# Patient Record
Sex: Female | Born: 1935
Health system: Southern US, Community
[De-identification: ages and names within clinical notes are randomized; demographics above are authoritative.]

## PROBLEM LIST (undated history)

## (undated) DIAGNOSIS — F0390 Unspecified dementia without behavioral disturbance: Secondary | ICD-10-CM

## (undated) DIAGNOSIS — I10 Essential (primary) hypertension: Secondary | ICD-10-CM

## (undated) DIAGNOSIS — F329 Major depressive disorder, single episode, unspecified: Secondary | ICD-10-CM

## (undated) DIAGNOSIS — H409 Unspecified glaucoma: Secondary | ICD-10-CM

## (undated) DIAGNOSIS — E785 Hyperlipidemia, unspecified: Secondary | ICD-10-CM

## (undated) DIAGNOSIS — F32A Depression, unspecified: Secondary | ICD-10-CM

## (undated) DIAGNOSIS — M810 Age-related osteoporosis without current pathological fracture: Secondary | ICD-10-CM

## (undated) HISTORY — PX: ABDOMINAL HYSTERECTOMY: SHX81

## (undated) HISTORY — PX: ANKLE ARTHROSCOPY: SUR85

## (undated) HISTORY — PX: EYE SURGERY: SHX253

## (undated) HISTORY — PX: CHOLECYSTECTOMY: SHX55

## (undated) HISTORY — PX: RECTOCELE REPAIR: SHX761

---

## 2000-06-02 ENCOUNTER — Other Ambulatory Visit: Admission: RE | Admit: 2000-06-02 | Discharge: 2000-06-02 | Payer: Self-pay | Admitting: Endocrinology

## 2001-07-06 ENCOUNTER — Other Ambulatory Visit: Admission: RE | Admit: 2001-07-06 | Discharge: 2001-07-06 | Payer: Self-pay | Admitting: Endocrinology

## 2008-08-04 ENCOUNTER — Encounter: Admission: RE | Admit: 2008-08-04 | Discharge: 2008-08-04 | Payer: Self-pay | Admitting: Endocrinology

## 2008-10-20 ENCOUNTER — Ambulatory Visit (HOSPITAL_COMMUNITY): Admission: RE | Admit: 2008-10-20 | Discharge: 2008-10-21 | Payer: Self-pay | Admitting: Surgery

## 2008-10-20 ENCOUNTER — Encounter (INDEPENDENT_AMBULATORY_CARE_PROVIDER_SITE_OTHER): Payer: Self-pay | Admitting: Surgery

## 2010-01-24 ENCOUNTER — Ambulatory Visit (HOSPITAL_COMMUNITY): Admission: RE | Admit: 2010-01-24 | Discharge: 2010-01-24 | Payer: Self-pay | Admitting: Orthopaedic Surgery

## 2010-04-27 ENCOUNTER — Emergency Department (HOSPITAL_COMMUNITY): Admission: EM | Admit: 2010-04-27 | Discharge: 2010-04-27 | Payer: Self-pay | Admitting: Emergency Medicine

## 2011-03-25 NOTE — Op Note (Signed)
Kendra Smith, Kendra Smith            ACCOUNT NO.:  [id number]   MEDICAL RECORD NO.:  [id number]          PATIENT TYPE:  AMB   LOCATION:  DAY                          FACILITY:  [facility]   PHYSICIAN:  Velora Heckler, MD      DATE OF BIRTH:  1926-07-17   DATE OF PROCEDURE:  10/20/2008  DATE OF DISCHARGE:                               OPERATIVE REPORT   PREOPERATIVE DIAGNOSIS:  Symptomatic cholelithiasis.   POSTOPERATIVE DIAGNOSES:  Symptomatic cholelithiasis.   PROCEDURE:  Laparoscopic cholecystectomy with intraoperative  cholangiography.   SURGEON:  Velora Heckler, MD, FACS   ASSISTANT:  Harriette Bouillon, MD, FACS   ANESTHESIA:  General per Dr. Sherrian Divers.   ESTIMATED BLOOD LOSS:  Minimal.   PREPARATION:  Betadine.   COMPLICATIONS:  None.   INDICATIONS:  The patient is a 77 year old white female from [place],  [place].  She presents with a 82-month history of intermittent  abdominal pain.  This has been associated with nausea and vomiting.  The  patient underwent evaluation by Dr. Adela Lank.  This included an  abdominal ultrasound on August 04, 2008 at [facility],  documenting multiple gallstones.  The patient now presents for  cholecystectomy.   BODY OF REPORT:  Procedure was done in OR #6 at the [facility].  The patient is brought to the operating room,  placed in supine position on the operating room table.  Following  administration of general anesthesia the patient is positioned and then  prepped and draped in usual strict aseptic fashion.  After ascertaining  that an adequate level of anesthesia been achieved, an infraumbilical  incision is made in the midline with a #15 blade.  Dissection is carried  down to the fascia.  Fascia is incised in the midline and the peritoneal  cavity is entered cautiously.  A 0 Vicryl pursestring suture is placed  in the fascia.  A Hasson cannula is introduced and secured with the  pursestring  suture.  The abdomen is insufflated with carbon dioxide.  Laparoscope is introduced and the abdomen explored.  Operative ports are  placed along the right costal margin in the midline, midclavicular line,  and anterior axillary line.  Fundus of the gallbladder is grasped and  retracted cephalad.  Omental adhesions and duodenal adhesions to the  undersurface of the gallbladder are taken down gently and the  electrocautery used for hemostasis.  The entire undersurface of the  gallbladder is cleared and exposed widely.  Peritoneum is incised at the  neck of the gallbladder.  Cystic duct is dissected out.  A clip is  placed at the neck of the gallbladder.  The cystic duct is incised.  A  Cook cholangiography catheter is introduced through a stab wound in the  right upper quadrant.  It is inserted into the cystic duct and secured  with a Ligaclip.  Using C-arm fluoroscopy, real-time cholangiography is  performed.  There is rapid filling of a normal-caliber common bile duct.  There is free flow distally into the duodenum.  There is reflux of  contrast into the right and  left hepatic ductal systems.  Clip is  withdrawn and Cook catheter is removed from the peritoneal cavity.   Cystic duct is doubly clipped and divided.  Branches of the cystic  artery are dissected out, doubly clipped and divided.  Gallbladder is  then excised from the gallbladder bed using the hook electrocautery for  hemostasis.  Gallbladder is completely excised and extracted through the  umbilical port without difficulty.  It contains multiple gallstones.  Gallbladder is submitted to pathology for review.   0-vicryl pursestring suture is tied securely at the umbilicus.  Right  upper quadrant is copiously irrigated with warm saline which is  evacuated.  Good hemostasis is noted.  Pneumoperitoneum is released and  ports are removed under direct vision.  Good hemostasis is noted at all  port sites.  Port sites are anesthetized  with local anesthetic.  Skin  wounds are closed with interrupted 4-0 Vicryl subcuticular sutures.  Wounds are washed and dried and Benzoin and Steri-Strips are applied.  Sterile dressings are applied.  The patient is awakened from anesthesia  and brought to the recovery room in stable condition.  The patient  tolerated the procedure well.      Velora Heckler, MD  Electronically Signed     TMG/MEDQ  D:  10/20/2008  T:  10/20/2008  Job:  [id number]   cc:   Brooke Bonito, M.D.  Fax: [phone]

## 2011-06-20 ENCOUNTER — Emergency Department (HOSPITAL_COMMUNITY): Payer: Medicare Other

## 2011-06-20 ENCOUNTER — Inpatient Hospital Stay (HOSPITAL_COMMUNITY)
Admission: EM | Admit: 2011-06-20 | Discharge: 2011-06-25 | DRG: 312 | Disposition: A | Discharge status: Skilled Nursing Facility | Payer: Medicare Other | Attending: Internal Medicine | Admitting: Internal Medicine

## 2011-06-20 DIAGNOSIS — R197 Diarrhea, unspecified: Secondary | ICD-10-CM | POA: Diagnosis present

## 2011-06-20 DIAGNOSIS — Z23 Encounter for immunization: Secondary | ICD-10-CM

## 2011-06-20 DIAGNOSIS — S82409A Unspecified fracture of shaft of unspecified fibula, initial encounter for closed fracture: Secondary | ICD-10-CM

## 2011-06-20 DIAGNOSIS — Z79899 Other long term (current) drug therapy: Secondary | ICD-10-CM

## 2011-06-20 DIAGNOSIS — M1990 Unspecified osteoarthritis, unspecified site: Secondary | ICD-10-CM | POA: Diagnosis present

## 2011-06-20 DIAGNOSIS — M6282 Rhabdomyolysis: Secondary | ICD-10-CM | POA: Diagnosis present

## 2011-06-20 DIAGNOSIS — W19XXXA Unspecified fall, initial encounter: Secondary | ICD-10-CM | POA: Diagnosis present

## 2011-06-20 DIAGNOSIS — M810 Age-related osteoporosis without current pathological fracture: Secondary | ICD-10-CM | POA: Diagnosis present

## 2011-06-20 DIAGNOSIS — S8253XA Displaced fracture of medial malleolus of unspecified tibia, initial encounter for closed fracture: Secondary | ICD-10-CM | POA: Diagnosis present

## 2011-06-20 DIAGNOSIS — T07XXXA Unspecified multiple injuries, initial encounter: Secondary | ICD-10-CM

## 2011-06-20 DIAGNOSIS — E860 Dehydration: Secondary | ICD-10-CM | POA: Diagnosis present

## 2011-06-20 DIAGNOSIS — N318 Other neuromuscular dysfunction of bladder: Secondary | ICD-10-CM | POA: Diagnosis present

## 2011-06-20 DIAGNOSIS — Z7982 Long term (current) use of aspirin: Secondary | ICD-10-CM

## 2011-06-20 DIAGNOSIS — E785 Hyperlipidemia, unspecified: Secondary | ICD-10-CM | POA: Diagnosis present

## 2011-06-20 DIAGNOSIS — S82839A Other fracture of upper and lower end of unspecified fibula, initial encounter for closed fracture: Secondary | ICD-10-CM | POA: Diagnosis present

## 2011-06-20 DIAGNOSIS — H409 Unspecified glaucoma: Secondary | ICD-10-CM | POA: Diagnosis present

## 2011-06-20 DIAGNOSIS — I4891 Unspecified atrial fibrillation: Secondary | ICD-10-CM | POA: Diagnosis present

## 2011-06-20 DIAGNOSIS — F341 Dysthymic disorder: Secondary | ICD-10-CM | POA: Diagnosis present

## 2011-06-20 DIAGNOSIS — F39 Unspecified mood [affective] disorder: Secondary | ICD-10-CM | POA: Diagnosis present

## 2011-06-20 DIAGNOSIS — H269 Unspecified cataract: Secondary | ICD-10-CM | POA: Diagnosis present

## 2011-06-20 DIAGNOSIS — R55 Syncope and collapse: Principal | ICD-10-CM | POA: Diagnosis present

## 2011-06-20 DIAGNOSIS — I10 Essential (primary) hypertension: Secondary | ICD-10-CM | POA: Diagnosis present

## 2011-06-20 DIAGNOSIS — R7989 Other specified abnormal findings of blood chemistry: Secondary | ICD-10-CM

## 2011-06-20 DIAGNOSIS — K5900 Constipation, unspecified: Secondary | ICD-10-CM | POA: Diagnosis not present

## 2011-06-20 DIAGNOSIS — F0390 Unspecified dementia without behavioral disturbance: Secondary | ICD-10-CM | POA: Diagnosis present

## 2011-06-20 LAB — URINALYSIS, ROUTINE W REFLEX MICROSCOPIC
Glucose, UA: NEGATIVE mg/dL
Ketones, ur: 40 mg/dL — AB
Leukocytes, UA: NEGATIVE
Nitrite: NEGATIVE
Protein, ur: 30 mg/dL — AB
Specific Gravity, Urine: 1.021 (ref 1.005–1.030)
Urobilinogen, UA: 0.2 mg/dL (ref 0.0–1.0)
pH: 5 (ref 5.0–8.0)

## 2011-06-20 LAB — PROTIME-INR
INR: 1.18 (ref 0.00–1.49)
Prothrombin Time: 15.2 seconds (ref 11.6–15.2)

## 2011-06-20 LAB — DIFFERENTIAL
Basophils Absolute: 0 10*3/uL (ref 0.0–0.1)
Basophils Relative: 0 % (ref 0–1)
Eosinophils Absolute: 0 10*3/uL (ref 0.0–0.7)
Eosinophils Relative: 0 % (ref 0–5)
Lymphocytes Relative: 7 % — ABNORMAL LOW (ref 12–46)
Lymphs Abs: 1.1 10*3/uL (ref 0.7–4.0)
Monocytes Absolute: 1 10*3/uL (ref 0.1–1.0)
Monocytes Relative: 7 % (ref 3–12)
Neutro Abs: 12.4 10*3/uL — ABNORMAL HIGH (ref 1.7–7.7)
Neutrophils Relative %: 86 % — ABNORMAL HIGH (ref 43–77)

## 2011-06-20 LAB — POCT I-STAT, CHEM 8
BUN: 24 mg/dL — ABNORMAL HIGH (ref 6–23)
Calcium, Ion: 1.22 mmol/L (ref 1.12–1.32)
Chloride: 102 mEq/L (ref 96–112)
Creatinine, Ser: 1 mg/dL (ref 0.50–1.10)
Glucose, Bld: 122 mg/dL — ABNORMAL HIGH (ref 70–99)
HCT: 40 % (ref 36.0–46.0)
Hemoglobin: 13.6 g/dL (ref 12.0–15.0)
Potassium: 4.1 mEq/L (ref 3.5–5.1)
Sodium: 137 mEq/L (ref 135–145)
TCO2: 26 mmol/L (ref 0–100)

## 2011-06-20 LAB — CBC
HCT: 37.8 % (ref 36.0–46.0)
Hemoglobin: 13.4 g/dL (ref 12.0–15.0)
MCH: 31.8 pg (ref 26.0–34.0)
MCHC: 35.4 g/dL (ref 30.0–36.0)
MCV: 89.6 fL (ref 78.0–100.0)
Platelets: 211 10*3/uL (ref 150–400)
RBC: 4.22 MIL/uL (ref 3.87–5.11)
RDW: 12.8 % (ref 11.5–15.5)
WBC: 14.5 10*3/uL — ABNORMAL HIGH (ref 4.0–10.5)

## 2011-06-20 LAB — TROPONIN I: Troponin I: <0.3 ng/mL (ref <0.30)

## 2011-06-20 LAB — URINE MICROSCOPIC-ADD ON

## 2011-06-20 LAB — POCT I-STAT TROPONIN I: Troponin i, poc: 0.11 ng/mL (ref 0.00–0.08)

## 2011-06-20 LAB — CK TOTAL AND CKMB (NOT AT ARMC)
CK, MB: 13.9 ng/mL (ref 0.3–4.0)
Relative Index: 0.7 (ref 0.0–2.5)
Total CK: 1856 U/L — ABNORMAL HIGH (ref 7–177)

## 2011-06-20 LAB — GLUCOSE, CAPILLARY: Glucose-Capillary: 118 mg/dL — ABNORMAL HIGH (ref 70–99)

## 2011-06-20 MED ORDER — IOHEXOL 300 MG/ML  SOLN
100.0000 mL | Freq: Once | INTRAMUSCULAR | Status: AC | PRN
  Administered 2011-06-20: 80 mL via INTRAVENOUS

## 2011-06-21 LAB — COMPREHENSIVE METABOLIC PANEL
ALT: 21 U/L (ref 0–35)
AST: 36 U/L (ref 0–37)
Albumin: 2.6 g/dL — ABNORMAL LOW (ref 3.5–5.2)
Alkaline Phosphatase: 49 U/L (ref 39–117)
BUN: 24 mg/dL — ABNORMAL HIGH (ref 6–23)
CO2: 25 mEq/L (ref 19–32)
Calcium: 8.5 mg/dL (ref 8.4–10.5)
Chloride: 106 mEq/L (ref 96–112)
Creatinine, Ser: 1.06 mg/dL (ref 0.50–1.10)
GFR calc Af Amer: >60 mL/min (ref >60)
GFR calc non Af Amer: 50 mL/min — ABNORMAL LOW (ref >60)
Glucose, Bld: 105 mg/dL — ABNORMAL HIGH (ref 70–99)
Potassium: 3.7 mEq/L (ref 3.5–5.1)
Sodium: 141 mEq/L (ref 135–145)
Total Bilirubin: 0.5 mg/dL (ref 0.3–1.2)
Total Protein: 5.8 g/dL — ABNORMAL LOW (ref 6.0–8.3)

## 2011-06-21 LAB — CBC
HCT: 35.7 % — ABNORMAL LOW (ref 36.0–46.0)
Hemoglobin: 12.1 g/dL (ref 12.0–15.0)
MCH: 30.6 pg (ref 26.0–34.0)
MCHC: 33.9 g/dL (ref 30.0–36.0)
MCV: 90.4 fL (ref 78.0–100.0)
Platelets: 188 10*3/uL (ref 150–400)
RBC: 3.95 MIL/uL (ref 3.87–5.11)
RDW: 13.1 % (ref 11.5–15.5)
WBC: 10.3 10*3/uL (ref 4.0–10.5)

## 2011-06-21 LAB — CK: Total CK: 593 U/L — ABNORMAL HIGH (ref 7–177)

## 2011-06-22 DIAGNOSIS — I059 Rheumatic mitral valve disease, unspecified: Secondary | ICD-10-CM

## 2011-06-22 DIAGNOSIS — R55 Syncope and collapse: Secondary | ICD-10-CM

## 2011-06-22 LAB — URINE MICROSCOPIC-ADD ON

## 2011-06-22 LAB — URINE CULTURE
Colony Count: >=100000
Culture  Setup Time: 201208101459

## 2011-06-22 LAB — URINALYSIS, ROUTINE W REFLEX MICROSCOPIC
Bilirubin Urine: NEGATIVE
Glucose, UA: NEGATIVE mg/dL
Ketones, ur: NEGATIVE mg/dL
Nitrite: POSITIVE — AB
Protein, ur: NEGATIVE mg/dL
Specific Gravity, Urine: 1.015 (ref 1.005–1.030)
Urobilinogen, UA: 1 mg/dL (ref 0.0–1.0)
pH: 5.5 (ref 5.0–8.0)

## 2011-06-22 LAB — TSH: TSH: 2.481 u[IU]/mL (ref 0.350–4.500)

## 2011-06-22 LAB — HEPARIN LEVEL (UNFRACTIONATED): Heparin Unfractionated: 0.3 [IU]/mL (ref 0.30–0.70)

## 2011-06-23 LAB — CBC
HCT: 30.7 % — ABNORMAL LOW (ref 36.0–46.0)
Hemoglobin: 10.4 g/dL — ABNORMAL LOW (ref 12.0–15.0)
MCH: 31.4 pg (ref 26.0–34.0)
MCHC: 33.9 g/dL (ref 30.0–36.0)
MCV: 92.7 fL (ref 78.0–100.0)
Platelets: 182 10*3/uL (ref 150–400)
RBC: 3.31 MIL/uL — ABNORMAL LOW (ref 3.87–5.11)
RDW: 13.4 % (ref 11.5–15.5)
WBC: 7.2 10*3/uL (ref 4.0–10.5)

## 2011-06-23 LAB — BASIC METABOLIC PANEL
BUN: 15 mg/dL (ref 6–23)
CO2: 29 mEq/L (ref 19–32)
Calcium: 8.9 mg/dL (ref 8.4–10.5)
Chloride: 105 mEq/L (ref 96–112)
Creatinine, Ser: 0.68 mg/dL (ref 0.50–1.10)
GFR calc Af Amer: >60 mL/min (ref >60)
GFR calc non Af Amer: >60 mL/min (ref >60)
Glucose, Bld: 107 mg/dL — ABNORMAL HIGH (ref 70–99)
Potassium: 4.1 mEq/L (ref 3.5–5.1)
Sodium: 139 mEq/L (ref 135–145)

## 2011-06-23 LAB — PROTIME-INR
INR: 1.18 (ref 0.00–1.49)
Prothrombin Time: 15.2 seconds (ref 11.6–15.2)

## 2011-06-23 LAB — HEPARIN LEVEL (UNFRACTIONATED)
Heparin Unfractionated: 0.26 IU/mL — ABNORMAL LOW (ref 0.30–0.70)
Heparin Unfractionated: 0.4 IU/mL (ref 0.30–0.70)

## 2011-06-24 LAB — CBC
HCT: 33.9 % — ABNORMAL LOW (ref 36.0–46.0)
Hemoglobin: 11.3 g/dL — ABNORMAL LOW (ref 12.0–15.0)
MCH: 30.5 pg (ref 26.0–34.0)
MCHC: 33.3 g/dL (ref 30.0–36.0)
MCV: 91.6 fL (ref 78.0–100.0)
Platelets: 207 10*3/uL (ref 150–400)
RBC: 3.7 MIL/uL — ABNORMAL LOW (ref 3.87–5.11)
RDW: 13.1 % (ref 11.5–15.5)
WBC: 7.4 10*3/uL (ref 4.0–10.5)

## 2011-06-24 LAB — HEPARIN LEVEL (UNFRACTIONATED)
Heparin Unfractionated: 0.14 IU/mL — ABNORMAL LOW (ref 0.30–0.70)
Heparin Unfractionated: 0.18 IU/mL — ABNORMAL LOW (ref 0.30–0.70)

## 2011-06-24 LAB — BASIC METABOLIC PANEL
BUN: 13 mg/dL (ref 6–23)
CO2: 31 mEq/L (ref 19–32)
Calcium: 9 mg/dL (ref 8.4–10.5)
Chloride: 103 mEq/L (ref 96–112)
Creatinine, Ser: 0.64 mg/dL (ref 0.50–1.10)
GFR calc Af Amer: >60 mL/min (ref >60)
GFR calc non Af Amer: >60 mL/min (ref >60)
Glucose, Bld: 121 mg/dL — ABNORMAL HIGH (ref 70–99)
Potassium: 3.4 mEq/L — ABNORMAL LOW (ref 3.5–5.1)
Sodium: 138 mEq/L (ref 135–145)

## 2011-06-24 LAB — PROTIME-INR
INR: 1.53 — ABNORMAL HIGH (ref 0.00–1.49)
Prothrombin Time: 18.7 seconds — ABNORMAL HIGH (ref 11.6–15.2)

## 2011-06-24 NOTE — Consult Note (Signed)
NAMEHELANA, Smith NO.:  [id number]  MEDICAL RECORD NO.:  [id number]  LOCATION:  3736                         FACILITY:  [facility]  PHYSICIAN:  Kendra Fells. Excell Seltzer, MD  DATE OF BIRTH:  [birth year]  DATE OF CONSULTATION:  06/20/2011 DATE OF DISCHARGE:                                CONSULTATION   PRIMARY CARE PHYSICIAN:  Kendra Earthly, MD  PRIMARY CARDIOLOGIST:  Kendra Hacking, MD  CHIEF COMPLAINT:  Syncope and elevated cardiac enzymes.  HISTORY OF PRESENT ILLNESS:  Kendra Smith is an 77 year old female with a history of a normal stress test several years ago by Dr. Donnie Smith.  She has not seen him in 2 or 3 years.  She came to the emergency room after being found by her family.  She had fallen and apparently had a syncopal episode.  She also had some elevated cardiac enzymes, so Cardiology was asked to evaluate her.  Kendra Smith describes dizziness and a fall last p.m.  She admits to a syncopal episode.  She has no history of syncope previously.  Her story is variable and it is known that she was found today in the kitchen by her family and had likely been there several hours.  It is not clear if she fell last night or this morning and it is not clear if she fell upstairs, downstairs, or down the stairs.  She has left lower extremity injuries and other musculoskeletal complaints without fracture.  She is currently not dizzy and is not having chest pain or palpitations.  She does not remember having chest pain or palpitations.  She has a long history of an irregular heart rate and her initial EKG was read as atrial fibrillation, but it is believed to be sinus rhythm with artifact.  Currently, she is resting comfortably, but has a great deal of soreness when she tries to move.  PAST MEDICAL HISTORY: 1. Hypertension. 2. Hyperlipidemia. 3. History of depression. 4. History of a stress test by Dr. Donnie Smith, several years ago that     daughter says was fine. 5.  History of memory problems.  SURGICAL HISTORY:  Cholecystectomy.  Knee surgery, hysterectomy, and cataract surgery.  ALLERGIES:  She is allergic or intolerant to CIPRO, EFFEXOR, SULFA, and GANTRISIN.  CURRENT MEDICATIONS: 1. Benicar. 2. Crestor. 3. Timoptic. 4. Cymbalta. 5. Trazodone. 6. Aspirin.  SOCIAL HISTORY:  She lives alone in a split-level home with family nearby who checks on her frequently.  She is a housewife.  She has no history of alcohol, tobacco, or drug abuse.  FAMILY HISTORY:  Her father died at 15 of cancer and her mother died in her 66s and neither of her parents nor siblings have known coronary artery disease.  REVIEW OF SYSTEMS:  The patient denies nausea and vomiting, but states she has had diarrhea for about 3 days.  She denies fevers or chills. She has arthralgias, joint pain, and some swelling in her left lower extremity since her injury.  It is not known if that leg was swelling before her injury.  She has problems with her memory.  She denies shortness of breath, chest pain, or palpitations.  Full 14-point review of systems is  otherwise negative except as stated in the HPI.  PHYSICAL EXAMINATION:  VITAL SIGNS:  Temperature is 98.4, blood pressure 128/101, heart rate 104, respiratory rate 18, O2 saturation 96% on room air.  GENERAL:  She is a slender, elderly white female in no acute distress. HEENT:  Normal with the exception of poor dentition. NECK:  There is no lymphadenopathy, thyromegaly, bruit, or JVD noted. CV:  Her heart is regular in rate and rhythm with an S1, S2 and no significant murmur, rub, or gallop is noted.  Distal pulses are intact in all four extremities. LUNGS:  Essentially clear to auscultation bilaterally. SKIN:  No rashes or lesions are noted. ABDOMEN:  Soft and nontender with active bowel sounds. EXTREMITIES:  There is no cyanosis or clubbing noted and she has mild left lower extremity edema. MUSCULOSKELETAL:  There is no  joint deformity or effusions noted, but she has tenderness to her left lower extremity. NEUROLOGIC:  She is alert and oriented with cranial nerves II-XII grossly intact.  Chest x-ray shows mild cardiomegaly without CHF or pneumonia.  Other x-rays show osteopenia, but no acute fracture except for the ankle x-ray which showed acute fracture with the medial malleolus with minimal displacement and soft tissue swelling as well as the knee x-ray which showed a nondisplaced proximal left fibular fracture.  CT of the head and C-spine as well as CT of the abdomen and pelvis also did not show an acute fracture.  There was a nonspecific fluid anterior to the sacrum and it was recommended that she get a followup sacral MRI with and without Gadolinium to assess.  CT angiogram of the chest showed no PE and peripherally calcified 1-cm left apical pulmonary nodule is probably scar tissue, but 3 months followup CT recommended.  EKG, sinus rhythm rate 105 with no acute ischemic changes.  LABORATORY VALUES:  Hemoglobin 13.4, hematocrit 37.8, WBC is 14.5, platelets 211.  Sodium 137, potassium 4.1, chloride 102, BUN 24, creatinine 1, glucose 122, CK-MB 1856/13.9 with an index of 0.7, and troponin on point-of-care initially 0.11, and less than 0.3 on repeat.  IMPRESSION:  Kendra Smith was seen by Dr. Excell Smith for Dr. Donnie Smith, the patient examined and the data reviewed.  She is an elderly female with syncope versus fall as we cannot be sure what happened.  She has also had pain control medications.  She was found in the floor by her daughter this a.m., but was awake and alert.  We were asked to see her because of increased cardiac markers and tachycardia in association with syncope.  She denies chest pain or dyspnea and has had no palpitations. Her exam is unremarkable.  Her x-rays show a left lower extremity fracture and her EKG shows sinus tachycardia.  We do not appreciate atrial dysrhythmia, but cannot  exclude atrial tach.  Her enzymes are consistent with rhabdomyolysis and not typical of cardiac injuries.  We recommend checking a 2-D echocardiogram and using IV fluids for hydration.  She needs clinical followup, but no other cardiac evaluation or treatment is indicated at this time.     Theodore Demark, PA-C   ______________________________ Kendra Fells. Excell Seltzer, MD    RB/MEDQ  D:  06/20/2011  T:  06/21/2011  Job:  [id number]  Electronically Signed by Theodore Demark PA-C on 06/23/2011 11:15:38 AM Electronically Signed by Tonny Bollman MD on 06/24/2011 05:28:36 AM

## 2011-06-24 NOTE — Consult Note (Signed)
NAMEMarland Smith  CLOTEE, SCHLICKER NO.:  [id number]  MEDICAL RECORD NO.:  [id number]  LOCATION:  3736                         FACILITY:  [facility]  PHYSICIAN:  Almond Lint, MD       DATE OF BIRTH:  Mar 17, 1926  DATE OF CONSULTATION:  06/20/2011 DATE OF DISCHARGE:                                CONSULTATION   CHIEF COMPLAINT:  Consult for C-spine pain.  HISTORY OF PRESENT ILLNESS:  Ms. Kendra Smith is an 77 year old female with recent episodes of diarrhea and syncope who passed out again today and fell on the stairs.  She was slightly confused and was in her usual state in the last couple days per her daughter.  The daughter could not reach the patient by phone and found the patient on the floor and full of diarrhea.  PAST MEDICAL HISTORY:  Significant for: 1. Hypertension. 2. Atrial fibrillation. 3. Depression.  PAST SURGICAL HISTORY: 1. Cholecystectomy. 2. Hysterectomy. 3. Cataract surgery. 4. Knee surgery.  SOCIAL HISTORY:  She does not use substances.  She lives alone in four- level home and does not work.  ALLERGIES: 1. CIPRO. 2. EFFEXOR. 3. SULFA.  MEDICATIONS:  Benicar, Crestor, Timoptic, Cymbalta, trazodone, and aspirin.  PRIMARY MD:  Larina Earthly, MD  REVIEW OF SYSTEMS:  Normal other than the dizziness, syncope, and presyncope.  PHYSICAL EXAMINATION:  VITAL SIGNS:  Temperature is normal, pulse 107, respiratory rate 15, blood pressure 141/65, sats 99% on room air. SKIN:  She has some faint mottling in the bilateral lower extremity. HEENT:  Head:  Posteriorly, there is a small scalp hematoma, otherwise her head is atraumatic.  Eyes; pupils are equal, round, and reactive to light.  Extraocular movements are intact.  Ears:  Both tympanic membranes are intact without evidence of hemotympanum.  Face is stable. NECK:  C-collar is removed.  The C-spine is nontender and she has reasonable range of motion for her age without pain. LUNGS:  Clear to auscultation  bilaterally. HEART:  Irregularly irregular.  Heart rate mildly elevated. ABDOMEN:  Soft, nontender, and nondistended.  Pelvis is stable. MUSCULOSKELETAL:  Tender left knee and left ankle. BACK:  Nontender. NEUROLOGIC:  She does not have any gross motor or sensory deficits, but does seem slightly disoriented with regard to several sequence.  LABORATORY STUDIES:  Sodium 137, potassium 4.1, chloride 102, CO2 22, BUN 24, creatinine 1, glucose 122.  White count 14.5, hemoglobin 13.4, hematocrit 37.8, and platelet count 211,000.  UA shows some ketones and protein as well small amount of urobilinogen.  PT and INR are 15.2 and 0.18.  EKG shows AFib.  Chest x-ray shows cardiomegaly.  Pelvis is negative for trauma.  She has left proximal fibular fracture and the left ankle fracture.  CT of the head is negative.  C-spine is negative.  Chest shows apical pleural- based nodule and then follow up in 3 months.  Abdomen; bilateral renal cysts extending from the cecum without evidence of fracture.  C-spine was cleared.  IMPRESSION:  This is an 77 year old female with status post fall with syncope, diarrhea, fibular fracture, and ankle fracture.  Her TLS and cervical spines are cleared as in the collar is removed by myself.  She  is going to be admitted to Dr. Felipa Eth for workup of her diarrhea and syncopal episodes.  Ortho is consulted for her left lower extremity injuries and Service will follow up with her on a p.r.n. basis.     Almond Lint, MD     FB/MEDQ  D:  06/20/2011  T:  06/21/2011  Job:  [id number]  Electronically Signed by Almond Lint MD on 06/24/2011 12:33:04 PM

## 2011-06-25 LAB — BASIC METABOLIC PANEL
BUN: 11 mg/dL (ref 6–23)
CO2: 27 mEq/L (ref 19–32)
Calcium: 9.1 mg/dL (ref 8.4–10.5)
Chloride: 104 mEq/L (ref 96–112)
Creatinine, Ser: 0.6 mg/dL (ref 0.50–1.10)
GFR calc Af Amer: >60 mL/min (ref >60)
GFR calc non Af Amer: >60 mL/min (ref >60)
Glucose, Bld: 107 mg/dL — ABNORMAL HIGH (ref 70–99)
Potassium: 3.7 mEq/L (ref 3.5–5.1)
Sodium: 138 mEq/L (ref 135–145)

## 2011-06-25 LAB — PROTIME-INR
INR: 2.71 — ABNORMAL HIGH (ref 0.00–1.49)
Prothrombin Time: 29.2 seconds — ABNORMAL HIGH (ref 11.6–15.2)

## 2011-06-25 LAB — CBC
HCT: 32.4 % — ABNORMAL LOW (ref 36.0–46.0)
Hemoglobin: 11 g/dL — ABNORMAL LOW (ref 12.0–15.0)
MCH: 31 pg (ref 26.0–34.0)
MCHC: 34 g/dL (ref 30.0–36.0)
MCV: 91.3 fL (ref 78.0–100.0)
Platelets: 203 10*3/uL (ref 150–400)
RBC: 3.55 MIL/uL — ABNORMAL LOW (ref 3.87–5.11)
RDW: 13.3 % (ref 11.5–15.5)
WBC: 10.5 10*3/uL (ref 4.0–10.5)

## 2011-06-25 LAB — HEPARIN LEVEL (UNFRACTIONATED)
Heparin Unfractionated: 0.43 IU/mL (ref 0.30–0.70)
Heparin Unfractionated: 0.4 IU/mL (ref 0.30–0.70)

## 2011-06-27 NOTE — H&P (Signed)
[name], OFFER NO.:  [id number]  MEDICAL RECORD NO.:  [id number]  LOCATION:  3736                         FACILITY:  [facility]  PHYSICIAN:  Geoffry Paradise, M.D.  DATE OF BIRTH:  [birth year]  DATE OF ADMISSION:  06/20/2011 DATE OF DISCHARGE:                             HISTORY & PHYSICAL   CHIEF COMPLAINT:  Found down.  HISTORY OF PRESENT ILLNESS:  Kendra Smith with a history that includes essential hypertension, hyperlipidemia, osteoporosis, chronic depression and memory loss presenting at this time via EMS after having been found down for at least half a day.  Her only prior cardiac history includes a stress test by Dr. Donnie Aho 2-3 years prior.  She has had no prior stroke or CNS history.  She has a very little recollection of the events.  The last recollection was being in bed, but evidently when family was unable to reach her by phone, they entered her house and found her on the floor of the kitchen, unable to get up.  She was awake and alert, but again unable to get up with no recollection of the hours that preceded this.  She does have several musculoskeletal aches and pains, most notably left lower extremity pain. She denies any seizure activity, incontinence, or tongue trauma.  She has had no chest pain or shortness of breath.  She has had no difficulty speaking, mentating, or moving her extremities beyond the limitations that the pain of the left lower extremity impacts.  In the emergency room, she was noted have an irregular heart rate, felt to be possibly atrial fibrillation, but Cardiology believes this is more sinus tach with atrial arrhythmias.  She has been seen by Trauma Service and Cardiology at the time of my assessment, but is to be admitted by [place] for further management.  PAST MEDICAL HISTORY:  Allergies/intolerances include: 1. SULFA. 2. CIPRO. 3. EFFEXOR. 4. GANTRISIN.  Medications include: 1. Benicar 40  mg daily. 2. Crestor 20 mg daily. 3. Calcium 600 b.i.d. 4. Glucosamine/chondroitin every day. 5. Aspirin 81 mg daily. 6. Diazepam 5 daily. 7. Timoptic 0.5% 1 drop left eye a.m. 8. Lumigan 0.03% 1 drop each eye at bedtime. 9. Trazodone 50 mg 2 nightly. 10.Vitamin D 50,000 units once weekly. 11.Cymbalta 60 mg daily.  Medical illnesses include: 1. Glaucoma. 2. Cataracts. 3. Osteoarthritis. 4. Hyperlipidemia. 5. Mood disorder. 6. Hypertension. 7. Memory loss.  Surgical illnesses include colonoscopy in 2008, nuclear stress test by Dr. Donnie Aho in 2008, cholecystectomy in 2010, cataract bilaterally in 2010, arthroscopy by Dr. Montez Morita, remote ankle and arm fractures, hysterectomy and rectocele in 1980.  FAMILY HISTORY:  Father is deceased with colon cancer.  Mother is deceased at 85 years of age.  Coronary artery disease.  Hypertension.  SOCIAL HISTORY:  The patient is retired, does not smoke or drink.  She is fairly sedentary, accompanied by her family.  PHYSICAL EXAMINATION:  VITAL SIGNS:  Temperature 98.4, blood pressure was 128/78, pulse is 116 and regular, respiratory rate is 18, and O2 sat 92 on room air. GENERAL:  The patient is awake, alert, conversant, and able to answer questions appropriately.  No memory of the events. HEENT:  Good  facial symmetry.  No trauma.  Pupils are round and reactive.  Oropharynx is benign. NECK:  No JVD or bruits. LUNGS:  Clear. CARDIOVASCULAR:  Regular rate and rhythm, tachycardic.  No obvious murmur. ABDOMEN:  Soft and nontender.  No hepatosplenomegaly. BACK:  Kyphotic.  No focal tenderness. EXTREMITIES:  Left lower extremity is immobilized in a splint.  Right lower extremity, bilateral upper extremities range of motion normal at all joints. SKIN:  Few ecchymoses. NEUROLOGICAL:  Higher cortical functioning is intact.  She is nonlateralizing.  Gait not assessed.  ASSESSMENT: 1. Syncope, clearly back to baseline mental status, events  totally     unclear.  Considerations include vascular event doubtful,     arrhythmia, vasovagal/volume, doubt seizure. 2. Left lower extremity malleolar and fibular fracture.  Ortho     pending. 3. Probable rhabdomyolysis, mildly elevated CKs, to be hydrated. 4. Essential hypertension. 5. Hyperlipidemia. 6. Anxiety/depression/dementia, all unchanged.  PLAN:  The patient is to be admitted for monitoring.  CNS workup. Hydration.  Ortho to review regarding the leg further pending her course.          ______________________________ Geoffry Paradise, M.D.     RA/MEDQ  D:  06/21/2011  T:  06/21/2011  Job:  [id number]  Electronically Signed by Geoffry Paradise M.D. on 06/27/2011 10:50:54 AM

## 2011-07-03 NOTE — Discharge Summary (Signed)
[name], Smith NO.:  [id number]  MEDICAL RECORD NO.:  [id number]  LOCATION:  3736                         FACILITY:  [facility]  PHYSICIAN:  Larina Earthly, M.D.        DATE OF BIRTH:  02/12/1926  DATE OF ADMISSION:  06/20/2011 DATE OF DISCHARGE:  06/25/2011                        DISCHARGE SUMMARY - REFERRING   DISCHARGE DIAGNOSES: 1. Syncope, presumably secondary to orthostasis and preceding     gastrointestinal illness with dehydration, workup unremarkable. 2. Paroxysmal atrial flutter/atrial fibrillation, now on     anticoagulation with Coumadin therapeutic with heparin bridge. 3. Left lower extremity malleolar and fibular fractures and fiberglass     cast __________. 4. Probable rhabdomyolysis given prolonged physician on floor resolved     with IV fluids. 5. Hypertension. 6. Hyperlipidemia. 7. Anxiety depression with mild dementia. 8. Overactive bladder. 9. Constipation.  SECONDARY DIAGNOSES: 1. Glaucoma. 2. Cataracts. 3. Osteoarthritis.  PAST SURGICAL HISTORY:  Includes a colonoscopy in 2008, nuclear stress test by Dr. Donnie Aho in 2008, cholecystectomy in 2010, cataract surgery bilaterally in 2010, arthroscopy by Dr. Montez Morita, and remote ankle and arm fractures, history of hysterectomy and rectocele in 1980.  DISCHARGE MEDICATIONS:  Please note that Benicar has been held.  Medications to be continued: 1. Crestor 20 mg daily. 2. Aspirin 81 mg daily. 3. Cymbalta 60 mg daily. 4. Digoxin 0.125 mg daily. 5. Timoptic eyedrops 0.5% daily to bilateral eyes. 6. Lumigan 0.03% ophthalmologic solution 1 drop to each eye at     bedtime. 7. Calcium with vitamin 3 daily. 8. Coumadin to be dosed by Coumadin protocol, but currently 4 mg with     rising PT/INR suggest 2 mg and recheck PT/INR on August 17. 9. MiraLax 17 g p.o. daily 10.Tylenol 650 mg q.6  h. p.r.n. 11.Ambien 5 mg p.o. nightly p.r.n. 12.Trazodone 50 mg two p.o. nightly.  INSTRUCTIONS TO  SKILLED NURSING FACILITY:  The patient will need physical and occupational therapy given physical deconditioning and left lower extremity fracture.  The patient is nonweightbearing as tolerated for 6 weeks from the day of injury with transfers to the wheelchair and foot elevated when possible.  The patient will need to return to office as with Dr. Ophelia Charter in 3-4 weeks.  Other issues are Coumadin per pharmacy protocol, possibly treatment for overactive bladder which is a significant concern to the patient and family especially to prior to transfer to home for additional outpatient rehab and questionable increase in Cymbalta for anxiety as needed.  DISCHARGE VITAL SIGNS:  Temperature 98 degrees Fahrenheit, pulse 91, respirations 18, blood pressure 163/83, oxygen saturation 95% on room air.  The patient eating 50 - 85% of her meal.  Discharge white blood cell count 10.5, hemoglobin 11.0, hematocrit 32.4%, platelet count 203.  PT/INR 2.71 increasing from 1.53 after receiving 4 mg of Coumadin.  Sodium 138, potassium 3.7, chloride 104, serum CO2 27, glucose 107, BUN 11, GFR greater than 60, calcium 9.1.  Other pertinent labs at admission hemoglobin 13.4 with a white blood cell count of 14.5.  The patient did have some transient hypokalemia, treated adequately. Admission BUN 24, creatinine 1.06, AST 36, ALT 21, alkaline phosphatase 49, total bilirubin 0.5, albumin 2.6.  Admission CK 1856 dropping to 593 with IV fluids and troponin I less than 0.3.  TSH 2.481.  Please note that echocardiogram obtained revealed an EF of 55 - 60%, systolic function was normal.  No regional wall motion abnormalities.  Question grade 1 diastolic dysfunction, mild mitral regurgitation.  Carotid ultrasound revealed minimal plaque, no ICA stenosis, vertebral artery flow was antegrade.  RADIOLOGY STUDIES:  CT of the abdomen and pelvis on the day of admission revealed nonspecific fluid anterior to the sacrum mostly  posttraumatic, no displaced sacral fracture, history of cholecystectomy and hysterectomy, bilateral renal cyst, moderate hiatal hernia.  CT angiogram of the chest was negative for a pulmonary embolism, calcified 1 cm left apical pulmonary nodule, probably representing scar, possible need of a 34-month follow up chest CT.  CT of the C-spine reveals no acute injury to the cervical spine.  CT of the head reveals no acute injury to the cervical spine.  Negative head CT.  L-spine osteopenia without definite acute findings from L3-L5, cannot exclude vertebral body height loss at L1-L2, left ankle reveals acute fracture of the medial malleolus with minimal displacement and soft tissue swelling. Left knee nondisplaced proximal left fibular fracture.  Pelvis: Osteopenia, no acute displaced fractures and chest x-ray reveals mild cardiomegaly without CHF or pneumonia.  HISTORY OF PRESENT ILLNESS:  Please see the history of physical dictated by Dr. Geoffry Paradise my partner for details, however, this is an 77- year-old Caucasian female with the above-mentioned medical problems who was found down for least half a day with unclear history.  However, now in retrospect, the patient states that she was feeling weak and lightheaded with preceding GI illness consisting of nausea, vomiting and diarrhea for multiple days and felt herself following and then does not remember anything afterwards, but did wake up at some point later and dragged herself given her left lower extremity difficulties down half a flight of stairs to the kitchen where she was found lying the next morning without having called anybody by her daughter.  She was in no apparent distress.  No difficulty speaking, mentating, removing extremities beyond the limitations of her left lower extremity.  In the emergency room, she was thought to have an irregular heart rate possibly atrial fibrillation and multiple consults were obtained from  Cardiology, Trauma Service and Orthopedics given her above-mentioned a elements.  HOSPITAL COURSE:  The patient did undergo further workup for her possible syncope.  This was presumed secondary to orthostasis and dehydration secondary to preceding GI illness which is resolved with IV fluids and conservative measures.  Carotid endarterectomy was unremarkable.  Echocardiogram was unremarkable and initial troponin I was unremarkable.  Cardiology did consult and followed throughout the hospitalization.  1. The patient was thought to have some paroxysmal atrial flutter,     atrial fibrillation and was initiated on heparin as well as     Coumadin per Cardiology with the need for follow up on an     outpatient basis.  She remained hemodynamically stable.  ARB was     discontinued given some hypotension initially. 2. Left lower extremity fractures as mentioned above, the patient was     casted in a fiberglass cast with the recommendations as mentioned     above by Dr. Ophelia Charter.  Pain control adequate 3. Hypertension slightly increased, may need a calcium channel blocker     and beta-blocker on outpatient basis if blood pressure continues to     rise or reinitiation of ARB. 4.  The patient was found to have some significant constipation,     started on MiraLax with good results. 5. Overactive bladder, chronic issue and we did discuss the possible     initiation of VESIcare or other suitable medication once     transferred to skilled nursing facility for the same. 6. Anxiety and depression, fairly well compensated, however, possibly     increased with current stressors.  Trazodone to be reinitiated and     as well as her current dose of Cymbalta 60 mg may need to consider     increasing Cymbalta dose.  Disposition will be to skilled nursing     facility for further rehabilitation efforts possibly prior to     transfer to [place] looked to be with her son for additional     endeavors to get her  living independently yet again.     Larina Earthly, M.D.     RA/MEDQ  D:  06/25/2011  T:  06/25/2011  Job:  [id number]  cc:   Georga Hacking, M.D. Veverly Fells. Ophelia Charter, M.D.  Electronically Signed by Larina Earthly M.D. on 07/03/2011 07:58:28 AM

## 2011-08-15 LAB — COMPREHENSIVE METABOLIC PANEL
ALT: 15 U/L (ref 0–35)
AST: 20 U/L (ref 0–37)
Albumin: 3.5 g/dL (ref 3.5–5.2)
Alkaline Phosphatase: 60 U/L (ref 39–117)
BUN: 18 mg/dL (ref 6–23)
CO2: 31 mEq/L (ref 19–32)
Calcium: 9.7 mg/dL (ref 8.4–10.5)
Chloride: 106 mEq/L (ref 96–112)
Creatinine, Ser: 1.09 mg/dL (ref 0.4–1.2)
GFR calc Af Amer: 58 mL/min — ABNORMAL LOW (ref >60)
GFR calc non Af Amer: 48 mL/min — ABNORMAL LOW (ref >60)
Glucose, Bld: 119 mg/dL — ABNORMAL HIGH (ref 70–99)
Potassium: 4.6 mEq/L (ref 3.5–5.1)
Sodium: 142 mEq/L (ref 135–145)
Total Bilirubin: 0.7 mg/dL (ref 0.3–1.2)
Total Protein: 7.2 g/dL (ref 6.0–8.3)

## 2011-08-15 LAB — URINALYSIS, ROUTINE W REFLEX MICROSCOPIC
Glucose, UA: NEGATIVE mg/dL
Hgb urine dipstick: NEGATIVE
Ketones, ur: NEGATIVE mg/dL
Nitrite: NEGATIVE
Protein, ur: NEGATIVE mg/dL
Specific Gravity, Urine: 1.022 (ref 1.005–1.030)
Urobilinogen, UA: 0.2 mg/dL (ref 0.0–1.0)
pH: 5.5 (ref 5.0–8.0)

## 2011-08-15 LAB — URINE MICROSCOPIC-ADD ON

## 2011-08-15 LAB — PROTIME-INR
INR: 1.1 (ref 0.00–1.49)
Prothrombin Time: 14 seconds (ref 11.6–15.2)

## 2011-08-15 LAB — DIFFERENTIAL
Basophils Absolute: 0 10*3/uL (ref 0.0–0.1)
Basophils Relative: 0 % (ref 0–1)
Eosinophils Absolute: 0.2 10*3/uL (ref 0.0–0.7)
Eosinophils Relative: 2 % (ref 0–5)
Lymphocytes Relative: 21 % (ref 12–46)
Lymphs Abs: 1.8 10*3/uL (ref 0.7–4.0)
Monocytes Absolute: 0.5 10*3/uL (ref 0.1–1.0)
Monocytes Relative: 6 % (ref 3–12)
Neutro Abs: 6.1 10*3/uL (ref 1.7–7.7)
Neutrophils Relative %: 71 % (ref 43–77)

## 2011-08-15 LAB — CBC
HCT: 38.3 % (ref 36.0–46.0)
Hemoglobin: 12.7 g/dL (ref 12.0–15.0)
MCHC: 33 g/dL (ref 30.0–36.0)
MCV: 91.3 fL (ref 78.0–100.0)
Platelets: 338 10*3/uL (ref 150–400)
RBC: 4.2 MIL/uL (ref 3.87–5.11)
RDW: 14.4 % (ref 11.5–15.5)
WBC: 8.7 10*3/uL (ref 4.0–10.5)

## 2011-11-21 DIAGNOSIS — I4891 Unspecified atrial fibrillation: Secondary | ICD-10-CM | POA: Diagnosis not present

## 2011-11-28 DIAGNOSIS — I4891 Unspecified atrial fibrillation: Secondary | ICD-10-CM | POA: Diagnosis not present

## 2011-12-30 DIAGNOSIS — Z7901 Long term (current) use of anticoagulants: Secondary | ICD-10-CM | POA: Diagnosis not present

## 2011-12-30 DIAGNOSIS — I4891 Unspecified atrial fibrillation: Secondary | ICD-10-CM | POA: Diagnosis not present

## 2011-12-30 DIAGNOSIS — E785 Hyperlipidemia, unspecified: Secondary | ICD-10-CM | POA: Diagnosis not present

## 2011-12-30 DIAGNOSIS — I10 Essential (primary) hypertension: Secondary | ICD-10-CM | POA: Diagnosis not present

## 2011-12-30 DIAGNOSIS — E559 Vitamin D deficiency, unspecified: Secondary | ICD-10-CM | POA: Diagnosis not present

## 2012-01-13 DIAGNOSIS — Z7901 Long term (current) use of anticoagulants: Secondary | ICD-10-CM | POA: Diagnosis not present

## 2012-01-13 DIAGNOSIS — I4891 Unspecified atrial fibrillation: Secondary | ICD-10-CM | POA: Diagnosis not present

## 2012-02-16 DIAGNOSIS — Z7901 Long term (current) use of anticoagulants: Secondary | ICD-10-CM | POA: Diagnosis not present

## 2012-02-16 DIAGNOSIS — I4891 Unspecified atrial fibrillation: Secondary | ICD-10-CM | POA: Diagnosis not present

## 2012-03-15 DIAGNOSIS — Z961 Presence of intraocular lens: Secondary | ICD-10-CM | POA: Diagnosis not present

## 2012-03-15 DIAGNOSIS — H4011X0 Primary open-angle glaucoma, stage unspecified: Secondary | ICD-10-CM | POA: Diagnosis not present

## 2012-03-15 DIAGNOSIS — H409 Unspecified glaucoma: Secondary | ICD-10-CM | POA: Diagnosis not present

## 2012-03-17 DIAGNOSIS — I4891 Unspecified atrial fibrillation: Secondary | ICD-10-CM | POA: Diagnosis not present

## 2012-03-17 DIAGNOSIS — Z7901 Long term (current) use of anticoagulants: Secondary | ICD-10-CM | POA: Diagnosis not present

## 2012-04-14 DIAGNOSIS — I4891 Unspecified atrial fibrillation: Secondary | ICD-10-CM | POA: Diagnosis not present

## 2012-04-14 DIAGNOSIS — Z7901 Long term (current) use of anticoagulants: Secondary | ICD-10-CM | POA: Diagnosis not present

## 2012-04-15 DIAGNOSIS — E785 Hyperlipidemia, unspecified: Secondary | ICD-10-CM | POA: Diagnosis not present

## 2012-04-15 DIAGNOSIS — F341 Dysthymic disorder: Secondary | ICD-10-CM | POA: Diagnosis not present

## 2012-04-15 DIAGNOSIS — I10 Essential (primary) hypertension: Secondary | ICD-10-CM | POA: Diagnosis not present

## 2012-04-15 DIAGNOSIS — F068 Other specified mental disorders due to known physiological condition: Secondary | ICD-10-CM | POA: Diagnosis not present

## 2012-04-26 DIAGNOSIS — Z7901 Long term (current) use of anticoagulants: Secondary | ICD-10-CM | POA: Diagnosis not present

## 2012-04-26 DIAGNOSIS — I4891 Unspecified atrial fibrillation: Secondary | ICD-10-CM | POA: Diagnosis not present

## 2012-05-06 DIAGNOSIS — Z7901 Long term (current) use of anticoagulants: Secondary | ICD-10-CM | POA: Diagnosis not present

## 2012-05-06 DIAGNOSIS — I4891 Unspecified atrial fibrillation: Secondary | ICD-10-CM | POA: Diagnosis not present

## 2012-05-28 DIAGNOSIS — Z7901 Long term (current) use of anticoagulants: Secondary | ICD-10-CM | POA: Diagnosis not present

## 2012-05-28 DIAGNOSIS — I4891 Unspecified atrial fibrillation: Secondary | ICD-10-CM | POA: Diagnosis not present

## 2012-06-28 DIAGNOSIS — Z7901 Long term (current) use of anticoagulants: Secondary | ICD-10-CM | POA: Diagnosis not present

## 2012-06-28 DIAGNOSIS — I4891 Unspecified atrial fibrillation: Secondary | ICD-10-CM | POA: Diagnosis not present

## 2012-07-29 DIAGNOSIS — I4891 Unspecified atrial fibrillation: Secondary | ICD-10-CM | POA: Diagnosis not present

## 2012-07-29 DIAGNOSIS — Z7901 Long term (current) use of anticoagulants: Secondary | ICD-10-CM | POA: Diagnosis not present

## 2012-08-31 DIAGNOSIS — I4891 Unspecified atrial fibrillation: Secondary | ICD-10-CM | POA: Diagnosis not present

## 2012-08-31 DIAGNOSIS — Z7901 Long term (current) use of anticoagulants: Secondary | ICD-10-CM | POA: Diagnosis not present

## 2012-09-13 DIAGNOSIS — H52209 Unspecified astigmatism, unspecified eye: Secondary | ICD-10-CM | POA: Diagnosis not present

## 2012-09-13 DIAGNOSIS — Z961 Presence of intraocular lens: Secondary | ICD-10-CM | POA: Diagnosis not present

## 2012-09-13 DIAGNOSIS — H4011X0 Primary open-angle glaucoma, stage unspecified: Secondary | ICD-10-CM | POA: Diagnosis not present

## 2012-09-13 DIAGNOSIS — H409 Unspecified glaucoma: Secondary | ICD-10-CM | POA: Diagnosis not present

## 2012-09-21 DIAGNOSIS — Z79899 Other long term (current) drug therapy: Secondary | ICD-10-CM | POA: Diagnosis not present

## 2012-09-21 DIAGNOSIS — I4891 Unspecified atrial fibrillation: Secondary | ICD-10-CM | POA: Diagnosis not present

## 2012-09-21 DIAGNOSIS — Z7901 Long term (current) use of anticoagulants: Secondary | ICD-10-CM | POA: Diagnosis not present

## 2012-10-12 DIAGNOSIS — R82998 Other abnormal findings in urine: Secondary | ICD-10-CM | POA: Diagnosis not present

## 2012-10-12 DIAGNOSIS — I4891 Unspecified atrial fibrillation: Secondary | ICD-10-CM | POA: Diagnosis not present

## 2012-10-12 DIAGNOSIS — I10 Essential (primary) hypertension: Secondary | ICD-10-CM | POA: Diagnosis not present

## 2012-10-12 DIAGNOSIS — E785 Hyperlipidemia, unspecified: Secondary | ICD-10-CM | POA: Diagnosis not present

## 2012-10-12 DIAGNOSIS — Z7901 Long term (current) use of anticoagulants: Secondary | ICD-10-CM | POA: Diagnosis not present

## 2012-10-12 DIAGNOSIS — E559 Vitamin D deficiency, unspecified: Secondary | ICD-10-CM | POA: Diagnosis not present

## 2012-10-12 DIAGNOSIS — R42 Dizziness and giddiness: Secondary | ICD-10-CM | POA: Diagnosis not present

## 2012-11-19 DIAGNOSIS — Z7901 Long term (current) use of anticoagulants: Secondary | ICD-10-CM | POA: Diagnosis not present

## 2012-11-19 DIAGNOSIS — I4891 Unspecified atrial fibrillation: Secondary | ICD-10-CM | POA: Diagnosis not present

## 2012-12-06 DIAGNOSIS — Z7901 Long term (current) use of anticoagulants: Secondary | ICD-10-CM | POA: Diagnosis not present

## 2012-12-06 DIAGNOSIS — I4891 Unspecified atrial fibrillation: Secondary | ICD-10-CM | POA: Diagnosis not present

## 2013-01-05 DIAGNOSIS — Z7901 Long term (current) use of anticoagulants: Secondary | ICD-10-CM | POA: Diagnosis not present

## 2013-01-05 DIAGNOSIS — I4891 Unspecified atrial fibrillation: Secondary | ICD-10-CM | POA: Diagnosis not present

## 2013-02-03 DIAGNOSIS — Z7901 Long term (current) use of anticoagulants: Secondary | ICD-10-CM | POA: Diagnosis not present

## 2013-02-03 DIAGNOSIS — I4891 Unspecified atrial fibrillation: Secondary | ICD-10-CM | POA: Diagnosis not present

## 2013-02-10 DIAGNOSIS — F068 Other specified mental disorders due to known physiological condition: Secondary | ICD-10-CM | POA: Diagnosis not present

## 2013-02-10 DIAGNOSIS — Z1331 Encounter for screening for depression: Secondary | ICD-10-CM | POA: Diagnosis not present

## 2013-02-10 DIAGNOSIS — E559 Vitamin D deficiency, unspecified: Secondary | ICD-10-CM | POA: Diagnosis not present

## 2013-02-10 DIAGNOSIS — R269 Unspecified abnormalities of gait and mobility: Secondary | ICD-10-CM | POA: Diagnosis not present

## 2013-02-10 DIAGNOSIS — N318 Other neuromuscular dysfunction of bladder: Secondary | ICD-10-CM | POA: Diagnosis not present

## 2013-02-10 DIAGNOSIS — I4891 Unspecified atrial fibrillation: Secondary | ICD-10-CM | POA: Diagnosis not present

## 2013-02-10 DIAGNOSIS — Z7901 Long term (current) use of anticoagulants: Secondary | ICD-10-CM | POA: Diagnosis not present

## 2013-03-09 DIAGNOSIS — Z7901 Long term (current) use of anticoagulants: Secondary | ICD-10-CM | POA: Diagnosis not present

## 2013-03-09 DIAGNOSIS — I4891 Unspecified atrial fibrillation: Secondary | ICD-10-CM | POA: Diagnosis not present

## 2013-03-23 ENCOUNTER — Other Ambulatory Visit: Payer: Self-pay | Admitting: Ophthalmology

## 2013-03-23 DIAGNOSIS — Z961 Presence of intraocular lens: Secondary | ICD-10-CM | POA: Diagnosis not present

## 2013-03-23 DIAGNOSIS — H4011X0 Primary open-angle glaucoma, stage unspecified: Secondary | ICD-10-CM | POA: Diagnosis not present

## 2013-03-23 DIAGNOSIS — H409 Unspecified glaucoma: Secondary | ICD-10-CM | POA: Diagnosis not present

## 2013-03-23 DIAGNOSIS — H5340 Unspecified visual field defects: Secondary | ICD-10-CM

## 2013-03-29 ENCOUNTER — Ambulatory Visit
Admission: RE | Admit: 2013-03-29 | Discharge: 2013-03-29 | Disposition: A | Discharge status: Home / Self Care | Payer: Medicare Other | Source: Ambulatory Visit | Attending: Ophthalmology | Admitting: Ophthalmology

## 2013-03-29 DIAGNOSIS — H5340 Unspecified visual field defects: Secondary | ICD-10-CM | POA: Diagnosis not present

## 2013-03-29 MED ORDER — GADOBENATE DIMEGLUMINE 529 MG/ML IV SOLN
11.0000 mL | Freq: Once | INTRAVENOUS | Status: AC | PRN
  Administered 2013-03-29: 11 mL via INTRAVENOUS

## 2013-04-13 DIAGNOSIS — I4891 Unspecified atrial fibrillation: Secondary | ICD-10-CM | POA: Diagnosis not present

## 2013-04-13 DIAGNOSIS — Z7901 Long term (current) use of anticoagulants: Secondary | ICD-10-CM | POA: Diagnosis not present

## 2013-05-05 DIAGNOSIS — Z7901 Long term (current) use of anticoagulants: Secondary | ICD-10-CM | POA: Diagnosis not present

## 2013-05-05 DIAGNOSIS — I4891 Unspecified atrial fibrillation: Secondary | ICD-10-CM | POA: Diagnosis not present

## 2013-06-07 DIAGNOSIS — I4891 Unspecified atrial fibrillation: Secondary | ICD-10-CM | POA: Diagnosis not present

## 2013-06-07 DIAGNOSIS — Z7901 Long term (current) use of anticoagulants: Secondary | ICD-10-CM | POA: Diagnosis not present

## 2013-07-07 DIAGNOSIS — N318 Other neuromuscular dysfunction of bladder: Secondary | ICD-10-CM | POA: Diagnosis not present

## 2013-07-07 DIAGNOSIS — Z7901 Long term (current) use of anticoagulants: Secondary | ICD-10-CM | POA: Diagnosis not present

## 2013-07-07 DIAGNOSIS — I4891 Unspecified atrial fibrillation: Secondary | ICD-10-CM | POA: Diagnosis not present

## 2013-07-26 DIAGNOSIS — Z7901 Long term (current) use of anticoagulants: Secondary | ICD-10-CM | POA: Diagnosis not present

## 2013-07-26 DIAGNOSIS — I4891 Unspecified atrial fibrillation: Secondary | ICD-10-CM | POA: Diagnosis not present

## 2013-07-27 DIAGNOSIS — H4011X0 Primary open-angle glaucoma, stage unspecified: Secondary | ICD-10-CM | POA: Diagnosis not present

## 2013-07-27 DIAGNOSIS — H409 Unspecified glaucoma: Secondary | ICD-10-CM | POA: Diagnosis not present

## 2013-07-27 DIAGNOSIS — H01009 Unspecified blepharitis unspecified eye, unspecified eyelid: Secondary | ICD-10-CM | POA: Diagnosis not present

## 2013-08-03 DIAGNOSIS — R269 Unspecified abnormalities of gait and mobility: Secondary | ICD-10-CM | POA: Diagnosis not present

## 2013-08-03 DIAGNOSIS — R634 Abnormal weight loss: Secondary | ICD-10-CM | POA: Diagnosis not present

## 2013-08-03 DIAGNOSIS — D649 Anemia, unspecified: Secondary | ICD-10-CM | POA: Diagnosis not present

## 2013-08-03 DIAGNOSIS — M810 Age-related osteoporosis without current pathological fracture: Secondary | ICD-10-CM | POA: Diagnosis not present

## 2013-08-03 DIAGNOSIS — E785 Hyperlipidemia, unspecified: Secondary | ICD-10-CM | POA: Diagnosis not present

## 2013-08-03 DIAGNOSIS — I10 Essential (primary) hypertension: Secondary | ICD-10-CM | POA: Diagnosis not present

## 2013-08-10 DIAGNOSIS — I10 Essential (primary) hypertension: Secondary | ICD-10-CM | POA: Diagnosis not present

## 2013-08-10 DIAGNOSIS — D649 Anemia, unspecified: Secondary | ICD-10-CM | POA: Diagnosis not present

## 2013-08-10 DIAGNOSIS — R040 Epistaxis: Secondary | ICD-10-CM | POA: Diagnosis not present

## 2013-08-10 DIAGNOSIS — M810 Age-related osteoporosis without current pathological fracture: Secondary | ICD-10-CM | POA: Diagnosis not present

## 2013-08-10 DIAGNOSIS — N318 Other neuromuscular dysfunction of bladder: Secondary | ICD-10-CM | POA: Diagnosis not present

## 2013-08-10 DIAGNOSIS — I4891 Unspecified atrial fibrillation: Secondary | ICD-10-CM | POA: Diagnosis not present

## 2013-08-10 DIAGNOSIS — Z23 Encounter for immunization: Secondary | ICD-10-CM | POA: Diagnosis not present

## 2013-08-10 DIAGNOSIS — I6350 Cerebral infarction due to unspecified occlusion or stenosis of unspecified cerebral artery: Secondary | ICD-10-CM | POA: Diagnosis not present

## 2013-08-10 DIAGNOSIS — R7301 Impaired fasting glucose: Secondary | ICD-10-CM | POA: Diagnosis not present

## 2013-08-10 DIAGNOSIS — Z7901 Long term (current) use of anticoagulants: Secondary | ICD-10-CM | POA: Diagnosis not present

## 2013-08-25 DIAGNOSIS — I4891 Unspecified atrial fibrillation: Secondary | ICD-10-CM | POA: Diagnosis not present

## 2013-08-25 DIAGNOSIS — I6350 Cerebral infarction due to unspecified occlusion or stenosis of unspecified cerebral artery: Secondary | ICD-10-CM | POA: Diagnosis not present

## 2013-08-25 DIAGNOSIS — Z7901 Long term (current) use of anticoagulants: Secondary | ICD-10-CM | POA: Diagnosis not present

## 2013-09-13 DIAGNOSIS — Z7901 Long term (current) use of anticoagulants: Secondary | ICD-10-CM | POA: Diagnosis not present

## 2013-09-13 DIAGNOSIS — I4891 Unspecified atrial fibrillation: Secondary | ICD-10-CM | POA: Diagnosis not present

## 2013-10-19 DIAGNOSIS — Z7901 Long term (current) use of anticoagulants: Secondary | ICD-10-CM | POA: Diagnosis not present

## 2013-10-19 DIAGNOSIS — I4891 Unspecified atrial fibrillation: Secondary | ICD-10-CM | POA: Diagnosis not present

## 2013-10-19 DIAGNOSIS — I6350 Cerebral infarction due to unspecified occlusion or stenosis of unspecified cerebral artery: Secondary | ICD-10-CM | POA: Diagnosis not present

## 2013-11-09 DIAGNOSIS — Z7901 Long term (current) use of anticoagulants: Secondary | ICD-10-CM | POA: Diagnosis not present

## 2013-11-09 DIAGNOSIS — I4891 Unspecified atrial fibrillation: Secondary | ICD-10-CM | POA: Diagnosis not present

## 2013-11-09 DIAGNOSIS — I6350 Cerebral infarction due to unspecified occlusion or stenosis of unspecified cerebral artery: Secondary | ICD-10-CM | POA: Diagnosis not present

## 2013-11-30 DIAGNOSIS — I4891 Unspecified atrial fibrillation: Secondary | ICD-10-CM | POA: Diagnosis not present

## 2013-11-30 DIAGNOSIS — I6350 Cerebral infarction due to unspecified occlusion or stenosis of unspecified cerebral artery: Secondary | ICD-10-CM | POA: Diagnosis not present

## 2013-11-30 DIAGNOSIS — Z7901 Long term (current) use of anticoagulants: Secondary | ICD-10-CM | POA: Diagnosis not present

## 2013-12-16 DIAGNOSIS — I10 Essential (primary) hypertension: Secondary | ICD-10-CM | POA: Diagnosis not present

## 2013-12-16 DIAGNOSIS — R7301 Impaired fasting glucose: Secondary | ICD-10-CM | POA: Diagnosis not present

## 2013-12-16 DIAGNOSIS — I4891 Unspecified atrial fibrillation: Secondary | ICD-10-CM | POA: Diagnosis not present

## 2013-12-16 DIAGNOSIS — F068 Other specified mental disorders due to known physiological condition: Secondary | ICD-10-CM | POA: Diagnosis not present

## 2013-12-16 DIAGNOSIS — Z6825 Body mass index (BMI) 25.0-25.9, adult: Secondary | ICD-10-CM | POA: Diagnosis not present

## 2013-12-16 DIAGNOSIS — Z7901 Long term (current) use of anticoagulants: Secondary | ICD-10-CM | POA: Diagnosis not present

## 2014-01-11 DIAGNOSIS — Z7901 Long term (current) use of anticoagulants: Secondary | ICD-10-CM | POA: Diagnosis not present

## 2014-01-11 DIAGNOSIS — I4891 Unspecified atrial fibrillation: Secondary | ICD-10-CM | POA: Diagnosis not present

## 2014-02-08 DIAGNOSIS — Z7901 Long term (current) use of anticoagulants: Secondary | ICD-10-CM | POA: Diagnosis not present

## 2014-02-08 DIAGNOSIS — I4891 Unspecified atrial fibrillation: Secondary | ICD-10-CM | POA: Diagnosis not present

## 2014-02-08 DIAGNOSIS — I6350 Cerebral infarction due to unspecified occlusion or stenosis of unspecified cerebral artery: Secondary | ICD-10-CM | POA: Diagnosis not present

## 2014-03-01 DIAGNOSIS — H52209 Unspecified astigmatism, unspecified eye: Secondary | ICD-10-CM | POA: Diagnosis not present

## 2014-03-01 DIAGNOSIS — Z961 Presence of intraocular lens: Secondary | ICD-10-CM | POA: Diagnosis not present

## 2014-03-01 DIAGNOSIS — H409 Unspecified glaucoma: Secondary | ICD-10-CM | POA: Diagnosis not present

## 2014-03-01 DIAGNOSIS — H4011X0 Primary open-angle glaucoma, stage unspecified: Secondary | ICD-10-CM | POA: Diagnosis not present

## 2014-03-09 DIAGNOSIS — Z7901 Long term (current) use of anticoagulants: Secondary | ICD-10-CM | POA: Diagnosis not present

## 2014-03-09 DIAGNOSIS — I6350 Cerebral infarction due to unspecified occlusion or stenosis of unspecified cerebral artery: Secondary | ICD-10-CM | POA: Diagnosis not present

## 2014-03-09 DIAGNOSIS — I4891 Unspecified atrial fibrillation: Secondary | ICD-10-CM | POA: Diagnosis not present

## 2014-04-13 DIAGNOSIS — I6350 Cerebral infarction due to unspecified occlusion or stenosis of unspecified cerebral artery: Secondary | ICD-10-CM | POA: Diagnosis not present

## 2014-04-13 DIAGNOSIS — I4891 Unspecified atrial fibrillation: Secondary | ICD-10-CM | POA: Diagnosis not present

## 2014-04-13 DIAGNOSIS — Z7901 Long term (current) use of anticoagulants: Secondary | ICD-10-CM | POA: Diagnosis not present

## 2014-05-25 DIAGNOSIS — Z7901 Long term (current) use of anticoagulants: Secondary | ICD-10-CM | POA: Diagnosis not present

## 2014-05-25 DIAGNOSIS — I4891 Unspecified atrial fibrillation: Secondary | ICD-10-CM | POA: Diagnosis not present

## 2014-05-25 DIAGNOSIS — I6350 Cerebral infarction due to unspecified occlusion or stenosis of unspecified cerebral artery: Secondary | ICD-10-CM | POA: Diagnosis not present

## 2014-06-15 DIAGNOSIS — M810 Age-related osteoporosis without current pathological fracture: Secondary | ICD-10-CM | POA: Diagnosis not present

## 2014-06-15 DIAGNOSIS — R82998 Other abnormal findings in urine: Secondary | ICD-10-CM | POA: Diagnosis not present

## 2014-06-15 DIAGNOSIS — E785 Hyperlipidemia, unspecified: Secondary | ICD-10-CM | POA: Diagnosis not present

## 2014-06-15 DIAGNOSIS — I6350 Cerebral infarction due to unspecified occlusion or stenosis of unspecified cerebral artery: Secondary | ICD-10-CM | POA: Diagnosis not present

## 2014-06-15 DIAGNOSIS — I10 Essential (primary) hypertension: Secondary | ICD-10-CM | POA: Diagnosis not present

## 2014-06-15 DIAGNOSIS — R269 Unspecified abnormalities of gait and mobility: Secondary | ICD-10-CM | POA: Diagnosis not present

## 2014-06-15 DIAGNOSIS — I4891 Unspecified atrial fibrillation: Secondary | ICD-10-CM | POA: Diagnosis not present

## 2014-06-15 DIAGNOSIS — Z7901 Long term (current) use of anticoagulants: Secondary | ICD-10-CM | POA: Diagnosis not present

## 2014-06-15 DIAGNOSIS — R7301 Impaired fasting glucose: Secondary | ICD-10-CM | POA: Diagnosis not present

## 2014-06-15 DIAGNOSIS — E559 Vitamin D deficiency, unspecified: Secondary | ICD-10-CM | POA: Diagnosis not present

## 2014-06-15 DIAGNOSIS — R809 Proteinuria, unspecified: Secondary | ICD-10-CM | POA: Diagnosis not present

## 2014-06-28 ENCOUNTER — Emergency Department (HOSPITAL_COMMUNITY)
Admission: EM | Admit: 2014-06-28 | Discharge: 2014-06-29 | Disposition: A | Discharge status: Home / Self Care | Payer: Medicare Other | Attending: Emergency Medicine | Admitting: Emergency Medicine

## 2014-06-28 ENCOUNTER — Encounter (HOSPITAL_COMMUNITY): Payer: Self-pay | Admitting: Emergency Medicine

## 2014-06-28 DIAGNOSIS — Z8739 Personal history of other diseases of the musculoskeletal system and connective tissue: Secondary | ICD-10-CM | POA: Diagnosis not present

## 2014-06-28 DIAGNOSIS — H409 Unspecified glaucoma: Secondary | ICD-10-CM | POA: Diagnosis not present

## 2014-06-28 DIAGNOSIS — R0789 Other chest pain: Secondary | ICD-10-CM | POA: Diagnosis not present

## 2014-06-28 DIAGNOSIS — Z8639 Personal history of other endocrine, nutritional and metabolic disease: Secondary | ICD-10-CM | POA: Insufficient documentation

## 2014-06-28 DIAGNOSIS — Z7901 Long term (current) use of anticoagulants: Secondary | ICD-10-CM | POA: Insufficient documentation

## 2014-06-28 DIAGNOSIS — J189 Pneumonia, unspecified organism: Secondary | ICD-10-CM | POA: Diagnosis not present

## 2014-06-28 DIAGNOSIS — R059 Cough, unspecified: Secondary | ICD-10-CM | POA: Diagnosis not present

## 2014-06-28 DIAGNOSIS — R0602 Shortness of breath: Secondary | ICD-10-CM | POA: Diagnosis not present

## 2014-06-28 DIAGNOSIS — Z79899 Other long term (current) drug therapy: Secondary | ICD-10-CM | POA: Diagnosis not present

## 2014-06-28 DIAGNOSIS — F3289 Other specified depressive episodes: Secondary | ICD-10-CM | POA: Insufficient documentation

## 2014-06-28 DIAGNOSIS — R61 Generalized hyperhidrosis: Secondary | ICD-10-CM | POA: Insufficient documentation

## 2014-06-28 DIAGNOSIS — I10 Essential (primary) hypertension: Secondary | ICD-10-CM | POA: Diagnosis not present

## 2014-06-28 DIAGNOSIS — R079 Chest pain, unspecified: Secondary | ICD-10-CM | POA: Diagnosis not present

## 2014-06-28 DIAGNOSIS — F329 Major depressive disorder, single episode, unspecified: Secondary | ICD-10-CM | POA: Diagnosis not present

## 2014-06-28 DIAGNOSIS — Z792 Long term (current) use of antibiotics: Secondary | ICD-10-CM | POA: Insufficient documentation

## 2014-06-28 DIAGNOSIS — Z862 Personal history of diseases of the blood and blood-forming organs and certain disorders involving the immune mechanism: Secondary | ICD-10-CM | POA: Insufficient documentation

## 2014-06-28 DIAGNOSIS — J449 Chronic obstructive pulmonary disease, unspecified: Secondary | ICD-10-CM | POA: Diagnosis not present

## 2014-06-28 DIAGNOSIS — J159 Unspecified bacterial pneumonia: Secondary | ICD-10-CM | POA: Diagnosis not present

## 2014-06-28 DIAGNOSIS — R05 Cough: Secondary | ICD-10-CM | POA: Diagnosis not present

## 2014-06-28 HISTORY — DX: Depression, unspecified: F32.A

## 2014-06-28 HISTORY — DX: Hyperlipidemia, unspecified: E78.5

## 2014-06-28 HISTORY — DX: Age-related osteoporosis without current pathological fracture: M81.0

## 2014-06-28 HISTORY — DX: Major depressive disorder, single episode, unspecified: F32.9

## 2014-06-28 HISTORY — DX: Unspecified glaucoma: H40.9

## 2014-06-28 HISTORY — DX: Essential (primary) hypertension: I10

## 2014-06-28 LAB — BASIC METABOLIC PANEL
Anion gap: 13 (ref 5–15)
BUN: 16 mg/dL (ref 6–23)
CO2: 26 mEq/L (ref 19–32)
Calcium: 8.9 mg/dL (ref 8.4–10.5)
Chloride: 98 mEq/L (ref 96–112)
Creatinine, Ser: 0.99 mg/dL (ref 0.50–1.10)
GFR calc Af Amer: 59 mL/min — ABNORMAL LOW (ref >90)
GFR calc non Af Amer: 51 mL/min — ABNORMAL LOW (ref >90)
Glucose, Bld: 150 mg/dL — ABNORMAL HIGH (ref 70–99)
Potassium: 4.3 mEq/L (ref 3.7–5.3)
Sodium: 137 mEq/L (ref 137–147)

## 2014-06-28 LAB — CBC
HCT: 35 % — ABNORMAL LOW (ref 36.0–46.0)
Hemoglobin: 11.4 g/dL — ABNORMAL LOW (ref 12.0–15.0)
MCH: 29.5 pg (ref 26.0–34.0)
MCHC: 32.6 g/dL (ref 30.0–36.0)
MCV: 90.7 fL (ref 78.0–100.0)
Platelets: 305 10*3/uL (ref 150–400)
RBC: 3.86 MIL/uL — ABNORMAL LOW (ref 3.87–5.11)
RDW: 15 % (ref 11.5–15.5)
WBC: 8 10*3/uL (ref 4.0–10.5)

## 2014-06-28 LAB — I-STAT TROPONIN, ED: Troponin i, poc: 0 ng/mL (ref 0.00–0.08)

## 2014-06-28 LAB — PRO B NATRIURETIC PEPTIDE: Pro B Natriuretic peptide (BNP): 111.4 pg/mL (ref 0–450)

## 2014-06-28 LAB — PROTIME-INR
INR: 1.86 — ABNORMAL HIGH (ref 0.00–1.49)
Prothrombin Time: 21.4 seconds — ABNORMAL HIGH (ref 11.6–15.2)

## 2014-06-28 MED ORDER — ASPIRIN 81 MG PO CHEW
324.0000 mg | CHEWABLE_TABLET | Freq: Once | ORAL | Status: AC
  Administered 2014-06-29: 324 mg via ORAL
  Filled 2014-06-28: qty 4

## 2014-06-28 NOTE — ED Notes (Addendum)
Pt reports having chest pressure for a few hours and has not lightened up; pt c/o sob; pt states she is on coumadin

## 2014-06-29 ENCOUNTER — Emergency Department (HOSPITAL_COMMUNITY): Payer: Medicare Other

## 2014-06-29 DIAGNOSIS — R0789 Other chest pain: Secondary | ICD-10-CM | POA: Diagnosis not present

## 2014-06-29 DIAGNOSIS — J449 Chronic obstructive pulmonary disease, unspecified: Secondary | ICD-10-CM | POA: Diagnosis not present

## 2014-06-29 DIAGNOSIS — R079 Chest pain, unspecified: Secondary | ICD-10-CM | POA: Diagnosis not present

## 2014-06-29 DIAGNOSIS — R0602 Shortness of breath: Secondary | ICD-10-CM | POA: Diagnosis not present

## 2014-06-29 LAB — I-STAT TROPONIN, ED: Troponin i, poc: 0 ng/mL (ref 0.00–0.08)

## 2014-06-29 MED ORDER — LEVOFLOXACIN IN D5W 500 MG/100ML IV SOLN
500.0000 mg | Freq: Once | INTRAVENOUS | Status: AC
  Administered 2014-06-29: 500 mg via INTRAVENOUS
  Filled 2014-06-29: qty 100

## 2014-06-29 MED ORDER — LEVOFLOXACIN 500 MG PO TABS
500.0000 mg | ORAL_TABLET | Freq: Every day | ORAL | Status: DC
Start: 2014-06-29 — End: 2019-08-23

## 2014-06-29 NOTE — ED Notes (Signed)
Pt ambulated to  the bathroom with cane

## 2014-06-29 NOTE — Discharge Instructions (Signed)
Chest Pain (Nonspecific) °It is often hard to give a specific diagnosis for the cause of chest pain. There is always a chance that your pain could be related to something serious, such as a heart attack or a blood clot in the lungs. You need to follow up with your health care provider for further evaluation. °CAUSES  °· Heartburn. °· Pneumonia or bronchitis. °· Anxiety or stress. °· Inflammation around your heart (pericarditis) or lung (pleuritis or pleurisy). °· A blood clot in the lung. °· A collapsed lung (pneumothorax). It can develop suddenly on its own (spontaneous pneumothorax) or from trauma to the chest. °· Shingles infection (herpes zoster virus). °The chest wall is composed of bones, muscles, and cartilage. Any of these can be the source of the pain. °· The bones can be bruised by injury. °· The muscles or cartilage can be strained by coughing or overwork. °· The cartilage can be affected by inflammation and become sore (costochondritis). °DIAGNOSIS  °Lab tests or other studies may be needed to find the cause of your pain. Your health care provider may have you take a test called an ambulatory electrocardiogram (ECG). An ECG records your heartbeat patterns over a 24-hour period. You may also have other tests, such as: °· Transthoracic echocardiogram (TTE). During echocardiography, sound waves are used to evaluate how blood flows through your heart. °· Transesophageal echocardiogram (TEE). °· Cardiac monitoring. This allows your health care provider to monitor your heart rate and rhythm in real time. °· Holter monitor. This is a portable device that records your heartbeat and can help diagnose heart arrhythmias. It allows your health care provider to track your heart activity for several days, if needed. °· Stress tests by exercise or by giving medicine that makes the heart beat faster. °TREATMENT  °· Treatment depends on what may be causing your chest pain. Treatment may include: °· Acid blockers for  heartburn. °· Anti-inflammatory medicine. °· Pain medicine for inflammatory conditions. °· Antibiotics if an infection is present. °· You may be advised to change lifestyle habits. This includes stopping smoking and avoiding alcohol, caffeine, and chocolate. °· You may be advised to keep your head raised (elevated) when sleeping. This reduces the chance of acid going backward from your stomach into your esophagus. °Most of the time, nonspecific chest pain will improve within 2-3 days with rest and mild pain medicine.  °HOME CARE INSTRUCTIONS  °· If antibiotics were prescribed, take them as directed. Finish them even if you start to feel better. °· For the next few days, avoid physical activities that bring on chest pain. Continue physical activities as directed. °· Do not use any tobacco products, including cigarettes, chewing tobacco, or electronic cigarettes. °· Avoid drinking alcohol. °· Only take medicine as directed by your health care provider. °· Follow your health care provider's suggestions for further testing if your chest pain does not go away. °· Keep any follow-up appointments you made. If you do not go to an appointment, you could develop lasting (chronic) problems with pain. If there is any problem keeping an appointment, call to reschedule. °SEEK MEDICAL CARE IF:  °· Your chest pain does not go away, even after treatment. °· You have a rash with blisters on your chest. °· You have a fever. °SEEK IMMEDIATE MEDICAL CARE IF:  °· You have increased chest pain or pain that spreads to your arm, neck, jaw, back, or abdomen. °· You have shortness of breath. °· You have an increasing cough, or you cough   up blood. °· You have severe back or abdominal pain. °· You feel nauseous or vomit. °· You have severe weakness. °· You faint. °· You have chills. °This is an emergency. Do not wait to see if the pain will go away. Get medical help at once. Call your local emergency services (911 in [place] Do not drive  yourself to the hospital. °MAKE SURE YOU:  °· Understand these instructions. °· Will watch your condition. °· Will get help right away if you are not doing well or get worse. °Document Released: 08/06/2005 Document Revised: 11/01/2013 Document Reviewed: 06/01/2008 °ExitCare® Patient Information ©2015 [employer]. This information is not intended to replace advice given to you by your health care provider. Make sure you discuss any questions you have with your health care provider. °Pneumonia °Pneumonia is an infection of the lungs.  °CAUSES °Pneumonia may be caused by bacteria or a virus. Usually, these infections are caused by breathing infectious particles into the lungs (respiratory tract). °SIGNS AND SYMPTOMS  °· Cough. °· Fever. °· Chest pain. °· Increased rate of breathing. °· Wheezing. °· Mucus production. °DIAGNOSIS  °If you have the common symptoms of pneumonia, your health care provider will typically confirm the diagnosis with a chest X-ray. The X-ray will show an abnormality in the lung (pulmonary infiltrate) if you have pneumonia. Other tests of your blood, urine, or sputum may be done to find the specific cause of your pneumonia. Your health care provider may also do tests (blood gases or pulse oximetry) to see how well your lungs are working. °TREATMENT  °Some forms of pneumonia may be spread to other people when you cough or sneeze. You may be asked to wear a mask before and during your exam. Pneumonia that is caused by bacteria is treated with antibiotic medicine. Pneumonia that is caused by the influenza virus may be treated with an antiviral medicine. Most other viral infections must run their course. These infections will not respond to antibiotics.  °HOME CARE INSTRUCTIONS  °· Cough suppressants may be used if you are losing too much rest. However, coughing protects you by clearing your lungs. You should avoid using cough suppressants if you can. °· Your health care provider may have  prescribed medicine if he or she thinks your pneumonia is caused by bacteria or influenza. Finish your medicine even if you start to feel better. °· Your health care provider may also prescribe an expectorant. This loosens the mucus to be coughed up. °· Take medicines only as directed by your health care provider. °· Do not smoke. Smoking is a common cause of bronchitis and can contribute to pneumonia. If you are a smoker and continue to smoke, your cough may last several weeks after your pneumonia has cleared. °· A cold steam vaporizer or humidifier in your room or home may help loosen mucus. °· Coughing is often worse at night. Sleeping in a semi-upright position in a recliner or using a couple pillows under your head will help with this. °· Get rest as you feel it is needed. Your body will usually let you know when you need to rest. °PREVENTION °A pneumococcal shot (vaccine) is available to prevent a common bacterial cause of pneumonia. This is usually suggested for: °· People over 65 years old. °· Patients on chemotherapy. °· People with chronic lung problems, such as bronchitis or emphysema. °· People with immune system problems. °If you are over 65 or have a high risk condition, you may receive the pneumococcal vaccine   if you have not received it before. In some countries, a routine influenza vaccine is also recommended. This vaccine can help prevent some cases of pneumonia. You may be offered the influenza vaccine as part of your care. °If you smoke, it is time to quit. You may receive instructions on how to stop smoking. Your health care provider can provide medicines and counseling to help you quit. °SEEK MEDICAL CARE IF: °You have a fever. °SEEK IMMEDIATE MEDICAL CARE IF:  °· Your illness becomes worse. This is especially true if you are elderly or weakened from any other disease. °· You cannot control your cough with suppressants and are losing sleep. °· You begin coughing up blood. °· You develop pain  which is getting worse or is uncontrolled with medicines. °· Any of the symptoms which initially brought you in for treatment are getting worse rather than better. °· You develop shortness of breath or chest pain. °MAKE SURE YOU:  °· Understand these instructions. °· Will watch your condition. °· Will get help right away if you are not doing well or get worse. °Document Released: 10/27/2005 Document Revised: 03/13/2014 Document Reviewed: 01/16/2011 °ExitCare® Patient Information ©2015 [employer]. This information is not intended to replace advice given to you by your health care provider. Make sure you discuss any questions you have with your health care provider. ° °

## 2014-06-29 NOTE — ED Notes (Signed)
Daughter here to get pt and take her back home daughter understands all d/c instructions and rx

## 2014-06-29 NOTE — ED Provider Notes (Signed)
CSN: [id number]     Arrival date & time 06/28/14  2006 History   First MD Initiated Contact with Patient 06/28/14 2319     Chief Complaint  Patient presents with  . Chest Pain     (Consider location/radiation/quality/duration/timing/severity/associated sxs/prior Treatment) HPI Patient is a 79 year old female with a history of hypertension, hyperlipidemia, atrial fibrillation who presents with gradual onset central chest pressure starting this afternoon around 3 PM-4 PM associated with mild shortness of breath and cough. No nausea or vomiting. Patient does admit to diaphoresis. She's had no increased lower extremity swelling of late. She denies fever or chills. Is followed by Dr. Donnie Aho. Patient states her chest pain is now resolved completely.  Past Medical History  Diagnosis Date  . Hypertension   . Hyperlipidemia   . Glaucoma   . Depression   . Osteoporosis    Past Surgical History  Procedure Laterality Date  . Cholecystectomy    . Abdominal hysterectomy    . Eye surgery    . Ankle arthroscopy    . Rectocele repair     History reviewed. No pertinent family history. History  Substance Use Topics  . Smoking status: Never Smoker   . Smokeless tobacco: Not on file  . Alcohol Use: No   OB History   Grav Para Term Preterm Abortions TAB SAB Ect Mult Living                 Review of Systems  Constitutional: Positive for diaphoresis. Negative for fever and chills.  Respiratory: Positive for cough and shortness of breath. Negative for wheezing.   Cardiovascular: Positive for chest pain. Negative for palpitations and leg swelling.  Gastrointestinal: Negative for nausea, vomiting, abdominal pain and diarrhea.  Musculoskeletal: Negative for back pain, myalgias, neck pain and neck stiffness.  Skin: Negative for rash and wound.  Neurological: Negative for dizziness, weakness, light-headedness, numbness and headaches.  All other systems reviewed and are negative.     Allergies   Sulfa antibiotics  Home Medications   Prior to Admission medications   Medication Sig Start Date End Date Taking? Authorizing Provider  digoxin (LANOXIN) 0.125 MG tablet Take 0.125 mg by mouth daily.   Yes Historical Provider, MD  DULoxetine (CYMBALTA) 60 MG capsule Take 60 mg by mouth daily. 05/22/14  Yes Historical Provider, MD  nitrofurantoin, macrocrystal-monohydrate, (MACROBID) 100 MG capsule Take 100 mg by mouth 2 (two) times daily. 06/23/14  Yes Historical Provider, MD  traZODone (DESYREL) 100 MG tablet Take 100 mg by mouth daily. 05/09/14  Yes Historical Provider, MD  warfarin (COUMADIN) 2 MG tablet Take 2 mg by mouth daily. 04/13/14  Yes Historical Provider, MD  LUMIGAN 0.01 % SOLN Place 1 drop into both eyes daily. 06/12/14   Historical Provider, MD  timolol (TIMOPTIC) 0.5 % ophthalmic solution Place 1 drop into both eyes daily. 06/15/14   Historical Provider, MD   BP 135/51  Pulse 86  Temp(Src) 98.6 F (37 C) (Oral)  Resp 23  Ht 5\' 2"  (1.575 m)  Wt 130 lb (58.968 kg)  BMI 23.77 kg/m2  SpO2 98% Physical Exam  Nursing note and vitals reviewed. Constitutional: She is oriented to person, place, and time. She appears well-developed and well-nourished. No distress.  HENT:  Head: Normocephalic and atraumatic.  Mouth/Throat: Oropharynx is clear and moist.  Eyes: EOM are normal. Pupils are equal, round, and reactive to light.  Neck: Normal range of motion. Neck supple.  Cardiovascular: Normal rate and regular rhythm.   Pulmonary/Chest:  Effort normal and breath sounds normal. No respiratory distress. She has no wheezes. She has no rales. She exhibits no tenderness.  Abdominal: Soft. Bowel sounds are normal. She exhibits no distension and no mass. There is no tenderness. There is no rebound and no guarding.  Musculoskeletal: Normal range of motion. She exhibits no edema and no tenderness.  No calf swelling or tenderness.  Neurological: She is alert and oriented to person, place, and  time.  Moves all extremities without deficit. Sensation is grossly intact.  Skin: Skin is warm and dry. No rash noted. No erythema.  Psychiatric: She has a normal mood and affect. Her behavior is normal.    ED Course  Procedures (including critical care time) Labs Review Labs Reviewed  CBC - Abnormal; Notable for the following:    RBC 3.86 (*)    Hemoglobin 11.4 (*)    HCT 35.0 (*)    All other components within normal limits  BASIC METABOLIC PANEL - Abnormal; Notable for the following:    Glucose, Bld 150 (*)    GFR calc non Af Amer 51 (*)    GFR calc Af Amer 59 (*)    All other components within normal limits  PROTIME-INR - Abnormal; Notable for the following:    Prothrombin Time 21.4 (*)    INR 1.86 (*)    All other components within normal limits  PRO B NATRIURETIC PEPTIDE  I-STAT TROPOININ, ED  I-STAT TROPOININ, ED    Imaging Review Dg Chest 2 View  06/29/2014   CLINICAL DATA:  Chest pain, shortness of breath.  EXAM: CHEST  2 VIEW  COMPARISON:  CT of the chest June 20, 2011  FINDINGS: The cardiac silhouette appears mildly enlarged. Mildly calcified aortic knob. Mild chronic interstitial changes, increased lung volumes, patchy airspace opacity left lung base. No pleural effusions. No pneumothorax.  Patient is osteopenic.  Soft tissue planes are nonsuspicious.  IMPRESSION: Mild cardiomegaly. COPD with left lung base airspace opacity concerning for atelectasis or pneumonia. Recommend followup chest radiograph after treatment to verify improvement.   Electronically Signed   By: Awilda Metroourtnay  Bloomer   On: 06/29/2014 00:43     EKG Interpretation None      MDM   Final diagnoses:  None    Repeat troponin in the emergency department was normal. She remained chest pain-free with normal vital signs. Infiltrate concerning for pneumonia. Was treated with Levaquin. Discussed with Dr. Tresa EndoKelly of cardiology. He does not believe further in the workup is needed for her chest pain. He  thinks the patient can be safely be discharged home to followup with cardiology with standard return precautions.  Patient is in no respiratory distress. Her vital signs remained stable. Patient will need close followup by her primary physician. She's been given extensive return precautions as was understanding.    Loren Raceravid Lira Stephen, MD 06/29/14 [phone]

## 2014-06-29 NOTE — ED Notes (Signed)
Pt has Depends from home that she is wearing and does not want to be in the hospital briefs.  Family wants nursing staff made aware.  Pt is sleeping with her cane in the bed so she does not loose it.  Pt is alert and oriented.

## 2014-06-29 NOTE — ED Notes (Signed)
Phoned daughter regarding pt care and discharge.  Per daughter, will take 45 minutes for her to come and get her mother.  Charge made aware.

## 2014-07-04 DIAGNOSIS — F068 Other specified mental disorders due to known physiological condition: Secondary | ICD-10-CM | POA: Diagnosis not present

## 2014-07-04 DIAGNOSIS — Z7901 Long term (current) use of anticoagulants: Secondary | ICD-10-CM | POA: Diagnosis not present

## 2014-07-04 DIAGNOSIS — Z6827 Body mass index (BMI) 27.0-27.9, adult: Secondary | ICD-10-CM | POA: Diagnosis not present

## 2014-07-04 DIAGNOSIS — I4891 Unspecified atrial fibrillation: Secondary | ICD-10-CM | POA: Diagnosis not present

## 2014-07-04 DIAGNOSIS — J189 Pneumonia, unspecified organism: Secondary | ICD-10-CM | POA: Diagnosis not present

## 2014-07-04 DIAGNOSIS — I10 Essential (primary) hypertension: Secondary | ICD-10-CM | POA: Diagnosis not present

## 2014-07-04 DIAGNOSIS — T887XXA Unspecified adverse effect of drug or medicament, initial encounter: Secondary | ICD-10-CM | POA: Diagnosis not present

## 2014-08-01 DIAGNOSIS — Z7901 Long term (current) use of anticoagulants: Secondary | ICD-10-CM | POA: Diagnosis not present

## 2014-08-01 DIAGNOSIS — I4891 Unspecified atrial fibrillation: Secondary | ICD-10-CM | POA: Diagnosis not present

## 2014-08-01 DIAGNOSIS — J189 Pneumonia, unspecified organism: Secondary | ICD-10-CM | POA: Diagnosis not present

## 2014-09-05 DIAGNOSIS — H4011X2 Primary open-angle glaucoma, moderate stage: Secondary | ICD-10-CM | POA: Diagnosis not present

## 2014-09-05 DIAGNOSIS — Z961 Presence of intraocular lens: Secondary | ICD-10-CM | POA: Diagnosis not present

## 2014-09-06 DIAGNOSIS — Z23 Encounter for immunization: Secondary | ICD-10-CM | POA: Diagnosis not present

## 2014-09-06 DIAGNOSIS — I480 Paroxysmal atrial fibrillation: Secondary | ICD-10-CM | POA: Diagnosis not present

## 2014-09-06 DIAGNOSIS — I639 Cerebral infarction, unspecified: Secondary | ICD-10-CM | POA: Diagnosis not present

## 2014-09-06 DIAGNOSIS — Z7901 Long term (current) use of anticoagulants: Secondary | ICD-10-CM | POA: Diagnosis not present

## 2014-09-22 DIAGNOSIS — Z6828 Body mass index (BMI) 28.0-28.9, adult: Secondary | ICD-10-CM | POA: Diagnosis not present

## 2014-09-22 DIAGNOSIS — I480 Paroxysmal atrial fibrillation: Secondary | ICD-10-CM | POA: Diagnosis not present

## 2014-09-22 DIAGNOSIS — R600 Localized edema: Secondary | ICD-10-CM | POA: Diagnosis not present

## 2014-09-22 DIAGNOSIS — R0600 Dyspnea, unspecified: Secondary | ICD-10-CM | POA: Diagnosis not present

## 2014-10-12 DIAGNOSIS — I480 Paroxysmal atrial fibrillation: Secondary | ICD-10-CM | POA: Diagnosis not present

## 2014-10-12 DIAGNOSIS — Z7901 Long term (current) use of anticoagulants: Secondary | ICD-10-CM | POA: Diagnosis not present

## 2014-10-12 DIAGNOSIS — I639 Cerebral infarction, unspecified: Secondary | ICD-10-CM | POA: Diagnosis not present

## 2014-11-02 DIAGNOSIS — R8299 Other abnormal findings in urine: Secondary | ICD-10-CM | POA: Diagnosis not present

## 2014-11-02 DIAGNOSIS — Z7901 Long term (current) use of anticoagulants: Secondary | ICD-10-CM | POA: Diagnosis not present

## 2014-11-02 DIAGNOSIS — F0390 Unspecified dementia without behavioral disturbance: Secondary | ICD-10-CM | POA: Diagnosis not present

## 2014-11-02 DIAGNOSIS — N3090 Cystitis, unspecified without hematuria: Secondary | ICD-10-CM | POA: Diagnosis not present

## 2014-11-02 DIAGNOSIS — R627 Adult failure to thrive: Secondary | ICD-10-CM | POA: Diagnosis not present

## 2014-11-02 DIAGNOSIS — R32 Unspecified urinary incontinence: Secondary | ICD-10-CM | POA: Diagnosis not present

## 2014-11-02 DIAGNOSIS — F418 Other specified anxiety disorders: Secondary | ICD-10-CM | POA: Diagnosis not present

## 2014-11-02 DIAGNOSIS — R410 Disorientation, unspecified: Secondary | ICD-10-CM | POA: Diagnosis not present

## 2014-11-02 DIAGNOSIS — I480 Paroxysmal atrial fibrillation: Secondary | ICD-10-CM | POA: Diagnosis not present

## 2014-11-09 DIAGNOSIS — Z6830 Body mass index (BMI) 30.0-30.9, adult: Secondary | ICD-10-CM | POA: Diagnosis not present

## 2014-11-09 DIAGNOSIS — R600 Localized edema: Secondary | ICD-10-CM | POA: Diagnosis not present

## 2014-11-24 DIAGNOSIS — R32 Unspecified urinary incontinence: Secondary | ICD-10-CM | POA: Diagnosis not present

## 2014-11-24 DIAGNOSIS — Z7901 Long term (current) use of anticoagulants: Secondary | ICD-10-CM | POA: Diagnosis not present

## 2014-11-24 DIAGNOSIS — R0600 Dyspnea, unspecified: Secondary | ICD-10-CM | POA: Diagnosis not present

## 2014-11-24 DIAGNOSIS — R600 Localized edema: Secondary | ICD-10-CM | POA: Diagnosis not present

## 2014-11-24 DIAGNOSIS — Z6828 Body mass index (BMI) 28.0-28.9, adult: Secondary | ICD-10-CM | POA: Diagnosis not present

## 2014-11-24 DIAGNOSIS — I480 Paroxysmal atrial fibrillation: Secondary | ICD-10-CM | POA: Diagnosis not present

## 2014-12-27 DIAGNOSIS — I480 Paroxysmal atrial fibrillation: Secondary | ICD-10-CM | POA: Diagnosis not present

## 2014-12-27 DIAGNOSIS — Z7901 Long term (current) use of anticoagulants: Secondary | ICD-10-CM | POA: Diagnosis not present

## 2015-01-19 DIAGNOSIS — Z7901 Long term (current) use of anticoagulants: Secondary | ICD-10-CM | POA: Diagnosis not present

## 2015-01-19 DIAGNOSIS — I480 Paroxysmal atrial fibrillation: Secondary | ICD-10-CM | POA: Diagnosis not present

## 2015-02-06 DIAGNOSIS — Z7901 Long term (current) use of anticoagulants: Secondary | ICD-10-CM | POA: Diagnosis not present

## 2015-02-06 DIAGNOSIS — I480 Paroxysmal atrial fibrillation: Secondary | ICD-10-CM | POA: Diagnosis not present

## 2015-02-27 DIAGNOSIS — H524 Presbyopia: Secondary | ICD-10-CM | POA: Diagnosis not present

## 2015-02-27 DIAGNOSIS — H4011X2 Primary open-angle glaucoma, moderate stage: Secondary | ICD-10-CM | POA: Diagnosis not present

## 2015-02-27 DIAGNOSIS — Z961 Presence of intraocular lens: Secondary | ICD-10-CM | POA: Diagnosis not present

## 2015-03-08 DIAGNOSIS — Z7901 Long term (current) use of anticoagulants: Secondary | ICD-10-CM | POA: Diagnosis not present

## 2015-03-08 DIAGNOSIS — I480 Paroxysmal atrial fibrillation: Secondary | ICD-10-CM | POA: Diagnosis not present

## 2015-04-05 DIAGNOSIS — Z7901 Long term (current) use of anticoagulants: Secondary | ICD-10-CM | POA: Diagnosis not present

## 2015-04-05 DIAGNOSIS — I480 Paroxysmal atrial fibrillation: Secondary | ICD-10-CM | POA: Diagnosis not present

## 2015-05-15 DIAGNOSIS — Z7901 Long term (current) use of anticoagulants: Secondary | ICD-10-CM | POA: Diagnosis not present

## 2015-05-15 DIAGNOSIS — I480 Paroxysmal atrial fibrillation: Secondary | ICD-10-CM | POA: Diagnosis not present

## 2015-06-26 DIAGNOSIS — Z7901 Long term (current) use of anticoagulants: Secondary | ICD-10-CM | POA: Diagnosis not present

## 2015-06-26 DIAGNOSIS — I480 Paroxysmal atrial fibrillation: Secondary | ICD-10-CM | POA: Diagnosis not present

## 2015-07-26 DIAGNOSIS — E785 Hyperlipidemia, unspecified: Secondary | ICD-10-CM | POA: Diagnosis not present

## 2015-07-26 DIAGNOSIS — M810 Age-related osteoporosis without current pathological fracture: Secondary | ICD-10-CM | POA: Diagnosis not present

## 2015-07-26 DIAGNOSIS — I10 Essential (primary) hypertension: Secondary | ICD-10-CM | POA: Diagnosis not present

## 2015-07-26 DIAGNOSIS — I639 Cerebral infarction, unspecified: Secondary | ICD-10-CM | POA: Diagnosis not present

## 2015-07-26 DIAGNOSIS — D649 Anemia, unspecified: Secondary | ICD-10-CM | POA: Diagnosis not present

## 2015-07-26 DIAGNOSIS — Z1389 Encounter for screening for other disorder: Secondary | ICD-10-CM | POA: Diagnosis not present

## 2015-07-26 DIAGNOSIS — M1990 Unspecified osteoarthritis, unspecified site: Secondary | ICD-10-CM | POA: Diagnosis not present

## 2015-07-26 DIAGNOSIS — Z7901 Long term (current) use of anticoagulants: Secondary | ICD-10-CM | POA: Diagnosis not present

## 2015-07-26 DIAGNOSIS — Z6827 Body mass index (BMI) 27.0-27.9, adult: Secondary | ICD-10-CM | POA: Diagnosis not present

## 2015-07-26 DIAGNOSIS — I480 Paroxysmal atrial fibrillation: Secondary | ICD-10-CM | POA: Diagnosis not present

## 2015-07-26 DIAGNOSIS — R627 Adult failure to thrive: Secondary | ICD-10-CM | POA: Diagnosis not present

## 2015-07-26 DIAGNOSIS — F0390 Unspecified dementia without behavioral disturbance: Secondary | ICD-10-CM | POA: Diagnosis not present

## 2015-07-26 DIAGNOSIS — R7301 Impaired fasting glucose: Secondary | ICD-10-CM | POA: Diagnosis not present

## 2015-08-10 DIAGNOSIS — I480 Paroxysmal atrial fibrillation: Secondary | ICD-10-CM | POA: Diagnosis not present

## 2015-08-10 DIAGNOSIS — Z7901 Long term (current) use of anticoagulants: Secondary | ICD-10-CM | POA: Diagnosis not present

## 2015-08-27 DIAGNOSIS — M545 Low back pain: Secondary | ICD-10-CM | POA: Diagnosis not present

## 2015-08-27 DIAGNOSIS — Z7901 Long term (current) use of anticoagulants: Secondary | ICD-10-CM | POA: Diagnosis not present

## 2015-08-27 DIAGNOSIS — I480 Paroxysmal atrial fibrillation: Secondary | ICD-10-CM | POA: Diagnosis not present

## 2015-09-24 DIAGNOSIS — I480 Paroxysmal atrial fibrillation: Secondary | ICD-10-CM | POA: Diagnosis not present

## 2015-09-24 DIAGNOSIS — Z7901 Long term (current) use of anticoagulants: Secondary | ICD-10-CM | POA: Diagnosis not present

## 2015-10-12 DIAGNOSIS — H401132 Primary open-angle glaucoma, bilateral, moderate stage: Secondary | ICD-10-CM | POA: Diagnosis not present

## 2015-10-22 DIAGNOSIS — I480 Paroxysmal atrial fibrillation: Secondary | ICD-10-CM | POA: Diagnosis not present

## 2015-10-22 DIAGNOSIS — Z7901 Long term (current) use of anticoagulants: Secondary | ICD-10-CM | POA: Diagnosis not present

## 2015-11-29 DIAGNOSIS — Z7901 Long term (current) use of anticoagulants: Secondary | ICD-10-CM | POA: Diagnosis not present

## 2015-11-29 DIAGNOSIS — I480 Paroxysmal atrial fibrillation: Secondary | ICD-10-CM | POA: Diagnosis not present

## 2015-12-07 DIAGNOSIS — I10 Essential (primary) hypertension: Secondary | ICD-10-CM | POA: Diagnosis not present

## 2015-12-07 DIAGNOSIS — D72829 Elevated white blood cell count, unspecified: Secondary | ICD-10-CM | POA: Diagnosis not present

## 2015-12-07 DIAGNOSIS — F0390 Unspecified dementia without behavioral disturbance: Secondary | ICD-10-CM | POA: Diagnosis not present

## 2015-12-07 DIAGNOSIS — F418 Other specified anxiety disorders: Secondary | ICD-10-CM | POA: Diagnosis not present

## 2015-12-07 DIAGNOSIS — Z6829 Body mass index (BMI) 29.0-29.9, adult: Secondary | ICD-10-CM | POA: Diagnosis not present

## 2015-12-07 DIAGNOSIS — I480 Paroxysmal atrial fibrillation: Secondary | ICD-10-CM | POA: Diagnosis not present

## 2015-12-07 DIAGNOSIS — Z7901 Long term (current) use of anticoagulants: Secondary | ICD-10-CM | POA: Diagnosis not present

## 2015-12-07 DIAGNOSIS — R2689 Other abnormalities of gait and mobility: Secondary | ICD-10-CM | POA: Diagnosis not present

## 2015-12-07 DIAGNOSIS — R7301 Impaired fasting glucose: Secondary | ICD-10-CM | POA: Diagnosis not present

## 2016-01-03 DIAGNOSIS — Z7901 Long term (current) use of anticoagulants: Secondary | ICD-10-CM | POA: Diagnosis not present

## 2016-01-03 DIAGNOSIS — N3090 Cystitis, unspecified without hematuria: Secondary | ICD-10-CM | POA: Diagnosis not present

## 2016-01-03 DIAGNOSIS — I480 Paroxysmal atrial fibrillation: Secondary | ICD-10-CM | POA: Diagnosis not present

## 2016-02-05 DIAGNOSIS — I480 Paroxysmal atrial fibrillation: Secondary | ICD-10-CM | POA: Diagnosis not present

## 2016-02-05 DIAGNOSIS — I639 Cerebral infarction, unspecified: Secondary | ICD-10-CM | POA: Diagnosis not present

## 2016-02-05 DIAGNOSIS — Z7901 Long term (current) use of anticoagulants: Secondary | ICD-10-CM | POA: Diagnosis not present

## 2016-03-06 DIAGNOSIS — I480 Paroxysmal atrial fibrillation: Secondary | ICD-10-CM | POA: Diagnosis not present

## 2016-03-06 DIAGNOSIS — Z7901 Long term (current) use of anticoagulants: Secondary | ICD-10-CM | POA: Diagnosis not present

## 2016-04-01 DIAGNOSIS — Z6830 Body mass index (BMI) 30.0-30.9, adult: Secondary | ICD-10-CM | POA: Diagnosis not present

## 2016-04-01 DIAGNOSIS — R2689 Other abnormalities of gait and mobility: Secondary | ICD-10-CM | POA: Diagnosis not present

## 2016-04-01 DIAGNOSIS — Z7901 Long term (current) use of anticoagulants: Secondary | ICD-10-CM | POA: Diagnosis not present

## 2016-04-01 DIAGNOSIS — F418 Other specified anxiety disorders: Secondary | ICD-10-CM | POA: Diagnosis not present

## 2016-04-01 DIAGNOSIS — F0390 Unspecified dementia without behavioral disturbance: Secondary | ICD-10-CM | POA: Diagnosis not present

## 2016-04-01 DIAGNOSIS — R7301 Impaired fasting glucose: Secondary | ICD-10-CM | POA: Diagnosis not present

## 2016-04-01 DIAGNOSIS — I10 Essential (primary) hypertension: Secondary | ICD-10-CM | POA: Diagnosis not present

## 2016-04-01 DIAGNOSIS — R0600 Dyspnea, unspecified: Secondary | ICD-10-CM | POA: Diagnosis not present

## 2016-04-01 DIAGNOSIS — I480 Paroxysmal atrial fibrillation: Secondary | ICD-10-CM | POA: Diagnosis not present

## 2016-04-01 DIAGNOSIS — M810 Age-related osteoporosis without current pathological fracture: Secondary | ICD-10-CM | POA: Diagnosis not present

## 2016-05-01 DIAGNOSIS — I639 Cerebral infarction, unspecified: Secondary | ICD-10-CM | POA: Diagnosis not present

## 2016-05-01 DIAGNOSIS — Z7901 Long term (current) use of anticoagulants: Secondary | ICD-10-CM | POA: Diagnosis not present

## 2016-05-01 DIAGNOSIS — I480 Paroxysmal atrial fibrillation: Secondary | ICD-10-CM | POA: Diagnosis not present

## 2016-06-04 DIAGNOSIS — Z7901 Long term (current) use of anticoagulants: Secondary | ICD-10-CM | POA: Diagnosis not present

## 2016-06-04 DIAGNOSIS — I480 Paroxysmal atrial fibrillation: Secondary | ICD-10-CM | POA: Diagnosis not present

## 2016-07-08 DIAGNOSIS — I480 Paroxysmal atrial fibrillation: Secondary | ICD-10-CM | POA: Diagnosis not present

## 2016-07-08 DIAGNOSIS — Z7901 Long term (current) use of anticoagulants: Secondary | ICD-10-CM | POA: Diagnosis not present

## 2016-07-08 DIAGNOSIS — I639 Cerebral infarction, unspecified: Secondary | ICD-10-CM | POA: Diagnosis not present

## 2016-08-01 DIAGNOSIS — H401113 Primary open-angle glaucoma, right eye, severe stage: Secondary | ICD-10-CM | POA: Diagnosis not present

## 2016-08-01 DIAGNOSIS — H5213 Myopia, bilateral: Secondary | ICD-10-CM | POA: Diagnosis not present

## 2016-08-01 DIAGNOSIS — H401123 Primary open-angle glaucoma, left eye, severe stage: Secondary | ICD-10-CM | POA: Diagnosis not present

## 2016-08-07 DIAGNOSIS — Z7901 Long term (current) use of anticoagulants: Secondary | ICD-10-CM | POA: Diagnosis not present

## 2016-08-07 DIAGNOSIS — Z23 Encounter for immunization: Secondary | ICD-10-CM | POA: Diagnosis not present

## 2016-08-07 DIAGNOSIS — I480 Paroxysmal atrial fibrillation: Secondary | ICD-10-CM | POA: Diagnosis not present

## 2016-08-26 DIAGNOSIS — E559 Vitamin D deficiency, unspecified: Secondary | ICD-10-CM | POA: Diagnosis not present

## 2016-08-26 DIAGNOSIS — F418 Other specified anxiety disorders: Secondary | ICD-10-CM | POA: Diagnosis not present

## 2016-08-26 DIAGNOSIS — M810 Age-related osteoporosis without current pathological fracture: Secondary | ICD-10-CM | POA: Diagnosis not present

## 2016-08-26 DIAGNOSIS — I10 Essential (primary) hypertension: Secondary | ICD-10-CM | POA: Diagnosis not present

## 2016-08-26 DIAGNOSIS — Z1389 Encounter for screening for other disorder: Secondary | ICD-10-CM | POA: Diagnosis not present

## 2016-08-26 DIAGNOSIS — Z6828 Body mass index (BMI) 28.0-28.9, adult: Secondary | ICD-10-CM | POA: Diagnosis not present

## 2016-08-26 DIAGNOSIS — D649 Anemia, unspecified: Secondary | ICD-10-CM | POA: Diagnosis not present

## 2016-08-26 DIAGNOSIS — I480 Paroxysmal atrial fibrillation: Secondary | ICD-10-CM | POA: Diagnosis not present

## 2016-08-26 DIAGNOSIS — R7301 Impaired fasting glucose: Secondary | ICD-10-CM | POA: Diagnosis not present

## 2016-08-26 DIAGNOSIS — F0390 Unspecified dementia without behavioral disturbance: Secondary | ICD-10-CM | POA: Diagnosis not present

## 2016-08-26 DIAGNOSIS — Z7901 Long term (current) use of anticoagulants: Secondary | ICD-10-CM | POA: Diagnosis not present

## 2016-08-26 DIAGNOSIS — R2689 Other abnormalities of gait and mobility: Secondary | ICD-10-CM | POA: Diagnosis not present

## 2016-09-25 DIAGNOSIS — Z7901 Long term (current) use of anticoagulants: Secondary | ICD-10-CM | POA: Diagnosis not present

## 2016-09-25 DIAGNOSIS — I480 Paroxysmal atrial fibrillation: Secondary | ICD-10-CM | POA: Diagnosis not present

## 2016-11-19 DIAGNOSIS — Z7901 Long term (current) use of anticoagulants: Secondary | ICD-10-CM | POA: Diagnosis not present

## 2016-11-19 DIAGNOSIS — I480 Paroxysmal atrial fibrillation: Secondary | ICD-10-CM | POA: Diagnosis not present

## 2016-12-25 DIAGNOSIS — Z6827 Body mass index (BMI) 27.0-27.9, adult: Secondary | ICD-10-CM | POA: Diagnosis not present

## 2016-12-25 DIAGNOSIS — Z1389 Encounter for screening for other disorder: Secondary | ICD-10-CM | POA: Diagnosis not present

## 2016-12-25 DIAGNOSIS — F0390 Unspecified dementia without behavioral disturbance: Secondary | ICD-10-CM | POA: Diagnosis not present

## 2016-12-25 DIAGNOSIS — I10 Essential (primary) hypertension: Secondary | ICD-10-CM | POA: Diagnosis not present

## 2016-12-25 DIAGNOSIS — R7301 Impaired fasting glucose: Secondary | ICD-10-CM | POA: Diagnosis not present

## 2016-12-25 DIAGNOSIS — R2689 Other abnormalities of gait and mobility: Secondary | ICD-10-CM | POA: Diagnosis not present

## 2016-12-25 DIAGNOSIS — I480 Paroxysmal atrial fibrillation: Secondary | ICD-10-CM | POA: Diagnosis not present

## 2016-12-25 DIAGNOSIS — Z7901 Long term (current) use of anticoagulants: Secondary | ICD-10-CM | POA: Diagnosis not present

## 2017-01-27 DIAGNOSIS — Z7901 Long term (current) use of anticoagulants: Secondary | ICD-10-CM | POA: Diagnosis not present

## 2017-01-27 DIAGNOSIS — I480 Paroxysmal atrial fibrillation: Secondary | ICD-10-CM | POA: Diagnosis not present

## 2017-02-04 DIAGNOSIS — H401133 Primary open-angle glaucoma, bilateral, severe stage: Secondary | ICD-10-CM | POA: Diagnosis not present

## 2017-03-04 DIAGNOSIS — I480 Paroxysmal atrial fibrillation: Secondary | ICD-10-CM | POA: Diagnosis not present

## 2017-03-04 DIAGNOSIS — Z7901 Long term (current) use of anticoagulants: Secondary | ICD-10-CM | POA: Diagnosis not present

## 2017-04-02 DIAGNOSIS — I480 Paroxysmal atrial fibrillation: Secondary | ICD-10-CM | POA: Diagnosis not present

## 2017-04-02 DIAGNOSIS — Z7901 Long term (current) use of anticoagulants: Secondary | ICD-10-CM | POA: Diagnosis not present

## 2017-04-23 DIAGNOSIS — I480 Paroxysmal atrial fibrillation: Secondary | ICD-10-CM | POA: Diagnosis not present

## 2017-04-23 DIAGNOSIS — Z7901 Long term (current) use of anticoagulants: Secondary | ICD-10-CM | POA: Diagnosis not present

## 2017-05-27 DIAGNOSIS — I480 Paroxysmal atrial fibrillation: Secondary | ICD-10-CM | POA: Diagnosis not present

## 2017-05-27 DIAGNOSIS — R2689 Other abnormalities of gait and mobility: Secondary | ICD-10-CM | POA: Diagnosis not present

## 2017-05-27 DIAGNOSIS — F0390 Unspecified dementia without behavioral disturbance: Secondary | ICD-10-CM | POA: Diagnosis not present

## 2017-05-27 DIAGNOSIS — R7301 Impaired fasting glucose: Secondary | ICD-10-CM | POA: Diagnosis not present

## 2017-05-27 DIAGNOSIS — R51 Headache: Secondary | ICD-10-CM | POA: Diagnosis not present

## 2017-05-27 DIAGNOSIS — Z6828 Body mass index (BMI) 28.0-28.9, adult: Secondary | ICD-10-CM | POA: Diagnosis not present

## 2017-05-27 DIAGNOSIS — I10 Essential (primary) hypertension: Secondary | ICD-10-CM | POA: Diagnosis not present

## 2017-05-27 DIAGNOSIS — Z7901 Long term (current) use of anticoagulants: Secondary | ICD-10-CM | POA: Diagnosis not present

## 2017-05-27 DIAGNOSIS — F418 Other specified anxiety disorders: Secondary | ICD-10-CM | POA: Diagnosis not present

## 2017-06-10 DIAGNOSIS — R2689 Other abnormalities of gait and mobility: Secondary | ICD-10-CM | POA: Diagnosis not present

## 2017-06-10 DIAGNOSIS — R51 Headache: Secondary | ICD-10-CM | POA: Diagnosis not present

## 2017-06-10 DIAGNOSIS — I480 Paroxysmal atrial fibrillation: Secondary | ICD-10-CM | POA: Diagnosis not present

## 2017-06-10 DIAGNOSIS — Z7901 Long term (current) use of anticoagulants: Secondary | ICD-10-CM | POA: Diagnosis not present

## 2017-07-09 DIAGNOSIS — I480 Paroxysmal atrial fibrillation: Secondary | ICD-10-CM | POA: Diagnosis not present

## 2017-07-09 DIAGNOSIS — Z7901 Long term (current) use of anticoagulants: Secondary | ICD-10-CM | POA: Diagnosis not present

## 2017-08-06 DIAGNOSIS — I480 Paroxysmal atrial fibrillation: Secondary | ICD-10-CM | POA: Diagnosis not present

## 2017-08-06 DIAGNOSIS — Z7901 Long term (current) use of anticoagulants: Secondary | ICD-10-CM | POA: Diagnosis not present

## 2017-08-10 DIAGNOSIS — Z23 Encounter for immunization: Secondary | ICD-10-CM | POA: Diagnosis not present

## 2017-08-10 DIAGNOSIS — H401123 Primary open-angle glaucoma, left eye, severe stage: Secondary | ICD-10-CM | POA: Diagnosis not present

## 2017-08-10 DIAGNOSIS — H401112 Primary open-angle glaucoma, right eye, moderate stage: Secondary | ICD-10-CM | POA: Diagnosis not present

## 2017-09-22 DIAGNOSIS — E559 Vitamin D deficiency, unspecified: Secondary | ICD-10-CM | POA: Diagnosis not present

## 2017-09-22 DIAGNOSIS — F418 Other specified anxiety disorders: Secondary | ICD-10-CM | POA: Diagnosis not present

## 2017-09-22 DIAGNOSIS — R2689 Other abnormalities of gait and mobility: Secondary | ICD-10-CM | POA: Diagnosis not present

## 2017-09-22 DIAGNOSIS — I10 Essential (primary) hypertension: Secondary | ICD-10-CM | POA: Diagnosis not present

## 2017-09-22 DIAGNOSIS — F0390 Unspecified dementia without behavioral disturbance: Secondary | ICD-10-CM | POA: Diagnosis not present

## 2017-09-22 DIAGNOSIS — I480 Paroxysmal atrial fibrillation: Secondary | ICD-10-CM | POA: Diagnosis not present

## 2017-09-22 DIAGNOSIS — Z7901 Long term (current) use of anticoagulants: Secondary | ICD-10-CM | POA: Diagnosis not present

## 2017-09-22 DIAGNOSIS — R21 Rash and other nonspecific skin eruption: Secondary | ICD-10-CM | POA: Diagnosis not present

## 2017-09-22 DIAGNOSIS — Z6829 Body mass index (BMI) 29.0-29.9, adult: Secondary | ICD-10-CM | POA: Diagnosis not present

## 2017-09-22 DIAGNOSIS — R7301 Impaired fasting glucose: Secondary | ICD-10-CM | POA: Diagnosis not present

## 2017-09-24 DIAGNOSIS — D649 Anemia, unspecified: Secondary | ICD-10-CM | POA: Diagnosis not present

## 2017-10-28 DIAGNOSIS — I480 Paroxysmal atrial fibrillation: Secondary | ICD-10-CM | POA: Diagnosis not present

## 2017-10-28 DIAGNOSIS — Z7901 Long term (current) use of anticoagulants: Secondary | ICD-10-CM | POA: Diagnosis not present

## 2017-10-28 DIAGNOSIS — D6489 Other specified anemias: Secondary | ICD-10-CM | POA: Diagnosis not present

## 2017-11-24 DIAGNOSIS — I480 Paroxysmal atrial fibrillation: Secondary | ICD-10-CM | POA: Diagnosis not present

## 2017-11-24 DIAGNOSIS — Z7901 Long term (current) use of anticoagulants: Secondary | ICD-10-CM | POA: Diagnosis not present

## 2017-12-24 DIAGNOSIS — Z7901 Long term (current) use of anticoagulants: Secondary | ICD-10-CM | POA: Diagnosis not present

## 2017-12-24 DIAGNOSIS — I480 Paroxysmal atrial fibrillation: Secondary | ICD-10-CM | POA: Diagnosis not present

## 2018-01-21 DIAGNOSIS — Z7901 Long term (current) use of anticoagulants: Secondary | ICD-10-CM | POA: Diagnosis not present

## 2018-02-24 DIAGNOSIS — Z7901 Long term (current) use of anticoagulants: Secondary | ICD-10-CM | POA: Diagnosis not present

## 2018-02-24 DIAGNOSIS — R0989 Other specified symptoms and signs involving the circulatory and respiratory systems: Secondary | ICD-10-CM | POA: Diagnosis not present

## 2018-02-24 DIAGNOSIS — I480 Paroxysmal atrial fibrillation: Secondary | ICD-10-CM | POA: Diagnosis not present

## 2018-02-24 DIAGNOSIS — F0390 Unspecified dementia without behavioral disturbance: Secondary | ICD-10-CM | POA: Diagnosis not present

## 2018-02-24 DIAGNOSIS — F418 Other specified anxiety disorders: Secondary | ICD-10-CM | POA: Diagnosis not present

## 2018-02-24 DIAGNOSIS — I10 Essential (primary) hypertension: Secondary | ICD-10-CM | POA: Diagnosis not present

## 2018-02-24 DIAGNOSIS — R7301 Impaired fasting glucose: Secondary | ICD-10-CM | POA: Diagnosis not present

## 2018-02-24 DIAGNOSIS — Z6829 Body mass index (BMI) 29.0-29.9, adult: Secondary | ICD-10-CM | POA: Diagnosis not present

## 2018-02-24 DIAGNOSIS — Z1389 Encounter for screening for other disorder: Secondary | ICD-10-CM | POA: Diagnosis not present

## 2018-03-25 DIAGNOSIS — I480 Paroxysmal atrial fibrillation: Secondary | ICD-10-CM | POA: Diagnosis not present

## 2018-03-25 DIAGNOSIS — Z7901 Long term (current) use of anticoagulants: Secondary | ICD-10-CM | POA: Diagnosis not present

## 2018-03-31 DIAGNOSIS — H401112 Primary open-angle glaucoma, right eye, moderate stage: Secondary | ICD-10-CM | POA: Diagnosis not present

## 2018-03-31 DIAGNOSIS — H401123 Primary open-angle glaucoma, left eye, severe stage: Secondary | ICD-10-CM | POA: Diagnosis not present

## 2018-04-27 DIAGNOSIS — Z7901 Long term (current) use of anticoagulants: Secondary | ICD-10-CM | POA: Diagnosis not present

## 2018-04-27 DIAGNOSIS — Z6829 Body mass index (BMI) 29.0-29.9, adult: Secondary | ICD-10-CM | POA: Diagnosis not present

## 2018-04-27 DIAGNOSIS — I480 Paroxysmal atrial fibrillation: Secondary | ICD-10-CM | POA: Diagnosis not present

## 2018-06-08 DIAGNOSIS — Z7901 Long term (current) use of anticoagulants: Secondary | ICD-10-CM | POA: Diagnosis not present

## 2018-06-08 DIAGNOSIS — I480 Paroxysmal atrial fibrillation: Secondary | ICD-10-CM | POA: Diagnosis not present

## 2018-07-07 DIAGNOSIS — I480 Paroxysmal atrial fibrillation: Secondary | ICD-10-CM | POA: Diagnosis not present

## 2018-07-07 DIAGNOSIS — Z7901 Long term (current) use of anticoagulants: Secondary | ICD-10-CM | POA: Diagnosis not present

## 2018-08-31 DIAGNOSIS — Z1389 Encounter for screening for other disorder: Secondary | ICD-10-CM | POA: Diagnosis not present

## 2018-08-31 DIAGNOSIS — I480 Paroxysmal atrial fibrillation: Secondary | ICD-10-CM | POA: Diagnosis not present

## 2018-08-31 DIAGNOSIS — M810 Age-related osteoporosis without current pathological fracture: Secondary | ICD-10-CM | POA: Diagnosis not present

## 2018-08-31 DIAGNOSIS — R2689 Other abnormalities of gait and mobility: Secondary | ICD-10-CM | POA: Diagnosis not present

## 2018-08-31 DIAGNOSIS — F418 Other specified anxiety disorders: Secondary | ICD-10-CM | POA: Diagnosis not present

## 2018-08-31 DIAGNOSIS — F0390 Unspecified dementia without behavioral disturbance: Secondary | ICD-10-CM | POA: Diagnosis not present

## 2018-08-31 DIAGNOSIS — Z23 Encounter for immunization: Secondary | ICD-10-CM | POA: Diagnosis not present

## 2018-08-31 DIAGNOSIS — L509 Urticaria, unspecified: Secondary | ICD-10-CM | POA: Diagnosis not present

## 2018-08-31 DIAGNOSIS — Z7901 Long term (current) use of anticoagulants: Secondary | ICD-10-CM | POA: Diagnosis not present

## 2018-08-31 DIAGNOSIS — R7301 Impaired fasting glucose: Secondary | ICD-10-CM | POA: Diagnosis not present

## 2018-08-31 DIAGNOSIS — I10 Essential (primary) hypertension: Secondary | ICD-10-CM | POA: Diagnosis not present

## 2018-08-31 DIAGNOSIS — Z6830 Body mass index (BMI) 30.0-30.9, adult: Secondary | ICD-10-CM | POA: Diagnosis not present

## 2018-08-31 DIAGNOSIS — R0901 Asphyxia: Secondary | ICD-10-CM | POA: Diagnosis not present

## 2018-10-05 DIAGNOSIS — H401133 Primary open-angle glaucoma, bilateral, severe stage: Secondary | ICD-10-CM | POA: Diagnosis not present

## 2018-10-05 DIAGNOSIS — Z961 Presence of intraocular lens: Secondary | ICD-10-CM | POA: Diagnosis not present

## 2018-10-06 DIAGNOSIS — Z7901 Long term (current) use of anticoagulants: Secondary | ICD-10-CM | POA: Diagnosis not present

## 2018-10-06 DIAGNOSIS — I480 Paroxysmal atrial fibrillation: Secondary | ICD-10-CM | POA: Diagnosis not present

## 2018-11-17 DIAGNOSIS — Z7901 Long term (current) use of anticoagulants: Secondary | ICD-10-CM | POA: Diagnosis not present

## 2018-11-17 DIAGNOSIS — F0390 Unspecified dementia without behavioral disturbance: Secondary | ICD-10-CM | POA: Diagnosis not present

## 2018-11-17 DIAGNOSIS — I480 Paroxysmal atrial fibrillation: Secondary | ICD-10-CM | POA: Diagnosis not present

## 2018-11-24 DIAGNOSIS — M810 Age-related osteoporosis without current pathological fracture: Secondary | ICD-10-CM | POA: Diagnosis not present

## 2018-11-24 DIAGNOSIS — Z6830 Body mass index (BMI) 30.0-30.9, adult: Secondary | ICD-10-CM | POA: Diagnosis not present

## 2018-11-24 DIAGNOSIS — M25532 Pain in left wrist: Secondary | ICD-10-CM | POA: Diagnosis not present

## 2018-11-24 DIAGNOSIS — I10 Essential (primary) hypertension: Secondary | ICD-10-CM | POA: Diagnosis not present

## 2018-11-24 DIAGNOSIS — I480 Paroxysmal atrial fibrillation: Secondary | ICD-10-CM | POA: Diagnosis not present

## 2018-11-30 ENCOUNTER — Ambulatory Visit (INDEPENDENT_AMBULATORY_CARE_PROVIDER_SITE_OTHER): Payer: Self-pay | Admitting: Orthopaedic Surgery

## 2018-12-29 DIAGNOSIS — I480 Paroxysmal atrial fibrillation: Secondary | ICD-10-CM | POA: Diagnosis not present

## 2018-12-29 DIAGNOSIS — Z7901 Long term (current) use of anticoagulants: Secondary | ICD-10-CM | POA: Diagnosis not present

## 2019-01-12 ENCOUNTER — Emergency Department (HOSPITAL_COMMUNITY): Payer: Medicare Other

## 2019-01-12 ENCOUNTER — Other Ambulatory Visit: Payer: Self-pay

## 2019-01-12 ENCOUNTER — Observation Stay (HOSPITAL_COMMUNITY): Payer: Medicare Other

## 2019-01-12 ENCOUNTER — Inpatient Hospital Stay (HOSPITAL_COMMUNITY)
Admission: EM | Admit: 2019-01-12 | Discharge: 2019-01-16 | DRG: 206 | Disposition: A | Discharge status: Skilled Nursing Facility | Payer: Medicare Other | Attending: Internal Medicine | Admitting: Internal Medicine

## 2019-01-12 ENCOUNTER — Encounter (HOSPITAL_COMMUNITY): Payer: Self-pay

## 2019-01-12 DIAGNOSIS — M549 Dorsalgia, unspecified: Secondary | ICD-10-CM

## 2019-01-12 DIAGNOSIS — R000 Tachycardia, unspecified: Secondary | ICD-10-CM | POA: Diagnosis not present

## 2019-01-12 DIAGNOSIS — W1830XA Fall on same level, unspecified, initial encounter: Secondary | ICD-10-CM | POA: Diagnosis present

## 2019-01-12 DIAGNOSIS — S2231XA Fracture of one rib, right side, initial encounter for closed fracture: Secondary | ICD-10-CM | POA: Diagnosis not present

## 2019-01-12 DIAGNOSIS — S32019A Unspecified fracture of first lumbar vertebra, initial encounter for closed fracture: Secondary | ICD-10-CM | POA: Diagnosis not present

## 2019-01-12 DIAGNOSIS — S32009A Unspecified fracture of unspecified lumbar vertebra, initial encounter for closed fracture: Secondary | ICD-10-CM | POA: Diagnosis not present

## 2019-01-12 DIAGNOSIS — Z9049 Acquired absence of other specified parts of digestive tract: Secondary | ICD-10-CM

## 2019-01-12 DIAGNOSIS — Z90710 Acquired absence of both cervix and uterus: Secondary | ICD-10-CM

## 2019-01-12 DIAGNOSIS — S32018A Other fracture of first lumbar vertebra, initial encounter for closed fracture: Secondary | ICD-10-CM | POA: Diagnosis not present

## 2019-01-12 DIAGNOSIS — I480 Paroxysmal atrial fibrillation: Secondary | ICD-10-CM | POA: Diagnosis present

## 2019-01-12 DIAGNOSIS — F329 Major depressive disorder, single episode, unspecified: Secondary | ICD-10-CM | POA: Diagnosis present

## 2019-01-12 DIAGNOSIS — S79921A Unspecified injury of right thigh, initial encounter: Secondary | ICD-10-CM | POA: Diagnosis not present

## 2019-01-12 DIAGNOSIS — Z79891 Long term (current) use of opiate analgesic: Secondary | ICD-10-CM

## 2019-01-12 DIAGNOSIS — S3992XA Unspecified injury of lower back, initial encounter: Secondary | ICD-10-CM | POA: Diagnosis not present

## 2019-01-12 DIAGNOSIS — N179 Acute kidney failure, unspecified: Secondary | ICD-10-CM | POA: Diagnosis not present

## 2019-01-12 DIAGNOSIS — S79911A Unspecified injury of right hip, initial encounter: Secondary | ICD-10-CM | POA: Diagnosis not present

## 2019-01-12 DIAGNOSIS — M810 Age-related osteoporosis without current pathological fracture: Secondary | ICD-10-CM | POA: Diagnosis present

## 2019-01-12 DIAGNOSIS — S3991XA Unspecified injury of abdomen, initial encounter: Secondary | ICD-10-CM | POA: Diagnosis not present

## 2019-01-12 DIAGNOSIS — R0781 Pleurodynia: Secondary | ICD-10-CM | POA: Diagnosis not present

## 2019-01-12 DIAGNOSIS — R109 Unspecified abdominal pain: Secondary | ICD-10-CM | POA: Diagnosis not present

## 2019-01-12 DIAGNOSIS — D72829 Elevated white blood cell count, unspecified: Secondary | ICD-10-CM | POA: Diagnosis present

## 2019-01-12 DIAGNOSIS — E785 Hyperlipidemia, unspecified: Secondary | ICD-10-CM | POA: Diagnosis present

## 2019-01-12 DIAGNOSIS — Y92009 Unspecified place in unspecified non-institutional (private) residence as the place of occurrence of the external cause: Secondary | ICD-10-CM

## 2019-01-12 DIAGNOSIS — F0390 Unspecified dementia without behavioral disturbance: Secondary | ICD-10-CM | POA: Diagnosis not present

## 2019-01-12 DIAGNOSIS — H409 Unspecified glaucoma: Secondary | ICD-10-CM | POA: Diagnosis present

## 2019-01-12 DIAGNOSIS — Z66 Do not resuscitate: Secondary | ICD-10-CM | POA: Diagnosis present

## 2019-01-12 DIAGNOSIS — S2239XA Fracture of one rib, unspecified side, initial encounter for closed fracture: Secondary | ICD-10-CM | POA: Diagnosis present

## 2019-01-12 DIAGNOSIS — S32029A Unspecified fracture of second lumbar vertebra, initial encounter for closed fracture: Secondary | ICD-10-CM | POA: Diagnosis not present

## 2019-01-12 DIAGNOSIS — Z79899 Other long term (current) drug therapy: Secondary | ICD-10-CM

## 2019-01-12 DIAGNOSIS — S199XXA Unspecified injury of neck, initial encounter: Secondary | ICD-10-CM | POA: Diagnosis not present

## 2019-01-12 DIAGNOSIS — I10 Essential (primary) hypertension: Secondary | ICD-10-CM | POA: Diagnosis not present

## 2019-01-12 DIAGNOSIS — Z882 Allergy status to sulfonamides status: Secondary | ICD-10-CM

## 2019-01-12 DIAGNOSIS — M546 Pain in thoracic spine: Secondary | ICD-10-CM | POA: Diagnosis not present

## 2019-01-12 DIAGNOSIS — R102 Pelvic and perineal pain: Secondary | ICD-10-CM | POA: Diagnosis not present

## 2019-01-12 DIAGNOSIS — F32A Depression, unspecified: Secondary | ICD-10-CM | POA: Diagnosis present

## 2019-01-12 DIAGNOSIS — W19XXXA Unspecified fall, initial encounter: Secondary | ICD-10-CM | POA: Diagnosis not present

## 2019-01-12 DIAGNOSIS — M25551 Pain in right hip: Secondary | ICD-10-CM | POA: Diagnosis not present

## 2019-01-12 DIAGNOSIS — Z7901 Long term (current) use of anticoagulants: Secondary | ICD-10-CM

## 2019-01-12 DIAGNOSIS — E860 Dehydration: Secondary | ICD-10-CM | POA: Diagnosis present

## 2019-01-12 DIAGNOSIS — I959 Hypotension, unspecified: Secondary | ICD-10-CM | POA: Diagnosis not present

## 2019-01-12 DIAGNOSIS — S32028A Other fracture of second lumbar vertebra, initial encounter for closed fracture: Secondary | ICD-10-CM | POA: Diagnosis not present

## 2019-01-12 DIAGNOSIS — S299XXA Unspecified injury of thorax, initial encounter: Secondary | ICD-10-CM | POA: Diagnosis not present

## 2019-01-12 DIAGNOSIS — R55 Syncope and collapse: Secondary | ICD-10-CM | POA: Diagnosis not present

## 2019-01-12 DIAGNOSIS — M5489 Other dorsalgia: Secondary | ICD-10-CM | POA: Diagnosis not present

## 2019-01-12 DIAGNOSIS — R52 Pain, unspecified: Secondary | ICD-10-CM | POA: Diagnosis not present

## 2019-01-12 LAB — CBC WITH DIFFERENTIAL/PLATELET
Abs Immature Granulocytes: 0.17 10*3/uL — ABNORMAL HIGH (ref 0.00–0.07)
Basophils Absolute: 0 10*3/uL (ref 0.0–0.1)
Basophils Relative: 0 %
Eosinophils Absolute: 0 10*3/uL (ref 0.0–0.5)
Eosinophils Relative: 0 %
HCT: 38.4 % (ref 36.0–46.0)
Hemoglobin: 11.7 g/dL — ABNORMAL LOW (ref 12.0–15.0)
Immature Granulocytes: 1 %
Lymphocytes Relative: 3 %
Lymphs Abs: 0.6 10*3/uL — ABNORMAL LOW (ref 0.7–4.0)
MCH: 29.8 pg (ref 26.0–34.0)
MCHC: 30.5 g/dL (ref 30.0–36.0)
MCV: 97.7 fL (ref 80.0–100.0)
Monocytes Absolute: 0.3 10*3/uL (ref 0.1–1.0)
Monocytes Relative: 2 %
Neutro Abs: 18.2 10*3/uL — ABNORMAL HIGH (ref 1.7–7.7)
Neutrophils Relative %: 94 %
Platelets: 257 10*3/uL (ref 150–400)
RBC: 3.93 MIL/uL (ref 3.87–5.11)
RDW: 14.1 % (ref 11.5–15.5)
WBC: 19.3 10*3/uL — ABNORMAL HIGH (ref 4.0–10.5)
nRBC: 0 % (ref 0.0–0.2)

## 2019-01-12 LAB — PROTIME-INR
INR: 1.9 — ABNORMAL HIGH (ref 0.8–1.2)
Prothrombin Time: 21.2 seconds — ABNORMAL HIGH (ref 11.4–15.2)

## 2019-01-12 LAB — BASIC METABOLIC PANEL
Anion gap: 10 (ref 5–15)
BUN: 29 mg/dL — ABNORMAL HIGH (ref 8–23)
CO2: 25 mmol/L (ref 22–32)
Calcium: 8.9 mg/dL (ref 8.9–10.3)
Chloride: 104 mmol/L (ref 98–111)
Creatinine, Ser: 1.5 mg/dL — ABNORMAL HIGH (ref 0.44–1.00)
GFR calc Af Amer: 35 mL/min — ABNORMAL LOW (ref >60)
GFR calc non Af Amer: 30 mL/min — ABNORMAL LOW (ref >60)
Glucose, Bld: 158 mg/dL — ABNORMAL HIGH (ref 70–99)
Potassium: 3.9 mmol/L (ref 3.5–5.1)
Sodium: 139 mmol/L (ref 135–145)

## 2019-01-12 LAB — CK: Total CK: 37 U/L — ABNORMAL LOW (ref 38–234)

## 2019-01-12 LAB — DIGOXIN LEVEL: Digoxin Level: 0.4 ng/mL — ABNORMAL LOW (ref 0.8–2.0)

## 2019-01-12 MED ORDER — DIGOXIN 125 MCG PO TABS
0.1250 mg | ORAL_TABLET | ORAL | Status: DC
  Administered 2019-01-14: 0.125 mg via ORAL
  Filled 2019-01-12: qty 1

## 2019-01-12 MED ORDER — CALCIUM CARBONATE 1250 (500 CA) MG PO TABS
1250.0000 mg | ORAL_TABLET | Freq: Every day | ORAL | Status: DC
  Administered 2019-01-13 – 2019-01-16 (×4): 1250 mg via ORAL
  Filled 2019-01-12 (×4): qty 1

## 2019-01-12 MED ORDER — HYDRALAZINE HCL 20 MG/ML IJ SOLN: 5.0000 mg | INTRAMUSCULAR | Status: DC | PRN

## 2019-01-12 MED ORDER — DONEPEZIL HCL 5 MG PO TABS
5.0000 mg | ORAL_TABLET | Freq: Every day | ORAL | Status: DC
  Administered 2019-01-12 – 2019-01-15 (×4): 5 mg via ORAL
  Filled 2019-01-12 (×4): qty 1

## 2019-01-12 MED ORDER — FENTANYL CITRATE (PF) 100 MCG/2ML IJ SOLN
100.0000 ug | Freq: Once | INTRAMUSCULAR | Status: AC
  Administered 2019-01-12: 100 ug via INTRAVENOUS
  Filled 2019-01-12: qty 2

## 2019-01-12 MED ORDER — VITAMIN D 25 MCG (1000 UNIT) PO TABS
1000.0000 [IU] | ORAL_TABLET | Freq: Every day | ORAL | Status: DC
  Administered 2019-01-13 – 2019-01-16 (×4): 1000 [IU] via ORAL
  Filled 2019-01-12 (×4): qty 1

## 2019-01-12 MED ORDER — DULOXETINE HCL 30 MG PO CPEP
60.0000 mg | ORAL_CAPSULE | Freq: Every day | ORAL | Status: DC
  Administered 2019-01-12 – 2019-01-15 (×4): 60 mg via ORAL
  Filled 2019-01-12 (×5): qty 2

## 2019-01-12 MED ORDER — METHOCARBAMOL 500 MG PO TABS
500.0000 mg | ORAL_TABLET | Freq: Three times a day (TID) | ORAL | Status: DC | PRN
  Administered 2019-01-12 – 2019-01-16 (×7): 500 mg via ORAL
  Filled 2019-01-12 (×7): qty 1

## 2019-01-12 MED ORDER — ONDANSETRON HCL 4 MG PO TABS: 4.0000 mg | ORAL_TABLET | Freq: Four times a day (QID) | ORAL | Status: DC | PRN

## 2019-01-12 MED ORDER — TIMOLOL MALEATE 0.5 % OP SOLN
1.0000 [drp] | Freq: Every day | OPHTHALMIC | Status: DC
  Administered 2019-01-13 – 2019-01-16 (×4): 1 [drp] via OPHTHALMIC
  Filled 2019-01-12: qty 5

## 2019-01-12 MED ORDER — SENNOSIDES-DOCUSATE SODIUM 8.6-50 MG PO TABS: 1.0000 | ORAL_TABLET | Freq: Every evening | ORAL | Status: DC | PRN

## 2019-01-12 MED ORDER — ONDANSETRON HCL 4 MG/2ML IJ SOLN: 4.0000 mg | Freq: Four times a day (QID) | INTRAMUSCULAR | Status: DC | PRN

## 2019-01-12 MED ORDER — LATANOPROST 0.005 % OP SOLN
1.0000 [drp] | Freq: Every day | OPHTHALMIC | Status: DC
  Administered 2019-01-12 – 2019-01-15 (×4): 1 [drp] via OPHTHALMIC
  Filled 2019-01-12: qty 2.5

## 2019-01-12 MED ORDER — ACETAMINOPHEN 325 MG PO TABS
650.0000 mg | ORAL_TABLET | Freq: Four times a day (QID) | ORAL | Status: DC | PRN
  Administered 2019-01-14: 650 mg via ORAL
  Filled 2019-01-12: qty 2

## 2019-01-12 MED ORDER — OXYCODONE-ACETAMINOPHEN 5-325 MG PO TABS
1.0000 | ORAL_TABLET | ORAL | Status: DC | PRN
  Administered 2019-01-12 – 2019-01-16 (×7): 1 via ORAL
  Filled 2019-01-12 (×8): qty 1

## 2019-01-12 MED ORDER — TRAZODONE HCL 50 MG PO TABS
50.0000 mg | ORAL_TABLET | Freq: Every day | ORAL | Status: DC
  Administered 2019-01-12 – 2019-01-15 (×4): 50 mg via ORAL
  Filled 2019-01-12 (×4): qty 1

## 2019-01-12 MED ORDER — ENSURE ENLIVE PO LIQD
237.0000 mL | Freq: Two times a day (BID) | ORAL | Status: DC
  Administered 2019-01-13 – 2019-01-14 (×3): 237 mL via ORAL

## 2019-01-12 MED ORDER — ONDANSETRON HCL 4 MG/2ML IJ SOLN
4.0000 mg | Freq: Once | INTRAMUSCULAR | Status: AC
  Administered 2019-01-12: 4 mg via INTRAVENOUS

## 2019-01-12 MED ORDER — ACETAMINOPHEN 650 MG RE SUPP: 650.0000 mg | Freq: Four times a day (QID) | RECTAL | Status: DC | PRN

## 2019-01-12 NOTE — ED Triage Notes (Signed)
Pt arrives via EMS from home. Pt fell backward while getting in the car. Pt arrives in C-collar. Pt has baseline dementia. No LOC. Pt is on Warfarin. Per family pt is at baseline

## 2019-01-12 NOTE — ED Notes (Signed)
Pt was able to ambulate with assistance. Pt was unsteady, and needed guidance but able to bear weight.

## 2019-01-12 NOTE — ED Provider Notes (Signed)
[facility] Provider Note   CSN: [id number] Arrival date & time: 01/12/19  1457    History   Chief Complaint Chief Complaint  Patient presents with  . Fall    HPI Kendra Smith is a 85 y.o. female past 1 history of depression, glaucoma, hyperlipidemia, hypertension brought in by EMS for fall.  Per EMS, patient was being assisted to a vehicle with her family members.  She was had been complaining of some right back pain and was having difficulty getting in the car secondary to pain.  They went to go take her back into the house when she slowly fell to the ground.  They state that she did not hit her head or have any LOC.  She is on warfarin.  Patient has been complaining of right lower back pain since then.  She not receive any medication prior to ED arrival.  ED LEVEL 5 CAVEAT DUE TO DEMENTIA     The history is provided by the patient.    Past Medical History:  Diagnosis Date  . Depression   . Glaucoma   . Hyperlipidemia   . Hypertension   . Osteoporosis     Patient Active Problem List   Diagnosis Date Noted  . Rib fracture-12 01/12/2019  . Fracture of transverse process of lumbar vertebra L1-L2 01/12/2019  . Dementia (HCC) 01/12/2019  . Fall 01/12/2019  . Leukocytosis 01/12/2019  . Depression   . Hypertension     Past Surgical History:  Procedure Laterality Date  . ABDOMINAL HYSTERECTOMY    . ANKLE ARTHROSCOPY    . CHOLECYSTECTOMY    . EYE SURGERY    . RECTOCELE REPAIR       OB History   No obstetric history on file.      Home Medications    Prior to Admission medications   Medication Sig Start Date End Date Taking? Authorizing Provider  CALCIUM PO Take 1 tablet by mouth daily.   Yes [provider]  digoxin (LANOXIN) 0.125 MG tablet Take 0.125 mg by mouth See admin instructions. Mon, Wed, Fri only   Yes [provider]  donepezil (ARICEPT) 5 MG tablet Take 5 mg by mouth at bedtime. 10/15/18  Yes  [provider]  DULoxetine (CYMBALTA) 60 MG capsule Take 60 mg by mouth daily. 05/22/14  Yes [provider]  furosemide (LASIX) 40 MG tablet Take 20 mg by mouth See admin instructions. Tues, Thurs and Sat only 11/06/18  Yes [provider]  ibuprofen (ADVIL,MOTRIN) 200 MG tablet Take 400 mg by mouth daily as needed for moderate pain.   Yes [provider]  LUMIGAN 0.01 % SOLN Place 1 drop into both eyes daily. 06/12/14  Yes [provider]  timolol (TIMOPTIC) 0.5 % ophthalmic solution Place 1 drop into both eyes daily. 06/15/14  Yes [provider]  traZODone (DESYREL) 50 MG tablet Take 50 mg by mouth at bedtime. 10/15/18  Yes [provider]  VITAMIN D PO Take 1 tablet by mouth daily.   Yes [provider]  warfarin (COUMADIN) 2 MG tablet Take 2-3 mg by mouth See admin instructions. 2 mg on Tues, Thurs, Sat 3 mg on Mon, Wed, Fri and Sun 04/13/14  Yes [provider]  levofloxacin (LEVAQUIN) 500 MG tablet Take 1 tablet (500 mg total) by mouth daily. Patient not taking: Reported on 01/12/2019 06/29/14   Loren Racer, MD    Family History History reviewed. No pertinent family history.  Social History Social History   Tobacco Use  . Smoking status: Never Smoker  Substance Use Topics  . Alcohol use: No  . Drug use: No     Allergies   Sulfa antibiotics   Review of Systems Review of Systems  Unable to perform ROS: Dementia     Physical Exam Updated Vital Signs BP (!) 143/100   Pulse 98   Temp 98.1 F (36.7 C) (Oral)   Resp 18   Ht 5' (1.524 m) Comment: as per pt.  Wt 70.7 kg   SpO2 99%   BMI 30.43 kg/m   Physical Exam Vitals signs and nursing note reviewed.  Constitutional:      Appearance: Normal appearance. She is well-developed.  HENT:     Head: Normocephalic and atraumatic.     Comments: No tenderness to palpation of skull. No deformities or crepitus noted. No open wounds, abrasions or  lacerations.  Eyes:     General: Lids are normal.     Conjunctiva/sclera: Conjunctivae normal.     Pupils: Pupils are equal, round, and reactive to light.  Neck:     Musculoskeletal: Full passive range of motion without pain.  Cardiovascular:     Rate and Rhythm: Regular rhythm. Tachycardia present.     Pulses: Normal pulses.          Radial pulses are 2+ on the right side and 2+ on the left side.       Dorsalis pedis pulses are 2+ on the right side and 2+ on the left side.     Heart sounds: Normal heart sounds. No murmur. No friction rub. No gallop.   Pulmonary:     Effort: Pulmonary effort is normal.     Breath sounds: Normal breath sounds.     Comments: Lungs clear to auscultation bilaterally.  Symmetric chest rise.  No wheezing, rales, rhonchi. Abdominal:     Palpations: Abdomen is soft. Abdomen is not rigid.     Tenderness: There is no abdominal tenderness. There is no guarding.  Musculoskeletal: Normal range of motion.     Thoracic back: She exhibits tenderness.     Lumbar back: She exhibits tenderness.       Back:     Comments: Midline tenderness noted to thoracic and lumbar spine.  No deformity or crepitus noted.  Muscular tenderness noted to the right paraspinal muscles of the lower lumbar region.  Tenderness extends over the right hip.  Range of motion of right lower extremity intact.  She does have tenderness palpation in the right hip, right femur.  No tenderness palpation noted to right knee, right ankle.  No deformity or crepitus noted.  Skin:    General: Skin is warm and dry.     Capillary Refill: Capillary refill takes less than 2 seconds.  Neurological:     Mental Status: She is alert.     Comments: Follows commands Alert and oriented x1.  She can tell me her name but cannot tell me what year it is or where we are at. Moves all extremities spontaneously.  Psychiatric:        Speech: Speech normal.      ED Treatments / Results  Labs (all labs ordered are  listed, but only abnormal results are displayed) Labs Reviewed  BASIC METABOLIC PANEL - Abnormal; Notable for the following components:      Result Value   Glucose, Bld 158 (*)    BUN 29 (*)    Creatinine, Ser 1.50 (*)  GFR calc non Af Amer 30 (*)    GFR calc Af Amer 35 (*)    All other components within normal limits  CBC WITH DIFFERENTIAL/PLATELET - Abnormal; Notable for the following components:   WBC 19.3 (*)    Hemoglobin 11.7 (*)    Neutro Abs 18.2 (*)    Lymphs Abs 0.6 (*)    Abs Immature Granulocytes 0.17 (*)    All other components within normal limits  CK - Abnormal; Notable for the following components:   Total CK 37 (*)    All other components within normal limits  PROTIME-INR - Abnormal; Notable for the following components:   Prothrombin Time 21.2 (*)    INR 1.9 (*)    All other components within normal limits  DIGOXIN LEVEL  BASIC METABOLIC PANEL  CBC    EKG None  Radiology Ct Abdomen Pelvis Wo Contrast  Result Date: 01/12/2019 CLINICAL DATA:  85 y/o F; fall. Pain near the iliac crest/spine area. EXAM: CT ABDOMEN AND PELVIS WITHOUT CONTRAST CT LUMBAR SPINE WITHOUT CONTRAST TECHNIQUE: Multidetector CT imaging of the abdomen and pelvis was performed following the standard protocol without IV contrast. Multidetector CT imaging of the lumbar spine was performed without intravenous contrast administration. Multiplanar CT image reconstructions were also generated. COMPARISON:  01/12/2019 lumbar spine radiographs. 06/20/2011 CT abdomen and pelvis. FINDINGS: CT LUMBAR SPINE FINDINGS Segmentation: Transitional S1 vertebral body with S1-2 disc. Alignment: Normal lumbar lordosis without listhesis. Mild lumbar levocurvature with apex at L5. Vertebrae: No loss of vertebral body height or suspicious bone lesion. Acute mildly displaced fracture of the right posterior twelfth rib (series 2, image 44). Acute nondisplaced fractures of the right L1 and L2 transverse processes (series  2, image 40 and 53). No additional fracture or dislocation identified. Paraspinal and other soft tissues: Negative. Disc levels: Lumbar spondylosis with discogenic degenerative changes at L3-S1 with there is loss of intervertebral disc space height and vacuum phenomenon. Lumbar facet arthrosis with productive changes at L3-4, L4-5, and L5-S1. Disc and facet degenerative changes encroach on the neural foramen at the bilat L5-S1 level. No high-grade bony spinal canal stenosis. CT ABDOMEN AND PELVIS FINDINGS Lower chest: Fine reticular opacities at periphery of lung bases, likely seen on fibrosis. Minor atelectasis of the dependent lower lobes. Hepatobiliary: No focal liver abnormality is seen. Status post cholecystectomy. No biliary dilatation. Pancreas: Unremarkable. No pancreatic ductal dilatation or surrounding inflammatory changes. Spleen: Normal in size without focal abnormality. Adrenals/Urinary Tract: Adrenal glands are unremarkable. Multiple bilateral renal cysts measuring up to 3.6 cm in the left interpolar kidney are stable. Otherwise kidneys are normal, without renal calculi, focal lesion, or hydronephrosis. Left laterally directed small diverticulum of the bladder. Stomach/Bowel: Stomach is within normal limits. Appendix appears normal. No evidence of bowel wall thickening, distention, or inflammatory changes. Sigmoid diverticulosis without findings of acute diverticulitis. Vascular/Lymphatic: Aortic atherosclerosis. No enlarged abdominal or pelvic lymph nodes. Reproductive: Status post hysterectomy. No adnexal masses. Other: No abdominal wall hernia or abnormality. No abdominopelvic ascites. Musculoskeletal: No pelvic fracture or diastasis identified. Mixed lucent sclerotic changes of the femoral heads bilaterally are new from prior study and compatible with avascular necrosis. No subchondral fracture or femoral head collapse. IMPRESSION: 1. Acute mildly displaced fracture of the right posterior 12 rib.  2. Acute nondisplaced fractures of right L1 and L2 transverse processes. 3. No additional acute abnormality or fracture identified. 4. Stable chronic findings as above. Electronically Signed   By: Mitzi Hansen M.D.   On: 01/12/2019  18:12   Dg Thoracic Spine 2 View  Result Date: 01/12/2019 CLINICAL DATA:  Pain after fall from car. EXAM: THORACIC SPINE 2 VIEWS COMPARISON:  Chest radiograph June 29, 2014 FINDINGS: Old minimal T7 compression fracture. Thoracic vertebral bodies otherwise intact and aligned with maintenance of thoracic kyphosis. Intervertebral disc heights preserved. No destructive bony lesions. Osteopenia. Prevertebral and paraspinal soft tissue planes are non-suspicious. Aortic calcifications. IMPRESSION: 1. No acute fracture deformity or malalignment. 2. Osteopenia which decreases sensitivity for acute nondisplaced fractures. 3.  Aortic Atherosclerosis (ICD10-I70.0). Electronically Signed   By: Awilda Metroourtnay  Bloomer M.D.   On: 01/12/2019 16:41   Dg Lumbar Spine Complete  Result Date: 01/12/2019 CLINICAL DATA:  85 y/o  F; status post fall with back pain. EXAM: LUMBAR SPINE - COMPLETE 4+ VIEW COMPARISON:  Concurrent thoracic spine radiographs. 05/31/2010 lumbar spine MRI. FINDINGS: There is no evidence of lumbar spine fracture. Mild dextrocurvature of lumbar spine with apex at L3. Normal lumbar lordosis without listhesis. Mild loss of intervertebral disc space height with vacuum phenomenon at the L2-L5 levels. Transitional S1 vertebral body. Abdominal aortic calcific atherosclerosis. IMPRESSION: 1. No acute fracture identified. 2. Mild-to-moderate predominantly lower lumbar spondylosis. Electronically Signed   By: Mitzi HansenLance  Furusawa-Stratton M.D.   On: 01/12/2019 16:45   Ct Head Wo Contrast  Result Date: 01/12/2019 CLINICAL DATA:  Syncopal episode while getting out of shower today. EXAM: CT HEAD WITHOUT CONTRAST TECHNIQUE: Contiguous axial images were obtained from the base of the skull  through the vertex without intravenous contrast. COMPARISON:  Head CT 06/20/2011 FINDINGS: Brain: Mild age related involutional changes of the brain with chronic microvascular ischemic disease of periventricular and subcortical white matter. No acute intracranial hemorrhage, midline shift or edema. No intra-axial mass nor extra-axial fluid. No large vascular territory infarct. Midline fourth ventricle and basal cisterns without effacement. The brainstem and cerebellum are unremarkable. Idiopathic right basal ganglial calcification. Vascular: No hyperdense vessel or unexpected calcification. Atherosclerotic calcifications are noted of the left vertebral artery and both cavernous sinus carotids. Skull: Normal. Negative for fracture or focal lesion. Sinuses/Orbits: No acute finding.  Bilateral cataract extractions. Other: None IMPRESSION: 1. Age related involutional changes of brain with chronic mild small vessel ischemic disease. 2. No acute skull fracture nor acute intracranial abnormality. Electronically Signed   By: Tollie Ethavid  Kwon M.D.   On: 01/12/2019 16:03   Ct Chest Wo Contrast  Result Date: 01/12/2019 CLINICAL DATA:  85 y/o  F; status post fall. Rib pain. EXAM: CT CHEST WITHOUT CONTRAST TECHNIQUE: Multidetector CT imaging of the chest was performed following the standard protocol without IV contrast. COMPARISON:  01/12/2019 chest radiograph FINDINGS: Cardiovascular: Mild cardiomegaly. No pericardial effusion. Mitral annular calcifications. Moderate coronary artery calcific atherosclerosis and aortic calcific atherosclerosis. Normal caliber thoracic aorta and main pulmonary artery. Mediastinum/Nodes: No enlarged mediastinal or axillary lymph nodes. Thyroid gland, trachea, and esophagus demonstrate no significant findings. Lungs/Pleura: Calcified pleural plaques in the lung apices. Smooth interlobular septal thickening. Fine reticulation with peripheral and basilar predominance probably representing mild senile  fibrosis. No consolidation or pneumothorax. Upper Abdomen: No acute abnormality. Musculoskeletal: No fracture is seen. IMPRESSION: 1. No acute fracture or internal injury identified. 2. Mild cardiomegaly and interstitial edema. 3. Aortic Atherosclerosis (ICD10-I70.0). Coronary artery calcific atherosclerosis. Electronically Signed   By: Mitzi HansenLance  Furusawa-Stratton M.D.   On: 01/12/2019 21:37   Ct Cervical Spine Wo Contrast  Result Date: 01/12/2019 CLINICAL DATA:  Fall getting out of car. EXAM: CT CERVICAL SPINE WITHOUT CONTRAST TECHNIQUE: Multidetector CT imaging of  the cervical spine was performed without intravenous contrast. Multiplanar CT image reconstructions were also generated. COMPARISON:  CT HEAD January 12, 2019 FINDINGS: ALIGNMENT: Maintained lordosis. No malalignment.  Scoliosis. SKULL BASE AND VERTEBRAE: Cervical vertebral bodies and posterior elements are intact. Moderate C5-6 disc height loss and endplate spurring. Moderate facet arthropathy. No destructive bony lesions. C1-2 articulation maintained with mild arthropathy. Mild calcified pannus about the odontoid process seen with SOFT TISSUES AND SPINAL CANAL: Nonacute. Mild calcific atherosclerosis carotid bifurcations. DISC LEVELS: No high-grade osseous canal stenosis. Moderate to severe C5-6 neural foraminal narrowing. UPPER CHEST: Apical calcified pleural plaques. OTHER: None. IMPRESSION: 1. No acute fracture or malalignment. 2. Moderate to severe C5-6 neural foraminal narrowing. Electronically Signed   By: Awilda Metro M.D.   On: 01/12/2019 21:40   Ct L-spine No Charge  Result Date: 01/12/2019 CLINICAL DATA:  85 y/o F; fall. Pain near the iliac crest/spine area. EXAM: CT ABDOMEN AND PELVIS WITHOUT CONTRAST CT LUMBAR SPINE WITHOUT CONTRAST TECHNIQUE: Multidetector CT imaging of the abdomen and pelvis was performed following the standard protocol without IV contrast. Multidetector CT imaging of the lumbar spine was performed without  intravenous contrast administration. Multiplanar CT image reconstructions were also generated. COMPARISON:  01/12/2019 lumbar spine radiographs. 06/20/2011 CT abdomen and pelvis. FINDINGS: CT LUMBAR SPINE FINDINGS Segmentation: Transitional S1 vertebral body with S1-2 disc. Alignment: Normal lumbar lordosis without listhesis. Mild lumbar levocurvature with apex at L5. Vertebrae: No loss of vertebral body height or suspicious bone lesion. Acute mildly displaced fracture of the right posterior twelfth rib (series 2, image 44). Acute nondisplaced fractures of the right L1 and L2 transverse processes (series 2, image 40 and 53). No additional fracture or dislocation identified. Paraspinal and other soft tissues: Negative. Disc levels: Lumbar spondylosis with discogenic degenerative changes at L3-S1 with there is loss of intervertebral disc space height and vacuum phenomenon. Lumbar facet arthrosis with productive changes at L3-4, L4-5, and L5-S1. Disc and facet degenerative changes encroach on the neural foramen at the bilat L5-S1 level. No high-grade bony spinal canal stenosis. CT ABDOMEN AND PELVIS FINDINGS Lower chest: Fine reticular opacities at periphery of lung bases, likely seen on fibrosis. Minor atelectasis of the dependent lower lobes. Hepatobiliary: No focal liver abnormality is seen. Status post cholecystectomy. No biliary dilatation. Pancreas: Unremarkable. No pancreatic ductal dilatation or surrounding inflammatory changes. Spleen: Normal in size without focal abnormality. Adrenals/Urinary Tract: Adrenal glands are unremarkable. Multiple bilateral renal cysts measuring up to 3.6 cm in the left interpolar kidney are stable. Otherwise kidneys are normal, without renal calculi, focal lesion, or hydronephrosis. Left laterally directed small diverticulum of the bladder. Stomach/Bowel: Stomach is within normal limits. Appendix appears normal. No evidence of bowel wall thickening, distention, or inflammatory  changes. Sigmoid diverticulosis without findings of acute diverticulitis. Vascular/Lymphatic: Aortic atherosclerosis. No enlarged abdominal or pelvic lymph nodes. Reproductive: Status post hysterectomy. No adnexal masses. Other: No abdominal wall hernia or abnormality. No abdominopelvic ascites. Musculoskeletal: No pelvic fracture or diastasis identified. Mixed lucent sclerotic changes of the femoral heads bilaterally are new from prior study and compatible with avascular necrosis. No subchondral fracture or femoral head collapse. IMPRESSION: 1. Acute mildly displaced fracture of the right posterior 12 rib. 2. Acute nondisplaced fractures of right L1 and L2 transverse processes. 3. No additional acute abnormality or fracture identified. 4. Stable chronic findings as above. Electronically Signed   By: Mitzi Hansen M.D.   On: 01/12/2019 18:12   Dg Hip Unilat W Or Wo Pelvis 2-3 Views Right  Result Date: 01/12/2019 CLINICAL DATA:  85 year old female with bilateral hip and flank pain after falling backward while getting out of a car earlier today EXAM: DG HIP (WITH OR WITHOUT PELVIS) 2-3V RIGHT COMPARISON:  Concurrently obtained radiographs of the right femur FINDINGS: The bones are diffusely demineralized. No definite acute fracture or malalignment is noted. Lower lumbar degenerative disc disease. The bowel gas pattern is not obstructed. No significant degenerative changes. IMPRESSION: 1. No acute fracture or malalignment. 2. The bones appear diffusely demineralized suggesting underlying osteopenia. This can limit the detection of nondisplaced fractures. If clinical concern persists for occult fracture (inability to weightbear, severe pain, etc.) Then MRI of the pelvis would be the next imaging test of choice as CT scan can also be limited by osteopenia/osteoporosis. Electronically Signed   By: Malachy Moan M.D.   On: 01/12/2019 16:44   Dg Femur Min 2 Views Right  Result Date: 01/12/2019 CLINICAL  DATA:  85 year old female with bilateral hip, and flank pain after falling backward while getting into the car earlier today EXAM: RIGHT FEMUR 2 VIEWS COMPARISON:  Concurrently obtained radiographs of the pelvis and both hips FINDINGS: There is no evidence of fracture or other focal bone lesions. Soft tissues are unremarkable. The bones are diffusely demineralized. IMPRESSION: No acute fracture or malalignment is visualized. The bones appear diffusely demineralized. Electronically Signed   By: Malachy Moan M.D.   On: 01/12/2019 16:45    Procedures Procedures (including critical care time)  Medications Ordered in ED Medications  digoxin (LANOXIN) tablet 0.125 mg (has no administration in time range)  donepezil (ARICEPT) tablet 5 mg (has no administration in time range)  DULoxetine (CYMBALTA) DR capsule 60 mg (has no administration in time range)  traZODone (DESYREL) tablet 50 mg (has no administration in time range)  calcium carbonate (OS-CAL - dosed in mg of elemental calcium) tablet 1,250 mg (has no administration in time range)  cholecalciferol (VITAMIN D3) tablet 1,000 Units (has no administration in time range)  latanoprost (XALATAN) 0.005 % ophthalmic solution 1 drop (has no administration in time range)  timolol (TIMOPTIC) 0.5 % ophthalmic solution 1 drop (has no administration in time range)  oxyCODONE-acetaminophen (PERCOCET/ROXICET) 5-325 MG per tablet 1 tablet (1 tablet Oral Given 01/12/19 2135)  methocarbamol (ROBAXIN) tablet 500 mg (has no administration in time range)  acetaminophen (TYLENOL) tablet 650 mg (has no administration in time range)    Or  acetaminophen (TYLENOL) suppository 650 mg (has no administration in time range)  senna-docusate (Senokot-S) tablet 1 tablet (has no administration in time range)  ondansetron (ZOFRAN) tablet 4 mg (has no administration in time range)    Or  ondansetron (ZOFRAN) injection 4 mg (has no administration in time range)  hydrALAZINE  (APRESOLINE) injection 5 mg (has no administration in time range)  feeding supplement (ENSURE ENLIVE) (ENSURE ENLIVE) liquid 237 mL (has no administration in time range)  fentaNYL (SUBLIMAZE) injection 100 mcg (100 mcg Intravenous Given 01/12/19 1630)  ondansetron (ZOFRAN) injection 4 mg (4 mg Intravenous Given 01/12/19 1636)     Initial Impression / Assessment and Plan / ED Course  I have reviewed the triage vital signs and the nursing notes.  Pertinent labs & imaging results that were available during my care of the patient were reviewed by me and considered in my medical decision making (see chart for details).        85 year old female who presents for evaluation after mechanical fall.  Per family, patient is at baseline.  She does  have a history of dementia.  She is on warfarin. Patient is afebrile, non-toxic appearing, sitting comfortably on examination table. Vital signs reviewed and stable.  Patient complaining of right lower back pain that extends in the right hip.  Will plan for imaging.  Additionally, given history of warfarin, will plan for CT head.  ED head negative for any acute intracranial abnormality.  X-ray of T, L by knee and hip shows no evidence of acute fracture.  Reevaluation.  Patient still with tenderness palpation in the right paraspinal muscle of the lumbar region that extends into the hip.  Patient still having difficulty getting up and moving secondary to pain.  Will plan for further imaging for evaluation of any occult fracture.  Her scans show a acute mildly displaced fracture of the right posterior 12th rib.  Additionally, there is acute nondisplaced fractures of the right L1 and L2 transverse processes.  No evidence of hip fracture.  Discussed results with both patient and her daughter.  Patient currently lives by herself and normally ambulates with the assistance of a walker.  Daughter is concerned that patient may not be able to care for self given amount of  pain.  Attempted to ambulate patient with the assistance of a walker.  She quired another person holding onto her and was not able to take more than a few steps due to pain.  At this time, do not feel that it is safe for patient to be discharged home.  Recommend admission for pain control and possible short-term rehab placement.  Discussed patient with Dr. Clyde Lundborg (hospitalist).  Will except patient for admission.  He would like to get a CT C-spine for evaluation.  Final Clinical Impressions(s) / ED Diagnoses   Final diagnoses:  Back pain  Closed fracture of one rib of right side, initial encounter  Closed fracture of transverse process of lumbar vertebra, initial encounter [facility])  AKI (acute kidney injury) [facility])    ED Discharge Orders    None       Maxwell Caul, PA-C 01/12/19 2328    Derwood Kaplan, MD 01/13/19 1513

## 2019-01-12 NOTE — ED Notes (Signed)
Bed: WA10 Expected date:  Expected time:  Means of arrival:  Comments: EMS fall 

## 2019-01-12 NOTE — Progress Notes (Signed)
ANTICOAGULATION CONSULT NOTE   Pharmacy Consult for warfarin Indication: hx atrial fibrillation  Allergies  Allergen Reactions  . Sulfa Antibiotics Rash    Patient Measurements:   Heparin Dosing Weight:   Vital Signs: Temp: 97.6 F (36.4 C) (03/04 1507) Temp Source: Oral (03/04 1507) BP: 94/55 (03/04 1939) Pulse Rate: 100 (03/04 1939)  Labs: Recent Labs    01/12/19 1942  HGB 11.7*  HCT 38.4  PLT 257  CREATININE 1.50*    CrCl cannot be calculated (Unknown ideal weight.).   Medications:  PTA warfarin regimen:  3 mg daily except 2 mg on TTSat (last dose taken on 3/3)  Assessment: Patient is a 85 y.o F with hx afib on warfarin PTA, presented to the ED on 3/4 s/p fall.  Head CT with no acute findings.  Spine CT showed "acute mildly displaced fracture of the right posterior 12 rib" and "acute nondisplaced fractures of right L1 and L2 transverse processes".  To resume warfarin inpatient.  HD 2 mg Tu/Th/Sat and 3 mg M/W/F/Su. LD 3/3 1800  Today, 01/12/2019: INR = 1.9  Goal of Therapy:  INR 2-3 Monitor platelets by anticoagulation protocol: Yes   Plan:  - Give 3 mg warfarin x1 this am -Daily PT/INR - monitor for s/s bleeding  Lorenza Evangelist 01/12/2019,10:17 PM

## 2019-01-12 NOTE — H&P (Addendum)
History and Physical    Kendra Smith ZOX:[id number] DOB: 1926/11/05 DOA: 01/12/2019  Referring MD/NP/PA:   PCP: Patient, No Pcp Per   Patient coming from:  The patient is coming from home.  At baseline, pt is partially dependent for most of ADL.        Chief Complaint: fall and back pain  HPI: Kendra Smith is a 85 y.o. female with medical history significant of hypertension, hyperlipidemia, osteoporosis, dementia, atrial fibrillation on Coumadin, depression, who presents with fall and back pain.  Per her daughter, due to back pain, her daughter was trying to take her to the doctor's appointment, but pt has difficulty getting in the car secondary to pain, and accidentally fell to her left side when pt was being assisted to a vehicle at about 1:30 PM . No LOC. No head or neck injury. She complains of worsening back pain, which seem to be constant and severe, aggravated by any movement.  Patient denies chest pain, shortness of breath, cough.  No nausea vomiting, diarrhea or abdominal pain.  No symptoms of UTI.  No facial droop or slurred speech.  Denies incontinence or leg numbness.  Per her daughter, mental status is at her baseline.  ED Course: pt was found to have WBC 19.3, pending BMP, temperature normal, tachycardia, oxygen saturation 92 to 96%. CT of head is negative for any acute intracranial abnormality.  X-ray of right femur, pelvis and T-spin negative for fracture. CT of L spin showed L1-L2 transverse process fracture.  CT of abdomen/pelvis revealed acute mildly displaced fracture of the right posterior 12 rib and acute nondisplaced fractures of right L1 and L2 transverse. Pt is place on tele bed for obs   Review of Systems:   General: no fevers, chills, no body weight gain, has fatigue HEENT: no blurry vision, hearing changes or sore throat Respiratory: no dyspnea, coughing, wheezing CV: no chest pain, no palpitations GI: no nausea, vomiting, abdominal pain, diarrhea,  constipation GU: no dysuria, burning on urination, increased urinary frequency, hematuria  Ext: no leg edema Neuro: no unilateral weakness, numbness, or tingling, no vision change or hearing loss. Had fall. Skin: no rash, no skin tear. MSK: has lower back pain Heme: No easy bruising.  Travel history: No recent long distant travel.  Allergy:  Allergies  Allergen Reactions  . Sulfa Antibiotics Rash    Past Medical History:  Diagnosis Date  . Depression   . Glaucoma   . Hyperlipidemia   . Hypertension   . Osteoporosis     Past Surgical History:  Procedure Laterality Date  . ABDOMINAL HYSTERECTOMY    . ANKLE ARTHROSCOPY    . CHOLECYSTECTOMY    . EYE SURGERY    . RECTOCELE REPAIR      Social History:  reports that she has never smoked. She does not have any smokeless tobacco history on file. She reports that she does not drink alcohol or use drugs.  Family History: Could not be reviewed due to dementia.  Prior to Admission medications   Medication Sig Start Date End Date Taking? Authorizing Provider  CALCIUM PO Take 1 tablet by mouth daily.   Yes [provider]  digoxin (LANOXIN) 0.125 MG tablet Take 0.125 mg by mouth See admin instructions. Mon, Wed, Fri only   Yes [provider]  donepezil (ARICEPT) 5 MG tablet Take 5 mg by mouth at bedtime. 10/15/18  Yes [provider]  DULoxetine (CYMBALTA) 60 MG capsule Take 60 mg by mouth daily. 05/22/14  Yes [provider]  furosemide (LASIX) 40 MG tablet Take 20 mg by mouth See admin instructions. Tues, Thurs and Sat only 11/06/18  Yes [provider]  ibuprofen (ADVIL,MOTRIN) 200 MG tablet Take 400 mg by mouth daily as needed for moderate pain.   Yes [provider]  LUMIGAN 0.01 % SOLN Place 1 drop into both eyes daily. 06/12/14  Yes [provider]  timolol (TIMOPTIC) 0.5 % ophthalmic solution Place 1 drop into both eyes daily. 06/15/14  Yes [provider]    traZODone (DESYREL) 50 MG tablet Take 50 mg by mouth at bedtime. 10/15/18  Yes [provider]  VITAMIN D PO Take 1 tablet by mouth daily.   Yes [provider]  warfarin (COUMADIN) 2 MG tablet Take 2-3 mg by mouth See admin instructions. 2 mg on Tues, Thurs, Sat 3 mg on Mon, Wed, Fri and Sun 04/13/14  Yes [provider]  levofloxacin (LEVAQUIN) 500 MG tablet Take 1 tablet (500 mg total) by mouth daily. Patient not taking: Reported on 01/12/2019 06/29/14   Loren Racer, MD    Physical Exam: Vitals:   01/12/19 1504 01/12/19 1507 01/12/19 1939  BP:  96/62 (!) 94/55  Pulse:  (!) 105 100  Resp:  18 16  Temp:  97.6 F (36.4 C)   TempSrc:  Oral   SpO2: 96% 96% 92%   General: Not in acute distress HEENT:       Eyes: PERRL, EOMI, no scleral icterus.       ENT: No discharge from the ears and nose, no pharynx injection, no tonsillar enlargement.        Neck: No JVD, no bruit, no mass felt. Heme: No neck lymph node enlargement. Cardiac: S1/S2, RRR, No murmurs, No gallops or rubs. Respiratory:  No rales, wheezing, rhonchi or rubs. GI: Soft, nondistended, nontender, no rebound pain, no organomegaly, BS present. GU: No hematuria Ext: No pitting leg edema bilaterally. 2+DP/PT pulse bilaterally. Musculoskeletal: has tenderness in lower back both in midline and to right paraspinal muscles of the lower lumbar region.  Skin: No rashes.  Neuro: Alert, oriented to place and person, but not to time, cranial nerves II-XII grossly intact, moves all extremities.  Psych: Patient is not psychotic, no suicidal or hemocidal ideation.  No  Labs on Admission: I have personally reviewed following labs and imaging studies  CBC: Recent Labs  Lab 01/12/19 1942  WBC 19.3*  NEUTROABS 18.2*  HGB 11.7*  HCT 38.4  MCV 97.7  PLT 257   Basic Metabolic Panel: Recent Labs  Lab 01/12/19 1942  NA 139  K 3.9  CL 104  CO2 25  GLUCOSE 158*  BUN 29*  CREATININE 1.50*  CALCIUM 8.9    GFR: CrCl cannot be calculated (Unknown ideal weight.). Liver Function Tests: No results for input(s): AST, ALT, ALKPHOS, BILITOT, PROT, ALBUMIN in the last 168 hours. No results for input(s): LIPASE, AMYLASE in the last 168 hours. No results for input(s): AMMONIA in the last 168 hours. Coagulation Profile: No results for input(s): INR, PROTIME in the last 168 hours. Cardiac Enzymes: No results for input(s): CKTOTAL, CKMB, CKMBINDEX, TROPONINI in the last 168 hours. BNP (last 3 results) No results for input(s): PROBNP in the last 8760 hours. HbA1C: No results for input(s): HGBA1C in the last 72 hours. CBG: No results for input(s): GLUCAP in the last 168 hours. Lipid Profile: No results for input(s): CHOL, HDL, LDLCALC, TRIG, CHOLHDL, LDLDIRECT in the last 72 hours. Thyroid  Function Tests: No results for input(s): TSH, T4TOTAL, FREET4, T3FREE, THYROIDAB in the last 72 hours. Anemia Panel: No results for input(s): VITAMINB12, FOLATE, FERRITIN, TIBC, IRON, RETICCTPCT in the last 72 hours. Urine analysis:    Component Value Date/Time   COLORURINE YELLOW 06/22/2011 1224   APPEARANCEUR CLOUDY (A) 06/22/2011 1224   LABSPEC 1.015 06/22/2011 1224   PHURINE 5.5 06/22/2011 1224   GLUCOSEU NEGATIVE 06/22/2011 1224   HGBUR MODERATE (A) 06/22/2011 1224   BILIRUBINUR NEGATIVE 06/22/2011 1224   KETONESUR NEGATIVE 06/22/2011 1224   PROTEINUR NEGATIVE 06/22/2011 1224   UROBILINOGEN 1.0 06/22/2011 1224   NITRITE POSITIVE (A) 06/22/2011 1224   LEUKOCYTESUR SMALL (A) 06/22/2011 1224   Sepsis Labs: (procalcitonin:4,lacticidven:4) )No results found for this or any previous visit (from the past 240 hour(s)).   Radiological Exams on Admission: Ct Abdomen Pelvis Wo Contrast  Result Date: 01/12/2019 CLINICAL DATA:  85 y/o F; fall. Pain near the iliac crest/spine area. EXAM: CT ABDOMEN AND PELVIS WITHOUT CONTRAST CT LUMBAR SPINE WITHOUT CONTRAST TECHNIQUE: Multidetector CT imaging of  the abdomen and pelvis was performed following the standard protocol without IV contrast. Multidetector CT imaging of the lumbar spine was performed without intravenous contrast administration. Multiplanar CT image reconstructions were also generated. COMPARISON:  01/12/2019 lumbar spine radiographs. 06/20/2011 CT abdomen and pelvis. FINDINGS: CT LUMBAR SPINE FINDINGS Segmentation: Transitional S1 vertebral body with S1-2 disc. Alignment: Normal lumbar lordosis without listhesis. Mild lumbar levocurvature with apex at L5. Vertebrae: No loss of vertebral body height or suspicious bone lesion. Acute mildly displaced fracture of the right posterior twelfth rib (series 2, image 44). Acute nondisplaced fractures of the right L1 and L2 transverse processes (series 2, image 40 and 53). No additional fracture or dislocation identified. Paraspinal and other soft tissues: Negative. Disc levels: Lumbar spondylosis with discogenic degenerative changes at L3-S1 with there is loss of intervertebral disc space height and vacuum phenomenon. Lumbar facet arthrosis with productive changes at L3-4, L4-5, and L5-S1. Disc and facet degenerative changes encroach on the neural foramen at the bilat L5-S1 level. No high-grade bony spinal canal stenosis. CT ABDOMEN AND PELVIS FINDINGS Lower chest: Fine reticular opacities at periphery of lung bases, likely seen on fibrosis. Minor atelectasis of the dependent lower lobes. Hepatobiliary: No focal liver abnormality is seen. Status post cholecystectomy. No biliary dilatation. Pancreas: Unremarkable. No pancreatic ductal dilatation or surrounding inflammatory changes. Spleen: Normal in size without focal abnormality. Adrenals/Urinary Tract: Adrenal glands are unremarkable. Multiple bilateral renal cysts measuring up to 3.6 cm in the left interpolar kidney are stable. Otherwise kidneys are normal, without renal calculi, focal lesion, or hydronephrosis. Left laterally directed small diverticulum of  the bladder. Stomach/Bowel: Stomach is within normal limits. Appendix appears normal. No evidence of bowel wall thickening, distention, or inflammatory changes. Sigmoid diverticulosis without findings of acute diverticulitis. Vascular/Lymphatic: Aortic atherosclerosis. No enlarged abdominal or pelvic lymph nodes. Reproductive: Status post hysterectomy. No adnexal masses. Other: No abdominal wall hernia or abnormality. No abdominopelvic ascites. Musculoskeletal: No pelvic fracture or diastasis identified. Mixed lucent sclerotic changes of the femoral heads bilaterally are new from prior study and compatible with avascular necrosis. No subchondral fracture or femoral head collapse. IMPRESSION: 1. Acute mildly displaced fracture of the right posterior 12 rib. 2. Acute nondisplaced fractures of right L1 and L2 transverse processes. 3. No additional acute abnormality or fracture identified. 4. Stable chronic findings as above. Electronically Signed   By: Mitzi Hansen M.D.   On: 01/12/2019 18:12   Dg Thoracic Spine  2 View  Result Date: 01/12/2019 CLINICAL DATA:  Pain after fall from car. EXAM: THORACIC SPINE 2 VIEWS COMPARISON:  Chest radiograph June 29, 2014 FINDINGS: Old minimal T7 compression fracture. Thoracic vertebral bodies otherwise intact and aligned with maintenance of thoracic kyphosis. Intervertebral disc heights preserved. No destructive bony lesions. Osteopenia. Prevertebral and paraspinal soft tissue planes are non-suspicious. Aortic calcifications. IMPRESSION: 1. No acute fracture deformity or malalignment. 2. Osteopenia which decreases sensitivity for acute nondisplaced fractures. 3.  Aortic Atherosclerosis (ICD10-I70.0). Electronically Signed   By: Awilda Metro M.D.   On: 01/12/2019 16:41   Dg Lumbar Spine Complete  Result Date: 01/12/2019 CLINICAL DATA:  85 y/o  F; status post fall with back pain. EXAM: LUMBAR SPINE - COMPLETE 4+ VIEW COMPARISON:  Concurrent thoracic spine  radiographs. 05/31/2010 lumbar spine MRI. FINDINGS: There is no evidence of lumbar spine fracture. Mild dextrocurvature of lumbar spine with apex at L3. Normal lumbar lordosis without listhesis. Mild loss of intervertebral disc space height with vacuum phenomenon at the L2-L5 levels. Transitional S1 vertebral body. Abdominal aortic calcific atherosclerosis. IMPRESSION: 1. No acute fracture identified. 2. Mild-to-moderate predominantly lower lumbar spondylosis. Electronically Signed   By: Mitzi Hansen M.D.   On: 01/12/2019 16:45   Ct Head Wo Contrast  Result Date: 01/12/2019 CLINICAL DATA:  Syncopal episode while getting out of shower today. EXAM: CT HEAD WITHOUT CONTRAST TECHNIQUE: Contiguous axial images were obtained from the base of the skull through the vertex without intravenous contrast. COMPARISON:  Head CT 06/20/2011 FINDINGS: Brain: Mild age related involutional changes of the brain with chronic microvascular ischemic disease of periventricular and subcortical white matter. No acute intracranial hemorrhage, midline shift or edema. No intra-axial mass nor extra-axial fluid. No large vascular territory infarct. Midline fourth ventricle and basal cisterns without effacement. The brainstem and cerebellum are unremarkable. Idiopathic right basal ganglial calcification. Vascular: No hyperdense vessel or unexpected calcification. Atherosclerotic calcifications are noted of the left vertebral artery and both cavernous sinus carotids. Skull: Normal. Negative for fracture or focal lesion. Sinuses/Orbits: No acute finding.  Bilateral cataract extractions. Other: None IMPRESSION: 1. Age related involutional changes of brain with chronic mild small vessel ischemic disease. 2. No acute skull fracture nor acute intracranial abnormality. Electronically Signed   By: Tollie Eth M.D.   On: 01/12/2019 16:03   Ct L-spine No Charge  Result Date: 01/12/2019 CLINICAL DATA:  85 y/o F; fall. Pain near the iliac  crest/spine area. EXAM: CT ABDOMEN AND PELVIS WITHOUT CONTRAST CT LUMBAR SPINE WITHOUT CONTRAST TECHNIQUE: Multidetector CT imaging of the abdomen and pelvis was performed following the standard protocol without IV contrast. Multidetector CT imaging of the lumbar spine was performed without intravenous contrast administration. Multiplanar CT image reconstructions were also generated. COMPARISON:  01/12/2019 lumbar spine radiographs. 06/20/2011 CT abdomen and pelvis. FINDINGS: CT LUMBAR SPINE FINDINGS Segmentation: Transitional S1 vertebral body with S1-2 disc. Alignment: Normal lumbar lordosis without listhesis. Mild lumbar levocurvature with apex at L5. Vertebrae: No loss of vertebral body height or suspicious bone lesion. Acute mildly displaced fracture of the right posterior twelfth rib (series 2, image 44). Acute nondisplaced fractures of the right L1 and L2 transverse processes (series 2, image 40 and 53). No additional fracture or dislocation identified. Paraspinal and other soft tissues: Negative. Disc levels: Lumbar spondylosis with discogenic degenerative changes at L3-S1 with there is loss of intervertebral disc space height and vacuum phenomenon. Lumbar facet arthrosis with productive changes at L3-4, L4-5, and L5-S1. Disc and facet degenerative changes encroach  on the neural foramen at the bilat L5-S1 level. No high-grade bony spinal canal stenosis. CT ABDOMEN AND PELVIS FINDINGS Lower chest: Fine reticular opacities at periphery of lung bases, likely seen on fibrosis. Minor atelectasis of the dependent lower lobes. Hepatobiliary: No focal liver abnormality is seen. Status post cholecystectomy. No biliary dilatation. Pancreas: Unremarkable. No pancreatic ductal dilatation or surrounding inflammatory changes. Spleen: Normal in size without focal abnormality. Adrenals/Urinary Tract: Adrenal glands are unremarkable. Multiple bilateral renal cysts measuring up to 3.6 cm in the left interpolar kidney are  stable. Otherwise kidneys are normal, without renal calculi, focal lesion, or hydronephrosis. Left laterally directed small diverticulum of the bladder. Stomach/Bowel: Stomach is within normal limits. Appendix appears normal. No evidence of bowel wall thickening, distention, or inflammatory changes. Sigmoid diverticulosis without findings of acute diverticulitis. Vascular/Lymphatic: Aortic atherosclerosis. No enlarged abdominal or pelvic lymph nodes. Reproductive: Status post hysterectomy. No adnexal masses. Other: No abdominal wall hernia or abnormality. No abdominopelvic ascites. Musculoskeletal: No pelvic fracture or diastasis identified. Mixed lucent sclerotic changes of the femoral heads bilaterally are new from prior study and compatible with avascular necrosis. No subchondral fracture or femoral head collapse. IMPRESSION: 1. Acute mildly displaced fracture of the right posterior 12 rib. 2. Acute nondisplaced fractures of right L1 and L2 transverse processes. 3. No additional acute abnormality or fracture identified. 4. Stable chronic findings as above. Electronically Signed   By: Mitzi Hansen M.D.   On: 01/12/2019 18:12   Dg Hip Unilat W Or Wo Pelvis 2-3 Views Right  Result Date: 01/12/2019 CLINICAL DATA:  85 year old female with bilateral hip and flank pain after falling backward while getting out of a car earlier today EXAM: DG HIP (WITH OR WITHOUT PELVIS) 2-3V RIGHT COMPARISON:  Concurrently obtained radiographs of the right femur FINDINGS: The bones are diffusely demineralized. No definite acute fracture or malalignment is noted. Lower lumbar degenerative disc disease. The bowel gas pattern is not obstructed. No significant degenerative changes. IMPRESSION: 1. No acute fracture or malalignment. 2. The bones appear diffusely demineralized suggesting underlying osteopenia. This can limit the detection of nondisplaced fractures. If clinical concern persists for occult fracture (inability to  weightbear, severe pain, etc.) Then MRI of the pelvis would be the next imaging test of choice as CT scan can also be limited by osteopenia/osteoporosis. Electronically Signed   By: Malachy Moan M.D.   On: 01/12/2019 16:44   Dg Femur Min 2 Views Right  Result Date: 01/12/2019 CLINICAL DATA:  85 year old female with bilateral hip, and flank pain after falling backward while getting into the car earlier today EXAM: RIGHT FEMUR 2 VIEWS COMPARISON:  Concurrently obtained radiographs of the pelvis and both hips FINDINGS: There is no evidence of fracture or other focal bone lesions. Soft tissues are unremarkable. The bones are diffusely demineralized. IMPRESSION: No acute fracture or malalignment is visualized. The bones appear diffusely demineralized. Electronically Signed   By: Malachy Moan M.D.   On: 01/12/2019 16:45     EKG:   Not done in ED, will get one.   Assessment/Plan Principal Problem:   Fall Active Problems:   Depression   Hypertension   Rib fracture-12   Fracture of transverse process of lumbar vertebra L1-L2   Dementia (HCC)   Leukocytosis   Fall and rib fracture-12 and fracture of transverse process of lumbar vertebra L1-L2: CT of L spin showed L1-L2 transverse process fracture.  CT of abdomen/pelvis revealed acute mildly displaced fracture of the right posterior 12 rib and acute nondisplaced  fractures of right L1 and L2 transverse. Per her daughter, patient did not injure her head or neck, and daughter does not want to CT of C-spine, which is fine to me. Per her daughter, pt lives alone at home, with caregiver's help from 9:00 AM to 1:00 PM. Pt may need to go to rehab shortly.  - will place on tele bed for obs - Pain control:  Percocet and tylenol prn - When necessary Zofran for nausea - Robaxin for muscle spasm - PT/OT when able to (not ordered now) - consult to CM and SW -pending BMP  Addendum: CT-C spin was done before it could be canceled which is negative for  acute bony fracture. CT of Chest showed no acute fracture or internal injury identified.  Leukocytosis: Likely due to stress-induced demargination. Patient does not have signs of infection. -Follow-up CBC  Depression: no suicidal or homicidal ideations. -Continue home medications: Cymbalta  Hypertension: Bp 94/55.  -hold lasix -prn hydralazine IV  Dementia: no behavioral disturbance -donepezil  Atrial Fibrillation: CHA2DS2-VASc Score is 4, needs oral anticoagulation. Patient is on Coumadin at home. INR is  on admission. Heart rate is well controlled. -continue Coumadin per pharmacy -Continue digoxin and check digoxin level   DVT ppx: on coumadin  Code Status: DNR (I discussed with her daughter, and explained the meaning of CODE STATUS. Patient would want to be DNR per her daughter) Family Communication:  Yes, patient's daughter   at bed side Disposition Plan: to be determined, possibly to rehab  consults called:  none Admission status: Obs / tele    Date of Service 01/12/2019    Lorretta Harp [facility]   If 7PM-7AM, please contact night-coverage [email or web address] Password [email or web address] 01/12/2019, 8:59 PM

## 2019-01-13 ENCOUNTER — Encounter (HOSPITAL_COMMUNITY): Payer: Self-pay | Admitting: *Deleted

## 2019-01-13 DIAGNOSIS — F0390 Unspecified dementia without behavioral disturbance: Secondary | ICD-10-CM

## 2019-01-13 DIAGNOSIS — Z9049 Acquired absence of other specified parts of digestive tract: Secondary | ICD-10-CM | POA: Diagnosis not present

## 2019-01-13 DIAGNOSIS — R410 Disorientation, unspecified: Secondary | ICD-10-CM | POA: Diagnosis not present

## 2019-01-13 DIAGNOSIS — R4182 Altered mental status, unspecified: Secondary | ICD-10-CM | POA: Diagnosis not present

## 2019-01-13 DIAGNOSIS — F329 Major depressive disorder, single episode, unspecified: Secondary | ICD-10-CM

## 2019-01-13 DIAGNOSIS — I480 Paroxysmal atrial fibrillation: Secondary | ICD-10-CM | POA: Diagnosis present

## 2019-01-13 DIAGNOSIS — Z90710 Acquired absence of both cervix and uterus: Secondary | ICD-10-CM | POA: Diagnosis not present

## 2019-01-13 DIAGNOSIS — M2550 Pain in unspecified joint: Secondary | ICD-10-CM | POA: Diagnosis not present

## 2019-01-13 DIAGNOSIS — M810 Age-related osteoporosis without current pathological fracture: Secondary | ICD-10-CM | POA: Diagnosis present

## 2019-01-13 DIAGNOSIS — Z79899 Other long term (current) drug therapy: Secondary | ICD-10-CM | POA: Diagnosis not present

## 2019-01-13 DIAGNOSIS — Z7901 Long term (current) use of anticoagulants: Secondary | ICD-10-CM | POA: Diagnosis not present

## 2019-01-13 DIAGNOSIS — S32009D Unspecified fracture of unspecified lumbar vertebra, subsequent encounter for fracture with routine healing: Secondary | ICD-10-CM | POA: Diagnosis not present

## 2019-01-13 DIAGNOSIS — Z7401 Bed confinement status: Secondary | ICD-10-CM | POA: Diagnosis not present

## 2019-01-13 DIAGNOSIS — S32019A Unspecified fracture of first lumbar vertebra, initial encounter for closed fracture: Secondary | ICD-10-CM | POA: Diagnosis present

## 2019-01-13 DIAGNOSIS — R278 Other lack of coordination: Secondary | ICD-10-CM | POA: Diagnosis not present

## 2019-01-13 DIAGNOSIS — W19XXXD Unspecified fall, subsequent encounter: Secondary | ICD-10-CM

## 2019-01-13 DIAGNOSIS — W1830XA Fall on same level, unspecified, initial encounter: Secondary | ICD-10-CM | POA: Diagnosis present

## 2019-01-13 DIAGNOSIS — D72829 Elevated white blood cell count, unspecified: Secondary | ICD-10-CM

## 2019-01-13 DIAGNOSIS — E785 Hyperlipidemia, unspecified: Secondary | ICD-10-CM | POA: Diagnosis present

## 2019-01-13 DIAGNOSIS — R41841 Cognitive communication deficit: Secondary | ICD-10-CM | POA: Diagnosis not present

## 2019-01-13 DIAGNOSIS — S32009K Unspecified fracture of unspecified lumbar vertebra, subsequent encounter for fracture with nonunion: Secondary | ICD-10-CM | POA: Diagnosis not present

## 2019-01-13 DIAGNOSIS — E860 Dehydration: Secondary | ICD-10-CM | POA: Diagnosis present

## 2019-01-13 DIAGNOSIS — I10 Essential (primary) hypertension: Secondary | ICD-10-CM | POA: Diagnosis present

## 2019-01-13 DIAGNOSIS — M6281 Muscle weakness (generalized): Secondary | ICD-10-CM | POA: Diagnosis not present

## 2019-01-13 DIAGNOSIS — Z882 Allergy status to sulfonamides status: Secondary | ICD-10-CM | POA: Diagnosis not present

## 2019-01-13 DIAGNOSIS — Z79891 Long term (current) use of opiate analgesic: Secondary | ICD-10-CM | POA: Diagnosis not present

## 2019-01-13 DIAGNOSIS — R630 Anorexia: Secondary | ICD-10-CM | POA: Diagnosis not present

## 2019-01-13 DIAGNOSIS — S2239XG Fracture of one rib, unspecified side, subsequent encounter for fracture with delayed healing: Secondary | ICD-10-CM

## 2019-01-13 DIAGNOSIS — M549 Dorsalgia, unspecified: Secondary | ICD-10-CM | POA: Diagnosis not present

## 2019-01-13 DIAGNOSIS — R2689 Other abnormalities of gait and mobility: Secondary | ICD-10-CM | POA: Diagnosis not present

## 2019-01-13 DIAGNOSIS — N179 Acute kidney failure, unspecified: Secondary | ICD-10-CM | POA: Diagnosis present

## 2019-01-13 DIAGNOSIS — S2239XD Fracture of one rib, unspecified side, subsequent encounter for fracture with routine healing: Secondary | ICD-10-CM | POA: Diagnosis not present

## 2019-01-13 DIAGNOSIS — H409 Unspecified glaucoma: Secondary | ICD-10-CM | POA: Diagnosis present

## 2019-01-13 DIAGNOSIS — S2231XA Fracture of one rib, right side, initial encounter for closed fracture: Secondary | ICD-10-CM | POA: Diagnosis present

## 2019-01-13 DIAGNOSIS — S32029A Unspecified fracture of second lumbar vertebra, initial encounter for closed fracture: Secondary | ICD-10-CM | POA: Diagnosis present

## 2019-01-13 DIAGNOSIS — S3992XA Unspecified injury of lower back, initial encounter: Secondary | ICD-10-CM | POA: Diagnosis not present

## 2019-01-13 DIAGNOSIS — I4891 Unspecified atrial fibrillation: Secondary | ICD-10-CM | POA: Diagnosis not present

## 2019-01-13 DIAGNOSIS — Z66 Do not resuscitate: Secondary | ICD-10-CM | POA: Diagnosis present

## 2019-01-13 LAB — CBC
HCT: 39.4 % (ref 36.0–46.0)
Hemoglobin: 11.7 g/dL — ABNORMAL LOW (ref 12.0–15.0)
MCH: 29.5 pg (ref 26.0–34.0)
MCHC: 29.7 g/dL — ABNORMAL LOW (ref 30.0–36.0)
MCV: 99.2 fL (ref 80.0–100.0)
Platelets: 247 10*3/uL (ref 150–400)
RBC: 3.97 MIL/uL (ref 3.87–5.11)
RDW: 14.2 % (ref 11.5–15.5)
WBC: 9.7 10*3/uL (ref 4.0–10.5)
nRBC: 0 % (ref 0.0–0.2)

## 2019-01-13 LAB — BASIC METABOLIC PANEL
Anion gap: 5 (ref 5–15)
BUN: 26 mg/dL — ABNORMAL HIGH (ref 8–23)
CO2: 26 mmol/L (ref 22–32)
Calcium: 8.6 mg/dL — ABNORMAL LOW (ref 8.9–10.3)
Chloride: 106 mmol/L (ref 98–111)
Creatinine, Ser: 1.4 mg/dL — ABNORMAL HIGH (ref 0.44–1.00)
GFR calc Af Amer: 38 mL/min — ABNORMAL LOW (ref >60)
GFR calc non Af Amer: 33 mL/min — ABNORMAL LOW (ref >60)
Glucose, Bld: 110 mg/dL — ABNORMAL HIGH (ref 70–99)
Potassium: 4 mmol/L (ref 3.5–5.1)
Sodium: 137 mmol/L (ref 135–145)

## 2019-01-13 LAB — CK: Total CK: 33 U/L — ABNORMAL LOW (ref 38–234)

## 2019-01-13 MED ORDER — WARFARIN SODIUM 1 MG PO TABS
1.0000 mg | ORAL_TABLET | Freq: Once | ORAL | Status: AC
  Administered 2019-01-13: 1 mg via ORAL
  Filled 2019-01-13: qty 1

## 2019-01-13 MED ORDER — POLYETHYLENE GLYCOL 3350 17 G PO PACK: 17.0000 g | PACK | Freq: Every day | ORAL | Status: DC | PRN

## 2019-01-13 MED ORDER — WARFARIN - PHARMACIST DOSING INPATIENT: Freq: Every day | Status: DC

## 2019-01-13 MED ORDER — WARFARIN SODIUM 3 MG PO TABS
3.0000 mg | ORAL_TABLET | Freq: Once | ORAL | Status: AC
  Administered 2019-01-13: 3 mg via ORAL
  Filled 2019-01-13: qty 1

## 2019-01-13 NOTE — Progress Notes (Signed)
ANTICOAGULATION CONSULT NOTE   Pharmacy Consult for warfarin Indication: hx atrial fibrillation  Allergies  Allergen Reactions  . Sulfa Antibiotics Rash    Patient Measurements: Height: 5' (152.4 cm)(as per pt.) Weight: 155 lb 12.8 oz (70.7 kg) IBW/kg (Calculated) : 45.5  Vital Signs: Temp: 97.6 F (36.4 C) (03/05 1342) Temp Source: Oral (03/05 1342) BP: 140/63 (03/05 1342) Pulse Rate: 90 (03/05 1342)  Labs: Recent Labs    01/12/19 1942 01/12/19 2232 01/13/19 0533  HGB 11.7*  --  11.7*  HCT 38.4  --  39.4  PLT 257  --  247  LABPROT  --  21.2*  --   INR  --  1.9*  --   CREATININE 1.50*  --  1.40*  CKTOTAL  --  37* 33*    Estimated Creatinine Clearance: 23.4 mL/min (A) (by C-G formula based on SCr of 1.4 mg/dL (H)).   Medications:  PTA warfarin regimen:  3 mg daily except 2 mg on TTSat (last dose taken on 3/3)  Assessment: Patient is a 85 y.o F with hx afib on warfarin PTA, presented to the ED on 3/4 s/p fall.  Head CT with no acute findings.  Spine CT showed "acute mildly displaced fracture of the right posterior 12 rib" and "acute nondisplaced fractures of right L1 and L2 transverse processes".  To resume warfarin inpatient.     Baseline INR slightly subtherapeutic  HD 2 mg Tu/Th/Sat and 3 mg M/W/F/Su. LD 3/3 1800  Significant events:  Today, 01/13/2019:  CBC: Hgb low but stable; Plt WNL  INR subtherapeutic  Major drug interactions: none  No bleeding issues per nursing  Heart diet ordered  INR measured late in evening; Wednesday's warfarin dose given early Thursday AM as a result  Goal of Therapy: INR 2-3  Plan:  Warfarin 1 mg PO tonight at 18:00; no utility in checking another INR this AM, and would not give another full dose without rechecking INR. Concern for greater warfarin sensitivity d/t acute illness, but with INR subtherapeutic on admission and reduced dose today, don't anticipate we will overshoot.  Daily INR  CBC at least q72 hr  while on warfarin  Monitor for signs of bleeding or thrombosis   Bernadene Person, PharmD, BCPS [phone] 01/13/2019, 2:00 PM

## 2019-01-13 NOTE — Care Management Obs Status (Signed)
MEDICARE OBSERVATION STATUS NOTIFICATION   Patient Details  Name: Kendra Smith MRN: [id number] Date of Birth: May 23, 1926   Medicare Observation Status Notification Given:  Yes    MahabirOlegario Messier, RN 01/13/2019, 11:52 AM

## 2019-01-13 NOTE — Evaluation (Signed)
Physical Therapy Evaluation Patient Details Name: Kendra Smith MRN: [id number] DOB: 1926/03/06 Today's Date: 01/13/2019   History of Present Illness  85 yo female admitted after falling at home. Pt sustained R 12th rib fx and L1/L2 transverse process fractures. Hx of osteoporosis, dementia, A fib  Clinical Impression  On eval, pt required Max assist +2 for bed mobility and Min assist +2 for safety for ambulation. Mobility is significantly limited by pain. Pt yelled out in pain multiple times during session while mobilizing. No family present during session. Recommendation is for ST rehab at [facility]. Will continue to follow and progress activity as tolerated.     Follow Up Recommendations SNF    Equipment Recommendations  None recommended by PT    Recommendations for Other Services       Precautions / Restrictions Precautions Precautions: Fall Restrictions Weight Bearing Restrictions: No      Mobility  Bed Mobility Overal bed mobility: Needs Assistance Bed Mobility: Sidelying to Sit   Sidelying to sit: Max assist;+2 for physical assistance;+2 for safety/equipment;HOB elevated       General bed mobility comments: Assist for trunk and to scoot to EOB. Increaesed time. Pt yelled out in pain prior to bed mobility and during (possibly spasms?). Utilized bedpad to aid with scooting, positioning.   Transfers Overall transfer level: Needs assistance Equipment used: Rolling walker (2 wheeled) Transfers: Sit to/from [employer] Sit to Stand: Min assist Stand pivot transfers: Min assist       General transfer comment: Assist to rise, stabilize, control descent. VCs safety, technique. Increased time. Stand pivot, bed to recliner,with RW.   Ambulation/Gait Ambulation/Gait assistance: Min assist;+2 safety/equipment Gait Distance (Feet): 3 Feet Assistive device: Rolling walker (2 wheeled) Gait Pattern/deviations: Step-to pattern     General Gait Details: Assist  to stabilize pt and maneuver with RW.   Stairs            Wheelchair Mobility    Modified Rankin (Stroke Patients Only)       Balance Overall balance assessment: Needs assistance;History of Falls         Standing balance support: Bilateral upper extremity supported Standing balance-Leahy Scale: Poor                               Pertinent Vitals/Pain Pain Assessment: Faces Faces Pain Scale: Hurts worst Pain Location: R side Pain Descriptors / Indicators: Grimacing;Guarding;Moaning;Sharp;Spasm Pain Intervention(s): Limited activity within patient's tolerance;Repositioned    Home Living Family/patient expects to be discharged to:: Unsure Living Arrangements: Alone Available Help at Discharge: Personal care attendant(9-1)                  Prior Function                 Hand Dominance        Extremity/Trunk Assessment   Upper Extremity Assessment Upper Extremity Assessment: Defer to OT evaluation    Lower Extremity Assessment Lower Extremity Assessment: Generalized weakness    Cervical / Trunk Assessment Cervical / Trunk Assessment: Kyphotic  Communication   Communication: No difficulties  Cognition Arousal/Alertness: Awake/alert Behavior During Therapy: WFL for tasks assessed/performed Overall Cognitive Status: History of cognitive impairments - at baseline                                        General  Comments      Exercises     Assessment/Plan    PT Assessment Patient needs continued PT services  PT Problem List Decreased strength;Decreased balance;Decreased mobility;Decreased activity tolerance;Decreased cognition;Pain;Decreased knowledge of use of DME       PT Treatment Interventions DME instruction;Gait training;Therapeutic activities;Functional mobility training;Balance training;Patient/family education;Therapeutic exercise    PT Goals (Current goals can be found in the Care Plan section)   Acute Rehab PT Goals Patient Stated Goal: less pain PT Goal Formulation: With patient Time For Goal Achievement: 01/27/19 Potential to Achieve Goals: Good    Frequency Min 3X/week   Barriers to discharge        Co-evaluation               AM-PAC PT "6 Clicks" Mobility  Outcome Measure Help needed turning from your back to your side while in a flat bed without using bedrails?: A Lot Help needed moving from lying on your back to sitting on the side of a flat bed without using bedrails?: A Lot Help needed moving to and from a bed to a chair (including a wheelchair)?: A Lot Help needed standing up from a chair using your arms (e.g., wheelchair or bedside chair)?: A Little Help needed to walk in hospital room?: A Little Help needed climbing 3-5 steps with a railing? : A Lot 6 Click Score: 14    End of Session   Activity Tolerance: Patient limited by pain Patient left: in chair;with call bell/phone within reach;with chair alarm set   PT Visit Diagnosis: Pain;Muscle weakness (generalized) (M62.81);Difficulty in walking, not elsewhere classified (R26.2);History of falling (Z91.81) Pain - part of body: (r side of trunk/back)    Time: 2694-8546 PT Time Calculation (min) (ACUTE ONLY): 28 min   Charges:   PT Evaluation $PT Eval Moderate Complexity: 1 Mod PT Treatments $Therapeutic Activity: 8-22 mins         Rebeca Alert, PT Acute Rehabilitation Services Pager: [phone] Office: [phone]

## 2019-01-13 NOTE — Evaluation (Signed)
Occupational Therapy Evaluation Patient Details Name: Kendra Smith MRN: [id number] DOB: [birth year] Today's Date: 01/13/2019    History of Present Illness 85 yo female admitted after falling at home. Pt sustained R 12th rib fx and L1/L2 transverse process fractures. Hx of osteoporosis, dementia, A fib   Clinical Impression   Pt was admitted for the above.  At baseline, she lives alone and has a caregiver, 4 hours a day; caregiver assists with adls. Pt was limited by pain and needed max +2 assist for bed mobility and min A for SPT to chair.  Will focus on toileting during acute stay as she as ADL assistance. She will need SNF for rehab prior to home (vs 24/7)     Follow Up Recommendations  SNF    Equipment Recommendations  3 in 1 bedside commode    Recommendations for Other Services       Precautions / Restrictions Precautions Precautions: Fall Precaution Comments: fx L 1-2 transverse processes and 12th rib Restrictions Weight Bearing Restrictions: No      Mobility Bed Mobility Overal bed mobility: Needs Assistance Bed Mobility: Sidelying to Sit   Sidelying to sit: Max assist;+2 for physical assistance;+2 for safety/equipment;HOB elevated       General bed mobility comments: Assist for trunk and to scoot to EOB. Increaesed time. Pt yelled out in pain prior to bed mobility and during (possibly spasms?). Utilized bedpad to aid with scooting, positioning.   Transfers Overall transfer level: Needs assistance Equipment used: Rolling walker (2 wheeled) Transfers: Sit to/from [employer] Sit to Stand: Min assist Stand pivot transfers: Min assist       General transfer comment: Assist to rise, stabilize, control descent. VCs safety, technique. Increased time. Stand pivot, bed to recliner,with RW.     Balance Overall balance assessment: Needs assistance;History of Falls         Standing balance support: Bilateral upper extremity supported Standing  balance-Leahy Scale: Poor                             ADL either performed or assessed with clinical judgement   ADL Overall ADL's : Needs assistance/impaired Eating/Feeding: Set up   Grooming: Set up                   Toilet Transfer: Minimal assistance;Stand-pivot;RW(chair)   Toileting- Clothing Manipulation and Hygiene: Total assistance;Sit to/from stand         General ADL Comments: pt limited by pain/spasms.  Pt had assistance for ADLs at baseline:  likely needs more due to pain:  Mod A UB and max to total A for LB     Vision         Perception     Praxis      Pertinent Vitals/Pain Pain Assessment: Faces Faces Pain Scale: Hurts worst Pain Location: R side Pain Descriptors / Indicators: Grimacing;Guarding;Moaning;Sharp;Spasm Pain Intervention(s): Limited activity within patient's tolerance;Monitored during session;Premedicated before session;Repositioned;Patient requesting pain meds-RN notified     Hand Dominance     Extremity/Trunk Assessment Upper Extremity Assessment Upper Extremity Assessment: Generalized weakness:  Limited by pain   Lower Extremity Assessment Lower Extremity Assessment: Generalized weakness   Cervical / Trunk Assessment Cervical / Trunk Assessment: Kyphotic   Communication Communication Communication: No difficulties   Cognition Arousal/Alertness: Awake/alert Behavior During Therapy: WFL for tasks assessed/performed Overall Cognitive Status: History of cognitive impairments - at baseline  General Comments  sats remained in 90s    Exercises     Shoulder Instructions      Home Living Family/patient expects to be discharged to:: Unsure Living Arrangements: Alone Available Help at Discharge: Personal care attendant(4 hours a day)                                    Prior Functioning/Environment          Comments: had assistance with adls  from PCA        OT Problem List: Decreased strength;Decreased activity tolerance;Pain;Decreased knowledge of use of DME or AE;Impaired balance (sitting and/or standing)      OT Treatment/Interventions: Self-care/ADL training;DME and/or AE instruction;Patient/family education;Balance training;Therapeutic activities    OT Goals(Current goals can be found in the care plan section) Acute Rehab OT Goals Patient Stated Goal: less pain OT Goal Formulation: With patient Time For Goal Achievement: 01/27/19 Potential to Achieve Goals: Fair ADL Goals Pt Will Transfer to Toilet: with min guard assist;bedside commode;ambulating Pt Will Perform Toileting - Clothing Manipulation and hygiene: with min assist;sit to/from stand Additional ADL Goal #1: pt will perform bed mobility with mod A in preparation for adls/toilet transfers  OT Frequency: Min 2X/week   Barriers to D/C: Decreased caregiver support          Co-evaluation PT/OT/SLP Co-Evaluation/Treatment: Yes Reason for Co-Treatment: Complexity of the patient's impairments (multi-system involvement) PT goals addressed during session: Mobility/safety with mobility OT goals addressed during session: ADL's and self-care      AM-PAC OT "6 Clicks" Daily Activity     Outcome Measure Help from another person eating meals?: A Little Help from another person taking care of personal grooming?: A Little Help from another person toileting, which includes using toliet, bedpan, or urinal?: A Lot Help from another person bathing (including washing, rinsing, drying)?: A Lot Help from another person to put on and taking off regular upper body clothing?: A Lot Help from another person to put on and taking off regular lower body clothing?: Total 6 Click Score: 13   End of Session Nurse Communication: Mobility status;Patient requests pain meds  Activity Tolerance: Patient limited by pain Patient left: in chair;with call bell/phone within reach;with  chair alarm set  OT Visit Diagnosis: Unsteadiness on feet (R26.81);Muscle weakness (generalized) (M62.81);Pain Pain - Right/Left: Right Pain - part of body: (trunk)                Time: 1700-1749 OT Time Calculation (min): 28 min Charges:  OT General Charges $OT Visit: 1 Visit OT Evaluation $OT Eval Low Complexity: 1 Low  Marica Otter, OTR/L Acute Rehabilitation Services [phone] [facility] pager [phone] office 01/13/2019  Kendra Smith 01/13/2019, 11:20 AM

## 2019-01-13 NOTE — Care Management Note (Signed)
Case Management Note  Patient Details  Name: Chrishawn Belle MRN: [id number] Date of Birth: August 08, 1926  Subjective/Objective:  Fall. From home. Has caregiver support,& dtr support. DNR,dementia. PT recc SNF-will discuss w/dtr d/c options-currently observation status.                  Action/Plan:d/c plan home w/HHC.   Expected Discharge Date:  (unknown)               Expected Discharge Plan:  Home w Home Health Services  In-House Referral:     Discharge planning Services  CM Consult  Post Acute Care Choice:    Choice offered to:  Adult Children  DME Arranged:    DME Agency:     HH Arranged:    HH Agency:     Status of Service:  In process, will continue to follow  If discussed at Long Length of Stay Meetings, dates discussed:    Additional Comments:  Lanier Clam, RN 01/13/2019, 4:16 PM

## 2019-01-13 NOTE — Progress Notes (Signed)
PROGRESS NOTE    Cherolyn Behrle  ZOX:[id number] DOB: 09/03/1926 DOA: 01/12/2019 PCP: Patient, No Pcp Per   Brief Narrative:  85 year old female with history of essential hypertension, hyperlipidemia, atrial fibrillation on Coumadin, depression, osteoporosis was brought to the hospital after sustaining mechanical fall as patient was trying to get into the car.  She was found to have transverse process fracture of her lumbar spine, mildly displaced rib fracture.  CT of the head was negative.  She was admitted for evaluation and management with this.   Assessment & Plan:   Principal Problem:   Fall Active Problems:   Depression   Hypertension   Rib fracture-12   Fracture of transverse process of lumbar vertebra L1-L2   Dementia (HCC)   Leukocytosis   Mechanical fall causing mildly displaced rib fracture, fracture of the L1-L2 transverse process -CT of the spine and abdomen pelvis reviewed.  At this point given her comorbidities and age would prefer conservative management.  CT of the cervical spine and chest are also negative. -Proceed with pain control, bowel regimen. CK- WNL -PT/OT-recommend skilled nursing facility. -Social worker aware.  Leukocytosis -Secondary to fall.  Reactive.  No obvious signs of infection.  Hold off on antibiotics and monitor WBC.  Paroxysmal atrial fibrillation; chronic -Currently rate controlled.  Continue digoxin.  Continue Coumadin, pharmacy to manage  History of depression -Stable.  Continue home meds  Essential hypertension -Holding home Lasix that she needs gentle hydration.  Dementia without behavioral disturbances -Resume home meds   DVT prophylaxis: Coumadin Code Status: DNR Family Communication: Debara Pickett but she did not answer.  Her voicemail is full therefore could not leave a message Disposition Plan: To be determined  Consultants:   PT/OT  Procedures:   None  Antimicrobials:   None   Subjective: Reports of back  pain upon movement otherwise no complaints.  Review of Systems Otherwise negative except as per HPI, including: General: Denies fever, chills, night sweats or unintended weight loss. Resp: Denies cough, wheezing, shortness of breath. Cardiac: Denies chest pain, palpitations, orthopnea, paroxysmal nocturnal dyspnea. GI: Denies abdominal pain, nausea, vomiting, diarrhea or constipation GU: Denies dysuria, frequency, hesitancy or incontinence MS: Denies  swelling Neuro: Denies headache, neurologic deficits (focal weakness, numbness, tingling), abnormal gait Psych: Denies anxiety, depression, SI/HI/AVH Skin: Denies new rashes or lesions ID: Denies sick contacts, exotic exposures, travel  Objective: Vitals:   01/12/19 1507 01/12/19 1939 01/12/19 2225 01/13/19 0502  BP: 96/62 (!) 94/55 (!) 143/100 (!) 148/69  Pulse: (!) 105 100 98 91  Resp: Temp: 97.6 F (36.4 C)  98.1 F (36.7 C) 97.8 F (36.6 C)  TempSrc: Oral  Oral Oral  SpO2: 96% 92% 99% 93%  Weight:   70.7 kg   Height:   5' (1.524 m)     Intake/Output Summary (Last 24 hours) at 01/13/2019 1154 Last data filed at 01/13/2019 0540 Gross per 24 hour  Intake 240 ml  Output -  Net 240 ml   Filed Weights   01/12/19 2225  Weight: 70.7 kg    Examination:  General exam: Appears calm and comfortable, elderly frail appearing Respiratory system: Clear to auscultation. Respiratory effort normal. Cardiovascular system: S1 & S2 heard, RRR. No JVD, murmurs, rubs, gallops or clicks. No pedal edema. Gastrointestinal system: Abdomen is nondistended, soft and nontender. No organomegaly or masses felt. Normal bowel sounds heard. Central nervous system: Alert and oriented. No focal neurological deficits. Extremities: Symmetric 4 x 5 power. Skin: No  rashes, lesions or ulcers Psychiatry: Poor judgment.    Data Reviewed:   CBC: Recent Labs  Lab 01/12/19 1942 01/13/19 0533  WBC 19.3* 9.7  NEUTROABS 18.2*  --   HGB 11.7*  11.7*  HCT 38.4 39.4  MCV 97.7 99.2  PLT 257 247   Basic Metabolic Panel: Recent Labs  Lab 01/12/19 1942 01/13/19 0533  NA 139 137  K 3.9 4.0  CL 104 106  CO2 25 26  GLUCOSE 158* 110*  BUN 29* 26*  CREATININE 1.50* 1.40*  CALCIUM 8.9 8.6*   GFR: Estimated Creatinine Clearance: 23.4 mL/min (A) (by C-G formula based on SCr of 1.4 mg/dL (H)). Liver Function Tests: No results for input(s): AST, ALT, ALKPHOS, BILITOT, PROT, ALBUMIN in the last 168 hours. No results for input(s): LIPASE, AMYLASE in the last 168 hours. No results for input(s): AMMONIA in the last 168 hours. Coagulation Profile: Recent Labs  Lab 01/12/19 2232  INR 1.9*   Cardiac Enzymes: Recent Labs  Lab 01/12/19 2232 01/13/19 0533  CKTOTAL 37* 33*   BNP (last 3 results) No results for input(s): PROBNP in the last 8760 hours. HbA1C: No results for input(s): HGBA1C in the last 72 hours. CBG: No results for input(s): GLUCAP in the last 168 hours. Lipid Profile: No results for input(s): CHOL, HDL, LDLCALC, TRIG, CHOLHDL, LDLDIRECT in the last 72 hours. Thyroid Function Tests: No results for input(s): TSH, T4TOTAL, FREET4, T3FREE, THYROIDAB in the last 72 hours. Anemia Panel: No results for input(s): VITAMINB12, FOLATE, FERRITIN, TIBC, IRON, RETICCTPCT in the last 72 hours. Sepsis Labs: No results for input(s): PROCALCITON, LATICACIDVEN in the last 168 hours.  No results found for this or any previous visit (from the past 240 hour(s)).       Radiology Studies: Ct Abdomen Pelvis Wo Contrast  Result Date: 01/12/2019 CLINICAL DATA:  85 y/o F; fall. Pain near the iliac crest/spine area. EXAM: CT ABDOMEN AND PELVIS WITHOUT CONTRAST CT LUMBAR SPINE WITHOUT CONTRAST TECHNIQUE: Multidetector CT imaging of the abdomen and pelvis was performed following the standard protocol without IV contrast. Multidetector CT imaging of the lumbar spine was performed without intravenous contrast administration. Multiplanar  CT image reconstructions were also generated. COMPARISON:  01/12/2019 lumbar spine radiographs. 06/20/2011 CT abdomen and pelvis. FINDINGS: CT LUMBAR SPINE FINDINGS Segmentation: Transitional S1 vertebral body with S1-2 disc. Alignment: Normal lumbar lordosis without listhesis. Mild lumbar levocurvature with apex at L5. Vertebrae: No loss of vertebral body height or suspicious bone lesion. Acute mildly displaced fracture of the right posterior twelfth rib (series 2, image 44). Acute nondisplaced fractures of the right L1 and L2 transverse processes (series 2, image 40 and 53). No additional fracture or dislocation identified. Paraspinal and other soft tissues: Negative. Disc levels: Lumbar spondylosis with discogenic degenerative changes at L3-S1 with there is loss of intervertebral disc space height and vacuum phenomenon. Lumbar facet arthrosis with productive changes at L3-4, L4-5, and L5-S1. Disc and facet degenerative changes encroach on the neural foramen at the bilat L5-S1 level. No high-grade bony spinal canal stenosis. CT ABDOMEN AND PELVIS FINDINGS Lower chest: Fine reticular opacities at periphery of lung bases, likely seen on fibrosis. Minor atelectasis of the dependent lower lobes. Hepatobiliary: No focal liver abnormality is seen. Status post cholecystectomy. No biliary dilatation. Pancreas: Unremarkable. No pancreatic ductal dilatation or surrounding inflammatory changes. Spleen: Normal in size without focal abnormality. Adrenals/Urinary Tract: Adrenal glands are unremarkable. Multiple bilateral renal cysts measuring up to 3.6 cm in the left interpolar kidney are  stable. Otherwise kidneys are normal, without renal calculi, focal lesion, or hydronephrosis. Left laterally directed small diverticulum of the bladder. Stomach/Bowel: Stomach is within normal limits. Appendix appears normal. No evidence of bowel wall thickening, distention, or inflammatory changes. Sigmoid diverticulosis without findings of  acute diverticulitis. Vascular/Lymphatic: Aortic atherosclerosis. No enlarged abdominal or pelvic lymph nodes. Reproductive: Status post hysterectomy. No adnexal masses. Other: No abdominal wall hernia or abnormality. No abdominopelvic ascites. Musculoskeletal: No pelvic fracture or diastasis identified. Mixed lucent sclerotic changes of the femoral heads bilaterally are new from prior study and compatible with avascular necrosis. No subchondral fracture or femoral head collapse. IMPRESSION: 1. Acute mildly displaced fracture of the right posterior 12 rib. 2. Acute nondisplaced fractures of right L1 and L2 transverse processes. 3. No additional acute abnormality or fracture identified. 4. Stable chronic findings as above. Electronically Signed   By: Mitzi Hansen M.D.   On: 01/12/2019 18:12   Dg Thoracic Spine 2 View  Result Date: 01/12/2019 CLINICAL DATA:  Pain after fall from car. EXAM: THORACIC SPINE 2 VIEWS COMPARISON:  Chest radiograph June 29, 2014 FINDINGS: Old minimal T7 compression fracture. Thoracic vertebral bodies otherwise intact and aligned with maintenance of thoracic kyphosis. Intervertebral disc heights preserved. No destructive bony lesions. Osteopenia. Prevertebral and paraspinal soft tissue planes are non-suspicious. Aortic calcifications. IMPRESSION: 1. No acute fracture deformity or malalignment. 2. Osteopenia which decreases sensitivity for acute nondisplaced fractures. 3.  Aortic Atherosclerosis (ICD10-I70.0). Electronically Signed   By: Awilda Metro M.D.   On: 01/12/2019 16:41   Dg Lumbar Spine Complete  Result Date: 01/12/2019 CLINICAL DATA:  85 y/o  F; status post fall with back pain. EXAM: LUMBAR SPINE - COMPLETE 4+ VIEW COMPARISON:  Concurrent thoracic spine radiographs. 05/31/2010 lumbar spine MRI. FINDINGS: There is no evidence of lumbar spine fracture. Mild dextrocurvature of lumbar spine with apex at L3. Normal lumbar lordosis without listhesis. Mild loss of  intervertebral disc space height with vacuum phenomenon at the L2-L5 levels. Transitional S1 vertebral body. Abdominal aortic calcific atherosclerosis. IMPRESSION: 1. No acute fracture identified. 2. Mild-to-moderate predominantly lower lumbar spondylosis. Electronically Signed   By: Mitzi Hansen M.D.   On: 01/12/2019 16:45   Ct Head Wo Contrast  Result Date: 01/12/2019 CLINICAL DATA:  Syncopal episode while getting out of shower today. EXAM: CT HEAD WITHOUT CONTRAST TECHNIQUE: Contiguous axial images were obtained from the base of the skull through the vertex without intravenous contrast. COMPARISON:  Head CT 06/20/2011 FINDINGS: Brain: Mild age related involutional changes of the brain with chronic microvascular ischemic disease of periventricular and subcortical white matter. No acute intracranial hemorrhage, midline shift or edema. No intra-axial mass nor extra-axial fluid. No large vascular territory infarct. Midline fourth ventricle and basal cisterns without effacement. The brainstem and cerebellum are unremarkable. Idiopathic right basal ganglial calcification. Vascular: No hyperdense vessel or unexpected calcification. Atherosclerotic calcifications are noted of the left vertebral artery and both cavernous sinus carotids. Skull: Normal. Negative for fracture or focal lesion. Sinuses/Orbits: No acute finding.  Bilateral cataract extractions. Other: None IMPRESSION: 1. Age related involutional changes of brain with chronic mild small vessel ischemic disease. 2. No acute skull fracture nor acute intracranial abnormality. Electronically Signed   By: Tollie Eth M.D.   On: 01/12/2019 16:03   Ct Chest Wo Contrast  Result Date: 01/12/2019 CLINICAL DATA:  85 y/o  F; status post fall. Rib pain. EXAM: CT CHEST WITHOUT CONTRAST TECHNIQUE: Multidetector CT imaging of the chest was performed following the standard protocol without  IV contrast. COMPARISON:  01/12/2019 chest radiograph FINDINGS:  Cardiovascular: Mild cardiomegaly. No pericardial effusion. Mitral annular calcifications. Moderate coronary artery calcific atherosclerosis and aortic calcific atherosclerosis. Normal caliber thoracic aorta and main pulmonary artery. Mediastinum/Nodes: No enlarged mediastinal or axillary lymph nodes. Thyroid gland, trachea, and esophagus demonstrate no significant findings. Lungs/Pleura: Calcified pleural plaques in the lung apices. Smooth interlobular septal thickening. Fine reticulation with peripheral and basilar predominance probably representing mild senile fibrosis. No consolidation or pneumothorax. Upper Abdomen: No acute abnormality. Musculoskeletal: No fracture is seen. IMPRESSION: 1. No acute fracture or internal injury identified. 2. Mild cardiomegaly and interstitial edema. 3. Aortic Atherosclerosis (ICD10-I70.0). Coronary artery calcific atherosclerosis. Electronically Signed   By: Mitzi Hansen M.D.   On: 01/12/2019 21:37   Ct Cervical Spine Wo Contrast  Result Date: 01/12/2019 CLINICAL DATA:  Fall getting out of car. EXAM: CT CERVICAL SPINE WITHOUT CONTRAST TECHNIQUE: Multidetector CT imaging of the cervical spine was performed without intravenous contrast. Multiplanar CT image reconstructions were also generated. COMPARISON:  CT HEAD January 12, 2019 FINDINGS: ALIGNMENT: Maintained lordosis. No malalignment.  Scoliosis. SKULL BASE AND VERTEBRAE: Cervical vertebral bodies and posterior elements are intact. Moderate C5-6 disc height loss and endplate spurring. Moderate facet arthropathy. No destructive bony lesions. C1-2 articulation maintained with mild arthropathy. Mild calcified pannus about the odontoid process seen with SOFT TISSUES AND SPINAL CANAL: Nonacute. Mild calcific atherosclerosis carotid bifurcations. DISC LEVELS: No high-grade osseous canal stenosis. Moderate to severe C5-6 neural foraminal narrowing. UPPER CHEST: Apical calcified pleural plaques. OTHER: None. IMPRESSION: 1.  No acute fracture or malalignment. 2. Moderate to severe C5-6 neural foraminal narrowing. Electronically Signed   By: Awilda Metro M.D.   On: 01/12/2019 21:40   Ct L-spine No Charge  Result Date: 01/12/2019 CLINICAL DATA:  85 y/o F; fall. Pain near the iliac crest/spine area. EXAM: CT ABDOMEN AND PELVIS WITHOUT CONTRAST CT LUMBAR SPINE WITHOUT CONTRAST TECHNIQUE: Multidetector CT imaging of the abdomen and pelvis was performed following the standard protocol without IV contrast. Multidetector CT imaging of the lumbar spine was performed without intravenous contrast administration. Multiplanar CT image reconstructions were also generated. COMPARISON:  01/12/2019 lumbar spine radiographs. 06/20/2011 CT abdomen and pelvis. FINDINGS: CT LUMBAR SPINE FINDINGS Segmentation: Transitional S1 vertebral body with S1-2 disc. Alignment: Normal lumbar lordosis without listhesis. Mild lumbar levocurvature with apex at L5. Vertebrae: No loss of vertebral body height or suspicious bone lesion. Acute mildly displaced fracture of the right posterior twelfth rib (series 2, image 44). Acute nondisplaced fractures of the right L1 and L2 transverse processes (series 2, image 40 and 53). No additional fracture or dislocation identified. Paraspinal and other soft tissues: Negative. Disc levels: Lumbar spondylosis with discogenic degenerative changes at L3-S1 with there is loss of intervertebral disc space height and vacuum phenomenon. Lumbar facet arthrosis with productive changes at L3-4, L4-5, and L5-S1. Disc and facet degenerative changes encroach on the neural foramen at the bilat L5-S1 level. No high-grade bony spinal canal stenosis. CT ABDOMEN AND PELVIS FINDINGS Lower chest: Fine reticular opacities at periphery of lung bases, likely seen on fibrosis. Minor atelectasis of the dependent lower lobes. Hepatobiliary: No focal liver abnormality is seen. Status post cholecystectomy. No biliary dilatation. Pancreas: Unremarkable.  No pancreatic ductal dilatation or surrounding inflammatory changes. Spleen: Normal in size without focal abnormality. Adrenals/Urinary Tract: Adrenal glands are unremarkable. Multiple bilateral renal cysts measuring up to 3.6 cm in the left interpolar kidney are stable. Otherwise kidneys are normal, without renal calculi, focal lesion, or hydronephrosis.  Left laterally directed small diverticulum of the bladder. Stomach/Bowel: Stomach is within normal limits. Appendix appears normal. No evidence of bowel wall thickening, distention, or inflammatory changes. Sigmoid diverticulosis without findings of acute diverticulitis. Vascular/Lymphatic: Aortic atherosclerosis. No enlarged abdominal or pelvic lymph nodes. Reproductive: Status post hysterectomy. No adnexal masses. Other: No abdominal wall hernia or abnormality. No abdominopelvic ascites. Musculoskeletal: No pelvic fracture or diastasis identified. Mixed lucent sclerotic changes of the femoral heads bilaterally are new from prior study and compatible with avascular necrosis. No subchondral fracture or femoral head collapse. IMPRESSION: 1. Acute mildly displaced fracture of the right posterior 12 rib. 2. Acute nondisplaced fractures of right L1 and L2 transverse processes. 3. No additional acute abnormality or fracture identified. 4. Stable chronic findings as above. Electronically Signed   By: Mitzi Hansen M.D.   On: 01/12/2019 18:12   Dg Hip Unilat W Or Wo Pelvis 2-3 Views Right  Result Date: 01/12/2019 CLINICAL DATA:  85 year old female with bilateral hip and flank pain after falling backward while getting out of a car earlier today EXAM: DG HIP (WITH OR WITHOUT PELVIS) 2-3V RIGHT COMPARISON:  Concurrently obtained radiographs of the right femur FINDINGS: The bones are diffusely demineralized. No definite acute fracture or malalignment is noted. Lower lumbar degenerative disc disease. The bowel gas pattern is not obstructed. No significant  degenerative changes. IMPRESSION: 1. No acute fracture or malalignment. 2. The bones appear diffusely demineralized suggesting underlying osteopenia. This can limit the detection of nondisplaced fractures. If clinical concern persists for occult fracture (inability to weightbear, severe pain, etc.) Then MRI of the pelvis would be the next imaging test of choice as CT scan can also be limited by osteopenia/osteoporosis. Electronically Signed   By: Malachy Moan M.D.   On: 01/12/2019 16:44   Dg Femur Min 2 Views Right  Result Date: 01/12/2019 CLINICAL DATA:  85 year old female with bilateral hip, and flank pain after falling backward while getting into the car earlier today EXAM: RIGHT FEMUR 2 VIEWS COMPARISON:  Concurrently obtained radiographs of the pelvis and both hips FINDINGS: There is no evidence of fracture or other focal bone lesions. Soft tissues are unremarkable. The bones are diffusely demineralized. IMPRESSION: No acute fracture or malalignment is visualized. The bones appear diffusely demineralized. Electronically Signed   By: Malachy Moan M.D.   On: 01/12/2019 16:45        Scheduled Meds: . calcium carbonate  1,250 mg Oral Daily  . cholecalciferol  1,000 Units Oral Daily  . [START ON 01/14/2019] digoxin  0.125 mg Oral Q M,W,F  . donepezil  5 mg Oral QHS  . DULoxetine  60 mg Oral Daily  . feeding supplement (ENSURE ENLIVE)  237 mL Oral BID BM  . latanoprost  1 drop Both Eyes QHS  . timolol  1 drop Both Eyes Daily  . traZODone  50 mg Oral QHS  . Warfarin - Pharmacist Dosing Inpatient   Does not apply q1800   Continuous Infusions:   LOS: 0 days   Time spent= 25 mins    Efraim Vanallen Joline Maxcy, MD [facility]  If 7PM-7AM, please contact night-coverage [email or web address] 01/13/2019, 11:54 AM

## 2019-01-14 ENCOUNTER — Encounter (HOSPITAL_COMMUNITY): Payer: Self-pay | Admitting: *Deleted

## 2019-01-14 DIAGNOSIS — E860 Dehydration: Secondary | ICD-10-CM

## 2019-01-14 DIAGNOSIS — R630 Anorexia: Secondary | ICD-10-CM

## 2019-01-14 LAB — CBC
HCT: 42.8 % (ref 36.0–46.0)
Hemoglobin: 12.4 g/dL (ref 12.0–15.0)
MCH: 29.4 pg (ref 26.0–34.0)
MCHC: 29 g/dL — ABNORMAL LOW (ref 30.0–36.0)
MCV: 101.4 fL — ABNORMAL HIGH (ref 80.0–100.0)
Platelets: 252 10*3/uL (ref 150–400)
RBC: 4.22 MIL/uL (ref 3.87–5.11)
RDW: 13.8 % (ref 11.5–15.5)
WBC: 9.6 10*3/uL (ref 4.0–10.5)
nRBC: 0 % (ref 0.0–0.2)

## 2019-01-14 LAB — BASIC METABOLIC PANEL
Anion gap: 6 (ref 5–15)
BUN: 26 mg/dL — ABNORMAL HIGH (ref 8–23)
CO2: 28 mmol/L (ref 22–32)
Calcium: 9.2 mg/dL (ref 8.9–10.3)
Chloride: 102 mmol/L (ref 98–111)
Creatinine, Ser: 1.04 mg/dL — ABNORMAL HIGH (ref 0.44–1.00)
GFR calc Af Amer: 55 mL/min — ABNORMAL LOW (ref >60)
GFR calc non Af Amer: 47 mL/min — ABNORMAL LOW (ref >60)
Glucose, Bld: 105 mg/dL — ABNORMAL HIGH (ref 70–99)
Potassium: 4.6 mmol/L (ref 3.5–5.1)
Sodium: 136 mmol/L (ref 135–145)

## 2019-01-14 LAB — PROTIME-INR
INR: 1.9 — ABNORMAL HIGH (ref 0.8–1.2)
Prothrombin Time: 21.5 seconds — ABNORMAL HIGH (ref 11.4–15.2)

## 2019-01-14 LAB — MAGNESIUM: Magnesium: 2.2 mg/dL (ref 1.7–2.4)

## 2019-01-14 MED ORDER — SODIUM CHLORIDE 0.9 % IV SOLN
INTRAVENOUS | Status: DC
  Administered 2019-01-14 – 2019-01-15 (×2): via INTRAVENOUS

## 2019-01-14 MED ORDER — WARFARIN SODIUM 3 MG PO TABS
3.0000 mg | ORAL_TABLET | Freq: Once | ORAL | Status: AC
  Administered 2019-01-14: 3 mg via ORAL
  Filled 2019-01-14: qty 1

## 2019-01-14 NOTE — Progress Notes (Signed)
ANTICOAGULATION CONSULT NOTE   Pharmacy Consult for warfarin Indication: hx atrial fibrillation  Allergies  Allergen Reactions  . Sulfa Antibiotics Rash    Patient Measurements: Height: 5' (152.4 cm)(as per pt.) Weight: 155 lb 12.8 oz (70.7 kg) IBW/kg (Calculated) : 45.5  Vital Signs: Temp: 98.1 F (36.7 C) (03/06 0512) Temp Source: Oral (03/06 0512) BP: 163/70 (03/06 0512) Pulse Rate: 94 (03/06 0512)  Labs: Recent Labs    01/12/19 1942 01/12/19 2232 01/13/19 0533 01/14/19 0537  HGB 11.7*  --  11.7* 12.4  HCT 38.4  --  39.4 42.8  PLT 257  --  247 252  LABPROT  --  21.2*  --  21.5*  INR  --  1.9*  --  1.9*  CREATININE 1.50*  --  1.40* 1.04*  CKTOTAL  --  37* 33*  --     Estimated Creatinine Clearance: 31.6 mL/min (A) (by C-G formula based on SCr of 1.04 mg/dL (H)).   Medications:  PTA warfarin regimen:  3 mg daily except 2 mg on TTSat (last dose taken on 3/3)  Assessment: Patient is a 85 y.o F with hx afib on warfarin PTA, presented to the ED on 3/4 s/p fall.  Head CT with no acute findings.  Spine CT showed "acute mildly displaced fracture of the right posterior 12 rib" and "acute nondisplaced fractures of right L1 and L2 transverse processes".  To resume warfarin inpatient.     Baseline INR slightly subtherapeutic  HD 2 mg Tu/Th/Sat and 3 mg M/W/F/Su. LD 3/3 1800  Significant events:  Today, 01/14/2019:  CBC: Hgb WNL; Plt WNL  INR subtherapeutic, reduced dose given yesterday  Major drug interactions: none  No bleeding issues per nursing  Heart diet ordered  Wednesday's warfarin dose given early Thursday AM; additional 1 mg was given on 3/5 at 1800  Goal of Therapy: INR 2-3  Plan:  Warfarin 3 mg PO tonight at 18:00  Daily INR  CBC at least q72 hr while on warfarin  Monitor for signs of bleeding or thrombosis   Carolin Sicks, PharmD Candidate [facility], Class of 2020 01/14/2019, 10:52 AM

## 2019-01-14 NOTE — Clinical Social Work Note (Signed)
Clinical Social Work Assessment  Patient Details  Name: Kendra Smith MRN: [id number] Date of Birth: 01/27/1926  Date of referral:  01/14/19               Reason for consult:  Discharge Planning                Permission sought to share information with:  Family Supports Permission granted to share information::  Yes, Verbal Permission Granted  Name::     Kendra Smith  Agency::  [facility]  Relationship::  daughter  Contact Information:  [phone]  Housing/Transportation Living arrangements for the past 2 months:  Single Family Home Source of Information:  Adult Children Patient Interpreter Needed:  None Criminal Activity/Legal Involvement Pertinent to Current Situation/Hospitalization:  No - Comment as needed Significant Relationships:  Adult Children Lives with:  Self Do you feel safe going back to the place where you live?  Yes Need for family participation in patient care:  Yes (Comment)  Care giving concerns: No family at bedside. Patient is cognitive times 2 and daughter is point of contact.Patient lives alone with caregivers during the day and is alone at night   Social Worker assessment / plan:  CSW spoke with daughter Kendra Smith via phone to make her aware of discharge plans. Kendra Smith stated she would like [facility] for patient rehab since patient has been at facility in the past. Kendra Smith aware that patient will need to be inpatient status for 3 midnights for Medicare to cover patients rehab stay. Kendra Smith aware that patient will dc either Sunday or Monday and is agreeable with discharge plan  Employment status:  Retired Health and safety inspector:  Medicare PT Recommendations:  Skilled Nursing Facility Information / Referral to community resources:  Skilled Nursing Facility  Patient/Family's Response to care:  Family appreciative of care patient has received   Patient/Family's Understanding of and Emotional Response to Diagnosis, Current Treatment, and Prognosis:  Family  agreeable with patient to discharge to rehab    Emotional Assessment Appearance:  Appears stated age Attitude/Demeanor/Rapport:  Unable to Assess Affect (typically observed):  Unable to Assess Orientation:  Oriented to Self, Oriented to Place Alcohol / Substance use:  Not Applicable Psych involvement (Current and /or in the community):  No (Comment)  Discharge Needs  Concerns to be addressed:  Care Coordination Readmission within the last 30 days:  No Current discharge risk:  Dependent with Mobility Barriers to Discharge:  Continued Medical Work up   [employer], LCSW 01/14/2019, 2:44 PM

## 2019-01-14 NOTE — [place]2 (Signed)
[place] MEDICAID FL2 LEVEL OF CARE SCREENING TOOL     IDENTIFICATION  Patient Name: Kendra Smith Birthdate: Aug 17, 1926 Sex: female Admission Date (Current Location): 01/12/2019  [facility] and [place] Number:  Producer, television/film/video and Address:  [facility],  [address] [place], [place] [address]      Provider Number: [id number]  Attending Physician Name and Address:  Dimple Nanas, MD  Relative Name and Phone Number:  [name], [phone]    Current Level of Care: Hospital Recommended Level of Care: Skilled Nursing Facility Prior Approval Number:    Date Approved/Denied:   PASRR Number: [id number]  Discharge Plan: SNF    Current Diagnoses: Patient Active Problem List   Diagnosis Date Noted  . Rib fracture-12 01/12/2019  . Fracture of transverse process of lumbar vertebra L1-L2 01/12/2019  . Dementia (HCC) 01/12/2019  . Fall 01/12/2019  . Leukocytosis 01/12/2019  . Depression   . Hypertension     Orientation RESPIRATION BLADDER Height & Weight     Self, Situation  O2(2L) External catheter, Incontinent Weight: 155 lb 12.8 oz (70.7 kg) Height:  5' (152.4 cm)(as per pt.)  BEHAVIORAL SYMPTOMS/MOOD NEUROLOGICAL BOWEL NUTRITION STATUS      Continent Diet(heart healthy)  AMBULATORY STATUS COMMUNICATION OF NEEDS Skin   Extensive Assist Verbally Normal                       Personal Care Assistance Level of Assistance  Bathing, Feeding, Dressing Bathing Assistance: Maximum assistance Feeding assistance: Independent Dressing Assistance: Maximum assistance     Functional Limitations Info  Sight, Hearing, Speech Sight Info: Adequate Hearing Info: Adequate Speech Info: Adequate    SPECIAL CARE FACTORS FREQUENCY  PT (By licensed PT), OT (By licensed OT)     PT Frequency: 5x wk OT Frequency: 5x wk            Contractures Contractures Info: Not present    Additional Factors Info  Code Status, Allergies Code Status Info:  DNR Allergies Info: sulfa antibiotics           Current Medications (01/14/2019):  This is the current hospital active medication list Current Facility-Administered Medications  Medication Dose Route Frequency Provider Last Rate Last Dose  . 0.9 %  sodium chloride infusion   Intravenous Continuous Amin, Ankit Chirag, MD      . acetaminophen (TYLENOL) tablet 650 mg  650 mg Oral Q6H PRN Lorretta Harp, MD       Or  . acetaminophen (TYLENOL) suppository 650 mg  650 mg Rectal Q6H PRN Lorretta Harp, MD      . calcium carbonate (OS-CAL - dosed in mg of elemental calcium) tablet 1,250 mg  1,250 mg Oral Daily Lorretta Harp, MD   1,250 mg at 01/14/19 0954  . cholecalciferol (VITAMIN D3) tablet 1,000 Units  1,000 Units Oral Daily Lorretta Harp, MD   1,000 Units at 01/14/19 [phone]  . digoxin (LANOXIN) tablet 0.125 mg  0.125 mg Oral Q M,W,F Lorretta Harp, MD   0.125 mg at 01/14/19 0953  . donepezil (ARICEPT) tablet 5 mg  5 mg Oral QHS Lorretta Harp, MD   5 mg at 01/13/19 2220  . DULoxetine (CYMBALTA) DR capsule 60 mg  60 mg Oral Daily Lorretta Harp, MD   60 mg at 01/13/19 1001  . feeding supplement (ENSURE ENLIVE) (ENSURE ENLIVE) liquid 237 mL  237 mL Oral BID BM Lorretta Harp, MD   237 mL at 01/14/19 0955  . hydrALAZINE (  APRESOLINE) injection 5 mg  5 mg Intravenous Q2H PRN Lorretta Harp, MD      . latanoprost (XALATAN) 0.005 % ophthalmic solution 1 drop  1 drop Both Eyes QHS Lorretta Harp, MD   1 drop at 01/13/19 2234  . methocarbamol (ROBAXIN) tablet 500 mg  500 mg Oral Q8H PRN Lorretta Harp, MD   500 mg at 01/14/19 6606  . ondansetron (ZOFRAN) tablet 4 mg  4 mg Oral Q6H PRN Lorretta Harp, MD       Or  . ondansetron [facility]) injection 4 mg  4 mg Intravenous Q6H PRN Lorretta Harp, MD      . oxyCODONE-acetaminophen (PERCOCET/ROXICET) 5-325 MG per tablet 1 tablet  1 tablet Oral Q4H PRN Lorretta Harp, MD   1 tablet at 01/13/19 1357  . polyethylene glycol (MIRALAX / GLYCOLAX) packet 17 g  17 g Oral Daily PRN Amin, Ankit Chirag, MD      .  senna-docusate (Senokot-S) tablet 1 tablet  1 tablet Oral QHS PRN Lorretta Harp, MD      . timolol (TIMOPTIC) 0.5 % ophthalmic solution 1 drop  1 drop Both Eyes Daily Lorretta Harp, MD   1 drop at 01/14/19 0954  . traZODone (DESYREL) tablet 50 mg  50 mg Oral QHS Lorretta Harp, MD   50 mg at 01/13/19 2220  . Warfarin - Pharmacist Dosing Inpatient   Does not apply q1800 Lorenza Evangelist, [facility]         Discharge Medications: Please see discharge summary for a list of discharge medications.  Relevant Imaging Results:  Relevant Lab Results:   Additional Information SS# [id number]  Althea Charon, LCSW

## 2019-01-14 NOTE — Plan of Care (Signed)
Plan of care reviewed and discussed with the patient and her daughter. 

## 2019-01-14 NOTE — Progress Notes (Signed)
Initial Nutrition Assessment  DOCUMENTATION CODES:   Not applicable  INTERVENTION:  - Continue Ensure Enlive BID, each supplement provides 350 kcal and 20 grams of protein. - Continue to encourage PO intakes.    NUTRITION DIAGNOSIS:   Increased nutrient needs related to acute illness as evidenced by estimated needs.  GOAL:   Patient will meet greater than or equal to 90% of their needs  MONITOR:   PO intake, Supplement acceptance, Weight trends, Labs  REASON FOR ASSESSMENT:   Malnutrition Screening Tool  ASSESSMENT:   85 year old female with history of essential HTN, hyperlipidemia, atrial fibrillation, depression, and osteoporosis. She  was brought to the hospital after sustaining mechanical fall as she was trying to get into the car. She was found to have transverse process fracture of her lumbar spine and mildly displaced rib fracture. CT of the head was negative.  Per chart review, patient consumed 50% of breakfast and refused lunch on 3/5 and consumed 75% of breakfast this AM. Patient laying in bed grimacing throughout RD visit with daughter at bedside. Patient and daughter report that patient is in pain and that she was recently given pain medication. Daughter was preparing to place dinner meal order for patient before she had to leave for the afternoon.   Daughter reports that patient has a very good appetite now and PTA. She planned for order dinner of soup, an entree, and 2 vegetables.   Per chart review, current weight is 156 lb and no recent weight hx available. Unable to inquire about this at this time as MD called daughter to discuss patient and then daughter had to leave. Per review of order, patient has accepted all 3 bottles of Ensure offered.    Medications reviewed; 1 tablet Os-cal/day, 1000 units cholecalciferol/day. Labs reviewed; BUN: 26 mg/dl, creatinine: 8.54 mg/dl, GFR: 47 ml/min. IVF; NS @ 75 ml/hr.     NUTRITION - FOCUSED PHYSICAL EXAM:  Will  attempt at follow-up.   Diet Order:   Diet Order            Diet Heart Room service appropriate? Yes; Fluid consistency: Thin  Diet effective now              EDUCATION NEEDS:   No education needs have been identified at this time  Skin:  Skin Assessment: Reviewed RN Assessment  Last BM:  PTA/unknown  Height:   Ht Readings from Last 1 Encounters:  01/12/19 5' (1.524 m)    Weight:   Wt Readings from Last 1 Encounters:  01/12/19 70.7 kg    Ideal Body Weight:  45.45 kg  BMI:  Body mass index is 30.43 kg/m.  Estimated Nutritional Needs:   Kcal:  1415-1625 kcal  Protein:  65-75 grams  Fluid:  >/= 1.5 L/day     Trenton Gammon, MS, RD, LDN, [facility] Inpatient Clinical Dietitian Pager # [phone] After hours/weekend pager # [phone]

## 2019-01-14 NOTE — Progress Notes (Signed)
PROGRESS NOTE    Kendra Smith  YQM:[id number] DOB: November 28, 1925 DOA: 01/12/2019 PCP: Patient, No Pcp Per   Brief Narrative:  85 year old female with history of essential hypertension, hyperlipidemia, atrial fibrillation on Coumadin, depression, osteoporosis was brought to the hospital after sustaining mechanical fall as patient was trying to get into the car.  She was found to have transverse process fracture of her lumbar spine, mildly displaced rib fracture.  CT of the head was negative.  She was admitted for evaluation and management with this.   Assessment & Plan:   Principal Problem:   Fall Active Problems:   Depression   Hypertension   Rib fracture-12   Fracture of transverse process of lumbar vertebra L1-L2   Dementia (HCC)   Leukocytosis   Mechanical fall causing mildly displaced rib fracture, fracture of the L1-L2 transverse process -CT of the spine and abdomen pelvis reviewed.  At this point given her comorbidities and age would prefer conservative management.  CT of the cervical spine and chest are also negative. -Proceed with pain control, bowel regimen. CK- WNL -PT/OT-SNF; SW aware.   Poor Oral intake with mild clinical dehydration -We will give normal saline for 24 hours IV  Leukocytosis; improved.  -Secondary to fall.  Reactive.  No obvious signs of infection.  Hold off on antibiotics and monitor WBC.   Paroxysmal atrial fibrillation; chronic -Currently rate controlled.  Continue digoxin.  Continue Coumadin, pharmacy to manage, INR 1.9. Dig level low.   History of depression -Stable.  Continue home meds  Essential hypertension -Gentle hydration. Hold home Meds. Prn hydralazine.   Dementia without behavioral disturbances -Resume home meds  PT/OT evaluation performed.  SNF recommended.  SNF appropriate as the patient has received 3 days of hospital care (or the 3 days stay Has been made by the patient's insurance company) and is felt to need rehab services  to restore this patient to their prior level of function to achieve safe transition back to home care.  This patient needs rehab services for at least 5 days/week and skilled nursing services daily to facilitate this transition.  Rehab is been requested as the most appropriate discharge option for this patient and is not felt to be custodial care as evidenced by mostly independently ambulatory with nmininal assistance = Prior level of functioning.     DVT prophylaxis: Coumadin Code Status: DNR Family Communication: None at Bedside.  Disposition Plan: TBD  Consultants:   PT/OT  Procedures:   None  Antimicrobials:   None   Subjective: Reports of pain with movement but little better.  Due to her off-and-on pain, her appetite is still not adequate  Review of Systems Otherwise negative except as per HPI, including: General = no fevers, chills, dizziness, malaise, fatigue HEENT/EYES = negative for pain, redness, loss of vision, double vision, blurred vision, loss of hearing, sore throat, hoarseness, dysphagia Cardiovascular= negative for chest pain, palpitation, murmurs, lower extremity swelling Respiratory/lungs= negative for shortness of breath, cough, hemoptysis, wheezing, mucus production Gastrointestinal= negative for nausea, vomiting,, abdominal pain, melena, hematemesis Genitourinary= negative for Dysuria, Hematuria, Change in Urinary Frequency MSK = Negative for arthralgia, myalgias, Back Pain, Joint swelling  Neurology= Negative for headache, seizures, numbness, tingling  Psychiatry= Negative for anxiety, depression, suicidal and homocidal ideation Allergy/Immunology= Medication/Food allergy as listed  Skin= Negative for Rash, lesions, ulcers, itching   Objective: Vitals:   01/13/19 1342 01/13/19 2027 01/13/19 2042 01/14/19 0512  BP: 140/63 (!) 141/54 138/64 (!) 163/70  Pulse: 90 91 91 94  Resp: 18 18 18 18   Temp: 97.6 F (36.4 C) 98.5 F (36.9 C) 98.2 F (36.8 C)  98.1 F (36.7 C)  TempSrc: Oral Oral Oral Oral  SpO2: 98% 95% 98% 99%  Weight:      Height:        Intake/Output Summary (Last 24 hours) at 01/14/2019 1148 Last data filed at 01/14/2019 0846 Gross per 24 hour  Intake 240 ml  Output 600 ml  Net -360 ml   Filed Weights   01/12/19 2225  Weight: 70.7 kg    Examination:  Constitutional: NAD, calm, comfortable Eyes: PERRL, lids and conjunctivae normal ENMT: Mucous membranes are dry posterior pharynx clear of any exudate or lesions.Normal dentition.  Neck: normal, supple, no masses, no thyromegaly Respiratory: Slight diminished breath sounds at the bases Cardiovascular: Regular rate and rhythm, no murmurs / rubs / gallops. No extremity edema. 2+ pedal pulses. No carotid bruits.  Abdomen: no tenderness, no masses palpated. No hepatosplenomegaly. Bowel sounds positive.  Musculoskeletal: no clubbing / cyanosis. No joint deformity upper and lower extremities. Good ROM, no contractures. Normal muscle tone.  Skin: no rashes, lesions, ulcers. No induration Neurologic: CN 2-12 grossly intact. Sensation intact, DTR normal. Strength 4/5 in all 4.  Psychiatric: Normal judgment and insight. Alert and oriented x 3. Normal mood.    Data Reviewed:   CBC: Recent Labs  Lab 01/12/19 1942 01/13/19 0533 01/14/19 0537  WBC 19.3* 9.7 9.6  NEUTROABS 18.2*  --   --   HGB 11.7* 11.7* 12.4  HCT 38.4 39.4 42.8  MCV 97.7 99.2 101.4*  PLT 257 247 252   Basic Metabolic Panel: Recent Labs  Lab 01/12/19 1942 01/13/19 0533 01/14/19 0537  NA 139 137 136  K 3.9 4.0 4.6  CL 104 106 102  CO2 25 26 28   GLUCOSE 158* 110* 105*  BUN 29* 26* 26*  CREATININE 1.50* 1.40* 1.04*  CALCIUM 8.9 8.6* 9.2  MG  --   --  2.2   GFR: Estimated Creatinine Clearance: 31.6 mL/min (A) (by C-G formula based on SCr of 1.04 mg/dL (H)). Liver Function Tests: No results for input(s): AST, ALT, ALKPHOS, BILITOT, PROT, ALBUMIN in the last 168 hours. No results for  input(s): LIPASE, AMYLASE in the last 168 hours. No results for input(s): AMMONIA in the last 168 hours. Coagulation Profile: Recent Labs  Lab 01/12/19 2232 01/14/19 0537  INR 1.9* 1.9*   Cardiac Enzymes: Recent Labs  Lab 01/12/19 2232 01/13/19 0533  CKTOTAL 37* 33*   BNP (last 3 results) No results for input(s): PROBNP in the last 8760 hours. HbA1C: No results for input(s): HGBA1C in the last 72 hours. CBG: No results for input(s): GLUCAP in the last 168 hours. Lipid Profile: No results for input(s): CHOL, HDL, LDLCALC, TRIG, CHOLHDL, LDLDIRECT in the last 72 hours. Thyroid Function Tests: No results for input(s): TSH, T4TOTAL, FREET4, T3FREE, THYROIDAB in the last 72 hours. Anemia Panel: No results for input(s): VITAMINB12, FOLATE, FERRITIN, TIBC, IRON, RETICCTPCT in the last 72 hours. Sepsis Labs: No results for input(s): PROCALCITON, LATICACIDVEN in the last 168 hours.  No results found for this or any previous visit (from the past 240 hour(s)).       Radiology Studies: Ct Abdomen Pelvis Wo Contrast  Result Date: 01/12/2019 CLINICAL DATA:  85 y/o F; fall. Pain near the iliac crest/spine area. EXAM: CT ABDOMEN AND PELVIS WITHOUT CONTRAST CT LUMBAR SPINE WITHOUT CONTRAST TECHNIQUE: Multidetector CT imaging of the abdomen and pelvis was performed  following the standard protocol without IV contrast. Multidetector CT imaging of the lumbar spine was performed without intravenous contrast administration. Multiplanar CT image reconstructions were also generated. COMPARISON:  01/12/2019 lumbar spine radiographs. 06/20/2011 CT abdomen and pelvis. FINDINGS: CT LUMBAR SPINE FINDINGS Segmentation: Transitional S1 vertebral body with S1-2 disc. Alignment: Normal lumbar lordosis without listhesis. Mild lumbar levocurvature with apex at L5. Vertebrae: No loss of vertebral body height or suspicious bone lesion. Acute mildly displaced fracture of the right posterior twelfth rib (series 2,  image 44). Acute nondisplaced fractures of the right L1 and L2 transverse processes (series 2, image 40 and 53). No additional fracture or dislocation identified. Paraspinal and other soft tissues: Negative. Disc levels: Lumbar spondylosis with discogenic degenerative changes at L3-S1 with there is loss of intervertebral disc space height and vacuum phenomenon. Lumbar facet arthrosis with productive changes at L3-4, L4-5, and L5-S1. Disc and facet degenerative changes encroach on the neural foramen at the bilat L5-S1 level. No high-grade bony spinal canal stenosis. CT ABDOMEN AND PELVIS FINDINGS Lower chest: Fine reticular opacities at periphery of lung bases, likely seen on fibrosis. Minor atelectasis of the dependent lower lobes. Hepatobiliary: No focal liver abnormality is seen. Status post cholecystectomy. No biliary dilatation. Pancreas: Unremarkable. No pancreatic ductal dilatation or surrounding inflammatory changes. Spleen: Normal in size without focal abnormality. Adrenals/Urinary Tract: Adrenal glands are unremarkable. Multiple bilateral renal cysts measuring up to 3.6 cm in the left interpolar kidney are stable. Otherwise kidneys are normal, without renal calculi, focal lesion, or hydronephrosis. Left laterally directed small diverticulum of the bladder. Stomach/Bowel: Stomach is within normal limits. Appendix appears normal. No evidence of bowel wall thickening, distention, or inflammatory changes. Sigmoid diverticulosis without findings of acute diverticulitis. Vascular/Lymphatic: Aortic atherosclerosis. No enlarged abdominal or pelvic lymph nodes. Reproductive: Status post hysterectomy. No adnexal masses. Other: No abdominal wall hernia or abnormality. No abdominopelvic ascites. Musculoskeletal: No pelvic fracture or diastasis identified. Mixed lucent sclerotic changes of the femoral heads bilaterally are new from prior study and compatible with avascular necrosis. No subchondral fracture or femoral  head collapse. IMPRESSION: 1. Acute mildly displaced fracture of the right posterior 12 rib. 2. Acute nondisplaced fractures of right L1 and L2 transverse processes. 3. No additional acute abnormality or fracture identified. 4. Stable chronic findings as above. Electronically Signed   By: Mitzi Hansen M.D.   On: 01/12/2019 18:12   Dg Thoracic Spine 2 View  Result Date: 01/12/2019 CLINICAL DATA:  Pain after fall from car. EXAM: THORACIC SPINE 2 VIEWS COMPARISON:  Chest radiograph June 29, 2014 FINDINGS: Old minimal T7 compression fracture. Thoracic vertebral bodies otherwise intact and aligned with maintenance of thoracic kyphosis. Intervertebral disc heights preserved. No destructive bony lesions. Osteopenia. Prevertebral and paraspinal soft tissue planes are non-suspicious. Aortic calcifications. IMPRESSION: 1. No acute fracture deformity or malalignment. 2. Osteopenia which decreases sensitivity for acute nondisplaced fractures. 3.  Aortic Atherosclerosis (ICD10-I70.0). Electronically Signed   By: Awilda Metro M.D.   On: 01/12/2019 16:41   Dg Lumbar Spine Complete  Result Date: 01/12/2019 CLINICAL DATA:  85 y/o  F; status post fall with back pain. EXAM: LUMBAR SPINE - COMPLETE 4+ VIEW COMPARISON:  Concurrent thoracic spine radiographs. 05/31/2010 lumbar spine MRI. FINDINGS: There is no evidence of lumbar spine fracture. Mild dextrocurvature of lumbar spine with apex at L3. Normal lumbar lordosis without listhesis. Mild loss of intervertebral disc space height with vacuum phenomenon at the L2-L5 levels. Transitional S1 vertebral body. Abdominal aortic calcific atherosclerosis. IMPRESSION: 1. No  acute fracture identified. 2. Mild-to-moderate predominantly lower lumbar spondylosis. Electronically Signed   By: Mitzi Hansen M.D.   On: 01/12/2019 16:45   Ct Head Wo Contrast  Result Date: 01/12/2019 CLINICAL DATA:  Syncopal episode while getting out of shower today. EXAM: CT HEAD  WITHOUT CONTRAST TECHNIQUE: Contiguous axial images were obtained from the base of the skull through the vertex without intravenous contrast. COMPARISON:  Head CT 06/20/2011 FINDINGS: Brain: Mild age related involutional changes of the brain with chronic microvascular ischemic disease of periventricular and subcortical white matter. No acute intracranial hemorrhage, midline shift or edema. No intra-axial mass nor extra-axial fluid. No large vascular territory infarct. Midline fourth ventricle and basal cisterns without effacement. The brainstem and cerebellum are unremarkable. Idiopathic right basal ganglial calcification. Vascular: No hyperdense vessel or unexpected calcification. Atherosclerotic calcifications are noted of the left vertebral artery and both cavernous sinus carotids. Skull: Normal. Negative for fracture or focal lesion. Sinuses/Orbits: No acute finding.  Bilateral cataract extractions. Other: None IMPRESSION: 1. Age related involutional changes of brain with chronic mild small vessel ischemic disease. 2. No acute skull fracture nor acute intracranial abnormality. Electronically Signed   By: Tollie Eth M.D.   On: 01/12/2019 16:03   Ct Chest Wo Contrast  Result Date: 01/12/2019 CLINICAL DATA:  85 y/o  F; status post fall. Rib pain. EXAM: CT CHEST WITHOUT CONTRAST TECHNIQUE: Multidetector CT imaging of the chest was performed following the standard protocol without IV contrast. COMPARISON:  01/12/2019 chest radiograph FINDINGS: Cardiovascular: Mild cardiomegaly. No pericardial effusion. Mitral annular calcifications. Moderate coronary artery calcific atherosclerosis and aortic calcific atherosclerosis. Normal caliber thoracic aorta and main pulmonary artery. Mediastinum/Nodes: No enlarged mediastinal or axillary lymph nodes. Thyroid gland, trachea, and esophagus demonstrate no significant findings. Lungs/Pleura: Calcified pleural plaques in the lung apices. Smooth interlobular septal thickening.  Fine reticulation with peripheral and basilar predominance probably representing mild senile fibrosis. No consolidation or pneumothorax. Upper Abdomen: No acute abnormality. Musculoskeletal: No fracture is seen. IMPRESSION: 1. No acute fracture or internal injury identified. 2. Mild cardiomegaly and interstitial edema. 3. Aortic Atherosclerosis (ICD10-I70.0). Coronary artery calcific atherosclerosis. Electronically Signed   By: Mitzi Hansen M.D.   On: 01/12/2019 21:37   Ct Cervical Spine Wo Contrast  Result Date: 01/12/2019 CLINICAL DATA:  Fall getting out of car. EXAM: CT CERVICAL SPINE WITHOUT CONTRAST TECHNIQUE: Multidetector CT imaging of the cervical spine was performed without intravenous contrast. Multiplanar CT image reconstructions were also generated. COMPARISON:  CT HEAD January 12, 2019 FINDINGS: ALIGNMENT: Maintained lordosis. No malalignment.  Scoliosis. SKULL BASE AND VERTEBRAE: Cervical vertebral bodies and posterior elements are intact. Moderate C5-6 disc height loss and endplate spurring. Moderate facet arthropathy. No destructive bony lesions. C1-2 articulation maintained with mild arthropathy. Mild calcified pannus about the odontoid process seen with SOFT TISSUES AND SPINAL CANAL: Nonacute. Mild calcific atherosclerosis carotid bifurcations. DISC LEVELS: No high-grade osseous canal stenosis. Moderate to severe C5-6 neural foraminal narrowing. UPPER CHEST: Apical calcified pleural plaques. OTHER: None. IMPRESSION: 1. No acute fracture or malalignment. 2. Moderate to severe C5-6 neural foraminal narrowing. Electronically Signed   By: Awilda Metro M.D.   On: 01/12/2019 21:40   Ct L-spine No Charge  Result Date: 01/12/2019 CLINICAL DATA:  85 y/o F; fall. Pain near the iliac crest/spine area. EXAM: CT ABDOMEN AND PELVIS WITHOUT CONTRAST CT LUMBAR SPINE WITHOUT CONTRAST TECHNIQUE: Multidetector CT imaging of the abdomen and pelvis was performed following the standard protocol  without IV contrast. Multidetector CT imaging of the  lumbar spine was performed without intravenous contrast administration. Multiplanar CT image reconstructions were also generated. COMPARISON:  01/12/2019 lumbar spine radiographs. 06/20/2011 CT abdomen and pelvis. FINDINGS: CT LUMBAR SPINE FINDINGS Segmentation: Transitional S1 vertebral body with S1-2 disc. Alignment: Normal lumbar lordosis without listhesis. Mild lumbar levocurvature with apex at L5. Vertebrae: No loss of vertebral body height or suspicious bone lesion. Acute mildly displaced fracture of the right posterior twelfth rib (series 2, image 44). Acute nondisplaced fractures of the right L1 and L2 transverse processes (series 2, image 40 and 53). No additional fracture or dislocation identified. Paraspinal and other soft tissues: Negative. Disc levels: Lumbar spondylosis with discogenic degenerative changes at L3-S1 with there is loss of intervertebral disc space height and vacuum phenomenon. Lumbar facet arthrosis with productive changes at L3-4, L4-5, and L5-S1. Disc and facet degenerative changes encroach on the neural foramen at the bilat L5-S1 level. No high-grade bony spinal canal stenosis. CT ABDOMEN AND PELVIS FINDINGS Lower chest: Fine reticular opacities at periphery of lung bases, likely seen on fibrosis. Minor atelectasis of the dependent lower lobes. Hepatobiliary: No focal liver abnormality is seen. Status post cholecystectomy. No biliary dilatation. Pancreas: Unremarkable. No pancreatic ductal dilatation or surrounding inflammatory changes. Spleen: Normal in size without focal abnormality. Adrenals/Urinary Tract: Adrenal glands are unremarkable. Multiple bilateral renal cysts measuring up to 3.6 cm in the left interpolar kidney are stable. Otherwise kidneys are normal, without renal calculi, focal lesion, or hydronephrosis. Left laterally directed small diverticulum of the bladder. Stomach/Bowel: Stomach is within normal limits.  Appendix appears normal. No evidence of bowel wall thickening, distention, or inflammatory changes. Sigmoid diverticulosis without findings of acute diverticulitis. Vascular/Lymphatic: Aortic atherosclerosis. No enlarged abdominal or pelvic lymph nodes. Reproductive: Status post hysterectomy. No adnexal masses. Other: No abdominal wall hernia or abnormality. No abdominopelvic ascites. Musculoskeletal: No pelvic fracture or diastasis identified. Mixed lucent sclerotic changes of the femoral heads bilaterally are new from prior study and compatible with avascular necrosis. No subchondral fracture or femoral head collapse. IMPRESSION: 1. Acute mildly displaced fracture of the right posterior 12 rib. 2. Acute nondisplaced fractures of right L1 and L2 transverse processes. 3. No additional acute abnormality or fracture identified. 4. Stable chronic findings as above. Electronically Signed   By: Mitzi Hansen M.D.   On: 01/12/2019 18:12   Dg Hip Unilat W Or Wo Pelvis 2-3 Views Right  Result Date: 01/12/2019 CLINICAL DATA:  85 year old female with bilateral hip and flank pain after falling backward while getting out of a car earlier today EXAM: DG HIP (WITH OR WITHOUT PELVIS) 2-3V RIGHT COMPARISON:  Concurrently obtained radiographs of the right femur FINDINGS: The bones are diffusely demineralized. No definite acute fracture or malalignment is noted. Lower lumbar degenerative disc disease. The bowel gas pattern is not obstructed. No significant degenerative changes. IMPRESSION: 1. No acute fracture or malalignment. 2. The bones appear diffusely demineralized suggesting underlying osteopenia. This can limit the detection of nondisplaced fractures. If clinical concern persists for occult fracture (inability to weightbear, severe pain, etc.) Then MRI of the pelvis would be the next imaging test of choice as CT scan can also be limited by osteopenia/osteoporosis. Electronically Signed   By: Malachy Moan  M.D.   On: 01/12/2019 16:44   Dg Femur Min 2 Views Right  Result Date: 01/12/2019 CLINICAL DATA:  85 year old female with bilateral hip, and flank pain after falling backward while getting into the car earlier today EXAM: RIGHT FEMUR 2 VIEWS COMPARISON:  Concurrently obtained radiographs of the  pelvis and both hips FINDINGS: There is no evidence of fracture or other focal bone lesions. Soft tissues are unremarkable. The bones are diffusely demineralized. IMPRESSION: No acute fracture or malalignment is visualized. The bones appear diffusely demineralized. Electronically Signed   By: Malachy Moan M.D.   On: 01/12/2019 16:45        Scheduled Meds: . calcium carbonate  1,250 mg Oral Daily  . cholecalciferol  1,000 Units Oral Daily  . digoxin  0.125 mg Oral Q M,W,F  . donepezil  5 mg Oral QHS  . DULoxetine  60 mg Oral Daily  . feeding supplement (ENSURE ENLIVE)  237 mL Oral BID BM  . latanoprost  1 drop Both Eyes QHS  . timolol  1 drop Both Eyes Daily  . traZODone  50 mg Oral QHS  . Warfarin - Pharmacist Dosing Inpatient   Does not apply q1800   Continuous Infusions:   LOS: 1 day   Time spent= 25 mins    Elnor Renovato Joline Maxcy, MD [facility]  If 7PM-7AM, please contact night-coverage [email or web address] 01/14/2019, 11:48 AM

## 2019-01-14 NOTE — Progress Notes (Signed)
Physical Therapy Treatment Patient Details Name: Kendra Smith MRN: [id number] DOB: 12/27/1925 Today's Date: 01/14/2019    History of Present Illness 85 yo female admitted after falling at home. Pt sustained R 12th rib fx and L1/L2 transverse process fractures. Hx of osteoporosis, dementia, A fib    PT Comments    Pt assisted OOB and ambulated within room to recliner. Pt requiring at least min-mod assist for mobility at this time.  Pt reports pain with activity and eases off with rest.  Continue to recommend SNF upon d/c.  Family member present and reports pt has caregiver for ADLs 4 hrs in morning and 4 hrs in evening however pt alone at night.  Pt typically able to mobilize with RW in household.    Follow Up Recommendations  SNF     Equipment Recommendations  None recommended by PT    Recommendations for Other Services       Precautions / Restrictions Precautions Precautions: Fall Precaution Comments: fx L 1-2 transverse processes and 12th rib    Mobility  Bed Mobility Overal bed mobility: Needs Assistance Bed Mobility: Rolling;Sidelying to Sit Rolling: Min assist Sidelying to sit: Min assist       General bed mobility comments: log roll to left side to assist with pain control, pt with increased pain during transitional movements, assist for lower body with rolling and trunk upright  Transfers Overall transfer level: Needs assistance Equipment used: Rolling walker (2 wheeled) Transfers: Sit to/from Stand Sit to Stand: Min assist         General transfer comment: assist to rise and steady, verbal cues for safe technique  Ambulation/Gait Ambulation/Gait assistance: Min guard Gait Distance (Feet): 12 Feet Assistive device: Rolling walker (2 wheeled) Gait Pattern/deviations: Step-to pattern;Narrow base of support     General Gait Details: small shuffling steps, encouraged weight bearing on RW for more pain control if needed, only able to tolerate ambulating  around bed to recliner.   Stairs             Wheelchair Mobility    Modified Rankin (Stroke Patients Only)       Balance Overall balance assessment: History of Falls                                          Cognition Arousal/Alertness: Awake/alert Behavior During Therapy: WFL for tasks assessed/performed Overall Cognitive Status: History of cognitive impairments - at baseline                                        Exercises      General Comments        Pertinent Vitals/Pain Pain Assessment: Faces Faces Pain Scale: Hurts even more Pain Location: R side Pain Descriptors / Indicators: Grimacing;Guarding;Moaning Pain Intervention(s): Repositioned;Monitored during session    Home Living                      Prior Function            PT Goals (current goals can now be found in the care plan section) Progress towards PT goals: Progressing toward goals    Frequency    Min 3X/week      PT Plan Current plan remains appropriate    Co-evaluation  AM-PAC PT "6 Clicks" Mobility   Outcome Measure  Help needed turning from your back to your side while in a flat bed without using bedrails?: A Lot Help needed moving from lying on your back to sitting on the side of a flat bed without using bedrails?: A Lot Help needed moving to and from a bed to a chair (including a wheelchair)?: A Lot Help needed standing up from a chair using your arms (e.g., wheelchair or bedside chair)?: A Little Help needed to walk in hospital room?: A Little Help needed climbing 3-5 steps with a railing? : A Lot 6 Click Score: 14    End of Session Equipment Utilized During Treatment: Gait belt Activity Tolerance: Patient limited by pain Patient left: in chair;with call bell/phone within reach;with chair alarm set;with family/visitor present Nurse Communication: Mobility status PT Visit Diagnosis: Muscle weakness  (generalized) (M62.81);Difficulty in walking, not elsewhere classified (R26.2)     Time: 4132-4401 PT Time Calculation (min) (ACUTE ONLY): 23 min  Charges:  $Gait Training: 8-22 mins                     Zenovia Jarred, PT, DPT Acute Rehabilitation Services Office: [phone] Pager: [phone]  Sarajane Jews 01/14/2019, 1:36 PM

## 2019-01-15 LAB — PROTIME-INR
INR: 1.9 — ABNORMAL HIGH (ref 0.8–1.2)
Prothrombin Time: 21.6 seconds — ABNORMAL HIGH (ref 11.4–15.2)

## 2019-01-15 LAB — BASIC METABOLIC PANEL
Anion gap: 5 (ref 5–15)
BUN: 20 mg/dL (ref 8–23)
CO2: 29 mmol/L (ref 22–32)
Calcium: 9 mg/dL (ref 8.9–10.3)
Chloride: 104 mmol/L (ref 98–111)
Creatinine, Ser: 1 mg/dL (ref 0.44–1.00)
GFR calc Af Amer: 57 mL/min — ABNORMAL LOW (ref >60)
GFR calc non Af Amer: 50 mL/min — ABNORMAL LOW (ref >60)
Glucose, Bld: 103 mg/dL — ABNORMAL HIGH (ref 70–99)
Potassium: 4.5 mmol/L (ref 3.5–5.1)
Sodium: 138 mmol/L (ref 135–145)

## 2019-01-15 LAB — CBC
HCT: 38.8 % (ref 36.0–46.0)
Hemoglobin: 11.4 g/dL — ABNORMAL LOW (ref 12.0–15.0)
MCH: 29.2 pg (ref 26.0–34.0)
MCHC: 29.4 g/dL — ABNORMAL LOW (ref 30.0–36.0)
MCV: 99.5 fL (ref 80.0–100.0)
Platelets: 264 10*3/uL (ref 150–400)
RBC: 3.9 MIL/uL (ref 3.87–5.11)
RDW: 13.8 % (ref 11.5–15.5)
WBC: 8 10*3/uL (ref 4.0–10.5)
nRBC: 0 % (ref 0.0–0.2)

## 2019-01-15 LAB — MAGNESIUM: Magnesium: 2 mg/dL (ref 1.7–2.4)

## 2019-01-15 MED ORDER — SODIUM CHLORIDE 0.9 % IV SOLN
INTRAVENOUS | Status: AC
  Administered 2019-01-15: 15:00:00 via INTRAVENOUS

## 2019-01-15 MED ORDER — WARFARIN SODIUM 2 MG PO TABS
2.0000 mg | ORAL_TABLET | Freq: Once | ORAL | Status: AC
  Administered 2019-01-15: 2 mg via ORAL
  Filled 2019-01-15: qty 1

## 2019-01-15 NOTE — Progress Notes (Signed)
PROGRESS NOTE    Kendra Smith  UJW:[id number] DOB: [birth year] DOA: 01/12/2019 PCP: Patient, No Pcp Per   Brief Narrative:  85 year old female with history of essential hypertension, hyperlipidemia, atrial fibrillation on Coumadin, depression, osteoporosis was brought to the hospital after sustaining mechanical fall as patient was trying to get into the car.  She was found to have transverse process fracture of her lumbar spine, mildly displaced rib fracture.  CT of the head was negative.  She was admitted for evaluation and management with this.  Physical therapy recommended skilled nursing facility therefore arrangements are to be made.   Assessment & Plan:   Principal Problem:   Fall Active Problems:   Depression   Hypertension   Rib fracture-12   Fracture of transverse process of lumbar vertebra L1-L2   Dementia (HCC)   Leukocytosis   Mechanical fall causing mildly displaced rib fracture, fracture of the L1-L2 transverse process -CT of the spine and abdomen pelvis reviewed.  At this point given her comorbidities and age would prefer conservative management.  CT of the cervical spine and chest are also negative. -Pain control, bowel regimen.  CK within normal limits. -PT/OT recommend skilled nursing facility  Poor Oral intake with mild clinical dehydration -Slowly improving but due to inconsistent appetite she would benefit from more IV fluids.  Leukocytosis; improved.  -Resolved  Paroxysmal atrial fibrillation; chronic -Currently rate controlled.  Continue digoxin.  Continue Coumadin, pharmacy to manage, INR 1.9. Dig level low.   History of depression -Stable.  Continue home meds  Essential hypertension -Gentle hydration. Hold home Meds. Prn hydralazine.   Dementia without behavioral disturbances -Resume home meds  PT/OT evaluation performed.  SNF recommended.  SNF appropriate as the patient has received 3 days of hospital care (or the 3 days stay Has been made by  the patient's insurance company) and is felt to need rehab services to restore this patient to their prior level of function to achieve safe transition back to home care.  This patient needs rehab services for at least 5 days/week and skilled nursing services daily to facilitate this transition.  Rehab is been requested as the most appropriate discharge option for this patient and is not felt to be custodial care as evidenced by mostly independently ambulatory with nmininal assistance = Prior level of functioning.     DVT prophylaxis: Coumadin Code Status: DNR Family Communication: None at Bedside.  Disposition Plan: We will work on skilled nursing facility placement but in the meantime we will continue to hydrate her  Consultants:   PT/OT  Procedures:   None  Antimicrobials:   None   Subjective: Reports of pain with movement.  Inconsistent p.o. intake.    Review of Systems Otherwise negative except as per HPI, including: General = no fevers, chills, dizziness, malaise, fatigue HEENT/EYES = negative for pain, redness, loss of vision, double vision, blurred vision, loss of hearing, sore throat, hoarseness, dysphagia Cardiovascular= negative for chest pain, palpitation, murmurs, lower extremity swelling Respiratory/lungs= negative for shortness of breath, cough, hemoptysis, wheezing, mucus production Gastrointestinal= negative for nausea, vomiting,, abdominal pain, melena, hematemesis Genitourinary= negative for Dysuria, Hematuria, Change in Urinary Frequency MSK = Negative for arthralgia, myalgias, Back Pain, Joint swelling  Neurology= Negative for headache, seizures, numbness, tingling  Psychiatry= Negative for anxiety, depression, suicidal and homocidal ideation Allergy/Immunology= Medication/Food allergy as listed  Skin= Negative for Rash, lesions, ulcers, itching   Objective: Vitals:   01/14/19 1257 01/14/19 1925 01/14/19 2033 01/15/19 0516  BP: 118/60  105/65  112/65    Pulse: 85 88 90 87  Resp: 16 16 16 16   Temp: 98.8 F (37.1 C)  99 F (37.2 C) 98.8 F (37.1 C)  TempSrc: Oral  Oral Oral  SpO2: 92%  90% 93%  Weight:      Height:        Intake/Output Summary (Last 24 hours) at 01/15/2019 1038 Last data filed at 01/15/2019 0600 Gross per 24 hour  Intake 1220.61 ml  Output 550 ml  Net 670.61 ml   Filed Weights   01/12/19 2225  Weight: 70.7 kg    Examination:  Constitutional: NAD, calm, comfortable Eyes: PERRL, lids and conjunctivae normal ENMT: Mucous membranes are dry posterior pharynx clear of any exudate or lesions.Normal dentition.  Neck: normal, supple, no masses, no thyromegaly Respiratory: Diminished breath sounds at the bases Cardiovascular: Regular rate and rhythm, no murmurs / rubs / gallops. No extremity edema. 2+ pedal pulses. No carotid bruits.  Abdomen: no tenderness, no masses palpated. No hepatosplenomegaly. Bowel sounds positive.  Musculoskeletal: no clubbing / cyanosis. No joint deformity upper and lower extremities. Good ROM, no contractures. Normal muscle tone.  Skin: no rashes, lesions, ulcers. No induration Neurologic: CN 2-12 grossly intact. Sensation intact, DTR normal. Strength 4/5 in all 4.  Psychiatric: Normal judgment and insight. Alert and oriented x 3. Normal mood.   Data Reviewed:   CBC: Recent Labs  Lab 01/12/19 1942 01/13/19 0533 01/14/19 0537 01/15/19 0454  WBC 19.3* 9.7 9.6 8.0  NEUTROABS 18.2*  --   --   --   HGB 11.7* 11.7* 12.4 11.4*  HCT 38.4 39.4 42.8 38.8  MCV 97.7 99.2 101.4* 99.5  PLT 257 247 252 264   Basic Metabolic Panel: Recent Labs  Lab 01/12/19 1942 01/13/19 0533 01/14/19 0537 01/15/19 0454  NA 139 137 136 138  K 3.9 4.0 4.6 4.5  CL 104 106 102 104  CO2 25 26 28 29   GLUCOSE 158* 110* 105* 103*  BUN 29* 26* 26* 20  CREATININE 1.50* 1.40* 1.04* 1.00  CALCIUM 8.9 8.6* 9.2 9.0  MG  --   --  2.2 2.0   GFR: Estimated Creatinine Clearance: 32.8 mL/min (by C-G formula based  on SCr of 1 mg/dL). Liver Function Tests: No results for input(s): AST, ALT, ALKPHOS, BILITOT, PROT, ALBUMIN in the last 168 hours. No results for input(s): LIPASE, AMYLASE in the last 168 hours. No results for input(s): AMMONIA in the last 168 hours. Coagulation Profile: Recent Labs  Lab 01/12/19 2232 01/14/19 0537 01/15/19 0454  INR 1.9* 1.9* 1.9*   Cardiac Enzymes: Recent Labs  Lab 01/12/19 2232 01/13/19 0533  CKTOTAL 37* 33*   BNP (last 3 results) No results for input(s): PROBNP in the last 8760 hours. HbA1C: No results for input(s): HGBA1C in the last 72 hours. CBG: No results for input(s): GLUCAP in the last 168 hours. Lipid Profile: No results for input(s): CHOL, HDL, LDLCALC, TRIG, CHOLHDL, LDLDIRECT in the last 72 hours. Thyroid Function Tests: No results for input(s): TSH, T4TOTAL, FREET4, T3FREE, THYROIDAB in the last 72 hours. Anemia Panel: No results for input(s): VITAMINB12, FOLATE, FERRITIN, TIBC, IRON, RETICCTPCT in the last 72 hours. Sepsis Labs: No results for input(s): PROCALCITON, LATICACIDVEN in the last 168 hours.  No results found for this or any previous visit (from the past 240 hour(s)).       Radiology Studies: No results found.      Scheduled Meds: . calcium carbonate  1,250 mg Oral Daily  .  cholecalciferol  1,000 Units Oral Daily  . digoxin  0.125 mg Oral Q M,W,F  . donepezil  5 mg Oral QHS  . DULoxetine  60 mg Oral Daily  . feeding supplement (ENSURE ENLIVE)  237 mL Oral BID BM  . latanoprost  1 drop Both Eyes QHS  . timolol  1 drop Both Eyes Daily  . traZODone  50 mg Oral QHS  . warfarin  2 mg Oral ONCE-1800  . Warfarin - Pharmacist Dosing Inpatient   Does not apply q1800   Continuous Infusions: . sodium chloride 75 mL/hr at 01/15/19 0629     LOS: 2 days   Time spent= 25 mins    Ankit Joline Maxcy, MD [facility]  If 7PM-7AM, please contact night-coverage [email or web address] 01/15/2019, 10:38 AM

## 2019-01-15 NOTE — Progress Notes (Signed)
ANTICOAGULATION CONSULT NOTE   Pharmacy Consult for warfarin Indication: hx atrial fibrillation  Allergies  Allergen Reactions  . Sulfa Antibiotics Rash    Patient Measurements: Height: 5' (152.4 cm)(as per pt.) Weight: 155 lb 12.8 oz (70.7 kg) IBW/kg (Calculated) : 45.5  Vital Signs: Temp: 98.8 F (37.1 C) (03/07 0516) Temp Source: Oral (03/07 0516) BP: 112/65 (03/07 0516) Pulse Rate: 87 (03/07 0516)  Labs: Recent Labs    01/12/19 2232 01/13/19 0533 01/14/19 0537 01/15/19 0454  HGB  --  11.7* 12.4 11.4*  HCT  --  39.4 42.8 38.8  PLT  --  247 252 264  LABPROT 21.2*  --  21.5* 21.6*  INR 1.9*  --  1.9* 1.9*  CREATININE  --  1.40* 1.04* 1.00  CKTOTAL 37* 33*  --   --     Estimated Creatinine Clearance: 32.8 mL/min (by C-G formula based on SCr of 1 mg/dL).   Medications:  PTA warfarin regimen:  3 mg daily except 2 mg on TTSat (last dose taken on 3/3)  Assessment: Patient is a 85 y.o F with hx afib on warfarin PTA, presented to the ED on 3/4 s/p fall.  Head CT with no acute findings.  Spine CT showed "acute mildly displaced fracture of the right posterior 12 rib" and "acute nondisplaced fractures of right L1 and L2 transverse processes".  To resume warfarin inpatient.     Baseline INR slightly subtherapeutic  HD 2 mg Tu/Th/Sat and 3 mg M/W/F/Su. LD 3/3 1800  Significant events:  Today, 01/15/2019:  INR 1.9 subtherapeutic  Hgb down slightly, Plts WNL  Major drug interactions: none  No bleeding issues per nursing  Heart diet ordered, poor intake charted  Wednesday's warfarin dose given early Thursday AM; additional 1 mg was given on 3/5 at 1800  Goal of Therapy: INR 2-3  Plan:  Warfarin 2 mg PO tonight at 18:00  Daily INR  CBC at least q72 hr while on warfarin  Monitor for signs of bleeding or thrombosis   Arley Phenix RPh 01/15/2019, 9:39 AM Pager [phone]

## 2019-01-16 DIAGNOSIS — Z7401 Bed confinement status: Secondary | ICD-10-CM | POA: Diagnosis not present

## 2019-01-16 DIAGNOSIS — F0390 Unspecified dementia without behavioral disturbance: Secondary | ICD-10-CM | POA: Diagnosis not present

## 2019-01-16 DIAGNOSIS — R410 Disorientation, unspecified: Secondary | ICD-10-CM | POA: Diagnosis not present

## 2019-01-16 DIAGNOSIS — S32009D Unspecified fracture of unspecified lumbar vertebra, subsequent encounter for fracture with routine healing: Secondary | ICD-10-CM | POA: Diagnosis not present

## 2019-01-16 DIAGNOSIS — M2550 Pain in unspecified joint: Secondary | ICD-10-CM | POA: Diagnosis not present

## 2019-01-16 DIAGNOSIS — S32018D Other fracture of first lumbar vertebra, subsequent encounter for fracture with routine healing: Secondary | ICD-10-CM | POA: Diagnosis not present

## 2019-01-16 DIAGNOSIS — R278 Other lack of coordination: Secondary | ICD-10-CM | POA: Diagnosis not present

## 2019-01-16 DIAGNOSIS — S3992XA Unspecified injury of lower back, initial encounter: Secondary | ICD-10-CM | POA: Diagnosis not present

## 2019-01-16 DIAGNOSIS — M6281 Muscle weakness (generalized): Secondary | ICD-10-CM | POA: Diagnosis not present

## 2019-01-16 DIAGNOSIS — S32009A Unspecified fracture of unspecified lumbar vertebra, initial encounter for closed fracture: Secondary | ICD-10-CM | POA: Diagnosis not present

## 2019-01-16 DIAGNOSIS — D72829 Elevated white blood cell count, unspecified: Secondary | ICD-10-CM | POA: Diagnosis not present

## 2019-01-16 DIAGNOSIS — M810 Age-related osteoporosis without current pathological fracture: Secondary | ICD-10-CM | POA: Diagnosis not present

## 2019-01-16 DIAGNOSIS — R42 Dizziness and giddiness: Secondary | ICD-10-CM | POA: Diagnosis not present

## 2019-01-16 DIAGNOSIS — I10 Essential (primary) hypertension: Secondary | ICD-10-CM | POA: Diagnosis not present

## 2019-01-16 DIAGNOSIS — E785 Hyperlipidemia, unspecified: Secondary | ICD-10-CM | POA: Diagnosis not present

## 2019-01-16 DIAGNOSIS — F329 Major depressive disorder, single episode, unspecified: Secondary | ICD-10-CM | POA: Diagnosis not present

## 2019-01-16 DIAGNOSIS — W19XXXD Unspecified fall, subsequent encounter: Secondary | ICD-10-CM | POA: Diagnosis not present

## 2019-01-16 DIAGNOSIS — R4182 Altered mental status, unspecified: Secondary | ICD-10-CM | POA: Diagnosis not present

## 2019-01-16 DIAGNOSIS — I480 Paroxysmal atrial fibrillation: Secondary | ICD-10-CM | POA: Diagnosis not present

## 2019-01-16 DIAGNOSIS — S2239XD Fracture of one rib, unspecified side, subsequent encounter for fracture with routine healing: Secondary | ICD-10-CM | POA: Diagnosis not present

## 2019-01-16 DIAGNOSIS — R41841 Cognitive communication deficit: Secondary | ICD-10-CM | POA: Diagnosis not present

## 2019-01-16 DIAGNOSIS — S22009A Unspecified fracture of unspecified thoracic vertebra, initial encounter for closed fracture: Secondary | ICD-10-CM | POA: Diagnosis not present

## 2019-01-16 DIAGNOSIS — S2239XG Fracture of one rib, unspecified side, subsequent encounter for fracture with delayed healing: Secondary | ICD-10-CM | POA: Diagnosis not present

## 2019-01-16 DIAGNOSIS — S32009K Unspecified fracture of unspecified lumbar vertebra, subsequent encounter for fracture with nonunion: Secondary | ICD-10-CM | POA: Diagnosis not present

## 2019-01-16 DIAGNOSIS — R2689 Other abnormalities of gait and mobility: Secondary | ICD-10-CM | POA: Diagnosis not present

## 2019-01-16 DIAGNOSIS — I4891 Unspecified atrial fibrillation: Secondary | ICD-10-CM | POA: Diagnosis not present

## 2019-01-16 DIAGNOSIS — S2231XD Fracture of one rib, right side, subsequent encounter for fracture with routine healing: Secondary | ICD-10-CM | POA: Diagnosis not present

## 2019-01-16 LAB — BASIC METABOLIC PANEL
Anion gap: 6 (ref 5–15)
BUN: 16 mg/dL (ref 8–23)
CO2: 27 mmol/L (ref 22–32)
Calcium: 9 mg/dL (ref 8.9–10.3)
Chloride: 104 mmol/L (ref 98–111)
Creatinine, Ser: 0.87 mg/dL (ref 0.44–1.00)
GFR calc Af Amer: >60 mL/min (ref >60)
GFR calc non Af Amer: 59 mL/min — ABNORMAL LOW (ref >60)
Glucose, Bld: 114 mg/dL — ABNORMAL HIGH (ref 70–99)
Potassium: 3.9 mmol/L (ref 3.5–5.1)
Sodium: 137 mmol/L (ref 135–145)

## 2019-01-16 LAB — CBC
HCT: 39.1 % (ref 36.0–46.0)
Hemoglobin: 11.7 g/dL — ABNORMAL LOW (ref 12.0–15.0)
MCH: 29.3 pg (ref 26.0–34.0)
MCHC: 29.9 g/dL — ABNORMAL LOW (ref 30.0–36.0)
MCV: 97.8 fL (ref 80.0–100.0)
Platelets: 290 10*3/uL (ref 150–400)
RBC: 4 MIL/uL (ref 3.87–5.11)
RDW: 13.5 % (ref 11.5–15.5)
WBC: 11.1 10*3/uL — ABNORMAL HIGH (ref 4.0–10.5)
nRBC: 0 % (ref 0.0–0.2)

## 2019-01-16 LAB — PROTIME-INR
INR: 1.7 — ABNORMAL HIGH (ref 0.8–1.2)
Prothrombin Time: 19.5 seconds — ABNORMAL HIGH (ref 11.4–15.2)

## 2019-01-16 LAB — MAGNESIUM: Magnesium: 2 mg/dL (ref 1.7–2.4)

## 2019-01-16 MED ORDER — OXYCODONE-ACETAMINOPHEN 5-325 MG PO TABS: 1.0000 | ORAL_TABLET | ORAL | 0 refills | Status: AC | PRN

## 2019-01-16 MED ORDER — WARFARIN SODIUM 3 MG PO TABS
3.0000 mg | ORAL_TABLET | Freq: Once | ORAL | Status: DC
  Filled 2019-01-16: qty 1

## 2019-01-16 MED ORDER — SENNOSIDES-DOCUSATE SODIUM 8.6-50 MG PO TABS: 1.0000 | ORAL_TABLET | Freq: Every evening | ORAL | 0 refills | Status: DC | PRN

## 2019-01-16 MED ORDER — POLYETHYLENE GLYCOL 3350 17 G PO PACK: 17.0000 g | PACK | Freq: Every day | ORAL | 0 refills | Status: DC | PRN

## 2019-01-16 NOTE — Progress Notes (Signed)
Pt admitting to [facility] SNF- [phone]. Daughter notified- is going to facility to complete admission paperwork and CSW will arrange non-emergency ambulance transport once complete. DC information sent on the HUB.  Ilean Skill, MSW, LCSW Clinical Social Work 01/16/2019 [phone] weekend coverage

## 2019-01-16 NOTE — Progress Notes (Signed)
Pt discharged from the unit via [facility]. Discharge instructions were sent with transport. Report called to [facility] report given to [facility], RN. No questions or concerns at this time.

## 2019-01-16 NOTE — Care Management Important Message (Signed)
Important Message  Patient Details  Name: Kendra Smith MRN: [id number] Date of Birth: [birth year]   Medicare Important Message Given:  Yes    Elliot Cousin, RN 01/16/2019, 2:13 PM

## 2019-01-16 NOTE — Discharge Summary (Signed)
Physician Discharge Summary  Kendra Smith WJX:[id number] DOB: [birth year] DOA: 01/12/2019  PCP: Patient, No Pcp Per  Admit date: 01/12/2019 Discharge date: 01/16/2019  Admitted From: Home Disposition: Skilled nursing facility  Recommendations for Outpatient Follow-up:  1. Follow up with PCP in 1-2 weeks 2. Please obtain BMP/CBC in one week your next doctors visit.  3. Pain medications with bowel regimen prescribed  Discharge Condition: Stable CODE STATUS: DNR Diet recommendation: 2 g salt diet  Brief/Interim Summary: 85 year old female with history of essential hypertension, hyperlipidemia, atrial fibrillation on Coumadin, depression, osteoporosis was brought to the hospital after sustaining mechanical fall as patient was trying to get into the car.  She was found to have transverse process fracture of her lumbar spine, mildly displaced rib fracture.  CT of the head was negative.  She was admitted for evaluation and management with this.  Physical therapy recommended skilled nursing facility therefore arrangements are to be made. Patient has reached maximal benefit from hospital stay and stable for discharge when bed is available at skilled nursing facility.   Discharge Diagnoses:  Principal Problem:   Fall Active Problems:   Depression   Hypertension   Rib fracture-12   Fracture of transverse process of lumbar vertebra L1-L2   Dementia (HCC)   Leukocytosis  Mechanical fall causing mildly displaced rib fracture, fracture of the L1-L2 transverse process -CT of the spine and abdomen pelvis reviewed.  At this point given her comorbidities and age would prefer conservative management.  CT of the cervical spine and chest are also negative. -Pain control, bowel regimen.  CK within normal limits. -PT/OT recommend skilled nursing facility, social worker to make arrangements  Poor Oral intake with mild clinical dehydration -Slowly improving but due to inconsistent appetite she would  benefit from more IV fluids.  Leukocytosis; improved.  -Resolved  Paroxysmal atrial fibrillation; chronic -Currently rate controlled.  Continue digoxin.  Continue Coumadin, pharmacy to manage. Dig level low.   History of depression -Stable.  Continue home meds  Essential hypertension -Gentle hydration. Hold home Meds. Prn hydralazine.   Dementia without behavioral disturbances -Resume home meds   DVT prophylaxis: Coumadin Code Status: DNR Family Communication: None at Bedside.  Disposition Plan:  Discharge to skilled nursing facility when bed available  Consultations:  None  Subjective: Feels okay except pain with movement.  No other complaints.  Discharge Exam: Vitals:   01/15/19 2154 01/16/19 0522  BP: 133/68 129/73  Pulse: 82 (!) 102  Resp: 20 16  Temp: 98.4 F (36.9 C) 98.3 F (36.8 C)  SpO2: 96% 93%   Vitals:   01/15/19 0516 01/15/19 1438 01/15/19 2154 01/16/19 0522  BP: 112/65 120/63 133/68 129/73  Pulse: 87 84 82 (!) 102  Resp: 16 20 20 16   Temp: 98.8 F (37.1 C) 98.6 F (37 C) 98.4 F (36.9 C) 98.3 F (36.8 C)  TempSrc: Oral Oral Oral Oral  SpO2: 93% 93% 96% 93%  Weight:      Height:        General: Pt is alert, awake, not in acute distress Cardiovascular: RRR, S1/S2 +, no rubs, no gallops Respiratory: CTA bilaterally, no wheezing, no rhonchi Abdominal: Soft, NT, ND, bowel sounds + Extremities: no edema, no cyanosis  Discharge Instructions   Allergies as of 01/16/2019      Reactions   Sulfa Antibiotics Rash      Medication List    TAKE these medications   CALCIUM PO Take 1 tablet by mouth daily.   digoxin 0.125 MG  tablet Commonly known as:  LANOXIN Take 0.125 mg by mouth See admin instructions. Mon, Wed, Fri only   donepezil 5 MG tablet Commonly known as:  ARICEPT Take 5 mg by mouth at bedtime.   DULoxetine 60 MG capsule Commonly known as:  CYMBALTA Take 60 mg by mouth daily.   furosemide 40 MG tablet Commonly  known as:  LASIX Take 20 mg by mouth See admin instructions. Tues, Thurs and Sat only   ibuprofen 200 MG tablet Commonly known as:  ADVIL,MOTRIN Take 400 mg by mouth daily as needed for moderate pain.   levofloxacin 500 MG tablet Commonly known as:  LEVAQUIN Take 1 tablet (500 mg total) by mouth daily.   Lumigan 0.01 % Soln Generic drug:  bimatoprost Place 1 drop into both eyes daily.   oxyCODONE-acetaminophen 5-325 MG tablet Commonly known as:  PERCOCET/ROXICET Take 1 tablet by mouth every 4 (four) hours as needed for up to 5 days for moderate pain or severe pain.   polyethylene glycol packet Commonly known as:  MIRALAX / GLYCOLAX Take 17 g by mouth daily as needed for moderate constipation or severe constipation.   senna-docusate 8.6-50 MG tablet Commonly known as:  Senokot-S Take 1 tablet by mouth at bedtime as needed for up to 30 doses for mild constipation.   timolol 0.5 % ophthalmic solution Commonly known as:  TIMOPTIC Place 1 drop into both eyes daily.   traZODone 50 MG tablet Commonly known as:  DESYREL Take 50 mg by mouth at bedtime.   VITAMIN D PO Take 1 tablet by mouth daily.   warfarin 2 MG tablet Commonly known as:  COUMADIN Take 2-3 mg by mouth See admin instructions. 2 mg on Tues, Thurs, Sat 3 mg on Mon, Wed, Fri and Sun       Solicitor information for follow-up providers    [facility]. Schedule an appointment as soon as possible for a visit in 2 week(s).   Contact information: [address] [place] [address] [phone]           Contact information for after-discharge care    Destination    [facility] .   Service:  Skilled Nursing Contact information: [address] [place] [address] [phone]                 Allergies  Allergen Reactions  . Sulfa Antibiotics Rash    You were cared for by a hospitalist during  your hospital stay. If you have any questions about your discharge medications or the care you received while you were in the hospital after you are discharged, you can call the unit and asked to speak with the hospitalist on call if the hospitalist that took care of you is not available. Once you are discharged, your primary care physician will handle any further medical issues. Please note that no refills for any discharge medications will be authorized once you are discharged, as it is imperative that you return to your primary care physician (or establish a relationship with a primary care physician if you do not have one) for your aftercare needs so that they can reassess your need for medications and monitor your lab values.   Procedures/Studies: Ct Abdomen Pelvis Wo Contrast  Result Date: 01/12/2019 CLINICAL DATA:  85 y/o F; fall. Pain near the iliac crest/spine area. EXAM: CT ABDOMEN AND PELVIS WITHOUT CONTRAST CT LUMBAR SPINE WITHOUT CONTRAST TECHNIQUE: Multidetector CT imaging  of the abdomen and pelvis was performed following the standard protocol without IV contrast. Multidetector CT imaging of the lumbar spine was performed without intravenous contrast administration. Multiplanar CT image reconstructions were also generated. COMPARISON:  01/12/2019 lumbar spine radiographs. 06/20/2011 CT abdomen and pelvis. FINDINGS: CT LUMBAR SPINE FINDINGS Segmentation: Transitional S1 vertebral body with S1-2 disc. Alignment: Normal lumbar lordosis without listhesis. Mild lumbar levocurvature with apex at L5. Vertebrae: No loss of vertebral body height or suspicious bone lesion. Acute mildly displaced fracture of the right posterior twelfth rib (series 2, image 44). Acute nondisplaced fractures of the right L1 and L2 transverse processes (series 2, image 40 and 53). No additional fracture or dislocation identified. Paraspinal and other soft tissues: Negative. Disc levels: Lumbar spondylosis with discogenic  degenerative changes at L3-S1 with there is loss of intervertebral disc space height and vacuum phenomenon. Lumbar facet arthrosis with productive changes at L3-4, L4-5, and L5-S1. Disc and facet degenerative changes encroach on the neural foramen at the bilat L5-S1 level. No high-grade bony spinal canal stenosis. CT ABDOMEN AND PELVIS FINDINGS Lower chest: Fine reticular opacities at periphery of lung bases, likely seen on fibrosis. Minor atelectasis of the dependent lower lobes. Hepatobiliary: No focal liver abnormality is seen. Status post cholecystectomy. No biliary dilatation. Pancreas: Unremarkable. No pancreatic ductal dilatation or surrounding inflammatory changes. Spleen: Normal in size without focal abnormality. Adrenals/Urinary Tract: Adrenal glands are unremarkable. Multiple bilateral renal cysts measuring up to 3.6 cm in the left interpolar kidney are stable. Otherwise kidneys are normal, without renal calculi, focal lesion, or hydronephrosis. Left laterally directed small diverticulum of the bladder. Stomach/Bowel: Stomach is within normal limits. Appendix appears normal. No evidence of bowel wall thickening, distention, or inflammatory changes. Sigmoid diverticulosis without findings of acute diverticulitis. Vascular/Lymphatic: Aortic atherosclerosis. No enlarged abdominal or pelvic lymph nodes. Reproductive: Status post hysterectomy. No adnexal masses. Other: No abdominal wall hernia or abnormality. No abdominopelvic ascites. Musculoskeletal: No pelvic fracture or diastasis identified. Mixed lucent sclerotic changes of the femoral heads bilaterally are new from prior study and compatible with avascular necrosis. No subchondral fracture or femoral head collapse. IMPRESSION: 1. Acute mildly displaced fracture of the right posterior 12 rib. 2. Acute nondisplaced fractures of right L1 and L2 transverse processes. 3. No additional acute abnormality or fracture identified. 4. Stable chronic findings as  above. Electronically Signed   By: Mitzi Hansen M.D.   On: 01/12/2019 18:12   Dg Thoracic Spine 2 View  Result Date: 01/12/2019 CLINICAL DATA:  Pain after fall from car. EXAM: THORACIC SPINE 2 VIEWS COMPARISON:  Chest radiograph June 29, 2014 FINDINGS: Old minimal T7 compression fracture. Thoracic vertebral bodies otherwise intact and aligned with maintenance of thoracic kyphosis. Intervertebral disc heights preserved. No destructive bony lesions. Osteopenia. Prevertebral and paraspinal soft tissue planes are non-suspicious. Aortic calcifications. IMPRESSION: 1. No acute fracture deformity or malalignment. 2. Osteopenia which decreases sensitivity for acute nondisplaced fractures. 3.  Aortic Atherosclerosis (ICD10-I70.0). Electronically Signed   By: Awilda Metro M.D.   On: 01/12/2019 16:41   Dg Lumbar Spine Complete  Result Date: 01/12/2019 CLINICAL DATA:  85 y/o  F; status post fall with back pain. EXAM: LUMBAR SPINE - COMPLETE 4+ VIEW COMPARISON:  Concurrent thoracic spine radiographs. 05/31/2010 lumbar spine MRI. FINDINGS: There is no evidence of lumbar spine fracture. Mild dextrocurvature of lumbar spine with apex at L3. Normal lumbar lordosis without listhesis. Mild loss of intervertebral disc space height with vacuum phenomenon at the L2-L5 levels. Transitional S1 vertebral body.  Abdominal aortic calcific atherosclerosis. IMPRESSION: 1. No acute fracture identified. 2. Mild-to-moderate predominantly lower lumbar spondylosis. Electronically Signed   By: Mitzi Hansen M.D.   On: 01/12/2019 16:45   Ct Head Wo Contrast  Result Date: 01/12/2019 CLINICAL DATA:  Syncopal episode while getting out of shower today. EXAM: CT HEAD WITHOUT CONTRAST TECHNIQUE: Contiguous axial images were obtained from the base of the skull through the vertex without intravenous contrast. COMPARISON:  Head CT 06/20/2011 FINDINGS: Brain: Mild age related involutional changes of the brain with chronic  microvascular ischemic disease of periventricular and subcortical white matter. No acute intracranial hemorrhage, midline shift or edema. No intra-axial mass nor extra-axial fluid. No large vascular territory infarct. Midline fourth ventricle and basal cisterns without effacement. The brainstem and cerebellum are unremarkable. Idiopathic right basal ganglial calcification. Vascular: No hyperdense vessel or unexpected calcification. Atherosclerotic calcifications are noted of the left vertebral artery and both cavernous sinus carotids. Skull: Normal. Negative for fracture or focal lesion. Sinuses/Orbits: No acute finding.  Bilateral cataract extractions. Other: None IMPRESSION: 1. Age related involutional changes of brain with chronic mild small vessel ischemic disease. 2. No acute skull fracture nor acute intracranial abnormality. Electronically Signed   By: Tollie Eth M.D.   On: 01/12/2019 16:03   Ct Chest Wo Contrast  Result Date: 01/12/2019 CLINICAL DATA:  85 y/o  F; status post fall. Rib pain. EXAM: CT CHEST WITHOUT CONTRAST TECHNIQUE: Multidetector CT imaging of the chest was performed following the standard protocol without IV contrast. COMPARISON:  01/12/2019 chest radiograph FINDINGS: Cardiovascular: Mild cardiomegaly. No pericardial effusion. Mitral annular calcifications. Moderate coronary artery calcific atherosclerosis and aortic calcific atherosclerosis. Normal caliber thoracic aorta and main pulmonary artery. Mediastinum/Nodes: No enlarged mediastinal or axillary lymph nodes. Thyroid gland, trachea, and esophagus demonstrate no significant findings. Lungs/Pleura: Calcified pleural plaques in the lung apices. Smooth interlobular septal thickening. Fine reticulation with peripheral and basilar predominance probably representing mild senile fibrosis. No consolidation or pneumothorax. Upper Abdomen: No acute abnormality. Musculoskeletal: No fracture is seen. IMPRESSION: 1. No acute fracture or internal  injury identified. 2. Mild cardiomegaly and interstitial edema. 3. Aortic Atherosclerosis (ICD10-I70.0). Coronary artery calcific atherosclerosis. Electronically Signed   By: Mitzi Hansen M.D.   On: 01/12/2019 21:37   Ct Cervical Spine Wo Contrast  Result Date: 01/12/2019 CLINICAL DATA:  Fall getting out of car. EXAM: CT CERVICAL SPINE WITHOUT CONTRAST TECHNIQUE: Multidetector CT imaging of the cervical spine was performed without intravenous contrast. Multiplanar CT image reconstructions were also generated. COMPARISON:  CT HEAD January 12, 2019 FINDINGS: ALIGNMENT: Maintained lordosis. No malalignment.  Scoliosis. SKULL BASE AND VERTEBRAE: Cervical vertebral bodies and posterior elements are intact. Moderate C5-6 disc height loss and endplate spurring. Moderate facet arthropathy. No destructive bony lesions. C1-2 articulation maintained with mild arthropathy. Mild calcified pannus about the odontoid process seen with SOFT TISSUES AND SPINAL CANAL: Nonacute. Mild calcific atherosclerosis carotid bifurcations. DISC LEVELS: No high-grade osseous canal stenosis. Moderate to severe C5-6 neural foraminal narrowing. UPPER CHEST: Apical calcified pleural plaques. OTHER: None. IMPRESSION: 1. No acute fracture or malalignment. 2. Moderate to severe C5-6 neural foraminal narrowing. Electronically Signed   By: Awilda Metro M.D.   On: 01/12/2019 21:40   Ct L-spine No Charge  Result Date: 01/12/2019 CLINICAL DATA:  85 y/o F; fall. Pain near the iliac crest/spine area. EXAM: CT ABDOMEN AND PELVIS WITHOUT CONTRAST CT LUMBAR SPINE WITHOUT CONTRAST TECHNIQUE: Multidetector CT imaging of the abdomen and pelvis was performed following the standard protocol without IV  contrast. Multidetector CT imaging of the lumbar spine was performed without intravenous contrast administration. Multiplanar CT image reconstructions were also generated. COMPARISON:  01/12/2019 lumbar spine radiographs. 06/20/2011 CT abdomen and  pelvis. FINDINGS: CT LUMBAR SPINE FINDINGS Segmentation: Transitional S1 vertebral body with S1-2 disc. Alignment: Normal lumbar lordosis without listhesis. Mild lumbar levocurvature with apex at L5. Vertebrae: No loss of vertebral body height or suspicious bone lesion. Acute mildly displaced fracture of the right posterior twelfth rib (series 2, image 44). Acute nondisplaced fractures of the right L1 and L2 transverse processes (series 2, image 40 and 53). No additional fracture or dislocation identified. Paraspinal and other soft tissues: Negative. Disc levels: Lumbar spondylosis with discogenic degenerative changes at L3-S1 with there is loss of intervertebral disc space height and vacuum phenomenon. Lumbar facet arthrosis with productive changes at L3-4, L4-5, and L5-S1. Disc and facet degenerative changes encroach on the neural foramen at the bilat L5-S1 level. No high-grade bony spinal canal stenosis. CT ABDOMEN AND PELVIS FINDINGS Lower chest: Fine reticular opacities at periphery of lung bases, likely seen on fibrosis. Minor atelectasis of the dependent lower lobes. Hepatobiliary: No focal liver abnormality is seen. Status post cholecystectomy. No biliary dilatation. Pancreas: Unremarkable. No pancreatic ductal dilatation or surrounding inflammatory changes. Spleen: Normal in size without focal abnormality. Adrenals/Urinary Tract: Adrenal glands are unremarkable. Multiple bilateral renal cysts measuring up to 3.6 cm in the left interpolar kidney are stable. Otherwise kidneys are normal, without renal calculi, focal lesion, or hydronephrosis. Left laterally directed small diverticulum of the bladder. Stomach/Bowel: Stomach is within normal limits. Appendix appears normal. No evidence of bowel wall thickening, distention, or inflammatory changes. Sigmoid diverticulosis without findings of acute diverticulitis. Vascular/Lymphatic: Aortic atherosclerosis. No enlarged abdominal or pelvic lymph nodes.  Reproductive: Status post hysterectomy. No adnexal masses. Other: No abdominal wall hernia or abnormality. No abdominopelvic ascites. Musculoskeletal: No pelvic fracture or diastasis identified. Mixed lucent sclerotic changes of the femoral heads bilaterally are new from prior study and compatible with avascular necrosis. No subchondral fracture or femoral head collapse. IMPRESSION: 1. Acute mildly displaced fracture of the right posterior 12 rib. 2. Acute nondisplaced fractures of right L1 and L2 transverse processes. 3. No additional acute abnormality or fracture identified. 4. Stable chronic findings as above. Electronically Signed   By: Mitzi Hansen M.D.   On: 01/12/2019 18:12   Dg Hip Unilat W Or Wo Pelvis 2-3 Views Right  Result Date: 01/12/2019 CLINICAL DATA:  85 year old female with bilateral hip and flank pain after falling backward while getting out of a car earlier today EXAM: DG HIP (WITH OR WITHOUT PELVIS) 2-3V RIGHT COMPARISON:  Concurrently obtained radiographs of the right femur FINDINGS: The bones are diffusely demineralized. No definite acute fracture or malalignment is noted. Lower lumbar degenerative disc disease. The bowel gas pattern is not obstructed. No significant degenerative changes. IMPRESSION: 1. No acute fracture or malalignment. 2. The bones appear diffusely demineralized suggesting underlying osteopenia. This can limit the detection of nondisplaced fractures. If clinical concern persists for occult fracture (inability to weightbear, severe pain, etc.) Then MRI of the pelvis would be the next imaging test of choice as CT scan can also be limited by osteopenia/osteoporosis. Electronically Signed   By: Malachy Moan M.D.   On: 01/12/2019 16:44   Dg Femur Min 2 Views Right  Result Date: 01/12/2019 CLINICAL DATA:  85 year old female with bilateral hip, and flank pain after falling backward while getting into the car earlier today EXAM: RIGHT FEMUR 2 VIEWS COMPARISON:  Concurrently obtained radiographs of the pelvis and both hips FINDINGS: There is no evidence of fracture or other focal bone lesions. Soft tissues are unremarkable. The bones are diffusely demineralized. IMPRESSION: No acute fracture or malalignment is visualized. The bones appear diffusely demineralized. Electronically Signed   By: Malachy Moan M.D.   On: 01/12/2019 16:45     The results of significant diagnostics from this hospitalization (including imaging, microbiology, ancillary and laboratory) are listed below for reference.     Microbiology: No results found for this or any previous visit (from the past 240 hour(s)).   Labs: BNP (last 3 results) No results for input(s): BNP in the last 8760 hours. Basic Metabolic Panel: Recent Labs  Lab 01/12/19 1942 01/13/19 0533 01/14/19 0537 01/15/19 0454 01/16/19 0513  NA 139 137 136 138 137  K 3.9 4.0 4.6 4.5 3.9  CL 104 106 102 104 104  CO2 GLUCOSE 158* 110* 105* 103* 114*  BUN 29* 26* 26* 20 16  CREATININE 1.50* 1.40* 1.04* 1.00 0.87  CALCIUM 8.9 8.6* 9.2 9.0 9.0  MG  --   --  2.2 2.0 2.0   Liver Function Tests: No results for input(s): AST, ALT, ALKPHOS, BILITOT, PROT, ALBUMIN in the last 168 hours. No results for input(s): LIPASE, AMYLASE in the last 168 hours. No results for input(s): AMMONIA in the last 168 hours. CBC: Recent Labs  Lab 01/12/19 1942 01/13/19 0533 01/14/19 0537 01/15/19 0454 01/16/19 0513  WBC 19.3* 9.7 9.6 8.0 11.1*  NEUTROABS 18.2*  --   --   --   --   HGB 11.7* 11.7* 12.4 11.4* 11.7*  HCT 38.4 39.4 42.8 38.8 39.1  MCV 97.7 99.2 101.4* 99.5 97.8  PLT 257 247 252 264 290   Cardiac Enzymes: Recent Labs  Lab 01/12/19 2232 01/13/19 0533  CKTOTAL 37* 33*   BNP: Invalid input(s): POCBNP CBG: No results for input(s): GLUCAP in the last 168 hours. D-Dimer No results for input(s): DDIMER in the last 72 hours. Hgb A1c No results for input(s): HGBA1C in the last 72  hours. Lipid Profile No results for input(s): CHOL, HDL, LDLCALC, TRIG, CHOLHDL, LDLDIRECT in the last 72 hours. Thyroid function studies No results for input(s): TSH, T4TOTAL, T3FREE, THYROIDAB in the last 72 hours.  Invalid input(s): FREET3 Anemia work up No results for input(s): VITAMINB12, FOLATE, FERRITIN, TIBC, IRON, RETICCTPCT in the last 72 hours. Urinalysis    Component Value Date/Time   COLORURINE YELLOW 06/22/2011 1224   APPEARANCEUR CLOUDY (A) 06/22/2011 1224   LABSPEC 1.015 06/22/2011 1224   PHURINE 5.5 06/22/2011 1224   GLUCOSEU NEGATIVE 06/22/2011 1224   HGBUR MODERATE (A) 06/22/2011 1224   BILIRUBINUR NEGATIVE 06/22/2011 1224   KETONESUR NEGATIVE 06/22/2011 1224   PROTEINUR NEGATIVE 06/22/2011 1224   UROBILINOGEN 1.0 06/22/2011 1224   NITRITE POSITIVE (A) 06/22/2011 1224   LEUKOCYTESUR SMALL (A) 06/22/2011 1224   Sepsis Labs Invalid input(s): PROCALCITONIN,  WBC,  LACTICIDVEN Microbiology No results found for this or any previous visit (from the past 240 hour(s)).   Time coordinating discharge:  I have spent 35 minutes face to face with the patient and on the ward discussing the patients care, assessment, plan and disposition with other care givers. >50% of the time was devoted counseling the patient about the risks and benefits of treatment/Discharge disposition and coordinating care.   SIGNED:   Dimple Nanas, MD  [facility] 01/16/2019, 9:58 AM   If 7PM-7AM, please  contact night-coverage [email or web address]

## 2019-01-16 NOTE — Progress Notes (Signed)
ANTICOAGULATION CONSULT NOTE   Pharmacy Consult for warfarin Indication: hx atrial fibrillation  Allergies  Allergen Reactions  . Sulfa Antibiotics Rash    Patient Measurements: Height: 5' (152.4 cm)(as per pt.) Weight: 155 lb 12.8 oz (70.7 kg) IBW/kg (Calculated) : 45.5  Vital Signs: Temp: 98.3 F (36.8 C) (03/08 0522) Temp Source: Oral (03/08 0522) BP: 129/73 (03/08 0522) Pulse Rate: 102 (03/08 0522)  Labs: Recent Labs    01/14/19 0537 01/15/19 0454 01/16/19 0513  HGB 12.4 11.4* 11.7*  HCT 42.8 38.8 39.1  PLT 252 264 290  LABPROT 21.5* 21.6* 19.5*  INR 1.9* 1.9* 1.7*  CREATININE 1.04* 1.00 0.87    Estimated Creatinine Clearance: 37.7 mL/min (by C-G formula based on SCr of 0.87 mg/dL).   Medications:  PTA warfarin regimen:  3 mg daily except 2 mg on TTSat (last dose taken on 3/3)  Assessment: Patient is a 85 y.o F with hx afib on warfarin PTA, presented to the ED on 3/4 s/p fall.  Head CT with no acute findings.  Spine CT showed "acute mildly displaced fracture of the right posterior 12 rib" and "acute nondisplaced fractures of right L1 and L2 transverse processes".  To resume warfarin inpatient.     Baseline INR slightly subtherapeutic  HD 2 mg Tu/Th/Sat and 3 mg M/W/F/Su. LD 3/3 1800  Significant events:  Today, 01/16/2019:  INR 1.7 subtherapeutic  Hgb stable, Plts WNL  Major drug interactions: none  No bleeding issues per nursing  Heart diet ordered, poor intake charted  Wednesday's warfarin dose given early Thursday AM; additional 1 mg was given on 3/5 at 1800  Goal of Therapy: INR 2-3  Plan:  Warfarin 3 mg PO tonight at 18:00  Daily INR  CBC at least q72 hr while on warfarin  Monitor for signs of bleeding or thrombosis   Arley Phenix RPh 01/16/2019, 8:06 AM Pager [phone]

## 2019-01-17 DIAGNOSIS — S2231XD Fracture of one rib, right side, subsequent encounter for fracture with routine healing: Secondary | ICD-10-CM | POA: Diagnosis not present

## 2019-01-17 DIAGNOSIS — F0390 Unspecified dementia without behavioral disturbance: Secondary | ICD-10-CM | POA: Diagnosis not present

## 2019-01-17 DIAGNOSIS — I480 Paroxysmal atrial fibrillation: Secondary | ICD-10-CM | POA: Diagnosis not present

## 2019-01-17 DIAGNOSIS — S32018D Other fracture of first lumbar vertebra, subsequent encounter for fracture with routine healing: Secondary | ICD-10-CM | POA: Diagnosis not present

## 2019-01-19 DIAGNOSIS — I4891 Unspecified atrial fibrillation: Secondary | ICD-10-CM | POA: Diagnosis not present

## 2019-01-19 DIAGNOSIS — I10 Essential (primary) hypertension: Secondary | ICD-10-CM | POA: Diagnosis not present

## 2019-01-19 DIAGNOSIS — M810 Age-related osteoporosis without current pathological fracture: Secondary | ICD-10-CM | POA: Diagnosis not present

## 2019-01-19 DIAGNOSIS — S32009A Unspecified fracture of unspecified lumbar vertebra, initial encounter for closed fracture: Secondary | ICD-10-CM | POA: Diagnosis not present

## 2019-01-19 DIAGNOSIS — E785 Hyperlipidemia, unspecified: Secondary | ICD-10-CM | POA: Diagnosis not present

## 2019-01-19 DIAGNOSIS — S22009A Unspecified fracture of unspecified thoracic vertebra, initial encounter for closed fracture: Secondary | ICD-10-CM | POA: Diagnosis not present

## 2019-01-24 DIAGNOSIS — I480 Paroxysmal atrial fibrillation: Secondary | ICD-10-CM | POA: Diagnosis not present

## 2019-01-24 DIAGNOSIS — S32018D Other fracture of first lumbar vertebra, subsequent encounter for fracture with routine healing: Secondary | ICD-10-CM | POA: Diagnosis not present

## 2019-01-24 DIAGNOSIS — S2231XD Fracture of one rib, right side, subsequent encounter for fracture with routine healing: Secondary | ICD-10-CM | POA: Diagnosis not present

## 2019-01-24 DIAGNOSIS — I10 Essential (primary) hypertension: Secondary | ICD-10-CM | POA: Diagnosis not present

## 2019-01-26 ENCOUNTER — Other Ambulatory Visit: Payer: Self-pay | Admitting: *Deleted

## 2019-01-26 NOTE — Patient Outreach (Signed)
[facility]) Care Management  01/26/2019  Ziggy Berl 11/13/1925 [id number]   Collaboration with [facility] after telephonic IDT meeting with [facility]. Patient will discharge home with daughter and caregivers.  Patient has history of dementia and HTN.  No [facility] care management needs identified at this time.   Plan to sign off. Alben Spittle. Albertha Ghee, MSN, AGNP-C, CCM  Post Acute Care Coordinator [facility] [employer] [phone]) Business Cell  [phone]) Toll Free Office

## 2019-01-29 DIAGNOSIS — W19XXXD Unspecified fall, subsequent encounter: Secondary | ICD-10-CM | POA: Diagnosis not present

## 2019-01-29 DIAGNOSIS — F0390 Unspecified dementia without behavioral disturbance: Secondary | ICD-10-CM | POA: Diagnosis not present

## 2019-01-29 DIAGNOSIS — I480 Paroxysmal atrial fibrillation: Secondary | ICD-10-CM | POA: Diagnosis not present

## 2019-01-29 DIAGNOSIS — S32018D Other fracture of first lumbar vertebra, subsequent encounter for fracture with routine healing: Secondary | ICD-10-CM | POA: Diagnosis not present

## 2019-01-29 DIAGNOSIS — I10 Essential (primary) hypertension: Secondary | ICD-10-CM | POA: Diagnosis not present

## 2019-01-29 DIAGNOSIS — F329 Major depressive disorder, single episode, unspecified: Secondary | ICD-10-CM | POA: Diagnosis not present

## 2019-01-29 DIAGNOSIS — S2231XD Fracture of one rib, right side, subsequent encounter for fracture with routine healing: Secondary | ICD-10-CM | POA: Diagnosis not present

## 2019-01-31 DIAGNOSIS — I480 Paroxysmal atrial fibrillation: Secondary | ICD-10-CM | POA: Diagnosis not present

## 2019-01-31 DIAGNOSIS — S2231XD Fracture of one rib, right side, subsequent encounter for fracture with routine healing: Secondary | ICD-10-CM | POA: Diagnosis not present

## 2019-01-31 DIAGNOSIS — I10 Essential (primary) hypertension: Secondary | ICD-10-CM | POA: Diagnosis not present

## 2019-01-31 DIAGNOSIS — S32018D Other fracture of first lumbar vertebra, subsequent encounter for fracture with routine healing: Secondary | ICD-10-CM | POA: Diagnosis not present

## 2019-02-02 DIAGNOSIS — R42 Dizziness and giddiness: Secondary | ICD-10-CM | POA: Diagnosis not present

## 2019-02-02 DIAGNOSIS — I480 Paroxysmal atrial fibrillation: Secondary | ICD-10-CM | POA: Diagnosis not present

## 2019-02-02 DIAGNOSIS — S32018D Other fracture of first lumbar vertebra, subsequent encounter for fracture with routine healing: Secondary | ICD-10-CM | POA: Diagnosis not present

## 2019-02-02 DIAGNOSIS — S2231XD Fracture of one rib, right side, subsequent encounter for fracture with routine healing: Secondary | ICD-10-CM | POA: Diagnosis not present

## 2019-02-03 DIAGNOSIS — S32018D Other fracture of first lumbar vertebra, subsequent encounter for fracture with routine healing: Secondary | ICD-10-CM | POA: Diagnosis not present

## 2019-02-03 DIAGNOSIS — I480 Paroxysmal atrial fibrillation: Secondary | ICD-10-CM | POA: Diagnosis not present

## 2019-02-03 DIAGNOSIS — R42 Dizziness and giddiness: Secondary | ICD-10-CM | POA: Diagnosis not present

## 2019-02-03 DIAGNOSIS — S2231XD Fracture of one rib, right side, subsequent encounter for fracture with routine healing: Secondary | ICD-10-CM | POA: Diagnosis not present

## 2019-02-07 ENCOUNTER — Other Ambulatory Visit: Payer: Self-pay | Admitting: *Deleted

## 2019-02-07 NOTE — Patient Outreach (Signed)
[facility]) Care Management  02/07/2019  Kendra Smith [birth year] [id number]   Subjective: Telephone call to patient's home number, spoke with female answering the phone, states she is Adela Ports daughter [name]), states she is Ms. Dickerson's power of attorney, handles all of her affairs, daughter aware power of attorney not on file with [facility], advised unable to discuss nature of call without Ms. Bolon's consent, daughter voices understanding, she is currently in the car, mother not with her, can call back at same number tomorrow, and speak with Ms. Oestreicher.       Objective: Per KPN (Knowledge Performance Now, point of care tool) and chart review, patient hospitalized 01/12/2019 - 01/16/2019 for status post fall.   Patient inpatient  01/16/2019 - unknown discharge date at [facility].   Patient also has a history of hypertension, hyperlipidemia, dementia, rib fracture (12th), and Fracture of transverse process of lumbar vertebra L1-L2.       Assessment: Received Medicare EMMI General Discharge Red Flag Alert follow up referral on 02/07/2019.   Red Flag Alert Triggers times 2, Day #1, patient answered I Don't Know to the following question: Got discharge papers?   Patient answered Doesn't Apply to the following question: Scheduled follow-up?   [facility] EMMI follow up pending patient contact.      Plan: RNCM will send unsuccessful outreach letter, [facility] pamphlet, handout: Know Before You Go, will call patient for 2nd telephone outreach attempt within 4 business days, [facility] EMMI follow up, and proceed with case closure, within 10 business days if no return call.       Clydene Burack H. Gardiner Barefoot, BSN, CCM [facility] Care Management [facility] Telephonic CM Phone: [phone] Fax: [phone]

## 2019-02-08 ENCOUNTER — Other Ambulatory Visit: Payer: Self-pay | Admitting: *Deleted

## 2019-02-08 DIAGNOSIS — S32009D Unspecified fracture of unspecified lumbar vertebra, subsequent encounter for fracture with routine healing: Secondary | ICD-10-CM | POA: Diagnosis not present

## 2019-02-08 DIAGNOSIS — Z7901 Long term (current) use of anticoagulants: Secondary | ICD-10-CM | POA: Diagnosis not present

## 2019-02-08 DIAGNOSIS — F329 Major depressive disorder, single episode, unspecified: Secondary | ICD-10-CM | POA: Diagnosis not present

## 2019-02-08 DIAGNOSIS — Z9181 History of falling: Secondary | ICD-10-CM | POA: Diagnosis not present

## 2019-02-08 DIAGNOSIS — F0390 Unspecified dementia without behavioral disturbance: Secondary | ICD-10-CM | POA: Diagnosis not present

## 2019-02-08 DIAGNOSIS — I4891 Unspecified atrial fibrillation: Secondary | ICD-10-CM | POA: Diagnosis not present

## 2019-02-08 DIAGNOSIS — S2239XD Fracture of one rib, unspecified side, subsequent encounter for fracture with routine healing: Secondary | ICD-10-CM | POA: Diagnosis not present

## 2019-02-08 DIAGNOSIS — I10 Essential (primary) hypertension: Secondary | ICD-10-CM | POA: Diagnosis not present

## 2019-02-08 NOTE — Patient Outreach (Signed)
[facility]) Care Management  02/08/2019  Kendra Smith 1925/11/14 [id number]   Subjective:  Telephone call to patient's home number, spoke with patient, and HIPAA verified.  Discussed [facility] Care Management Medicare EMMI General Discharge Red Flag Alert follow up, patient voiced understanding, and is in agreement to follow up.  Patient gave verbal consent for RNCM to speak with daughter Kendra Smith regarding healthcare needs as needed.  Spoke with patient's daughter Kendra Smith), discussed [facility] Care Management Medicare EMMI General Discharge Red Flag Alert follow up, daughter voiced understanding, and is in agreement to follow up on patient's behalf.  Daughter states patient is doing okay, home health nurse currently in the home assessing patient, states patient is receiving home health services via [facility].   States patient was discharge from [facility] on 02/04/2019.  Patient is receiving nursing, physical therapy, occupational therapy, speech therapy, and aide services.  Daughter states patient has an appointment with primary MD's office (Dr. Felipa Eth on 02/09/2019 for INR check and no in person office visit needed at this time per MD's office.  Daughter planning to talking with home health agency and MD's office regarding assisting with assisted living placement for patient in the near future. She will also follow up with them regarding her questions related to FL2.  Daughter states she remembers patient receiving EMMI automated calls, patient has discharge papers and has follow up appointment as needed.  Daughter states patient does not have any education material, EMMI follow up, care coordination, care management, disease monitoring, transportation, community resource, or pharmacy needs at this time.   States she is very appreciative of the follow up.     Objective: Per KPN (Knowledge Performance Now, point of care tool) and chart review, patient hospitalized 01/12/2019 - 01/16/2019  for status post fall.   Patient inpatient  01/16/2019 - unknown discharge date at [facility].   Patient also has a history of hypertension, hyperlipidemia, dementia, rib fracture (12th), and Fracture of transverse process of lumbar vertebra L1-L2.       Assessment: Received Medicare EMMI General Discharge Red Flag Alert follow up referral on 02/07/2019. Red Flag Alert Triggers times 2, Day #1, patient answered I Don't Know to the following question: Got discharge papers?   Patient answered Doesn't Apply to the following question: Scheduled follow-up?   [facility] EMMI follow up completed and no further care management needs.       Plan: RNCM will complete case closure due to follow up completed / no care management needs.      Celvin Taney H. Gardiner Barefoot, BSN, CCM [facility] Care Management [facility] Telephonic CM Phone: [phone] Fax: [phone]

## 2019-02-09 DIAGNOSIS — I4891 Unspecified atrial fibrillation: Secondary | ICD-10-CM | POA: Diagnosis not present

## 2019-02-09 DIAGNOSIS — S32009D Unspecified fracture of unspecified lumbar vertebra, subsequent encounter for fracture with routine healing: Secondary | ICD-10-CM | POA: Diagnosis not present

## 2019-02-09 DIAGNOSIS — S2239XD Fracture of one rib, unspecified side, subsequent encounter for fracture with routine healing: Secondary | ICD-10-CM | POA: Diagnosis not present

## 2019-02-09 DIAGNOSIS — F0390 Unspecified dementia without behavioral disturbance: Secondary | ICD-10-CM | POA: Diagnosis not present

## 2019-02-09 DIAGNOSIS — I480 Paroxysmal atrial fibrillation: Secondary | ICD-10-CM | POA: Diagnosis not present

## 2019-02-09 DIAGNOSIS — F329 Major depressive disorder, single episode, unspecified: Secondary | ICD-10-CM | POA: Diagnosis not present

## 2019-02-09 DIAGNOSIS — I10 Essential (primary) hypertension: Secondary | ICD-10-CM | POA: Diagnosis not present

## 2019-02-09 DIAGNOSIS — Z7901 Long term (current) use of anticoagulants: Secondary | ICD-10-CM | POA: Diagnosis not present

## 2019-02-11 DIAGNOSIS — F418 Other specified anxiety disorders: Secondary | ICD-10-CM | POA: Diagnosis not present

## 2019-02-11 DIAGNOSIS — R7301 Impaired fasting glucose: Secondary | ICD-10-CM | POA: Diagnosis not present

## 2019-02-11 DIAGNOSIS — I10 Essential (primary) hypertension: Secondary | ICD-10-CM | POA: Diagnosis not present

## 2019-02-11 DIAGNOSIS — F0390 Unspecified dementia without behavioral disturbance: Secondary | ICD-10-CM | POA: Diagnosis not present

## 2019-02-11 DIAGNOSIS — I4891 Unspecified atrial fibrillation: Secondary | ICD-10-CM | POA: Diagnosis not present

## 2019-02-11 DIAGNOSIS — S32009D Unspecified fracture of unspecified lumbar vertebra, subsequent encounter for fracture with routine healing: Secondary | ICD-10-CM | POA: Diagnosis not present

## 2019-02-11 DIAGNOSIS — I480 Paroxysmal atrial fibrillation: Secondary | ICD-10-CM | POA: Diagnosis not present

## 2019-02-11 DIAGNOSIS — F329 Major depressive disorder, single episode, unspecified: Secondary | ICD-10-CM | POA: Diagnosis not present

## 2019-02-11 DIAGNOSIS — S32009S Unspecified fracture of unspecified lumbar vertebra, sequela: Secondary | ICD-10-CM | POA: Diagnosis not present

## 2019-02-11 DIAGNOSIS — S2239XD Fracture of one rib, unspecified side, subsequent encounter for fracture with routine healing: Secondary | ICD-10-CM | POA: Diagnosis not present

## 2019-02-11 DIAGNOSIS — R2689 Other abnormalities of gait and mobility: Secondary | ICD-10-CM | POA: Diagnosis not present

## 2019-02-14 DIAGNOSIS — F329 Major depressive disorder, single episode, unspecified: Secondary | ICD-10-CM | POA: Diagnosis not present

## 2019-02-14 DIAGNOSIS — S2239XD Fracture of one rib, unspecified side, subsequent encounter for fracture with routine healing: Secondary | ICD-10-CM | POA: Diagnosis not present

## 2019-02-14 DIAGNOSIS — I4891 Unspecified atrial fibrillation: Secondary | ICD-10-CM | POA: Diagnosis not present

## 2019-02-14 DIAGNOSIS — I10 Essential (primary) hypertension: Secondary | ICD-10-CM | POA: Diagnosis not present

## 2019-02-14 DIAGNOSIS — F0390 Unspecified dementia without behavioral disturbance: Secondary | ICD-10-CM | POA: Diagnosis not present

## 2019-02-14 DIAGNOSIS — S32009D Unspecified fracture of unspecified lumbar vertebra, subsequent encounter for fracture with routine healing: Secondary | ICD-10-CM | POA: Diagnosis not present

## 2019-02-15 DIAGNOSIS — I4891 Unspecified atrial fibrillation: Secondary | ICD-10-CM | POA: Diagnosis not present

## 2019-02-15 DIAGNOSIS — F329 Major depressive disorder, single episode, unspecified: Secondary | ICD-10-CM | POA: Diagnosis not present

## 2019-02-15 DIAGNOSIS — S32009D Unspecified fracture of unspecified lumbar vertebra, subsequent encounter for fracture with routine healing: Secondary | ICD-10-CM | POA: Diagnosis not present

## 2019-02-15 DIAGNOSIS — S2239XD Fracture of one rib, unspecified side, subsequent encounter for fracture with routine healing: Secondary | ICD-10-CM | POA: Diagnosis not present

## 2019-02-15 DIAGNOSIS — I10 Essential (primary) hypertension: Secondary | ICD-10-CM | POA: Diagnosis not present

## 2019-02-15 DIAGNOSIS — F0390 Unspecified dementia without behavioral disturbance: Secondary | ICD-10-CM | POA: Diagnosis not present

## 2019-02-16 DIAGNOSIS — I10 Essential (primary) hypertension: Secondary | ICD-10-CM | POA: Diagnosis not present

## 2019-02-16 DIAGNOSIS — S32009D Unspecified fracture of unspecified lumbar vertebra, subsequent encounter for fracture with routine healing: Secondary | ICD-10-CM | POA: Diagnosis not present

## 2019-02-16 DIAGNOSIS — S2239XD Fracture of one rib, unspecified side, subsequent encounter for fracture with routine healing: Secondary | ICD-10-CM | POA: Diagnosis not present

## 2019-02-16 DIAGNOSIS — I4891 Unspecified atrial fibrillation: Secondary | ICD-10-CM | POA: Diagnosis not present

## 2019-02-16 DIAGNOSIS — F0390 Unspecified dementia without behavioral disturbance: Secondary | ICD-10-CM | POA: Diagnosis not present

## 2019-02-16 DIAGNOSIS — F329 Major depressive disorder, single episode, unspecified: Secondary | ICD-10-CM | POA: Diagnosis not present

## 2019-02-17 DIAGNOSIS — F0390 Unspecified dementia without behavioral disturbance: Secondary | ICD-10-CM | POA: Diagnosis not present

## 2019-02-17 DIAGNOSIS — S2239XD Fracture of one rib, unspecified side, subsequent encounter for fracture with routine healing: Secondary | ICD-10-CM | POA: Diagnosis not present

## 2019-02-17 DIAGNOSIS — I10 Essential (primary) hypertension: Secondary | ICD-10-CM | POA: Diagnosis not present

## 2019-02-17 DIAGNOSIS — I4891 Unspecified atrial fibrillation: Secondary | ICD-10-CM | POA: Diagnosis not present

## 2019-02-17 DIAGNOSIS — S32009D Unspecified fracture of unspecified lumbar vertebra, subsequent encounter for fracture with routine healing: Secondary | ICD-10-CM | POA: Diagnosis not present

## 2019-02-17 DIAGNOSIS — F329 Major depressive disorder, single episode, unspecified: Secondary | ICD-10-CM | POA: Diagnosis not present

## 2019-02-21 DIAGNOSIS — F0390 Unspecified dementia without behavioral disturbance: Secondary | ICD-10-CM | POA: Diagnosis not present

## 2019-02-21 DIAGNOSIS — S2239XD Fracture of one rib, unspecified side, subsequent encounter for fracture with routine healing: Secondary | ICD-10-CM | POA: Diagnosis not present

## 2019-02-21 DIAGNOSIS — F329 Major depressive disorder, single episode, unspecified: Secondary | ICD-10-CM | POA: Diagnosis not present

## 2019-02-21 DIAGNOSIS — I4891 Unspecified atrial fibrillation: Secondary | ICD-10-CM | POA: Diagnosis not present

## 2019-02-21 DIAGNOSIS — S32009D Unspecified fracture of unspecified lumbar vertebra, subsequent encounter for fracture with routine healing: Secondary | ICD-10-CM | POA: Diagnosis not present

## 2019-02-21 DIAGNOSIS — I10 Essential (primary) hypertension: Secondary | ICD-10-CM | POA: Diagnosis not present

## 2019-02-22 DIAGNOSIS — S32009D Unspecified fracture of unspecified lumbar vertebra, subsequent encounter for fracture with routine healing: Secondary | ICD-10-CM | POA: Diagnosis not present

## 2019-02-22 DIAGNOSIS — I10 Essential (primary) hypertension: Secondary | ICD-10-CM | POA: Diagnosis not present

## 2019-02-22 DIAGNOSIS — S2239XD Fracture of one rib, unspecified side, subsequent encounter for fracture with routine healing: Secondary | ICD-10-CM | POA: Diagnosis not present

## 2019-02-22 DIAGNOSIS — I4891 Unspecified atrial fibrillation: Secondary | ICD-10-CM | POA: Diagnosis not present

## 2019-02-22 DIAGNOSIS — F329 Major depressive disorder, single episode, unspecified: Secondary | ICD-10-CM | POA: Diagnosis not present

## 2019-02-22 DIAGNOSIS — F0390 Unspecified dementia without behavioral disturbance: Secondary | ICD-10-CM | POA: Diagnosis not present

## 2019-02-23 DIAGNOSIS — S32009D Unspecified fracture of unspecified lumbar vertebra, subsequent encounter for fracture with routine healing: Secondary | ICD-10-CM | POA: Diagnosis not present

## 2019-02-23 DIAGNOSIS — I4891 Unspecified atrial fibrillation: Secondary | ICD-10-CM | POA: Diagnosis not present

## 2019-02-23 DIAGNOSIS — F329 Major depressive disorder, single episode, unspecified: Secondary | ICD-10-CM | POA: Diagnosis not present

## 2019-02-23 DIAGNOSIS — S2239XD Fracture of one rib, unspecified side, subsequent encounter for fracture with routine healing: Secondary | ICD-10-CM | POA: Diagnosis not present

## 2019-02-23 DIAGNOSIS — I10 Essential (primary) hypertension: Secondary | ICD-10-CM | POA: Diagnosis not present

## 2019-02-23 DIAGNOSIS — F0390 Unspecified dementia without behavioral disturbance: Secondary | ICD-10-CM | POA: Diagnosis not present

## 2019-02-24 DIAGNOSIS — I10 Essential (primary) hypertension: Secondary | ICD-10-CM | POA: Diagnosis not present

## 2019-02-24 DIAGNOSIS — S2239XD Fracture of one rib, unspecified side, subsequent encounter for fracture with routine healing: Secondary | ICD-10-CM | POA: Diagnosis not present

## 2019-02-24 DIAGNOSIS — F329 Major depressive disorder, single episode, unspecified: Secondary | ICD-10-CM | POA: Diagnosis not present

## 2019-02-24 DIAGNOSIS — F0390 Unspecified dementia without behavioral disturbance: Secondary | ICD-10-CM | POA: Diagnosis not present

## 2019-02-24 DIAGNOSIS — S32009D Unspecified fracture of unspecified lumbar vertebra, subsequent encounter for fracture with routine healing: Secondary | ICD-10-CM | POA: Diagnosis not present

## 2019-02-24 DIAGNOSIS — I4891 Unspecified atrial fibrillation: Secondary | ICD-10-CM | POA: Diagnosis not present

## 2019-02-25 DIAGNOSIS — I480 Paroxysmal atrial fibrillation: Secondary | ICD-10-CM | POA: Diagnosis not present

## 2019-02-25 DIAGNOSIS — R2689 Other abnormalities of gait and mobility: Secondary | ICD-10-CM | POA: Diagnosis not present

## 2019-02-25 DIAGNOSIS — F0390 Unspecified dementia without behavioral disturbance: Secondary | ICD-10-CM | POA: Diagnosis not present

## 2019-02-28 DIAGNOSIS — S32009D Unspecified fracture of unspecified lumbar vertebra, subsequent encounter for fracture with routine healing: Secondary | ICD-10-CM | POA: Diagnosis not present

## 2019-02-28 DIAGNOSIS — I4891 Unspecified atrial fibrillation: Secondary | ICD-10-CM | POA: Diagnosis not present

## 2019-02-28 DIAGNOSIS — S2239XD Fracture of one rib, unspecified side, subsequent encounter for fracture with routine healing: Secondary | ICD-10-CM | POA: Diagnosis not present

## 2019-02-28 DIAGNOSIS — F329 Major depressive disorder, single episode, unspecified: Secondary | ICD-10-CM | POA: Diagnosis not present

## 2019-02-28 DIAGNOSIS — I10 Essential (primary) hypertension: Secondary | ICD-10-CM | POA: Diagnosis not present

## 2019-02-28 DIAGNOSIS — F0390 Unspecified dementia without behavioral disturbance: Secondary | ICD-10-CM | POA: Diagnosis not present

## 2019-03-01 DIAGNOSIS — F0390 Unspecified dementia without behavioral disturbance: Secondary | ICD-10-CM | POA: Diagnosis not present

## 2019-03-01 DIAGNOSIS — S2239XD Fracture of one rib, unspecified side, subsequent encounter for fracture with routine healing: Secondary | ICD-10-CM | POA: Diagnosis not present

## 2019-03-01 DIAGNOSIS — I4891 Unspecified atrial fibrillation: Secondary | ICD-10-CM | POA: Diagnosis not present

## 2019-03-01 DIAGNOSIS — F329 Major depressive disorder, single episode, unspecified: Secondary | ICD-10-CM | POA: Diagnosis not present

## 2019-03-01 DIAGNOSIS — I10 Essential (primary) hypertension: Secondary | ICD-10-CM | POA: Diagnosis not present

## 2019-03-01 DIAGNOSIS — S32009D Unspecified fracture of unspecified lumbar vertebra, subsequent encounter for fracture with routine healing: Secondary | ICD-10-CM | POA: Diagnosis not present

## 2019-03-02 DIAGNOSIS — F0390 Unspecified dementia without behavioral disturbance: Secondary | ICD-10-CM | POA: Diagnosis not present

## 2019-03-02 DIAGNOSIS — I4891 Unspecified atrial fibrillation: Secondary | ICD-10-CM | POA: Diagnosis not present

## 2019-03-02 DIAGNOSIS — S32009D Unspecified fracture of unspecified lumbar vertebra, subsequent encounter for fracture with routine healing: Secondary | ICD-10-CM | POA: Diagnosis not present

## 2019-03-02 DIAGNOSIS — S2239XD Fracture of one rib, unspecified side, subsequent encounter for fracture with routine healing: Secondary | ICD-10-CM | POA: Diagnosis not present

## 2019-03-02 DIAGNOSIS — F329 Major depressive disorder, single episode, unspecified: Secondary | ICD-10-CM | POA: Diagnosis not present

## 2019-03-02 DIAGNOSIS — I10 Essential (primary) hypertension: Secondary | ICD-10-CM | POA: Diagnosis not present

## 2019-03-03 DIAGNOSIS — S32009D Unspecified fracture of unspecified lumbar vertebra, subsequent encounter for fracture with routine healing: Secondary | ICD-10-CM | POA: Diagnosis not present

## 2019-03-03 DIAGNOSIS — S2239XD Fracture of one rib, unspecified side, subsequent encounter for fracture with routine healing: Secondary | ICD-10-CM | POA: Diagnosis not present

## 2019-03-03 DIAGNOSIS — I10 Essential (primary) hypertension: Secondary | ICD-10-CM | POA: Diagnosis not present

## 2019-03-03 DIAGNOSIS — I4891 Unspecified atrial fibrillation: Secondary | ICD-10-CM | POA: Diagnosis not present

## 2019-03-03 DIAGNOSIS — F329 Major depressive disorder, single episode, unspecified: Secondary | ICD-10-CM | POA: Diagnosis not present

## 2019-03-03 DIAGNOSIS — F0390 Unspecified dementia without behavioral disturbance: Secondary | ICD-10-CM | POA: Diagnosis not present

## 2019-03-07 DIAGNOSIS — S2239XD Fracture of one rib, unspecified side, subsequent encounter for fracture with routine healing: Secondary | ICD-10-CM | POA: Diagnosis not present

## 2019-03-07 DIAGNOSIS — F329 Major depressive disorder, single episode, unspecified: Secondary | ICD-10-CM | POA: Diagnosis not present

## 2019-03-07 DIAGNOSIS — F0390 Unspecified dementia without behavioral disturbance: Secondary | ICD-10-CM | POA: Diagnosis not present

## 2019-03-07 DIAGNOSIS — I10 Essential (primary) hypertension: Secondary | ICD-10-CM | POA: Diagnosis not present

## 2019-03-07 DIAGNOSIS — I4891 Unspecified atrial fibrillation: Secondary | ICD-10-CM | POA: Diagnosis not present

## 2019-03-07 DIAGNOSIS — S32009D Unspecified fracture of unspecified lumbar vertebra, subsequent encounter for fracture with routine healing: Secondary | ICD-10-CM | POA: Diagnosis not present

## 2019-03-08 DIAGNOSIS — I10 Essential (primary) hypertension: Secondary | ICD-10-CM | POA: Diagnosis not present

## 2019-03-08 DIAGNOSIS — I4891 Unspecified atrial fibrillation: Secondary | ICD-10-CM | POA: Diagnosis not present

## 2019-03-08 DIAGNOSIS — S32009D Unspecified fracture of unspecified lumbar vertebra, subsequent encounter for fracture with routine healing: Secondary | ICD-10-CM | POA: Diagnosis not present

## 2019-03-08 DIAGNOSIS — S2239XD Fracture of one rib, unspecified side, subsequent encounter for fracture with routine healing: Secondary | ICD-10-CM | POA: Diagnosis not present

## 2019-03-08 DIAGNOSIS — F0390 Unspecified dementia without behavioral disturbance: Secondary | ICD-10-CM | POA: Diagnosis not present

## 2019-03-08 DIAGNOSIS — F329 Major depressive disorder, single episode, unspecified: Secondary | ICD-10-CM | POA: Diagnosis not present

## 2019-03-09 DIAGNOSIS — S32009D Unspecified fracture of unspecified lumbar vertebra, subsequent encounter for fracture with routine healing: Secondary | ICD-10-CM | POA: Diagnosis not present

## 2019-03-09 DIAGNOSIS — F0390 Unspecified dementia without behavioral disturbance: Secondary | ICD-10-CM | POA: Diagnosis not present

## 2019-03-09 DIAGNOSIS — F329 Major depressive disorder, single episode, unspecified: Secondary | ICD-10-CM | POA: Diagnosis not present

## 2019-03-09 DIAGNOSIS — I4891 Unspecified atrial fibrillation: Secondary | ICD-10-CM | POA: Diagnosis not present

## 2019-03-09 DIAGNOSIS — S2239XD Fracture of one rib, unspecified side, subsequent encounter for fracture with routine healing: Secondary | ICD-10-CM | POA: Diagnosis not present

## 2019-03-09 DIAGNOSIS — I10 Essential (primary) hypertension: Secondary | ICD-10-CM | POA: Diagnosis not present

## 2019-03-10 DIAGNOSIS — I4891 Unspecified atrial fibrillation: Secondary | ICD-10-CM | POA: Diagnosis not present

## 2019-03-10 DIAGNOSIS — F0390 Unspecified dementia without behavioral disturbance: Secondary | ICD-10-CM | POA: Diagnosis not present

## 2019-03-10 DIAGNOSIS — Z7901 Long term (current) use of anticoagulants: Secondary | ICD-10-CM | POA: Diagnosis not present

## 2019-03-10 DIAGNOSIS — F329 Major depressive disorder, single episode, unspecified: Secondary | ICD-10-CM | POA: Diagnosis not present

## 2019-03-10 DIAGNOSIS — I10 Essential (primary) hypertension: Secondary | ICD-10-CM | POA: Diagnosis not present

## 2019-03-10 DIAGNOSIS — S2239XD Fracture of one rib, unspecified side, subsequent encounter for fracture with routine healing: Secondary | ICD-10-CM | POA: Diagnosis not present

## 2019-03-10 DIAGNOSIS — Z9181 History of falling: Secondary | ICD-10-CM | POA: Diagnosis not present

## 2019-03-10 DIAGNOSIS — S32009D Unspecified fracture of unspecified lumbar vertebra, subsequent encounter for fracture with routine healing: Secondary | ICD-10-CM | POA: Diagnosis not present

## 2019-03-16 DIAGNOSIS — S2239XD Fracture of one rib, unspecified side, subsequent encounter for fracture with routine healing: Secondary | ICD-10-CM | POA: Diagnosis not present

## 2019-03-16 DIAGNOSIS — I4891 Unspecified atrial fibrillation: Secondary | ICD-10-CM | POA: Diagnosis not present

## 2019-03-16 DIAGNOSIS — F329 Major depressive disorder, single episode, unspecified: Secondary | ICD-10-CM | POA: Diagnosis not present

## 2019-03-16 DIAGNOSIS — F0390 Unspecified dementia without behavioral disturbance: Secondary | ICD-10-CM | POA: Diagnosis not present

## 2019-03-16 DIAGNOSIS — S32009D Unspecified fracture of unspecified lumbar vertebra, subsequent encounter for fracture with routine healing: Secondary | ICD-10-CM | POA: Diagnosis not present

## 2019-03-16 DIAGNOSIS — I10 Essential (primary) hypertension: Secondary | ICD-10-CM | POA: Diagnosis not present

## 2019-03-18 DIAGNOSIS — S2239XD Fracture of one rib, unspecified side, subsequent encounter for fracture with routine healing: Secondary | ICD-10-CM | POA: Diagnosis not present

## 2019-03-18 DIAGNOSIS — I10 Essential (primary) hypertension: Secondary | ICD-10-CM | POA: Diagnosis not present

## 2019-03-18 DIAGNOSIS — F329 Major depressive disorder, single episode, unspecified: Secondary | ICD-10-CM | POA: Diagnosis not present

## 2019-03-18 DIAGNOSIS — I4891 Unspecified atrial fibrillation: Secondary | ICD-10-CM | POA: Diagnosis not present

## 2019-03-18 DIAGNOSIS — F0390 Unspecified dementia without behavioral disturbance: Secondary | ICD-10-CM | POA: Diagnosis not present

## 2019-03-18 DIAGNOSIS — S32009D Unspecified fracture of unspecified lumbar vertebra, subsequent encounter for fracture with routine healing: Secondary | ICD-10-CM | POA: Diagnosis not present

## 2019-03-20 DIAGNOSIS — S32009D Unspecified fracture of unspecified lumbar vertebra, subsequent encounter for fracture with routine healing: Secondary | ICD-10-CM | POA: Diagnosis not present

## 2019-03-20 DIAGNOSIS — S2239XD Fracture of one rib, unspecified side, subsequent encounter for fracture with routine healing: Secondary | ICD-10-CM | POA: Diagnosis not present

## 2019-03-20 DIAGNOSIS — F329 Major depressive disorder, single episode, unspecified: Secondary | ICD-10-CM | POA: Diagnosis not present

## 2019-03-20 DIAGNOSIS — F0390 Unspecified dementia without behavioral disturbance: Secondary | ICD-10-CM | POA: Diagnosis not present

## 2019-03-20 DIAGNOSIS — I10 Essential (primary) hypertension: Secondary | ICD-10-CM | POA: Diagnosis not present

## 2019-03-20 DIAGNOSIS — I4891 Unspecified atrial fibrillation: Secondary | ICD-10-CM | POA: Diagnosis not present

## 2019-03-22 DIAGNOSIS — F329 Major depressive disorder, single episode, unspecified: Secondary | ICD-10-CM | POA: Diagnosis not present

## 2019-03-22 DIAGNOSIS — I4891 Unspecified atrial fibrillation: Secondary | ICD-10-CM | POA: Diagnosis not present

## 2019-03-22 DIAGNOSIS — S32009D Unspecified fracture of unspecified lumbar vertebra, subsequent encounter for fracture with routine healing: Secondary | ICD-10-CM | POA: Diagnosis not present

## 2019-03-22 DIAGNOSIS — S2239XD Fracture of one rib, unspecified side, subsequent encounter for fracture with routine healing: Secondary | ICD-10-CM | POA: Diagnosis not present

## 2019-03-22 DIAGNOSIS — I10 Essential (primary) hypertension: Secondary | ICD-10-CM | POA: Diagnosis not present

## 2019-03-22 DIAGNOSIS — F0390 Unspecified dementia without behavioral disturbance: Secondary | ICD-10-CM | POA: Diagnosis not present

## 2019-03-24 DIAGNOSIS — I4891 Unspecified atrial fibrillation: Secondary | ICD-10-CM | POA: Diagnosis not present

## 2019-03-24 DIAGNOSIS — F329 Major depressive disorder, single episode, unspecified: Secondary | ICD-10-CM | POA: Diagnosis not present

## 2019-03-24 DIAGNOSIS — S32009D Unspecified fracture of unspecified lumbar vertebra, subsequent encounter for fracture with routine healing: Secondary | ICD-10-CM | POA: Diagnosis not present

## 2019-03-24 DIAGNOSIS — F0390 Unspecified dementia without behavioral disturbance: Secondary | ICD-10-CM | POA: Diagnosis not present

## 2019-03-24 DIAGNOSIS — S2239XD Fracture of one rib, unspecified side, subsequent encounter for fracture with routine healing: Secondary | ICD-10-CM | POA: Diagnosis not present

## 2019-03-24 DIAGNOSIS — I10 Essential (primary) hypertension: Secondary | ICD-10-CM | POA: Diagnosis not present

## 2019-03-25 DIAGNOSIS — M545 Low back pain: Secondary | ICD-10-CM | POA: Diagnosis not present

## 2019-03-25 DIAGNOSIS — M25551 Pain in right hip: Secondary | ICD-10-CM | POA: Diagnosis not present

## 2019-03-29 DIAGNOSIS — F0390 Unspecified dementia without behavioral disturbance: Secondary | ICD-10-CM | POA: Diagnosis not present

## 2019-03-29 DIAGNOSIS — I4891 Unspecified atrial fibrillation: Secondary | ICD-10-CM | POA: Diagnosis not present

## 2019-03-29 DIAGNOSIS — S32009D Unspecified fracture of unspecified lumbar vertebra, subsequent encounter for fracture with routine healing: Secondary | ICD-10-CM | POA: Diagnosis not present

## 2019-03-29 DIAGNOSIS — I10 Essential (primary) hypertension: Secondary | ICD-10-CM | POA: Diagnosis not present

## 2019-03-29 DIAGNOSIS — F329 Major depressive disorder, single episode, unspecified: Secondary | ICD-10-CM | POA: Diagnosis not present

## 2019-03-29 DIAGNOSIS — S2239XD Fracture of one rib, unspecified side, subsequent encounter for fracture with routine healing: Secondary | ICD-10-CM | POA: Diagnosis not present

## 2019-03-30 DIAGNOSIS — S32009D Unspecified fracture of unspecified lumbar vertebra, subsequent encounter for fracture with routine healing: Secondary | ICD-10-CM | POA: Diagnosis not present

## 2019-03-30 DIAGNOSIS — S2239XD Fracture of one rib, unspecified side, subsequent encounter for fracture with routine healing: Secondary | ICD-10-CM | POA: Diagnosis not present

## 2019-03-30 DIAGNOSIS — F329 Major depressive disorder, single episode, unspecified: Secondary | ICD-10-CM | POA: Diagnosis not present

## 2019-03-30 DIAGNOSIS — F0390 Unspecified dementia without behavioral disturbance: Secondary | ICD-10-CM | POA: Diagnosis not present

## 2019-03-30 DIAGNOSIS — I10 Essential (primary) hypertension: Secondary | ICD-10-CM | POA: Diagnosis not present

## 2019-03-30 DIAGNOSIS — I4891 Unspecified atrial fibrillation: Secondary | ICD-10-CM | POA: Diagnosis not present

## 2019-04-05 DIAGNOSIS — I10 Essential (primary) hypertension: Secondary | ICD-10-CM | POA: Diagnosis not present

## 2019-04-05 DIAGNOSIS — S32009D Unspecified fracture of unspecified lumbar vertebra, subsequent encounter for fracture with routine healing: Secondary | ICD-10-CM | POA: Diagnosis not present

## 2019-04-05 DIAGNOSIS — S2239XD Fracture of one rib, unspecified side, subsequent encounter for fracture with routine healing: Secondary | ICD-10-CM | POA: Diagnosis not present

## 2019-04-05 DIAGNOSIS — I4891 Unspecified atrial fibrillation: Secondary | ICD-10-CM | POA: Diagnosis not present

## 2019-04-05 DIAGNOSIS — F329 Major depressive disorder, single episode, unspecified: Secondary | ICD-10-CM | POA: Diagnosis not present

## 2019-04-05 DIAGNOSIS — F0390 Unspecified dementia without behavioral disturbance: Secondary | ICD-10-CM | POA: Diagnosis not present

## 2019-04-07 DIAGNOSIS — F0390 Unspecified dementia without behavioral disturbance: Secondary | ICD-10-CM | POA: Diagnosis not present

## 2019-04-07 DIAGNOSIS — S2239XD Fracture of one rib, unspecified side, subsequent encounter for fracture with routine healing: Secondary | ICD-10-CM | POA: Diagnosis not present

## 2019-04-07 DIAGNOSIS — I10 Essential (primary) hypertension: Secondary | ICD-10-CM | POA: Diagnosis not present

## 2019-04-07 DIAGNOSIS — Z7901 Long term (current) use of anticoagulants: Secondary | ICD-10-CM | POA: Diagnosis not present

## 2019-04-07 DIAGNOSIS — S32009D Unspecified fracture of unspecified lumbar vertebra, subsequent encounter for fracture with routine healing: Secondary | ICD-10-CM | POA: Diagnosis not present

## 2019-04-07 DIAGNOSIS — F329 Major depressive disorder, single episode, unspecified: Secondary | ICD-10-CM | POA: Diagnosis not present

## 2019-04-07 DIAGNOSIS — I4891 Unspecified atrial fibrillation: Secondary | ICD-10-CM | POA: Diagnosis not present

## 2019-04-09 DIAGNOSIS — F0390 Unspecified dementia without behavioral disturbance: Secondary | ICD-10-CM | POA: Diagnosis not present

## 2019-04-09 DIAGNOSIS — Z9181 History of falling: Secondary | ICD-10-CM | POA: Diagnosis not present

## 2019-04-09 DIAGNOSIS — I480 Paroxysmal atrial fibrillation: Secondary | ICD-10-CM | POA: Diagnosis not present

## 2019-04-09 DIAGNOSIS — I10 Essential (primary) hypertension: Secondary | ICD-10-CM | POA: Diagnosis not present

## 2019-04-09 DIAGNOSIS — Z7901 Long term (current) use of anticoagulants: Secondary | ICD-10-CM | POA: Diagnosis not present

## 2019-04-09 DIAGNOSIS — S2239XD Fracture of one rib, unspecified side, subsequent encounter for fracture with routine healing: Secondary | ICD-10-CM | POA: Diagnosis not present

## 2019-04-09 DIAGNOSIS — S32019D Unspecified fracture of first lumbar vertebra, subsequent encounter for fracture with routine healing: Secondary | ICD-10-CM | POA: Diagnosis not present

## 2019-04-09 DIAGNOSIS — F329 Major depressive disorder, single episode, unspecified: Secondary | ICD-10-CM | POA: Diagnosis not present

## 2019-04-11 DIAGNOSIS — S32019D Unspecified fracture of first lumbar vertebra, subsequent encounter for fracture with routine healing: Secondary | ICD-10-CM | POA: Diagnosis not present

## 2019-04-11 DIAGNOSIS — S2239XD Fracture of one rib, unspecified side, subsequent encounter for fracture with routine healing: Secondary | ICD-10-CM | POA: Diagnosis not present

## 2019-04-11 DIAGNOSIS — I480 Paroxysmal atrial fibrillation: Secondary | ICD-10-CM | POA: Diagnosis not present

## 2019-04-11 DIAGNOSIS — I10 Essential (primary) hypertension: Secondary | ICD-10-CM | POA: Diagnosis not present

## 2019-04-11 DIAGNOSIS — F0390 Unspecified dementia without behavioral disturbance: Secondary | ICD-10-CM | POA: Diagnosis not present

## 2019-04-11 DIAGNOSIS — F329 Major depressive disorder, single episode, unspecified: Secondary | ICD-10-CM | POA: Diagnosis not present

## 2019-04-13 DIAGNOSIS — F329 Major depressive disorder, single episode, unspecified: Secondary | ICD-10-CM | POA: Diagnosis not present

## 2019-04-13 DIAGNOSIS — F0390 Unspecified dementia without behavioral disturbance: Secondary | ICD-10-CM | POA: Diagnosis not present

## 2019-04-13 DIAGNOSIS — S2239XD Fracture of one rib, unspecified side, subsequent encounter for fracture with routine healing: Secondary | ICD-10-CM | POA: Diagnosis not present

## 2019-04-13 DIAGNOSIS — I480 Paroxysmal atrial fibrillation: Secondary | ICD-10-CM | POA: Diagnosis not present

## 2019-04-13 DIAGNOSIS — I10 Essential (primary) hypertension: Secondary | ICD-10-CM | POA: Diagnosis not present

## 2019-04-13 DIAGNOSIS — S32019D Unspecified fracture of first lumbar vertebra, subsequent encounter for fracture with routine healing: Secondary | ICD-10-CM | POA: Diagnosis not present

## 2019-04-19 DIAGNOSIS — F329 Major depressive disorder, single episode, unspecified: Secondary | ICD-10-CM | POA: Diagnosis not present

## 2019-04-19 DIAGNOSIS — F0390 Unspecified dementia without behavioral disturbance: Secondary | ICD-10-CM | POA: Diagnosis not present

## 2019-04-19 DIAGNOSIS — I480 Paroxysmal atrial fibrillation: Secondary | ICD-10-CM | POA: Diagnosis not present

## 2019-04-19 DIAGNOSIS — I10 Essential (primary) hypertension: Secondary | ICD-10-CM | POA: Diagnosis not present

## 2019-04-19 DIAGNOSIS — S2239XD Fracture of one rib, unspecified side, subsequent encounter for fracture with routine healing: Secondary | ICD-10-CM | POA: Diagnosis not present

## 2019-04-19 DIAGNOSIS — S32019D Unspecified fracture of first lumbar vertebra, subsequent encounter for fracture with routine healing: Secondary | ICD-10-CM | POA: Diagnosis not present

## 2019-04-21 DIAGNOSIS — F329 Major depressive disorder, single episode, unspecified: Secondary | ICD-10-CM | POA: Diagnosis not present

## 2019-04-21 DIAGNOSIS — I480 Paroxysmal atrial fibrillation: Secondary | ICD-10-CM | POA: Diagnosis not present

## 2019-04-21 DIAGNOSIS — I10 Essential (primary) hypertension: Secondary | ICD-10-CM | POA: Diagnosis not present

## 2019-04-21 DIAGNOSIS — F0390 Unspecified dementia without behavioral disturbance: Secondary | ICD-10-CM | POA: Diagnosis not present

## 2019-04-21 DIAGNOSIS — S2239XD Fracture of one rib, unspecified side, subsequent encounter for fracture with routine healing: Secondary | ICD-10-CM | POA: Diagnosis not present

## 2019-04-21 DIAGNOSIS — S32019D Unspecified fracture of first lumbar vertebra, subsequent encounter for fracture with routine healing: Secondary | ICD-10-CM | POA: Diagnosis not present

## 2019-04-25 DIAGNOSIS — I480 Paroxysmal atrial fibrillation: Secondary | ICD-10-CM | POA: Diagnosis not present

## 2019-04-25 DIAGNOSIS — F0390 Unspecified dementia without behavioral disturbance: Secondary | ICD-10-CM | POA: Diagnosis not present

## 2019-04-25 DIAGNOSIS — F329 Major depressive disorder, single episode, unspecified: Secondary | ICD-10-CM | POA: Diagnosis not present

## 2019-04-25 DIAGNOSIS — S2239XD Fracture of one rib, unspecified side, subsequent encounter for fracture with routine healing: Secondary | ICD-10-CM | POA: Diagnosis not present

## 2019-04-25 DIAGNOSIS — S32019D Unspecified fracture of first lumbar vertebra, subsequent encounter for fracture with routine healing: Secondary | ICD-10-CM | POA: Diagnosis not present

## 2019-04-25 DIAGNOSIS — I10 Essential (primary) hypertension: Secondary | ICD-10-CM | POA: Diagnosis not present

## 2019-04-28 DIAGNOSIS — F0390 Unspecified dementia without behavioral disturbance: Secondary | ICD-10-CM | POA: Diagnosis not present

## 2019-04-28 DIAGNOSIS — S32019D Unspecified fracture of first lumbar vertebra, subsequent encounter for fracture with routine healing: Secondary | ICD-10-CM | POA: Diagnosis not present

## 2019-04-28 DIAGNOSIS — I480 Paroxysmal atrial fibrillation: Secondary | ICD-10-CM | POA: Diagnosis not present

## 2019-04-28 DIAGNOSIS — S2239XD Fracture of one rib, unspecified side, subsequent encounter for fracture with routine healing: Secondary | ICD-10-CM | POA: Diagnosis not present

## 2019-04-28 DIAGNOSIS — F329 Major depressive disorder, single episode, unspecified: Secondary | ICD-10-CM | POA: Diagnosis not present

## 2019-04-28 DIAGNOSIS — I10 Essential (primary) hypertension: Secondary | ICD-10-CM | POA: Diagnosis not present

## 2019-05-05 DIAGNOSIS — I10 Essential (primary) hypertension: Secondary | ICD-10-CM | POA: Diagnosis not present

## 2019-05-05 DIAGNOSIS — F0390 Unspecified dementia without behavioral disturbance: Secondary | ICD-10-CM | POA: Diagnosis not present

## 2019-05-05 DIAGNOSIS — F329 Major depressive disorder, single episode, unspecified: Secondary | ICD-10-CM | POA: Diagnosis not present

## 2019-05-05 DIAGNOSIS — S32019D Unspecified fracture of first lumbar vertebra, subsequent encounter for fracture with routine healing: Secondary | ICD-10-CM | POA: Diagnosis not present

## 2019-05-05 DIAGNOSIS — I480 Paroxysmal atrial fibrillation: Secondary | ICD-10-CM | POA: Diagnosis not present

## 2019-05-05 DIAGNOSIS — S2239XD Fracture of one rib, unspecified side, subsequent encounter for fracture with routine healing: Secondary | ICD-10-CM | POA: Diagnosis not present

## 2019-05-09 DIAGNOSIS — Z7901 Long term (current) use of anticoagulants: Secondary | ICD-10-CM | POA: Diagnosis not present

## 2019-05-09 DIAGNOSIS — F329 Major depressive disorder, single episode, unspecified: Secondary | ICD-10-CM | POA: Diagnosis not present

## 2019-05-09 DIAGNOSIS — S2239XD Fracture of one rib, unspecified side, subsequent encounter for fracture with routine healing: Secondary | ICD-10-CM | POA: Diagnosis not present

## 2019-05-09 DIAGNOSIS — S32019D Unspecified fracture of first lumbar vertebra, subsequent encounter for fracture with routine healing: Secondary | ICD-10-CM | POA: Diagnosis not present

## 2019-05-09 DIAGNOSIS — I480 Paroxysmal atrial fibrillation: Secondary | ICD-10-CM | POA: Diagnosis not present

## 2019-05-09 DIAGNOSIS — I10 Essential (primary) hypertension: Secondary | ICD-10-CM | POA: Diagnosis not present

## 2019-05-09 DIAGNOSIS — F0390 Unspecified dementia without behavioral disturbance: Secondary | ICD-10-CM | POA: Diagnosis not present

## 2019-05-09 DIAGNOSIS — Z9181 History of falling: Secondary | ICD-10-CM | POA: Diagnosis not present

## 2019-05-10 ENCOUNTER — Other Ambulatory Visit: Payer: Self-pay

## 2019-05-10 ENCOUNTER — Emergency Department (HOSPITAL_COMMUNITY)
Admission: EM | Admit: 2019-05-10 | Discharge: 2019-05-11 | Disposition: A | Discharge status: Home / Self Care | Payer: Medicare Other | Attending: Emergency Medicine | Admitting: Emergency Medicine

## 2019-05-10 DIAGNOSIS — Z79899 Other long term (current) drug therapy: Secondary | ICD-10-CM | POA: Diagnosis not present

## 2019-05-10 DIAGNOSIS — F0390 Unspecified dementia without behavioral disturbance: Secondary | ICD-10-CM | POA: Diagnosis not present

## 2019-05-10 DIAGNOSIS — R4020 Unspecified coma: Secondary | ICD-10-CM | POA: Diagnosis not present

## 2019-05-10 DIAGNOSIS — Z7901 Long term (current) use of anticoagulants: Secondary | ICD-10-CM | POA: Diagnosis not present

## 2019-05-10 DIAGNOSIS — I10 Essential (primary) hypertension: Secondary | ICD-10-CM | POA: Insufficient documentation

## 2019-05-10 DIAGNOSIS — R42 Dizziness and giddiness: Secondary | ICD-10-CM | POA: Diagnosis not present

## 2019-05-10 DIAGNOSIS — I491 Atrial premature depolarization: Secondary | ICD-10-CM | POA: Diagnosis not present

## 2019-05-10 DIAGNOSIS — R404 Transient alteration of awareness: Secondary | ICD-10-CM | POA: Diagnosis not present

## 2019-05-10 DIAGNOSIS — N390 Urinary tract infection, site not specified: Secondary | ICD-10-CM

## 2019-05-10 DIAGNOSIS — R0902 Hypoxemia: Secondary | ICD-10-CM | POA: Diagnosis not present

## 2019-05-10 LAB — CBG MONITORING, ED: Glucose-Capillary: 98 mg/dL (ref 70–99)

## 2019-05-10 MED ORDER — SODIUM CHLORIDE 0.9% FLUSH
3.0000 mL | Freq: Once | INTRAVENOUS | Status: AC
  Administered 2019-05-10: 3 mL via INTRAVENOUS

## 2019-05-10 NOTE — ED Triage Notes (Addendum)
Pt bib by [facility] from outside facility with reports of feeling dizzy/nearsyncope. Ems reports on arrival patient could not stand due to dizziness. No other neuro deficits reported. Hx of cva, dementia, and afib.

## 2019-05-11 ENCOUNTER — Emergency Department (HOSPITAL_COMMUNITY): Payer: Medicare Other

## 2019-05-11 DIAGNOSIS — S32019D Unspecified fracture of first lumbar vertebra, subsequent encounter for fracture with routine healing: Secondary | ICD-10-CM | POA: Diagnosis not present

## 2019-05-11 DIAGNOSIS — S2239XD Fracture of one rib, unspecified side, subsequent encounter for fracture with routine healing: Secondary | ICD-10-CM | POA: Diagnosis not present

## 2019-05-11 DIAGNOSIS — Z7401 Bed confinement status: Secondary | ICD-10-CM | POA: Diagnosis not present

## 2019-05-11 DIAGNOSIS — F0390 Unspecified dementia without behavioral disturbance: Secondary | ICD-10-CM | POA: Diagnosis not present

## 2019-05-11 DIAGNOSIS — I10 Essential (primary) hypertension: Secondary | ICD-10-CM | POA: Diagnosis not present

## 2019-05-11 DIAGNOSIS — R42 Dizziness and giddiness: Secondary | ICD-10-CM | POA: Diagnosis not present

## 2019-05-11 DIAGNOSIS — F329 Major depressive disorder, single episode, unspecified: Secondary | ICD-10-CM | POA: Diagnosis not present

## 2019-05-11 DIAGNOSIS — N390 Urinary tract infection, site not specified: Secondary | ICD-10-CM | POA: Diagnosis not present

## 2019-05-11 DIAGNOSIS — R404 Transient alteration of awareness: Secondary | ICD-10-CM | POA: Diagnosis not present

## 2019-05-11 DIAGNOSIS — I480 Paroxysmal atrial fibrillation: Secondary | ICD-10-CM | POA: Diagnosis not present

## 2019-05-11 DIAGNOSIS — M2550 Pain in unspecified joint: Secondary | ICD-10-CM | POA: Diagnosis not present

## 2019-05-11 DIAGNOSIS — R4020 Unspecified coma: Secondary | ICD-10-CM | POA: Diagnosis not present

## 2019-05-11 LAB — CBC
HCT: 36 % (ref 36.0–46.0)
Hemoglobin: 11.3 g/dL — ABNORMAL LOW (ref 12.0–15.0)
MCH: 29.7 pg (ref 26.0–34.0)
MCHC: 31.4 g/dL (ref 30.0–36.0)
MCV: 94.5 fL (ref 80.0–100.0)
Platelets: 337 10*3/uL (ref 150–400)
RBC: 3.81 MIL/uL — ABNORMAL LOW (ref 3.87–5.11)
RDW: 14.8 % (ref 11.5–15.5)
WBC: 13.7 10*3/uL — ABNORMAL HIGH (ref 4.0–10.5)
nRBC: 0 % (ref 0.0–0.2)

## 2019-05-11 LAB — BASIC METABOLIC PANEL
Anion gap: 8 (ref 5–15)
BUN: 26 mg/dL — ABNORMAL HIGH (ref 8–23)
CO2: 33 mmol/L — ABNORMAL HIGH (ref 22–32)
Calcium: 8.7 mg/dL — ABNORMAL LOW (ref 8.9–10.3)
Chloride: 96 mmol/L — ABNORMAL LOW (ref 98–111)
Creatinine, Ser: 1.33 mg/dL — ABNORMAL HIGH (ref 0.44–1.00)
GFR calc Af Amer: 41 mL/min — ABNORMAL LOW (ref >60)
GFR calc non Af Amer: 35 mL/min — ABNORMAL LOW (ref >60)
Glucose, Bld: 113 mg/dL — ABNORMAL HIGH (ref 70–99)
Potassium: 4.6 mmol/L (ref 3.5–5.1)
Sodium: 137 mmol/L (ref 135–145)

## 2019-05-11 LAB — URINALYSIS, ROUTINE W REFLEX MICROSCOPIC
Bilirubin Urine: NEGATIVE
Glucose, UA: NEGATIVE mg/dL
Hgb urine dipstick: NEGATIVE
Ketones, ur: NEGATIVE mg/dL
Nitrite: POSITIVE — AB
Protein, ur: NEGATIVE mg/dL
Specific Gravity, Urine: 1.017 (ref 1.005–1.030)
pH: 5 (ref 5.0–8.0)

## 2019-05-11 LAB — DIFFERENTIAL
Basophils Absolute: 0 10*3/uL (ref 0.0–0.1)
Basophils Relative: 0 %
Eosinophils Absolute: 0.3 10*3/uL (ref 0.0–0.5)
Eosinophils Relative: 2 %
Lymphocytes Relative: 18 %
Lymphs Abs: 2.4 10*3/uL (ref 0.7–4.0)
Monocytes Absolute: 1 10*3/uL (ref 0.1–1.0)
Monocytes Relative: 7 %
Neutro Abs: 9.5 10*3/uL — ABNORMAL HIGH (ref 1.7–7.7)
Neutrophils Relative %: 72 %

## 2019-05-11 LAB — RAPID URINE DRUG SCREEN, HOSP PERFORMED
Amphetamines: NOT DETECTED
Barbiturates: NOT DETECTED
Benzodiazepines: NOT DETECTED
Cocaine: NOT DETECTED
Opiates: NOT DETECTED
Tetrahydrocannabinol: NOT DETECTED

## 2019-05-11 LAB — APTT: aPTT: 46 seconds — ABNORMAL HIGH (ref 24–36)

## 2019-05-11 LAB — ETHANOL: Alcohol, Ethyl (B): <10 mg/dL (ref <10)

## 2019-05-11 LAB — DIGOXIN LEVEL: Digoxin Level: 0.2 ng/mL — ABNORMAL LOW (ref 0.8–2.0)

## 2019-05-11 LAB — PROTIME-INR
INR: 2.5 — ABNORMAL HIGH (ref 0.8–1.2)
Prothrombin Time: 26.7 seconds — ABNORMAL HIGH (ref 11.4–15.2)

## 2019-05-11 MED ORDER — CEFUROXIME AXETIL 250 MG PO TABS: 250.0000 mg | ORAL_TABLET | Freq: Two times a day (BID) | ORAL | 0 refills | Status: DC

## 2019-05-11 MED ORDER — SODIUM CHLORIDE 0.9 % IV SOLN
1.0000 g | Freq: Once | INTRAVENOUS | Status: AC
  Administered 2019-05-11: 1 g via INTRAVENOUS
  Filled 2019-05-11: qty 10

## 2019-05-11 MED ORDER — GADOBUTROL 1 MMOL/ML IV SOLN
7.0000 mL | Freq: Once | INTRAVENOUS | Status: AC | PRN
  Administered 2019-05-11: 7 mL via INTRAVENOUS

## 2019-05-11 NOTE — ED Provider Notes (Signed)
[facility]   CSN: [id number] Arrival date & time: 05/10/19  2326    History   Chief Complaint Chief Complaint  Patient presents with   Dizziness    HPI Watt ClimesFrances Ditter is a 85 y.o. female.     Patient presents to the emergency department for evaluation of dizziness.  Patient has been experiencing severe dizziness today.  No associated headache, vision change, chest pain, palpitations, shortness of breath.  She has not had any difficulty with speech, no unilateral numbness, weakness.  Patient reports that she cannot sit up or stand up because of severe worsening of spinning and dizzy sensation with changes in position.     Past Medical History:  Diagnosis Date   Depression    Glaucoma    Hyperlipidemia    Hypertension    Osteoporosis     Patient Active Problem List   Diagnosis Date Noted   Rib fracture-12 01/12/2019   Fracture of transverse process of lumbar vertebra L1-L2 01/12/2019   Dementia (HCC) 01/12/2019   Fall 01/12/2019   Leukocytosis 01/12/2019   Depression    Hypertension     Past Surgical History:  Procedure Laterality Date   ABDOMINAL HYSTERECTOMY     ANKLE ARTHROSCOPY     CHOLECYSTECTOMY     EYE SURGERY     RECTOCELE REPAIR       OB History   No obstetric history on file.      Home Medications    Prior to Admission medications   Medication Sig Start Date End Date Taking? Authorizing Provider  acetaminophen (TYLENOL) 500 MG tablet Take 1,000 mg by mouth every 6 (six) hours as needed for mild pain.   Yes [provider]  calcium carbonate (OSCAL) 1500 (600 Ca) MG TABS tablet Take 600 mg of elemental calcium by mouth daily with breakfast.   Yes [provider]  cholecalciferol (VITAMIN D3) 25 MCG (1000 UT) tablet Take 1,000 Units by mouth daily.   Yes [provider]  digoxin (LANOXIN) 0.125 MG tablet Take 0.125 mg by mouth every other day.  Mon, Wed, Fri only   Yes [provider]  donepezil (ARICEPT) 5 MG tablet Take 5 mg by mouth at bedtime. 10/15/18  Yes [provider]  DULoxetine (CYMBALTA) 60 MG capsule Take 60 mg by mouth daily. 05/22/14  Yes [provider]  furosemide (LASIX) 40 MG tablet Take 20 mg by mouth every other day.  11/06/18  Yes [provider]  latanoprost (XALATAN) 0.005 % ophthalmic solution Place 1 drop into both eyes at bedtime.   Yes [provider]  loperamide (IMODIUM A-D) 2 MG tablet Take 2 mg by mouth 4 (four) times daily as needed for diarrhea or loose stools.   Yes [provider]  meclizine (ANTIVERT) 25 MG tablet Take 25 mg by mouth every 6 (six) hours as needed for dizziness.   Yes [provider]  ondansetron (ZOFRAN) 8 MG tablet Take 8 mg by mouth every 8 (eight) hours as needed for nausea or vomiting.   Yes [provider]  timolol (TIMOPTIC) 0.5 % ophthalmic solution Place 1 drop into both eyes daily. 06/15/14  Yes [provider]  traMADol (ULTRAM) 50 MG tablet Take 50 mg by mouth every 8 (eight) hours as needed for moderate pain.   Yes [provider]  traZODone (DESYREL) 50 MG tablet Take 50 mg by mouth at bedtime. 10/15/18  Yes [provider]  warfarin (  COUMADIN) 2 MG tablet Take 2-3 mg by mouth See admin instructions. Take 1 tablet on Tuesday, Wednesday, Thursday, Saturday and Sunday then take 1 and 1/2 tablets on Monday and Friday 04/13/14  Yes [provider]  cefUROXime (CEFTIN) 250 MG tablet Take 1 tablet (250 mg total) by mouth 2 (two) times daily with a meal. 05/11/19   Sophina Mitten, Canary Brimhristopher J, MD  levofloxacin (LEVAQUIN) 500 MG tablet Take 1 tablet (500 mg total) by mouth daily. Patient not taking: Reported on 01/12/2019 06/29/14   Loren RacerYelverton, David, MD  polyethylene glycol [facility](MIRALAX / Ethelene HalGLYCOLAX) packet Take 17 g by mouth daily as needed for moderate constipation or severe constipation. Patient  not taking: Reported on 05/11/2019 01/16/19   Dimple NanasAmin, Ankit Chirag, MD  senna-docusate (SENOKOT-S) 8.6-50 MG tablet Take 1 tablet by mouth at bedtime as needed for up to 30 doses for mild constipation. Patient not taking: Reported on 05/11/2019 01/16/19   Dimple NanasAmin, Ankit Chirag, MD    Family History No family history on file.  Social History Social History   Tobacco Use   Smoking status: Never Smoker   Smokeless tobacco: Never Used  Substance Use Topics   Alcohol use: No   Drug use: No     Allergies   Ciprofloxacin hcl, Effexor [venlafaxine], Gantrisin [sulfisoxazole], Levaquin [levofloxacin], and Sulfa antibiotics   Review of Systems Review of Systems  Neurological: Positive for dizziness.  All other systems reviewed and are negative.    Physical Exam Updated Vital Signs BP 133/71    Pulse 95    Temp 98.6 F (37 C) (Oral)    Resp 16    SpO2 96%   Physical Exam Vitals signs and nursing note reviewed.  Constitutional:      General: She is not in acute distress.    Appearance: Normal appearance. She is well-developed.  HENT:     Head: Normocephalic and atraumatic.     Right Ear: Hearing normal.     Left Ear: Hearing normal.     Nose: Nose normal.  Eyes:     Conjunctiva/sclera: Conjunctivae normal.     Pupils: Pupils are equal, round, and reactive to light.  Neck:     Musculoskeletal: Normal range of motion and neck supple.  Cardiovascular:     Rate and Rhythm: Regular rhythm.     Heart sounds: S1 normal and S2 normal. No murmur. No friction rub. No gallop.   Pulmonary:     Effort: Pulmonary effort is normal. No respiratory distress.     Breath sounds: Normal breath sounds.  Chest:     Chest wall: No tenderness.  Abdominal:     General: Bowel sounds are normal.     Palpations: Abdomen is soft.     Tenderness: There is no abdominal tenderness. There is no guarding or rebound. Negative signs include Murphy's sign and McBurney's sign.     Hernia: No hernia is present.    Musculoskeletal: Normal range of motion.  Skin:    General: Skin is warm and dry.     Findings: No rash.  Neurological:     Mental Status: She is alert and oriented to person, place, and time.     GCS: GCS eye subscore is 4. GCS verbal subscore is 5. GCS motor subscore is 6.     Cranial Nerves: No cranial nerve deficit.     Sensory: No sensory deficit.     Motor: Tremor present.     Coordination: Finger-Nose-Finger Test abnormal (Left greater than right).  Psychiatric:        Speech: Speech normal.        Behavior: Behavior normal.        Thought Content: Thought content normal.      ED Treatments / Results  Labs (all labs ordered are listed, but only abnormal results are displayed) Labs Reviewed  BASIC METABOLIC PANEL - Abnormal; Notable for the following components:      Result Value   Chloride 96 (*)    CO2 33 (*)    Glucose, Bld 113 (*)    BUN 26 (*)    Creatinine, Ser 1.33 (*)    Calcium 8.7 (*)    GFR calc non Af Amer 35 (*)    GFR calc Af Amer 41 (*)    All other components within normal limits  CBC - Abnormal; Notable for the following components:   WBC 13.7 (*)    RBC 3.81 (*)    Hemoglobin 11.3 (*)    All other components within normal limits  URINALYSIS, ROUTINE W REFLEX MICROSCOPIC - Abnormal; Notable for the following components:   APPearance HAZY (*)    Nitrite POSITIVE (*)    Leukocytes,Ua LARGE (*)    Bacteria, UA MANY (*)    All other components within normal limits  DIGOXIN LEVEL - Abnormal; Notable for the following components:   Digoxin Level 0.2 (*)    All other components within normal limits  PROTIME-INR - Abnormal; Notable for the following components:   Prothrombin Time 26.7 (*)    INR 2.5 (*)    All other components within normal limits  APTT - Abnormal; Notable for the following components:   aPTT 46 (*)    All other components within normal limits  DIFFERENTIAL - Abnormal; Notable for the following components:   Neutro Abs 9.5 (*)     All other components within normal limits  URINE CULTURE  ETHANOL  RAPID URINE DRUG SCREEN, HOSP PERFORMED  CBG MONITORING, ED  CBG MONITORING, ED    EKG None  Radiology Ct Head Wo Contrast  Result Date: 05/11/2019 CLINICAL DATA:  Vertigo EXAM: CT HEAD WITHOUT CONTRAST TECHNIQUE: Contiguous axial images were obtained from the base of the skull through the vertex without intravenous contrast. COMPARISON:  01/12/2019 FINDINGS: Brain: There is atrophy and chronic small vessel disease changes. No acute intracranial abnormality. Specifically, no hemorrhage, hydrocephalus, mass lesion, acute infarction, or significant intracranial injury. Vascular: No hyperdense vessel or unexpected calcification. Skull: No acute calvarial abnormality. Sinuses/Orbits: Visualized paranasal sinuses and mastoids clear. Orbital soft tissues unremarkable. Other: None IMPRESSION: Atrophy, chronic microvascular disease. No acute intracranial abnormality. Electronically Signed   By: Rolm Baptise M.D.   On: 05/11/2019 01:01   Mr Angio Head Wo Contrast  Result Date: 05/11/2019 CLINICAL DATA:  Vertigo with altered level of consciousness. EXAM: MR HEAD WITHOUT CONTRAST MR CIRCLE OF WILLIS WITHOUT CONTRAST MRA OF THE NECK WITHOUT AND WITH CONTRAST TECHNIQUE: Multiplanar, multiecho pulse sequences of the brain, circle of willis and surrounding structures were obtained without intravenous contrast. Angiographic images of the neck were obtained using MRA technique without and with intravenous contrast. CONTRAST:  7 cc Gadavist intravenous COMPARISON:  Head CT from earlier today.  Brain MRI 03/29/2013 FINDINGS: MR HEAD FINDINGS Brain: No acute infarction, hemorrhage, hydrocephalus, extra-axial collection or mass lesion. Remote small vessel infarcts in the bilateral cerebellum, left caudate head, right thalamus, and right corona radiata. Mild for age cerebral volume loss. Vascular: Arterial findings below. Skull and upper cervical  spine:  Negative for marrow lesion Sinuses/Orbits: Opacified [place] air cell on the right. Bilateral cataract resection. MR CIRCLE OF WILLIS FINDINGS Carotid, vertebral, and basilar arteries are patent. There is mild-to-moderate narrowing of the left ICA at the anterior genu. Fetal type left PCA. No branch occlusion or critical stenosis. Probable mild atheromatous irregularity of medium size branches. No definite or significant aneurysm. MRA NECK FINDINGS There is antegrade flow in both carotid and vertebral arteries. Carotid arteries are smooth and widely patent. No atheromatous narrowing seen at the ICA bulbs. Proximal subclavian artery and left vertebral origin is not covered on the left. The areas of bilateral vertebral artery that are covered show no stenosis or beading. IMPRESSION: Brain MRI: 1. No acute finding including posterior circulation infarct. 2. Chronic small vessel ischemia with remote small-vessel infarcts which were also seen in 2014. Intracranial MRA: No emergent finding or proximal flow limiting stenosis. Neck MRA: 1. No emergent finding or flow limiting stenosis. 2. The left vertebral origin was not covered. Electronically Signed   By: Marnee Spring M.D.   On: 05/11/2019 05:28   Mr Angiogram Neck W Or Wo Contrast  Result Date: 05/11/2019 CLINICAL DATA:  Vertigo with altered level of consciousness. EXAM: MR HEAD WITHOUT CONTRAST MR CIRCLE OF WILLIS WITHOUT CONTRAST MRA OF THE NECK WITHOUT AND WITH CONTRAST TECHNIQUE: Multiplanar, multiecho pulse sequences of the brain, circle of willis and surrounding structures were obtained without intravenous contrast. Angiographic images of the neck were obtained using MRA technique without and with intravenous contrast. CONTRAST:  7 cc Gadavist intravenous COMPARISON:  Head CT from earlier today.  Brain MRI 03/29/2013 FINDINGS: MR HEAD FINDINGS Brain: No acute infarction, hemorrhage, hydrocephalus, extra-axial collection or mass lesion. Remote small vessel  infarcts in the bilateral cerebellum, left caudate head, right thalamus, and right corona radiata. Mild for age cerebral volume loss. Vascular: Arterial findings below. Skull and upper cervical spine: Negative for marrow lesion Sinuses/Orbits: Opacified [place] air cell on the right. Bilateral cataract resection. MR CIRCLE OF WILLIS FINDINGS Carotid, vertebral, and basilar arteries are patent. There is mild-to-moderate narrowing of the left ICA at the anterior genu. Fetal type left PCA. No branch occlusion or critical stenosis. Probable mild atheromatous irregularity of medium size branches. No definite or significant aneurysm. MRA NECK FINDINGS There is antegrade flow in both carotid and vertebral arteries. Carotid arteries are smooth and widely patent. No atheromatous narrowing seen at the ICA bulbs. Proximal subclavian artery and left vertebral origin is not covered on the left. The areas of bilateral vertebral artery that are covered show no stenosis or beading. IMPRESSION: Brain MRI: 1. No acute finding including posterior circulation infarct. 2. Chronic small vessel ischemia with remote small-vessel infarcts which were also seen in 2014. Intracranial MRA: No emergent finding or proximal flow limiting stenosis. Neck MRA: 1. No emergent finding or flow limiting stenosis. 2. The left vertebral origin was not covered. Electronically Signed   By: Marnee Spring M.D.   On: 05/11/2019 05:28   Mr Brain Wo Contrast  Result Date: 05/11/2019 CLINICAL DATA:  Vertigo with altered level of consciousness. EXAM: MR HEAD WITHOUT CONTRAST MR CIRCLE OF WILLIS WITHOUT CONTRAST MRA OF THE NECK WITHOUT AND WITH CONTRAST TECHNIQUE: Multiplanar, multiecho pulse sequences of the brain, circle of willis and surrounding structures were obtained without intravenous contrast. Angiographic images of the neck were obtained using MRA technique without and with intravenous contrast. CONTRAST:  7 cc Gadavist intravenous COMPARISON:  Head CT  from earlier today.  Brain  MRI 03/29/2013 FINDINGS: MR HEAD FINDINGS Brain: No acute infarction, hemorrhage, hydrocephalus, extra-axial collection or mass lesion. Remote small vessel infarcts in the bilateral cerebellum, left caudate head, right thalamus, and right corona radiata. Mild for age cerebral volume loss. Vascular: Arterial findings below. Skull and upper cervical spine: Negative for marrow lesion Sinuses/Orbits: Opacified [place] air cell on the right. Bilateral cataract resection. MR CIRCLE OF WILLIS FINDINGS Carotid, vertebral, and basilar arteries are patent. There is mild-to-moderate narrowing of the left ICA at the anterior genu. Fetal type left PCA. No branch occlusion or critical stenosis. Probable mild atheromatous irregularity of medium size branches. No definite or significant aneurysm. MRA NECK FINDINGS There is antegrade flow in both carotid and vertebral arteries. Carotid arteries are smooth and widely patent. No atheromatous narrowing seen at the ICA bulbs. Proximal subclavian artery and left vertebral origin is not covered on the left. The areas of bilateral vertebral artery that are covered show no stenosis or beading. IMPRESSION: Brain MRI: 1. No acute finding including posterior circulation infarct. 2. Chronic small vessel ischemia with remote small-vessel infarcts which were also seen in 2014. Intracranial MRA: No emergent finding or proximal flow limiting stenosis. Neck MRA: 1. No emergent finding or flow limiting stenosis. 2. The left vertebral origin was not covered. Electronically Signed   By: Marnee SpringJonathon  Watts M.D.   On: 05/11/2019 05:28    Procedures Procedures (including critical care time)  Medications Ordered in ED Medications  cefTRIAXone (ROCEPHIN) 1 g in sodium chloride 0.9 % 100 mL IVPB (has no administration in time range)  sodium chloride flush (NS) 0.9 % injection 3 mL (3 mLs Intravenous Given 05/10/19 2354)  gadobutrol (GADAVIST) 1 MMOL/ML injection 7 mL (7 mLs  Intravenous Contrast Given 05/11/19 0429)     Initial Impression / Assessment and Plan / ED Course  I have reviewed the triage vital signs and the nursing notes.  Pertinent labs & imaging results that were available during my care of the patient were reviewed by me and considered in my medical decision making (see chart for details).        Patient sent to the emergency department for evaluation of dizziness.  Patient had onset of dizziness earlier today.  She has been feeling like she might pass out.  Neurologic examination on arrival did reveal ataxia with upper extremities bilaterally but no focal findings.  No sensory deficit and essentially symmetric weakness of all extremities.  She does have a history of a stroke.  She has been extensively worked up.  This includes MRI/MRA to rule out posterior circulation stroke.  No acute findings are present on imaging.  Lab work was unrevealing other than suggestion of urinary tract infection which is likely the cause of her symptoms.  Patient is administered Rocephin here in the ER will continue outpatient Ceftin, culture pending.  Final Clinical Impressions(s) / ED Diagnoses   Final diagnoses:  Urinary tract infection without hematuria, site unspecified    ED Discharge Orders         Ordered    cefUROXime (CEFTIN) 250 MG tablet  2 times daily with meals     05/11/19 0541           Gilda CreasePollina, Ethel Meisenheimer J, MD 05/11/19 [id number]

## 2019-05-12 LAB — URINE CULTURE: Culture: >=100000 — AB

## 2019-05-13 ENCOUNTER — Telehealth: Payer: Self-pay

## 2019-05-13 NOTE — Telephone Encounter (Signed)
Post ED Visit - Positive Culture Follow-up  Culture report reviewed by antimicrobial stewardship pharmacist: [place] Team []  Elenor Quinones, Pharm.D. []  Heide Guile, Pharm.D., BCPS AQ-ID []  Parks Neptune, Pharm.D., BCPS []  Alycia Rossetti, Pharm.D., BCPS []  [place], [place].D., BCPS, AAHIVP []  Legrand Como, Pharm.D., BCPS, AAHIVP []  Salome Arnt, PharmD, BCPS []  Johnnette Gourd, PharmD, BCPS []  Hughes Better, PharmD, BCPS []  Leeroy Cha, PharmD []  Laqueta Linden, PharmD, BCPS []  Albertina Parr, PharmD [place] Team []  Leodis Sias, PharmD []  Lindell Spar, PharmD []  Royetta Asal, PharmD []  Graylin Shiver, Rph []  Rema Fendt) Glennon Mac, PharmD []  Arlyn Dunning, PharmD []  Netta Cedars, PharmD []  Dia Sitter, PharmD []  Leone Haven, PharmD []  Gretta Arab, PharmD []  Theodis Shove, PharmD []  Peggyann Juba, PharmD []  Reuel Boom, PharmD   Positive urine culture Treated with Cefuroxime, organism sensitive to the same and no further patient follow-up is required at this time.  Lessa Huge, Carolynn Comment 05/13/2019, 8:21 AM

## 2019-05-16 DIAGNOSIS — F329 Major depressive disorder, single episode, unspecified: Secondary | ICD-10-CM | POA: Diagnosis not present

## 2019-05-16 DIAGNOSIS — S32019D Unspecified fracture of first lumbar vertebra, subsequent encounter for fracture with routine healing: Secondary | ICD-10-CM | POA: Diagnosis not present

## 2019-05-16 DIAGNOSIS — S2239XD Fracture of one rib, unspecified side, subsequent encounter for fracture with routine healing: Secondary | ICD-10-CM | POA: Diagnosis not present

## 2019-05-16 DIAGNOSIS — I480 Paroxysmal atrial fibrillation: Secondary | ICD-10-CM | POA: Diagnosis not present

## 2019-05-16 DIAGNOSIS — F0390 Unspecified dementia without behavioral disturbance: Secondary | ICD-10-CM | POA: Diagnosis not present

## 2019-05-16 DIAGNOSIS — I10 Essential (primary) hypertension: Secondary | ICD-10-CM | POA: Diagnosis not present

## 2019-05-19 DIAGNOSIS — F329 Major depressive disorder, single episode, unspecified: Secondary | ICD-10-CM | POA: Diagnosis not present

## 2019-05-19 DIAGNOSIS — I10 Essential (primary) hypertension: Secondary | ICD-10-CM | POA: Diagnosis not present

## 2019-05-19 DIAGNOSIS — S32019D Unspecified fracture of first lumbar vertebra, subsequent encounter for fracture with routine healing: Secondary | ICD-10-CM | POA: Diagnosis not present

## 2019-05-19 DIAGNOSIS — F0390 Unspecified dementia without behavioral disturbance: Secondary | ICD-10-CM | POA: Diagnosis not present

## 2019-05-19 DIAGNOSIS — S2239XD Fracture of one rib, unspecified side, subsequent encounter for fracture with routine healing: Secondary | ICD-10-CM | POA: Diagnosis not present

## 2019-05-19 DIAGNOSIS — I480 Paroxysmal atrial fibrillation: Secondary | ICD-10-CM | POA: Diagnosis not present

## 2019-05-24 DIAGNOSIS — F329 Major depressive disorder, single episode, unspecified: Secondary | ICD-10-CM | POA: Diagnosis not present

## 2019-05-24 DIAGNOSIS — F0390 Unspecified dementia without behavioral disturbance: Secondary | ICD-10-CM | POA: Diagnosis not present

## 2019-05-24 DIAGNOSIS — I10 Essential (primary) hypertension: Secondary | ICD-10-CM | POA: Diagnosis not present

## 2019-05-24 DIAGNOSIS — S32019D Unspecified fracture of first lumbar vertebra, subsequent encounter for fracture with routine healing: Secondary | ICD-10-CM | POA: Diagnosis not present

## 2019-05-24 DIAGNOSIS — S2239XD Fracture of one rib, unspecified side, subsequent encounter for fracture with routine healing: Secondary | ICD-10-CM | POA: Diagnosis not present

## 2019-05-24 DIAGNOSIS — I480 Paroxysmal atrial fibrillation: Secondary | ICD-10-CM | POA: Diagnosis not present

## 2019-05-31 DIAGNOSIS — S32019D Unspecified fracture of first lumbar vertebra, subsequent encounter for fracture with routine healing: Secondary | ICD-10-CM | POA: Diagnosis not present

## 2019-05-31 DIAGNOSIS — S2239XD Fracture of one rib, unspecified side, subsequent encounter for fracture with routine healing: Secondary | ICD-10-CM | POA: Diagnosis not present

## 2019-05-31 DIAGNOSIS — F329 Major depressive disorder, single episode, unspecified: Secondary | ICD-10-CM | POA: Diagnosis not present

## 2019-05-31 DIAGNOSIS — F0390 Unspecified dementia without behavioral disturbance: Secondary | ICD-10-CM | POA: Diagnosis not present

## 2019-05-31 DIAGNOSIS — I10 Essential (primary) hypertension: Secondary | ICD-10-CM | POA: Diagnosis not present

## 2019-05-31 DIAGNOSIS — I480 Paroxysmal atrial fibrillation: Secondary | ICD-10-CM | POA: Diagnosis not present

## 2019-08-23 ENCOUNTER — Other Ambulatory Visit: Payer: Self-pay

## 2019-08-23 ENCOUNTER — Emergency Department (HOSPITAL_COMMUNITY): Payer: Medicare Other

## 2019-08-23 ENCOUNTER — Inpatient Hospital Stay (HOSPITAL_COMMUNITY)
Admission: EM | Admit: 2019-08-23 | Discharge: 2019-08-27 | DRG: 536 | Disposition: A | Discharge status: Skilled Nursing Facility | Payer: Medicare Other | Source: Skilled Nursing Facility | Attending: Internal Medicine | Admitting: Internal Medicine

## 2019-08-23 DIAGNOSIS — I10 Essential (primary) hypertension: Secondary | ICD-10-CM | POA: Diagnosis present

## 2019-08-23 DIAGNOSIS — S32402A Unspecified fracture of left acetabulum, initial encounter for closed fracture: Secondary | ICD-10-CM | POA: Diagnosis present

## 2019-08-23 DIAGNOSIS — S329XXD Fracture of unspecified parts of lumbosacral spine and pelvis, subsequent encounter for fracture with routine healing: Secondary | ICD-10-CM | POA: Diagnosis not present

## 2019-08-23 DIAGNOSIS — U071 COVID-19: Secondary | ICD-10-CM | POA: Diagnosis not present

## 2019-08-23 DIAGNOSIS — S32492A Other specified fracture of left acetabulum, initial encounter for closed fracture: Secondary | ICD-10-CM

## 2019-08-23 DIAGNOSIS — I480 Paroxysmal atrial fibrillation: Secondary | ICD-10-CM | POA: Diagnosis present

## 2019-08-23 DIAGNOSIS — Z20828 Contact with and (suspected) exposure to other viral communicable diseases: Secondary | ICD-10-CM | POA: Diagnosis present

## 2019-08-23 DIAGNOSIS — Z66 Do not resuscitate: Secondary | ICD-10-CM | POA: Diagnosis present

## 2019-08-23 DIAGNOSIS — R52 Pain, unspecified: Secondary | ICD-10-CM | POA: Diagnosis not present

## 2019-08-23 DIAGNOSIS — D509 Iron deficiency anemia, unspecified: Secondary | ICD-10-CM | POA: Diagnosis present

## 2019-08-23 DIAGNOSIS — W19XXXA Unspecified fall, initial encounter: Secondary | ICD-10-CM | POA: Diagnosis present

## 2019-08-23 DIAGNOSIS — R000 Tachycardia, unspecified: Secondary | ICD-10-CM | POA: Diagnosis not present

## 2019-08-23 DIAGNOSIS — S32502A Unspecified fracture of left pubis, initial encounter for closed fracture: Secondary | ICD-10-CM | POA: Diagnosis not present

## 2019-08-23 DIAGNOSIS — I4891 Unspecified atrial fibrillation: Secondary | ICD-10-CM | POA: Diagnosis not present

## 2019-08-23 DIAGNOSIS — Z881 Allergy status to other antibiotic agents status: Secondary | ICD-10-CM

## 2019-08-23 DIAGNOSIS — S199XXA Unspecified injury of neck, initial encounter: Secondary | ICD-10-CM | POA: Diagnosis not present

## 2019-08-23 DIAGNOSIS — S32452A Displaced transverse fracture of left acetabulum, initial encounter for closed fracture: Secondary | ICD-10-CM

## 2019-08-23 DIAGNOSIS — Z882 Allergy status to sulfonamides status: Secondary | ICD-10-CM | POA: Diagnosis not present

## 2019-08-23 DIAGNOSIS — Z7401 Bed confinement status: Secondary | ICD-10-CM | POA: Diagnosis not present

## 2019-08-23 DIAGNOSIS — S3210XA Unspecified fracture of sacrum, initial encounter for closed fracture: Secondary | ICD-10-CM | POA: Diagnosis present

## 2019-08-23 DIAGNOSIS — D72829 Elevated white blood cell count, unspecified: Secondary | ICD-10-CM | POA: Diagnosis not present

## 2019-08-23 DIAGNOSIS — S32592A Other specified fracture of left pubis, initial encounter for closed fracture: Secondary | ICD-10-CM | POA: Diagnosis not present

## 2019-08-23 DIAGNOSIS — Z7901 Long term (current) use of anticoagulants: Secondary | ICD-10-CM

## 2019-08-23 DIAGNOSIS — M810 Age-related osteoporosis without current pathological fracture: Secondary | ICD-10-CM | POA: Diagnosis present

## 2019-08-23 DIAGNOSIS — H409 Unspecified glaucoma: Secondary | ICD-10-CM | POA: Diagnosis present

## 2019-08-23 DIAGNOSIS — W19XXXD Unspecified fall, subsequent encounter: Secondary | ICD-10-CM

## 2019-08-23 DIAGNOSIS — S32512A Fracture of superior rim of left pubis, initial encounter for closed fracture: Secondary | ICD-10-CM | POA: Diagnosis not present

## 2019-08-23 DIAGNOSIS — E785 Hyperlipidemia, unspecified: Secondary | ICD-10-CM | POA: Diagnosis present

## 2019-08-23 DIAGNOSIS — S299XXA Unspecified injury of thorax, initial encounter: Secondary | ICD-10-CM | POA: Diagnosis not present

## 2019-08-23 DIAGNOSIS — M2550 Pain in unspecified joint: Secondary | ICD-10-CM | POA: Diagnosis not present

## 2019-08-23 DIAGNOSIS — R0902 Hypoxemia: Secondary | ICD-10-CM | POA: Diagnosis not present

## 2019-08-23 DIAGNOSIS — Y92129 Unspecified place in nursing home as the place of occurrence of the external cause: Secondary | ICD-10-CM | POA: Diagnosis not present

## 2019-08-23 DIAGNOSIS — R4182 Altered mental status, unspecified: Secondary | ICD-10-CM | POA: Diagnosis not present

## 2019-08-23 DIAGNOSIS — S3992XA Unspecified injury of lower back, initial encounter: Secondary | ICD-10-CM | POA: Diagnosis not present

## 2019-08-23 DIAGNOSIS — S0990XA Unspecified injury of head, initial encounter: Secondary | ICD-10-CM | POA: Diagnosis not present

## 2019-08-23 DIAGNOSIS — Z03818 Encounter for observation for suspected exposure to other biological agents ruled out: Secondary | ICD-10-CM | POA: Diagnosis not present

## 2019-08-23 DIAGNOSIS — S329XXA Fracture of unspecified parts of lumbosacral spine and pelvis, initial encounter for closed fracture: Secondary | ICD-10-CM | POA: Diagnosis not present

## 2019-08-23 DIAGNOSIS — F29 Unspecified psychosis not due to a substance or known physiological condition: Secondary | ICD-10-CM | POA: Diagnosis not present

## 2019-08-23 DIAGNOSIS — S32432A Displaced fracture of anterior column [iliopubic] of left acetabulum, initial encounter for closed fracture: Principal | ICD-10-CM | POA: Diagnosis present

## 2019-08-23 DIAGNOSIS — F331 Major depressive disorder, recurrent, moderate: Secondary | ICD-10-CM | POA: Diagnosis not present

## 2019-08-23 DIAGNOSIS — F0390 Unspecified dementia without behavioral disturbance: Secondary | ICD-10-CM | POA: Diagnosis present

## 2019-08-23 DIAGNOSIS — I499 Cardiac arrhythmia, unspecified: Secondary | ICD-10-CM | POA: Diagnosis not present

## 2019-08-23 DIAGNOSIS — M549 Dorsalgia, unspecified: Secondary | ICD-10-CM | POA: Diagnosis not present

## 2019-08-23 LAB — CBC WITH DIFFERENTIAL/PLATELET
Abs Immature Granulocytes: 0.18 10*3/uL — ABNORMAL HIGH (ref 0.00–0.07)
Basophils Absolute: 0 10*3/uL (ref 0.0–0.1)
Basophils Relative: 0 %
Eosinophils Absolute: 0.3 10*3/uL (ref 0.0–0.5)
Eosinophils Relative: 2 %
HCT: 36 % (ref 36.0–46.0)
Hemoglobin: 11.1 g/dL — ABNORMAL LOW (ref 12.0–15.0)
Immature Granulocytes: 1 %
Lymphocytes Relative: 15 %
Lymphs Abs: 2.3 10*3/uL (ref 0.7–4.0)
MCH: 28.8 pg (ref 26.0–34.0)
MCHC: 30.8 g/dL (ref 30.0–36.0)
MCV: 93.3 fL (ref 80.0–100.0)
Monocytes Absolute: 1.2 10*3/uL — ABNORMAL HIGH (ref 0.1–1.0)
Monocytes Relative: 8 %
Neutro Abs: 11.7 10*3/uL — ABNORMAL HIGH (ref 1.7–7.7)
Neutrophils Relative %: 74 %
Platelets: 415 10*3/uL — ABNORMAL HIGH (ref 150–400)
RBC: 3.86 MIL/uL — ABNORMAL LOW (ref 3.87–5.11)
RDW: 14.6 % (ref 11.5–15.5)
WBC: 15.8 10*3/uL — ABNORMAL HIGH (ref 4.0–10.5)
nRBC: 0 % (ref 0.0–0.2)

## 2019-08-23 LAB — BASIC METABOLIC PANEL
Anion gap: 12 (ref 5–15)
BUN: 24 mg/dL — ABNORMAL HIGH (ref 8–23)
CO2: 27 mmol/L (ref 22–32)
Calcium: 9.1 mg/dL (ref 8.9–10.3)
Chloride: 99 mmol/L (ref 98–111)
Creatinine, Ser: 1.25 mg/dL — ABNORMAL HIGH (ref 0.44–1.00)
GFR calc Af Amer: 44 mL/min — ABNORMAL LOW (ref >60)
GFR calc non Af Amer: 38 mL/min — ABNORMAL LOW (ref >60)
Glucose, Bld: 149 mg/dL — ABNORMAL HIGH (ref 70–99)
Potassium: 3.8 mmol/L (ref 3.5–5.1)
Sodium: 138 mmol/L (ref 135–145)

## 2019-08-23 LAB — TYPE AND SCREEN
ABO/RH(D): B POS
Antibody Screen: NEGATIVE

## 2019-08-23 LAB — PROTIME-INR
INR: 1.9 — ABNORMAL HIGH (ref 0.8–1.2)
Prothrombin Time: 21.1 seconds — ABNORMAL HIGH (ref 11.4–15.2)

## 2019-08-23 LAB — ABO/RH: ABO/RH(D): B POS

## 2019-08-23 MED ORDER — ACETAMINOPHEN 500 MG PO TABS: 1000.0000 mg | ORAL_TABLET | Freq: Four times a day (QID) | ORAL | Status: DC | PRN

## 2019-08-23 MED ORDER — FENTANYL CITRATE (PF) 100 MCG/2ML IJ SOLN
50.0000 ug | Freq: Once | INTRAMUSCULAR | Status: AC
  Administered 2019-08-23: 50 ug via INTRAVENOUS
  Filled 2019-08-23: qty 2

## 2019-08-23 MED ORDER — WARFARIN SODIUM 3 MG PO TABS
3.0000 mg | ORAL_TABLET | Freq: Once | ORAL | Status: AC
  Administered 2019-08-24: 3 mg via ORAL
  Filled 2019-08-23 (×2): qty 1

## 2019-08-23 MED ORDER — ONDANSETRON HCL 4 MG PO TABS: 4.0000 mg | ORAL_TABLET | Freq: Four times a day (QID) | ORAL | Status: DC | PRN

## 2019-08-23 MED ORDER — DIGOXIN 125 MCG PO TABS
0.1250 mg | ORAL_TABLET | ORAL | Status: DC
  Administered 2019-08-24 – 2019-08-26 (×2): 0.125 mg via ORAL
  Filled 2019-08-23 (×2): qty 1

## 2019-08-23 MED ORDER — KETOROLAC TROMETHAMINE 15 MG/ML IJ SOLN
15.0000 mg | Freq: Four times a day (QID) | INTRAMUSCULAR | Status: DC | PRN
  Administered 2019-08-24 – 2019-08-26 (×8): 15 mg via INTRAVENOUS
  Filled 2019-08-23 (×8): qty 1

## 2019-08-23 MED ORDER — CALCIUM CARBONATE 1250 (500 CA) MG PO TABS
500.0000 mg | ORAL_TABLET | Freq: Every day | ORAL | Status: DC
  Administered 2019-08-24 – 2019-08-27 (×4): 500 mg via ORAL
  Filled 2019-08-23 (×4): qty 1

## 2019-08-23 MED ORDER — TIMOLOL MALEATE 0.5 % OP SOLN
1.0000 [drp] | Freq: Every day | OPHTHALMIC | Status: DC
  Administered 2019-08-24 – 2019-08-27 (×4): 1 [drp] via OPHTHALMIC
  Filled 2019-08-23: qty 5

## 2019-08-23 MED ORDER — ACETAMINOPHEN 325 MG PO TABS
650.0000 mg | ORAL_TABLET | Freq: Once | ORAL | Status: DC
  Filled 2019-08-23: qty 2

## 2019-08-23 MED ORDER — DONEPEZIL HCL 5 MG PO TABS
5.0000 mg | ORAL_TABLET | Freq: Every day | ORAL | Status: DC
  Administered 2019-08-24 – 2019-08-26 (×4): 5 mg via ORAL
  Filled 2019-08-23 (×4): qty 1

## 2019-08-23 MED ORDER — ONDANSETRON HCL 4 MG/2ML IJ SOLN: 4.0000 mg | Freq: Four times a day (QID) | INTRAMUSCULAR | Status: DC | PRN

## 2019-08-23 MED ORDER — DULOXETINE HCL 60 MG PO CPEP
60.0000 mg | ORAL_CAPSULE | Freq: Every day | ORAL | Status: DC
  Administered 2019-08-24 – 2019-08-27 (×4): 60 mg via ORAL
  Filled 2019-08-23 (×4): qty 1

## 2019-08-23 MED ORDER — LATANOPROST 0.005 % OP SOLN
1.0000 [drp] | Freq: Every day | OPHTHALMIC | Status: DC
  Administered 2019-08-24 – 2019-08-26 (×4): 1 [drp] via OPHTHALMIC
  Filled 2019-08-23: qty 2.5

## 2019-08-23 MED ORDER — VITAMIN D 25 MCG (1000 UNIT) PO TABS
1000.0000 [IU] | ORAL_TABLET | Freq: Every day | ORAL | Status: DC
  Administered 2019-08-24 – 2019-08-27 (×4): 1000 [IU] via ORAL
  Filled 2019-08-23 (×4): qty 1

## 2019-08-23 MED ORDER — TRAZODONE HCL 50 MG PO TABS
50.0000 mg | ORAL_TABLET | Freq: Every day | ORAL | Status: DC
  Administered 2019-08-24 – 2019-08-26 (×4): 50 mg via ORAL
  Filled 2019-08-23 (×4): qty 1

## 2019-08-23 MED ORDER — ACETAMINOPHEN 650 MG RE SUPP: 650.0000 mg | Freq: Four times a day (QID) | RECTAL | Status: DC | PRN

## 2019-08-23 MED ORDER — ACETAMINOPHEN 325 MG PO TABS
650.0000 mg | ORAL_TABLET | Freq: Four times a day (QID) | ORAL | Status: DC | PRN
  Administered 2019-08-24 – 2019-08-27 (×11): 650 mg via ORAL
  Filled 2019-08-23 (×11): qty 2

## 2019-08-23 MED ORDER — SODIUM CHLORIDE 0.9 % IV SOLN
INTRAVENOUS | Status: DC
  Administered 2019-08-24 (×2): via INTRAVENOUS

## 2019-08-23 MED ORDER — WARFARIN - PHARMACIST DOSING INPATIENT: Freq: Every day | Status: DC

## 2019-08-23 MED ORDER — FUROSEMIDE 20 MG PO TABS
20.0000 mg | ORAL_TABLET | ORAL | Status: DC
  Administered 2019-08-25 – 2019-08-27 (×2): 20 mg via ORAL
  Filled 2019-08-23 (×2): qty 1

## 2019-08-23 NOTE — ED Provider Notes (Signed)
[facility] EMERGENCY DEPARTMENT Provider Note   CSN: [id number] Arrival date & time: 08/23/19  1730     History   Chief Complaint Chief Complaint  Patient presents with  . Fall    HPI Kendra Smith is a 85 y.o. female with past medical history of hyperlipidemia, hypertension, dementia, paroxysmal A. fib on Coumadin, presenting to the emergency department from [facility] memory care unit after a witnessed fall.  She was reportedly ambulating with her walker when she looked as though she sat down onto the ground.  Patient states she did not hit her head.  She complains of severe diffuse back pain and cannot localize it.  Pain is worse with any movement.  Per EMS, patient is at her baseline.    The history is provided by the patient and the EMS personnel.    Past Medical History:  Diagnosis Date  . Depression   . Glaucoma   . Hyperlipidemia   . Hypertension   . Osteoporosis     Patient Active Problem List   Diagnosis Date Noted  . Rib fracture-12 01/12/2019  . Fracture of transverse process of lumbar vertebra L1-L2 01/12/2019  . Dementia ([place]) 01/12/2019  . Fall 01/12/2019  . Leukocytosis 01/12/2019  . Depression   . Hypertension     Past Surgical History:  Procedure Laterality Date  . ABDOMINAL HYSTERECTOMY    . ANKLE ARTHROSCOPY    . CHOLECYSTECTOMY    . EYE SURGERY    . RECTOCELE REPAIR       OB History   No obstetric history on file.      Home Medications    Prior to Admission medications   Medication Sig Start Date End Date Taking? Authorizing Provider  acetaminophen (TYLENOL) 500 MG tablet Take 1,000 mg by mouth every 6 (six) hours as needed for mild pain.    [provider]  calcium carbonate (OSCAL) 1500 (600 Ca) MG TABS tablet Take 600 mg of elemental calcium by mouth daily with breakfast.    [provider]  cefUROXime (CEFTIN) 250 MG tablet Take 1 tablet (250 mg total) by mouth 2 (two) times daily with a  meal. 05/11/19   Pollina, Gwenyth Allegra, MD  cholecalciferol (VITAMIN D3) 25 MCG (1000 UT) tablet Take 1,000 Units by mouth daily.    [provider]  digoxin (LANOXIN) 0.125 MG tablet Take 0.125 mg by mouth every other day. Mon, Wed, Fri only    [provider]  donepezil (ARICEPT) 5 MG tablet Take 5 mg by mouth at bedtime. 10/15/18   [provider]  DULoxetine (CYMBALTA) 60 MG capsule Take 60 mg by mouth daily. 05/22/14   [provider]  furosemide (LASIX) 40 MG tablet Take 20 mg by mouth every other day.  11/06/18   [provider]  latanoprost (XALATAN) 0.005 % ophthalmic solution Place 1 drop into both eyes at bedtime.    [provider]  levofloxacin (LEVAQUIN) 500 MG tablet Take 1 tablet (500 mg total) by mouth daily. Patient not taking: Reported on 01/12/2019 06/29/14   Julianne Rice, MD  loperamide (IMODIUM A-D) 2 MG tablet Take 2 mg by mouth 4 (four) times daily as needed for diarrhea or loose stools.    [provider]  meclizine (ANTIVERT) 25 MG tablet Take 25 mg by mouth every 6 (six) hours as needed for dizziness.    [provider]  ondansetron (ZOFRAN) 8 MG tablet Take 8 mg by mouth every 8 (  eight) hours as needed for nausea or vomiting.    [provider]  polyethylene glycol (MIRALAX / GLYCOLAX) packet Take 17 g by mouth daily as needed for moderate constipation or severe constipation. Patient not taking: Reported on 05/11/2019 01/16/19   Dimple NanasAmin, Ankit Chirag, MD  senna-docusate (SENOKOT-S) 8.6-50 MG tablet Take 1 tablet by mouth at bedtime as needed for up to 30 doses for mild constipation. Patient not taking: Reported on 05/11/2019 01/16/19   Dimple NanasAmin, Ankit Chirag, MD  timolol (TIMOPTIC) 0.5 % ophthalmic solution Place 1 drop into both eyes daily. 06/15/14   [provider]  traMADol (ULTRAM) 50 MG tablet Take 50 mg by mouth every 8 (eight) hours as needed for moderate pain.    [provider]   traZODone (DESYREL) 50 MG tablet Take 50 mg by mouth at bedtime. 10/15/18   [provider]  warfarin (COUMADIN) 2 MG tablet Take 2-3 mg by mouth See admin instructions. Take 1 tablet on Tuesday, Wednesday, Thursday, Saturday and Sunday then take 1 and 1/2 tablets on Monday and Friday 04/13/14   [provider]    Family History No family history on file.  Social History Social History   Tobacco Use  . Smoking status: Never Smoker  . Smokeless tobacco: Never Used  Substance Use Topics  . Alcohol use: No  . Drug use: No     Allergies   Ciprofloxacin hcl, Effexor [venlafaxine], Gantrisin [sulfisoxazole], Levaquin [levofloxacin], and Sulfa antibiotics   Review of Systems Review of Systems  All other systems reviewed and are negative.    Physical Exam Updated Vital Signs BP (!) 118/52 (BP Location: Left Arm)   Pulse (!) 101   Temp 98.9 F (37.2 C) (Oral)   Resp 18   SpO2 94%   Physical Exam Vitals signs and nursing note reviewed.  Constitutional:      Appearance: She is well-developed.  HENT:     Head: Normocephalic and atraumatic.     Comments: No scalp hematoma or evidence of facial trauma Eyes:     Conjunctiva/sclera: Conjunctivae normal.  Neck:     Comments: C-collar in place. Cardiovascular:     Rate and Rhythm: Normal rate and regular rhythm.  Pulmonary:     Effort: Pulmonary effort is normal. No respiratory distress.     Breath sounds: Normal breath sounds.  Abdominal:     General: Bowel sounds are normal.     Palpations: Abdomen is soft.     Tenderness: There is no abdominal tenderness.  Musculoskeletal:     Comments: Left leg appears slightly shortened and patient has severe pain in left hip with movement of the left leg.  She is also tender in the left lateral hip.  Unable to roll patient to examine her back due to patient's pain at this time.  Skin:    General: Skin is warm.  Neurological:     Mental Status: She is alert.   Psychiatric:        Behavior: Behavior normal.      ED Treatments / Results  Labs (all labs ordered are listed, but only abnormal results are displayed) Labs Reviewed  BASIC METABOLIC PANEL  CBC WITH DIFFERENTIAL/PLATELET  PROTIME-INR    EKG None  Radiology No results found.  Procedures Procedures (including critical care time)  Medications Ordered in ED Medications  fentaNYL (SUBLIMAZE) injection 50 mcg (has no administration in time range)     Initial Impression / Assessment and Plan / ED Course  I have reviewed the triage vital signs and the nursing notes.  Pertinent labs & imaging results that were available during my care of the patient were reviewed by me and considered in my medical decision making (see chart for details).  Clinical Course as of Aug 22 2148  Tue Aug 23, 2019  [id number] 85 year old female from memory care unit after an unwitnessed fall while using her walker.  Complaining of severe back pain.  Anticoagulated.  Moving all extremities.  Will need CT imaging.   [MB]  2034 Patient reevaluated.  Reports pain is returning.  Discussed imaging results thus far.  She states she does not have any family she would like me to call.  Will re-dose pain medication   [JR]  2042 Ninfa Linden [phone] son-in-law husband to [name] (pt's daughter)   [JR]  2046 Discussed with Dr. August Saucer with ortho. Caryn Bee haddix   [JR]  2121 Dr. Mikeal Hawthorne accepting admission.   [JR]    Clinical Course User Index [JR] Sylena Lotter, [place] N, PA-C [MB] Terrilee Files, MD       Patient is a 85 year old female on Coumadin for A. fib, presenting from skilled nursing facility after mechanical fall while ambulating today with her walker.  She reportedly fell down onto her bottom.  She presents complaining of generalized pain to her back though is having significant pain with any movement of her left leg.  Left leg appears slightly shortened on exam. Pt is alert and answering questions  appropriately though is unable to localize her pain, at her baseline per EMS. Fentanyl ordered for pain, CT imaging of her head and spine. xrays of hip.  Imaging reveals acute comminuted left acetabular fracture with 2 fractures of the inferior pubic ramus. There is also minimal present on the left side of the pelvis without hematoma. Remainder of spine and head imaging is negative. Pt remains hemodynamically stable. Pain treated.  Discussed results with patient and patient's granddaughter, as I was unable to successfully contact her daughter. Pt's granddaughter provided contact information for her father as well, (listed above in ED course). She is updated on findings today and plan for admission. She reports pt is ambulatory at baseline and family would like to explore treatment options to maintain her quality of life.   Dr. August Saucer with ortho consulted- Dr. Jena Gauss to see pt in the morning. Pt can eat at this time but is to remain nonweightbearing. Dr. Mikeal Hawthorne accepting admission.  Final Clinical Impressions(s) / ED Diagnoses   Final diagnoses:  Fall at nursing home, initial encounter  Other closed fracture of left acetabulum, initial encounter [facility]  Closed fracture of ramus of left pubis, initial encounter [facility])    ED Discharge Orders    None       Rayven Rettig, [place] N, PA-C 08/23/19 2205    Terrilee Files, MD 08/24/19 1009

## 2019-08-23 NOTE — Progress Notes (Signed)
ANTICOAGULATION CONSULT NOTE - Initial Consult  Pharmacy Consult for Warfarin  Indication: atrial fibrillation  Allergies  Allergen Reactions  . Ciprofloxacin Hcl Other (See Comments)    On MAR  . Effexor [Venlafaxine] Other (See Comments)    On MAR  . Gantrisin [Sulfisoxazole] Other (See Comments)    On MAR  . Levaquin [Levofloxacin] Other (See Comments)    On MAR  . Sulfa Antibiotics Rash     Vital Signs: Temp: 98.9 F (37.2 C) (10/13 1739) Temp Source: Oral (10/13 1739) BP: 117/59 (10/13 2230) Pulse Rate: 103 (10/13 2230)  Labs: Recent Labs    08/23/19 1744 08/23/19 2139  HGB 11.1*  --   HCT 36.0  --   PLT 415*  --   LABPROT  --  21.1*  INR  --  1.9*  CREATININE 1.25*  --     CrCl cannot be calculated (Unknown ideal weight.).   Medical History: Past Medical History:  Diagnosis Date  . Depression   . Glaucoma   . Hyperlipidemia   . Hypertension   . Osteoporosis     Assessment: 85 y/o F from memory care facility with fall, CT head negative, on warfarin PTA for afib, continuing warfarin, INR sub-therapeutic at 1.9, Hgb 11.1  Goal of Therapy:  INR 2-3 Monitor platelets by anticoagulation protocol: Yes   Plan:  Warfarin 3 mg PO x 1 now  Daily PT/INR Monitor for bleeding  Narda Bonds, PharmD, BCPS Clinical Pharmacist Phone: [phone]

## 2019-08-23 NOTE — ED Notes (Signed)
50 mcg of fentanyl (per order) given to pt while she was in CT d/t her screaming (per CT tech).

## 2019-08-23 NOTE — ED Notes (Signed)
Daughter Lyn Henri called and wanted to leave number for Granddaughter Morey Hummingbird Long [phone] who can be reached if needed. [name] should still be the primary contact.

## 2019-08-23 NOTE — H&P (Signed)
History and Physical   Erendida Wrenn ZOX:[id number] DOB: 11-May-1926 DOA: 08/23/2019  Referring MD/NP/PA: Dr. Madilyn Hook  PCP: Patient, No Pcp Per   Outpatient Specialists: None  Patient coming from: Home  Chief Complaint: Fall  HPI: Kendra Smith is a 85 y.o. female with medical history significant of dementia, hypertension, paroxysmal atrial fibrillation on chronic anticoagulation, glaucoma, depression and hyperlipidemia who was brought in as secondary to a fall.  Patient was apparently walking around with her walker when she fell.  She felt weak in her legs and went down.  She did not hit her head.  She did not know if she tripped on anything.  She laid down on her left side and felt immediate pain.  She started having the pain on the left side and lower back.  She was brought into the ER where she was evaluated and found to have left acetabular fracture with some inferior pubic rami fractures and possible sacral fractures.  She is fully waited at the moment with no obvious loss of consciousness.  Orthopedics have been consulted and patient is being admitted to the hospital for treatment and evaluation.  Her pain is currently at 8 out of 10.  She has received multiple doses of morphine and is getting better.  She is being admitted with orthopedic follow-up.  ED Course: Temperature is 98.9 blood pressure 119/70 pulse 106 respiratory of 20 oxygen sat 87% on room air currently 92% on 2 L.  White count 15.8 hemoglobin 11.1 platelets 415.  Sodium 138 potassium 3.8 chloride 99 CO2 27 BUN 24 and creatinine of 1.25.  INR is 1.9 and glucose 149.  Full x-rays of the neck, pelvis chest done.  Most notable finding is acute comminuted left acetabular fracture, acute Camitta left inferior pubic ramus fracture and possible left superior pubic ramus fracture.  Also acute left sacral fracture near the inferior SI joint.  Orthopedic consulted and patient is being admitted for treatment.  Urinalysis showed positive  nitrite but no symptoms of dysuria.  Review of Systems: As per HPI otherwise 10 point review of systems negative.    Past Medical History:  Diagnosis Date   Depression    Glaucoma    Hyperlipidemia    Hypertension    Osteoporosis     Past Surgical History:  Procedure Laterality Date   ABDOMINAL HYSTERECTOMY     ANKLE ARTHROSCOPY     CHOLECYSTECTOMY     EYE SURGERY     RECTOCELE REPAIR       reports that she has never smoked. She has never used smokeless tobacco. She reports that she does not drink alcohol or use drugs.  Allergies  Allergen Reactions   Ciprofloxacin Hcl Other (See Comments)    On MAR   Effexor [Venlafaxine] Other (See Comments)    On MAR   Gantrisin [Sulfisoxazole] Other (See Comments)    On MAR   Levaquin [Levofloxacin] Other (See Comments)    On MAR   Sulfa Antibiotics Rash    No family history on file.   Prior to Admission medications   Medication Sig Start Date End Date Taking? Authorizing Provider  calcium carbonate (OSCAL) 1500 (600 Ca) MG TABS tablet Take 600 mg of elemental calcium by mouth daily with breakfast.   Yes [provider]  cholecalciferol (VITAMIN D3) 25 MCG (1000 UT) tablet Take 1,000 Units by mouth daily.   Yes [provider]  digoxin (LANOXIN) 0.125 MG tablet Take 0.125 mg by mouth every other day. [place],  Wed, Fri only   Yes [provider]  donepezil (ARICEPT) 5 MG tablet Take 5 mg by mouth at bedtime. 10/15/18  Yes [provider]  DULoxetine (CYMBALTA) 60 MG capsule Take 60 mg by mouth daily. 05/22/14  Yes [provider]  furosemide (LASIX) 40 MG tablet Take 20 mg by mouth every other day.  11/06/18  Yes [provider]  latanoprost (XALATAN) 0.005 % ophthalmic solution Place 1 drop into both eyes at bedtime.   Yes [provider]  timolol (TIMOPTIC) 0.5 % ophthalmic solution Place 1 drop into both eyes daily. 06/15/14  Yes [provider]    traZODone (DESYREL) 50 MG tablet Take 50 mg by mouth at bedtime. 10/15/18  Yes [provider]  warfarin (COUMADIN) 2 MG tablet Take 2-3 mg by mouth See admin instructions. Take one tablet by mouth daily except on Friday take 1 and 1/2 tablet by mouth 04/13/14  Yes [provider]  acetaminophen (TYLENOL) 500 MG tablet Take 1,000 mg by mouth every 6 (six) hours as needed for mild pain.    [provider]    Physical Exam: Vitals:   08/23/19 2145 08/23/19 2200 08/23/19 2215 08/23/19 2230  BP: 117/64 118/66 109/69 (!) 117/59  Pulse: (!) 106 (!) 102 (!) 103 (!) 103  Resp: Temp:      TempSrc:      SpO2: (!) 87% 90% 90% 92%      Constitutional: Frail,Mild distress Vitals:   08/23/19 2145 08/23/19 2200 08/23/19 2215 08/23/19 2230  BP: 117/64 118/66 109/69 (!) 117/59  Pulse: (!) 106 (!) 102 (!) 103 (!) 103  Resp: Temp:      TempSrc:      SpO2: (!) 87% 90% 90% 92%   Eyes: PERRL, lids and conjunctivae normal ENMT: Mucous membranes are dry. Posterior pharynx clear of any exudate or lesions.Normal dentition.  Neck: normal, supple, no masses, no thyromegaly Respiratory: clear to auscultation bilaterally, no wheezing, no crackles. Normal respiratory effort. No accessory muscle use.  Cardiovascular: Sinus tachycardia, no murmurs / rubs / gallops. No extremity edema. 2+ pedal pulses. No carotid bruits.  Abdomen: no tenderness, no masses palpated. No hepatosplenomegaly. Bowel sounds positive.  Musculoskeletal: no clubbing / cyanosis. No joint deformity upper and lower extremities.  Decreased range of motion on the left, tenderness, bruises no contractures. Normal muscle tone.  Skin: no rashes, lesions, ulcers. No induration Neurologic: CN 2-12 grossly intact. Sensation intact, DTR normal. Strength 5/5 in all 4.  Psychiatric: Normal judgment and insight. Alert and oriented x 3. Normal mood.     Labs on Admission: I have personally reviewed  following labs and imaging studies  CBC: Recent Labs  Lab 08/23/19 1744  WBC 15.8*  NEUTROABS 11.7*  HGB 11.1*  HCT 36.0  MCV 93.3  PLT 415*   Basic Metabolic Panel: Recent Labs  Lab 08/23/19 1744  NA 138  K 3.8  CL 99  CO2 27  GLUCOSE 149*  BUN 24*  CREATININE 1.25*  CALCIUM 9.1   GFR: CrCl cannot be calculated (Unknown ideal weight.). Liver Function Tests: No results for input(s): AST, ALT, ALKPHOS, BILITOT, PROT, ALBUMIN in the last 168 hours. No results for input(s): LIPASE, AMYLASE in the last 168 hours. No results for input(s): AMMONIA in the last 168 hours. Coagulation Profile: Recent Labs  Lab 08/23/19 2139  INR 1.9*   Cardiac Enzymes: No results for input(s): CKTOTAL, CKMB, CKMBINDEX, TROPONINI  in the last 168 hours. BNP (last 3 results) No results for input(s): PROBNP in the last 8760 hours. HbA1C: No results for input(s): HGBA1C in the last 72 hours. CBG: No results for input(s): GLUCAP in the last 168 hours. Lipid Profile: No results for input(s): CHOL, HDL, LDLCALC, TRIG, CHOLHDL, LDLDIRECT in the last 72 hours. Thyroid Function Tests: No results for input(s): TSH, T4TOTAL, FREET4, T3FREE, THYROIDAB in the last 72 hours. Anemia Panel: No results for input(s): VITAMINB12, FOLATE, FERRITIN, TIBC, IRON, RETICCTPCT in the last 72 hours. Urine analysis:    Component Value Date/Time   COLORURINE YELLOW 05/11/2019 0244   APPEARANCEUR HAZY (A) 05/11/2019 0244   LABSPEC 1.017 05/11/2019 0244   PHURINE 5.0 05/11/2019 0244   GLUCOSEU NEGATIVE 05/11/2019 0244   HGBUR NEGATIVE 05/11/2019 0244   BILIRUBINUR NEGATIVE 05/11/2019 0244   KETONESUR NEGATIVE 05/11/2019 0244   PROTEINUR NEGATIVE 05/11/2019 0244   UROBILINOGEN 1.0 06/22/2011 1224   NITRITE POSITIVE (A) 05/11/2019 0244   LEUKOCYTESUR LARGE (A) 05/11/2019 0244   Sepsis Labs: @LABRCNTIP (procalcitonin:4,lacticidven:4) )No results found for this or any previous visit (from the past 240  hour(s)).   Radiological Exams on Admission: Dg Chest 1 View  Result Date: 08/23/2019 CLINICAL DATA:  Fall with hip fracture EXAM: CHEST  1 VIEW COMPARISON:  06/29/2014, CT 01/12/2019 FINDINGS: Mild a pickle pleural and parenchymal scarring. Mild scarring at the left base. No acute airspace disease or effusion. Normal cardiomediastinal silhouette with aortic atherosclerosis. No pneumothorax. IMPRESSION: No active disease. Electronically Signed   By: Jasmine PangKim  Fujinaga M.D.   On: 08/23/2019 19:13   Ct Head Wo Contrast  Result Date: 08/23/2019 CLINICAL DATA:  85 year old female with history of trauma from a fall. EXAM: CT HEAD WITHOUT CONTRAST CT CERVICAL SPINE WITHOUT CONTRAST TECHNIQUE: Multidetector CT imaging of the head and cervical spine was performed following the standard protocol without intravenous contrast. Multiplanar CT image reconstructions of the cervical spine were also generated. COMPARISON:  Head CT 05/11/2019. FINDINGS: CT HEAD FINDINGS Brain: Physiologic calcifications in the right basal ganglia. Moderate cerebral atrophy. Patchy and confluent areas of decreased attenuation are noted throughout the deep and periventricular white matter of the cerebral hemispheres bilaterally, compatible with chronic microvascular ischemic disease. No evidence of acute infarction, hemorrhage, hydrocephalus, extra-axial collection or mass lesion/mass effect. Vascular: No hyperdense vessel or unexpected calcification. Skull: Normal. Negative for fracture or focal lesion. Sinuses/Orbits: No acute finding. Other: None. CT CERVICAL SPINE FINDINGS Comment: Study is limited by considerable patient motion. Alignment: Normal. Skull base and vertebrae: No acute fracture. No primary bone lesion or focal pathologic process. Soft tissues and spinal canal: No prevertebral fluid or swelling. No visible canal hematoma. Disc levels: Multilevel degenerative disc disease, most pronounced at C5-C6. Mild multilevel facet  arthropathy. Upper chest: Calcified area of pleuroparenchymal thickening in the apex of the left hemithorax, most compatible with chronic post infectious or inflammatory scarring. Other: None. IMPRESSION: 1. No evidence of significant acute traumatic injury to the skull, brain or cervical spine. 2. Moderate cerebral atrophy with extensive chronic microvascular ischemic changes in the cerebral white matter, as above. 3. Mild multilevel degenerative disc disease and cervical spondylosis, as above. Electronically Signed   By: Trudie Reedaniel  Entrikin M.D.   On: 08/23/2019 19:50   Ct Cervical Spine Wo Contrast  Result Date: 08/23/2019 CLINICAL DATA:  85 year old female with history of trauma from a fall. EXAM: CT HEAD WITHOUT CONTRAST CT CERVICAL SPINE WITHOUT CONTRAST TECHNIQUE: Multidetector CT imaging of the head and cervical spine was  performed following the standard protocol without intravenous contrast. Multiplanar CT image reconstructions of the cervical spine were also generated. COMPARISON:  Head CT 05/11/2019. FINDINGS: CT HEAD FINDINGS Brain: Physiologic calcifications in the right basal ganglia. Moderate cerebral atrophy. Patchy and confluent areas of decreased attenuation are noted throughout the deep and periventricular white matter of the cerebral hemispheres bilaterally, compatible with chronic microvascular ischemic disease. No evidence of acute infarction, hemorrhage, hydrocephalus, extra-axial collection or mass lesion/mass effect. Vascular: No hyperdense vessel or unexpected calcification. Skull: Normal. Negative for fracture or focal lesion. Sinuses/Orbits: No acute finding. Other: None. CT CERVICAL SPINE FINDINGS Comment: Study is limited by considerable patient motion. Alignment: Normal. Skull base and vertebrae: No acute fracture. No primary bone lesion or focal pathologic process. Soft tissues and spinal canal: No prevertebral fluid or swelling. No visible canal hematoma. Disc levels: Multilevel  degenerative disc disease, most pronounced at C5-C6. Mild multilevel facet arthropathy. Upper chest: Calcified area of pleuroparenchymal thickening in the apex of the left hemithorax, most compatible with chronic post infectious or inflammatory scarring. Other: None. IMPRESSION: 1. No evidence of significant acute traumatic injury to the skull, brain or cervical spine. 2. Moderate cerebral atrophy with extensive chronic microvascular ischemic changes in the cerebral white matter, as above. 3. Mild multilevel degenerative disc disease and cervical spondylosis, as above. Electronically Signed   By: Trudie Reed M.D.   On: 08/23/2019 19:50   Ct Thoracic Spine Wo Contrast  Result Date: 08/23/2019 CLINICAL DATA:  85 year old female status post fall with back pain. EXAM: CT THORACIC SPINE WITHOUT CONTRAST TECHNIQUE: Multidetector CT images of the thoracic were obtained using the standard protocol without intravenous contrast. COMPARISON:  Lumbar spine CT today reported separately. FINDINGS: Limited cervical spine imaging: Visible lower cervical vertebrae appear grossly intact. Cervicothoracic junction alignment is within normal limits. Thoracic spine segmentation:  Normal. Alignment: Mildly exaggerated thoracic kyphosis. No spondylolisthesis. Vertebrae: Osteopenia. Benign bone island in the posterior right T3 body suspected. No acute thoracic vertebral fracture identified by CT. Partially visible chronic fracture of the posterior left 12th rib on series 5, image 160. Partially visible chronic sternal fracture on sagittal image 29. Paraspinal and other soft tissues: Calcified aortic atherosclerosis. Calcified coronary artery atherosclerosis. Vascular patency is not evaluated in the absence of IV contrast. No pericardial or pleural effusion. Mild pulmonary atelectasis. Surgically absent gallbladder. Otherwise negative visible noncontrast upper abdominal viscera. Paraspinal soft tissues are within normal limits.  Disc levels: Capacious thoracic spinal canal. No age advanced thoracic spine degeneration identified. IMPRESSION: 1. Osteopenia. No acute traumatic injury identified in the thoracic spine. Thoracic MRI without contrast or Nuclear Medicine Whole-body Bone Scan would be most sensitive for detection of occult compression fracture. 2. Partially visible chronic fractures of the left 12th rib and the sternum. 3.  Aortic Atherosclerosis (ICD10-I70.0). Electronically Signed   By: Odessa Fleming M.D.   On: 08/23/2019 19:49   Ct Lumbar Spine Wo Contrast  Result Date: 08/23/2019 CLINICAL DATA:  Fall.  Anticoagulated.  Diffuse back pain. EXAM: CT LUMBAR SPINE WITHOUT CONTRAST TECHNIQUE: Multidetector CT imaging of the lumbar spine was performed without intravenous contrast administration. Multiplanar CT image reconstructions were also generated. COMPARISON:  None. FINDINGS: Segmentation: Lumbar type vertebral bodies. Alignment: 2 mm degenerative anterolisthesis L3-4 and L4-5. Vertebrae: No fracture or primary bone lesion. Paraspinal and other soft tissues: Negative except for aortic atherosclerosis and right renal cysts. Disc levels: No significant finding at L2-3 or above. L3-4: Facet osteoarthritis with 2 mm of anterolisthesis. Bulging of the  disc. No compressive stenosis. L4-5: Facet osteoarthritis with 2 mm of anterolisthesis. Bulging of the disc. No compressive stenosis. L5-S1: Disc space narrowing with osteophytes more prominent on the right. Bilateral facet osteoarthritis. No compressive canal stenosis. Foraminal narrowing on the right with some potential to affect the right L5 nerve. IMPRESSION: No acute or traumatic finding. Chronic degenerative changes in the lower lumbar region as outlined above. Electronically Signed   By: Paulina Fusi M.D.   On: 08/23/2019 19:26   Ct Pelvis Wo Contrast  Result Date: 08/23/2019 CLINICAL DATA:  Acute pelvic fractures secondary to a fall. EXAM: CT PELVIS WITHOUT CONTRAST TECHNIQUE:  Multidetector CT imaging of the pelvis was performed following the standard protocol without intravenous contrast. COMPARISON:  Radiographs dated 08/23/2019 FINDINGS: Musculoskeletal: There is a comminuted impacted fracture of the left acetabulum involving the anterior and posterior columns. The fracture fragments extend medially into the pelvis. There are 2 fractures of the left inferior pubic ramus. The sacrum and right hemipelvis are intact. Proximal femurs are intact. No dislocation. There is only minimal hemorrhage in the left side of the pelvis without a defined hematoma. Urinary Tract: The bladder is slightly deviated to the right by the hemorrhage but otherwise appears normal. Bowel:  Extensive sigmoid diverticulosis. Vascular/Lymphatic: No acute abnormality. Aortic atherosclerosis. Reproductive:  Hysterectomy. No adnexal masses. Other:  None. IMPRESSION: 1. Comminuted impacted fracture of the left acetabulum involving the anterior and posterior columns. 2. Two fractures of the left inferior pubic ramus. 3. Minimal hemorrhage in the left side of the pelvis without a defined hematoma. Aortic Atherosclerosis (ICD10-I70.0). Electronically Signed   By: Francene Boyers M.D.   On: 08/23/2019 20:08   Dg Hip Unilat With Pelvis 2-3 Views Left  Result Date: 08/23/2019 CLINICAL DATA:  Fall EXAM: DG HIP (WITH OR WITHOUT PELVIS) 2-3V LEFT COMPARISON:  CT 01/12/2019 FINDINGS: Bones appear osteopenic. SI joints are non widened. The pubic symphysis is intact. Right femoral head projects in joint. Moderate degenerative change. Acute comminuted left acetabular fracture with mild protrusio deformity. Acute mildly comminuted fracture left inferior pubic ramus. Probable acute fracture of the left superior pubic ramus near its junction with the acetabulum. Possible left sacral fracture near the inferior SI joint. IMPRESSION: 1. Acute comminuted left acetabular fracture 2. Acute comminuted left inferior pubic ramus fracture  with probable fracture of the left superior pubic ramus. 3. Possible acute left sacral fracture near the inferior SI joint Electronically Signed   By: Jasmine Pang M.D.   On: 08/23/2019 19:12    EKG: Independently reviewed.  It shows sinus tachycardia with a rate of 106, evidence of LVH or multiple conduction abnormalities but unchanged from previous  Assessment/Plan Principal Problem:   Fall Active Problems:   Hypertension   Dementia (HCC)   Leukocytosis   Left acetabular fracture (HCC)   Pelvic rami fracture (HCC)   PAF (paroxysmal atrial fibrillation) (HCC)     #1 status post fall: Most likely secondary to gait abnormalities.  Could be mechanical but with patient's known history of atrial fibrillation and other cardiac causes could be arrhythmias.  Patient will be placed on telemetry.  Echocardiogram.  PT and OT consultation.  #2 left acetabular and pelvic rami fractures: Secondary to fall.  Orthopedic consulted.  Continue pain management and care according to orthopedics.  #3 dementia: Appears at baseline.  Continue home regimen  #4 hypertension: Continue with home regimen  #5 atrial fibrillation: Paroxysmal in nature.  Currently in sinus rhythm.  Continue warfarin and rate  control   DVT prophylaxis: Warfarin Code Status: DNR Family Communication: Family over the phone Disposition Plan: To be determined Consults called: Dr. Doreatha Martin orthopedic surgery Admission status: Inpatient  Severity of Illness: The appropriate patient status for this patient is INPATIENT. Inpatient status is judged to be reasonable and necessary in order to provide the required intensity of service to ensure the patient's safety. The patient's presenting symptoms, physical exam findings, and initial radiographic and laboratory data in the context of their chronic comorbidities is felt to place them at high risk for further clinical deterioration. Furthermore, it is not anticipated that the patient will  be medically stable for discharge from the hospital within 2 midnights of admission. The following factors support the patient status of inpatient.   " The patient's presenting symptoms include fall with left hip pain. " The worrisome physical exam findings include tenderness and decreased range of motion of the left hip. " The initial radiographic and laboratory data are worrisome because of x-ray showing comminuted fractures of the last left acetabular. " The chronic co-morbidities include dementia.   * I certify that at the point of admission it is my clinical judgment that the patient will require inpatient hospital care spanning beyond 2 midnights from the point of admission due to high intensity of service, high risk for further deterioration and high frequency of surveillance required.Barbette Merino MD [facility] Pager [phone]  If 7PM-7AM, please contact night-coverage [email or web address] Password [email or web address]  08/23/2019, 10:53 PM

## 2019-08-23 NOTE — ED Notes (Signed)
ED TO INPATIENT HANDOFF REPORT  ED Nurse Name and Phone #: Jeannett Senior [phone]  M Name/Age/Gender Kendra Smith 85 y.o. female Room/Bed: 020C/020C  Code Status   Code Status: Prior  Home/SNF/Other Nursing Home Patient oriented to: self, place and situation Is this baseline? Yes   Triage Complete: Triage complete  Chief Complaint fall  Triage Note Pt bib [facility] with compliants of witnessed fall. Pt from [facility] memory care. Pt was walking with walker and "fell" onto her bottom. Pt states her pain is 10/10. Pt on coumadin. According to staff pt never left go of walker. Possible 'sat down" without a chair being there.  Pt alert and oriented X3   Allergies Allergies  Allergen Reactions  . Ciprofloxacin Hcl Other (See Comments)    On MAR  . Effexor [Venlafaxine] Other (See Comments)    On MAR  . Gantrisin [Sulfisoxazole] Other (See Comments)    On MAR  . Levaquin [Levofloxacin] Other (See Comments)    On MAR  . Sulfa Antibiotics Rash    Level of Care/Admitting Diagnosis ED Disposition    ED Disposition Condition Comment   Admit  Hospital Area: [facility] [[id number]]  Level of Care: Med-Surg [16]  Covid Evaluation: Asymptomatic Screening Protocol (No Symptoms)  Diagnosis: Fall [[id number]]  Admitting Physician: Rometta Emery [[id number]]  Attending Physician: Rometta Emery [[id number]]  Estimated length of stay: past midnight tomorrow  Certification:: I certify this patient will need inpatient services for at least 2 midnights  PT Class (Do Not Modify): Inpatient [101]  PT Acc Code (Do Not Modify): Private [1]       B Medical/Surgery History Past Medical History:  Diagnosis Date  . Depression   . Glaucoma   . Hyperlipidemia   . Hypertension   . Osteoporosis    Past Surgical History:  Procedure Laterality Date  . ABDOMINAL HYSTERECTOMY    . ANKLE ARTHROSCOPY    . CHOLECYSTECTOMY    . EYE SURGERY    . RECTOCELE REPAIR       A IV  Location/Drains/Wounds Patient Lines/Drains/Airways Status   Active Line/Drains/Airways    Name:   Placement date:   Placement time:   Site:   Days:   Peripheral IV 08/23/19 Right Antecubital   08/23/19    1802    Antecubital   less than 1          Intake/Output Last 24 hours No intake or output data in the 24 hours ending 08/23/19 2232  Labs/Imaging Results for orders placed or performed during the hospital encounter of 08/23/19 (from the past 48 hour(s))  Basic metabolic panel     Status: Abnormal   Collection Time: 08/23/19  5:44 PM  Result Value Ref Range   Sodium 138 135 - 145 mmol/L   Potassium 3.8 3.5 - 5.1 mmol/L   Chloride 99 98 - 111 mmol/L   CO2 27 22 - 32 mmol/L   Glucose, Bld 149 (H) 70 - 99 mg/dL   BUN 24 (H) 8 - 23 mg/dL   Creatinine, Ser 0.94 (H) 0.44 - 1.00 mg/dL   Calcium 9.1 8.9 - 70.9 mg/dL   GFR calc non Af Amer 38 (L) >60 mL/min   GFR calc Af Amer 44 (L) >60 mL/min   Anion gap 12 5 - 15    Comment: Performed at [facility], [address] [place], [place] [address]  CBC with Differential     Status: Abnormal   Collection Time:  08/23/19  5:44 PM  Result Value Ref Range   WBC 15.8 (H) 4.0 - 10.5 K/uL   RBC 3.86 (L) 3.87 - 5.11 MIL/uL   Hemoglobin 11.1 (L) 12.0 - 15.0 g/dL   HCT 16.1 09.6 - 04.5 %   MCV 93.3 80.0 - 100.0 fL   MCH 28.8 26.0 - 34.0 pg   MCHC 30.8 30.0 - 36.0 g/dL   RDW 40.9 81.1 - 91.4 %   Platelets 415 (H) 150 - 400 K/uL   nRBC 0.0 0.0 - 0.2 %   Neutrophils Relative % 74 %   Neutro Abs 11.7 (H) 1.7 - 7.7 K/uL   Lymphocytes Relative 15 %   Lymphs Abs 2.3 0.7 - 4.0 K/uL   Monocytes Relative 8 %   Monocytes Absolute 1.2 (H) 0.1 - 1.0 K/uL   Eosinophils Relative 2 %   Eosinophils Absolute 0.3 0.0 - 0.5 K/uL   Basophils Relative 0 %   Basophils Absolute 0.0 0.0 - 0.1 K/uL   Immature Granulocytes 1 %   Abs Immature Granulocytes 0.18 (H) 0.00 - 0.07 K/uL    Comment: Performed at [facility], [address],  [place], [place] [address]  Type and screen [facility]     Status: None (Preliminary result)   Collection Time: 08/23/19  8:39 PM  Result Value Ref Range   ABO/RH(D) PENDING    Antibody Screen PENDING    Sample Expiration      08/26/2019,2359 Performed at [facility], [address], [place], [place] [address]   Protime-INR     Status: Abnormal   Collection Time: 08/23/19  9:39 PM  Result Value Ref Range   Prothrombin Time 21.1 (H) 11.4 - 15.2 seconds   INR 1.9 (H) 0.8 - 1.2    Comment: (NOTE) INR goal varies based on device and disease states. Performed at [facility], [address], [place], [place] [address]    Dg Chest 1 View  Result Date: 08/23/2019 CLINICAL DATA:  Fall with hip fracture EXAM: CHEST  1 VIEW COMPARISON:  06/29/2014, CT 01/12/2019 FINDINGS: Mild a pickle pleural and parenchymal scarring. Mild scarring at the left base. No acute airspace disease or effusion. Normal cardiomediastinal silhouette with aortic atherosclerosis. No pneumothorax. IMPRESSION: No active disease. Electronically Signed   By: Jasmine Pang M.D.   On: 08/23/2019 19:13   Ct Head Wo Contrast  Result Date: 08/23/2019 CLINICAL DATA:  85 year old female with history of trauma from a fall. EXAM: CT HEAD WITHOUT CONTRAST CT CERVICAL SPINE WITHOUT CONTRAST TECHNIQUE: Multidetector CT imaging of the head and cervical spine was performed following the standard protocol without intravenous contrast. Multiplanar CT image reconstructions of the cervical spine were also generated. COMPARISON:  Head CT 05/11/2019. FINDINGS: CT HEAD FINDINGS Brain: Physiologic calcifications in the right basal ganglia. Moderate cerebral atrophy. Patchy and confluent areas of decreased attenuation are noted throughout the deep and periventricular white matter of the cerebral hemispheres bilaterally, compatible with chronic microvascular ischemic disease. No evidence of acute infarction, hemorrhage,  hydrocephalus, extra-axial collection or mass lesion/mass effect. Vascular: No hyperdense vessel or unexpected calcification. Skull: Normal. Negative for fracture or focal lesion. Sinuses/Orbits: No acute finding. Other: None. CT CERVICAL SPINE FINDINGS Comment: Study is limited by considerable patient motion. Alignment: Normal. Skull base and vertebrae: No acute fracture. No primary bone lesion or focal pathologic process. Soft tissues and spinal canal: No prevertebral fluid or swelling. No visible canal hematoma. Disc levels: Multilevel  degenerative disc disease, most pronounced at C5-C6. Mild multilevel facet arthropathy. Upper chest: Calcified area of pleuroparenchymal thickening in the apex of the left hemithorax, most compatible with chronic post infectious or inflammatory scarring. Other: None. IMPRESSION: 1. No evidence of significant acute traumatic injury to the skull, brain or cervical spine. 2. Moderate cerebral atrophy with extensive chronic microvascular ischemic changes in the cerebral white matter, as above. 3. Mild multilevel degenerative disc disease and cervical spondylosis, as above. Electronically Signed   By: Trudie Reed M.D.   On: 08/23/2019 19:50   Ct Cervical Spine Wo Contrast  Result Date: 08/23/2019 CLINICAL DATA:  85 year old female with history of trauma from a fall. EXAM: CT HEAD WITHOUT CONTRAST CT CERVICAL SPINE WITHOUT CONTRAST TECHNIQUE: Multidetector CT imaging of the head and cervical spine was performed following the standard protocol without intravenous contrast. Multiplanar CT image reconstructions of the cervical spine were also generated. COMPARISON:  Head CT 05/11/2019. FINDINGS: CT HEAD FINDINGS Brain: Physiologic calcifications in the right basal ganglia. Moderate cerebral atrophy. Patchy and confluent areas of decreased attenuation are noted throughout the deep and periventricular white matter of the cerebral hemispheres bilaterally, compatible with chronic  microvascular ischemic disease. No evidence of acute infarction, hemorrhage, hydrocephalus, extra-axial collection or mass lesion/mass effect. Vascular: No hyperdense vessel or unexpected calcification. Skull: Normal. Negative for fracture or focal lesion. Sinuses/Orbits: No acute finding. Other: None. CT CERVICAL SPINE FINDINGS Comment: Study is limited by considerable patient motion. Alignment: Normal. Skull base and vertebrae: No acute fracture. No primary bone lesion or focal pathologic process. Soft tissues and spinal canal: No prevertebral fluid or swelling. No visible canal hematoma. Disc levels: Multilevel degenerative disc disease, most pronounced at C5-C6. Mild multilevel facet arthropathy. Upper chest: Calcified area of pleuroparenchymal thickening in the apex of the left hemithorax, most compatible with chronic post infectious or inflammatory scarring. Other: None. IMPRESSION: 1. No evidence of significant acute traumatic injury to the skull, brain or cervical spine. 2. Moderate cerebral atrophy with extensive chronic microvascular ischemic changes in the cerebral white matter, as above. 3. Mild multilevel degenerative disc disease and cervical spondylosis, as above. Electronically Signed   By: Trudie Reed M.D.   On: 08/23/2019 19:50   Ct Thoracic Spine Wo Contrast  Result Date: 08/23/2019 CLINICAL DATA:  85 year old female status post fall with back pain. EXAM: CT THORACIC SPINE WITHOUT CONTRAST TECHNIQUE: Multidetector CT images of the thoracic were obtained using the standard protocol without intravenous contrast. COMPARISON:  Lumbar spine CT today reported separately. FINDINGS: Limited cervical spine imaging: Visible lower cervical vertebrae appear grossly intact. Cervicothoracic junction alignment is within normal limits. Thoracic spine segmentation:  Normal. Alignment: Mildly exaggerated thoracic kyphosis. No spondylolisthesis. Vertebrae: Osteopenia. Benign bone island in the posterior  right T3 body suspected. No acute thoracic vertebral fracture identified by CT. Partially visible chronic fracture of the posterior left 12th rib on series 5, image 160. Partially visible chronic sternal fracture on sagittal image 29. Paraspinal and other soft tissues: Calcified aortic atherosclerosis. Calcified coronary artery atherosclerosis. Vascular patency is not evaluated in the absence of IV contrast. No pericardial or pleural effusion. Mild pulmonary atelectasis. Surgically absent gallbladder. Otherwise negative visible noncontrast upper abdominal viscera. Paraspinal soft tissues are within normal limits. Disc levels: Capacious thoracic spinal canal. No age advanced thoracic spine degeneration identified. IMPRESSION: 1. Osteopenia. No acute traumatic injury identified in the thoracic spine. Thoracic MRI without contrast or Nuclear Medicine Whole-body Bone Scan would be most sensitive for detection of occult compression fracture.  2. Partially visible chronic fractures of the left 12th rib and the sternum. 3.  Aortic Atherosclerosis (ICD10-I70.0). Electronically Signed   By: Genevie Ann M.D.   On: 08/23/2019 19:49   Ct Lumbar Spine Wo Contrast  Result Date: 08/23/2019 CLINICAL DATA:  Fall.  Anticoagulated.  Diffuse back pain. EXAM: CT LUMBAR SPINE WITHOUT CONTRAST TECHNIQUE: Multidetector CT imaging of the lumbar spine was performed without intravenous contrast administration. Multiplanar CT image reconstructions were also generated. COMPARISON:  None. FINDINGS: Segmentation: Lumbar type vertebral bodies. Alignment: 2 mm degenerative anterolisthesis L3-4 and L4-5. Vertebrae: No fracture or primary bone lesion. Paraspinal and other soft tissues: Negative except for aortic atherosclerosis and right renal cysts. Disc levels: No significant finding at L2-3 or above. L3-4: Facet osteoarthritis with 2 mm of anterolisthesis. Bulging of the disc. No compressive stenosis. L4-5: Facet osteoarthritis with 2 mm of  anterolisthesis. Bulging of the disc. No compressive stenosis. L5-S1: Disc space narrowing with osteophytes more prominent on the right. Bilateral facet osteoarthritis. No compressive canal stenosis. Foraminal narrowing on the right with some potential to affect the right L5 nerve. IMPRESSION: No acute or traumatic finding. Chronic degenerative changes in the lower lumbar region as outlined above. Electronically Signed   By: Nelson Chimes M.D.   On: 08/23/2019 19:26   Ct Pelvis Wo Contrast  Result Date: 08/23/2019 CLINICAL DATA:  Acute pelvic fractures secondary to a fall. EXAM: CT PELVIS WITHOUT CONTRAST TECHNIQUE: Multidetector CT imaging of the pelvis was performed following the standard protocol without intravenous contrast. COMPARISON:  Radiographs dated 08/23/2019 FINDINGS: Musculoskeletal: There is a comminuted impacted fracture of the left acetabulum involving the anterior and posterior columns. The fracture fragments extend medially into the pelvis. There are 2 fractures of the left inferior pubic ramus. The sacrum and right hemipelvis are intact. Proximal femurs are intact. No dislocation. There is only minimal hemorrhage in the left side of the pelvis without a defined hematoma. Urinary Tract: The bladder is slightly deviated to the right by the hemorrhage but otherwise appears normal. Bowel:  Extensive sigmoid diverticulosis. Vascular/Lymphatic: No acute abnormality. Aortic atherosclerosis. Reproductive:  Hysterectomy. No adnexal masses. Other:  None. IMPRESSION: 1. Comminuted impacted fracture of the left acetabulum involving the anterior and posterior columns. 2. Two fractures of the left inferior pubic ramus. 3. Minimal hemorrhage in the left side of the pelvis without a defined hematoma. Aortic Atherosclerosis (ICD10-I70.0). Electronically Signed   By: Lorriane Shire M.D.   On: 08/23/2019 20:08   Dg Hip Unilat With Pelvis 2-3 Views Left  Result Date: 08/23/2019 CLINICAL DATA:  Fall EXAM: DG  HIP (WITH OR WITHOUT PELVIS) 2-3V LEFT COMPARISON:  CT 01/12/2019 FINDINGS: Bones appear osteopenic. SI joints are non widened. The pubic symphysis is intact. Right femoral head projects in joint. Moderate degenerative change. Acute comminuted left acetabular fracture with mild protrusio deformity. Acute mildly comminuted fracture left inferior pubic ramus. Probable acute fracture of the left superior pubic ramus near its junction with the acetabulum. Possible left sacral fracture near the inferior SI joint. IMPRESSION: 1. Acute comminuted left acetabular fracture 2. Acute comminuted left inferior pubic ramus fracture with probable fracture of the left superior pubic ramus. 3. Possible acute left sacral fracture near the inferior SI joint Electronically Signed   By: Donavan Foil M.D.   On: 08/23/2019 19:12    Pending Labs Unresulted Labs (From admission, onward)    Start     Ordered   08/23/19 2039  SARS CORONAVIRUS 2 (TAT 6-24 HRS) Nasopharyngeal  Nasopharyngeal Swab  (Asymptomatic/Tier 2 Patients Labs)  Once,   STAT    Question Answer Comment  Is this test for diagnosis or screening Screening   Symptomatic for COVID-19 as defined by CDC No   Hospitalized for COVID-19 No   Admitted to ICU for COVID-19 No   Previously tested for COVID-19 Unknown   Resident in a congregate (group) care setting Yes   Employed in healthcare setting No   Pregnant No      08/23/19 2038          Vitals/Pain Today's Vitals   08/23/19 1945 08/23/19 2030 08/23/19 2100 08/23/19 2130  BP: 112/81 119/63 116/64 104/65  Pulse: 88     Resp: 15 18 20 12   Temp:      TempSrc:      SpO2: 96%     PainSc:        Isolation Precautions No active isolations  Medications Medications  acetaminophen (TYLENOL) tablet 650 mg (has no administration in time range)  fentaNYL (SUBLIMAZE) injection 50 mcg (50 mcg Intravenous Given 08/23/19 1837)  fentaNYL (SUBLIMAZE) injection 50 mcg (50 mcg Intravenous Given 08/23/19 2131)     Mobility non-ambulatory High fall risk   Focused Assessments    R Recommendations: See Admitting Provider Note  Report given to:   Additional Notes:

## 2019-08-23 NOTE — ED Triage Notes (Signed)
Pt bib [facility] with compliants of witnessed fall. Pt from [facility] memory care. Pt was walking with walker and "fell" onto her bottom. Pt states her pain is 10/10. Pt on coumadin. According to staff pt never left go of walker. Possible 'sat down" without a chair being there.  Pt alert and oriented X3

## 2019-08-24 ENCOUNTER — Encounter (HOSPITAL_COMMUNITY): Payer: Self-pay | Admitting: *Deleted

## 2019-08-24 DIAGNOSIS — D72829 Elevated white blood cell count, unspecified: Secondary | ICD-10-CM

## 2019-08-24 DIAGNOSIS — S329XXD Fracture of unspecified parts of lumbosacral spine and pelvis, subsequent encounter for fracture with routine healing: Secondary | ICD-10-CM

## 2019-08-24 LAB — COMPREHENSIVE METABOLIC PANEL
ALT: 17 U/L (ref 0–44)
AST: 27 U/L (ref 15–41)
Albumin: 2.9 g/dL — ABNORMAL LOW (ref 3.5–5.0)
Alkaline Phosphatase: 65 U/L (ref 38–126)
Anion gap: 13 (ref 5–15)
BUN: 24 mg/dL — ABNORMAL HIGH (ref 8–23)
CO2: 25 mmol/L (ref 22–32)
Calcium: 9 mg/dL (ref 8.9–10.3)
Chloride: 98 mmol/L (ref 98–111)
Creatinine, Ser: 1.21 mg/dL — ABNORMAL HIGH (ref 0.44–1.00)
GFR calc Af Amer: 46 mL/min — ABNORMAL LOW (ref >60)
GFR calc non Af Amer: 39 mL/min — ABNORMAL LOW (ref >60)
Glucose, Bld: 157 mg/dL — ABNORMAL HIGH (ref 70–99)
Potassium: 3.9 mmol/L (ref 3.5–5.1)
Sodium: 136 mmol/L (ref 135–145)
Total Bilirubin: 0.3 mg/dL (ref 0.3–1.2)
Total Protein: 7 g/dL (ref 6.5–8.1)

## 2019-08-24 LAB — CBC
HCT: 31.3 % — ABNORMAL LOW (ref 36.0–46.0)
Hemoglobin: 9.6 g/dL — ABNORMAL LOW (ref 12.0–15.0)
MCH: 27.9 pg (ref 26.0–34.0)
MCHC: 30.7 g/dL (ref 30.0–36.0)
MCV: 91 fL (ref 80.0–100.0)
Platelets: 412 10*3/uL — ABNORMAL HIGH (ref 150–400)
RBC: 3.44 MIL/uL — ABNORMAL LOW (ref 3.87–5.11)
RDW: 14.6 % (ref 11.5–15.5)
WBC: 19.5 10*3/uL — ABNORMAL HIGH (ref 4.0–10.5)
nRBC: 0 % (ref 0.0–0.2)

## 2019-08-24 LAB — PROTIME-INR
INR: 1.8 — ABNORMAL HIGH (ref 0.8–1.2)
Prothrombin Time: 20.8 seconds — ABNORMAL HIGH (ref 11.4–15.2)

## 2019-08-24 LAB — MRSA PCR SCREENING: MRSA by PCR: NEGATIVE

## 2019-08-24 LAB — SARS CORONAVIRUS 2 (TAT 6-24 HRS): SARS Coronavirus 2: NEGATIVE

## 2019-08-24 NOTE — Consult Note (Signed)
Reason for Consult:Left pelvic fxs Referring Physician: [name] is an 85 y.o. female.  HPI: Kendra Smith fell at her memory care unit yesterday. She was using her RW but doesn't really know how or why she fell. She has mod dementia and could not contribute much to history. She does admit to pain in her left hip/groin.  Past Medical History:  Diagnosis Date  . Depression   . Glaucoma   . Hyperlipidemia   . Hypertension   . Osteoporosis     Past Surgical History:  Procedure Laterality Date  . ABDOMINAL HYSTERECTOMY    . ANKLE ARTHROSCOPY    . CHOLECYSTECTOMY    . EYE SURGERY    . RECTOCELE REPAIR      History reviewed. No pertinent family history.  Social History:  reports that she has never smoked. She has never used smokeless tobacco. She reports that she does not drink alcohol or use drugs.  Allergies:  Allergies  Allergen Reactions  . Ciprofloxacin Hcl Other (See Comments)    On MAR  . Effexor [Venlafaxine] Other (See Comments)    On MAR  . Gantrisin [Sulfisoxazole] Other (See Comments)    On MAR  . Levaquin [Levofloxacin] Other (See Comments)    On MAR  . Sulfa Antibiotics Rash    Medications: I have reviewed the patient's current medications.  Results for orders placed or performed during the hospital encounter of 08/23/19 (from the past 48 hour(s))  Basic metabolic panel     Status: Abnormal   Collection Time: 08/23/19  5:44 PM  Result Value Ref Range   Sodium 138 135 - 145 mmol/L   Potassium 3.8 3.5 - 5.1 mmol/L   Chloride 99 98 - 111 mmol/L   CO2 27 22 - 32 mmol/L   Glucose, Bld 149 (H) 70 - 99 mg/dL   BUN 24 (H) 8 - 23 mg/dL   Creatinine, Ser 1.25 (H) 0.44 - 1.00 mg/dL   Calcium 9.1 8.9 - 10.3 mg/dL   GFR calc non Af Amer 38 (L) >60 mL/min   GFR calc Af Amer 44 (L) >60 mL/min   Anion gap 12 5 - 15    Comment: Performed at [facility], [address], [place], [place] [address]  CBC with Differential     Status: Abnormal    Collection Time: 08/23/19  5:44 PM  Result Value Ref Range   WBC 15.8 (H) 4.0 - 10.5 K/uL   RBC 3.86 (L) 3.87 - 5.11 MIL/uL   Hemoglobin 11.1 (L) 12.0 - 15.0 g/dL   HCT 36.0 36.0 - 46.0 %   MCV 93.3 80.0 - 100.0 fL   MCH 28.8 26.0 - 34.0 pg   MCHC 30.8 30.0 - 36.0 g/dL   RDW 14.6 11.5 - 15.5 %   Platelets 415 (H) 150 - 400 K/uL   nRBC 0.0 0.0 - 0.2 %   Neutrophils Relative % 74 %   Neutro Abs 11.7 (H) 1.7 - 7.7 K/uL   Lymphocytes Relative 15 %   Lymphs Abs 2.3 0.7 - 4.0 K/uL   Monocytes Relative 8 %   Monocytes Absolute 1.2 (H) 0.1 - 1.0 K/uL   Eosinophils Relative 2 %   Eosinophils Absolute 0.3 0.0 - 0.5 K/uL   Basophils Relative 0 %   Basophils Absolute 0.0 0.0 - 0.1 K/uL   Immature Granulocytes 1 %   Abs Immature Granulocytes 0.18 (H) 0.00 - 0.07 K/uL    Comment: Performed at [facility], [address] [place], [place] [address]  Type and screen [facility]     Status: None   Collection Time: 08/23/19  9:30 PM  Result Value Ref Range   ABO/RH(D) B POS    Antibody Screen NEG    Sample Expiration      08/26/2019,2359 Performed at [facility], [address], [place], [place] [address]   ABO/Rh     Status: None   Collection Time: 08/23/19  9:30 PM  Result Value Ref Range   ABO/RH(D)      B POS Performed at [facility], [address], [place], [place] [address]   SARS CORONAVIRUS 2 (TAT 6-24 HRS) Nasopharyngeal Nasopharyngeal Swab     Status: None   Collection Time: 08/23/19  9:32 PM   Specimen: Nasopharyngeal Swab  Result Value Ref Range   SARS Coronavirus 2 NEGATIVE NEGATIVE    Comment: (NOTE) SARS-CoV-2 target nucleic acids are NOT DETECTED. The SARS-CoV-2 RNA is generally detectable in upper and lower respiratory specimens during the acute phase of infection. Negative results do not preclude SARS-CoV-2 infection, do not rule out co-infections with other pathogens, and should not be used as the sole basis for treatment or  other patient management decisions. Negative results must be combined with clinical observations, patient history, and epidemiological information. The expected result is Negative. Fact Sheet for Patients: HairSlick.no Fact Sheet for Healthcare Providers: quierodirigir.com This test is not yet approved or cleared by the [place] FDA and  has been authorized for detection and/or diagnosis of SARS-CoV-2 by FDA under an Emergency Use Authorization (EUA). This EUA will remain  in effect (meaning this test can be used) for the duration of the COVID-19 declaration under Section 56 4(b)(1) of the Act, 21 U.S.C. section 360bbb-3(b)(1), unless the authorization is terminated or revoked sooner. Performed at [facility], [address] [place], [place] [address]   Protime-INR     Status: Abnormal   Collection Time: 08/23/19  9:39 PM  Result Value Ref Range   Prothrombin Time 21.1 (H) 11.4 - 15.2 seconds   INR 1.9 (H) 0.8 - 1.2    Comment: (NOTE) INR goal varies based on device and disease states. Performed at [facility], [address], [place], [place] [address]   MRSA PCR Screening     Status: None   Collection Time: 08/23/19 11:58 PM   Specimen: Nasal Mucosa; Nasopharyngeal  Result Value Ref Range   MRSA by PCR NEGATIVE NEGATIVE    Comment:        The GeneXpert MRSA Assay (FDA approved for NASAL specimens only), is one component of a comprehensive MRSA colonization surveillance program. It is not intended to diagnose MRSA infection nor to guide or monitor treatment for MRSA infections. Performed at [facility], [address] [place] [address] [place], [place] [address]   Protime-INR     Status: Abnormal   Collection Time: 08/24/19  3:19 AM  Result Value Ref Range   Prothrombin Time 20.8 (H) 11.4 - 15.2 seconds   INR 1.8 (H) 0.8 - 1.2    Comment: (NOTE) INR goal varies based on device and disease  states. Performed at [facility], [address] [place], [place] [address]   Comprehensive metabolic panel     Status: Abnormal   Collection Time: 08/24/19  3:19 AM  Result Value Ref Range   Sodium 136 135 - 145 mmol/L  Potassium 3.9 3.5 - 5.1 mmol/L   Chloride 98 98 - 111 mmol/L   CO2 25 22 - 32 mmol/L   Glucose, Bld 157 (H) 70 - 99 mg/dL   BUN 24 (H) 8 - 23 mg/dL   Creatinine, Ser 0.45 (H) 0.44 - 1.00 mg/dL   Calcium 9.0 8.9 - 40.9 mg/dL   Total Protein 7.0 6.5 - 8.1 g/dL   Albumin 2.9 (L) 3.5 - 5.0 g/dL   AST 27 15 - 41 U/L   ALT 17 0 - 44 U/L   Alkaline Phosphatase 65 38 - 126 U/L   Total Bilirubin 0.3 0.3 - 1.2 mg/dL   GFR calc non Af Amer 39 (L) >60 mL/min   GFR calc Af Amer 46 (L) >60 mL/min   Anion gap 13 5 - 15    Comment: Performed at [facility], [address] [place], [place] [address]  CBC     Status: Abnormal   Collection Time: 08/24/19  3:19 AM  Result Value Ref Range   WBC 19.5 (H) 4.0 - 10.5 K/uL   RBC 3.44 (L) 3.87 - 5.11 MIL/uL   Hemoglobin 9.6 (L) 12.0 - 15.0 g/dL   HCT 47.8 (L) 29.5 - 62.1 %   MCV 91.0 80.0 - 100.0 fL   MCH 27.9 26.0 - 34.0 pg   MCHC 30.7 30.0 - 36.0 g/dL   RDW 30.8 65.7 - 84.6 %   Platelets 412 (H) 150 - 400 K/uL   nRBC 0.0 0.0 - 0.2 %    Comment: Performed at [facility], [address] [place], [place] [address]    Dg Chest 1 View  Result Date: 08/23/2019 CLINICAL DATA:  Fall with hip fracture EXAM: CHEST  1 VIEW COMPARISON:  06/29/2014, CT 01/12/2019 FINDINGS: Mild a pickle pleural and parenchymal scarring. Mild scarring at the left base. No acute airspace disease or effusion. Normal cardiomediastinal silhouette with aortic atherosclerosis. No pneumothorax. IMPRESSION: No active disease. Electronically Signed   By: Jasmine Pang M.D.   On: 08/23/2019 19:13   Ct Head Wo Contrast  Result Date: 08/23/2019 CLINICAL DATA:  85 year old female with history of trauma from a fall. EXAM: CT HEAD WITHOUT  CONTRAST CT CERVICAL SPINE WITHOUT CONTRAST TECHNIQUE: Multidetector CT imaging of the head and cervical spine was performed following the standard protocol without intravenous contrast. Multiplanar CT image reconstructions of the cervical spine were also generated. COMPARISON:  Head CT 05/11/2019. FINDINGS: CT HEAD FINDINGS Brain: Physiologic calcifications in the right basal ganglia. Moderate cerebral atrophy. Patchy and confluent areas of decreased attenuation are noted throughout the deep and periventricular white matter of the cerebral hemispheres bilaterally, compatible with chronic microvascular ischemic disease. No evidence of acute infarction, hemorrhage, hydrocephalus, extra-axial collection or mass lesion/mass effect. Vascular: No hyperdense vessel or unexpected calcification. Skull: Normal. Negative for fracture or focal lesion. Sinuses/Orbits: No acute finding. Other: None. CT CERVICAL SPINE FINDINGS Comment: Study is limited by considerable patient motion. Alignment: Normal. Skull base and vertebrae: No acute fracture. No primary bone lesion or focal pathologic process. Soft tissues and spinal canal: No prevertebral fluid or swelling. No visible canal hematoma. Disc levels: Multilevel degenerative disc disease, most pronounced at C5-C6. Mild multilevel facet arthropathy. Upper chest: Calcified area of pleuroparenchymal thickening in the apex of the left hemithorax, most compatible with chronic post infectious or inflammatory scarring. Other: None. IMPRESSION: 1. No evidence of significant acute traumatic injury to the skull, brain or cervical spine. 2. Moderate cerebral atrophy  with extensive chronic microvascular ischemic changes in the cerebral white matter, as above. 3. Mild multilevel degenerative disc disease and cervical spondylosis, as above. Electronically Signed   By: Trudie Reed M.D.   On: 08/23/2019 19:50   Ct Cervical Spine Wo Contrast  Result Date: 08/23/2019 CLINICAL DATA:   85 year old female with history of trauma from a fall. EXAM: CT HEAD WITHOUT CONTRAST CT CERVICAL SPINE WITHOUT CONTRAST TECHNIQUE: Multidetector CT imaging of the head and cervical spine was performed following the standard protocol without intravenous contrast. Multiplanar CT image reconstructions of the cervical spine were also generated. COMPARISON:  Head CT 05/11/2019. FINDINGS: CT HEAD FINDINGS Brain: Physiologic calcifications in the right basal ganglia. Moderate cerebral atrophy. Patchy and confluent areas of decreased attenuation are noted throughout the deep and periventricular white matter of the cerebral hemispheres bilaterally, compatible with chronic microvascular ischemic disease. No evidence of acute infarction, hemorrhage, hydrocephalus, extra-axial collection or mass lesion/mass effect. Vascular: No hyperdense vessel or unexpected calcification. Skull: Normal. Negative for fracture or focal lesion. Sinuses/Orbits: No acute finding. Other: None. CT CERVICAL SPINE FINDINGS Comment: Study is limited by considerable patient motion. Alignment: Normal. Skull base and vertebrae: No acute fracture. No primary bone lesion or focal pathologic process. Soft tissues and spinal canal: No prevertebral fluid or swelling. No visible canal hematoma. Disc levels: Multilevel degenerative disc disease, most pronounced at C5-C6. Mild multilevel facet arthropathy. Upper chest: Calcified area of pleuroparenchymal thickening in the apex of the left hemithorax, most compatible with chronic post infectious or inflammatory scarring. Other: None. IMPRESSION: 1. No evidence of significant acute traumatic injury to the skull, brain or cervical spine. 2. Moderate cerebral atrophy with extensive chronic microvascular ischemic changes in the cerebral white matter, as above. 3. Mild multilevel degenerative disc disease and cervical spondylosis, as above. Electronically Signed   By: Trudie Reed M.D.   On: 08/23/2019 19:50    Ct Thoracic Spine Wo Contrast  Result Date: 08/23/2019 CLINICAL DATA:  85 year old female status post fall with back pain. EXAM: CT THORACIC SPINE WITHOUT CONTRAST TECHNIQUE: Multidetector CT images of the thoracic were obtained using the standard protocol without intravenous contrast. COMPARISON:  Lumbar spine CT today reported separately. FINDINGS: Limited cervical spine imaging: Visible lower cervical vertebrae appear grossly intact. Cervicothoracic junction alignment is within normal limits. Thoracic spine segmentation:  Normal. Alignment: Mildly exaggerated thoracic kyphosis. No spondylolisthesis. Vertebrae: Osteopenia. Benign bone island in the posterior right T3 body suspected. No acute thoracic vertebral fracture identified by CT. Partially visible chronic fracture of the posterior left 12th rib on series 5, image 160. Partially visible chronic sternal fracture on sagittal image 29. Paraspinal and other soft tissues: Calcified aortic atherosclerosis. Calcified coronary artery atherosclerosis. Vascular patency is not evaluated in the absence of IV contrast. No pericardial or pleural effusion. Mild pulmonary atelectasis. Surgically absent gallbladder. Otherwise negative visible noncontrast upper abdominal viscera. Paraspinal soft tissues are within normal limits. Disc levels: Capacious thoracic spinal canal. No age advanced thoracic spine degeneration identified. IMPRESSION: 1. Osteopenia. No acute traumatic injury identified in the thoracic spine. Thoracic MRI without contrast or Nuclear Medicine Whole-body Bone Scan would be most sensitive for detection of occult compression fracture. 2. Partially visible chronic fractures of the left 12th rib and the sternum. 3.  Aortic Atherosclerosis (ICD10-I70.0). Electronically Signed   By: Odessa Fleming M.D.   On: 08/23/2019 19:49   Ct Lumbar Spine Wo Contrast  Result Date: 08/23/2019 CLINICAL DATA:  Fall.  Anticoagulated.  Diffuse back pain. EXAM: CT LUMBAR  SPINE WITHOUT CONTRAST TECHNIQUE: Multidetector CT imaging of the lumbar spine was performed without intravenous contrast administration. Multiplanar CT image reconstructions were also generated. COMPARISON:  None. FINDINGS: Segmentation: Lumbar type vertebral bodies. Alignment: 2 mm degenerative anterolisthesis L3-4 and L4-5. Vertebrae: No fracture or primary bone lesion. Paraspinal and other soft tissues: Negative except for aortic atherosclerosis and right renal cysts. Disc levels: No significant finding at L2-3 or above. L3-4: Facet osteoarthritis with 2 mm of anterolisthesis. Bulging of the disc. No compressive stenosis. L4-5: Facet osteoarthritis with 2 mm of anterolisthesis. Bulging of the disc. No compressive stenosis. L5-S1: Disc space narrowing with osteophytes more prominent on the right. Bilateral facet osteoarthritis. No compressive canal stenosis. Foraminal narrowing on the right with some potential to affect the right L5 nerve. IMPRESSION: No acute or traumatic finding. Chronic degenerative changes in the lower lumbar region as outlined above. Electronically Signed   By: Paulina FusiMark  Shogry M.D.   On: 08/23/2019 19:26   Ct Pelvis Wo Contrast  Result Date: 08/23/2019 CLINICAL DATA:  Acute pelvic fractures secondary to a fall. EXAM: CT PELVIS WITHOUT CONTRAST TECHNIQUE: Multidetector CT imaging of the pelvis was performed following the standard protocol without intravenous contrast. COMPARISON:  Radiographs dated 08/23/2019 FINDINGS: Musculoskeletal: There is a comminuted impacted fracture of the left acetabulum involving the anterior and posterior columns. The fracture fragments extend medially into the pelvis. There are 2 fractures of the left inferior pubic ramus. The sacrum and right hemipelvis are intact. Proximal femurs are intact. No dislocation. There is only minimal hemorrhage in the left side of the pelvis without a defined hematoma. Urinary Tract: The bladder is slightly deviated to the right  by the hemorrhage but otherwise appears normal. Bowel:  Extensive sigmoid diverticulosis. Vascular/Lymphatic: No acute abnormality. Aortic atherosclerosis. Reproductive:  Hysterectomy. No adnexal masses. Other:  None. IMPRESSION: 1. Comminuted impacted fracture of the left acetabulum involving the anterior and posterior columns. 2. Two fractures of the left inferior pubic ramus. 3. Minimal hemorrhage in the left side of the pelvis without a defined hematoma. Aortic Atherosclerosis (ICD10-I70.0). Electronically Signed   By: Francene BoyersJames  Maxwell M.D.   On: 08/23/2019 20:08   Dg Hip Unilat With Pelvis 2-3 Views Left  Result Date: 08/23/2019 CLINICAL DATA:  Fall EXAM: DG HIP (WITH OR WITHOUT PELVIS) 2-3V LEFT COMPARISON:  CT 01/12/2019 FINDINGS: Bones appear osteopenic. SI joints are non widened. The pubic symphysis is intact. Right femoral head projects in joint. Moderate degenerative change. Acute comminuted left acetabular fracture with mild protrusio deformity. Acute mildly comminuted fracture left inferior pubic ramus. Probable acute fracture of the left superior pubic ramus near its junction with the acetabulum. Possible left sacral fracture near the inferior SI joint. IMPRESSION: 1. Acute comminuted left acetabular fracture 2. Acute comminuted left inferior pubic ramus fracture with probable fracture of the left superior pubic ramus. 3. Possible acute left sacral fracture near the inferior SI joint Electronically Signed   By: Jasmine PangKim  Fujinaga M.D.   On: 08/23/2019 19:12    Review of Systems  Unable to perform ROS: Dementia  Musculoskeletal: Positive for joint pain (Left hip).   Blood pressure (!) 160/68, pulse (!) 103, temperature 97.8 F (36.6 C), temperature source Oral, resp. rate 14, SpO2 94 %. Physical Exam  Constitutional: She appears well-developed and well-nourished. No distress.  HENT:  Head: Normocephalic and atraumatic.  Eyes: Conjunctivae are normal. Right eye exhibits no discharge. Left eye  exhibits no discharge. No scleral icterus.  Neck: Normal range of motion.  Cardiovascular:  Normal rate and regular rhythm.  Respiratory: Effort normal. No respiratory distress.  Musculoskeletal:     Comments: LLE No traumatic wounds, ecchymosis, or rash  TTP hip  No knee or ankle effusion  Knee stable to varus/ valgus and anterior/posterior stress  Sens DPN, SPN, TN intact  Motor EHL, ext, flex, evers 5/5  DP 1+, PT 1+, No significant edema  Neurological: She is alert.  Skin: Skin is warm and dry. She is not diaphoretic.  Psychiatric: She has a normal mood and affect. Her behavior is normal.    Assessment/Plan: Left acet fx -- Non-operative management given age and inability to follow strict WB requirements. Left pubic rami fxs -- Non-operative management Multiple medical problems including dementia, hypertension, paroxysmal atrial fibrillation on chronic anticoagulation, glaucoma, depression and hyperlipidemia -- per primary service    Freeman Caldron, PA-C Orthopedic Surgery [phone] 08/24/2019, 10:19 AM

## 2019-08-24 NOTE — Progress Notes (Signed)
Late Entry: Patient is alert and oriented to self and person, nurse unable to do admission screening because of patient forgetfulness, on bedrest, on coumadin for A-fib, pain medicine given this morning, on heart healthy diet since admission,  IV fluid infusing at 45ml/hr, breakfast ordered and day shift nurse updated,wll continue to monior.

## 2019-08-24 NOTE — Evaluation (Signed)
Physical Therapy Evaluation Patient Details Name: Kendra Smith MRN: [id number] DOB: August 01, 1926 Today's Date: 08/24/2019   History of Present Illness  Kendra Smith is a 85 y.o. female with medical history significant of dementia, hypertension, paroxysmal atrial fibrillation on chronic anticoagulation, glaucoma, depression and hyperlipidemia who was brought in as secondary to a fall.  Patient was apparently walking around with her walker when she fell. She was brought into the ER where she was evaluated and found to have left acetabular fracture with some inferior pubic rami fractures.  Non operative per trauma due to dementia.   Clinical Impression  Pt admitted with above diagnosis. Pt was able to sit EOB with mod assist of 2 persons to come to EOB.  Incr pain with movement.  Did not attempt to stand as pt most likely cannot maintain NWB left LE and pt c/o dizziness at EOB.  Will follow acutely and progress pt as able.   Pt currently with functional limitations due to the deficits listed below (see PT Problem List). Pt will benefit from skilled PT to increase their independence and safety with mobility to allow discharge to the venue listed below.      Follow Up Recommendations SNF;Supervision/Assistance - 24 hour    Equipment Recommendations  Other (comment)(TBA)    Recommendations for Other Services       Precautions / Restrictions Precautions Precautions: Fall Restrictions Weight Bearing Restrictions: Yes LLE Weight Bearing: Non weight bearing      Mobility  Bed Mobility Overal bed mobility: Needs Assistance Bed Mobility: Rolling;Sidelying to Sit Rolling: Min guard Sidelying to sit: Mod assist;+2 for physical assistance       General bed mobility comments: Pt used rail and was able to roll without PT assist.  Pt did need mod assist to sit up with incr pain in left hip/groin  Transfers                 General transfer comment: unable  Ambulation/Gait              General Gait Details: unable  Stairs            Wheelchair Mobility    Modified Rankin (Stroke Patients Only)       Balance Overall balance assessment: Needs assistance;History of Falls Sitting-balance support: Bilateral upper extremity supported;Feet supported Sitting balance-Leahy Scale: Poor Sitting balance - Comments: relies on UEs support. Initially leaning to her left in sitting but eventually she got her baalance.  Sat about 5 minutes and c/o dizziness (did wash her back while up).    Postural control: Left lateral lean                                   Pertinent Vitals/Pain Pain Assessment: Faces Faces Pain Scale: Hurts worst Pain Location: pelvis, left hip Pain Descriptors / Indicators: Aching;Grimacing;Guarding Pain Intervention(s): Limited activity within patient's tolerance;Monitored during session;Repositioned;Premedicated before session    Home Living Family/patient expects to be discharged to:: Skilled nursing facility                      Prior Function           Comments: Pt was at Memory care [facility] PTA - had assistance with adls from PCA     Hand Dominance        Extremity/Trunk Assessment   Upper Extremity Assessment Upper Extremity Assessment: Defer to OT  evaluation    Lower Extremity Assessment Lower Extremity Assessment: LLE deficits/detail LLE: Unable to fully assess due to pain    Cervical / Trunk Assessment Cervical / Trunk Assessment: Kyphotic  Communication   Communication: No difficulties  Cognition Arousal/Alertness: Awake/alert Behavior During Therapy: Anxious;Restless Overall Cognitive Status: History of cognitive impairments - at baseline                                 General Comments: Hx of dementia      General Comments      Exercises General Exercises - Lower Extremity Ankle Circles/Pumps: AROM;Both;5 reps;Supine Heel Slides: AAROM;Both;5 reps;Supine    Assessment/Plan    PT Assessment Patient needs continued PT services  PT Problem List Decreased activity tolerance;Decreased balance;Decreased mobility;Decreased knowledge of use of DME;Decreased safety awareness;Decreased knowledge of precautions;Decreased cognition;Pain       PT Treatment Interventions DME instruction;Functional mobility training;Therapeutic activities;Therapeutic exercise;Balance training;Cognitive remediation;Patient/family education    PT Goals (Current goals can be found in the Care Plan section)  Acute Rehab PT Goals Patient Stated Goal: unable to state PT Goal Formulation: Patient unable to participate in goal setting Time For Goal Achievement: 09/07/19 Potential to Achieve Goals: Fair    Frequency Min 2X/week   Barriers to discharge Decreased caregiver support      Co-evaluation               AM-PAC PT "6 Clicks" Mobility  Outcome Measure Help needed turning from your back to your side while in a flat bed without using bedrails?: A Little Help needed moving from lying on your back to sitting on the side of a flat bed without using bedrails?: A Lot Help needed moving to and from a bed to a chair (including a wheelchair)?: Total Help needed standing up from a chair using your arms (e.g., wheelchair or bedside chair)?: Total Help needed to walk in hospital room?: Total Help needed climbing 3-5 steps with a railing? : Total 6 Click Score: 9    End of Session   Activity Tolerance: Patient limited by fatigue;Patient limited by pain Patient left: in bed;with call bell/phone within reach;with bed alarm set Nurse Communication: Mobility status;Need for lift equipment PT Visit Diagnosis: Muscle weakness (generalized) (M62.81);Repeated falls (R29.6);Pain Pain - Right/Left: Left Pain - part of body: Leg    Time: 1147-1200 PT Time Calculation (min) (ACUTE ONLY): 13 min   Charges:   PT Evaluation $PT Eval Moderate Complexity: 1 Mod           Peter Daquila,PT Acute Rehabilitation Services Pager:  [phone]  Office:  [phone]    Berline Lopes 08/24/2019, 1:39 PM

## 2019-08-24 NOTE — Progress Notes (Signed)
ANTICOAGULATION CONSULT NOTE - Follow Up Consult  Pharmacy Consult for Warfarin Indication: atrial fibrillation  Allergies  Allergen Reactions  . Ciprofloxacin Hcl Other (See Comments)    On MAR  . Effexor [Venlafaxine] Other (See Comments)    On MAR  . Gantrisin [Sulfisoxazole] Other (See Comments)    On MAR  . Levaquin [Levofloxacin] Other (See Comments)    On MAR  . Sulfa Antibiotics Rash    Patient Measurements: Height: 5\' 3"  (160 cm) Weight: 153 lb (69.4 kg) IBW/kg (Calculated) : 52.4  Vital Signs: Temp: 98.2 F (36.8 C) (10/14 1337) Temp Source: Oral (10/14 1337) BP: 135/47 (10/14 1337) Pulse Rate: 94 (10/14 1337)  Labs: Recent Labs    08/23/19 1744 08/23/19 2139 08/24/19 0319  HGB 11.1*  --  9.6*  HCT 36.0  --  31.3*  PLT 415*  --  412*  LABPROT  --  21.1* 20.8*  INR  --  1.9* 1.8*  CREATININE 1.25*  --  1.21*    Estimated Creatinine Clearance: 28.9 mL/min (A) (by C-G formula based on SCr of 1.21 mg/dL (H)).  Assessment:  85 y/o F from memory care facility with fall, CT head negative, on warfarin PTA for afib, continuing warfarin.  INR sub-therapeutic at 1.9 on admit 10/13 pm.  Planning non-operative management of L acetabular and pubic rami fractures.    INR 1.8 today.  Warfarin 3 mg x 1 given ~3am today.  Had received usual 3 mg dose at facility prior to admission.   On Toradol 15 mg IV q6hr prn pain. Has had 2 doses today.      PTA regimen: 2 mg daily except 3 mg on Sundays, last dose 10/13  Goal of Therapy:  INR 2-3 Monitor platelets by anticoagulation protocol: Yes   Plan:   No additional Warfarin this evening.  Dose given ~3am.  Daily PT/INR.  Monitor for any s/sx bleeding.  Watch prn Toradol use, renal function.  Arty Baumgartner, [place] Pager: [phone] or phone: [phone] 08/24/2019,2:45 PM

## 2019-08-24 NOTE — Progress Notes (Signed)
PROGRESS NOTE  Kendra Smith EHM:[id number] DOB: 1926/10/11 DOA: 08/23/2019 PCP: Patient, No Pcp Per  Brief History    Kendra Smith is a 85 y.o. female with medical history significant of dementia, hypertension, paroxysmal atrial fibrillation on chronic anticoagulation, glaucoma, depression and hyperlipidemia who was brought in as secondary to a fall.  Patient was apparently walking around with her walker when she fell.  She felt weak in her legs and went down.  She did not hit her head.  She did not know if she tripped on anything.  She laid down on her left side and felt immediate pain.  She started having the pain on the left side and lower back.  She was brought into the ER where she was evaluated and found to have left acetabular fracture with some inferior pubic rami fractures and possible sacral fractures.  She is fully waited at the moment with no obvious loss of consciousness.  Orthopedics have been consulted and patient is being admitted to the hospital for treatment and evaluation.  Her pain is currently at 8 out of 10.  She has received multiple doses of morphine and is getting better.  She is being admitted with orthopedic follow-up.  ED Course: Temperature is 98.9 blood pressure 119/70 pulse 106 respiratory of 20 oxygen sat 87% on room air currently 92% on 2 L.  White count 15.8 hemoglobin 11.1 platelets 415.  Sodium 138 potassium 3.8 chloride 99 CO2 27 BUN 24 and creatinine of 1.25.  INR is 1.9 and glucose 149.  Full x-rays of the neck, pelvis chest done.  Most notable finding is acute comminuted left acetabular fracture, acute Camitta left inferior pubic ramus fracture and possible left superior pubic ramus fracture.  Also acute left sacral fracture near the inferior SI joint.  Orthopedic consulted and patient is being admitted for treatment.  Urinalysis showed positive nitrite but no symptoms of dysuria.  The patient was admitted to a telemetry bed, and orthopedic surgery has been  consulted. They have recommended non-operative management for her left acetabular fracture and left pubic rami fractures given her age and inability to follow strict weight bearing requirements.   Consultants  . Orthopedic surgery  Procedures  . None  Antibiotics   Anti-infectives (From admission, onward)   None    .  Subjective  The patient is resting. She is complaining of pain in her left hip/groin area.  Objective   Vitals:  Vitals:   08/24/19 0812 08/24/19 1337  BP: (!) 160/68 (!) 135/47  Pulse: (!) 103 94  Resp: 14 16  Temp: 97.8 F (36.6 C) 98.2 F (36.8 C)  SpO2: 94% 98%   Exam:  Constitutional:  . The patient is awake, alert, and confused. Mild distress from left hip and groin pain. Respiratory:  . No increased work of breathing. . No wheezes, rales, or rhonchi . No tactile fremitus Cardiovascular:  . Regular rate and rhythm . No murmurs, ectopy, or gallups. . No lateral PMI. No thrills. Abdomen:  . Abdomen is soft, non-tender, non-distended . No hernias, masses, or organomegaly . Normoactive bowel sounds.  Musculoskeletal:  . No cyanosis, clubbing, or edema Skin:  . No rashes, lesions, ulcers . palpation of skin: no induration or nodules Neurologic:  Unable to evaluate due to the patient's inability to cooperate with exam. Psychiatric:  . Unable to evaluate due to the patient's inability to cooperate with exam.  I have personally reviewed the following:   Today's Data  . Vitals, CMP, CBC  Scheduled Meds: . acetaminophen  650 mg Oral Once  . calcium carbonate  500 mg of elemental calcium Oral Q breakfast  . cholecalciferol  1,000 Units Oral Daily  . digoxin  0.125 mg Oral Q M,W,F  . donepezil  5 mg Oral QHS  . DULoxetine  60 mg Oral Daily  . [START ON 08/25/2019] furosemide  20 mg Oral QODAY  . latanoprost  1 drop Both Eyes QHS  . timolol  1 drop Both Eyes Daily  . traZODone  50 mg Oral QHS  . Warfarin - Pharmacist Dosing Inpatient    Does not apply q1800   Continuous Infusions: . sodium chloride 75 mL/hr at 08/24/19 1537    Principal Problem:   Fall Active Problems:   Hypertension   Dementia ([place])   Leukocytosis   Left acetabular fracture ([facility])   Pelvic rami fracture (H[facility])   PAF (paroxysmal atrial fibrillation) ([place])   LOS: 1 day   A & P  Status post fall: Most likely secondary to gait abnormalities.  Could be mechanical but with patient's known history of atrial fibrillation and other cardiac causes could be arrhythmias.  Patient will be placed on telemetry.  Echocardiogram.  PT and OT consultation.  Left acetabular and pelvic rami fractures: Secondary to fall. Orthopedic consulted.  Continue pain management and care according to orthopedics. Non-operative management due to patient's age, dementia, and multiple comorbidities.  Dementia: Appears at baseline. Continue home regimen  Hypertension: Continue with home regimen  Atrial fibrillation: Paroxysmal in nature. Currently in sinus rhythm. Continue warfarin and rate control  I have seen and examined this patient myself. I have spent 35 minutes in her evaluation and care.  DVT prophylaxis: Warfarin Code Status: DNR Family Communication: Family over the phone Disposition Plan: To be determined  Mohamad Bruso, DO [facility] Direct contact: see [email or web address]  7PM-7AM contact night coverage as above 08/24/2019, 4:47 PM  LOS: 1 day

## 2019-08-24 NOTE — Plan of Care (Signed)
  Problem: Clinical Measurements: Goal: Respiratory complications will improve Outcome: Progressing Note: On room air   Problem: Nutrition: Goal: Adequate nutrition will be maintained Outcome: Progressing   Problem: Coping: Goal: Level of anxiety will decrease Outcome: Progressing   Problem: Pain Managment: Goal: General experience of comfort will improve Outcome: Progressing Note: Treated for pain twice once with tylenol, and once with toradol   Problem: Safety: Goal: Ability to remain free from injury will improve Outcome: Progressing Note: Safety mats at bedside   Problem: Skin Integrity: Goal: Risk for impaired skin integrity will decrease Outcome: Progressing

## 2019-08-24 NOTE — Plan of Care (Signed)

## 2019-08-25 LAB — BASIC METABOLIC PANEL
Anion gap: 9 (ref 5–15)
BUN: 25 mg/dL — ABNORMAL HIGH (ref 8–23)
CO2: 24 mmol/L (ref 22–32)
Calcium: 8.2 mg/dL — ABNORMAL LOW (ref 8.9–10.3)
Chloride: 104 mmol/L (ref 98–111)
Creatinine, Ser: 1.11 mg/dL — ABNORMAL HIGH (ref 0.44–1.00)
GFR calc Af Amer: 51 mL/min — ABNORMAL LOW (ref >60)
GFR calc non Af Amer: 44 mL/min — ABNORMAL LOW (ref >60)
Glucose, Bld: 115 mg/dL — ABNORMAL HIGH (ref 70–99)
Potassium: 3.6 mmol/L (ref 3.5–5.1)
Sodium: 137 mmol/L (ref 135–145)

## 2019-08-25 LAB — CBC WITH DIFFERENTIAL/PLATELET
Abs Immature Granulocytes: 0.07 10*3/uL (ref 0.00–0.07)
Basophils Absolute: 0 10*3/uL (ref 0.0–0.1)
Basophils Relative: 0 %
Eosinophils Absolute: 0.2 10*3/uL (ref 0.0–0.5)
Eosinophils Relative: 1 %
HCT: 25.9 % — ABNORMAL LOW (ref 36.0–46.0)
Hemoglobin: 7.9 g/dL — ABNORMAL LOW (ref 12.0–15.0)
Immature Granulocytes: 1 %
Lymphocytes Relative: 14 %
Lymphs Abs: 1.6 10*3/uL (ref 0.7–4.0)
MCH: 28 pg (ref 26.0–34.0)
MCHC: 30.5 g/dL (ref 30.0–36.0)
MCV: 91.8 fL (ref 80.0–100.0)
Monocytes Absolute: 1.3 10*3/uL — ABNORMAL HIGH (ref 0.1–1.0)
Monocytes Relative: 11 %
Neutro Abs: 8.7 10*3/uL — ABNORMAL HIGH (ref 1.7–7.7)
Neutrophils Relative %: 73 %
Platelets: 326 10*3/uL (ref 150–400)
RBC: 2.82 MIL/uL — ABNORMAL LOW (ref 3.87–5.11)
RDW: 14.6 % (ref 11.5–15.5)
WBC: 11.8 10*3/uL — ABNORMAL HIGH (ref 4.0–10.5)
nRBC: 0 % (ref 0.0–0.2)

## 2019-08-25 LAB — PROTIME-INR
INR: 3.1 — ABNORMAL HIGH (ref 0.8–1.2)
Prothrombin Time: 31.5 seconds — ABNORMAL HIGH (ref 11.4–15.2)

## 2019-08-25 NOTE — Progress Notes (Signed)
ANTICOAGULATION CONSULT NOTE - Follow Up Consult  Pharmacy Consult for Warfarin Indication: atrial fibrillation  Allergies  Allergen Reactions  . Ciprofloxacin Hcl Other (See Comments)    On MAR  . Effexor [Venlafaxine] Other (See Comments)    On MAR  . Gantrisin [Sulfisoxazole] Other (See Comments)    On MAR  . Levaquin [Levofloxacin] Other (See Comments)    On MAR  . Sulfa Antibiotics Rash    Patient Measurements: Height: 5\' 3"  (160 cm) Weight: 153 lb (69.4 kg) IBW/kg (Calculated) : 52.4  Vital Signs: Temp: 98.5 F (36.9 C) (10/15 0802) Temp Source: Oral (10/15 0802) BP: 129/63 (10/15 0802) Pulse Rate: 97 (10/15 0802)  Labs: Recent Labs    08/23/19 1744 08/23/19 2139 08/24/19 0319 08/25/19 0626  HGB 11.1*  --  9.6* 7.9*  HCT 36.0  --  31.3* 25.9*  PLT 415*  --  412* 326  LABPROT  --  21.1* 20.8* 31.5*  INR  --  1.9* 1.8* 3.1*  CREATININE 1.25*  --  1.21* 1.11*    Estimated Creatinine Clearance: 31.5 mL/min (A) (by C-G formula based on SCr of 1.11 mg/dL (H)).  Assessment:  85 y/o F from memory care facility with fall, CT head negative, on warfarin PTA for afib, continuing warfarin.  INR sub-therapeutic at 1.9 on admit 10/13 pm.  Planning non-operative management of L acetabular and pubic rami fractures.    INR up from 1.8 yesterday to 3.1 today. Hgb down 7.9. No bleeding reported.  Warfarin 3 mg x 1 given ~3am on 10/14.  Had received usual 2 mg dose at facility prior to admission on 10/13.   On Toradol 15 mg IV q6hr prn severe pain. Has had 4 doses in the last 24hrs. Tylenol for mild pain.      PTA regimen: 2 mg daily except 3 mg on Sundays, last dose 10/13  Goal of Therapy:  INR 2-3 Monitor platelets by anticoagulation protocol: Yes   Plan:   Hold warfarin today.  Daily PT/INR.  Monitor for any s/sx bleeding.  Watch prn Toradol use, renal function.  Consider adding GI prophylaxis while on Toradol?  Arty Baumgartner, [place] Pager: [phone] or  phone: [phone] 08/25/2019,1:36 PM

## 2019-08-25 NOTE — Plan of Care (Signed)

## 2019-08-25 NOTE — TOC Initial Note (Addendum)
Transition of Care [facility]) - Initial/Assessment Note    Patient Details  Name: Kendra Smith MRN: [id number] Date of Birth: 08-16-1926  [facility] CM/SW Contact:    Colleen Can RN, BSN, NCM-BC, ACM-RN [phone] Phone Number: 08/25/2019, 3:24 PM  Clinical Narrative:                 CM following for transitional needs. CM spoke to the patients daughter/POA, [name], via phone to discuss the POC d/t the patients cognitive decline. 85 y.o.femalewith medical history significant ofdementia, hypertension, paroxysmal atrial fibrillation on chronic anticoagulation, glaucoma, depression and hyperlipidemia who was brought in as secondary to a fall. Non operative per trauma due to dementia. Patient was a resident of [place] at [facility] PTA, but will need a higher LOC post-discharge with SNF recommended; patients daughter is agreeable to PT/OT recommendations prior to the patient returning back to [place]. FL2 and PASRR will be completed. CM team will continue to follow.   Expected Discharge Plan: Skilled Nursing Facility Barriers to Discharge: Continued Medical Work up, SNF Pending bed offer   Patient Goals and CMS Choice   CMS Medicare.gov Compare Post Acute Care list provided to:: Patient Represenative (must comment)(Linda Long (daughter/POA)) Choice offered to / list presented to : [facility] POA / Guardian(Linda Long (daughter/POA))  Expected Discharge Plan and Services Expected Discharge Plan: Skilled Nursing Facility In-house Referral: NA Discharge Planning Services: CM Consult Post Acute Care Choice: Skilled Nursing Facility Living arrangements for the past 2 months: Assisted Living Facility                 DME Arranged: N/A DME Agency: NA       HH Arranged: NA HH Agency: NA        Prior Living Arrangements/Services Living arrangements for the past 2 months: Assisted Living Facility Lives with:: Facility Resident Patient language and need for  interpreter reviewed:: Yes Do you feel safe going back to the place where you live?: Yes      Need for Family Participation in Patient Care: Yes (Comment) Care giver support system in place?: Yes (comment) Current home services: DME Criminal Activity/Legal Involvement Pertinent to Current Situation/Hospitalization: No - Comment as needed  Activities of Daily Living Home Assistive Devices/Equipment: Walker (specify type) ADL Screening (condition at time of admission) Patient's cognitive ability adequate to safely complete daily activities?: No Is the patient deaf or have difficulty hearing?: No Does the patient have difficulty seeing, even when wearing glasses/contacts?: No Does the patient have difficulty concentrating, remembering, or making decisions?: Yes Patient able to express need for assistance with ADLs?: No Does the patient have difficulty dressing or bathing?: Yes Independently performs ADLs?: No Communication: Independent Dressing (OT): Needs assistance Is this a change from baseline?: Pre-admission baseline Grooming: Needs assistance Is this a change from baseline?: Pre-admission baseline Feeding: Needs assistance Is this a change from baseline?: Pre-admission baseline Bathing: Needs assistance Is this a change from baseline?: Pre-admission baseline Toileting: Needs assistance Is this a change from baseline?: Pre-admission baseline In/Out Bed: Needs assistance Is this a change from baseline?: Pre-admission baseline Walks in Home: Needs assistance Is this a change from baseline?: Pre-admission baseline Does the patient have difficulty walking or climbing stairs?: Yes Weakness of Legs: Both Weakness of Arms/Hands: Both  Permission Sought/Granted Permission sought to share information with : Case Manager, Magazine features editor Permission granted to share information with : Yes, Verbal Permission Granted     Permission granted to share info w AGENCY: SNF  facilities        Emotional Assessment       Orientation: : Oriented to Self, Oriented to Place Alcohol / Substance Use: Not Applicable Psych Involvement: No (comment)  Admission diagnosis:  Fall [W19.XXXA] Fall at nursing home, initial encounter [phone], Y92.129] Closed fracture of ramus of left pubis, initial encounter ([place]) [S32.592A] Other closed fracture of left acetabulum, initial encounter [facility]) [S32.492A] Patient Active Problem List   Diagnosis Date Noted  . Left acetabular fracture ([place]) 08/23/2019  . Pelvic rami fracture ([place]) 08/23/2019  . PAF (paroxysmal atrial fibrillation) ([place]) 08/23/2019  . Rib fracture-12 01/12/2019  . Fracture of transverse process of lumbar vertebra L1-L2 01/12/2019  . Dementia ([place]) 01/12/2019  . Fall 01/12/2019  . Leukocytosis 01/12/2019  . Depression   . Hypertension    PCP:  Patient, No Pcp Per Pharmacy:   [facility] [place], [place] - [address] [place] [address] [place] [address] Phone: [phone] Fax: [phone]     Social Determinants of Health (SDOH) Interventions    Readmission Risk Interventions No flowsheet data found.

## 2019-08-25 NOTE — [place] FL2 (Signed)
[place] MEDICAID FL2 LEVEL OF CARE SCREENING TOOL     IDENTIFICATION  Patient Name: Kendra Smith Birthdate: June 20, 1926 Sex: female Admission Date (Current Location): 08/23/2019  [employer] [facility] and [place] Number:  Producer, television/film/video and Address:  [facility] [place], [address] [place], [place] [address]      Provider Number: [id number]  Attending Physician Name and Address:  Fran Lowes, DO  Relative Name and Phone Number:  [name] [phone]    Current Level of Care: Hospital Recommended Level of Care: Skilled Nursing Facility Prior Approval Number:    Date Approved/Denied:   PASRR Number: [id number]  Discharge Plan: SNF    Current Diagnoses: Patient Active Problem List   Diagnosis Date Noted  . Left acetabular fracture (HCC) 08/23/2019  . Pelvic rami fracture (HCC) 08/23/2019  . PAF (paroxysmal atrial fibrillation) (HCC) 08/23/2019  . Rib fracture-12 01/12/2019  . Fracture of transverse process of lumbar vertebra L1-L2 01/12/2019  . Dementia (HCC) 01/12/2019  . Fall 01/12/2019  . Leukocytosis 01/12/2019  . Depression   . Hypertension     Orientation RESPIRATION BLADDER Height & Weight     Self, Place  Normal Incontinent, External catheter Weight: 69.4 kg Height:  5\' 3"  (160 cm)  BEHAVIORAL SYMPTOMS/MOOD NEUROLOGICAL BOWEL NUTRITION STATUS  Other (Comment)(N/A) (N/A) Continent Diet  AMBULATORY STATUS COMMUNICATION OF NEEDS Skin   Extensive Assist Verbally Normal                       Personal Care Assistance Level of Assistance  Bathing, Feeding, Dressing Bathing Assistance: Maximum assistance Feeding assistance: Limited assistance Dressing Assistance: Maximum assistance     Functional Limitations Info  Sight, Hearing, Speech Sight Info: Impaired(Glaucoma history) Hearing Info: Adequate Speech Info: Adequate    SPECIAL CARE FACTORS FREQUENCY  PT (By licensed PT), OT (By licensed OT)     PT Frequency: Min  2X/week OT Frequency: Min 2X/week            Contractures Contractures Info: Not present    Additional Factors Info  Code Status, Allergies Code Status Info: DNR Allergies Info: Cipro, Effexor, Gantrisin, Levquin, Sulfa Antibiotics           Current Medications (08/25/2019):  This is the current hospital active medication list Current Facility-Administered Medications  Medication Dose Route Frequency Provider Last Rate Last Dose  . 0.9 %  sodium chloride infusion   Intravenous Continuous 08/27/2019, MD 75 mL/hr at 08/24/19 1537    . acetaminophen (TYLENOL) tablet 650 mg  650 mg Oral Q6H PRN 08/26/19, MD   650 mg at 08/25/19 0840   Or  . acetaminophen (TYLENOL) suppository 650 mg  650 mg Rectal Q6H PRN 08/27/19, MD      . calcium carbonate (OS-CAL - dosed in mg of elemental calcium) tablet 500 mg of elemental calcium  500 mg of elemental calcium Oral Q breakfast Rometta Emery L, MD   500 mg of elemental calcium at 08/25/19 0831  . cholecalciferol (VITAMIN D3) tablet 1,000 Units  1,000 Units Oral Daily 08/27/19, MD   1,000 Units at 08/25/19 0831  . digoxin (LANOXIN) tablet 0.125 mg  0.125 mg Oral Q M,W,F 08/27/19, Mohammad L, MD   0.125 mg at 08/24/19 1044  . donepezil (ARICEPT) tablet 5 mg  5 mg Oral QHS 08/26/19, MD   5 mg at 08/24/19 2132  . DULoxetine (CYMBALTA) DR capsule 60 mg  60 mg Oral Daily Gala Romney L, MD   60 mg at 08/25/19 0831  . furosemide (LASIX) tablet 20 mg  20 mg Oral Ervin Knack, MD   20 mg at 08/25/19 0831  . ketorolac (TORADOL) 15 MG/ML injection 15 mg  15 mg Intravenous Q6H PRN Elwyn Reach, MD   15 mg at 08/25/19 0841  . latanoprost (XALATAN) 0.005 % ophthalmic solution 1 drop  1 drop Both Eyes QHS Elwyn Reach, MD   1 drop at 08/24/19 2136  . ondansetron (ZOFRAN) tablet 4 mg  4 mg Oral Q6H PRN Elwyn Reach, MD       Or  . ondansetron (ZOFRAN) injection 4 mg  4 mg Intravenous Q6H PRN  Garba, Mohammad L, MD      . timolol (TIMOPTIC) 0.5 % ophthalmic solution 1 drop  1 drop Both Eyes Daily Elwyn Reach, MD   1 drop at 08/25/19 0831  . traZODone (DESYREL) tablet 50 mg  50 mg Oral QHS Elwyn Reach, MD   50 mg at 08/24/19 2132  . Warfarin - Pharmacist Dosing Inpatient   Does not apply q1800 [facility]ren[facility] Blank [facility]         Discharge Medications: Please see discharge summary for a list of discharge medications.  Relevant Imaging Results:  Relevant Lab Results:   Additional Information SSN: [id number]; Non-weight bearing LLE  Midge Minium RN, BSN, NCM-BC, ACM-RN [phone]

## 2019-08-25 NOTE — Discharge Instructions (Signed)

## 2019-08-25 NOTE — Progress Notes (Signed)
PROGRESS NOTE  Kendra Smith INO:[id number] DOB: Mar 22, 1926 DOA: 08/23/2019 PCP: Patient, No Pcp Per  Brief History    Kendra Smith is a 85 y.o. female with medical history significant of dementia, hypertension, paroxysmal atrial fibrillation on chronic anticoagulation, glaucoma, depression and hyperlipidemia who was brought in as secondary to a fall.  Patient was apparently walking around with her walker when she fell.  She felt weak in her legs and went down.  She did not hit her head.  She did not know if she tripped on anything.  She laid down on her left side and felt immediate pain.  She started having the pain on the left side and lower back.  She was brought into the ER where she was evaluated and found to have left acetabular fracture with some inferior pubic rami fractures and possible sacral fractures.  She is fully waited at the moment with no obvious loss of consciousness.  Orthopedics have been consulted and patient is being admitted to the hospital for treatment and evaluation.  Her pain is currently at 8 out of 10.  She has received multiple doses of morphine and is getting better.  She is being admitted with orthopedic follow-up.  ED Course: Temperature is 98.9 blood pressure 119/70 pulse 106 respiratory of 20 oxygen sat 87% on room air currently 92% on 2 L.  White count 15.8 hemoglobin 11.1 platelets 415.  Sodium 138 potassium 3.8 chloride 99 CO2 27 BUN 24 and creatinine of 1.25.  INR is 1.9 and glucose 149.  Full x-rays of the neck, pelvis chest done.  Most notable finding is acute comminuted left acetabular fracture, acute Camitta left inferior pubic ramus fracture and possible left superior pubic ramus fracture.  Also acute left sacral fracture near the inferior SI joint.  Orthopedic consulted and patient is being admitted for treatment.  Urinalysis showed positive nitrite but no symptoms of dysuria.  The patient was admitted to a telemetry bed, and orthopedic surgery has been  consulted. They have recommended non-operative management for her left acetabular fracture and left pubic rami fractures given her age and inability to follow strict weight bearing requirements. PT/OT is seeing her. She is pending placement.   Consultants  . Orthopedic surgery  Procedures  . None  Antibiotics   Anti-infectives (From admission, onward)   None     Subjective  The patient is being moved from the bed to a chair by PT and a nurses aide. She is having great difficulty moving and requires maximum assistance x 2.  Objective   Vitals:  Vitals:   08/25/19 0802 08/25/19 1340  BP: 129/63 125/60  Pulse: 97 96  Resp: 15 15  Temp: 98.5 F (36.9 C) 98.6 F (37 C)  SpO2: 95% 95%   Exam:  Constitutional:  . The patient is awake and alert. Moderate to severe distress from left hip and groin pain, especially with movement. Respiratory:  . No increased work of breathing. . No wheezes, rales, or rhonchi . No tactile fremitus Cardiovascular:  . Regular rate and rhythm . No murmurs, ectopy, or gallups. . No lateral PMI. No thrills. Abdomen:  . Abdomen is soft, non-tender, non-distended . No hernias, masses, or organomegaly . Normoactive bowel sounds.  Musculoskeletal:  . No cyanosis, clubbing, or edema Skin:  . No rashes, lesions, ulcers . palpation of skin: no induration or nodules Neurologic:  Unable to evaluate due to the patient's inability to cooperate with exam. Psychiatric:  . Unable to evaluate due to  the patient's inability to cooperate with exam.  I have personally reviewed the following:   Today's Data  . Vitals, CMP, CBC  Scheduled Meds: . calcium carbonate  500 mg of elemental calcium Oral Q breakfast  . cholecalciferol  1,000 Units Oral Daily  . digoxin  0.125 mg Oral Q M,W,F  . donepezil  5 mg Oral QHS  . DULoxetine  60 mg Oral Daily  . furosemide  20 mg Oral QODAY  . latanoprost  1 drop Both Eyes QHS  . timolol  1 drop Both Eyes Daily  .  traZODone  50 mg Oral QHS  . Warfarin - Pharmacist Dosing Inpatient   Does not apply q1800   Continuous Infusions: . sodium chloride 75 mL/hr at 08/24/19 1537    Principal Problem:   Fall Active Problems:   Hypertension   Dementia ([place])   Leukocytosis   Left acetabular fracture (HCC)   Pelvic rami fracture (HCC)   PAF (paroxysmal atrial fibrillation) ([place])   LOS: 2 days   A & P  Status post fall: Most likely secondary to gait abnormalities.  Could be mechanical but with patient's known history of atrial fibrillation and other cardiac causes could be arrhythmias.  Patient will be placed on telemetry.  Echocardiogram.  PT and OT consultation.  Left acetabular and pelvic rami fractures: Secondary to fall. Orthopedic consulted.  Continue pain management and care according to orthopedics. Non-operative management due to patient's age, dementia, and multiple comorbidities.  Dementia: Appears at baseline. Continue home regimen.  Hypertension: Continue with home regimen  Atrial fibrillation: Paroxysmal in nature. Currently in sinus rhythm. Continue warfarin and rate control.  I have seen and examined this patient myself. I have spent 32 minutes in her evaluation and care.  DVT prophylaxis: Warfarin Code Status: DNR Family Communication: Family over the phone Disposition Plan: Sub acute nursing facility  Ginna Schuur, DO [facility] Direct contact: see [email or web address]  7PM-7AM contact night coverage as above 08/25/2019, 2:59 PM  LOS: 1 day

## 2019-08-25 NOTE — Progress Notes (Signed)
Ortho Trauma Note  States her hip hurts. Neurovascularly intact. Pain with motion of hip and leg  A/P 85 year old female with displaced left acetabular fracture  Due to the high-level displacement, poor bone quality, anticoagulation and likely noncompliant with weightbearing precautions I feel that surgical intervention is high risk with minimal benefit.  I would recommend touchdown weightbearing immobilization as tolerated.  She will likely need to be bed to chair transfers.  I discussed her fracture and the treatment plan with the daughter this morning.  She is understanding of this and agreement with plan going forward.  We will plan to see her back in about 2 or 3 weeks as an outpatient for repeat radiographs.  Shona Needles, MD [facility] [phone] (phone) [phone] (office) [email or web address]

## 2019-08-25 NOTE — Evaluation (Signed)
Occupational Therapy Evaluation Patient Details Name: Kendra Smith MRN: [id number] DOB: 05-16-1926 Today's Date: 08/25/2019    History of Present Illness Kendra Smith is a 85 y.o. female with medical history significant of dementia, hypertension, paroxysmal atrial fibrillation on chronic anticoagulation, glaucoma, depression and hyperlipidemia who was brought in as secondary to a fall.  Patient was apparently walking around with her walker when she fell. She was brought into the ER where she was evaluated and found to have left acetabular fracture with some inferior pubic rami fractures.  Non operative per trauma due to dementia.    Clinical Impression   Pt with decline in function and safety with ADLs and ADL mobility with impaired strength, balance, endurance and hx of cognitive impairments. PTA, pt lived at an ALF and used a RW for mobility and had a PCA to assist with selfcare(per chart). Pt a poor historian and unable to provide accurate info on her PLOF. Pt currently requires min guard A with UB ADLs, total A with toileting and total A +2 with SPT to recliner/BSC. NT assisted OT with pt. Pt unable to understand that she is NWB on her L LE and is unable to maintain. Pt would benefit from acute OT services to address impairments to maximize level of function and safety    Follow Up Recommendations  SNF;Supervision/Assistance - 24 hour    Equipment Recommendations  None recommended by OT    Recommendations for Other Services       Precautions / Restrictions Precautions Precautions: Fall Restrictions Weight Bearing Restrictions: Yes LLE Weight Bearing: Non weight bearing      Mobility Bed Mobility Overal bed mobility: Needs Assistance Bed Mobility: Supine to Sit Rolling: Mod assist   Supine to sit: Mod assist     General bed mobility comments: used rails with increased time and effort. mod A to elevate trunk and to bring LEs to EOB  Transfers Overall transfer  level: Needs assistance   Transfers: Sit to/from [employer] Transfers Sit to Stand: Total assist;+2 physical assistance Stand pivot transfers: Total assist;+2 physical assistance       General transfer comment: pt unable to understand or maintian L LE NWB    Balance Overall balance assessment: Needs assistance;History of Falls Sitting-balance support: Bilateral upper extremity supported;Feet supported Sitting balance-Leahy Scale: Fair       Standing balance-Leahy Scale: Zero                             ADL either performed or assessed with clinical judgement   ADL Overall ADL's : Needs assistance/impaired Eating/Feeding: Independent;Sitting   Grooming: Wash/dry hands;Wash/dry face;Sitting;Min guard Grooming Details (indicate cue type and reason): cue for initiation and thoroughness Upper Body Bathing: Min guard;Sitting Upper Body Bathing Details (indicate cue type and reason): cue for initiation and thoroughness Lower Body Bathing: Maximal assistance;Sitting/lateral leans Lower Body Bathing Details (indicate cue type and reason): cue for initiation and thoroughness Upper Body Dressing : Min guard;Sitting Upper Body Dressing Details (indicate cue type and reason): cue for initiation and thoroughness Lower Body Dressing: Total assistance   Toilet Transfer: Total assistance;+2 for physical assistance;Cueing for safety;Cueing for sequencing;Stand-pivot   Toileting- Clothing Manipulation and Hygiene: Total assistance         General ADL Comments: pt unable to understand or maintian L LE NWB, required cues for initiation and thoroughness during bathing     Vision Patient Visual Report: No change from baseline  Perception     Praxis      Pertinent Vitals/Pain Pain Assessment: No/denies pain Faces Pain Scale: Hurts worst Pain Location: pelvis, left hip Pain Descriptors / Indicators: Aching;Grimacing;Guarding;Moaning Pain Intervention(s):  Limited activity within patient's tolerance;Monitored during session;Premedicated before session;Repositioned     Hand Dominance Right   Extremity/Trunk Assessment Upper Extremity Assessment Upper Extremity Assessment: Generalized weakness   Lower Extremity Assessment Lower Extremity Assessment: Defer to PT evaluation   Cervical / Trunk Assessment Cervical / Trunk Assessment: Kyphotic   Communication Communication Communication: No difficulties   Cognition Arousal/Alertness: Awake/alert Behavior During Therapy: Anxious;Restless Overall Cognitive Status: History of cognitive impairments - at baseline                                 General Comments: Hx of dementia   General Comments       Exercises     Shoulder Instructions      Home Living Family/patient expects to be discharged to:: Skilled nursing facility                                        Prior Functioning/Environment          Comments: [facility] - had assistance with adls from PCA        OT Problem List: Decreased strength;Impaired balance (sitting and/or standing);Decreased cognition;Decreased knowledge of precautions;Pain;Decreased safety awareness;Decreased activity tolerance;Decreased knowledge of use of DME or AE      OT Treatment/Interventions: Self-care/ADL training;DME and/or AE instruction;Therapeutic activities;Patient/family education    OT Goals(Current goals can be found in the care plan section) Acute Rehab OT Goals Patient Stated Goal: none stated OT Goal Formulation: With patient Time For Goal Achievement: 09/08/19 Potential to Achieve Goals: Good ADL Goals Pt Will Perform Grooming: with supervision;with set-up;sitting Pt Will Perform Upper Body Bathing: with supervision;with set-up Pt Will Perform Lower Body Bathing: with mod assist;sitting/lateral leans Pt Will Perform Upper Body Dressing: with supervision;with  set-up;sitting Pt Will Transfer to Toilet: with max assist;with +2 assist;stand pivot transfer;bedside commode Additional ADL Goal #1: Pt will complete bed mobility with min A to sit EOB for ADLs/selfcare tasks  OT Frequency: Min 2X/week   Barriers to D/C:            Co-evaluation              AM-PAC OT "6 Clicks" Daily Activity     Outcome Measure Help from another person eating meals?: None Help from another person taking care of personal grooming?: A Little Help from another person toileting, which includes using toliet, bedpan, or urinal?: Total Help from another person bathing (including washing, rinsing, drying)?: A Lot Help from another person to put on and taking off regular upper body clothing?: A Little Help from another person to put on and taking off regular lower body clothing?: Total 6 Click Score: 14   End of Session Equipment Utilized During Treatment: Gait belt  Activity Tolerance: No increased pain Patient left: in chair;with call bell/phone within reach;with chair alarm set;with nursing/sitter in room  OT Visit Diagnosis: Unsteadiness on feet (R26.81);Other abnormalities of gait and mobility (R26.89);Muscle weakness (generalized) (M62.81);History of falling (Z91.81);Other symptoms and signs involving cognitive function;Pain Pain - Right/Left: Left Pain - part of body: Hip;Leg(pelvis)  Time: 6629-4765 OT Time Calculation (min): 29 min Charges:  OT Evaluation $OT Eval Moderate Complexity: 1 Mod OT Treatments $Self Care/Home Management : 8-22 mins    Margaretmary Eddy [facility] 08/25/2019, 11:57 AM

## 2019-08-26 LAB — CBC WITH DIFFERENTIAL/PLATELET
Abs Immature Granulocytes: 0.15 10*3/uL — ABNORMAL HIGH (ref 0.00–0.07)
Basophils Absolute: 0 10*3/uL (ref 0.0–0.1)
Basophils Relative: 0 %
Eosinophils Absolute: 0.2 10*3/uL (ref 0.0–0.5)
Eosinophils Relative: 1 %
HCT: 25.3 % — ABNORMAL LOW (ref 36.0–46.0)
Hemoglobin: 7.8 g/dL — ABNORMAL LOW (ref 12.0–15.0)
Immature Granulocytes: 1 %
Lymphocytes Relative: 9 %
Lymphs Abs: 1.9 10*3/uL (ref 0.7–4.0)
MCH: 28.2 pg (ref 26.0–34.0)
MCHC: 30.8 g/dL (ref 30.0–36.0)
MCV: 91.3 fL (ref 80.0–100.0)
Monocytes Absolute: 1.7 10*3/uL — ABNORMAL HIGH (ref 0.1–1.0)
Monocytes Relative: 8 %
Neutro Abs: 17.4 10*3/uL — ABNORMAL HIGH (ref 1.7–7.7)
Neutrophils Relative %: 81 %
Platelets: 352 10*3/uL (ref 150–400)
RBC: 2.77 MIL/uL — ABNORMAL LOW (ref 3.87–5.11)
RDW: 14.6 % (ref 11.5–15.5)
WBC: 21.4 10*3/uL — ABNORMAL HIGH (ref 4.0–10.5)
nRBC: 0 % (ref 0.0–0.2)

## 2019-08-26 LAB — BASIC METABOLIC PANEL
Anion gap: 10 (ref 5–15)
BUN: 27 mg/dL — ABNORMAL HIGH (ref 8–23)
CO2: 23 mmol/L (ref 22–32)
Calcium: 8.2 mg/dL — ABNORMAL LOW (ref 8.9–10.3)
Chloride: 100 mmol/L (ref 98–111)
Creatinine, Ser: 1.08 mg/dL — ABNORMAL HIGH (ref 0.44–1.00)
GFR calc Af Amer: 52 mL/min — ABNORMAL LOW (ref >60)
GFR calc non Af Amer: 45 mL/min — ABNORMAL LOW (ref >60)
Glucose, Bld: 141 mg/dL — ABNORMAL HIGH (ref 70–99)
Potassium: 3.7 mmol/L (ref 3.5–5.1)
Sodium: 133 mmol/L — ABNORMAL LOW (ref 135–145)

## 2019-08-26 LAB — FERRITIN: Ferritin: 29 ng/mL (ref 11–307)

## 2019-08-26 LAB — IRON AND TIBC
Iron: 12 ug/dL — ABNORMAL LOW (ref 28–170)
Saturation Ratios: 4 % — ABNORMAL LOW (ref 10.4–31.8)
TIBC: 336 ug/dL (ref 250–450)
UIBC: 324 ug/dL

## 2019-08-26 LAB — PROTIME-INR
INR: 2.5 — ABNORMAL HIGH (ref 0.8–1.2)
Prothrombin Time: 26.3 seconds — ABNORMAL HIGH (ref 11.4–15.2)

## 2019-08-26 MED ORDER — WARFARIN SODIUM 3 MG PO TABS
3.0000 mg | ORAL_TABLET | Freq: Once | ORAL | Status: AC
  Administered 2019-08-26: 3 mg via ORAL
  Filled 2019-08-26: qty 1

## 2019-08-26 MED ORDER — MORPHINE SULFATE (PF) 2 MG/ML IV SOLN: 2.0000 mg | Freq: Once | INTRAVENOUS | Status: DC

## 2019-08-26 NOTE — Progress Notes (Addendum)
   08/26/19 0843  TOC Discharge Assessment  Patient chooses bed at [facility]   Patient has been accepted to [facility], the daughter's choice, pending bed availability.   Addendum: 08/26/19 @ 1636-Dashaun Onstott RNCM-CM spoke to [place] (Admissions) the facility has a bed available tomorrow. PTAR will be arranged.  Midge Minium RN, BSN, NCM-BC, ACM-RN [phone]

## 2019-08-26 NOTE — Progress Notes (Signed)
Pt has not had a BM since her admission on 10/13. No laxatives or stool softeners have been ordered. On Call MD notified of need for Stool softener/laxative.

## 2019-08-26 NOTE — Plan of Care (Signed)
°  Problem: Education: °Goal: Knowledge of General Education information will improve °Description: Including pain rating scale, medication(s)/side effects and non-pharmacologic comfort measures °Outcome: Progressing °  °Problem: Coping: °Goal: Level of anxiety will decrease °Outcome: Progressing °  °Problem: Elimination: °Goal: Will not experience complications related to bowel motility °Outcome: Progressing °  °Problem: Pain Managment: °Goal: General experience of comfort will improve °Outcome: Progressing °  °Problem: Safety: °Goal: Ability to remain free from injury will improve °Outcome: Progressing °  °

## 2019-08-26 NOTE — Plan of Care (Signed)
  Problem: Safety: Goal: Ability to remain free from injury will improve Outcome: Progressing   

## 2019-08-26 NOTE — Progress Notes (Signed)
Pt having severe pain when moved. She screams and tries to hit the staff when in pain. She was able to sleep for a few hours after troradol given. Pt was also disimpacted with a type 1 stool.

## 2019-08-26 NOTE — Plan of Care (Signed)
  Problem: Elimination: Goal: Will not experience complications related to bowel motility Outcome: Not Met (add Reason) No recorded bm

## 2019-08-26 NOTE — Progress Notes (Signed)
Physical Therapy Treatment Patient Details Name: Kendra Smith MRN: [id number] DOB: [birth year] Today's Date: 08/26/2019    History of Present Illness Kendra Smith is a 85 y.o. female with medical history significant of dementia, hypertension, paroxysmal atrial fibrillation on chronic anticoagulation, glaucoma, depression and hyperlipidemia who was brought in as secondary to a fall.  Patient was apparently walking around with her walker when she fell. She was brought into the ER where she was evaluated and found to have left acetabular fracture with some inferior pubic rami fractures.  Non operative per trauma due to dementia.     PT Comments    Pt admitted with above diagnosis. Pt was able to laterally scoot to the drop arm recliner with max assist of 2 with use of pads to assist scooting with pt assisting with her UEs to unweight hips.  Pt needs +2 assist overall for all aspects of mobility.  Pt is progressing and did tolerate session fairly well.  Left MAximove in chair for nursing to get pt back to bed.  Will continue to follow acutely.  Pt currently with functional limitations due to balance and endurance deficits. Pt will benefit from skilled PT to increase their independence and safety with mobility to allow discharge to the venue listed below.     Follow Up Recommendations  SNF;Supervision/Assistance - 24 hour     Equipment Recommendations  Other (comment)(TBA)    Recommendations for Other Services       Precautions / Restrictions Precautions Precautions: Fall Restrictions Weight Bearing Restrictions: Yes LLE Weight Bearing: Non weight bearing    Mobility  Bed Mobility Overal bed mobility: Needs Assistance Bed Mobility: Supine to Sit Rolling: Mod assist Sidelying to sit: Mod assist;+2 for physical assistance Supine to sit: Mod assist;+2 for physical assistance     General bed mobility comments: used rails with increased time and effort. mod A to elevate trunk and  to bring LEs to EOB, used pad to scoot pt out to EOB  Transfers Overall transfer level: Needs assistance   Transfers: Lateral/Scoot Transfers          Lateral/Scoot Transfers: Max assist;+2 physical assistance;From elevated surface General transfer comment: pt unable to understand or maintian L LE NWB.  Decided to perform lateral scoot to chair with pt able to help a little by pushing up on UEs to unweight buttocks.  PT and tech used pad to scoot pt over to drop arm recliner.  Left pt on Maximove pad for nursing to get pt back.   Ambulation/Gait             General Gait Details: unable   Stairs             Wheelchair Mobility    Modified Rankin (Stroke Patients Only)       Balance Overall balance assessment: Needs assistance;History of Falls Sitting-balance support: Bilateral upper extremity supported;Feet supported Sitting balance-Leahy Scale: Fair Sitting balance - Comments: relies on UEs support. Initially leaning to her left in sitting but eventually she got her baalance.  Postural control: Left lateral lean                                  Cognition Arousal/Alertness: Awake/alert Behavior During Therapy: Anxious;Restless Overall Cognitive Status: History of cognitive impairments - at baseline  General Comments: Hx of dementia      Exercises General Exercises - Lower Extremity Ankle Circles/Pumps: AROM;Both;5 reps;Supine Long Arc Quad: AROM;Both;10 reps;Seated Heel Slides: AAROM;Both;Supine;10 reps    General Comments        Pertinent Vitals/Pain Pain Assessment: 0-10 Pain Score: 10-Worst pain ever Pain Location: pelvis, left hip Pain Descriptors / Indicators: Aching;Grimacing;Guarding;Moaning Pain Intervention(s): Limited activity within patient's tolerance;Monitored during session;Repositioned    Home Living                      Prior Function            PT Goals  (current goals can now be found in the care plan section) Acute Rehab PT Goals Patient Stated Goal: none stated Progress towards PT goals: Progressing toward goals    Frequency    Min 2X/week      PT Plan Current plan remains appropriate    Co-evaluation              AM-PAC PT "6 Clicks" Mobility   Outcome Measure  Help needed turning from your back to your side while in a flat bed without using bedrails?: A Little Help needed moving from lying on your back to sitting on the side of a flat bed without using bedrails?: A Lot Help needed moving to and from a bed to a chair (including a wheelchair)?: Total Help needed standing up from a chair using your arms (e.g., wheelchair or bedside chair)?: Total Help needed to walk in hospital room?: Total Help needed climbing 3-5 steps with a railing? : Total 6 Click Score: 9    End of Session Equipment Utilized During Treatment: Gait belt Activity Tolerance: Patient limited by fatigue;Patient limited by pain Patient left: with call bell/phone within reach;in chair;with chair alarm set Nurse Communication: Mobility status;Need for lift equipment PT Visit Diagnosis: Muscle weakness (generalized) (M62.81);Repeated falls (R29.6);Pain Pain - Right/Left: Left Pain - part of body: Leg     Time: 7902-4097 PT Time Calculation (min) (ACUTE ONLY): 21 min  Charges:  $Therapeutic Activity: 8-22 mins                     Ishmail Mcmanamon,PT Acute Rehabilitation Services Pager:  [phone]  Office:  [phone]     Berline Lopes 08/26/2019, 10:04 AM

## 2019-08-26 NOTE — Progress Notes (Addendum)
ANTICOAGULATION CONSULT NOTE - Follow Up Consult  Pharmacy Consult for Warfarin Indication: pAfib (CHADSVASc = 4)    Allergies  Allergen Reactions  . Ciprofloxacin Hcl Other (See Comments)    On MAR  . Effexor [Venlafaxine] Other (See Comments)    On MAR  . Gantrisin [Sulfisoxazole] Other (See Comments)    On MAR  . Levaquin [Levofloxacin] Other (See Comments)    On MAR  . Sulfa Antibiotics Rash    Patient Measurements: Height: 5\' 3"  (160 cm) Weight: 153 lb (69.4 kg) IBW/kg (Calculated) : 52.4  Vital Signs: Temp: 98 F (36.7 C) (10/16 0812) Temp Source: Oral (10/16 0812) BP: 122/67 (10/16 0812) Pulse Rate: 61 (10/16 0812)  Labs: Recent Labs    08/24/19 0319 08/25/19 0626 08/26/19 0522  HGB 9.6* 7.9* 7.8*  HCT 31.3* 25.9* 25.3*  PLT 412* 326 352  LABPROT 20.8* 31.5* 26.3*  INR 1.8* 3.1* 2.5*  CREATININE 1.21* 1.11* 1.08*    Estimated Creatinine Clearance: 32.4 mL/min (A) (by C-G formula based on SCr of 1.08 mg/dL (H)).   Assessment: 85 y/o F from memory care facility with fall, CT head negative, on warfarin PTA for afib, continuing warfarin. Planning non-operative management of L acetabular and pubic rami fractures. Warfarin 2mg  given at facility> INR 1.9 on admit 10/13 pm > warfarin 3mg  at 3am > INR 3.1 10/15 > held > INR 1.8 10/16. Hgb 11.1 > 7.8 likely dry on admit, received NS 10/14, no documented bleeding.    PTA regimen: 2 mg daily except 3 mg on Fridays   Goal of Therapy:  INR 2-3 Monitor platelets by anticoagulation protocol: Yes   Plan:  -Warfarin 3mg  x1 given dose held yesterday and INR downtrend  -Daily INR   Donnamae Jude 08/26/2019,9:59 AM

## 2019-08-26 NOTE — Progress Notes (Signed)
PROGRESS NOTE  Warda Mcqueary KWI:[id number] DOB: 1926/09/02 DOA: 08/23/2019 PCP: Patient, No Pcp Per  Brief History    Enzley Kitchens is a 85 y.o. female with medical history significant of dementia, hypertension, paroxysmal atrial fibrillation on chronic anticoagulation, glaucoma, depression and hyperlipidemia who was brought in as secondary to a fall.  Patient was apparently walking around with her walker when she fell.  She felt weak in her legs and went down.  She did not hit her head.  She did not know if she tripped on anything.  She laid down on her left side and felt immediate pain.  She started having the pain on the left side and lower back.  She was brought into the ER where she was evaluated and found to have left acetabular fracture with some inferior pubic rami fractures and possible sacral fractures.  She is fully waited at the moment with no obvious loss of consciousness.  Orthopedics have been consulted and patient is being admitted to the hospital for treatment and evaluation.  Her pain is currently at 8 out of 10.  She has received multiple doses of morphine and is getting better.  She is being admitted with orthopedic follow-up.  ED Course: Temperature is 98.9 blood pressure 119/70 pulse 106 respiratory of 20 oxygen sat 87% on room air currently 92% on 2 L.  White count 15.8 hemoglobin 11.1 platelets 415.  Sodium 138 potassium 3.8 chloride 99 CO2 27 BUN 24 and creatinine of 1.25.  INR is 1.9 and glucose 149.  Full x-rays of the neck, pelvis chest done.  Most notable finding is acute comminuted left acetabular fracture, acute Camitta left inferior pubic ramus fracture and possible left superior pubic ramus fracture.  Also acute left sacral fracture near the inferior SI joint.  Orthopedic consulted and patient is being admitted for treatment.  Urinalysis showed positive nitrite but no symptoms of dysuria.  The patient was admitted to a telemetry bed, and orthopedic surgery has been  consulted. They have recommended non-operative management for her left acetabular fracture and left pubic rami fractures given her age and inability to follow strict weight bearing requirements. PT/OT is seeing her. She is awaiting bed availability at [facility].  Consultants  . Orthopedic surgery  Procedures  . None  Antibiotics   Anti-infectives (From admission, onward)   None     Subjective  The patient is sitting up at bedside. No new complaints.  Objective   Vitals:  Vitals:   08/26/19 0812 08/26/19 1452  BP: 122/67 116/66  Pulse: 61 92  Resp: 17 17  Temp: 98 F (36.7 C) 98.1 F (36.7 C)  SpO2: (!) 80% 96%   Exam:  Constitutional:  . The patient is awake and alert. No acute distress. Respiratory:  . No increased work of breathing. . No wheezes, rales, or rhonchi . No tactile fremitus Cardiovascular:  . Regular rate and rhythm . No murmurs, ectopy, or gallups. . No lateral PMI. No thrills. Abdomen:  . Abdomen is soft, non-tender, non-distended . No hernias, masses, or organomegaly . Normoactive bowel sounds.  Musculoskeletal:  . No cyanosis, clubbing, or edema Skin:  . No rashes, lesions, ulcers . palpation of skin: no induration or nodules Neurologic:  Unable to evaluate due to the patient's inability to cooperate with exam. Psychiatric:  . Unable to evaluate due to the patient's inability to cooperate with exam.  I have personally reviewed the following:   Today's Data  . Vitals, CMP, CBC  Scheduled Meds: . calcium carbonate  500 mg of elemental calcium Oral Q breakfast  . cholecalciferol  1,000 Units Oral Daily  . digoxin  0.125 mg Oral Q M,W,F  . donepezil  5 mg Oral QHS  . DULoxetine  60 mg Oral Daily  . furosemide  20 mg Oral QODAY  . latanoprost  1 drop Both Eyes QHS  .  morphine injection  2 mg Intravenous Once  . timolol  1 drop Both Eyes Daily  . traZODone  50 mg Oral QHS  . warfarin  3 mg Oral ONCE-1800  .  Warfarin - Pharmacist Dosing Inpatient   Does not apply q1800   Continuous Infusions: . sodium chloride 75 mL/hr at 08/24/19 1537    Principal Problem:   Fall Active Problems:   Hypertension   Dementia (HCC)   Leukocytosis   Left acetabular fracture (HCC)   Pelvic rami fracture (HCC)   PAF (paroxysmal atrial fibrillation) (HCC)   LOS: 3 days   A & P  Status post fall: Most likely secondary to gait abnormalities.  Could be mechanical but with patient's known history of atrial fibrillation and other cardiac causes could be arrhythmias.  Patient will be placed on telemetry.  Echocardiogram.  PT and OT consultation hav recommended SNF placement. She is awaiting bed availability at [facility].  Left acetabular and pelvic rami fractures: Secondary to fall. Orthopedic consulted.  Continue pain management and care according to orthopedics. Non-operative management due to patient's age, dementia, and multiple comorbidities.  Dementia: Appears at baseline. Continue home regimen.  Hypertension: Continue with home regimen  Atrial fibrillation: Paroxysmal in nature. Currently in sinus rhythm. Continue warfarin and rate control.  I have seen and examined this patient myself. I have spent 30 minutes in her evaluation and care.  DVT prophylaxis: Warfarin Code Status: DNR Family Communication: Family over the phone Disposition Plan: She is awaiting bed availability at [facility].   Rashmi Tallent, DO [facility] Direct contact: see [email or web address]  7PM-7AM contact night coverage as above 08/26/2019, 4:32 PM  LOS: 1 day

## 2019-08-27 DIAGNOSIS — Z881 Allergy status to other antibiotic agents status: Secondary | ICD-10-CM | POA: Diagnosis not present

## 2019-08-27 DIAGNOSIS — I959 Hypotension, unspecified: Secondary | ICD-10-CM | POA: Diagnosis not present

## 2019-08-27 DIAGNOSIS — F29 Unspecified psychosis not due to a substance or known physiological condition: Secondary | ICD-10-CM | POA: Diagnosis not present

## 2019-08-27 DIAGNOSIS — Z7401 Bed confinement status: Secondary | ICD-10-CM | POA: Diagnosis not present

## 2019-08-27 DIAGNOSIS — Z888 Allergy status to other drugs, medicaments and biological substances status: Secondary | ICD-10-CM | POA: Diagnosis not present

## 2019-08-27 DIAGNOSIS — H409 Unspecified glaucoma: Secondary | ICD-10-CM | POA: Diagnosis present

## 2019-08-27 DIAGNOSIS — R000 Tachycardia, unspecified: Secondary | ICD-10-CM | POA: Diagnosis not present

## 2019-08-27 DIAGNOSIS — I4891 Unspecified atrial fibrillation: Secondary | ICD-10-CM | POA: Diagnosis not present

## 2019-08-27 DIAGNOSIS — Z6827 Body mass index (BMI) 27.0-27.9, adult: Secondary | ICD-10-CM | POA: Diagnosis not present

## 2019-08-27 DIAGNOSIS — U071 COVID-19: Secondary | ICD-10-CM | POA: Diagnosis not present

## 2019-08-27 DIAGNOSIS — R531 Weakness: Secondary | ICD-10-CM | POA: Diagnosis not present

## 2019-08-27 DIAGNOSIS — S32452A Displaced transverse fracture of left acetabulum, initial encounter for closed fracture: Secondary | ICD-10-CM | POA: Diagnosis not present

## 2019-08-27 DIAGNOSIS — D72829 Elevated white blood cell count, unspecified: Secondary | ICD-10-CM | POA: Diagnosis not present

## 2019-08-27 DIAGNOSIS — M2550 Pain in unspecified joint: Secondary | ICD-10-CM | POA: Diagnosis not present

## 2019-08-27 DIAGNOSIS — F331 Major depressive disorder, recurrent, moderate: Secondary | ICD-10-CM | POA: Diagnosis not present

## 2019-08-27 DIAGNOSIS — F329 Major depressive disorder, single episode, unspecified: Secondary | ICD-10-CM | POA: Diagnosis present

## 2019-08-27 DIAGNOSIS — I248 Other forms of acute ischemic heart disease: Secondary | ICD-10-CM | POA: Diagnosis not present

## 2019-08-27 DIAGNOSIS — D62 Acute posthemorrhagic anemia: Secondary | ICD-10-CM | POA: Diagnosis present

## 2019-08-27 DIAGNOSIS — R4182 Altered mental status, unspecified: Secondary | ICD-10-CM | POA: Diagnosis not present

## 2019-08-27 DIAGNOSIS — R0902 Hypoxemia: Secondary | ICD-10-CM | POA: Diagnosis not present

## 2019-08-27 DIAGNOSIS — N3000 Acute cystitis without hematuria: Secondary | ICD-10-CM | POA: Diagnosis not present

## 2019-08-27 DIAGNOSIS — R627 Adult failure to thrive: Secondary | ICD-10-CM | POA: Diagnosis not present

## 2019-08-27 DIAGNOSIS — R7889 Finding of other specified substances, not normally found in blood: Secondary | ICD-10-CM | POA: Diagnosis not present

## 2019-08-27 DIAGNOSIS — F0390 Unspecified dementia without behavioral disturbance: Secondary | ICD-10-CM | POA: Diagnosis not present

## 2019-08-27 DIAGNOSIS — Z882 Allergy status to sulfonamides status: Secondary | ICD-10-CM | POA: Diagnosis not present

## 2019-08-27 DIAGNOSIS — M810 Age-related osteoporosis without current pathological fracture: Secondary | ICD-10-CM | POA: Diagnosis present

## 2019-08-27 DIAGNOSIS — S329XXD Fracture of unspecified parts of lumbosacral spine and pelvis, subsequent encounter for fracture with routine healing: Secondary | ICD-10-CM | POA: Diagnosis not present

## 2019-08-27 DIAGNOSIS — R52 Pain, unspecified: Secondary | ICD-10-CM | POA: Diagnosis not present

## 2019-08-27 DIAGNOSIS — I480 Paroxysmal atrial fibrillation: Secondary | ICD-10-CM | POA: Diagnosis not present

## 2019-08-27 DIAGNOSIS — S32402A Unspecified fracture of left acetabulum, initial encounter for closed fracture: Secondary | ICD-10-CM | POA: Diagnosis not present

## 2019-08-27 DIAGNOSIS — Z66 Do not resuscitate: Secondary | ICD-10-CM | POA: Diagnosis not present

## 2019-08-27 DIAGNOSIS — I499 Cardiac arrhythmia, unspecified: Secondary | ICD-10-CM | POA: Diagnosis not present

## 2019-08-27 DIAGNOSIS — W19XXXD Unspecified fall, subsequent encounter: Secondary | ICD-10-CM | POA: Diagnosis not present

## 2019-08-27 DIAGNOSIS — K921 Melena: Secondary | ICD-10-CM | POA: Diagnosis present

## 2019-08-27 DIAGNOSIS — K922 Gastrointestinal hemorrhage, unspecified: Secondary | ICD-10-CM | POA: Diagnosis not present

## 2019-08-27 DIAGNOSIS — I10 Essential (primary) hypertension: Secondary | ICD-10-CM | POA: Diagnosis not present

## 2019-08-27 DIAGNOSIS — R296 Repeated falls: Secondary | ICD-10-CM | POA: Diagnosis present

## 2019-08-27 DIAGNOSIS — Z515 Encounter for palliative care: Secondary | ICD-10-CM | POA: Diagnosis not present

## 2019-08-27 DIAGNOSIS — E872 Acidosis: Secondary | ICD-10-CM | POA: Diagnosis present

## 2019-08-27 DIAGNOSIS — E785 Hyperlipidemia, unspecified: Secondary | ICD-10-CM | POA: Diagnosis present

## 2019-08-27 DIAGNOSIS — Z7901 Long term (current) use of anticoagulants: Secondary | ICD-10-CM | POA: Diagnosis not present

## 2019-08-27 DIAGNOSIS — D509 Iron deficiency anemia, unspecified: Secondary | ICD-10-CM | POA: Diagnosis present

## 2019-08-27 DIAGNOSIS — E876 Hypokalemia: Secondary | ICD-10-CM | POA: Diagnosis present

## 2019-08-27 DIAGNOSIS — N390 Urinary tract infection, site not specified: Secondary | ICD-10-CM | POA: Diagnosis present

## 2019-08-27 LAB — PROTIME-INR
INR: 2.2 — ABNORMAL HIGH (ref 0.8–1.2)
Prothrombin Time: 24.5 seconds — ABNORMAL HIGH (ref 11.4–15.2)

## 2019-08-27 MED ORDER — WARFARIN SODIUM 3 MG PO TABS
3.0000 mg | ORAL_TABLET | Freq: Once | ORAL | Status: DC
  Filled 2019-08-27: qty 1

## 2019-08-27 MED ORDER — SODIUM CHLORIDE 0.9 % IV SOLN
510.0000 mg | Freq: Once | INTRAVENOUS | Status: AC
  Administered 2019-08-27: 510 mg via INTRAVENOUS
  Filled 2019-08-27: qty 17

## 2019-08-27 NOTE — Discharge Summary (Signed)
Physician Discharge Summary  Elley Harp WUJ:[id number] DOB: 06/07/1926 DOA: 08/23/2019  PCP: Patient, No Pcp Per  Admit date: 08/23/2019 Discharge date: 08/27/2019  Recommendations for Outpatient Follow-up:  1. Follow up with PCP in 7-10 days after discharge from SNF 2. Follow up with orthopedic surgery within one month 3. Check PT/INR on 08/30/2019. Report results to facility physician.  Follow-up Information    [facility] Preferred SNF Follow up.   Specialty: Skilled Nursing Facility Why: rehab Contact information: [address] [place] [address] [phone]         Discharge Diagnoses: Principal diagnosis is #1 1. Left acetabular fracture 2. Cominutted pubic rami fracture 3. Advanced dementia 4. Hypertension' 5. Tobacco abuse   Discharge Condition: Fair Disposition: SNF  Diet recommendation: Heart healthy  Filed Weights   08/24/19 1422  Weight: 69.4 kg    History of present illness:   Fredna Stricker is a 85 y.o. female with medical history significant of dementia, hypertension, paroxysmal atrial fibrillation on chronic anticoagulation, glaucoma, depression and hyperlipidemia who was brought in as secondary to a fall.  Patient was apparently walking around with her walker when she fell.  She felt weak in her legs and went down.  She did not hit her head.  She did not know if she tripped on anything.  She laid down on her left side and felt immediate pain.  She started having the pain on the left side and lower back.  She was brought into the ER where she was evaluated and found to have left acetabular fracture with some inferior pubic rami fractures and possible sacral fractures.  She is fully waited at the moment with no obvious loss of consciousness.  Orthopedics have been consulted and patient is being admitted to the hospital for treatment and evaluation.  Her pain is currently at 8 out of 10.  She has received  multiple doses of morphine and is getting better.  She is being admitted with orthopedic follow-up.  ED Course: Temperature is 98.9 blood pressure 119/70 pulse 106 respiratory of 20 oxygen sat 87% on room air currently 92% on 2 L.  White count 15.8 hemoglobin 11.1 platelets 415.  Sodium 138 potassium 3.8 chloride 99 CO2 27 BUN 24 and creatinine of 1.25.  INR is 1.9 and glucose 149.  Full x-rays of the neck, pelvis chest done.  Most notable finding is acute comminuted left acetabular fracture, acute Camitta left inferior pubic ramus fracture and possible left superior pubic ramus fracture.  Also acute left sacral fracture near the inferior SI joint.  Orthopedic consulted and patient is being admitted for treatment.  Urinalysis showed positive nitrite but no symptoms of dysuria.  Hospital Course:  The patient was admitted to a telemetry bed, and orthopedic surgery has been consulted. They have recommended non-operative management for her left acetabular fracture and left pubic rami fractures given her age and inability to follow strict weight bearing requirements. PT/OT is seeing her. She has received 510 mg of Feraheme infusion for severe iron deficiency anemia. She will be discharged to SNF  Today's assessment: S: The patient is resting comfortably. No new complaints. O: Vitals:  Vitals:   08/27/19 0453 08/27/19 0745  BP: (!) 119/57 (!) 107/59  Pulse: 96 97  Resp: 20 14  Temp: 98.4 F (36.9 C) 97.7 F (36.5 C)  SpO2: 93% 98%   Constitutional:   The patient is awake and alert. No acute distress. Respiratory:   No increased work of breathing.  No wheezes, rales, or rhonchi  No tactile fremitus Cardiovascular:   Regular rate and rhythm  No murmurs, ectopy, or gallups.  No lateral PMI. No thrills. Abdomen:   Abdomen is soft, non-tender, non-distended  No hernias, masses, or organomegaly  Normoactive bowel sounds.  Musculoskeletal:   No cyanosis, clubbing, or edema Skin:     No rashes, lesions, ulcers  palpation of skin: no induration or nodules Neurologic:  Unable to evaluate due to the patient's inability to cooperate with exam. Psychiatric:   Unable to evaluate due to the patient's inability to cooperate with exam.  Discharge Instructions  Discharge Instructions    Activity as tolerated - No restrictions   Complete by: As directed    Call MD for:  persistant nausea and vomiting   Complete by: As directed    Call MD for:  severe uncontrolled pain   Complete by: As directed    Diet - low sodium heart healthy   Complete by: As directed    Discharge instructions   Complete by: As directed    Follow up with PCP in 7-10 days after discharge from SNF. Check INR on 08/30/2019 and report to facility physician. Follow up with Orthopedic surgery within 1 month.   Increase activity slowly   Complete by: As directed      Allergies as of 08/27/2019      Reactions   Ciprofloxacin Hcl Other (See Comments)   On MAR   Effexor [venlafaxine] Other (See Comments)   On MAR   Gantrisin [sulfisoxazole] Other (See Comments)   On MAR   Levaquin [levofloxacin] Other (See Comments)   On MAR   Sulfa Antibiotics Rash      Medication List    TAKE these medications   acetaminophen 500 MG tablet Commonly known as: TYLENOL Take 1,000 mg by mouth every 6 (six) hours as needed for mild pain.   calcium carbonate 1500 (600 Ca) MG Tabs tablet Commonly known as: OSCAL Take 600 mg of elemental calcium by mouth daily with breakfast.   cholecalciferol 25 MCG (1000 UT) tablet Commonly known as: VITAMIN D3 Take 1,000 Units by mouth daily.   digoxin 0.125 MG tablet Commonly known as: LANOXIN Take 0.125 mg by mouth every other day. Mon, Wed, Fri only   donepezil 5 MG tablet Commonly known as: ARICEPT Take 5 mg by mouth at bedtime.   DULoxetine 60 MG capsule Commonly known as: CYMBALTA Take 60 mg by mouth daily.   furosemide 40 MG tablet Commonly known as:  LASIX Take 20 mg by mouth every other day.   latanoprost 0.005 % ophthalmic solution Commonly known as: XALATAN Place 1 drop into both eyes at bedtime.   timolol 0.5 % ophthalmic solution Commonly known as: TIMOPTIC Place 1 drop into both eyes daily.   traZODone 50 MG tablet Commonly known as: DESYREL Take 50 mg by mouth at bedtime.   warfarin 2 MG tablet Commonly known as: COUMADIN Take 2-3 mg by mouth See admin instructions. Take one tablet by mouth daily except on Friday take 1 and 1/2 tablet by mouth       Allergies  Allergen Reactions   Ciprofloxacin Hcl Other (See Comments)    On MAR   Effexor [Venlafaxine] Other (See Comments)    On MAR   Gantrisin [Sulfisoxazole] Other (See Comments)    On MAR   Levaquin [Levofloxacin] Other (See Comments)    On MAR   Sulfa Antibiotics Rash    The results of significant  diagnostics from this hospitalization (including imaging, microbiology, ancillary and laboratory) are listed below for reference.    Significant Diagnostic Studies: Dg Chest 1 View  Result Date: 08/23/2019 CLINICAL DATA:  Fall with hip fracture EXAM: CHEST  1 VIEW COMPARISON:  06/29/2014, CT 01/12/2019 FINDINGS: Mild a pickle pleural and parenchymal scarring. Mild scarring at the left base. No acute airspace disease or effusion. Normal cardiomediastinal silhouette with aortic atherosclerosis. No pneumothorax. IMPRESSION: No active disease. Electronically Signed   By: Jasmine Pang M.D.   On: 08/23/2019 19:13   Ct Head Wo Contrast  Result Date: 08/23/2019 CLINICAL DATA:  85 year old female with history of trauma from a fall. EXAM: CT HEAD WITHOUT CONTRAST CT CERVICAL SPINE WITHOUT CONTRAST TECHNIQUE: Multidetector CT imaging of the head and cervical spine was performed following the standard protocol without intravenous contrast. Multiplanar CT image reconstructions of the cervical spine were also generated. COMPARISON:  Head CT 05/11/2019. FINDINGS: CT HEAD  FINDINGS Brain: Physiologic calcifications in the right basal ganglia. Moderate cerebral atrophy. Patchy and confluent areas of decreased attenuation are noted throughout the deep and periventricular white matter of the cerebral hemispheres bilaterally, compatible with chronic microvascular ischemic disease. No evidence of acute infarction, hemorrhage, hydrocephalus, extra-axial collection or mass lesion/mass effect. Vascular: No hyperdense vessel or unexpected calcification. Skull: Normal. Negative for fracture or focal lesion. Sinuses/Orbits: No acute finding. Other: None. CT CERVICAL SPINE FINDINGS Comment: Study is limited by considerable patient motion. Alignment: Normal. Skull base and vertebrae: No acute fracture. No primary bone lesion or focal pathologic process. Soft tissues and spinal canal: No prevertebral fluid or swelling. No visible canal hematoma. Disc levels: Multilevel degenerative disc disease, most pronounced at C5-C6. Mild multilevel facet arthropathy. Upper chest: Calcified area of pleuroparenchymal thickening in the apex of the left hemithorax, most compatible with chronic post infectious or inflammatory scarring. Other: None. IMPRESSION: 1. No evidence of significant acute traumatic injury to the skull, brain or cervical spine. 2. Moderate cerebral atrophy with extensive chronic microvascular ischemic changes in the cerebral white matter, as above. 3. Mild multilevel degenerative disc disease and cervical spondylosis, as above. Electronically Signed   By: Trudie Reed M.D.   On: 08/23/2019 19:50   Ct Cervical Spine Wo Contrast  Result Date: 08/23/2019 CLINICAL DATA:  85 year old female with history of trauma from a fall. EXAM: CT HEAD WITHOUT CONTRAST CT CERVICAL SPINE WITHOUT CONTRAST TECHNIQUE: Multidetector CT imaging of the head and cervical spine was performed following the standard protocol without intravenous contrast. Multiplanar CT image reconstructions of the cervical spine  were also generated. COMPARISON:  Head CT 05/11/2019. FINDINGS: CT HEAD FINDINGS Brain: Physiologic calcifications in the right basal ganglia. Moderate cerebral atrophy. Patchy and confluent areas of decreased attenuation are noted throughout the deep and periventricular white matter of the cerebral hemispheres bilaterally, compatible with chronic microvascular ischemic disease. No evidence of acute infarction, hemorrhage, hydrocephalus, extra-axial collection or mass lesion/mass effect. Vascular: No hyperdense vessel or unexpected calcification. Skull: Normal. Negative for fracture or focal lesion. Sinuses/Orbits: No acute finding. Other: None. CT CERVICAL SPINE FINDINGS Comment: Study is limited by considerable patient motion. Alignment: Normal. Skull base and vertebrae: No acute fracture. No primary bone lesion or focal pathologic process. Soft tissues and spinal canal: No prevertebral fluid or swelling. No visible canal hematoma. Disc levels: Multilevel degenerative disc disease, most pronounced at C5-C6. Mild multilevel facet arthropathy. Upper chest: Calcified area of pleuroparenchymal thickening in the apex of the left hemithorax, most compatible with chronic post infectious or inflammatory  scarring. Other: None. IMPRESSION: 1. No evidence of significant acute traumatic injury to the skull, brain or cervical spine. 2. Moderate cerebral atrophy with extensive chronic microvascular ischemic changes in the cerebral white matter, as above. 3. Mild multilevel degenerative disc disease and cervical spondylosis, as above. Electronically Signed   By: Vinnie Langton M.D.   On: 08/23/2019 19:50   Ct Thoracic Spine Wo Contrast  Result Date: 08/23/2019 CLINICAL DATA:  85 year old female status post fall with back pain. EXAM: CT THORACIC SPINE WITHOUT CONTRAST TECHNIQUE: Multidetector CT images of the thoracic were obtained using the standard protocol without intravenous contrast. COMPARISON:  Lumbar spine CT today  reported separately. FINDINGS: Limited cervical spine imaging: Visible lower cervical vertebrae appear grossly intact. Cervicothoracic junction alignment is within normal limits. Thoracic spine segmentation:  Normal. Alignment: Mildly exaggerated thoracic kyphosis. No spondylolisthesis. Vertebrae: Osteopenia. Benign bone island in the posterior right T3 body suspected. No acute thoracic vertebral fracture identified by CT. Partially visible chronic fracture of the posterior left 12th rib on series 5, image 160. Partially visible chronic sternal fracture on sagittal image 29. Paraspinal and other soft tissues: Calcified aortic atherosclerosis. Calcified coronary artery atherosclerosis. Vascular patency is not evaluated in the absence of IV contrast. No pericardial or pleural effusion. Mild pulmonary atelectasis. Surgically absent gallbladder. Otherwise negative visible noncontrast upper abdominal viscera. Paraspinal soft tissues are within normal limits. Disc levels: Capacious thoracic spinal canal. No age advanced thoracic spine degeneration identified. IMPRESSION: 1. Osteopenia. No acute traumatic injury identified in the thoracic spine. Thoracic MRI without contrast or Nuclear Medicine Whole-body Bone Scan would be most sensitive for detection of occult compression fracture. 2. Partially visible chronic fractures of the left 12th rib and the sternum. 3.  Aortic Atherosclerosis (ICD10-I70.0). Electronically Signed   By: Genevie Ann M.D.   On: 08/23/2019 19:49   Ct Lumbar Spine Wo Contrast  Result Date: 08/23/2019 CLINICAL DATA:  Fall.  Anticoagulated.  Diffuse back pain. EXAM: CT LUMBAR SPINE WITHOUT CONTRAST TECHNIQUE: Multidetector CT imaging of the lumbar spine was performed without intravenous contrast administration. Multiplanar CT image reconstructions were also generated. COMPARISON:  None. FINDINGS: Segmentation: Lumbar type vertebral bodies. Alignment: 2 mm degenerative anterolisthesis L3-4 and L4-5.  Vertebrae: No fracture or primary bone lesion. Paraspinal and other soft tissues: Negative except for aortic atherosclerosis and right renal cysts. Disc levels: No significant finding at L2-3 or above. L3-4: Facet osteoarthritis with 2 mm of anterolisthesis. Bulging of the disc. No compressive stenosis. L4-5: Facet osteoarthritis with 2 mm of anterolisthesis. Bulging of the disc. No compressive stenosis. L5-S1: Disc space narrowing with osteophytes more prominent on the right. Bilateral facet osteoarthritis. No compressive canal stenosis. Foraminal narrowing on the right with some potential to affect the right L5 nerve. IMPRESSION: No acute or traumatic finding. Chronic degenerative changes in the lower lumbar region as outlined above. Electronically Signed   By: Nelson Chimes M.D.   On: 08/23/2019 19:26   Ct Pelvis Wo Contrast  Result Date: 08/23/2019 CLINICAL DATA:  Acute pelvic fractures secondary to a fall. EXAM: CT PELVIS WITHOUT CONTRAST TECHNIQUE: Multidetector CT imaging of the pelvis was performed following the standard protocol without intravenous contrast. COMPARISON:  Radiographs dated 08/23/2019 FINDINGS: Musculoskeletal: There is a comminuted impacted fracture of the left acetabulum involving the anterior and posterior columns. The fracture fragments extend medially into the pelvis. There are 2 fractures of the left inferior pubic ramus. The sacrum and right hemipelvis are intact. Proximal femurs are intact. No dislocation. There is  only minimal hemorrhage in the left side of the pelvis without a defined hematoma. Urinary Tract: The bladder is slightly deviated to the right by the hemorrhage but otherwise appears normal. Bowel:  Extensive sigmoid diverticulosis. Vascular/Lymphatic: No acute abnormality. Aortic atherosclerosis. Reproductive:  Hysterectomy. No adnexal masses. Other:  None. IMPRESSION: 1. Comminuted impacted fracture of the left acetabulum involving the anterior and posterior columns.  2. Two fractures of the left inferior pubic ramus. 3. Minimal hemorrhage in the left side of the pelvis without a defined hematoma. Aortic Atherosclerosis (ICD10-I70.0). Electronically Signed   By: Francene Boyers M.D.   On: 08/23/2019 20:08   Dg Hip Unilat With Pelvis 2-3 Views Left  Result Date: 08/23/2019 CLINICAL DATA:  Fall EXAM: DG HIP (WITH OR WITHOUT PELVIS) 2-3V LEFT COMPARISON:  CT 01/12/2019 FINDINGS: Bones appear osteopenic. SI joints are non widened. The pubic symphysis is intact. Right femoral head projects in joint. Moderate degenerative change. Acute comminuted left acetabular fracture with mild protrusio deformity. Acute mildly comminuted fracture left inferior pubic ramus. Probable acute fracture of the left superior pubic ramus near its junction with the acetabulum. Possible left sacral fracture near the inferior SI joint. IMPRESSION: 1. Acute comminuted left acetabular fracture 2. Acute comminuted left inferior pubic ramus fracture with probable fracture of the left superior pubic ramus. 3. Possible acute left sacral fracture near the inferior SI joint Electronically Signed   By: Jasmine Pang M.D.   On: 08/23/2019 19:12    Microbiology: Recent Results (from the past 240 hour(s))  SARS CORONAVIRUS 2 (TAT 6-24 HRS) Nasopharyngeal Nasopharyngeal Swab     Status: None   Collection Time: 08/23/19  9:32 PM   Specimen: Nasopharyngeal Swab  Result Value Ref Range Status   SARS Coronavirus 2 NEGATIVE NEGATIVE Final    Comment: (NOTE) SARS-CoV-2 target nucleic acids are NOT DETECTED. The SARS-CoV-2 RNA is generally detectable in upper and lower respiratory specimens during the acute phase of infection. Negative results do not preclude SARS-CoV-2 infection, do not rule out co-infections with other pathogens, and should not be used as the sole basis for treatment or other patient management decisions. Negative results must be combined with clinical observations, patient history, and  epidemiological information. The expected result is Negative. Fact Sheet for Patients: HairSlick.no Fact Sheet for Healthcare Providers: quierodirigir.com This test is not yet approved or cleared by the [place] FDA and  has been authorized for detection and/or diagnosis of SARS-CoV-2 by FDA under an Emergency Use Authorization (EUA). This EUA will remain  in effect (meaning this test can be used) for the duration of the COVID-19 declaration under Section 56 4(b)(1) of the Act, 21 U.S.C. section 360bbb-3(b)(1), unless the authorization is terminated or revoked sooner. Performed at [facility], [address] [place], [place] [address]   MRSA PCR Screening     Status: None   Collection Time: 08/23/19 11:58 PM   Specimen: Nasal Mucosa; Nasopharyngeal  Result Value Ref Range Status   MRSA by PCR NEGATIVE NEGATIVE Final    Comment:        The GeneXpert MRSA Assay (FDA approved for NASAL specimens only), is one component of a comprehensive MRSA colonization surveillance program. It is not intended to diagnose MRSA infection nor to guide or monitor treatment for MRSA infections. Performed at [facility], [address] [place], [place] [address]      Labs: Basic Metabolic Panel: Recent Labs  Lab 08/23/19 1744 08/24/19 0319 08/25/19 1027 08/26/19 0522  NA 138 136 137 133*  K 3.8 3.9 3.6 3.7  CL 99 98 104 100  CO2 27 25 24 23   GLUCOSE 149* 157* 115* 141*  BUN 24* 24* 25* 27*  CREATININE 1.25* 1.21* 1.11* 1.08*  CALCIUM 9.1 9.0 8.2* 8.2*   Liver Function Tests: Recent Labs  Lab 08/24/19 0319  AST 27  ALT 17  ALKPHOS 65  BILITOT 0.3  PROT 7.0  ALBUMIN 2.9*   No results for input(s): LIPASE, AMYLASE in the last 168 hours. No results for input(s): AMMONIA in the last 168 hours. CBC: Recent Labs  Lab 08/23/19 1744 08/24/19 0319 08/25/19 0626 08/26/19 0522  WBC 15.8* 19.5* 11.8* 21.4*    NEUTROABS 11.7*  --  8.7* 17.4*  HGB 11.1* 9.6* 7.9* 7.8*  HCT 36.0 31.3* 25.9* 25.3*  MCV 93.3 91.0 91.8 91.3  PLT 415* 412* 326 352   Cardiac Enzymes: No results for input(s): CKTOTAL, CKMB, CKMBINDEX, TROPONINI in the last 168 hours. BNP: BNP (last 3 results) No results for input(s): BNP in the last 8760 hours.  ProBNP (last 3 results) No results for input(s): PROBNP in the last 8760 hours.  CBG: No results for input(s): GLUCAP in the last 168 hours.  Principal Problem:   Fall Active Problems:   Hypertension   Dementia (HCC)   Leukocytosis   Left acetabular fracture (HCC)   Pelvic rami fracture (HCC)   PAF (paroxysmal atrial fibrillation) (HCC)   Time coordinating discharge: 38 minutes.  Signed:        Leylani Duley, DO [facility]  08/27/2019, 9:44 AM

## 2019-08-27 NOTE — TOC Transition Note (Signed)
Transition of Care [facility]) - CM/SW Discharge Note   Patient Details  Name: Kendra Smith MRN: [id number] Date of Birth: 10/24/1926  [facility] of [facility]) CM/SW Contact:  Bary Castilla, LCSW Phone Number: [phone] 08/27/2019, 9:41 AM   Clinical Narrative:    Patient will DC to:?[facility] Anticipated DC date:?08/27/19 Family notified:?Daughter [name]  Transport HB:ZJIR   Per MD patient ready for DC to [facility] RN, patient, patient's family, and facility notified of DC. Discharge Summary sent to facility. RN given number for report [phone] room [id number]. DC packet on chart. Ambulance transport requested for patient.  CSW signing off.   [place], [place] [id number]    Final next level of care: Skilled Nursing Facility Barriers to Discharge: No Barriers Identified   Patient Goals and CMS Choice   CMS Medicare.gov Compare Post Acute Care list provided to:: Patient Represenative (must comment)(Linda Long (daughter/POA)) Choice offered to / list presented to : [facility] POA / Guardian(Linda Long (daughter/POA))  Discharge Placement              Patient chooses bed at: [facility] Patient to be transferred to facility by: [place] Name of family member notified: Daughter [name] Patient and family notified of of transfer: 08/27/19  Discharge Plan and Services In-house Referral: NA Discharge Planning Services: CM Consult Post Acute Care Choice: [facility]          DME Arranged: N/A DME Agency: NA       HH Arranged: NA HH Agency: NA        Social Determinants of Health (SDOH) Interventions     Readmission Risk Interventions No flowsheet data found.

## 2019-08-27 NOTE — Progress Notes (Signed)
ANTICOAGULATION CONSULT NOTE - Follow Up Consult  Pharmacy Consult for Warfarin Indication: pAfib (CHADSVASc = 4)    Allergies  Allergen Reactions  . Ciprofloxacin Hcl Other (See Comments)    On MAR  . Effexor [Venlafaxine] Other (See Comments)    On MAR  . Gantrisin [Sulfisoxazole] Other (See Comments)    On MAR  . Levaquin [Levofloxacin] Other (See Comments)    On MAR  . Sulfa Antibiotics Rash    Patient Measurements: Height: 5\' 3"  (160 cm) Weight: 153 lb (69.4 kg) IBW/kg (Calculated) : 52.4  Vital Signs: Temp: 98.4 F (36.9 C) (10/17 0453) Temp Source: Oral (10/17 0453) BP: 119/57 (10/17 0453) Pulse Rate: 96 (10/17 0453)  Labs: Recent Labs    08/25/19 0626 08/26/19 0522 08/27/19 0243  HGB 7.9* 7.8*  --   HCT 25.9* 25.3*  --   PLT 326 352  --   LABPROT 31.5* 26.3* 24.5*  INR 3.1* 2.5* 2.2*  CREATININE 1.11* 1.08*  --     Estimated Creatinine Clearance: 32.4 mL/min (A) (by C-G formula based on SCr of 1.08 mg/dL (H)).   Assessment: 85 y/o F from memory care facility with fall, CT head negative, on warfarin PTA for afib. PTA regimen: 2 mg daily except 3 mg on Fridays. Planning non-operative management of L acetabular and pubic rami fractures. Pharmacy consulted to dose warfarin.  INR therapeutic at 2.2 after receiving 3 mg on 10/16, trending down from 2.5 yesterday, likely due to dose held on 10/15. Hgb stable at 7.8, Plt wnl 352. No bleeding noted.  Goal of Therapy:  INR 2-3 Monitor platelets by anticoagulation protocol: Yes   Plan:  -Warfarin 3mg  PO x1 today -Daily INR, CBC q72h  Berenice Bouton, PharmD PGY1 Pharmacy Resident Office phone: [phone] 08/27/2019,7:33 AM

## 2019-08-27 NOTE — Progress Notes (Signed)
Pt is A&O x2, calm and cooperative. Pt was discharged to SNF, [facility] transported by [facility]. Report was given to Izell Atwood at [place].

## 2019-08-29 ENCOUNTER — Inpatient Hospital Stay (HOSPITAL_COMMUNITY)
Admission: EM | Admit: 2019-08-29 | Discharge: 2019-09-01 | DRG: 377 | Disposition: A | Discharge status: Skilled Nursing Facility | Payer: Medicare Other | Source: Skilled Nursing Facility | Attending: Internal Medicine | Admitting: Internal Medicine

## 2019-08-29 ENCOUNTER — Emergency Department (HOSPITAL_COMMUNITY): Payer: Medicare Other

## 2019-08-29 ENCOUNTER — Other Ambulatory Visit: Payer: Self-pay

## 2019-08-29 ENCOUNTER — Encounter (HOSPITAL_COMMUNITY): Payer: Self-pay

## 2019-08-29 DIAGNOSIS — N390 Urinary tract infection, site not specified: Secondary | ICD-10-CM | POA: Diagnosis not present

## 2019-08-29 DIAGNOSIS — Z7901 Long term (current) use of anticoagulants: Secondary | ICD-10-CM

## 2019-08-29 DIAGNOSIS — F0390 Unspecified dementia without behavioral disturbance: Secondary | ICD-10-CM | POA: Diagnosis present

## 2019-08-29 DIAGNOSIS — S32402A Unspecified fracture of left acetabulum, initial encounter for closed fracture: Secondary | ICD-10-CM | POA: Diagnosis not present

## 2019-08-29 DIAGNOSIS — Z6827 Body mass index (BMI) 27.0-27.9, adult: Secondary | ICD-10-CM

## 2019-08-29 DIAGNOSIS — M810 Age-related osteoporosis without current pathological fracture: Secondary | ICD-10-CM | POA: Diagnosis present

## 2019-08-29 DIAGNOSIS — R627 Adult failure to thrive: Secondary | ICD-10-CM | POA: Diagnosis present

## 2019-08-29 DIAGNOSIS — E872 Acidosis: Secondary | ICD-10-CM | POA: Diagnosis not present

## 2019-08-29 DIAGNOSIS — K922 Gastrointestinal hemorrhage, unspecified: Secondary | ICD-10-CM | POA: Diagnosis present

## 2019-08-29 DIAGNOSIS — D649 Anemia, unspecified: Secondary | ICD-10-CM

## 2019-08-29 DIAGNOSIS — Z881 Allergy status to other antibiotic agents status: Secondary | ICD-10-CM

## 2019-08-29 DIAGNOSIS — D62 Acute posthemorrhagic anemia: Secondary | ICD-10-CM | POA: Diagnosis not present

## 2019-08-29 DIAGNOSIS — Z66 Do not resuscitate: Secondary | ICD-10-CM | POA: Diagnosis not present

## 2019-08-29 DIAGNOSIS — F329 Major depressive disorder, single episode, unspecified: Secondary | ICD-10-CM | POA: Diagnosis present

## 2019-08-29 DIAGNOSIS — S329XXD Fracture of unspecified parts of lumbosacral spine and pelvis, subsequent encounter for fracture with routine healing: Secondary | ICD-10-CM

## 2019-08-29 DIAGNOSIS — U071 COVID-19: Secondary | ICD-10-CM

## 2019-08-29 DIAGNOSIS — R000 Tachycardia, unspecified: Secondary | ICD-10-CM | POA: Diagnosis not present

## 2019-08-29 DIAGNOSIS — I10 Essential (primary) hypertension: Secondary | ICD-10-CM | POA: Diagnosis present

## 2019-08-29 DIAGNOSIS — W19XXXD Unspecified fall, subsequent encounter: Secondary | ICD-10-CM | POA: Diagnosis present

## 2019-08-29 DIAGNOSIS — Z882 Allergy status to sulfonamides status: Secondary | ICD-10-CM

## 2019-08-29 DIAGNOSIS — H409 Unspecified glaucoma: Secondary | ICD-10-CM | POA: Diagnosis present

## 2019-08-29 DIAGNOSIS — I480 Paroxysmal atrial fibrillation: Secondary | ICD-10-CM | POA: Diagnosis present

## 2019-08-29 DIAGNOSIS — R531 Weakness: Secondary | ICD-10-CM

## 2019-08-29 DIAGNOSIS — I248 Other forms of acute ischemic heart disease: Secondary | ICD-10-CM | POA: Diagnosis present

## 2019-08-29 DIAGNOSIS — R296 Repeated falls: Secondary | ICD-10-CM | POA: Diagnosis present

## 2019-08-29 DIAGNOSIS — D509 Iron deficiency anemia, unspecified: Secondary | ICD-10-CM | POA: Diagnosis present

## 2019-08-29 DIAGNOSIS — K921 Melena: Principal | ICD-10-CM | POA: Diagnosis present

## 2019-08-29 DIAGNOSIS — Z888 Allergy status to other drugs, medicaments and biological substances status: Secondary | ICD-10-CM

## 2019-08-29 DIAGNOSIS — E785 Hyperlipidemia, unspecified: Secondary | ICD-10-CM | POA: Diagnosis present

## 2019-08-29 DIAGNOSIS — E876 Hypokalemia: Secondary | ICD-10-CM | POA: Diagnosis present

## 2019-08-29 DIAGNOSIS — Z515 Encounter for palliative care: Secondary | ICD-10-CM

## 2019-08-29 LAB — CBC WITH DIFFERENTIAL/PLATELET
Abs Immature Granulocytes: 0.3 10*3/uL — ABNORMAL HIGH (ref 0.00–0.07)
Basophils Absolute: 0 10*3/uL (ref 0.0–0.1)
Basophils Relative: 0 %
Eosinophils Absolute: 0.3 10*3/uL (ref 0.0–0.5)
Eosinophils Relative: 2 %
HCT: 19.5 % — ABNORMAL LOW (ref 36.0–46.0)
Hemoglobin: 6 g/dL — CL (ref 12.0–15.0)
Immature Granulocytes: 2 %
Lymphocytes Relative: 11 %
Lymphs Abs: 1.9 10*3/uL (ref 0.7–4.0)
MCH: 28.7 pg (ref 26.0–34.0)
MCHC: 30.8 g/dL (ref 30.0–36.0)
MCV: 93.3 fL (ref 80.0–100.0)
Monocytes Absolute: 1.6 10*3/uL — ABNORMAL HIGH (ref 0.1–1.0)
Monocytes Relative: 10 %
Neutro Abs: 13.1 10*3/uL — ABNORMAL HIGH (ref 1.7–7.7)
Neutrophils Relative %: 75 %
Platelets: 403 10*3/uL — ABNORMAL HIGH (ref 150–400)
RBC: 2.09 MIL/uL — ABNORMAL LOW (ref 3.87–5.11)
RDW: 15.4 % (ref 11.5–15.5)
WBC: 17.2 10*3/uL — ABNORMAL HIGH (ref 4.0–10.5)
nRBC: 0.2 % (ref 0.0–0.2)

## 2019-08-29 LAB — URINALYSIS, ROUTINE W REFLEX MICROSCOPIC
Bilirubin Urine: NEGATIVE
Glucose, UA: NEGATIVE mg/dL
Hgb urine dipstick: NEGATIVE
Ketones, ur: NEGATIVE mg/dL
Nitrite: NEGATIVE
Protein, ur: 30 mg/dL — AB
Specific Gravity, Urine: 1.024 (ref 1.005–1.030)
WBC, UA: >50 WBC/hpf — ABNORMAL HIGH (ref 0–5)
pH: 6 (ref 5.0–8.0)

## 2019-08-29 LAB — COMPREHENSIVE METABOLIC PANEL
ALT: 15 U/L (ref 0–44)
AST: 19 U/L (ref 15–41)
Albumin: 2 g/dL — ABNORMAL LOW (ref 3.5–5.0)
Alkaline Phosphatase: 60 U/L (ref 38–126)
Anion gap: 8 (ref 5–15)
BUN: 22 mg/dL (ref 8–23)
CO2: 24 mmol/L (ref 22–32)
Calcium: 8.5 mg/dL — ABNORMAL LOW (ref 8.9–10.3)
Chloride: 104 mmol/L (ref 98–111)
Creatinine, Ser: 0.99 mg/dL (ref 0.44–1.00)
GFR calc Af Amer: 58 mL/min — ABNORMAL LOW (ref >60)
GFR calc non Af Amer: 50 mL/min — ABNORMAL LOW (ref >60)
Glucose, Bld: 126 mg/dL — ABNORMAL HIGH (ref 70–99)
Potassium: 3.4 mmol/L — ABNORMAL LOW (ref 3.5–5.1)
Sodium: 136 mmol/L (ref 135–145)
Total Bilirubin: 0.5 mg/dL (ref 0.3–1.2)
Total Protein: 5.6 g/dL — ABNORMAL LOW (ref 6.5–8.1)

## 2019-08-29 LAB — LACTIC ACID, PLASMA: Lactic Acid, Venous: 2 mmol/L (ref 0.5–1.9)

## 2019-08-29 LAB — PROTIME-INR
INR: 2.6 — ABNORMAL HIGH (ref 0.8–1.2)
Prothrombin Time: 27.5 seconds — ABNORMAL HIGH (ref 11.4–15.2)

## 2019-08-29 LAB — POC OCCULT BLOOD, ED: Fecal Occult Bld: POSITIVE — AB

## 2019-08-29 LAB — PREPARE RBC (CROSSMATCH)

## 2019-08-29 MED ORDER — PANTOPRAZOLE SODIUM 40 MG IV SOLR
40.0000 mg | Freq: Once | INTRAVENOUS | Status: AC
  Administered 2019-08-29: 40 mg via INTRAVENOUS
  Filled 2019-08-29: qty 40

## 2019-08-29 MED ORDER — FENTANYL CITRATE (PF) 100 MCG/2ML IJ SOLN
50.0000 ug | Freq: Once | INTRAMUSCULAR | Status: AC
  Administered 2019-08-29: 50 ug via INTRAVENOUS
  Filled 2019-08-29: qty 2

## 2019-08-29 MED ORDER — LACTATED RINGERS IV BOLUS
1000.0000 mL | Freq: Once | INTRAVENOUS | Status: AC
  Administered 2019-08-29: 1000 mL via INTRAVENOUS

## 2019-08-29 MED ORDER — FENTANYL CITRATE (PF) 100 MCG/2ML IJ SOLN: 25.0000 ug | Freq: Once | INTRAMUSCULAR | Status: DC

## 2019-08-29 MED ORDER — SODIUM CHLORIDE 0.9 % IV SOLN
10.0000 mL/h | Freq: Once | INTRAVENOUS | Status: AC
  Administered 2019-08-29: 10 mL/h via INTRAVENOUS

## 2019-08-29 MED ORDER — VITAMIN K1 10 MG/ML IJ SOLN
10.0000 mg | Freq: Once | INTRAVENOUS | Status: AC
  Administered 2019-08-29: 10 mg via INTRAVENOUS
  Filled 2019-08-29: qty 1

## 2019-08-29 MED ORDER — IOHEXOL 300 MG/ML  SOLN
80.0000 mL | Freq: Once | INTRAMUSCULAR | Status: AC | PRN
  Administered 2019-08-29: 80 mL via INTRAVENOUS

## 2019-08-29 NOTE — ED Notes (Signed)
[place] pts daughter would like a pt update

## 2019-08-29 NOTE — ED Provider Notes (Signed)
I have personally seen and examined the patient. I have reviewed the documentation on PMH/FH/Soc Hx. I have discussed the plan of care with the resident and patient.  I have reviewed and agree with the resident's documentation. Please see associated encounter note.  Briefly, the patient is a 85 y.o. female here with anemia.  At nursing home patient with a hemoglobin of 6.1.  Recently with pelvic fractures.  Patient arrives with normal vitals.  No fever.  Pleasantly demented.  Continues to have pelvic pain.  Neurologically she appears intact.  Resident states that patient did have black stools on exam.  Hemoccult is positive.  No known history of GI bleeds.  Patient is on Coumadin for atrial fibrillation.  INR is at goal.  Discussed with pharmacy and will treat INR with vitamin K as patient does have a hemoglobin of 6.  Possibly from GI bleed or from hematoma given that she had some small hemorrhage from recent pelvic fracture.  We will get a CT scan of abdomen and pelvis to further evaluate.  IV Protonix is given.  EKG shows sinus rhythm.  Will give 1 unit of packed red blood cells given anemia of 6.  She has been dizzy.  She does appear pale.  Anticipating admission.  However will wait for CT abdomen and pelvis for further evaluation of hematoma. Possible GI source.  Patient with CT scan that shows concern for possible bowel contusion/edema.  However, there is no pneumoperitoneum, no active bleeding or defined hematoma.  There is some trace amount of blood products within the inferior left pelvis likely secondary to recent acetabular fracture that is chronically still there.  Will discuss with surgery given the recent setting of trauma and now bowel contusion with anemia of 6.0 with positive occult, lactic acidosis.  Will admit to medicine for further care.  May need GI consultation if cleared from a surgical standpoint.  This chart was dictated using voice recognition software.  Despite best efforts to  proofread,  errors can occur which can change the documentation meaning.     EKG Interpretation  Date/Time:  Monday August 29 2019 22:06:39 EDT Ventricular Rate:  101 PR Interval:    QRS Duration: 86 QT Interval:  293 QTC Calculation: 380 R Axis:   55 Text Interpretation:  Sinus tachycardia Atrial premature complexes Abnormal R-wave progression, early transition Repol abnrm suggests ischemia, diffuse leads Confirmed by Lennice Sites [phone]) on 08/29/2019 10:26:16 PM      .Critical Care Performed by: Lennice Sites, DO Authorized by: Lennice Sites, DO   Critical care provider statement:    Critical care time (minutes):  40   Critical care was necessary to treat or prevent imminent or life-threatening deterioration of the following conditions:  Circulatory failure   Critical care was time spent personally by me on the following activities:  Blood draw for specimens, development of treatment plan with patient or surrogate, discussions with primary provider, evaluation of patient's response to treatment, examination of patient, obtaining history from patient or surrogate, ordering and performing treatments and interventions, ordering and review of laboratory studies, ordering and review of radiographic studies, pulse oximetry, re-evaluation of patient's condition and review of old charts   I assumed direction of critical care for this patient from another provider in my specialty: no        Lennice Sites, DO 08/29/19 2341

## 2019-08-29 NOTE — ED Provider Notes (Signed)
[facility]   CSN: [id number] Arrival date & time: 08/29/19  2017     History   Chief Complaint Chief Complaint  Patient presents with   Anemia    HPI Kendra [name] is a 85 y.o. female.      Anemia Pertinent negatives include no abdominal pain and no shortness of breath.     Kendra Smith is a 85 y.o. female with a past medical history of hypertension, osteoporosis, depression, atrial fibrillation on coumadin and recent left acetabular fracture with pubic rami fractures on 08/23/2019 presenting today for dizziness that has been acutely worse over the last 1 to 2 days.  Patient presents from her long-term memory care facility where a hemoglobin was checked twice and found to be 6.1.  She denies any other symptoms beyond left hip pain that has been present since the recent hip fracture.  Denies melena, fevers, chills, shortness of breath, cough, previous GI bleeds.  No medications were tried prior to arrival.  Patient states that she would be willing to receive a blood transfusion if necessary.  Nothing seems to make her constant dizziness better or worse.  Past Medical History:  Diagnosis Date   Depression    Glaucoma    Hyperlipidemia    Hypertension    Osteoporosis     Patient Active Problem List   Diagnosis Date Noted   GI bleed 08/30/2019   Left acetabular fracture (HCC) 08/23/2019   Pelvic rami fracture (HCC) 08/23/2019   PAF (paroxysmal atrial fibrillation) (HCC) 08/23/2019   Rib fracture-12 01/12/2019   Fracture of transverse process of lumbar vertebra L1-L2 01/12/2019   Dementia (HCC) 01/12/2019   Fall 01/12/2019   Leukocytosis 01/12/2019   Depression    Hypertension     Past Surgical History:  Procedure Laterality Date   ABDOMINAL HYSTERECTOMY     ANKLE ARTHROSCOPY     CHOLECYSTECTOMY     EYE SURGERY     RECTOCELE REPAIR       OB History   No obstetric history on  file.      Home Medications    Prior to Admission medications   Medication Sig Start Date End Date Taking? Authorizing Provider  acetaminophen (TYLENOL) 500 MG tablet Take 1,000 mg by mouth every 6 (six) hours as needed for mild pain.   Yes [provider]  calcium carbonate (OSCAL) 1500 (600 Ca) MG TABS tablet Take 600 mg of elemental calcium by mouth daily with breakfast.   Yes [provider]  cholecalciferol (VITAMIN D3) 25 MCG (1000 UT) tablet Take 1,000 Units by mouth daily with breakfast.    Yes [provider]  digoxin (LANOXIN) 0.125 MG tablet Take 0.125 mg by mouth every other day.    Yes [provider]  donepezil (ARICEPT) 5 MG tablet Take 5 mg by mouth at bedtime. 10/15/18  Yes [provider]  DULoxetine (CYMBALTA) 60 MG capsule Take 60 mg by mouth daily with breakfast.  05/22/14  Yes [provider]  furosemide (LASIX) 20 MG tablet Take 20 mg by mouth every other day.  11/06/18  Yes [provider]  latanoprost (XALATAN) 0.005 % ophthalmic solution Place 1 drop into both eyes at bedtime.   Yes [provider]  timolol (TIMOPTIC) 0.5 % ophthalmic solution Place 1 drop into both eyes daily. 06/15/14  Yes [provider]  traMADol (ULTRAM) 50 MG tablet Take 50 mg by mouth every 8 (eight) hours as needed for  severe pain.   Yes [provider]  traZODone (DESYREL) 50 MG tablet Take 50 mg by mouth at bedtime. 10/15/18  Yes [provider]  warfarin (COUMADIN) 2 MG tablet Take 2-3 mg by mouth See admin instructions. Take 1 1/2 tablet (3 mg) by mouth on Fridays at 6pm, take 1 tablet (2 mg) on all other evenings at 6 pm 04/13/14  Yes [provider]    Family History History reviewed. No pertinent family history.  Social History Social History   Tobacco Use   Smoking status: Never Smoker   Smokeless tobacco: Never Used  Substance Use Topics   Alcohol use: No   Drug use: No       Allergies   Ciprofloxacin hcl, Effexor [venlafaxine], Gantrisin [sulfisoxazole], Levaquin [levofloxacin], and Sulfa antibiotics   Review of Systems Review of Systems  Constitutional: Negative for chills and fever.  Respiratory: Negative for cough and shortness of breath.   Cardiovascular: Negative for palpitations.  Gastrointestinal: Negative for abdominal pain, blood in stool, nausea and vomiting.  Genitourinary: Negative for dysuria and vaginal bleeding.  Skin: Negative for rash.  Neurological: Positive for dizziness. Negative for syncope.  All other systems reviewed and are negative.    Physical Exam Updated Vital Signs BP (!) 143/61    Pulse 99    Temp 98.4 F (36.9 C) (Oral)    Resp (!) 22    SpO2 99%   Physical Exam Vitals signs and nursing note reviewed.  Constitutional:      General: She is not in acute distress.    Appearance: She is well-developed. She is obese.  HENT:     Head: Normocephalic and atraumatic.  Eyes:     Extraocular Movements: Extraocular movements intact.     Conjunctiva/sclera: Conjunctivae normal.  Neck:     Musculoskeletal: Neck supple.  Cardiovascular:     Rate and Rhythm: Normal rate.     Pulses: Normal pulses.     Heart sounds: No murmur.  Pulmonary:     Effort: Pulmonary effort is normal. No respiratory distress.     Breath sounds: Normal breath sounds.  Abdominal:     General: There is no distension.     Palpations: Abdomen is soft.     Tenderness: There is abdominal tenderness. There is guarding and rebound.     Comments: Extreme abdominal tenderness to palpation, generalized  Genitourinary:    Rectum: Normal. Guaiac result positive.  Skin:    General: Skin is warm and dry.     Findings: No bruising or lesion.  Neurological:     General: No focal deficit present.     Mental Status: She is alert.     Motor: No weakness.     Coordination: Coordination normal.      ED Treatments / Results  Labs (all labs ordered are  listed, but only abnormal results are displayed) Labs Reviewed  CBC WITH DIFFERENTIAL/PLATELET - Abnormal; Notable for the following components:      Result Value   WBC 17.2 (*)    RBC 2.09 (*)    Hemoglobin 6.0 (*)    HCT 19.5 (*)    Platelets 403 (*)    Neutro Abs 13.1 (*)    Monocytes Absolute 1.6 (*)    Abs Immature Granulocytes 0.30 (*)    All other components within normal limits  COMPREHENSIVE METABOLIC PANEL - Abnormal; Notable for the following components:   Potassium 3.4 (*)    Glucose, Bld 126 (*)  Calcium 8.5 (*)    Total Protein 5.6 (*)    Albumin 2.0 (*)    GFR calc non Af Amer 50 (*)    GFR calc Af Amer 58 (*)    All other components within normal limits  LACTIC ACID, PLASMA - Abnormal; Notable for the following components:   Lactic Acid, Venous 2.0 (*)    All other components within normal limits  PROTIME-INR - Abnormal; Notable for the following components:   Prothrombin Time 27.5 (*)    INR 2.6 (*)    All other components within normal limits  URINALYSIS, ROUTINE W REFLEX MICROSCOPIC - Abnormal; Notable for the following components:   APPearance HAZY (*)    Protein, ur 30 (*)    Leukocytes,Ua LARGE (*)    WBC, UA >50 (*)    Bacteria, UA RARE (*)    All other components within normal limits  POC OCCULT BLOOD, ED - Abnormal; Notable for the following components:   Fecal Occult Bld POSITIVE (*)    All other components within normal limits  TYPE AND SCREEN  PREPARE RBC (CROSSMATCH)    EKG EKG Interpretation  Date/Time:  Monday August 29 2019 22:06:39 EDT Ventricular Rate:  101 PR Interval:    QRS Duration: 86 QT Interval:  293 QTC Calculation: 380 R Axis:   55 Text Interpretation:  Sinus tachycardia Atrial premature complexes Abnormal R-wave progression, early transition Repol abnrm suggests ischemia, diffuse leads Confirmed by Lennice Sites [phone] on 08/29/2019 10:26:16 PM   Radiology Ct Abdomen Pelvis W Contrast  Result Date:  08/29/2019 CLINICAL DATA:  Question GI bleed, fall, multiple pelvic fractures EXAM: CT ABDOMEN AND PELVIS WITH CONTRAST TECHNIQUE: Multidetector CT imaging of the abdomen and pelvis was performed using the standard protocol following bolus administration of intravenous contrast. CONTRAST:  81mL OMNIPAQUE IOHEXOL 300 MG/ML  SOLN COMPARISON:  CT pelvis August 23, 2019 FINDINGS: Lower chest: There is mild cardiomegaly. Aortic calcifications are seen. There is a moderate hiatal hernia. Streaky atelectasis or scarring at both lung bases. Hepatobiliary: The liver is normal in density without focal abnormality.The main portal vein is patent. The patient is status post cholecystectomy. No biliary ductal dilation. Pancreas: Unremarkable. No pancreatic ductal dilatation or surrounding inflammatory changes. Spleen: Normal in size without focal abnormality. Adrenals/Urinary Tract: Both adrenal glands appear normal. Bilateral low-density lesions are seen within both kidneys, likely renal cysts. No hydronephrosis or renal calculi. Stomach/Bowel: The stomach and small bowel are normal in appearance. Scattered colonic diverticula are seen. Along the region of the sigmoid colon within the deep pelvis there appears to be mild mesenteric fat stranding changes and question of mild bowel wall edema. No pneumoperitoneum is seen. There is a small amount of stranding in blood products seen within the deep pelvis as on the prior exam. No hyperdense material or active bleeding is seen. No defined hematoma. Vascular/Lymphatic: There are no enlarged mesenteric, retroperitoneal, or pelvic lymph nodes. Scattered aortic atherosclerotic calcifications are seen without aneurysmal dilatation. Reproductive: The patient is status post hysterectomy. No adnexal masses or collections seen. Other: Soft tissue stranding and mild edema seen along the lateral left hip and inguinal region. Musculoskeletal: Again noted is comminuted impacted fractures of the  entirety of the left acetabulum extending into the anterior and posterior columns. The femoral head appears to be medially impacted upon the acetabulum. There also 2 buckle fractures of the inferior pubic rami. There is also nondisplaced fracture extending to the left iliac wing. The sacroiliac joints appear to  be intact. The pubic symphysis is intact. IMPRESSION: 1. Bowel wall thickening with fat stranding changes along the sigmoid colon which could be due to bowel contusion/edema. No pneumoperitoneum. Trace amount of blood products seen within the inferior left deep pelvis. No active bleeding or defined hematoma. 2. Comminuted extensive fractures of the left acetabulum, anterior and posterior walls, left inferior pubic rami and left iliac wing. Electronically Signed   By: Jonna Clark M.D.   On: 08/29/2019 23:09    Procedures Procedures (including critical care time)  Medications Ordered in ED Medications  lactated ringers bolus 1,000 mL (0 mLs Intravenous Stopped 08/29/19 2337)  0.9 %  sodium chloride infusion (10 mL/hr Intravenous New Bag/Given 08/29/19 2259)  phytonadione (VITAMIN K) 10 mg in dextrose 5 % 50 mL IVPB (0 mg Intravenous Stopped 08/30/19 0054)  pantoprazole (PROTONIX) injection 40 mg (40 mg Intravenous Given 08/29/19 2259)  fentaNYL (SUBLIMAZE) injection 50 mcg (50 mcg Intravenous Given 08/29/19 2259)  iohexol (OMNIPAQUE) 300 MG/ML solution 80 mL (80 mLs Intravenous Contrast Given 08/29/19 2252)     Initial Impression / Assessment and Plan / ED Course  I have reviewed the triage vital signs and the nursing notes.  Pertinent labs & imaging results that were available during my care of the patient were reviewed by me and considered in my medical decision making (see chart for details).        Tyshawn Keel is a 85 y.o. female with a past medical history of hypertension, osteoporosis, depression and recent left acetabular fracture with pubic rami fractures on 08/23/2019  presenting today for dizziness that has been acutely worse over the last 1 to 2 days.    History was confirmed with patient's daughter as she has dementia, the patient is pretty oriented during my exam and able to explain multiple things.  Patient is on Coumadin for atrial fibrillation.  On my exam, patient had melena and guaiac positive stool.  Differential diagnosis is GI bleed versus hematoma from recent trauma.  CT scan and labs ordered.  Labs showed a initial hemoglobin of 6.0.  Based on discussion with patient and daughter, blood transfusion is consented for.  At this time, both patient and daughter do not want any further resuscitation if patient's heart were to stop or require a ventilator if she requires intubation.  Daughter states that patient has had these conversations with her prior to advanced dementia multiple times.  They would like all medical interventions at this time. CODE STATUS has been updated in the chart.  Patient's daughter, [name], can be reached at [phone].  CT scan resulted as possible bowel injury.  Lactic acid was significant for 2.0, but there is no other acute significant lab abnormality.  Trauma surgery consulted considering possible bowel injury.  They recommend no acute intervention at this time, so medicine is consulted for admission.  Will not consult GI at this time as patient has remained hemodynamically stable and is not currently bleeding.  Care of patient was discussed with the supervising attending.  Final Clinical Impressions(s) / ED Diagnoses   Final diagnoses:  Symptomatic anemia  Acute blood loss anemia    ED Discharge Orders    None       Chester Holstein, MD 08/30/19 0113    Virgina Norfolk, DO 08/31/19 0127

## 2019-08-29 NOTE — ED Triage Notes (Signed)
Pt bib [facility] after pt's lab indicated hgb of 6.1. Pt BP 88/60 in route per EMS. Pt recently seen for fall and per EMS pt has hip and rib fx.

## 2019-08-30 ENCOUNTER — Observation Stay (HOSPITAL_COMMUNITY): Payer: Medicare Other

## 2019-08-30 DIAGNOSIS — W19XXXD Unspecified fall, subsequent encounter: Secondary | ICD-10-CM | POA: Diagnosis not present

## 2019-08-30 DIAGNOSIS — I4891 Unspecified atrial fibrillation: Secondary | ICD-10-CM | POA: Diagnosis not present

## 2019-08-30 DIAGNOSIS — F0390 Unspecified dementia without behavioral disturbance: Secondary | ICD-10-CM | POA: Diagnosis present

## 2019-08-30 DIAGNOSIS — Z66 Do not resuscitate: Secondary | ICD-10-CM | POA: Diagnosis not present

## 2019-08-30 DIAGNOSIS — N390 Urinary tract infection, site not specified: Secondary | ICD-10-CM

## 2019-08-30 DIAGNOSIS — R531 Weakness: Secondary | ICD-10-CM | POA: Diagnosis not present

## 2019-08-30 DIAGNOSIS — Z7901 Long term (current) use of anticoagulants: Secondary | ICD-10-CM | POA: Diagnosis not present

## 2019-08-30 DIAGNOSIS — F329 Major depressive disorder, single episode, unspecified: Secondary | ICD-10-CM | POA: Diagnosis present

## 2019-08-30 DIAGNOSIS — I480 Paroxysmal atrial fibrillation: Secondary | ICD-10-CM | POA: Diagnosis present

## 2019-08-30 DIAGNOSIS — D649 Anemia, unspecified: Secondary | ICD-10-CM

## 2019-08-30 DIAGNOSIS — N3000 Acute cystitis without hematuria: Secondary | ICD-10-CM

## 2019-08-30 DIAGNOSIS — R1012 Left upper quadrant pain: Secondary | ICD-10-CM | POA: Diagnosis not present

## 2019-08-30 DIAGNOSIS — R296 Repeated falls: Secondary | ICD-10-CM | POA: Diagnosis present

## 2019-08-30 DIAGNOSIS — Z515 Encounter for palliative care: Secondary | ICD-10-CM

## 2019-08-30 DIAGNOSIS — R933 Abnormal findings on diagnostic imaging of other parts of digestive tract: Secondary | ICD-10-CM | POA: Diagnosis not present

## 2019-08-30 DIAGNOSIS — D62 Acute posthemorrhagic anemia: Secondary | ICD-10-CM | POA: Diagnosis present

## 2019-08-30 DIAGNOSIS — Z7401 Bed confinement status: Secondary | ICD-10-CM | POA: Diagnosis not present

## 2019-08-30 DIAGNOSIS — Z881 Allergy status to other antibiotic agents status: Secondary | ICD-10-CM | POA: Diagnosis not present

## 2019-08-30 DIAGNOSIS — R627 Adult failure to thrive: Secondary | ICD-10-CM | POA: Diagnosis not present

## 2019-08-30 DIAGNOSIS — E876 Hypokalemia: Secondary | ICD-10-CM | POA: Diagnosis present

## 2019-08-30 DIAGNOSIS — K922 Gastrointestinal hemorrhage, unspecified: Secondary | ICD-10-CM | POA: Diagnosis not present

## 2019-08-30 DIAGNOSIS — U071 COVID-19: Secondary | ICD-10-CM | POA: Diagnosis not present

## 2019-08-30 DIAGNOSIS — I248 Other forms of acute ischemic heart disease: Secondary | ICD-10-CM

## 2019-08-30 DIAGNOSIS — R410 Disorientation, unspecified: Secondary | ICD-10-CM | POA: Diagnosis not present

## 2019-08-30 DIAGNOSIS — S32501D Unspecified fracture of right pubis, subsequent encounter for fracture with routine healing: Secondary | ICD-10-CM | POA: Diagnosis not present

## 2019-08-30 DIAGNOSIS — F331 Major depressive disorder, recurrent, moderate: Secondary | ICD-10-CM | POA: Diagnosis not present

## 2019-08-30 DIAGNOSIS — I10 Essential (primary) hypertension: Secondary | ICD-10-CM | POA: Diagnosis present

## 2019-08-30 DIAGNOSIS — Z882 Allergy status to sulfonamides status: Secondary | ICD-10-CM | POA: Diagnosis not present

## 2019-08-30 DIAGNOSIS — K921 Melena: Secondary | ICD-10-CM | POA: Diagnosis present

## 2019-08-30 DIAGNOSIS — Z6827 Body mass index (BMI) 27.0-27.9, adult: Secondary | ICD-10-CM | POA: Diagnosis not present

## 2019-08-30 DIAGNOSIS — M810 Age-related osteoporosis without current pathological fracture: Secondary | ICD-10-CM | POA: Diagnosis present

## 2019-08-30 DIAGNOSIS — R402411 Glasgow coma scale score 13-15, in the field [EMT or ambulance]: Secondary | ICD-10-CM | POA: Diagnosis not present

## 2019-08-30 DIAGNOSIS — D509 Iron deficiency anemia, unspecified: Secondary | ICD-10-CM | POA: Diagnosis present

## 2019-08-30 DIAGNOSIS — E872 Acidosis: Secondary | ICD-10-CM | POA: Diagnosis present

## 2019-08-30 DIAGNOSIS — H409 Unspecified glaucoma: Secondary | ICD-10-CM | POA: Diagnosis present

## 2019-08-30 DIAGNOSIS — Z888 Allergy status to other drugs, medicaments and biological substances status: Secondary | ICD-10-CM | POA: Diagnosis not present

## 2019-08-30 DIAGNOSIS — M2550 Pain in unspecified joint: Secondary | ICD-10-CM | POA: Diagnosis not present

## 2019-08-30 DIAGNOSIS — S329XXD Fracture of unspecified parts of lumbosacral spine and pelvis, subsequent encounter for fracture with routine healing: Secondary | ICD-10-CM | POA: Diagnosis not present

## 2019-08-30 DIAGNOSIS — Z7189 Other specified counseling: Secondary | ICD-10-CM

## 2019-08-30 DIAGNOSIS — E785 Hyperlipidemia, unspecified: Secondary | ICD-10-CM | POA: Diagnosis present

## 2019-08-30 DIAGNOSIS — J984 Other disorders of lung: Secondary | ICD-10-CM | POA: Diagnosis not present

## 2019-08-30 LAB — CBC
HCT: 25.1 % — ABNORMAL LOW (ref 36.0–46.0)
HCT: 24.3 % — ABNORMAL LOW (ref 36.0–46.0)
HCT: 23.6 % — ABNORMAL LOW (ref 36.0–46.0)
Hemoglobin: 8.1 g/dL — ABNORMAL LOW (ref 12.0–15.0)
Hemoglobin: 7.9 g/dL — ABNORMAL LOW (ref 12.0–15.0)
Hemoglobin: 7.8 g/dL — ABNORMAL LOW (ref 12.0–15.0)
MCH: 28.9 pg (ref 26.0–34.0)
MCH: 29.4 pg (ref 26.0–34.0)
MCH: 29.7 pg (ref 26.0–34.0)
MCHC: 32.3 g/dL (ref 30.0–36.0)
MCHC: 32.5 g/dL (ref 30.0–36.0)
MCHC: 33.1 g/dL (ref 30.0–36.0)
MCV: 89.6 fL (ref 80.0–100.0)
MCV: 90.3 fL (ref 80.0–100.0)
MCV: 89.7 fL (ref 80.0–100.0)
Platelets: 360 10*3/uL (ref 150–400)
Platelets: 367 10*3/uL (ref 150–400)
Platelets: 382 10*3/uL (ref 150–400)
RBC: 2.8 MIL/uL — ABNORMAL LOW (ref 3.87–5.11)
RBC: 2.69 MIL/uL — ABNORMAL LOW (ref 3.87–5.11)
RBC: 2.63 MIL/uL — ABNORMAL LOW (ref 3.87–5.11)
RDW: 14.6 % (ref 11.5–15.5)
RDW: 14.8 % (ref 11.5–15.5)
RDW: 14.8 % (ref 11.5–15.5)
WBC: 17.4 10*3/uL — ABNORMAL HIGH (ref 4.0–10.5)
WBC: 17.1 10*3/uL — ABNORMAL HIGH (ref 4.0–10.5)
WBC: 16 10*3/uL — ABNORMAL HIGH (ref 4.0–10.5)
nRBC: 0.2 % (ref 0.0–0.2)
nRBC: 0.2 % (ref 0.0–0.2)
nRBC: 0.2 % (ref 0.0–0.2)

## 2019-08-30 LAB — BASIC METABOLIC PANEL
Anion gap: 9 (ref 5–15)
BUN: 16 mg/dL (ref 8–23)
CO2: 25 mmol/L (ref 22–32)
Calcium: 8.1 mg/dL — ABNORMAL LOW (ref 8.9–10.3)
Chloride: 103 mmol/L (ref 98–111)
Creatinine, Ser: 0.85 mg/dL (ref 0.44–1.00)
GFR calc Af Amer: >60 mL/min (ref >60)
GFR calc non Af Amer: >60 mL/min (ref >60)
Glucose, Bld: 118 mg/dL — ABNORMAL HIGH (ref 70–99)
Potassium: 4.1 mmol/L (ref 3.5–5.1)
Sodium: 137 mmol/L (ref 135–145)

## 2019-08-30 LAB — BPAM RBC
Blood Product Expiration Date: 202011172359
ISSUE DATE / TIME: 202010192320
Unit Type and Rh: 7300

## 2019-08-30 LAB — D-DIMER, QUANTITATIVE: D-Dimer, Quant: 3.75 ug/mL-FEU — ABNORMAL HIGH (ref 0.00–0.50)

## 2019-08-30 LAB — TYPE AND SCREEN
ABO/RH(D): B POS
Antibody Screen: NEGATIVE
Unit division: 0

## 2019-08-30 LAB — LACTIC ACID, PLASMA: Lactic Acid, Venous: 1.1 mmol/L (ref 0.5–1.9)

## 2019-08-30 LAB — PROTIME-INR
INR: 1.6 — ABNORMAL HIGH (ref 0.8–1.2)
Prothrombin Time: 19.3 seconds — ABNORMAL HIGH (ref 11.4–15.2)

## 2019-08-30 LAB — CK TOTAL AND CKMB (NOT AT ARMC)
CK, MB: 1.7 ng/mL (ref 0.5–5.0)
Relative Index: INVALID (ref 0.0–2.5)
Total CK: 46 U/L (ref 38–234)

## 2019-08-30 LAB — MAGNESIUM: Magnesium: 1.8 mg/dL (ref 1.7–2.4)

## 2019-08-30 LAB — LACTATE DEHYDROGENASE: LDH: 161 U/L (ref 98–192)

## 2019-08-30 LAB — BRAIN NATRIURETIC PEPTIDE: B Natriuretic Peptide: 333.3 pg/mL — ABNORMAL HIGH (ref 0.0–100.0)

## 2019-08-30 LAB — FERRITIN: Ferritin: 485 ng/mL — ABNORMAL HIGH (ref 11–307)

## 2019-08-30 LAB — TROPONIN I (HIGH SENSITIVITY): Troponin I (High Sensitivity): 12 ng/L (ref <18)

## 2019-08-30 LAB — SARS CORONAVIRUS 2 (TAT 6-24 HRS): SARS Coronavirus 2: POSITIVE — AB

## 2019-08-30 LAB — DIGOXIN LEVEL: Digoxin Level: <0.2 ng/mL — ABNORMAL LOW (ref 0.8–2.0)

## 2019-08-30 LAB — C-REACTIVE PROTEIN: CRP: 20.2 mg/dL — ABNORMAL HIGH (ref <1.0)

## 2019-08-30 MED ORDER — DULOXETINE HCL 60 MG PO CPEP
60.0000 mg | ORAL_CAPSULE | Freq: Every day | ORAL | Status: DC
  Administered 2019-08-31 – 2019-09-01 (×2): 60 mg via ORAL
  Filled 2019-08-30 (×4): qty 1

## 2019-08-30 MED ORDER — DEXAMETHASONE SODIUM PHOSPHATE 10 MG/ML IJ SOLN
6.0000 mg | INTRAMUSCULAR | Status: DC
  Administered 2019-08-30: 6 mg via INTRAVENOUS
  Filled 2019-08-30: qty 1
  Filled 2019-08-30: qty 0.6

## 2019-08-30 MED ORDER — METOPROLOL TARTRATE 25 MG PO TABS
12.5000 mg | ORAL_TABLET | Freq: Two times a day (BID) | ORAL | Status: DC
  Administered 2019-08-30 – 2019-09-01 (×4): 12.5 mg via ORAL
  Filled 2019-08-30 (×4): qty 1

## 2019-08-30 MED ORDER — LATANOPROST 0.005 % OP SOLN
1.0000 [drp] | Freq: Every day | OPHTHALMIC | Status: DC
  Filled 2019-08-30: qty 2.5

## 2019-08-30 MED ORDER — TIMOLOL MALEATE 0.5 % OP SOLN
1.0000 [drp] | Freq: Every day | OPHTHALMIC | Status: DC
  Administered 2019-08-31 – 2019-09-01 (×2): 1 [drp] via OPHTHALMIC
  Filled 2019-08-30 (×2): qty 5

## 2019-08-30 MED ORDER — TRAZODONE HCL 50 MG PO TABS
50.0000 mg | ORAL_TABLET | Freq: Every day | ORAL | Status: DC
  Administered 2019-08-30 – 2019-08-31 (×2): 50 mg via ORAL
  Filled 2019-08-30 (×2): qty 1

## 2019-08-30 MED ORDER — CALCIUM CARBONATE 1500 (600 CA) MG PO TABS
600.0000 mg | ORAL_TABLET | Freq: Every day | ORAL | Status: DC
  Administered 2019-08-31: 1500 mg via ORAL
  Filled 2019-08-30 (×3): qty 1

## 2019-08-30 MED ORDER — SODIUM CHLORIDE 0.9 % IV SOLN
1.0000 g | INTRAVENOUS | Status: DC
  Administered 2019-08-30 – 2019-09-01 (×2): 1 g via INTRAVENOUS
  Filled 2019-08-30 (×2): qty 10
  Filled 2019-08-30: qty 1

## 2019-08-30 MED ORDER — POTASSIUM CHLORIDE 10 MEQ/100ML IV SOLN
10.0000 meq | INTRAVENOUS | Status: AC
  Administered 2019-08-30 (×2): 10 meq via INTRAVENOUS
  Filled 2019-08-30 (×2): qty 100

## 2019-08-30 MED ORDER — PANTOPRAZOLE SODIUM 40 MG IV SOLR
40.0000 mg | Freq: Two times a day (BID) | INTRAVENOUS | Status: DC
  Administered 2019-08-30 – 2019-08-31 (×4): 40 mg via INTRAVENOUS
  Filled 2019-08-30 (×5): qty 40

## 2019-08-30 MED ORDER — VITAMIN D 25 MCG (1000 UNIT) PO TABS
1000.0000 [IU] | ORAL_TABLET | Freq: Every day | ORAL | Status: DC
  Administered 2019-08-31 – 2019-09-01 (×2): 1000 [IU] via ORAL
  Filled 2019-08-30 (×2): qty 1

## 2019-08-30 MED ORDER — SODIUM CHLORIDE 0.9 % IV SOLN
INTRAVENOUS | Status: AC
  Administered 2019-08-30: 02:00:00 via INTRAVENOUS

## 2019-08-30 MED ORDER — DONEPEZIL HCL 10 MG PO TABS
5.0000 mg | ORAL_TABLET | Freq: Every day | ORAL | Status: DC
  Administered 2019-08-30 – 2019-08-31 (×2): 5 mg via ORAL
  Filled 2019-08-30 (×2): qty 1

## 2019-08-30 MED ORDER — TRAMADOL HCL 50 MG PO TABS: 50.0000 mg | ORAL_TABLET | Freq: Three times a day (TID) | ORAL | Status: DC | PRN

## 2019-08-30 MED ORDER — SODIUM CHLORIDE 0.9 % IV SOLN
1.0000 g | Freq: Once | INTRAVENOUS | Status: AC
  Administered 2019-08-30: 1 g via INTRAVENOUS
  Filled 2019-08-30: qty 10

## 2019-08-30 MED ORDER — CLONAZEPAM 0.5 MG PO TABS
0.5000 mg | ORAL_TABLET | Freq: Two times a day (BID) | ORAL | Status: DC | PRN
  Administered 2019-08-30: 0.5 mg via ORAL
  Filled 2019-08-30: qty 1

## 2019-08-30 MED ORDER — DIGOXIN 125 MCG PO TABS
0.1250 mg | ORAL_TABLET | ORAL | Status: DC
  Administered 2019-08-31: 0.125 mg via ORAL
  Filled 2019-08-30 (×2): qty 1

## 2019-08-30 MED ORDER — MORPHINE SULFATE (PF) 2 MG/ML IV SOLN
2.0000 mg | INTRAVENOUS | Status: DC | PRN
  Administered 2019-08-30 – 2019-08-31 (×5): 2 mg via INTRAVENOUS
  Filled 2019-08-30 (×5): qty 1

## 2019-08-30 MED ORDER — ENSURE ENLIVE PO LIQD
237.0000 mL | Freq: Two times a day (BID) | ORAL | Status: DC
  Administered 2019-08-30 – 2019-08-31 (×3): 237 mL via ORAL

## 2019-08-30 MED ORDER — MORPHINE SULFATE (PF) 2 MG/ML IV SOLN
1.0000 mg | INTRAVENOUS | Status: DC | PRN
  Administered 2019-08-30: 1 mg via INTRAVENOUS
  Filled 2019-08-30: qty 1

## 2019-08-30 NOTE — Progress Notes (Addendum)
Serial Abdominal Exam Note  Patient seen and examined. Subjectively, continues to endorse abdominal pain, but when asked to compared to previous exam, she states it is about the same as before. For me, she remains soft without rebound or guarding, and tender on the left side. She is slightly less tender in the LLQ and slightly more tender in the LUQ than my prior exam. She remains HDS and her LA is 1.1 this AM. She had an appropriate response to her pRBC transfusion with a hgb of 8.1 and her INR has corrected after vitamin K from 2.6 to 1.6. Recommend continued monitoring with serial abdominal exams. Await daughter's arrival later this AM.   Jesusita Oka, MD General and [facility]

## 2019-08-30 NOTE — Progress Notes (Signed)
Serial Abdominal Exam Note  Patient seen and examined. Patient reports that her abdominal pain has improved from this morning. She states that she is scared about being diagnosed with COVID and is tearful. She has minimal tenderness to palpation generally without point tenderness. She states this has improved from this morning. She is soft, without rebound, rigidity or guarding. She has remained hemodynamically stable since last seen this AM. Hgb 8.1 after transfusing and has remained stable, 7.9>7.8. No current indications for emergent or urgent surgery. Okay for transfer to [facility] from a trauma standpoint. I have called and discussed this with Dr. Cyndia Skeeters.

## 2019-08-30 NOTE — ED Notes (Signed)
ED TO INPATIENT HANDOFF REPORT  ED Nurse Name and Phone #: Jeannett SeniorStephen [phone]  [phone]  S Name/Age/Gender Watt [name] 85 y.o. female Room/Bed: 024C/024C  Code Status   Code Status: DNR  Home/SNF/Other Home Patient oriented to: self and place Is this baseline? Yes   Triage Complete: Triage complete  Chief Complaint low hgb  Triage Note Pt bib [facility] after pt's lab indicated hgb of 6.1. Pt BP 88/60 in route per EMS. Pt recently seen for fall and per EMS pt has hip and rib fx.   Allergies Allergies  Allergen Reactions  . Ciprofloxacin Hcl Other (See Comments)    On MAR  . Effexor [Venlafaxine] Other (See Comments)    On MAR  . Gantrisin [Sulfisoxazole] Other (See Comments)    On MAR  . Levaquin [Levofloxacin] Other (See Comments)    On MAR  . Sulfa Antibiotics Rash    Level of Care/Admitting Diagnosis ED Disposition    ED Disposition Condition Comment   Admit  [facility] [[id number][facility]  Level of Care: Progressive [102]  I expect the patient will be discharged within 24 hours: No (not a candidate for 5C-Observation unit)  Covid Evaluation: Asymptomatic Screening Protocol (No Symptoms)  Diagnosis: GI bleed [[id number]][[id number]]  Admitting Physician: John GiovanniATHORE, VASUNDHRA [[id number]]  Attending Physician: John GiovanniATHORE, VASUNDHRA [[id number]]  PT Class (Do Not Modify): Observation [104]  PT Acc Code (Do Not Modify): Observation [10022]       B Medical/Surgery History Past Medical History:  Diagnosis Date  . Depression   . Glaucoma   . Hyperlipidemia   . Hypertension   . Osteoporosis    Past Surgical History:  Procedure Laterality Date  . ABDOMINAL HYSTERECTOMY    . ANKLE ARTHROSCOPY    . CHOLECYSTECTOMY    . EYE SURGERY    . RECTOCELE REPAIR       A IV Location/Drains/Wounds Patient Lines/Drains/Airways Status   Active Line/Drains/Airways    Name:   Placement date:   Placement time:   Site:   Days:   Peripheral IV 08/29/19 Left Antecubital   08/29/19     2057    Antecubital   1   Peripheral IV 08/29/19 Right Forearm   08/29/19    2337    Forearm   1   External Urinary Catheter   08/24/19    0208    -   6          Intake/Output Last 24 hours  Intake/Output Summary (Last 24 hours) at 08/30/2019 0251 Last data filed at 08/30/2019 0200 Gross per 24 hour  Intake 315 ml  Output -  Net 315 ml    Labs/Imaging Results for orders placed or performed during the hospital encounter of 08/29/19 (from the past 48 hour(s))  CBC with Differential     Status: Abnormal   Collection Time: 08/29/19  8:40 PM  Result Value Ref Range   WBC 17.2 (H) 4.0 - 10.5 K/uL   RBC 2.09 (L) 3.87 - 5.11 MIL/uL   Hemoglobin 6.0 (LL) 12.0 - 15.0 g/dL    Comment: REPEATED TO VERIFY THIS CRITICAL RESULT HAS VERIFIED AND BEEN CALLED TO S.GRINESTAFF RN BY IMANI MANNING ON 10 19 2020 AT 2058, AND HAS BEEN READ BACK.     HCT 19.5 (L) 36.0 - 46.0 %   MCV 93.3 80.0 - 100.0 fL   MCH 28.7 26.0 - 34.0 pg   MCHC 30.8 30.0 - 36.0 g/dL   RDW 13.015.4 86.511.5 - 78.415.5 %  Platelets 403 (H) 150 - 400 K/uL    Comment: REPEATED TO VERIFY   nRBC 0.2 0.0 - 0.2 %   Neutrophils Relative % 75 %   Neutro Abs 13.1 (H) 1.7 - 7.7 K/uL   Lymphocytes Relative 11 %   Lymphs Abs 1.9 0.7 - 4.0 K/uL   Monocytes Relative 10 %   Monocytes Absolute 1.6 (H) 0.1 - 1.0 K/uL   Eosinophils Relative 2 %   Eosinophils Absolute 0.3 0.0 - 0.5 K/uL   Basophils Relative 0 %   Basophils Absolute 0.0 0.0 - 0.1 K/uL   Immature Granulocytes 2 %   Abs Immature Granulocytes 0.30 (H) 0.00 - 0.07 K/uL    Comment: Performed at [facility], [address] [place], [place] [address]  Comprehensive metabolic panel     Status: Abnormal   Collection Time: 08/29/19  8:40 PM  Result Value Ref Range   Sodium 136 135 - 145 mmol/L   Potassium 3.4 (L) 3.5 - 5.1 mmol/L   Chloride 104 98 - 111 mmol/L   CO2 24 22 - 32 mmol/L   Glucose, Bld 126 (H) 70 - 99 mg/dL   BUN 22 8 - 23 mg/dL   Creatinine, Ser 1.91 0.44 -  1.00 mg/dL   Calcium 8.5 (L) 8.9 - 10.3 mg/dL   Total Protein 5.6 (L) 6.5 - 8.1 g/dL   Albumin 2.0 (L) 3.5 - 5.0 g/dL   AST 19 15 - 41 U/L   ALT 15 0 - 44 U/L   Alkaline Phosphatase 60 38 - 126 U/L   Total Bilirubin 0.5 0.3 - 1.2 mg/dL   GFR calc non Af Amer 50 (L) >60 mL/min   GFR calc Af Amer 58 (L) >60 mL/min   Anion gap 8 5 - 15    Comment: Performed at [facility], [address], [place], [place] [address]  Protime-INR     Status: Abnormal   Collection Time: 08/29/19  8:40 PM  Result Value Ref Range   Prothrombin Time 27.5 (H) 11.4 - 15.2 seconds   INR 2.6 (H) 0.8 - 1.2    Comment: (NOTE) INR goal varies based on device and disease states. Performed at [facility], [address] [place], [place] [address]   Prepare RBC     Status: None   Collection Time: 08/29/19  9:14 PM  Result Value Ref Range   Order Confirmation      ORDER PROCESSED BY BLOOD BANK Performed at [facility], [address], [place], [place] [address]   Type and screen [facility]     Status: None (Preliminary result)   Collection Time: 08/29/19  9:30 PM  Result Value Ref Range   ABO/RH(D) B POS    Antibody Screen NEG    Sample Expiration 09/01/2019,2359    Unit Number [id number]    Blood Component Type RED CELLS,LR    Unit division 00    Status of Unit ISSUED    Transfusion Status OK TO TRANSFUSE    Crossmatch Result      Compatible Performed at [facility], [address], [place], [place] [address]   Lactic acid, plasma     Status: Abnormal   Collection Time: 08/29/19  9:52 PM  Result Value Ref Range   Lactic Acid, Venous 2.0 (HH) 0.5 - 1.9 mmol/L    Comment: CRITICAL RESULT CALLED TO, READ BACK BY AND VERIFIED WITH: RN S Jaire Pinkham @  2222 08/29/19 BY S GEZAHEGN Performed at [facility], [address] [place], [place] [address]   POC occult blood, ED     Status: Abnormal   Collection Time: 08/29/19 10:26 PM  Result Value Ref Range    Fecal Occult Bld POSITIVE (A) NEGATIVE  Urinalysis, Routine w reflex microscopic     Status: Abnormal   Collection Time: 08/29/19 11:24 PM  Result Value Ref Range   Color, Urine YELLOW YELLOW   APPearance HAZY (A) CLEAR   Specific Gravity, Urine 1.024 1.005 - 1.030   pH 6.0 5.0 - 8.0   Glucose, UA NEGATIVE NEGATIVE mg/dL   Hgb urine dipstick NEGATIVE NEGATIVE   Bilirubin Urine NEGATIVE NEGATIVE   Ketones, ur NEGATIVE NEGATIVE mg/dL   Protein, ur 30 (A) NEGATIVE mg/dL   Nitrite NEGATIVE NEGATIVE   Leukocytes,Ua LARGE (A) NEGATIVE   RBC / HPF 0-5 0 - 5 RBC/hpf   WBC, UA >50 (H) 0 - 5 WBC/hpf   Bacteria, UA RARE (A) NONE SEEN   Squamous Epithelial / LPF 6-10 0 - 5   Mucus PRESENT     Comment: Performed at [facility], [address] [place], [place] [address]  Lactic acid, plasma     Status: None   Collection Time: 08/30/19  1:33 AM  Result Value Ref Range   Lactic Acid, Venous 1.1 0.5 - 1.9 mmol/L    Comment: Performed at [facility], [address], [place], [place] [address]   Ct Abdomen Pelvis W Contrast  Result Date: 08/29/2019 CLINICAL DATA:  Question GI bleed, fall, multiple pelvic fractures EXAM: CT ABDOMEN AND PELVIS WITH CONTRAST TECHNIQUE: Multidetector CT imaging of the abdomen and pelvis was performed using the standard protocol following bolus administration of intravenous contrast. CONTRAST:  70mL OMNIPAQUE IOHEXOL 300 MG/ML  SOLN COMPARISON:  CT pelvis August 23, 2019 FINDINGS: Lower chest: There is mild cardiomegaly. Aortic calcifications are seen. There is a moderate hiatal hernia. Streaky atelectasis or scarring at both lung bases. Hepatobiliary: The liver is normal in density without focal abnormality.The main portal vein is patent. The patient is status post cholecystectomy. No biliary ductal dilation. Pancreas: Unremarkable. No pancreatic ductal dilatation or surrounding inflammatory changes. Spleen: Normal in size without focal abnormality.  Adrenals/Urinary Tract: Both adrenal glands appear normal. Bilateral low-density lesions are seen within both kidneys, likely renal cysts. No hydronephrosis or renal calculi. Stomach/Bowel: The stomach and small bowel are normal in appearance. Scattered colonic diverticula are seen. Along the region of the sigmoid colon within the deep pelvis there appears to be mild mesenteric fat stranding changes and question of mild bowel wall edema. No pneumoperitoneum is seen. There is a small amount of stranding in blood products seen within the deep pelvis as on the prior exam. No hyperdense material or active bleeding is seen. No defined hematoma. Vascular/Lymphatic: There are no enlarged mesenteric, retroperitoneal, or pelvic lymph nodes. Scattered aortic atherosclerotic calcifications are seen without aneurysmal dilatation. Reproductive: The patient is status post hysterectomy. No adnexal masses or collections seen. Other: Soft tissue stranding and mild edema seen along the lateral left hip and inguinal region. Musculoskeletal: Again noted is comminuted impacted fractures of the entirety of the left acetabulum extending into the anterior and posterior columns. The femoral head appears to be medially impacted upon the acetabulum. There also 2 buckle fractures of the inferior pubic rami. There is also nondisplaced fracture extending to the left iliac wing. The sacroiliac joints appear to be  intact. The pubic symphysis is intact. IMPRESSION: 1. Bowel wall thickening with fat stranding changes along the sigmoid colon which could be due to bowel contusion/edema. No pneumoperitoneum. Trace amount of blood products seen within the inferior left deep pelvis. No active bleeding or defined hematoma. 2. Comminuted extensive fractures of the left acetabulum, anterior and posterior walls, left inferior pubic rami and left iliac wing. Electronically Signed   By: Prudencio Pair M.D.   On: 08/29/2019 23:09    Pending Labs Unresulted  Labs (From admission, onward)    Start     Ordered   08/30/19 0500  CBC  Now then every 4 hours,   R (with STAT occurrences)     08/30/19 0134   08/30/19 0500  Protime-INR  Tomorrow morning,   R     08/30/19 0134   08/30/19 0150  Magnesium  Add-on,   AD     08/30/19 0149   08/30/19 0134  Urine Culture  Add-on,   AD     08/30/19 0134          Vitals/Pain Today's Vitals   08/30/19 0100 08/30/19 0115 08/30/19 0145 08/30/19 0200  BP: (!) 160/77 (!) 149/65 (!) 146/63 (!) 159/69  Pulse: (!) 107  97   Resp: 20 20 19 18   Temp:    98.2 F (36.8 C)  TempSrc:    Oral  SpO2: 99%  98%   PainSc:        Isolation Precautions No active isolations  Medications Medications  pantoprazole (PROTONIX) injection 40 mg (has no administration in time range)  cefTRIAXone (ROCEPHIN) 1 g in sodium chloride 0.9 % 100 mL IVPB (has no administration in time range)  morphine 2 MG/ML injection 1 mg (1 mg Intravenous Given 08/30/19 0210)  0.9 %  sodium chloride infusion ( Intravenous New Bag/Given 08/30/19 0210)  potassium chloride 10 mEq in 100 mL IVPB (has no administration in time range)  lactated ringers bolus 1,000 mL (0 mLs Intravenous Stopped 08/29/19 2337)  0.9 %  sodium chloride infusion (10 mL/hr Intravenous New Bag/Given 08/29/19 2259)  phytonadione (VITAMIN K) 10 mg in dextrose 5 % 50 mL IVPB (0 mg Intravenous Stopped 08/30/19 0054)  pantoprazole (PROTONIX) injection 40 mg (40 mg Intravenous Given 08/29/19 2259)  fentaNYL (SUBLIMAZE) injection 50 mcg (50 mcg Intravenous Given 08/29/19 2259)  iohexol (OMNIPAQUE) 300 MG/ML solution 80 mL (80 mLs Intravenous Contrast Given 08/29/19 2252)  cefTRIAXone (ROCEPHIN) 1 g in sodium chloride 0.9 % 100 mL IVPB (0 g Intravenous Stopped 08/30/19 0251)    Mobility non-ambulatory High fall risk   Focused Assessments    R Recommendations: See Admitting Provider Note  Report given to:   Additional Notes:

## 2019-08-30 NOTE — Progress Notes (Signed)
PROGRESS NOTE  Watt ClimesFrances Schwabe ZOX:[id number] DOB: 08/30/1926   PCP: Patient, No Pcp Per  Patient is from: [facility] facility  DOA: 08/29/2019 LOS: 0  Brief Narrative / Interim history: 85 year old female with history of dementia, paroxysmal A. fib on Coumadin, HTN, HLD, osteoporosis, depression, glaucoma and recurrent fall presenting from facility with melena and hemoglobin of 6.0.  Recently hospitalized after fall and left pelvic fracture which was managed nonoperatively and discharged on 08/23/2019.  In ED, febrile, slightly tachycardic.  WBC 17.  Hgb 6.0.  INR 2.6.  Lactic acid 2.0.  FOBT positive.  UA concerning for UTI.  CT A/P concerning for bowel contusion along the sigmoid colon and trace amount of blood products within the inferior left deep pelvis.  Received vitamin K in ED.  Trauma surgery consulted and recommended conservative management nonoperatively.   Patient received 1 unit with appropriate response.  H&H stable this morning.  INR down to 1.6.  COVID-19 positive.  Patient was negative on 10/13.  Subjective: No major events this morning.  COVID-19 positive.  Hemodynamically stable.  H&H stable.  Seems to be in pain over left abdomen and pelvis while getting cleaned up this morning.  Fairly oriented.  No respiratory distress.  Extensive discussion with patient's daughter.   Objective: Vitals:   08/30/19 0337 08/30/19 0338 08/30/19 0516 08/30/19 0740  BP: 136/62  (!) 120/51   Pulse: (!) 101  93   Resp: (!) 21  19 19   Temp:      TempSrc:      SpO2: 96%  94% 95%  Weight:  70.2 kg    Height:  5\' 3"  (1.6 m)      Intake/Output Summary (Last 24 hours) at 08/30/2019 1150 Last data filed at 08/30/2019 0740 Gross per 24 hour  Intake 1097.09 ml  Output --  Net 1097.09 ml   Filed Weights   08/30/19 0338  Weight: 70.2 kg    Examination:  GENERAL: Appears to be in distress from pain.  HEENT: MMM.  Vision and hearing grossly intact.  NECK: Supple.  No  apparent JVD.  RESP: On RA.  No IWOB. Good air movement bilaterally. CVS:  RRR. Heart sounds normal.  ABD/GI/GU: Bowel sounds present. Soft.  Tenderness over LLQ MSK/EXT:  Moves extremities. No apparent deformity or edema.  SKIN: no apparent skin lesion or wound NEURO: Awake, alert and oriented to self and place.  No apparent neuro deficit. PSYCH: Seems to be in pain while getting cleaned up.   Assessment & Plan: Acute blood loss anemia/GI bleed/bowel contusion: FOBT positive.  CT A&P concerning for bowel contusion. -Appreciate trauma surgery input-conservative management. -Hgb 7.8 (on 10/13) >6.0 (admit)>1u> 8.1> 7.9 (exaggerated) -INR 2.6 >1.6 this morning.  Received vitamin K in ED. -Monitor H&H -Serial abdominal exam -We will discontinue Coumadin-not a good candidate for anticoagulation given recurrent fall -Continue PPI -Doubt need for GI consult-family hesitant  COVID-19 infection: Likely new infection.  Her COVID-19 is negative on 10/13.  No cardiopulmonary symptoms.  -Check CXR and inflammatory markers -Start dexamethasone -Transfer to 2 W. -Contact and airborne precaution  Leukocytosis: Could be due to UTI, stress and Covid -Check differential  Lactic acidosis: Resolved.  Urinary tract infection: Urinalysis concerning.  Unclear if she has symptoms. -Continue ceftriaxone -Follow urine cultures  Paroxysmal A. fib: Rate controlled.  CHA2DS2-VASc score 6.  She has 11% annual risk of CVA or major thromboembolism.  However, patient with history of recurrent fall and now with GI bleed.  -We  will discontinue Coumadin.  We have discussed risk and benefits with patient's daughter  Hypokalemia:  Advanced dementia: Oriented to self and place this morning. -Frequent reorientation and delirium precautions -Continue home Aricept  Abnormal EKG/demand ischemia: Likely due to anemia.  High-sensitivity troponin negative. -Manage anemia as below  Recent fall with left pelvic  fracture: evaluated by orthopedic surgery last hospitalization recommended nonsurgical care. -Supportive care with pain control  Goal of care discussion: Extensive discussion with patient's daughter about the above findings and plan.  Daughter like to talk to her mother before she makes further goal of care decision.  She understands that she has COVID now.  She would like to see if she can speak to over the phone.  We have also discussed about palliative consult.  She is open to this.  I have also told her a potential transfer to [facility] if hemoglobin remains a stable and other experts signed off on her.   DVT prophylaxis: SCD Code Status: DNR/DNI Family Communication: Extensive discussion with patient's daughter Disposition Plan: Remains inpatient.  Transfer to 2 W Consultants: Trauma surgery  Procedures:  None  Microbiology summarized: [id number] positive  Sch Meds:  Scheduled Meds:  dexamethasone (DECADRON) injection  6 mg Intravenous Q24H   pantoprazole (PROTONIX) IV  40 mg Intravenous Q12H   Continuous Infusions:  sodium chloride 125 mL/hr at 08/30/19 0210   [START ON 08/31/2019] cefTRIAXone (ROCEPHIN)  IV     PRN Meds:.morphine injection  Antimicrobials: Anti-infectives (From admission, onward)   Start     Dose/Rate Route Frequency Ordered Stop   08/31/19 0000  cefTRIAXone (ROCEPHIN) 1 g in sodium chloride 0.9 % 100 mL IVPB     1 g 200 mL/hr over 30 Minutes Intravenous Every 24 hours 08/30/19 0134     08/30/19 0145  cefTRIAXone (ROCEPHIN) 1 g in sodium chloride 0.9 % 100 mL IVPB     1 g 200 mL/hr over 30 Minutes Intravenous  Once 08/30/19 0135 08/30/19 0251       I have personally reviewed the following labs and images: CBC: Recent Labs  Lab 08/23/19 1744  08/25/19 0626 08/26/19 0522 08/29/19 2040 08/30/19 0437 08/30/19 0915  WBC 15.8*   < > 11.8* 21.4* 17.2* 17.4* 17.1*  NEUTROABS 11.7*  --  8.7* 17.4* 13.1*  --   --   HGB 11.1*   < > 7.9* 7.8* 6.0* 8.1*  7.9*  HCT 36.0   < > 25.9* 25.3* 19.5* 25.1* 24.3*  MCV 93.3   < > 91.8 91.3 93.3 89.6 90.3  PLT 415*   < > 326 352 403* 360 367   < > = values in this interval not displayed.   BMP &GFR Recent Labs  Lab 08/24/19 0319 08/25/19 0626 08/26/19 0522 08/29/19 2040 08/30/19 0134 08/30/19 0915  NA 136 137 133* 136  --  137  K 3.9 3.6 3.7 3.4*  --  4.1  CL 98 104 100 104  --  103  CO2 25 24 23 24   --  25  GLUCOSE 157* 115* 141* 126*  --  118*  BUN 24* 25* 27* 22  --  16  CREATININE 1.21* 1.11* 1.08* 0.99  --  0.85  CALCIUM 9.0 8.2* 8.2* 8.5*  --  8.1*  MG  --   --   --   --  1.8  --    Estimated Creatinine Clearance: 41.3 mL/min (by C-G formula based on SCr of 0.85 mg/dL). Liver & Pancreas: Recent Labs  Lab 08/24/19 0319 08/29/19 2040  AST 27 19  ALT 17 15  ALKPHOS 65 60  BILITOT 0.3 0.5  PROT 7.0 5.6*  ALBUMIN 2.9* 2.0*   No results for input(s): LIPASE, AMYLASE in the last 168 hours. No results for input(s): AMMONIA in the last 168 hours. Diabetic: No results for input(s): HGBA1C in the last 72 hours. No results for input(s): GLUCAP in the last 168 hours. Cardiac Enzymes: No results for input(s): CKTOTAL, CKMB, CKMBINDEX, TROPONINI in the last 168 hours. No results for input(s): PROBNP in the last 8760 hours. Coagulation Profile: Recent Labs  Lab 08/25/19 0626 08/26/19 0522 08/27/19 0243 08/29/19 2040 08/30/19 0437  INR 3.1* 2.5* 2.2* 2.6* 1.6*   Thyroid Function Tests: No results for input(s): TSH, T4TOTAL, FREET4, T3FREE, THYROIDAB in the last 72 hours. Lipid Profile: No results for input(s): CHOL, HDL, LDLCALC, TRIG, CHOLHDL, LDLDIRECT in the last 72 hours. Anemia Panel: No results for input(s): VITAMINB12, FOLATE, FERRITIN, TIBC, IRON, RETICCTPCT in the last 72 hours. Urine analysis:    Component Value Date/Time   COLORURINE YELLOW 08/29/2019 2324   APPEARANCEUR HAZY (A) 08/29/2019 2324   LABSPEC 1.024 08/29/2019 2324   PHURINE 6.0 08/29/2019 2324    GLUCOSEU NEGATIVE 08/29/2019 2324   HGBUR NEGATIVE 08/29/2019 2324   BILIRUBINUR NEGATIVE 08/29/2019 2324   KETONESUR NEGATIVE 08/29/2019 2324   PROTEINUR 30 (A) 08/29/2019 2324   UROBILINOGEN 1.0 06/22/2011 1224   NITRITE NEGATIVE 08/29/2019 2324   LEUKOCYTESUR LARGE (A) 08/29/2019 2324   Sepsis Labs: Invalid input(s): PROCALCITONIN, LACTICIDVEN  Microbiology: Recent Results (from the past 240 hour(s))  SARS CORONAVIRUS 2 (TAT 6-24 HRS) Nasopharyngeal Nasopharyngeal Swab     Status: None   Collection Time: 08/23/19  9:32 PM   Specimen: Nasopharyngeal Swab  Result Value Ref Range Status   SARS Coronavirus 2 NEGATIVE NEGATIVE Final    Comment: (NOTE) SARS-CoV-2 target nucleic acids are NOT DETECTED. The SARS-CoV-2 RNA is generally detectable in upper and lower respiratory specimens during the acute phase of infection. Negative results do not preclude SARS-CoV-2 infection, do not rule out co-infections with other pathogens, and should not be used as the sole basis for treatment or other patient management decisions. Negative results must be combined with clinical observations, patient history, and epidemiological information. The expected result is Negative. Fact Sheet for Patients: HairSlick.no Fact Sheet for Healthcare Providers: quierodirigir.com This test is not yet approved or cleared by the [place] FDA and  has been authorized for detection and/or diagnosis of SARS-CoV-2 by FDA under an Emergency Use Authorization (EUA). This EUA will remain  in effect (meaning this test can be used) for the duration of the COVID-19 declaration under Section 56 4(b)(1) of the Act, 21 U.S.C. section 360bbb-3(b)(1), unless the authorization is terminated or revoked sooner. Performed at [facility], [address], [place], [place] [address]   MRSA PCR Screening     Status: None   Collection Time: 08/23/19 11:58 PM    Specimen: Nasal Mucosa; Nasopharyngeal  Result Value Ref Range Status   MRSA by PCR NEGATIVE NEGATIVE Final    Comment:        The GeneXpert MRSA Assay (FDA approved for NASAL specimens only), is one component of a comprehensive MRSA colonization surveillance program. It is not intended to diagnose MRSA infection nor to guide or monitor treatment for MRSA infections. Performed at [facility], [address], [place], [place] [address]   SARS CORONAVIRUS 2 (TAT 6-24 HRS)  Nasopharyngeal Nasopharyngeal Swab     Status: Abnormal   Collection Time: 08/30/19  2:54 AM   Specimen: Nasopharyngeal Swab  Result Value Ref Range Status   SARS Coronavirus 2 POSITIVE (A) NEGATIVE Final    Comment: RESULT CALLED TO, READ BACK BY AND VERIFIED WITH: RN L COX [id number] AT 805 AM BY CM (NOTE) SARS-CoV-2 target nucleic acids are DETECTED. The SARS-CoV-2 RNA is generally detectable in upper and lower respiratory specimens during the acute phase of infection. Positive results are indicative of active infection with SARS-CoV-2. Clinical  correlation with patient history and other diagnostic information is necessary to determine patient infection status. Positive results do  not rule out bacterial infection or co-infection with other viruses. The expected result is Negative. Fact Sheet for Patients: HairSlick.no Fact Sheet for Healthcare Providers: quierodirigir.com This test is not yet approved or cleared by the [place] FDA and  has been authorized for detection and/or diagnosis of SARS-CoV-2 by FDA under an Emergency Use Authorization (EUA). This EUA will remain  in effect (meaning this test can be used) for the d uration of the COVID-19 declaration under Section 564(b)(1) of the Act, 21 U.S.C. section 360bbb-3(b)(1), unless the authorization is terminated or revoked sooner. Performed at [facility], [address],  [place], [place] [address]     Radiology Studies: Ct Abdomen Pelvis W Contrast  Result Date: 08/29/2019 CLINICAL DATA:  Question GI bleed, fall, multiple pelvic fractures EXAM: CT ABDOMEN AND PELVIS WITH CONTRAST TECHNIQUE: Multidetector CT imaging of the abdomen and pelvis was performed using the standard protocol following bolus administration of intravenous contrast. CONTRAST:  80mL OMNIPAQUE IOHEXOL 300 MG/ML  SOLN COMPARISON:  CT pelvis August 23, 2019 FINDINGS: Lower chest: There is mild cardiomegaly. Aortic calcifications are seen. There is a moderate hiatal hernia. Streaky atelectasis or scarring at both lung bases. Hepatobiliary: The liver is normal in density without focal abnormality.The main portal vein is patent. The patient is status post cholecystectomy. No biliary ductal dilation. Pancreas: Unremarkable. No pancreatic ductal dilatation or surrounding inflammatory changes. Spleen: Normal in size without focal abnormality. Adrenals/Urinary Tract: Both adrenal glands appear normal. Bilateral low-density lesions are seen within both kidneys, likely renal cysts. No hydronephrosis or renal calculi. Stomach/Bowel: The stomach and small bowel are normal in appearance. Scattered colonic diverticula are seen. Along the region of the sigmoid colon within the deep pelvis there appears to be mild mesenteric fat stranding changes and question of mild bowel wall edema. No pneumoperitoneum is seen. There is a small amount of stranding in blood products seen within the deep pelvis as on the prior exam. No hyperdense material or active bleeding is seen. No defined hematoma. Vascular/Lymphatic: There are no enlarged mesenteric, retroperitoneal, or pelvic lymph nodes. Scattered aortic atherosclerotic calcifications are seen without aneurysmal dilatation. Reproductive: The patient is status post hysterectomy. No adnexal masses or collections seen. Other: Soft tissue stranding and mild edema seen along the lateral  left hip and inguinal region. Musculoskeletal: Again noted is comminuted impacted fractures of the entirety of the left acetabulum extending into the anterior and posterior columns. The femoral head appears to be medially impacted upon the acetabulum. There also 2 buckle fractures of the inferior pubic rami. There is also nondisplaced fracture extending to the left iliac wing. The sacroiliac joints appear to be intact. The pubic symphysis is intact. IMPRESSION: 1. Bowel wall thickening with fat stranding changes along the sigmoid colon which could be due to bowel contusion/edema. No pneumoperitoneum. Trace amount  of blood products seen within the inferior left deep pelvis. No active bleeding or defined hematoma. 2. Comminuted extensive fractures of the left acetabulum, anterior and posterior walls, left inferior pubic rami and left iliac wing. Electronically Signed   By: Jonna Clark M.D.   On: 08/29/2019 23:09    55 minutes with more than 50% spent in reviewing records, counseling patient/patient's daughter, discussing goal of care and coordinating care.  Danaysia Rader T. Timothy Trudell [facility]  If 7PM-7AM, please contact night-coverage [email or web address] Password [email or web address] 08/30/2019, 11:50 AM

## 2019-08-30 NOTE — Consult Note (Signed)
Reason for Consult:abnormal CT scan and guaiac positive stool Referring Physician: ED MD  Signa Cheek is an 85 y.o. female.   HPI: 51F with h/o dementia, hypertension, paroxysmal atrial fibrillation on chronic anticoagulation, glaucoma, depression and hyperlipidemia s/p fall on 10/13 with resultant left acetabular fracture and left inferior pubic rami fractures, non-operatively managed. She was ultimately discharged to SNF on 10/17, but re-presents today for anemia to 6.1 at SNF and black stools. SBP 80s en route to ED. The patient is unable to provide what I would consider a reliable history, but does endorse left sided abdominal pain.   Past Medical History:  Diagnosis Date   Depression    Glaucoma    Hyperlipidemia    Hypertension    Osteoporosis     Past Surgical History:  Procedure Laterality Date   ABDOMINAL HYSTERECTOMY     ANKLE ARTHROSCOPY     CHOLECYSTECTOMY     EYE SURGERY     RECTOCELE REPAIR      History reviewed. No pertinent family history.  Social History:  reports that she has never smoked. She has never used smokeless tobacco. She reports that she does not drink alcohol or use drugs.  Allergies:  Allergies  Allergen Reactions   Ciprofloxacin Hcl Other (See Comments)    On MAR   Effexor [Venlafaxine] Other (See Comments)    On MAR   Gantrisin [Sulfisoxazole] Other (See Comments)    On MAR   Levaquin [Levofloxacin] Other (See Comments)    On MAR   Sulfa Antibiotics Rash    Medications: I have reviewed the patient's current medications.  Results for orders placed or performed during the hospital encounter of 08/29/19 (from the past 48 hour(s))  CBC with Differential     Status: Abnormal   Collection Time: 08/29/19  8:40 PM  Result Value Ref Range   WBC 17.2 (H) 4.0 - 10.5 K/uL   RBC 2.09 (L) 3.87 - 5.11 MIL/uL   Hemoglobin 6.0 (LL) 12.0 - 15.0 g/dL    Comment: REPEATED TO VERIFY THIS CRITICAL RESULT HAS VERIFIED AND BEEN  CALLED TO S.GRINESTAFF RN BY IMANI MANNING ON 10 19 2020 AT 2058, AND HAS BEEN READ BACK.     HCT 19.5 (L) 36.0 - 46.0 %   MCV 93.3 80.0 - 100.0 fL   MCH 28.7 26.0 - 34.0 pg   MCHC 30.8 30.0 - 36.0 g/dL   RDW 62.1 30.8 - 65.7 %   Platelets 403 (H) 150 - 400 K/uL    Comment: REPEATED TO VERIFY   nRBC 0.2 0.0 - 0.2 %   Neutrophils Relative % 75 %   Neutro Abs 13.1 (H) 1.7 - 7.7 K/uL   Lymphocytes Relative 11 %   Lymphs Abs 1.9 0.7 - 4.0 K/uL   Monocytes Relative 10 %   Monocytes Absolute 1.6 (H) 0.1 - 1.0 K/uL   Eosinophils Relative 2 %   Eosinophils Absolute 0.3 0.0 - 0.5 K/uL   Basophils Relative 0 %   Basophils Absolute 0.0 0.0 - 0.1 K/uL   Immature Granulocytes 2 %   Abs Immature Granulocytes 0.30 (H) 0.00 - 0.07 K/uL    Comment: Performed at [facility], [address], [place], [place] [address]  Comprehensive metabolic panel     Status: Abnormal   Collection Time: 08/29/19  8:40 PM  Result Value Ref Range   Sodium 136 135 - 145 mmol/L   Potassium 3.4 (L) 3.5 - 5.1 mmol/L  Chloride 104 98 - 111 mmol/L   CO2 24 22 - 32 mmol/L   Glucose, Bld 126 (H) 70 - 99 mg/dL   BUN 22 8 - 23 mg/dL   Creatinine, Ser 1.61 0.44 - 1.00 mg/dL   Calcium 8.5 (L) 8.9 - 10.3 mg/dL   Total Protein 5.6 (L) 6.5 - 8.1 g/dL   Albumin 2.0 (L) 3.5 - 5.0 g/dL   AST 19 15 - 41 U/L   ALT 15 0 - 44 U/L   Alkaline Phosphatase 60 38 - 126 U/L   Total Bilirubin 0.5 0.3 - 1.2 mg/dL   GFR calc non Af Amer 50 (L) >60 mL/min   GFR calc Af Amer 58 (L) >60 mL/min   Anion gap 8 5 - 15    Comment: Performed at [facility], [address] [place], [place] [address]  Protime-INR     Status: Abnormal   Collection Time: 08/29/19  8:40 PM  Result Value Ref Range   Prothrombin Time 27.5 (H) 11.4 - 15.2 seconds   INR 2.6 (H) 0.8 - 1.2    Comment: (NOTE) INR goal varies based on device and disease states. Performed at [facility], [address] [place], [place] [address]   Prepare RBC      Status: None   Collection Time: 08/29/19  9:14 PM  Result Value Ref Range   Order Confirmation      ORDER PROCESSED BY BLOOD BANK Performed at [facility], [address] [place], [place] [address]   Type and screen [facility]     Status: None (Preliminary result)   Collection Time: 08/29/19  9:30 PM  Result Value Ref Range   ABO/RH(D) B POS    Antibody Screen NEG    Sample Expiration 09/01/2019,2359    Unit Number [id number]    Blood Component Type RED CELLS,LR    Unit division 00    Status of Unit ISSUED    Transfusion Status OK TO TRANSFUSE    Crossmatch Result      Compatible Performed at [facility], [address] [place], [place] [address]   Lactic acid, plasma     Status: Abnormal   Collection Time: 08/29/19  9:52 PM  Result Value Ref Range   Lactic Acid, Venous 2.0 (HH) 0.5 - 1.9 mmol/L    Comment: CRITICAL RESULT CALLED TO, READ BACK BY AND VERIFIED WITH: RN S TOWNES @2222  08/29/19 BY S GEZAHEGN Performed at [facility], [address], [place], [place]   POC occult blood, ED     Status: Abnormal   Collection Time: 08/29/19 10:26 PM  Result Value Ref Range   Fecal Occult Bld POSITIVE (A) NEGATIVE  Urinalysis, Routine w reflex microscopic     Status: Abnormal   Collection Time: 08/29/19 11:24 PM  Result Value Ref Range   Color, Urine YELLOW YELLOW   APPearance HAZY (A) CLEAR   Specific Gravity, Urine 1.024 1.005 - 1.030   pH 6.0 5.0 - 8.0   Glucose, UA NEGATIVE NEGATIVE mg/dL   Hgb urine dipstick NEGATIVE NEGATIVE   Bilirubin Urine NEGATIVE NEGATIVE   Ketones, ur NEGATIVE NEGATIVE mg/dL   Protein, ur 30 (A) NEGATIVE mg/dL   Nitrite NEGATIVE NEGATIVE   Leukocytes,Ua LARGE (A) NEGATIVE   RBC / HPF 0-5 0 - 5 RBC/hpf   WBC, UA >50 (H) 0 - 5 WBC/hpf   Bacteria, UA RARE (A) NONE SEEN  Squamous Epithelial / LPF 6-10 0 - 5   Mucus PRESENT     Comment: Performed at [facility], [place] [address],  [place], [place] [address]    Ct Abdomen Pelvis W Contrast  Result Date: 08/29/2019 CLINICAL DATA:  Question GI bleed, fall, multiple pelvic fractures EXAM: CT ABDOMEN AND PELVIS WITH CONTRAST TECHNIQUE: Multidetector CT imaging of the abdomen and pelvis was performed using the standard protocol following bolus administration of intravenous contrast. CONTRAST:  38mL OMNIPAQUE IOHEXOL 300 MG/ML  SOLN COMPARISON:  CT pelvis August 23, 2019 FINDINGS: Lower chest: There is mild cardiomegaly. Aortic calcifications are seen. There is a moderate hiatal hernia. Streaky atelectasis or scarring at both lung bases. Hepatobiliary: The liver is normal in density without focal abnormality.The main portal vein is patent. The patient is status post cholecystectomy. No biliary ductal dilation. Pancreas: Unremarkable. No pancreatic ductal dilatation or surrounding inflammatory changes. Spleen: Normal in size without focal abnormality. Adrenals/Urinary Tract: Both adrenal glands appear normal. Bilateral low-density lesions are seen within both kidneys, likely renal cysts. No hydronephrosis or renal calculi. Stomach/Bowel: The stomach and small bowel are normal in appearance. Scattered colonic diverticula are seen. Along the region of the sigmoid colon within the deep pelvis there appears to be mild mesenteric fat stranding changes and question of mild bowel wall edema. No pneumoperitoneum is seen. There is a small amount of stranding in blood products seen within the deep pelvis as on the prior exam. No hyperdense material or active bleeding is seen. No defined hematoma. Vascular/Lymphatic: There are no enlarged mesenteric, retroperitoneal, or pelvic lymph nodes. Scattered aortic atherosclerotic calcifications are seen without aneurysmal dilatation. Reproductive: The patient is status post hysterectomy. No adnexal masses or collections seen. Other: Soft tissue stranding and mild edema seen along the lateral left hip and inguinal  region. Musculoskeletal: Again noted is comminuted impacted fractures of the entirety of the left acetabulum extending into the anterior and posterior columns. The femoral head appears to be medially impacted upon the acetabulum. There also 2 buckle fractures of the inferior pubic rami. There is also nondisplaced fracture extending to the left iliac wing. The sacroiliac joints appear to be intact. The pubic symphysis is intact. IMPRESSION: 1. Bowel wall thickening with fat stranding changes along the sigmoid colon which could be due to bowel contusion/edema. No pneumoperitoneum. Trace amount of blood products seen within the inferior left deep pelvis. No active bleeding or defined hematoma. 2. Comminuted extensive fractures of the left acetabulum, anterior and posterior walls, left inferior pubic rami and left iliac wing. Electronically Signed   By: Prudencio Pair M.D.   On: 08/29/2019 23:09    ROS Blood pressure (!) 138/57, pulse 100, temperature 98.4 F (36.9 C), temperature source Oral, resp. rate 20, SpO2 100 %.    Physical Exam Gen: comfortable, no distress Neuro: non-focal exam HEENT: PERRL Neck: supple CV: RRR Pulm: unlabored breathing Abd: soft, mildly distended, LLQ TTP, no rebound/guarding Extr: wwp, no edema    Assessment/Plan: 61F with anemia, dark stools, and hypotension coupled with CT imaging concerning for bowel wall contusion/edema.  Thirty minute phone conversation with daughter, Lyn Henri, on speakerphone with patient in the room. Explained labs, imaging, and clinical exam findings and that they are not indicative of an urgent need for surgical intervention. Recommend watchful waiting and serial abdominal exams. Part of our discussion involved next steps if the patient should worsen clinically. Patient and daughter seem to be leaning toward not pursuing operative management, even in  this setting, which I think is reasonable. Daughter would like the opportunity to discuss this  again wit the patient in person tomorrow morning. We discussed that the patient is incredibly high risk of intra-operative morbidity and mortality, as well as the high risk of post-operative cardiopulmonary and/or infectious complications. Patient currently receiving 1u pRBC, recommend f/u CBC post-transfusion to ensure appropriate response. Recommend admission for serial labs and abdominal exams. Await further conversation with the daughter, however given the patient's age, current code status, and co-morbidities, would recommend engaging Palliative Care to assist with better delineation of goals of care and to provide additional family support. We will continue to follow.   Lennie OdorAyesha N Atleigh Gruen 08/30/2019, 12:52 AM

## 2019-08-30 NOTE — Consult Note (Signed)
Consultation Note Date: 08/30/2019   Patient Name: Kendra Smith  DOB: 07/06/1926  MRN: [id number]  Age / Sex: 85 y.o., female  PCP: Patient, No Pcp Per Referring Physician: Almon HerculesGonfa, Kendra T, MD  Reason for Consultation: Establishing goals of care and Psychosocial/spiritual support  HPI/Patient Profile: 85 y.o. female   admitted on 08/29/2019 with a past  medical history significant of dementia, paroxysmal atrial fibrillation/chronic anticoagulation, hypertension, hyperlipidemia, osteoporosis, depression, glaucoma, status post fall on 10/13 with resultant pelvic fractures managed nonoperatively, severe iron deficiency anemia presenting to the ED via EMS from her nursing home for evaluation of anemia and black stools.    Noted to have hemoglobin 6.1 on labs done at the nursing home.  Blood pressure 88/60 in route per EMS. Blood pressure stable on ED arrival.  Continued physical, functional and cognitive decline per family over the past several months.   ED Course: Afebrile.  Slightly tachycardic.  Blood pressure stable.  Hemoglobin 6.0, was 7.8 four days ago.  INR 2.6.  Lactic acid 2.0.  FOBT positive.  White blood cell count 17.2, previously elevated as well.  UA with large amount of leukocytes, greater than 50 WBCs, and rare bacteria.  Potassium 3.4.  CT abdomen pelvis with findings concerning for bowel contusion along the sigmoid colon.  No pneumoperitoneum.  Trace amount of blood products seen within the inferior left deep pelvis.  No active bleeding or define hematoma. 1 unit PRBCs ordered.  Patient received IV Protonix 40 mg, 1 L LR bolus, and 10 mg IV vitamin K.  Trauma surgery was consulted and patient was seen by Dr. Bedelia Smith.  After discussion with the family, decision was made to manage patient nonoperatively.    Intermittently confused, without medical decisional capacity  Family face treatment  option decisions, advanced directive decisions and anticipatory care needs.    Clinical Assessment and Goals of Care:   This NP Kendra Smith reviewed medical records, received report from team, and spoke to with her daughter by phone  to discuss diagnosis, prognosis, GOC, EOL wishes disposition and options.  Concept of Hospice and Palliative Care were discussed  A detailed discussion was had today regarding advanced directives.  Concepts specific to code status, artifical feeding and hydration, continued IV antibiotics and rehospitalization was had.  The difference between a aggressive medical intervention path  and a palliative comfort care path for this patient at this time was had.  Values and goals of care important to patient and family were attempted to be elicited.  Created space and opportunity for daughter to explore her thoughts and feeling regarding her mother's current medical situation.    Kendra Smith/daugher verbalizes understanding of her mother's overall medical situation. She recognizes the limited poor prognosis.  We discussed concept specific to human mortality and limitations of medical interventions to prolong life when a body fails to thrive.  Natural trajectory and expectations at EOL were discussed.  Questions and concerns addressed.   Family encouraged to call with questions or concerns.    PMT will continue  to support holistically.      NEXT OF KIN/ daughter Kendra Smith    SUMMARY OF RECOMMENDATIONS    Code Status/Advance Care Planning:  DNR   Palliative Prophylaxis:   Aspiration, Bowel Regimen, Delirium Protocol, Frequent Pain Assessment and Oral Care  Additional Recommendations (Limitations, Scope, Preferences):  Continued current medical interventions until further conversation with PMT tomorrow morning, daughter states "I need to think about all this tonight"  Psycho-social/Spiritual:   Desire for further Chaplaincy support: declined  Additional  Recommendations: Education on Hospice  Prognosis:   Weeks to months  Discharge Planning: To Be Determined      Primary Diagnoses: Present on Admission: . GI bleed . PAF (paroxysmal atrial fibrillation) (HCC)   I have reviewed the medical record, interviewed the patient and family, and examined the patient. The following aspects are pertinent.  Past Medical History:  Diagnosis Date  . Depression   . Glaucoma   . Hyperlipidemia   . Hypertension   . Osteoporosis    Social History   Socioeconomic History  . Marital status: Widowed    Spouse name: Not on file  . Number of children: Not on file  . Years of education: Not on file  . Highest education level: Not on file  Occupational History  . Not on file  Social Needs  . Financial resource strain: Not on file  . Food insecurity    Worry: Not on file    Inability: Not on file  . Transportation needs    Medical: Not on file    Non-medical: Not on file  Tobacco Use  . Smoking status: Never Smoker  . Smokeless tobacco: Never Used  Substance and Sexual Activity  . Alcohol use: No  . Drug use: No  . Sexual activity: Not on file  Lifestyle  . Physical activity    Days per week: Not on file    Minutes per session: Not on file  . Stress: Not on file  Relationships  . Social Musician on phone: Not on file    Gets together: Not on file    Attends religious service: Not on file    Active member of club or organization: Not on file    Attends meetings of clubs or organizations: Not on file    Relationship status: Not on file  Other Topics Concern  . Not on file  Social History Narrative  . Not on file   History reviewed. No pertinent family history. Scheduled Meds: . dexamethasone (DECADRON) injection  6 mg Intravenous Q24H  . pantoprazole (PROTONIX) IV  40 mg Intravenous Q12H   Continuous Infusions: . sodium chloride 125 mL/hr at 08/30/19 0210  . [START ON 08/31/2019] cefTRIAXone (ROCEPHIN)  IV      PRN Meds:.morphine injection Medications Prior to Admission:  Prior to Admission medications   Medication Sig Start Date End Date Taking? Authorizing Provider  acetaminophen (TYLENOL) 500 MG tablet Take 1,000 mg by mouth every 6 (six) hours as needed for mild pain.   Yes [provider]  calcium carbonate (OSCAL) 1500 (600 Ca) MG TABS tablet Take 600 mg of elemental calcium by mouth daily with breakfast.   Yes [provider]  cholecalciferol (VITAMIN D3) 25 MCG (1000 UT) tablet Take 1,000 Units by mouth daily with breakfast.    Yes [provider]  digoxin (LANOXIN) 0.125 MG tablet Take 0.125 mg by mouth every other day.    Yes [provider]  donepezil (ARICEPT)  5 MG tablet Take 5 mg by mouth at bedtime. 10/15/18  Yes [provider]  DULoxetine (CYMBALTA) 60 MG capsule Take 60 mg by mouth daily with breakfast.  05/22/14  Yes [provider]  furosemide (LASIX) 20 MG tablet Take 20 mg by mouth every other day.  11/06/18  Yes [provider]  latanoprost (XALATAN) 0.005 % ophthalmic solution Place 1 drop into both eyes at bedtime.   Yes [provider]  timolol (TIMOPTIC) 0.5 % ophthalmic solution Place 1 drop into both eyes daily. 06/15/14  Yes [provider]  traMADol (ULTRAM) 50 MG tablet Take 50 mg by mouth every 8 (eight) hours as needed for severe pain.   Yes [provider]  traZODone (DESYREL) 50 MG tablet Take 50 mg by mouth at bedtime. 10/15/18  Yes [provider]  warfarin (COUMADIN) 2 MG tablet Take 2-3 mg by mouth See admin instructions. Take 1 1/2 tablet (3 mg) by mouth on Fridays at 6pm, take 1 tablet (2 mg) on all other evenings at 6 pm 04/13/14  Yes [provider]   Allergies  Allergen Reactions  . Ciprofloxacin Hcl Other (See Comments)    On MAR  . Effexor [Venlafaxine] Other (See Comments)    On MAR  . Gantrisin [Sulfisoxazole] Other (See Comments)    On MAR  .  Levaquin [Levofloxacin] Other (See Comments)    On MAR  . Sulfa Antibiotics Rash   Review of Systems  Unable to perform ROS: Acuity of condition    Physical Exam  Vital Signs: BP (!) 120/51   Pulse 93   Temp 98.2 F (36.8 C) (Oral)   Resp 19   Ht 5\' 3"  (1.6 m)   Wt 70.2 kg   SpO2 95%   BMI 27.42 kg/m  Pain Scale: 0-10   Pain Score: 10-Worst pain ever   SpO2: SpO2: 95 % O2 Device:SpO2: 95 % O2 Flow Rate: .   IO: Intake/output summary:   Intake/Output Summary (Last 24 hours) at 08/30/2019 1235 Last data filed at 08/30/2019 0740 Gross per 24 hour  Intake 1097.09 ml  Output -  Net 1097.09 ml    LBM:   Baseline Weight: Weight: 70.2 kg Most recent weight: Weight: 70.2 kg     Palliative Assessment/Data:    The above conversation was completed via telephone due to the visitor restrictions during the COVID-19 pandemic. Thorough chart review and discussion with necessary members of the care team was completed as part of assessment.   Discussed with Dr Cyndia Skeeters  Time In: 1230 Time Out: 1340 Time Total: 70 minutes Greater than 50%  of this time was spent counseling and coordinating care related to the above assessment and plan.  Signed by: Wadie Lessen, NP   Please contact Palliative Medicine Team phone at [phone] for questions and concerns.  For individual provider: See Shea Evans

## 2019-08-30 NOTE — H&P (Signed)
History and Physical    Kendra Smith OVF:[id number] DOB: [birth year] DOA: 08/29/2019  PCP: Patient, No Pcp Per Patient coming from: Nursing home  Chief Complaint: Anemia  HPI: Kendra Smith is a 85 y.o. female with medical history significant of dementia, paroxysmal atrial fibrillation on chronic anticoagulation, hypertension, hyperlipidemia, osteoporosis, depression, glaucoma, status post fall on 10/13 with resultant pelvic fractures managed nonoperatively, severe iron deficiency anemia presenting to the ED via EMS from her nursing home for evaluation of anemia and black stools.  Noted to have hemoglobin 6.1 on labs done at the nursing home.  Blood pressure 88/60 in route per EMS. Blood pressure stable on ED arrival.  Patient is complaining of generalized abdominal pain and blood in her stools.  Denies hematemesis.  States she takes Coumadin.  She has been feeling dizzy.  Denies chest pain or shortness of breath.  Does report having dysuria.  No additional history could be obtained from her.  ED Course: Afebrile.  Slightly tachycardic.  Blood pressure stable.  Hemoglobin 6.0, was 7.8 four days ago.  INR 2.6.  Lactic acid 2.0.  FOBT positive.  White blood cell count 17.2, previously elevated as well.  UA with large amount of leukocytes, greater than 50 WBCs, and rare bacteria.  Potassium 3.4.  CT abdomen pelvis with findings concerning for bowel contusion along the sigmoid colon.  No pneumoperitoneum.  Trace amount of blood products seen within the inferior left deep pelvis.  No active bleeding or define hematoma. 1 unit PRBCs ordered.  Patient received IV Protonix 40 mg, 1 L LR bolus, and 10 mg IV vitamin K. Trauma surgery was consulted and patient was seen by Dr. Bedelia Person.  After discussion with the family, decision was made to manage patient nonoperatively.  ED provider also had a conversation with the patient's family and her CODE STATUS is DNR/DNI.  Review of Systems:  All systems reviewed  and apart from history of presenting illness, are negative.  Past Medical History:  Diagnosis Date   Depression    Glaucoma    Hyperlipidemia    Hypertension    Osteoporosis     Past Surgical History:  Procedure Laterality Date   ABDOMINAL HYSTERECTOMY     ANKLE ARTHROSCOPY     CHOLECYSTECTOMY     EYE SURGERY     RECTOCELE REPAIR       reports that she has never smoked. She has never used smokeless tobacco. She reports that she does not drink alcohol or use drugs.  Allergies  Allergen Reactions   Ciprofloxacin Hcl Other (See Comments)    On MAR   Effexor [Venlafaxine] Other (See Comments)    On MAR   Gantrisin [Sulfisoxazole] Other (See Comments)    On MAR   Levaquin [Levofloxacin] Other (See Comments)    On MAR   Sulfa Antibiotics Rash    History reviewed. No pertinent family history.  Prior to Admission medications   Medication Sig Start Date End Date Taking? Authorizing Provider  acetaminophen (TYLENOL) 500 MG tablet Take 1,000 mg by mouth every 6 (six) hours as needed for mild pain.   Yes [provider]  calcium carbonate (OSCAL) 1500 (600 Ca) MG TABS tablet Take 600 mg of elemental calcium by mouth daily with breakfast.   Yes [provider]  cholecalciferol (VITAMIN D3) 25 MCG (1000 UT) tablet Take 1,000 Units by mouth daily with breakfast.    Yes [provider]  digoxin (LANOXIN) 0.125 MG tablet Take 0.125 mg by mouth every  other day.    Yes [provider]  donepezil (ARICEPT) 5 MG tablet Take 5 mg by mouth at bedtime. 10/15/18  Yes [provider]  DULoxetine (CYMBALTA) 60 MG capsule Take 60 mg by mouth daily with breakfast.  05/22/14  Yes [provider]  furosemide (LASIX) 20 MG tablet Take 20 mg by mouth every other day.  11/06/18  Yes [provider]  latanoprost (XALATAN) 0.005 % ophthalmic solution Place 1 drop into both eyes at bedtime.   Yes [provider]  timolol  (TIMOPTIC) 0.5 % ophthalmic solution Place 1 drop into both eyes daily. 06/15/14  Yes [provider]  traMADol (ULTRAM) 50 MG tablet Take 50 mg by mouth every 8 (eight) hours as needed for severe pain.   Yes [provider]  traZODone (DESYREL) 50 MG tablet Take 50 mg by mouth at bedtime. 10/15/18  Yes [provider]  warfarin (COUMADIN) 2 MG tablet Take 2-3 mg by mouth See admin instructions. Take 1 1/2 tablet (3 mg) by mouth on Fridays at 6pm, take 1 tablet (2 mg) on all other evenings at 6 pm 04/13/14  Yes [provider]    Physical Exam: Vitals:   08/30/19 0030 08/30/19 0048 08/30/19 0100 08/30/19 0115  BP: (!) 125/43 (!) 143/61 (!) 160/77 (!) 149/65  Pulse:  99 (!) 107   Resp: (!) 23 (!) 22 20 20   Temp:      TempSrc:      SpO2:  99% 99%     Physical Exam  Constitutional: She is oriented to person, place, and time. She appears distressed.  Distress secondary to pain  HENT:  Head: Normocephalic.  Eyes: Right eye exhibits no discharge. Left eye exhibits no discharge.  Neck: Neck supple.  Cardiovascular: Regular rhythm and intact distal pulses.  Slightly tachycardic  Pulmonary/Chest: Effort normal and breath sounds normal. No respiratory distress. She has no wheezes. She has no rales.  Abdominal: Soft. Bowel sounds are normal. She exhibits no distension. There is abdominal tenderness. There is guarding.  Generalized tenderness to palpation with guarding  Musculoskeletal:        General: No edema.  Neurological: She is alert and oriented to person, place, and time.  Skin: Skin is warm and dry. She is not diaphoretic.     Labs on Admission: I have personally reviewed following labs and imaging studies  CBC: Recent Labs  Lab 08/23/19 1744 08/24/19 0319 08/25/19 0626 08/26/19 0522 08/29/19 2040  WBC 15.8* 19.5* 11.8* 21.4* 17.2*  NEUTROABS 11.7*  --  8.7* 17.4* 13.1*  HGB 11.1* 9.6* 7.9* 7.8* 6.0*  HCT 36.0 31.3* 25.9* 25.3* 19.5*  MCV  93.3 91.0 91.8 91.3 93.3  PLT 415* 412* 326 352 790*   Basic Metabolic Panel: Recent Labs  Lab 08/23/19 1744 08/24/19 0319 08/25/19 0626 08/26/19 0522 08/29/19 2040  NA 138 136 137 133* 136  K 3.8 3.9 3.6 3.7 3.4*  CL 99 98 104 100 104  CO2 27 25 24 23 24   GLUCOSE 149* 157* 115* 141* 126*  BUN 24* 24* 25* 27* 22  CREATININE 1.25* 1.21* 1.11* 1.08* 0.99  CALCIUM 9.1 9.0 8.2* 8.2* 8.5*   GFR: Estimated Creatinine Clearance: 35.3 mL/min (by C-G formula based on SCr of 0.99 mg/dL). Liver Function Tests: Recent Labs  Lab 08/24/19 0319 08/29/19 2040  AST 27 19  ALT 17 15  ALKPHOS 65 60  BILITOT 0.3 0.5  PROT 7.0 5.6*  ALBUMIN 2.9* 2.0*  No results for input(s): LIPASE, AMYLASE in the last 168 hours. No results for input(s): AMMONIA in the last 168 hours. Coagulation Profile: Recent Labs  Lab 08/24/19 0319 08/25/19 0626 08/26/19 0522 08/27/19 0243 08/29/19 2040  INR 1.8* 3.1* 2.5* 2.2* 2.6*   Cardiac Enzymes: No results for input(s): CKTOTAL, CKMB, CKMBINDEX, TROPONINI in the last 168 hours. BNP (last 3 results) No results for input(s): PROBNP in the last 8760 hours. HbA1C: No results for input(s): HGBA1C in the last 72 hours. CBG: No results for input(s): GLUCAP in the last 168 hours. Lipid Profile: No results for input(s): CHOL, HDL, LDLCALC, TRIG, CHOLHDL, LDLDIRECT in the last 72 hours. Thyroid Function Tests: No results for input(s): TSH, T4TOTAL, FREET4, T3FREE, THYROIDAB in the last 72 hours. Anemia Panel: No results for input(s): VITAMINB12, FOLATE, FERRITIN, TIBC, IRON, RETICCTPCT in the last 72 hours. Urine analysis:    Component Value Date/Time   COLORURINE YELLOW 08/29/2019 2324   APPEARANCEUR HAZY (A) 08/29/2019 2324   LABSPEC 1.024 08/29/2019 2324   PHURINE 6.0 08/29/2019 2324   GLUCOSEU NEGATIVE 08/29/2019 2324   HGBUR NEGATIVE 08/29/2019 2324   BILIRUBINUR NEGATIVE 08/29/2019 2324   KETONESUR NEGATIVE 08/29/2019 2324   PROTEINUR 30 (A)  08/29/2019 2324   UROBILINOGEN 1.0 06/22/2011 1224   NITRITE NEGATIVE 08/29/2019 2324   LEUKOCYTESUR LARGE (A) 08/29/2019 2324    Radiological Exams on Admission: Ct Abdomen Pelvis W Contrast  Result Date: 08/29/2019 CLINICAL DATA:  Question GI bleed, fall, multiple pelvic fractures EXAM: CT ABDOMEN AND PELVIS WITH CONTRAST TECHNIQUE: Multidetector CT imaging of the abdomen and pelvis was performed using the standard protocol following bolus administration of intravenous contrast. CONTRAST:  80mL OMNIPAQUE IOHEXOL 300 MG/ML  SOLN COMPARISON:  CT pelvis August 23, 2019 FINDINGS: Lower chest: There is mild cardiomegaly. Aortic calcifications are seen. There is a moderate hiatal hernia. Streaky atelectasis or scarring at both lung bases. Hepatobiliary: The liver is normal in density without focal abnormality.The main portal vein is patent. The patient is status post cholecystectomy. No biliary ductal dilation. Pancreas: Unremarkable. No pancreatic ductal dilatation or surrounding inflammatory changes. Spleen: Normal in size without focal abnormality. Adrenals/Urinary Tract: Both adrenal glands appear normal. Bilateral low-density lesions are seen within both kidneys, likely renal cysts. No hydronephrosis or renal calculi. Stomach/Bowel: The stomach and small bowel are normal in appearance. Scattered colonic diverticula are seen. Along the region of the sigmoid colon within the deep pelvis there appears to be mild mesenteric fat stranding changes and question of mild bowel wall edema. No pneumoperitoneum is seen. There is a small amount of stranding in blood products seen within the deep pelvis as on the prior exam. No hyperdense material or active bleeding is seen. No defined hematoma. Vascular/Lymphatic: There are no enlarged mesenteric, retroperitoneal, or pelvic lymph nodes. Scattered aortic atherosclerotic calcifications are seen without aneurysmal dilatation. Reproductive: The patient is status post  hysterectomy. No adnexal masses or collections seen. Other: Soft tissue stranding and mild edema seen along the lateral left hip and inguinal region. Musculoskeletal: Again noted is comminuted impacted fractures of the entirety of the left acetabulum extending into the anterior and posterior columns. The femoral head appears to be medially impacted upon the acetabulum. There also 2 buckle fractures of the inferior pubic rami. There is also nondisplaced fracture extending to the left iliac wing. The sacroiliac joints appear to be intact. The pubic symphysis is intact. IMPRESSION: 1. Bowel wall thickening with fat stranding changes along the sigmoid colon which could be  due to bowel contusion/edema. No pneumoperitoneum. Trace amount of blood products seen within the inferior left deep pelvis. No active bleeding or defined hematoma. 2. Comminuted extensive fractures of the left acetabulum, anterior and posterior walls, left inferior pubic rami and left iliac wing. Electronically Signed   By: Jonna Clark M.D.   On: 08/29/2019 23:09    EKG: Independently reviewed.  Sinus tachycardia, PVCs.  ST abnormality in inferior leads similar to prior tracing.  ST abnormality in lateral leads appears new compared to prior tracing.  Assessment/Plan Principal Problem:   GI bleed Active Problems:   PAF (paroxysmal atrial fibrillation) (HCC)   UTI (urinary tract infection)   Hypokalemia   Demand ischemia (HCC)   Acute blood loss anemia/ GI bleed/ bowel contusion in the setting of recent fall on 10/13 Presenting from her nursing home for evaluation of anemia and melena.  Slightly tachycardic.  Hypotensive per EMS but blood pressure has remained stable since arrival to the ED.  Hemoglobin 6.0, was 7.8 prior to recent hospital discharge.  FOBT positive.  Lactic acid mildly elevated at 2.0.  Takes Coumadin for A. fib and INR 2.6. CT abdomen pelvis with findings concerning for bowel contusion along the sigmoid colon.  No  pneumoperitoneum.  Trace amount of blood products seen within the inferior left deep pelvis.  No active bleeding or define hematoma.  Patient was seen by trauma surgery and after discussion with the family, decision was made to manage her condition nonoperatively given extremely high risk of intraoperative morbidity and mortality. -IV fluid hydration -Keep n.p.o. at this time -1 unit PRBCs, posttransfusion H&H.  Order additional PRBCs if hemoglobin less than 7. -IV morphine as needed pain -IV Protonix 40 mg every 12 hours -Palliative care consult -Serial CBCs -Serial abdominal exams -Trend lactate -Hold Coumadin -Received IV vitamin K 10 mg in the ED  UTI White blood cell count 17.2, previously elevated as well.  Patient is afebrile.  UA with large amount of leukocytes, greater than 50 WBCs, and rare bacteria.   -Ceftriaxone -Urine culture  Hypokalemia Potassium 3.4.   -Replete potassium.  Check magnesium level and replete if low.  Continue to monitor electrolytes.  EKG abnormality/ demand ischemia EKG with new ST abnormality in lateral leads.  Suspect related to demand ischemia from acute blood loss anemia.  Patient has no complaints of chest pain and appears comfortable on exam. -Cardiac monitoring -High-sensitivity troponin  Paroxysmal atrial fibrillation INR therapeutic at 2.6. -Received IV vitamin K.  Hold Coumadin given active GI bleed. -Continue to monitor INR  DVT prophylaxis: SCDs Code Status: DNR/ DNI Family Communication: No family available at this time. Disposition Plan: Anticipate discharge after clinical improvement. Consults called: Trauma surgery Admission status: It is my clinical opinion that referral for OBSERVATION is reasonable and necessary in this patient based on the above information provided. The aforementioned taken together are felt to place the patient at high risk for further clinical deterioration. However it is anticipated that the patient may be  medically stable for discharge from the hospital within 24 to 48 hours.  The medical decision making on this patient was of high complexity and the patient is at high risk for clinical deterioration, therefore this is a level 3 visit.  John Giovanni MD [facility] Pager [phone]  If 7PM-7AM, please contact night-coverage [email or web address] Password [email or web address]  08/30/2019, 1:58 AM

## 2019-08-31 ENCOUNTER — Other Ambulatory Visit: Payer: Self-pay

## 2019-08-31 DIAGNOSIS — R627 Adult failure to thrive: Secondary | ICD-10-CM

## 2019-08-31 DIAGNOSIS — R531 Weakness: Secondary | ICD-10-CM

## 2019-08-31 DIAGNOSIS — U071 COVID-19: Secondary | ICD-10-CM

## 2019-08-31 DIAGNOSIS — K922 Gastrointestinal hemorrhage, unspecified: Secondary | ICD-10-CM | POA: Diagnosis not present

## 2019-08-31 DIAGNOSIS — I248 Other forms of acute ischemic heart disease: Secondary | ICD-10-CM

## 2019-08-31 LAB — PROTIME-INR
INR: 1.3 — ABNORMAL HIGH (ref 0.8–1.2)
Prothrombin Time: 15.7 seconds — ABNORMAL HIGH (ref 11.4–15.2)

## 2019-08-31 LAB — COMPREHENSIVE METABOLIC PANEL
ALT: 16 U/L (ref 0–44)
AST: 15 U/L (ref 15–41)
Albumin: 2.5 g/dL — ABNORMAL LOW (ref 3.5–5.0)
Alkaline Phosphatase: 66 U/L (ref 38–126)
Anion gap: 12 (ref 5–15)
BUN: 19 mg/dL (ref 8–23)
CO2: 25 mmol/L (ref 22–32)
Calcium: 8.5 mg/dL — ABNORMAL LOW (ref 8.9–10.3)
Chloride: 101 mmol/L (ref 98–111)
Creatinine, Ser: 0.85 mg/dL (ref 0.44–1.00)
GFR calc Af Amer: >60 mL/min (ref >60)
GFR calc non Af Amer: >60 mL/min (ref >60)
Glucose, Bld: 142 mg/dL — ABNORMAL HIGH (ref 70–99)
Potassium: 3.8 mmol/L (ref 3.5–5.1)
Sodium: 138 mmol/L (ref 135–145)
Total Bilirubin: 0.4 mg/dL (ref 0.3–1.2)
Total Protein: 6.5 g/dL (ref 6.5–8.1)

## 2019-08-31 LAB — C-REACTIVE PROTEIN: CRP: 19.1 mg/dL — ABNORMAL HIGH (ref <1.0)

## 2019-08-31 LAB — LACTATE DEHYDROGENASE: LDH: 171 U/L (ref 98–192)

## 2019-08-31 LAB — HEMOGLOBIN AND HEMATOCRIT, BLOOD
HCT: 25.3 % — ABNORMAL LOW (ref 36.0–46.0)
Hemoglobin: 7.9 g/dL — ABNORMAL LOW (ref 12.0–15.0)

## 2019-08-31 LAB — FERRITIN: Ferritin: 510 ng/mL — ABNORMAL HIGH (ref 11–307)

## 2019-08-31 MED ORDER — CALCIUM CARBONATE 1250 (500 CA) MG PO TABS
1.0000 | ORAL_TABLET | Freq: Every day | ORAL | Status: DC
  Administered 2019-09-01: 500 mg via ORAL
  Filled 2019-08-31 (×4): qty 1

## 2019-08-31 MED ORDER — SODIUM CHLORIDE 0.9 % IV SOLN
510.0000 mg | Freq: Once | INTRAVENOUS | Status: AC
  Administered 2019-08-31: 510 mg via INTRAVENOUS
  Filled 2019-08-31: qty 17

## 2019-08-31 NOTE — Progress Notes (Signed)
[name], daughter, called and updated on patient condition.  Answered questions for daughter regarding patient condition.  Daughter appreciative of call and updates.  Assured daughter that will keep her up to date.

## 2019-08-31 NOTE — Progress Notes (Signed)
Patient ID: Kendra Smith, female   DOB: [birth year], 85 y.o.   MRN: [id number]  This NP visited patient at the bedside as a follow up to  yesterday's [place], for palliative medicine needs and emotional support.  Patient moved to [place] yesterday.  Spoke with daughter/Linda by telephone for continued conversation regarding current medical situation, treatment options, goals of care and anticipatory care needs.  [name] tells me that her mother is improving and that the plan is to transition her back to [facility] for ongoing physical therapy, she is showing no signs of Covid applications.  Linda verbalizes that as long as her mom is interacting in her environment that she is open to interventions to medically maximize her health and wellness.  However if she would have a continued decline she knows that it would be the time to focus on comfort and allowing natural death.  Emotional support offered  Discussed with family  the importance of continued conversation with family memebers and the medical providers regarding overall plan of care and treatment options,  ensuring decisions are within the context of the patients values and GOCs.  Questions and concerns addressed   recommend palliative services to follow at skilled nursing facility  Total time spent on the unit was 20 minutes  Greater than 50% of the time was spent in counseling and coordination of care  Wadie Lessen NP  Palliative Medicine Team Team Phone # [phone] Pager [phone]

## 2019-08-31 NOTE — NC FL2 (Signed)
[place] MEDICAID FL2 LEVEL OF CARE SCREENING TOOL     IDENTIFICATION  Patient Name: Kendra Smith Birthdate: [birth year] Sex: female Admission Date (Current Location): 08/29/2019  [facility] and [place] Number:  Producer, television/film/video and Address:  [facility], [address] [place], [place] [address]      Provider Number: [id number]  Attending Physician Name and Address:  Coralie Keens  Relative Name and Phone Number:       Current Level of Care: Hospital Recommended Level of Care: Skilled Nursing Facility Prior Approval Number:    Date Approved/Denied:   PASRR Number: [id number]  Discharge Plan: SNF    Current Diagnoses: Patient Active Problem List   Diagnosis Date Noted  . GI bleed 08/30/2019  . UTI (urinary tract infection) 08/30/2019  . Hypokalemia 08/30/2019  . Demand ischemia (HCC) 08/30/2019  . Palliative care by specialist   . COVID-19   . DNR (do not resuscitate)   . Adult failure to thrive   . Left acetabular fracture (HCC) 08/23/2019  . Pelvic rami fracture (HCC) 08/23/2019  . PAF (paroxysmal atrial fibrillation) (HCC) 08/23/2019  . Rib fracture-12 01/12/2019  . Fracture of transverse process of lumbar vertebra L1-L2 01/12/2019  . Dementia (HCC) 01/12/2019  . Fall 01/12/2019  . Leukocytosis 01/12/2019  . Depression   . Hypertension     Orientation RESPIRATION BLADDER Height & Weight     Self  Normal External catheter Weight: 154 lb 12.2 oz (70.2 kg) Height:  5\' 3"  (160 cm)  BEHAVIORAL SYMPTOMS/MOOD NEUROLOGICAL BOWEL NUTRITION STATUS        Diet(See dc summary)  AMBULATORY STATUS COMMUNICATION OF NEEDS Skin   Extensive Assist Verbally Normal                       Personal Care Assistance Level of Assistance  Bathing, Feeding, Dressing Bathing Assistance: Maximum assistance Feeding assistance: Limited assistance Dressing Assistance: Maximum assistance     Functional Limitations Info    Sight  Info: Adequate Hearing Info: Adequate Speech Info: Adequate    SPECIAL CARE FACTORS FREQUENCY  PT (By licensed PT), OT (By licensed OT)     PT Frequency: 5 OT Frequency: 5            Contractures      Additional Factors Info  Code Status, Allergies Code Status Info: DNR Allergies Info: Ciprofloxacin Hcl Effexor (Venlafaxine) Gantrisin (Sulfisoxazole) Levaquin (Levofloxacin) Sulfa Antibiotics           Current Medications (08/31/2019):  This is the current hospital active medication list Current Facility-Administered Medications  Medication Dose Route Frequency Provider Last Rate Last Dose  . calcium carbonate (OS-CAL - dosed in mg of elemental calcium) tablet 500 mg of elemental calcium  1 tablet Oral Q breakfast Arrien, 09/02/2019, MD      . cefTRIAXone (ROCEPHIN) 1 g in sodium chloride 0.9 % 100 mL IVPB  1 g Intravenous Q24H York Ram, MD   Stopped at 08/31/19 0005  . cholecalciferol (VITAMIN D3) tablet 1,000 Units  1,000 Units Oral Q breakfast 09/02/19, MD   1,000 Units at 08/31/19 0626  . clonazePAM (KLONOPIN) tablet 0.5 mg  0.5 mg Oral BID PRN 09/02/19 T, MD   0.5 mg at 08/30/19 2049  . digoxin (LANOXIN) tablet 0.125 mg  0.125 mg Oral QODAY Gonfa, Taye T, MD   0.125 mg at 08/31/19 1100  . donepezil (ARICEPT) tablet 5 mg  5 mg Oral QHS Wendee Beavers T, MD   5 mg at 08/30/19 2049  . DULoxetine (CYMBALTA) DR capsule 60 mg  60 mg Oral Q breakfast Wendee Beavers T, MD   60 mg at 08/31/19 0647  . feeding supplement (ENSURE ENLIVE) (ENSURE ENLIVE) liquid 237 mL  237 mL Oral BID BM Gonfa, Taye T, MD   237 mL at 08/31/19 1106  . ferumoxytol (FERAHEME) 510 mg in sodium chloride 0.9 % 100 mL IVPB  510 mg Intravenous Once Arrien, Jimmy Picket, MD      . latanoprost (XALATAN) 0.005 % ophthalmic solution 1 drop  1 drop Both Eyes QHS Gonfa, Taye T, MD      . metoprolol tartrate (LOPRESSOR) tablet 12.5 mg  12.5 mg Oral BID Wendee Beavers T, MD   12.5 mg at 08/31/19 1100  .  morphine 2 MG/ML injection 2 mg  2 mg Intravenous Q3H PRN Wendee Beavers T, MD   2 mg at 08/31/19 0005  . pantoprazole (PROTONIX) injection 40 mg  40 mg Intravenous Q12H Gonfa, Taye T, MD   40 mg at 08/31/19 1100  . timolol (TIMOPTIC) 0.5 % ophthalmic solution 1 drop  1 drop Both Eyes Daily Wendee Beavers T, MD   1 drop at 08/31/19 1234  . traMADol (ULTRAM) tablet 50 mg  50 mg Oral Q8H PRN Wendee Beavers T, MD      . traZODone (DESYREL) tablet 50 mg  50 mg Oral QHS Mercy Riding, MD   50 mg at 08/30/19 2049     Discharge Medications: Please see discharge summary for a list of discharge medications.  Relevant Imaging Results:  Relevant Lab Results:   Additional Information SN: [id number]  Weston Anna, LCSW

## 2019-08-31 NOTE — Progress Notes (Addendum)
PROGRESS NOTE    Kendra Smith  OVF:[id number] DOB: 1926-04-21 DOA: 08/29/2019 PCP: Patient, No Pcp Per    Brief Narrative:  85 year old female who presented with anemia, she does have significant past medical history for dementia, paroxysmal atrial fibrillation, hypertension, dyslipidemia, osteoporosis, and depression.  She had a fall on October 13 resulting in pelvic fractures, managed nonoperatively.  She lives at a nursing facility.  She complained of generalized abdominal pain, she was found to have black stools and dizziness, her hemoglobin was 6.1, patient was transported to the hospital for further evaluation.  Her blood pressure was 125/43, heart rate 107, respiratory 22, oxygen saturation 99%, she was in pain, heart S1-S2 present, tachycardic, her lungs were clear to auscultation bilaterally, abdomen was tender, positive guarding no rebound, no lower extremity edema. Sodium 137, potassium 4.1, chloride 103, bicarb 25, glucose 118, BUN 16, creatinine 0.85, white count 17.2, hemoglobin 6.0, hematocrit 19.5, platelets 403.  INR 1.6.  SARS COVID-19 was positive.  Urinalysis more than 50 white cells, 0-5 red cells.  Chest radiograph negative for infiltrates.  CT abdomen with findings concerning for bowel contusion along the sigmoid colon. EKg with 101 bpm, normal axis, normal intervals, sinus rhythm with pac and pvc, no st segment changes and negative T V3 to V6.   Patient was admitted to the hospital working diagnosis of symptomatic anemia possible lower GI bleed  Assessment & Plan:   Principal Problem:   GI bleed Active Problems:   PAF (paroxysmal atrial fibrillation) (HCC)   UTI (urinary tract infection)   Hypokalemia   Demand ischemia (HCC)   Palliative care by specialist   COVID-19   DNR (do not resuscitate)   Adult failure to thrive   1. Acute anemia, possible lower GI bleed with acute blood loss anemia in the setting of chronic anemia of iron deficiency. Patient is sp 1  units PRBC transfusion, Her Hgb is stable at 7,8 and 7,9. With no signs of active bleeding. Iron panel with serum iron 12, TIBC 336, transferrin saturation 4%. CT abdomen and pelvis with no hematoma. Continue with proton pump inh for now.  2. Paroxysmal atrial fibrillation. Rate has remained controlled, will hold on anticoagulation due to fall and bleeding risk. Patient received Vitamin K on this hospitalization. Continue with digoxin and metoprolol for rate control.   3. Hypokalemia. Continue K correction with Kcl. Renal function stable with serum cr at 0.85 with serum bicarbonate at 25.   4. Left pelvic fracture. Continue pain control and physical therapy evaluation, patient is very weak and deconditioned. She lived in a memory unit.   5. Advanced dementia. Patient is not interactive, suspected to be at baseline. Continue with clonazepam, donepezil, duloxetin.    6. Urine infection/ present on admission/ gram negative rods. Continue with antibiotic therapy, ceftriaxone.  7. COVID 19 infection. No clinical or radiographic signs of viral pneumonia, will hold on steroids and antiviral agents, will continue oxymetry monitoring for now.     DVT prophylaxis: scd   Code Status:  DNR  Family Communication: I spoke with patient's daughter over the phone and all questions were addressed.  Disposition Plan/ discharge barriers:  Possible dc to snf in am.   Body mass index is 27.42 kg/m. Malnutrition Type:      Malnutrition Characteristics:      Nutrition Interventions:     RN Pressure Injury Documentation:     Consultants:     Procedures:     Antimicrobials:  Subjective: Patient is awake but not interactive, with no apparent dyspnea or pain, no nausea or vomiting.   Objective: Vitals:   08/30/19 2137 08/31/19 0030 08/31/19 0505 08/31/19 0700  BP:  (!) 156/65 114/77 (!) 125/58  Pulse: 88 95 100 89  Resp: (!) 21 (!) 21 17 18   Temp:  98 F (36.7 C) 98.3 F  (36.8 C) (!) 97.5 F (36.4 C)  TempSrc:  Oral Axillary Oral  SpO2: 90% 96% 95% 96%  Weight:      Height:        Intake/Output Summary (Last 24 hours) at 08/31/2019 0841 Last data filed at 08/30/2019 2304 Gross per 24 hour  Intake 100 ml  Output -  Net 100 ml   Filed Weights   08/30/19 0338  Weight: 70.2 kg    Examination:   General: Not in pain or dyspnea.  Neurology: Awake and alert, non focal, not following commands.   E ENT: no pallor, no icterus, oral mucosa moist Cardiovascular: No JVD. S1-S2 present, rhythmic, no gallops, rubs, or murmurs. No lower extremity edema. Pulmonary: positive breath sounds bilaterally. Gastrointestinal. Abdomen with no organomegaly, non tender, no rebound or guarding Skin. No rashes Musculoskeletal: no joint deformities     Data Reviewed: I have personally reviewed following labs and imaging studies  CBC: Recent Labs  Lab 08/25/19 0626 08/26/19 0522 08/29/19 2040 08/30/19 0437 08/30/19 0915 08/30/19 1155  WBC 11.8* 21.4* 17.2* 17.4* 17.1* 16.0*  NEUTROABS 8.7* 17.4* 13.1*  --   --   --   HGB 7.9* 7.8* 6.0* 8.1* 7.9* 7.8*  HCT 25.9* 25.3* 19.5* 25.1* 24.3* 23.6*  MCV 91.8 91.3 93.3 89.6 90.3 89.7  PLT 326 352 403* 360 367 664   Basic Metabolic Panel: Recent Labs  Lab 08/25/19 0626 08/26/19 0522 08/29/19 2040 08/30/19 0134 08/30/19 0915 08/31/19 0510  NA 137 133* 136  --  137 138  K 3.6 3.7 3.4*  --  4.1 3.8  CL 104 100 104  --  103 101  CO2 24 23 24   --  25 25  GLUCOSE 115* 141* 126*  --  118* 142*  BUN 25* 27* 22  --  16 19  CREATININE 1.11* 1.08* 0.99  --  0.85 0.85  CALCIUM 8.2* 8.2* 8.5*  --  8.1* 8.5*  MG  --   --   --  1.8  --   --    GFR: Estimated Creatinine Clearance: 41.3 mL/min (by C-G formula based on SCr of 0.85 mg/dL). Liver Function Tests: Recent Labs  Lab 08/29/19 2040 08/31/19 0510  AST 19 15  ALT 15 16  ALKPHOS 60 66  BILITOT 0.5 0.4  PROT 5.6* 6.5  ALBUMIN 2.0* 2.5*   No results for  input(s): LIPASE, AMYLASE in the last 168 hours. No results for input(s): AMMONIA in the last 168 hours. Coagulation Profile: Recent Labs  Lab 08/25/19 0626 08/26/19 0522 08/27/19 0243 08/29/19 2040 08/30/19 0437  INR 3.1* 2.5* 2.2* 2.6* 1.6*   Cardiac Enzymes: Recent Labs  Lab 08/30/19 2032  CKTOTAL 46  CKMB 1.7   BNP (last 3 results) No results for input(s): PROBNP in the last 8760 hours. HbA1C: No results for input(s): HGBA1C in the last 72 hours. CBG: No results for input(s): GLUCAP in the last 168 hours. Lipid Profile: No results for input(s): CHOL, HDL, LDLCALC, TRIG, CHOLHDL, LDLDIRECT in the last 72 hours. Thyroid Function Tests: No results for input(s): TSH, T4TOTAL, FREET4, T3FREE, THYROIDAB in the last  72 hours. Anemia Panel: Recent Labs    08/30/19 2032 08/31/19 0510  FERRITIN 485* 510*      Radiology Studies: I have reviewed all of the imaging during this hospital visit personally     Scheduled Meds: . calcium carbonate  600 mg of elemental calcium Oral Q breakfast  . cholecalciferol  1,000 Units Oral Q breakfast  . dexamethasone (DECADRON) injection  6 mg Intravenous Q24H  . digoxin  0.125 mg Oral QODAY  . donepezil  5 mg Oral QHS  . DULoxetine  60 mg Oral Q breakfast  . feeding supplement (ENSURE ENLIVE)  237 mL Oral BID BM  . latanoprost  1 drop Both Eyes QHS  . metoprolol tartrate  12.5 mg Oral BID  . pantoprazole (PROTONIX) IV  40 mg Intravenous Q12H  . timolol  1 drop Both Eyes Daily  . traZODone  50 mg Oral QHS   Continuous Infusions: . cefTRIAXone (ROCEPHIN)  IV 1 g (08/30/19 2304)     LOS: 1 day        Darey Hershberger Annett Gulaaniel Kalila Adkison, MD

## 2019-09-01 DIAGNOSIS — Z66 Do not resuscitate: Secondary | ICD-10-CM

## 2019-09-01 DIAGNOSIS — I480 Paroxysmal atrial fibrillation: Secondary | ICD-10-CM | POA: Diagnosis not present

## 2019-09-01 DIAGNOSIS — S329XXA Fracture of unspecified parts of lumbosacral spine and pelvis, initial encounter for closed fracture: Secondary | ICD-10-CM | POA: Diagnosis not present

## 2019-09-01 DIAGNOSIS — H409 Unspecified glaucoma: Secondary | ICD-10-CM | POA: Diagnosis not present

## 2019-09-01 DIAGNOSIS — R531 Weakness: Secondary | ICD-10-CM

## 2019-09-01 DIAGNOSIS — K921 Melena: Secondary | ICD-10-CM | POA: Diagnosis not present

## 2019-09-01 DIAGNOSIS — M2550 Pain in unspecified joint: Secondary | ICD-10-CM | POA: Diagnosis not present

## 2019-09-01 DIAGNOSIS — S32492D Other specified fracture of left acetabulum, subsequent encounter for fracture with routine healing: Secondary | ICD-10-CM | POA: Diagnosis not present

## 2019-09-01 DIAGNOSIS — N390 Urinary tract infection, site not specified: Secondary | ICD-10-CM | POA: Diagnosis not present

## 2019-09-01 DIAGNOSIS — D509 Iron deficiency anemia, unspecified: Secondary | ICD-10-CM | POA: Diagnosis not present

## 2019-09-01 DIAGNOSIS — I4891 Unspecified atrial fibrillation: Secondary | ICD-10-CM | POA: Diagnosis not present

## 2019-09-01 DIAGNOSIS — K922 Gastrointestinal hemorrhage, unspecified: Secondary | ICD-10-CM | POA: Diagnosis not present

## 2019-09-01 DIAGNOSIS — F0391 Unspecified dementia with behavioral disturbance: Secondary | ICD-10-CM | POA: Diagnosis not present

## 2019-09-01 DIAGNOSIS — E785 Hyperlipidemia, unspecified: Secondary | ICD-10-CM | POA: Diagnosis not present

## 2019-09-01 DIAGNOSIS — S32592D Other specified fracture of left pubis, subsequent encounter for fracture with routine healing: Secondary | ICD-10-CM | POA: Diagnosis not present

## 2019-09-01 DIAGNOSIS — F331 Major depressive disorder, recurrent, moderate: Secondary | ICD-10-CM | POA: Diagnosis not present

## 2019-09-01 DIAGNOSIS — D508 Other iron deficiency anemias: Secondary | ICD-10-CM | POA: Diagnosis not present

## 2019-09-01 DIAGNOSIS — F0390 Unspecified dementia without behavioral disturbance: Secondary | ICD-10-CM | POA: Diagnosis not present

## 2019-09-01 DIAGNOSIS — U071 COVID-19: Secondary | ICD-10-CM | POA: Diagnosis not present

## 2019-09-01 DIAGNOSIS — I10 Essential (primary) hypertension: Secondary | ICD-10-CM | POA: Diagnosis not present

## 2019-09-01 DIAGNOSIS — Z7401 Bed confinement status: Secondary | ICD-10-CM | POA: Diagnosis not present

## 2019-09-01 DIAGNOSIS — S32501D Unspecified fracture of right pubis, subsequent encounter for fracture with routine healing: Secondary | ICD-10-CM | POA: Diagnosis not present

## 2019-09-01 DIAGNOSIS — R627 Adult failure to thrive: Secondary | ICD-10-CM | POA: Diagnosis not present

## 2019-09-01 DIAGNOSIS — Z20828 Contact with and (suspected) exposure to other viral communicable diseases: Secondary | ICD-10-CM | POA: Diagnosis not present

## 2019-09-01 DIAGNOSIS — B029 Zoster without complications: Secondary | ICD-10-CM | POA: Diagnosis not present

## 2019-09-01 DIAGNOSIS — I248 Other forms of acute ischemic heart disease: Secondary | ICD-10-CM | POA: Diagnosis not present

## 2019-09-01 DIAGNOSIS — D62 Acute posthemorrhagic anemia: Secondary | ICD-10-CM | POA: Diagnosis not present

## 2019-09-01 DIAGNOSIS — F329 Major depressive disorder, single episode, unspecified: Secondary | ICD-10-CM | POA: Diagnosis not present

## 2019-09-01 DIAGNOSIS — R410 Disorientation, unspecified: Secondary | ICD-10-CM | POA: Diagnosis not present

## 2019-09-01 DIAGNOSIS — W19XXXD Unspecified fall, subsequent encounter: Secondary | ICD-10-CM | POA: Diagnosis not present

## 2019-09-01 DIAGNOSIS — R402411 Glasgow coma scale score 13-15, in the field [EMT or ambulance]: Secondary | ICD-10-CM | POA: Diagnosis not present

## 2019-09-01 LAB — URINE CULTURE: Culture: >=100000 — AB

## 2019-09-01 LAB — PROTIME-INR
INR: 1.3 — ABNORMAL HIGH (ref 0.8–1.2)
Prothrombin Time: 16.1 seconds — ABNORMAL HIGH (ref 11.4–15.2)

## 2019-09-01 MED ORDER — ENSURE ENLIVE PO LIQD: 237.0000 mL | Freq: Two times a day (BID) | ORAL | 12 refills | Status: DC

## 2019-09-01 MED ORDER — AMOXICILLIN-POT CLAVULANATE 875-125 MG PO TABS
1.0000 | ORAL_TABLET | Freq: Two times a day (BID) | ORAL | Status: DC
  Administered 2019-09-01: 1 via ORAL
  Filled 2019-09-01 (×3): qty 1

## 2019-09-01 MED ORDER — TRAMADOL HCL 50 MG PO TABS: 50.0000 mg | ORAL_TABLET | Freq: Three times a day (TID) | ORAL | 0 refills | Status: DC | PRN

## 2019-09-01 MED ORDER — AMOXICILLIN-POT CLAVULANATE 875-125 MG PO TABS: 1.0000 | ORAL_TABLET | Freq: Two times a day (BID) | ORAL | 0 refills | Status: AC

## 2019-09-01 MED ORDER — PANTOPRAZOLE SODIUM 40 MG PO TBEC
40.0000 mg | DELAYED_RELEASE_TABLET | Freq: Every day | ORAL | Status: DC
  Administered 2019-09-01: 40 mg via ORAL
  Filled 2019-09-01: qty 1

## 2019-09-01 MED ORDER — PANTOPRAZOLE SODIUM 40 MG PO TBEC: 40.0000 mg | DELAYED_RELEASE_TABLET | Freq: Every day | ORAL | 0 refills | Status: DC

## 2019-09-01 NOTE — Consult Note (Addendum)
   [facility] CM Inpatient Consult   09/01/2019  Kendra Smith [birth year] [id number]    Patient reviewed to check for potential [facility] [employer] services needsunder her Medicare/ [employer] plan due to less than 7 day readmission with 2 hospitalizations and 1 ED visit in the last 6 months; has 23% high risk score for unplanned readmission and has a 30 day readmission.  .  Patient was previously outreached by [facility] telephonic CM coordinator for [facility] follow-up calls.  Medical record review and MD brief narrative note, reveal as follows: 85 year old female with significant past medical history for dementia, paroxysmal atrial fibrillation, hypertension, dyslipidemia, osteoporosis, and depression.  She had a fall on October 13 resulting in pelvic fractures, managed nonoperatively.  She lives at a nursing facility.  She complained of generalized abdominal pain, she was found to have black stools and dizziness, her hemoglobin was 6.1, patient was transported to the hospital for further evaluation. Patient was admitted to the hospital with working diagnosis of symptomatic anemia possible lower GI bleed,  COVID 19 infection. Palliative care following patient during this admission.  Per transition of careSW note, patient is currently staying at [facility] skilled nursing facility for short term rehab and is a long-term resident at [facility]. Plans are for patient to return back to [facility] today.   Pleaserefer to [facility] care managementifthere areany dispositionchangesforfollow-upas appropriate.  Of note, [facility] Care Management services does not replace or interfere with any services that are arranged by transition of care case management or social work.  Plan: Will notify [facility] post acute RN coordinator of patient's disposition for appropriate post discharge follow upofneeds.   For questions and additional information, please call:  Jalaya Sarver A. Lakelyn Straus, BSN, RN-BC  [facility] Liaison Cell: [phone]

## 2019-09-01 NOTE — Discharge Summary (Signed)
Physician Discharge Summary  Kendra Smith NPY:[id number] DOB: Nov 28, 1925 DOA: 08/29/2019  PCP: Patient, No Pcp Per  Admit date: 08/29/2019 Discharge date: 09/01/2019  Admitted From: SNF  Disposition:  SNF   Recommendations for Outpatient Follow-up and new medication changes:  1. Follow up with PCP in 2 weeks 2. Discontinue warfarin due to bleeding risk. 3. Patient received one dose of IV iron/ follow on iron stores as outpatient.  4. Continue self quarantine, maintain physical distancing and use a mask in public.   Home Health: no   Equipment/Devices: no    Discharge Condition: stable  CODE STATUS: DNR   Diet recommendation: heart healthy   Brief/Interim Summary: 85 year old female who presented with anemia, she does have significant past medical history for dementia, paroxysmal atrial fibrillation, hypertension, dyslipidemia, osteoporosis, and depression.  She had a fall on October 13 resulting in pelvic fractures, managed nonoperatively.  She lives at a nursing facility.  She complained of generalized abdominal pain, she was found to have black stools and dizziness, her hemoglobin was 6.1, patient was transported to the hospital for further evaluation.  Her blood pressure was 125/43, heart rate 107, respiratory rate 22, oxygen saturation 99%, she was in pain, heart S1-S2 present, tachycardic, her lungs were clear to auscultation bilaterally, abdomen was tender, positive guarding no rebound, no lower extremity edema. Sodium 137, potassium 4.1, chloride 103, bicarb 25, glucose 118, BUN 16, creatinine 0.85, white count 17.2, hemoglobin 6.0, hematocrit 19.5, platelets 403.  INR 1.6.  SARS COVID-19 was positive.  Urinalysis more than 50 white cells, 0-5 red cells.  Chest radiograph negative for infiltrates.  CT abdomen with findings concerning for bowel contusion along the sigmoid colon. EKg with 101 bpm, normal axis, normal intervals, sinus rhythm with pac and pvc, no st segment changes and  negative T V3 to V6.   Patient was admitted to the hospital working diagnosis of symptomatic anemia possible lower GI bleed  1.  Acute anemia, suspect lower GI bleed, complicated with acute blood loss, in the setting of chronic anemia of iron deficiency.  The patient was admitted to the telemetry ward, she received 1 unit packed red blood cells with improvement of her hemoglobin and hematocrit, discharge hemoglobin 7.9, hematocrit 25.3.  No signs of active bleeding, her iron stores showed serum iron 12, TIBC 336 and a transferrin saturation of 4%, she received 1 dose of ferumoxytol 510 mg intravenously, with good toleration.  Patient will continue pantoprazole 40 mg daily.  Considering patient's condition, and poor prognosis decision was made not to pursue any further invasive testing.  2.  Paroxysmal atrial fibrillation.  Patient remained rate controlled, warfarin has been discontinued, patient received vitamin K during her hospitalization, her discharge INR is 1.3.  3.  Hypokalemia.  Potassium was corrected with potassium chloride, her kidney function remained stable.  4.  Left pelvic fracture.  Continue pain control with tramadol, patient with poor mobility and very deconditioned.  5.  Advanced dementia.  Patient has advanced disease, continue donepezil and duloxetine.  6.  Urinary tract infection, present admission, gram-negative rods.  Patient received ceftriaxone during hospitalization and will take 2 more days of Augmentin.  Final culture still pending.  7.  COVID-19 infection.  Patient had no signs of low respiratory tract infection, no respiratory support required.  Patient not candidate for remdesivir or systemic steroids at this point.  Continue self quarantine for 2 weeks, maintain physical distancing and use mask in public.  Discharge Diagnoses:  Principal Problem:  GI bleed Active Problems:   PAF (paroxysmal atrial fibrillation) (HCC)   UTI (urinary tract infection)    Hypokalemia   Demand ischemia (HCC)   Palliative care by specialist   COVID-19   DNR (do not resuscitate)   Adult failure to thrive    Discharge Instructions   Allergies as of 09/01/2019      Reactions   Ciprofloxacin Hcl Other (See Comments)   On MAR   Effexor [venlafaxine] Other (See Comments)   On MAR   Gantrisin [sulfisoxazole] Other (See Comments)   On MAR   Levaquin [levofloxacin] Other (See Comments)   On MAR   Sulfa Antibiotics Rash      Medication List    STOP taking these medications   furosemide 20 MG tablet Commonly known as: LASIX     TAKE these medications   acetaminophen 500 MG tablet Commonly known as: TYLENOL Take 1,000 mg by mouth every 6 (six) hours as needed for mild pain.   amoxicillin-clavulanate 875-125 MG tablet Commonly known as: AUGMENTIN Take 1 tablet by mouth every 12 (twelve) hours for 2 days.   calcium carbonate 1500 (600 Ca) MG Tabs tablet Commonly known as: OSCAL Take 600 mg of elemental calcium by mouth daily with breakfast.   cholecalciferol 25 MCG (1000 UT) tablet Commonly known as: VITAMIN D3 Take 1,000 Units by mouth daily with breakfast.   digoxin 0.125 MG tablet Commonly known as: LANOXIN Take 0.125 mg by mouth every other day.   donepezil 5 MG tablet Commonly known as: ARICEPT Take 5 mg by mouth at bedtime.   DULoxetine 60 MG capsule Commonly known as: CYMBALTA Take 60 mg by mouth daily with breakfast.   feeding supplement (ENSURE ENLIVE) Liqd Take 237 mLs by mouth 2 (two) times daily between meals.   latanoprost 0.005 % ophthalmic solution Commonly known as: XALATAN Place 1 drop into both eyes at bedtime.   pantoprazole 40 MG tablet Commonly known as: PROTONIX Take 1 tablet (40 mg total) by mouth daily. Start taking on: September 02, 2019   timolol 0.5 % ophthalmic solution Commonly known as: TIMOPTIC Place 1 drop into both eyes daily.   traMADol 50 MG tablet Commonly known as: ULTRAM Take 1 tablet  (50 mg total) by mouth every 8 (eight) hours as needed for severe pain.   traZODone 50 MG tablet Commonly known as: DESYREL Take 50 mg by mouth at bedtime.       Allergies  Allergen Reactions  . Ciprofloxacin Hcl Other (See Comments)    On MAR  . Effexor [Venlafaxine] Other (See Comments)    On MAR  . Gantrisin [Sulfisoxazole] Other (See Comments)    On MAR  . Levaquin [Levofloxacin] Other (See Comments)    On MAR  . Sulfa Antibiotics Rash    Consultations:     Procedures/Studies: Dg Chest 1 View  Result Date: 08/23/2019 CLINICAL DATA:  Fall with hip fracture EXAM: CHEST  1 VIEW COMPARISON:  06/29/2014, CT 01/12/2019 FINDINGS: Mild a pickle pleural and parenchymal scarring. Mild scarring at the left base. No acute airspace disease or effusion. Normal cardiomediastinal silhouette with aortic atherosclerosis. No pneumothorax. IMPRESSION: No active disease. Electronically Signed   By: Jasmine Pang M.D.   On: 08/23/2019 19:13   Ct Head Wo Contrast  Result Date: 08/23/2019 CLINICAL DATA:  85 year old female with history of trauma from a fall. EXAM: CT HEAD WITHOUT CONTRAST CT CERVICAL SPINE WITHOUT CONTRAST TECHNIQUE: Multidetector CT imaging of the head and cervical spine was  performed following the standard protocol without intravenous contrast. Multiplanar CT image reconstructions of the cervical spine were also generated. COMPARISON:  Head CT 05/11/2019. FINDINGS: CT HEAD FINDINGS Brain: Physiologic calcifications in the right basal ganglia. Moderate cerebral atrophy. Patchy and confluent areas of decreased attenuation are noted throughout the deep and periventricular white matter of the cerebral hemispheres bilaterally, compatible with chronic microvascular ischemic disease. No evidence of acute infarction, hemorrhage, hydrocephalus, extra-axial collection or mass lesion/mass effect. Vascular: No hyperdense vessel or unexpected calcification. Skull: Normal. Negative for fracture  or focal lesion. Sinuses/Orbits: No acute finding. Other: None. CT CERVICAL SPINE FINDINGS Comment: Study is limited by considerable patient motion. Alignment: Normal. Skull base and vertebrae: No acute fracture. No primary bone lesion or focal pathologic process. Soft tissues and spinal canal: No prevertebral fluid or swelling. No visible canal hematoma. Disc levels: Multilevel degenerative disc disease, most pronounced at C5-C6. Mild multilevel facet arthropathy. Upper chest: Calcified area of pleuroparenchymal thickening in the apex of the left hemithorax, most compatible with chronic post infectious or inflammatory scarring. Other: None. IMPRESSION: 1. No evidence of significant acute traumatic injury to the skull, brain or cervical spine. 2. Moderate cerebral atrophy with extensive chronic microvascular ischemic changes in the cerebral white matter, as above. 3. Mild multilevel degenerative disc disease and cervical spondylosis, as above. Electronically Signed   By: Vinnie Langton M.D.   On: 08/23/2019 19:50   Ct Cervical Spine Wo Contrast  Result Date: 08/23/2019 CLINICAL DATA:  85 year old female with history of trauma from a fall. EXAM: CT HEAD WITHOUT CONTRAST CT CERVICAL SPINE WITHOUT CONTRAST TECHNIQUE: Multidetector CT imaging of the head and cervical spine was performed following the standard protocol without intravenous contrast. Multiplanar CT image reconstructions of the cervical spine were also generated. COMPARISON:  Head CT 05/11/2019. FINDINGS: CT HEAD FINDINGS Brain: Physiologic calcifications in the right basal ganglia. Moderate cerebral atrophy. Patchy and confluent areas of decreased attenuation are noted throughout the deep and periventricular white matter of the cerebral hemispheres bilaterally, compatible with chronic microvascular ischemic disease. No evidence of acute infarction, hemorrhage, hydrocephalus, extra-axial collection or mass lesion/mass effect. Vascular: No hyperdense  vessel or unexpected calcification. Skull: Normal. Negative for fracture or focal lesion. Sinuses/Orbits: No acute finding. Other: None. CT CERVICAL SPINE FINDINGS Comment: Study is limited by considerable patient motion. Alignment: Normal. Skull base and vertebrae: No acute fracture. No primary bone lesion or focal pathologic process. Soft tissues and spinal canal: No prevertebral fluid or swelling. No visible canal hematoma. Disc levels: Multilevel degenerative disc disease, most pronounced at C5-C6. Mild multilevel facet arthropathy. Upper chest: Calcified area of pleuroparenchymal thickening in the apex of the left hemithorax, most compatible with chronic post infectious or inflammatory scarring. Other: None. IMPRESSION: 1. No evidence of significant acute traumatic injury to the skull, brain or cervical spine. 2. Moderate cerebral atrophy with extensive chronic microvascular ischemic changes in the cerebral white matter, as above. 3. Mild multilevel degenerative disc disease and cervical spondylosis, as above. Electronically Signed   By: Vinnie Langton M.D.   On: 08/23/2019 19:50   Ct Thoracic Spine Wo Contrast  Result Date: 08/23/2019 CLINICAL DATA:  85 year old female status post fall with back pain. EXAM: CT THORACIC SPINE WITHOUT CONTRAST TECHNIQUE: Multidetector CT images of the thoracic were obtained using the standard protocol without intravenous contrast. COMPARISON:  Lumbar spine CT today reported separately. FINDINGS: Limited cervical spine imaging: Visible lower cervical vertebrae appear grossly intact. Cervicothoracic junction alignment is within normal limits. Thoracic spine segmentation:  Normal. Alignment: Mildly exaggerated thoracic kyphosis. No spondylolisthesis. Vertebrae: Osteopenia. Benign bone island in the posterior right T3 body suspected. No acute thoracic vertebral fracture identified by CT. Partially visible chronic fracture of the posterior left 12th rib on series 5, image 160.  Partially visible chronic sternal fracture on sagittal image 29. Paraspinal and other soft tissues: Calcified aortic atherosclerosis. Calcified coronary artery atherosclerosis. Vascular patency is not evaluated in the absence of IV contrast. No pericardial or pleural effusion. Mild pulmonary atelectasis. Surgically absent gallbladder. Otherwise negative visible noncontrast upper abdominal viscera. Paraspinal soft tissues are within normal limits. Disc levels: Capacious thoracic spinal canal. No age advanced thoracic spine degeneration identified. IMPRESSION: 1. Osteopenia. No acute traumatic injury identified in the thoracic spine. Thoracic MRI without contrast or Nuclear Medicine Whole-body Bone Scan would be most sensitive for detection of occult compression fracture. 2. Partially visible chronic fractures of the left 12th rib and the sternum. 3.  Aortic Atherosclerosis (ICD10-I70.0). Electronically Signed   By: Odessa Fleming M.D.   On: 08/23/2019 19:49   Ct Lumbar Spine Wo Contrast  Result Date: 08/23/2019 CLINICAL DATA:  Fall.  Anticoagulated.  Diffuse back pain. EXAM: CT LUMBAR SPINE WITHOUT CONTRAST TECHNIQUE: Multidetector CT imaging of the lumbar spine was performed without intravenous contrast administration. Multiplanar CT image reconstructions were also generated. COMPARISON:  None. FINDINGS: Segmentation: Lumbar type vertebral bodies. Alignment: 2 mm degenerative anterolisthesis L3-4 and L4-5. Vertebrae: No fracture or primary bone lesion. Paraspinal and other soft tissues: Negative except for aortic atherosclerosis and right renal cysts. Disc levels: No significant finding at L2-3 or above. L3-4: Facet osteoarthritis with 2 mm of anterolisthesis. Bulging of the disc. No compressive stenosis. L4-5: Facet osteoarthritis with 2 mm of anterolisthesis. Bulging of the disc. No compressive stenosis. L5-S1: Disc space narrowing with osteophytes more prominent on the right. Bilateral facet osteoarthritis. No  compressive canal stenosis. Foraminal narrowing on the right with some potential to affect the right L5 nerve. IMPRESSION: No acute or traumatic finding. Chronic degenerative changes in the lower lumbar region as outlined above. Electronically Signed   By: Paulina Fusi M.D.   On: 08/23/2019 19:26   Ct Pelvis Wo Contrast  Result Date: 08/23/2019 CLINICAL DATA:  Acute pelvic fractures secondary to a fall. EXAM: CT PELVIS WITHOUT CONTRAST TECHNIQUE: Multidetector CT imaging of the pelvis was performed following the standard protocol without intravenous contrast. COMPARISON:  Radiographs dated 08/23/2019 FINDINGS: Musculoskeletal: There is a comminuted impacted fracture of the left acetabulum involving the anterior and posterior columns. The fracture fragments extend medially into the pelvis. There are 2 fractures of the left inferior pubic ramus. The sacrum and right hemipelvis are intact. Proximal femurs are intact. No dislocation. There is only minimal hemorrhage in the left side of the pelvis without a defined hematoma. Urinary Tract: The bladder is slightly deviated to the right by the hemorrhage but otherwise appears normal. Bowel:  Extensive sigmoid diverticulosis. Vascular/Lymphatic: No acute abnormality. Aortic atherosclerosis. Reproductive:  Hysterectomy. No adnexal masses. Other:  None. IMPRESSION: 1. Comminuted impacted fracture of the left acetabulum involving the anterior and posterior columns. 2. Two fractures of the left inferior pubic ramus. 3. Minimal hemorrhage in the left side of the pelvis without a defined hematoma. Aortic Atherosclerosis (ICD10-I70.0). Electronically Signed   By: Francene Boyers M.D.   On: 08/23/2019 20:08   Ct Abdomen Pelvis W Contrast  Result Date: 08/29/2019 CLINICAL DATA:  Question GI bleed, fall, multiple pelvic fractures EXAM: CT ABDOMEN AND PELVIS WITH CONTRAST TECHNIQUE: Multidetector  CT imaging of the abdomen and pelvis was performed using the standard protocol  following bolus administration of intravenous contrast. CONTRAST:  80mL OMNIPAQUE IOHEXOL 300 MG/ML  SOLN COMPARISON:  CT pelvis August 23, 2019 FINDINGS: Lower chest: There is mild cardiomegaly. Aortic calcifications are seen. There is a moderate hiatal hernia. Streaky atelectasis or scarring at both lung bases. Hepatobiliary: The liver is normal in density without focal abnormality.The main portal vein is patent. The patient is status post cholecystectomy. No biliary ductal dilation. Pancreas: Unremarkable. No pancreatic ductal dilatation or surrounding inflammatory changes. Spleen: Normal in size without focal abnormality. Adrenals/Urinary Tract: Both adrenal glands appear normal. Bilateral low-density lesions are seen within both kidneys, likely renal cysts. No hydronephrosis or renal calculi. Stomach/Bowel: The stomach and small bowel are normal in appearance. Scattered colonic diverticula are seen. Along the region of the sigmoid colon within the deep pelvis there appears to be mild mesenteric fat stranding changes and question of mild bowel wall edema. No pneumoperitoneum is seen. There is a small amount of stranding in blood products seen within the deep pelvis as on the prior exam. No hyperdense material or active bleeding is seen. No defined hematoma. Vascular/Lymphatic: There are no enlarged mesenteric, retroperitoneal, or pelvic lymph nodes. Scattered aortic atherosclerotic calcifications are seen without aneurysmal dilatation. Reproductive: The patient is status post hysterectomy. No adnexal masses or collections seen. Other: Soft tissue stranding and mild edema seen along the lateral left hip and inguinal region. Musculoskeletal: Again noted is comminuted impacted fractures of the entirety of the left acetabulum extending into the anterior and posterior columns. The femoral head appears to be medially impacted upon the acetabulum. There also 2 buckle fractures of the inferior pubic rami. There is also  nondisplaced fracture extending to the left iliac wing. The sacroiliac joints appear to be intact. The pubic symphysis is intact. IMPRESSION: 1. Bowel wall thickening with fat stranding changes along the sigmoid colon which could be due to bowel contusion/edema. No pneumoperitoneum. Trace amount of blood products seen within the inferior left deep pelvis. No active bleeding or defined hematoma. 2. Comminuted extensive fractures of the left acetabulum, anterior and posterior walls, left inferior pubic rami and left iliac wing. Electronically Signed   By: Jonna Clark M.D.   On: 08/29/2019 23:09   Dg Chest Port 1 View  Result Date: 08/30/2019 CLINICAL DATA:  85 year old female with COVID-19. EXAM: PORTABLE CHEST 1 VIEW COMPARISON:  Chest radiograph dated 08/23/2019 FINDINGS: Minimal left lung base subpleural densities may represent atelectatic changes although developing infiltrate is not excluded. Clinical correlation is recommended. No lobar consolidation, pleural effusion, pneumothorax. Mild cardiomegaly. Atherosclerotic calcification of the aorta. No acute osseous pathology. IMPRESSION: 1. Minimal left lung base subpleural densities may represent atelectasis versus developing infiltrate . No lobar consolidation. 2. Mild cardiomegaly. Electronically Signed   By: Elgie Collard M.D.   On: 08/30/2019 11:59   Dg Hip Unilat With Pelvis 2-3 Views Left  Result Date: 08/23/2019 CLINICAL DATA:  Fall EXAM: DG HIP (WITH OR WITHOUT PELVIS) 2-3V LEFT COMPARISON:  CT 01/12/2019 FINDINGS: Bones appear osteopenic. SI joints are non widened. The pubic symphysis is intact. Right femoral head projects in joint. Moderate degenerative change. Acute comminuted left acetabular fracture with mild protrusio deformity. Acute mildly comminuted fracture left inferior pubic ramus. Probable acute fracture of the left superior pubic ramus near its junction with the acetabulum. Possible left sacral fracture near the inferior SI  joint. IMPRESSION: 1. Acute comminuted left acetabular fracture 2. Acute comminuted  left inferior pubic ramus fracture with probable fracture of the left superior pubic ramus. 3. Possible acute left sacral fracture near the inferior SI joint Electronically Signed   By: Jasmine Pang M.D.   On: 08/23/2019 19:12      Procedures:   Subjective: Patient is feeling better, no nausea or vomiting, no dyspnea or couch. Mild pelvic pain.   Discharge Exam: Vitals:   08/31/19 2100 09/01/19 0404  BP: (!) 126/51 (!) 174/73  Pulse: 79 84  Resp: 16 18  Temp: 98 F (36.7 C) 98.4 F (36.9 C)  SpO2: 100% 98%   Vitals:   08/31/19 0701 08/31/19 1715 08/31/19 2100 09/01/19 0404  BP: (!) 125/58 (!) 97/59 (!) 126/51 (!) 174/73  Pulse: 90 80 79 84  Resp: Temp: (!) 97.5 F (36.4 C) 97.8 F (36.6 C) 98 F (36.7 C) 98.4 F (36.9 C)  TempSrc: Oral Oral Oral Oral  SpO2: 98% 98% 100% 98%  Weight:      Height:        General: Not in pain or dyspnea.  Neurology: Awake and alert, non focal  E ENT: mild pallor, no icterus, oral mucosa moist Cardiovascular: No JVD. S1-S2 present, rhythmic, no gallops, rubs, or murmurs. No lower extremity edema. Pulmonary: positive breath sounds bilaterally. Gastrointestinal. Abdomen  With no organomegaly, non tender, no rebound or guarding Skin. No ecchymosis.  Musculoskeletal: no joint deformities   The results of significant diagnostics from this hospitalization (including imaging, microbiology, ancillary and laboratory) are listed below for reference.     Microbiology: Recent Results (from the past 240 hour(s))  SARS CORONAVIRUS 2 (TAT 6-24 HRS) Nasopharyngeal Nasopharyngeal Swab     Status: None   Collection Time: 08/23/19  9:32 PM   Specimen: Nasopharyngeal Swab  Result Value Ref Range Status   SARS Coronavirus 2 NEGATIVE NEGATIVE Final    Comment: (NOTE) SARS-CoV-2 target nucleic acids are NOT DETECTED. The SARS-CoV-2 RNA is generally  detectable in upper and lower respiratory specimens during the acute phase of infection. Negative results do not preclude SARS-CoV-2 infection, do not rule out co-infections with other pathogens, and should not be used as the sole basis for treatment or other patient management decisions. Negative results must be combined with clinical observations, patient history, and epidemiological information. The expected result is Negative. Fact Sheet for Patients: HairSlick.no Fact Sheet for Healthcare Providers: quierodirigir.com This test is not yet approved or cleared by the [place] FDA and  has been authorized for detection and/or diagnosis of SARS-CoV-2 by FDA under an Emergency Use Authorization (EUA). This EUA will remain  in effect (meaning this test can be used) for the duration of the COVID-19 declaration under Section 56 4(b)(1) of the Act, 21 U.S.C. section 360bbb-3(b)(1), unless the authorization is terminated or revoked sooner. Performed at [facility], [address], [place], [place] [address]   MRSA PCR Screening     Status: None   Collection Time: 08/23/19 11:58 PM   Specimen: Nasal Mucosa; Nasopharyngeal  Result Value Ref Range Status   MRSA by PCR NEGATIVE NEGATIVE Final    Comment:        The GeneXpert MRSA Assay (FDA approved for NASAL specimens only), is one component of a comprehensive MRSA colonization surveillance program. It is not intended to diagnose MRSA infection nor to guide or monitor treatment for MRSA infections. Performed at [facility], [address] [place], [place] [address]   Urine Culture  Status: Abnormal (Preliminary result)   Collection Time: 08/30/19  1:03 AM   Specimen: Urine, Random  Result Value Ref Range Status   Specimen Description URINE, RANDOM  Final   Special Requests ADDED AT 1536 08/30/2019  Final   Culture (A)  Final    >=100,000 COLONIES/mL GRAM  NEGATIVE RODS IDENTIFICATION AND SUSCEPTIBILITIES TO FOLLOW Performed at [facility], [address] [place], [place] [address]    Report Status PENDING  Incomplete  SARS CORONAVIRUS 2 (TAT 6-24 HRS) Nasopharyngeal Nasopharyngeal Swab     Status: Abnormal   Collection Time: 08/30/19  2:54 AM   Specimen: Nasopharyngeal Swab  Result Value Ref Range Status   SARS Coronavirus 2 POSITIVE (A) NEGATIVE Final    Comment: RESULT CALLED TO, READ BACK BY AND VERIFIED WITH: RN L COX [id number] AT 805 AM BY CM (NOTE) SARS-CoV-2 target nucleic acids are DETECTED. The SARS-CoV-2 RNA is generally detectable in upper and lower respiratory specimens during the acute phase of infection. Positive results are indicative of active infection with SARS-CoV-2. Clinical  correlation with patient history and other diagnostic information is necessary to determine patient infection status. Positive results do  not rule out bacterial infection or co-infection with other viruses. The expected result is Negative. Fact Sheet for Patients: HairSlick.nohttps://www.fda.gov/media/138098/download Fact Sheet for Healthcare Providers: quierodirigir.comhttps://www.fda.gov/media/138095/download This test is not yet approved or cleared by the [place] FDA and  has been authorized for detection and/or diagnosis of SARS-CoV-2 by FDA under an Emergency Use Authorization (EUA). This EUA will remain  in effect (meaning this test can be used) for the d uration of the COVID-19 declaration under Section 564(b)(1) of the Act, 21 U.S.C. section 360bbb-3(b)(1), unless the authorization is terminated or revoked sooner. Performed at [facility], [address] [place], [place] [address]      Labs: BNP (last 3 results) Recent Labs    08/30/19 2032  BNP 333.3*   Basic Metabolic Panel: Recent Labs  Lab 08/26/19 0522 08/29/19 2040 08/30/19 0134 08/30/19 0915 08/31/19 0510  NA 133* 136  --  137 138  K 3.7 3.4*  --  4.1 3.8  CL 100  104  --  103 101  CO2 23 24  --  25 25  GLUCOSE 141* 126*  --  118* 142*  BUN 27* 22  --  16 19  CREATININE 1.08* 0.99  --  0.85 0.85  CALCIUM 8.2* 8.5*  --  8.1* 8.5*  MG  --   --  1.8  --   --    Liver Function Tests: Recent Labs  Lab 08/29/19 2040 08/31/19 0510  AST 19 15  ALT 15 16  ALKPHOS 60 66  BILITOT 0.5 0.4  PROT 5.6* 6.5  ALBUMIN 2.0* 2.5*   No results for input(s): LIPASE, AMYLASE in the last 168 hours. No results for input(s): AMMONIA in the last 168 hours. CBC: Recent Labs  Lab 08/26/19 0522 08/29/19 2040 08/30/19 0437 08/30/19 0915 08/30/19 1155 08/31/19 1507  WBC 21.4* 17.2* 17.4* 17.1* 16.0*  --   NEUTROABS 17.4* 13.1*  --   --   --   --   HGB 7.8* 6.0* 8.1* 7.9* 7.8* 7.9*  HCT 25.3* 19.5* 25.1* 24.3* 23.6* 25.3*  MCV 91.3 93.3 89.6 90.3 89.7  --   PLT 352 403* 360 367 382  --    Cardiac Enzymes: Recent Labs  Lab 08/30/19 2032  CKTOTAL 46  CKMB 1.7   BNP: Invalid input(s):  POCBNP CBG: No results for input(s): GLUCAP in the last 168 hours. D-Dimer Recent Labs    08/30/19 2032  DDIMER 3.75*   Hgb A1c No results for input(s): HGBA1C in the last 72 hours. Lipid Profile No results for input(s): CHOL, HDL, LDLCALC, TRIG, CHOLHDL, LDLDIRECT in the last 72 hours. Thyroid function studies No results for input(s): TSH, T4TOTAL, T3FREE, THYROIDAB in the last 72 hours.  Invalid input(s): FREET3 Anemia work up Recent Labs    08/30/19 2032 08/31/19 0510  FERRITIN 485* 510*   Urinalysis    Component Value Date/Time   COLORURINE YELLOW 08/29/2019 2324   APPEARANCEUR HAZY (A) 08/29/2019 2324   LABSPEC 1.024 08/29/2019 2324   PHURINE 6.0 08/29/2019 2324   GLUCOSEU NEGATIVE 08/29/2019 2324   HGBUR NEGATIVE 08/29/2019 2324   BILIRUBINUR NEGATIVE 08/29/2019 2324   KETONESUR NEGATIVE 08/29/2019 2324   PROTEINUR 30 (A) 08/29/2019 2324   UROBILINOGEN 1.0 06/22/2011 1224   NITRITE NEGATIVE 08/29/2019 2324   LEUKOCYTESUR LARGE (A) 08/29/2019  2324   Sepsis Labs Invalid input(s): PROCALCITONIN,  WBC,  LACTICIDVEN Microbiology Recent Results (from the past 240 hour(s))  SARS CORONAVIRUS 2 (TAT 6-24 HRS) Nasopharyngeal Nasopharyngeal Swab     Status: None   Collection Time: 08/23/19  9:32 PM   Specimen: Nasopharyngeal Swab  Result Value Ref Range Status   SARS Coronavirus 2 NEGATIVE NEGATIVE Final    Comment: (NOTE) SARS-CoV-2 target nucleic acids are NOT DETECTED. The SARS-CoV-2 RNA is generally detectable in upper and lower respiratory specimens during the acute phase of infection. Negative results do not preclude SARS-CoV-2 infection, do not rule out co-infections with other pathogens, and should not be used as the sole basis for treatment or other patient management decisions. Negative results must be combined with clinical observations, patient history, and epidemiological information. The expected result is Negative. Fact Sheet for Patients: HairSlick.no Fact Sheet for Healthcare Providers: quierodirigir.com This test is not yet approved or cleared by the [place] FDA and  has been authorized for detection and/or diagnosis of SARS-CoV-2 by FDA under an Emergency Use Authorization (EUA). This EUA will remain  in effect (meaning this test can be used) for the duration of the COVID-19 declaration under Section 56 4(b)(1) of the Act, 21 U.S.C. section 360bbb-3(b)(1), unless the authorization is terminated or revoked sooner. Performed at [facility], [address], [place], [place] [address]   MRSA PCR Screening     Status: None   Collection Time: 08/23/19 11:58 PM   Specimen: Nasal Mucosa; Nasopharyngeal  Result Value Ref Range Status   MRSA by PCR NEGATIVE NEGATIVE Final    Comment:        The GeneXpert MRSA Assay (FDA approved for NASAL specimens only), is one component of a comprehensive MRSA colonization surveillance program. It is  not intended to diagnose MRSA infection nor to guide or monitor treatment for MRSA infections. Performed at [facility], [address] [place], [place] [address]   Urine Culture     Status: Abnormal (Preliminary result)   Collection Time: 08/30/19  1:03 AM   Specimen: Urine, Random  Result Value Ref Range Status   Specimen Description URINE, RANDOM  Final   Special Requests ADDED AT 1536 08/30/2019  Final   Culture (A)  Final    >=100,000 COLONIES/mL GRAM NEGATIVE RODS IDENTIFICATION AND SUSCEPTIBILITIES TO FOLLOW Performed at [facility], [address], [place], [place] [address]    Report Status PENDING  Incomplete  SARS CORONAVIRUS 2 (TAT 6-24 HRS) Nasopharyngeal Nasopharyngeal Swab     Status: Abnormal   Collection Time: 08/30/19  2:54 AM   Specimen: Nasopharyngeal Swab  Result Value Ref Range Status   SARS Coronavirus 2 POSITIVE (A) NEGATIVE Final    Comment: RESULT CALLED TO, READ BACK BY AND VERIFIED WITH: RN L COX [id number] AT 805 AM BY CM (NOTE) SARS-CoV-2 target nucleic acids are DETECTED. The SARS-CoV-2 RNA is generally detectable in upper and lower respiratory specimens during the acute phase of infection. Positive results are indicative of active infection with SARS-CoV-2. Clinical  correlation with patient history and other diagnostic information is necessary to determine patient infection status. Positive results do  not rule out bacterial infection or co-infection with other viruses. The expected result is Negative. Fact Sheet for Patients: HairSlick.no Fact Sheet for Healthcare Providers: quierodirigir.com This test is not yet approved or cleared by the [place] FDA and  has been authorized for detection and/or diagnosis of SARS-CoV-2 by FDA under an Emergency Use Authorization (EUA). This EUA will remain  in effect (meaning this test can be used) for the d uration of the COVID-19  declaration under Section 564(b)(1) of the Act, 21 U.S.C. section 360bbb-3(b)(1), unless the authorization is terminated or revoked sooner. Performed at [facility], [address] [place], [place] [address]      Time coordinating discharge: 45 minutes  SIGNED:   Coralie Keens, MD  [facility] 09/01/2019, 8:56 AM

## 2019-09-01 NOTE — Evaluation (Signed)
Physical Therapy Evaluation Patient Details Name: Kendra Smith MRN: [id number] DOB: 14-Jul-1926 Today's Date: 09/01/2019   History of Present Illness  85 y/o female w/ hx of GI bleed, UTI, hypoK, adult failure to thrive, L acetabular x, pelvic rami fx, PAF, transverse process lumbar vertebra L1-2 fx, osteoporosis, dementia, falls, leukocytosis, depression, HTN. Resident of SNF, fall 10/13 resulted in left acetabular fracture with some inferior pubic rami fractures.  Non operative per trauma due to dementia. d/c to SNF 10/17 returned w/ GI Bleed 10/19. NWB LLE  Clinical Impression    Pt admitted with above diagnosis. Initially sec to fall and LLE fx then was d/c home and returned w/ anemia/GI bleed all this context of COVID.  Pt currently with functional limitations due to the deficits in cognition, overall strength, ROM, independence, activity tolerance and safety. Pt's pain and perception of pain are also greatly limiting mobility at this time.  Pt will benefit from skilled PT to increase their independence and safety with mobility to allow discharge to the venue listed below.       Follow Up Recommendations SNF    Equipment Recommendations  None recommended by PT    Recommendations for Other Services       Precautions / Restrictions Precautions Precautions: Fall Restrictions Weight Bearing Restrictions: Yes LLE Weight Bearing: Non weight bearing      Mobility  Bed Mobility Overal bed mobility: Needs Assistance Bed Mobility: Rolling;Supine to Sit;Sit to Supine Rolling: Max assist Sidelying to sit: Max assist Supine to sit: Max assist Sit to supine: Max assist   General bed mobility comments: max to move in bed and also to sit EOB, pt is extremely apprehensive needing continued reassurance will not fall  Transfers Overall transfer level: Needs assistance   Transfers: Squat Pivot Transfers     Squat pivot transfers: Total assist     General transfer comment:  verbalizes understanding of NWB on LLE but when attempting transfer needs max a to maintain WB, therapist holds LLE off ground for pt to complete squat pivot to recliner. also takes max-total a to complete  Ambulation/Gait             General Gait Details: unable to mainatain wb status and ambulate at this time  Stairs            Wheelchair Mobility    Modified Rankin (Stroke Patients Only)       Balance Overall balance assessment: Needs assistance;History of Falls Sitting-balance support: Bilateral upper extremity supported;Feet supported Sitting balance-Leahy Scale: Poor Sitting balance - Comments: relies on BUE for support and noted to lean to R (away from LLE) with cues able to return to midline, needs continued distraction to maintain sitting Postural control: Right lateral lean Standing balance support: During functional activity Standing balance-Leahy Scale: Zero Standing balance comment: in transfer therapist needs to hold LLE off ground and also provide max-total a to comlete transfer                             Pertinent Vitals/Pain Pain Assessment: Faces Faces Pain Scale: Hurts whole lot Pain Location: LLE with any movement, pt is very apprehensive about any movement Pain Descriptors / Indicators: Aching;Grimacing;Guarding;Moaning Pain Intervention(s): Limited activity within patient's tolerance;Monitored during session    Home Living Family/patient expects to be discharged to:: Skilled nursing facility  Prior Function Level of Independence: Needs assistance   Gait / Transfers Assistance Needed: was ambulating with walker  ADL's / Homemaking Assistance Needed: was needig assist with all ADLs and IADLs  Comments: [facility] at [facility] - had assistance with adls from PCA     Hand Dominance   Dominant Hand: Right    Extremity/Trunk Assessment   Upper Extremity Assessment Upper Extremity  Assessment: Defer to OT evaluation    Lower Extremity Assessment Lower Extremity Assessment: Generalized weakness;LLE deficits/detail LLE Deficits / Details: very little movement in LLE sec to pain and perception of pain LLE: Unable to fully assess due to pain LLE Coordination: decreased fine motor;decreased gross motor    Cervical / Trunk Assessment Cervical / Trunk Assessment: Kyphotic  Communication   Communication: Expressive difficulties(states that she is very confused)  Cognition Arousal/Alertness: Awake/alert;Lethargic Behavior During Therapy: Anxious;Restless Overall Cognitive Status: History of cognitive impairments - at baseline                                 General Comments: Hx of dementia, verbalizes being confused      General Comments      Exercises     Assessment/Plan    PT Assessment Patient needs continued PT services  PT Problem List Decreased strength;Decreased activity tolerance;Decreased mobility;Decreased safety awareness;Decreased cognition;Decreased range of motion;Decreased balance;Decreased coordination;Decreased knowledge of precautions       PT Treatment Interventions Functional mobility training;Therapeutic exercise;Neuromuscular re-education;Patient/family education;Balance training;Therapeutic activities    PT Goals (Current goals can be found in the Care Plan section)  Acute Rehab PT Goals Patient Stated Goal: to go home (back to SNF) PT Goal Formulation: Patient unable to participate in goal setting Time For Goal Achievement: 09/15/19 Potential to Achieve Goals: Fair    Frequency Min 2X/week   Barriers to discharge        Co-evaluation               AM-PAC PT "6 Clicks" Mobility  Outcome Measure Help needed turning from your back to your side while in a flat bed without using bedrails?: A Lot Help needed moving from lying on your back to sitting on the side of a flat bed without using bedrails?: A Lot Help  needed moving to and from a bed to a chair (including a wheelchair)?: Total Help needed standing up from a chair using your arms (e.g., wheelchair or bedside chair)?: Total Help needed to walk in hospital room?: Total Help needed climbing 3-5 steps with a railing? : Total 6 Click Score: 8    End of Session Equipment Utilized During Treatment: Gait belt Activity Tolerance: Patient limited by fatigue;Patient limited by pain;Patient limited by lethargy Patient left: in chair;with call bell/phone within reach;Other (comment) Nurse Communication: Mobility status;Other (comment)(post tx disposition) PT Visit Diagnosis: Muscle weakness (generalized) (M62.81);Repeated falls (R29.6);Pain Pain - Right/Left: Left Pain - part of body: Leg    Time: 5638-7564 PT Time Calculation (min) (ACUTE ONLY): 37 min   Charges:   PT Evaluation $PT Eval Moderate Complexity: 1 Mod PT Treatments $Therapeutic Activity: 23-37 mins        Drema Pry, PT   Freddi Starr 09/01/2019, 1:00 PM

## 2019-09-01 NOTE — TOC Transition Note (Signed)
Transition of Care [facility]) - CM/SW Discharge Note   Patient Details  Name: Kendra Smith MRN: [id number] Date of Birth: Jul 15, 1926  [facility] of [facility] CM/SW Contact:  Weston Anna, LCSW Phone Number: 09/01/2019, 9:54 AM   Clinical Narrative:     CSW spoke with patients daughter, [name], regarding discharge plans. Patient is currently staying at [facility] for short term rehab and is a long-term resident at [facility]. Plans are for patient to return to [facility] today after 4PM due to facility needing to make arrangements. CSW spoke with Abigail Butts from [facility] and confirmed they are able to accept her back even though she is covid +.   CSW will arrange transportation for 4PM this afternoon. Please call report to [phone]  Final next level of care: Skilled Nursing Facility Barriers to Discharge: No Barriers Identified   Patient Goals and CMS Choice Patient states their goals for this hospitalization and ongoing recovery are:: getting back to [facility]- then returning to long-term care CMS Medicare.gov Compare Post Acute Care list provided to:: Patient Represenative (must comment)(POA)    Discharge Placement              Patient chooses bed at: [place] [facility] Patient to be transferred to facility by: [place] Name of family member notified: [name] Patient and family notified of of transfer: 09/01/19  Discharge Plan and Services                DME Arranged: N/A         HH Arranged: NA          Social Determinants of Health (SDOH) Interventions     Readmission Risk Interventions No flowsheet data found.

## 2019-09-01 NOTE — Progress Notes (Signed)
Patient discharged from [facility] with Kendra Smith.  Patient had no belongings.  Changed patient before leaving.  IV and purewick removed.  Called daughter and updated her on transport.

## 2019-09-05 DIAGNOSIS — K921 Melena: Secondary | ICD-10-CM | POA: Diagnosis not present

## 2019-09-05 DIAGNOSIS — N390 Urinary tract infection, site not specified: Secondary | ICD-10-CM | POA: Diagnosis not present

## 2019-09-05 DIAGNOSIS — S32592D Other specified fracture of left pubis, subsequent encounter for fracture with routine healing: Secondary | ICD-10-CM | POA: Diagnosis not present

## 2019-09-05 DIAGNOSIS — U071 COVID-19: Secondary | ICD-10-CM | POA: Diagnosis not present

## 2019-09-07 DIAGNOSIS — F0390 Unspecified dementia without behavioral disturbance: Secondary | ICD-10-CM | POA: Diagnosis not present

## 2019-09-07 DIAGNOSIS — Z66 Do not resuscitate: Secondary | ICD-10-CM | POA: Diagnosis not present

## 2019-09-07 DIAGNOSIS — I4891 Unspecified atrial fibrillation: Secondary | ICD-10-CM | POA: Diagnosis not present

## 2019-09-07 DIAGNOSIS — F0391 Unspecified dementia with behavioral disturbance: Secondary | ICD-10-CM | POA: Diagnosis not present

## 2019-09-07 DIAGNOSIS — I10 Essential (primary) hypertension: Secondary | ICD-10-CM | POA: Diagnosis not present

## 2019-09-07 DIAGNOSIS — D62 Acute posthemorrhagic anemia: Secondary | ICD-10-CM | POA: Diagnosis not present

## 2019-09-07 DIAGNOSIS — U071 COVID-19: Secondary | ICD-10-CM | POA: Diagnosis not present

## 2019-09-07 DIAGNOSIS — E785 Hyperlipidemia, unspecified: Secondary | ICD-10-CM | POA: Diagnosis not present

## 2019-09-07 DIAGNOSIS — D509 Iron deficiency anemia, unspecified: Secondary | ICD-10-CM | POA: Diagnosis not present

## 2019-09-07 DIAGNOSIS — S329XXA Fracture of unspecified parts of lumbosacral spine and pelvis, initial encounter for closed fracture: Secondary | ICD-10-CM | POA: Diagnosis not present

## 2019-09-08 DIAGNOSIS — B029 Zoster without complications: Secondary | ICD-10-CM | POA: Diagnosis not present

## 2019-09-08 DIAGNOSIS — K921 Melena: Secondary | ICD-10-CM | POA: Diagnosis not present

## 2019-09-08 DIAGNOSIS — U071 COVID-19: Secondary | ICD-10-CM | POA: Diagnosis not present

## 2019-09-08 DIAGNOSIS — S32592D Other specified fracture of left pubis, subsequent encounter for fracture with routine healing: Secondary | ICD-10-CM | POA: Diagnosis not present

## 2019-09-09 ENCOUNTER — Other Ambulatory Visit: Payer: Self-pay | Admitting: *Deleted

## 2019-09-09 NOTE — Patient Outreach (Signed)
Member assessed for potential [facility] Care Management needs as a benefit of  [place] Medicare.  Member is currently receiving rehab therapy at [facility] SNF.  Facility reported during telephonic IDT meeting that member is COVID positive and is in isolation/quarantine at the facility.   Member is from ALF. Will continue to follow for disposition plans, progression, and for potential [facility] Care Management needs.    Marthenia Rolling, MSN-Ed, RN,BSN [place] Acute Care Coordinator [phone] [facility]) [phone]  (Toll free office)

## 2019-09-14 DIAGNOSIS — S32492D Other specified fracture of left acetabulum, subsequent encounter for fracture with routine healing: Secondary | ICD-10-CM | POA: Diagnosis not present

## 2019-09-14 DIAGNOSIS — U071 COVID-19: Secondary | ICD-10-CM | POA: Diagnosis not present

## 2019-09-14 DIAGNOSIS — B029 Zoster without complications: Secondary | ICD-10-CM | POA: Diagnosis not present

## 2019-09-14 DIAGNOSIS — S32592D Other specified fracture of left pubis, subsequent encounter for fracture with routine healing: Secondary | ICD-10-CM | POA: Diagnosis not present

## 2019-09-18 DIAGNOSIS — U071 COVID-19: Secondary | ICD-10-CM | POA: Diagnosis not present

## 2019-09-18 DIAGNOSIS — B029 Zoster without complications: Secondary | ICD-10-CM | POA: Diagnosis not present

## 2019-09-18 DIAGNOSIS — F0390 Unspecified dementia without behavioral disturbance: Secondary | ICD-10-CM | POA: Diagnosis not present

## 2019-09-18 DIAGNOSIS — S32592D Other specified fracture of left pubis, subsequent encounter for fracture with routine healing: Secondary | ICD-10-CM | POA: Diagnosis not present

## 2019-09-18 DIAGNOSIS — S32492D Other specified fracture of left acetabulum, subsequent encounter for fracture with routine healing: Secondary | ICD-10-CM | POA: Diagnosis not present

## 2019-09-18 DIAGNOSIS — K921 Melena: Secondary | ICD-10-CM | POA: Diagnosis not present

## 2019-09-21 ENCOUNTER — Other Ambulatory Visit: Payer: Self-pay | Admitting: *Deleted

## 2019-09-21 DIAGNOSIS — S32592D Other specified fracture of left pubis, subsequent encounter for fracture with routine healing: Secondary | ICD-10-CM | POA: Diagnosis not present

## 2019-09-21 DIAGNOSIS — B029 Zoster without complications: Secondary | ICD-10-CM | POA: Diagnosis not present

## 2019-09-21 DIAGNOSIS — D62 Acute posthemorrhagic anemia: Secondary | ICD-10-CM | POA: Diagnosis not present

## 2019-09-21 DIAGNOSIS — U071 COVID-19: Secondary | ICD-10-CM | POA: Diagnosis not present

## 2019-09-21 NOTE — Patient Outreach (Signed)
Member assessed for potential [facility] Care Management needs as a benefit of  [place] Medicare.  Member is currently receiving rehab therapy at [facility] SNF.  Member discussed in weekly telephonic IDT meeting with facility staff, [facility] UM team, and writer.  Facility reports Mrs. Scarbrough has resumed therapy after recovering from [facility].  Facility reports disposition plan remains to return to ALF.  Will continue to follow for progression and for potential [facility] Care Management needs while member resides at [facility] SNF.    Marthenia Rolling, MSN-Ed, RN,BSN [place] Acute Care Coordinator [phone] [facility]) [phone]  (Toll free office)

## 2019-09-24 DIAGNOSIS — S32492D Other specified fracture of left acetabulum, subsequent encounter for fracture with routine healing: Secondary | ICD-10-CM | POA: Diagnosis not present

## 2019-09-24 DIAGNOSIS — S32592D Other specified fracture of left pubis, subsequent encounter for fracture with routine healing: Secondary | ICD-10-CM | POA: Diagnosis not present

## 2019-09-24 DIAGNOSIS — B029 Zoster without complications: Secondary | ICD-10-CM | POA: Diagnosis not present

## 2019-09-24 DIAGNOSIS — U071 COVID-19: Secondary | ICD-10-CM | POA: Diagnosis not present

## 2019-09-24 DIAGNOSIS — F0390 Unspecified dementia without behavioral disturbance: Secondary | ICD-10-CM | POA: Diagnosis not present

## 2019-09-28 DIAGNOSIS — U071 COVID-19: Secondary | ICD-10-CM | POA: Diagnosis not present

## 2019-09-28 DIAGNOSIS — S32592D Other specified fracture of left pubis, subsequent encounter for fracture with routine healing: Secondary | ICD-10-CM | POA: Diagnosis not present

## 2019-09-28 DIAGNOSIS — I480 Paroxysmal atrial fibrillation: Secondary | ICD-10-CM | POA: Diagnosis not present

## 2019-09-28 DIAGNOSIS — B029 Zoster without complications: Secondary | ICD-10-CM | POA: Diagnosis not present

## 2019-10-01 DIAGNOSIS — F329 Major depressive disorder, single episode, unspecified: Secondary | ICD-10-CM | POA: Diagnosis not present

## 2019-10-01 DIAGNOSIS — I480 Paroxysmal atrial fibrillation: Secondary | ICD-10-CM | POA: Diagnosis not present

## 2019-10-01 DIAGNOSIS — F0390 Unspecified dementia without behavioral disturbance: Secondary | ICD-10-CM | POA: Diagnosis not present

## 2019-10-01 DIAGNOSIS — S32592D Other specified fracture of left pubis, subsequent encounter for fracture with routine healing: Secondary | ICD-10-CM | POA: Diagnosis not present

## 2019-10-01 DIAGNOSIS — B029 Zoster without complications: Secondary | ICD-10-CM | POA: Diagnosis not present

## 2019-10-01 DIAGNOSIS — S32492D Other specified fracture of left acetabulum, subsequent encounter for fracture with routine healing: Secondary | ICD-10-CM | POA: Diagnosis not present

## 2019-10-01 DIAGNOSIS — W19XXXD Unspecified fall, subsequent encounter: Secondary | ICD-10-CM | POA: Diagnosis not present

## 2019-10-01 DIAGNOSIS — U071 COVID-19: Secondary | ICD-10-CM | POA: Diagnosis not present

## 2019-10-03 ENCOUNTER — Other Ambulatory Visit: Payer: Self-pay | Admitting: *Deleted

## 2019-10-03 NOTE — Patient Outreach (Signed)
Member assessed for potential [facility] Care Management needs as a benefit of  [place] Medicare.  Member is currently receiving skilled therapy at [facility] SNF.  Update received from facility dc planner. Member to return to ALF at [facility] discharge.  Will continue to follow for potential [facility] Care Management needs while Mrs. Raj remains at [facility].   Marthenia Rolling, MSN-Ed, RN,BSN [place] Acute Care Coordinator [phone] [facility]) [phone]  (Toll free office)

## 2019-10-04 DIAGNOSIS — I480 Paroxysmal atrial fibrillation: Secondary | ICD-10-CM | POA: Diagnosis not present

## 2019-10-04 DIAGNOSIS — S32592D Other specified fracture of left pubis, subsequent encounter for fracture with routine healing: Secondary | ICD-10-CM | POA: Diagnosis not present

## 2019-10-04 DIAGNOSIS — I10 Essential (primary) hypertension: Secondary | ICD-10-CM | POA: Diagnosis not present

## 2019-10-04 DIAGNOSIS — D508 Other iron deficiency anemias: Secondary | ICD-10-CM | POA: Diagnosis not present

## 2019-10-05 ENCOUNTER — Other Ambulatory Visit: Payer: Self-pay | Admitting: *Deleted

## 2019-10-05 NOTE — Patient Outreach (Signed)
Member assessed for potential [facility] Care Management needs as a benefit of  [place] Medicare.  Member is currently receiving skilled therapy at [facility] SN[facility].  Member discussed in weekly telephonic IDT meeting with facility staff, [facility] UM team, and writer.  Facility reports family is trying to find another ALF. Member is mod to max assist with therapy. Unsure if ALF is appropriate level of care. May need LTC.   Will continue to follow for disposition plans, progression, and for potential [facility] Care Management needs.    Marthenia Rolling, MSN-Ed, RN,BSN [place] Acute Care Coordinator [phone] [facility]) [phone]  (Toll free office)

## 2019-10-11 DIAGNOSIS — Z20828 Contact with and (suspected) exposure to other viral communicable diseases: Secondary | ICD-10-CM | POA: Diagnosis not present

## 2019-10-12 ENCOUNTER — Other Ambulatory Visit: Payer: Self-pay | Admitting: *Deleted

## 2019-10-12 DIAGNOSIS — I10 Essential (primary) hypertension: Secondary | ICD-10-CM | POA: Diagnosis not present

## 2019-10-12 DIAGNOSIS — I480 Paroxysmal atrial fibrillation: Secondary | ICD-10-CM | POA: Diagnosis not present

## 2019-10-12 DIAGNOSIS — S32492D Other specified fracture of left acetabulum, subsequent encounter for fracture with routine healing: Secondary | ICD-10-CM | POA: Diagnosis not present

## 2019-10-12 DIAGNOSIS — S32592D Other specified fracture of left pubis, subsequent encounter for fracture with routine healing: Secondary | ICD-10-CM | POA: Diagnosis not present

## 2019-10-12 NOTE — Patient Outreach (Signed)
Member assessed for potential [facility] Care Management needs as a benefit of  [place] Medicare.  Member is currently receiving skilled therapy at [facility].  Member discussed in weekly telephonic interdisciplinary team meeting with facility staff, [facility] Utilization Management team, and writer.  Facility staff reports disposition plan is for [facility] likely next Monday.  No identifiable [facility] Care Management needs at this time.    Marthenia Rolling, MSN-Ed, RN,BSN [place] Acute Care Coordinator [phone] [facility]) [phone]  (Toll free office)

## 2019-10-14 DIAGNOSIS — R102 Pelvic and perineal pain: Secondary | ICD-10-CM | POA: Diagnosis not present

## 2019-10-21 DIAGNOSIS — I10 Essential (primary) hypertension: Secondary | ICD-10-CM | POA: Diagnosis not present

## 2019-10-21 DIAGNOSIS — W19XXXA Unspecified fall, initial encounter: Secondary | ICD-10-CM | POA: Diagnosis not present

## 2019-10-21 DIAGNOSIS — S0083XA Contusion of other part of head, initial encounter: Secondary | ICD-10-CM | POA: Diagnosis not present

## 2019-10-21 DIAGNOSIS — I480 Paroxysmal atrial fibrillation: Secondary | ICD-10-CM | POA: Diagnosis not present

## 2019-10-24 DIAGNOSIS — B351 Tinea unguium: Secondary | ICD-10-CM | POA: Diagnosis not present

## 2019-10-24 DIAGNOSIS — I739 Peripheral vascular disease, unspecified: Secondary | ICD-10-CM | POA: Diagnosis not present

## 2019-10-24 DIAGNOSIS — L603 Nail dystrophy: Secondary | ICD-10-CM | POA: Diagnosis not present

## 2019-10-24 DIAGNOSIS — Q845 Enlarged and hypertrophic nails: Secondary | ICD-10-CM | POA: Diagnosis not present

## 2019-11-02 DIAGNOSIS — I4891 Unspecified atrial fibrillation: Secondary | ICD-10-CM | POA: Diagnosis not present

## 2019-11-02 DIAGNOSIS — I10 Essential (primary) hypertension: Secondary | ICD-10-CM | POA: Diagnosis not present

## 2019-11-11 DIAGNOSIS — I4891 Unspecified atrial fibrillation: Secondary | ICD-10-CM | POA: Diagnosis not present

## 2019-11-11 DIAGNOSIS — F331 Major depressive disorder, recurrent, moderate: Secondary | ICD-10-CM | POA: Diagnosis not present

## 2019-11-11 DIAGNOSIS — S32501D Unspecified fracture of right pubis, subsequent encounter for fracture with routine healing: Secondary | ICD-10-CM | POA: Diagnosis not present

## 2019-11-11 DIAGNOSIS — I10 Essential (primary) hypertension: Secondary | ICD-10-CM | POA: Diagnosis not present

## 2019-11-11 DIAGNOSIS — F0390 Unspecified dementia without behavioral disturbance: Secondary | ICD-10-CM | POA: Diagnosis not present

## 2019-11-11 DIAGNOSIS — R2681 Unsteadiness on feet: Secondary | ICD-10-CM | POA: Diagnosis not present

## 2019-11-11 DIAGNOSIS — E785 Hyperlipidemia, unspecified: Secondary | ICD-10-CM | POA: Diagnosis not present

## 2019-11-11 DIAGNOSIS — H409 Unspecified glaucoma: Secondary | ICD-10-CM | POA: Diagnosis not present

## 2019-11-11 DIAGNOSIS — W19XXXD Unspecified fall, subsequent encounter: Secondary | ICD-10-CM | POA: Diagnosis not present

## 2019-11-14 DIAGNOSIS — S32501D Unspecified fracture of right pubis, subsequent encounter for fracture with routine healing: Secondary | ICD-10-CM | POA: Diagnosis not present

## 2019-11-14 DIAGNOSIS — H409 Unspecified glaucoma: Secondary | ICD-10-CM | POA: Diagnosis not present

## 2019-11-14 DIAGNOSIS — I10 Essential (primary) hypertension: Secondary | ICD-10-CM | POA: Diagnosis not present

## 2019-11-14 DIAGNOSIS — Z20828 Contact with and (suspected) exposure to other viral communicable diseases: Secondary | ICD-10-CM | POA: Diagnosis not present

## 2019-11-14 DIAGNOSIS — I4891 Unspecified atrial fibrillation: Secondary | ICD-10-CM | POA: Diagnosis not present

## 2019-11-14 DIAGNOSIS — R2681 Unsteadiness on feet: Secondary | ICD-10-CM | POA: Diagnosis not present

## 2019-11-14 DIAGNOSIS — F0390 Unspecified dementia without behavioral disturbance: Secondary | ICD-10-CM | POA: Diagnosis not present

## 2019-11-15 DIAGNOSIS — I4891 Unspecified atrial fibrillation: Secondary | ICD-10-CM | POA: Diagnosis not present

## 2019-11-15 DIAGNOSIS — R2681 Unsteadiness on feet: Secondary | ICD-10-CM | POA: Diagnosis not present

## 2019-11-15 DIAGNOSIS — H409 Unspecified glaucoma: Secondary | ICD-10-CM | POA: Diagnosis not present

## 2019-11-15 DIAGNOSIS — F0390 Unspecified dementia without behavioral disturbance: Secondary | ICD-10-CM | POA: Diagnosis not present

## 2019-11-15 DIAGNOSIS — S32501D Unspecified fracture of right pubis, subsequent encounter for fracture with routine healing: Secondary | ICD-10-CM | POA: Diagnosis not present

## 2019-11-15 DIAGNOSIS — I10 Essential (primary) hypertension: Secondary | ICD-10-CM | POA: Diagnosis not present

## 2019-11-16 DIAGNOSIS — F0390 Unspecified dementia without behavioral disturbance: Secondary | ICD-10-CM | POA: Diagnosis not present

## 2019-11-16 DIAGNOSIS — I4891 Unspecified atrial fibrillation: Secondary | ICD-10-CM | POA: Diagnosis not present

## 2019-11-16 DIAGNOSIS — Z23 Encounter for immunization: Secondary | ICD-10-CM | POA: Diagnosis not present

## 2019-11-16 DIAGNOSIS — I10 Essential (primary) hypertension: Secondary | ICD-10-CM | POA: Diagnosis not present

## 2019-11-16 DIAGNOSIS — S32501D Unspecified fracture of right pubis, subsequent encounter for fracture with routine healing: Secondary | ICD-10-CM | POA: Diagnosis not present

## 2019-11-16 DIAGNOSIS — H409 Unspecified glaucoma: Secondary | ICD-10-CM | POA: Diagnosis not present

## 2019-11-16 DIAGNOSIS — R2681 Unsteadiness on feet: Secondary | ICD-10-CM | POA: Diagnosis not present

## 2019-11-17 DIAGNOSIS — H409 Unspecified glaucoma: Secondary | ICD-10-CM | POA: Diagnosis not present

## 2019-11-17 DIAGNOSIS — R2681 Unsteadiness on feet: Secondary | ICD-10-CM | POA: Diagnosis not present

## 2019-11-17 DIAGNOSIS — S32501D Unspecified fracture of right pubis, subsequent encounter for fracture with routine healing: Secondary | ICD-10-CM | POA: Diagnosis not present

## 2019-11-17 DIAGNOSIS — I4891 Unspecified atrial fibrillation: Secondary | ICD-10-CM | POA: Diagnosis not present

## 2019-11-17 DIAGNOSIS — F0390 Unspecified dementia without behavioral disturbance: Secondary | ICD-10-CM | POA: Diagnosis not present

## 2019-11-17 DIAGNOSIS — I10 Essential (primary) hypertension: Secondary | ICD-10-CM | POA: Diagnosis not present

## 2019-11-18 DIAGNOSIS — R2681 Unsteadiness on feet: Secondary | ICD-10-CM | POA: Diagnosis not present

## 2019-11-18 DIAGNOSIS — S32501D Unspecified fracture of right pubis, subsequent encounter for fracture with routine healing: Secondary | ICD-10-CM | POA: Diagnosis not present

## 2019-11-18 DIAGNOSIS — H409 Unspecified glaucoma: Secondary | ICD-10-CM | POA: Diagnosis not present

## 2019-11-18 DIAGNOSIS — F0390 Unspecified dementia without behavioral disturbance: Secondary | ICD-10-CM | POA: Diagnosis not present

## 2019-11-18 DIAGNOSIS — I10 Essential (primary) hypertension: Secondary | ICD-10-CM | POA: Diagnosis not present

## 2019-11-18 DIAGNOSIS — I4891 Unspecified atrial fibrillation: Secondary | ICD-10-CM | POA: Diagnosis not present

## 2019-11-21 DIAGNOSIS — I4891 Unspecified atrial fibrillation: Secondary | ICD-10-CM | POA: Diagnosis not present

## 2019-11-21 DIAGNOSIS — F0390 Unspecified dementia without behavioral disturbance: Secondary | ICD-10-CM | POA: Diagnosis not present

## 2019-11-21 DIAGNOSIS — H409 Unspecified glaucoma: Secondary | ICD-10-CM | POA: Diagnosis not present

## 2019-11-21 DIAGNOSIS — I10 Essential (primary) hypertension: Secondary | ICD-10-CM | POA: Diagnosis not present

## 2019-11-21 DIAGNOSIS — R2681 Unsteadiness on feet: Secondary | ICD-10-CM | POA: Diagnosis not present

## 2019-11-21 DIAGNOSIS — Z20828 Contact with and (suspected) exposure to other viral communicable diseases: Secondary | ICD-10-CM | POA: Diagnosis not present

## 2019-11-21 DIAGNOSIS — S32501D Unspecified fracture of right pubis, subsequent encounter for fracture with routine healing: Secondary | ICD-10-CM | POA: Diagnosis not present

## 2019-11-22 DIAGNOSIS — S32501D Unspecified fracture of right pubis, subsequent encounter for fracture with routine healing: Secondary | ICD-10-CM | POA: Diagnosis not present

## 2019-11-22 DIAGNOSIS — F0390 Unspecified dementia without behavioral disturbance: Secondary | ICD-10-CM | POA: Diagnosis not present

## 2019-11-22 DIAGNOSIS — I10 Essential (primary) hypertension: Secondary | ICD-10-CM | POA: Diagnosis not present

## 2019-11-22 DIAGNOSIS — R2681 Unsteadiness on feet: Secondary | ICD-10-CM | POA: Diagnosis not present

## 2019-11-22 DIAGNOSIS — H409 Unspecified glaucoma: Secondary | ICD-10-CM | POA: Diagnosis not present

## 2019-11-22 DIAGNOSIS — I4891 Unspecified atrial fibrillation: Secondary | ICD-10-CM | POA: Diagnosis not present

## 2019-11-23 DIAGNOSIS — S32501D Unspecified fracture of right pubis, subsequent encounter for fracture with routine healing: Secondary | ICD-10-CM | POA: Diagnosis not present

## 2019-11-23 DIAGNOSIS — F0390 Unspecified dementia without behavioral disturbance: Secondary | ICD-10-CM | POA: Diagnosis not present

## 2019-11-23 DIAGNOSIS — I10 Essential (primary) hypertension: Secondary | ICD-10-CM | POA: Diagnosis not present

## 2019-11-23 DIAGNOSIS — H409 Unspecified glaucoma: Secondary | ICD-10-CM | POA: Diagnosis not present

## 2019-11-23 DIAGNOSIS — R2681 Unsteadiness on feet: Secondary | ICD-10-CM | POA: Diagnosis not present

## 2019-11-23 DIAGNOSIS — I4891 Unspecified atrial fibrillation: Secondary | ICD-10-CM | POA: Diagnosis not present

## 2019-11-24 DIAGNOSIS — H409 Unspecified glaucoma: Secondary | ICD-10-CM | POA: Diagnosis not present

## 2019-11-24 DIAGNOSIS — F0390 Unspecified dementia without behavioral disturbance: Secondary | ICD-10-CM | POA: Diagnosis not present

## 2019-11-24 DIAGNOSIS — R2681 Unsteadiness on feet: Secondary | ICD-10-CM | POA: Diagnosis not present

## 2019-11-24 DIAGNOSIS — I4891 Unspecified atrial fibrillation: Secondary | ICD-10-CM | POA: Diagnosis not present

## 2019-11-24 DIAGNOSIS — I10 Essential (primary) hypertension: Secondary | ICD-10-CM | POA: Diagnosis not present

## 2019-11-24 DIAGNOSIS — S32501D Unspecified fracture of right pubis, subsequent encounter for fracture with routine healing: Secondary | ICD-10-CM | POA: Diagnosis not present

## 2019-11-25 DIAGNOSIS — R2681 Unsteadiness on feet: Secondary | ICD-10-CM | POA: Diagnosis not present

## 2019-11-25 DIAGNOSIS — I4891 Unspecified atrial fibrillation: Secondary | ICD-10-CM | POA: Diagnosis not present

## 2019-11-25 DIAGNOSIS — F0390 Unspecified dementia without behavioral disturbance: Secondary | ICD-10-CM | POA: Diagnosis not present

## 2019-11-25 DIAGNOSIS — H409 Unspecified glaucoma: Secondary | ICD-10-CM | POA: Diagnosis not present

## 2019-11-25 DIAGNOSIS — I10 Essential (primary) hypertension: Secondary | ICD-10-CM | POA: Diagnosis not present

## 2019-11-25 DIAGNOSIS — S32501D Unspecified fracture of right pubis, subsequent encounter for fracture with routine healing: Secondary | ICD-10-CM | POA: Diagnosis not present

## 2019-11-27 DIAGNOSIS — Z20828 Contact with and (suspected) exposure to other viral communicable diseases: Secondary | ICD-10-CM | POA: Diagnosis not present

## 2019-11-28 DIAGNOSIS — H409 Unspecified glaucoma: Secondary | ICD-10-CM | POA: Diagnosis not present

## 2019-11-28 DIAGNOSIS — I4891 Unspecified atrial fibrillation: Secondary | ICD-10-CM | POA: Diagnosis not present

## 2019-11-28 DIAGNOSIS — S32501D Unspecified fracture of right pubis, subsequent encounter for fracture with routine healing: Secondary | ICD-10-CM | POA: Diagnosis not present

## 2019-11-28 DIAGNOSIS — R2681 Unsteadiness on feet: Secondary | ICD-10-CM | POA: Diagnosis not present

## 2019-11-28 DIAGNOSIS — F0390 Unspecified dementia without behavioral disturbance: Secondary | ICD-10-CM | POA: Diagnosis not present

## 2019-11-28 DIAGNOSIS — I10 Essential (primary) hypertension: Secondary | ICD-10-CM | POA: Diagnosis not present

## 2019-11-29 DIAGNOSIS — I4891 Unspecified atrial fibrillation: Secondary | ICD-10-CM | POA: Diagnosis not present

## 2019-11-29 DIAGNOSIS — F0390 Unspecified dementia without behavioral disturbance: Secondary | ICD-10-CM | POA: Diagnosis not present

## 2019-11-29 DIAGNOSIS — S32501D Unspecified fracture of right pubis, subsequent encounter for fracture with routine healing: Secondary | ICD-10-CM | POA: Diagnosis not present

## 2019-11-29 DIAGNOSIS — I10 Essential (primary) hypertension: Secondary | ICD-10-CM | POA: Diagnosis not present

## 2019-11-29 DIAGNOSIS — R2681 Unsteadiness on feet: Secondary | ICD-10-CM | POA: Diagnosis not present

## 2019-11-29 DIAGNOSIS — H409 Unspecified glaucoma: Secondary | ICD-10-CM | POA: Diagnosis not present

## 2019-11-30 DIAGNOSIS — H409 Unspecified glaucoma: Secondary | ICD-10-CM | POA: Diagnosis not present

## 2019-11-30 DIAGNOSIS — F0390 Unspecified dementia without behavioral disturbance: Secondary | ICD-10-CM | POA: Diagnosis not present

## 2019-11-30 DIAGNOSIS — I4891 Unspecified atrial fibrillation: Secondary | ICD-10-CM | POA: Diagnosis not present

## 2019-11-30 DIAGNOSIS — S32501D Unspecified fracture of right pubis, subsequent encounter for fracture with routine healing: Secondary | ICD-10-CM | POA: Diagnosis not present

## 2019-11-30 DIAGNOSIS — I10 Essential (primary) hypertension: Secondary | ICD-10-CM | POA: Diagnosis not present

## 2019-11-30 DIAGNOSIS — R2681 Unsteadiness on feet: Secondary | ICD-10-CM | POA: Diagnosis not present

## 2019-12-01 DIAGNOSIS — I4891 Unspecified atrial fibrillation: Secondary | ICD-10-CM | POA: Diagnosis not present

## 2019-12-01 DIAGNOSIS — S32501D Unspecified fracture of right pubis, subsequent encounter for fracture with routine healing: Secondary | ICD-10-CM | POA: Diagnosis not present

## 2019-12-01 DIAGNOSIS — F0390 Unspecified dementia without behavioral disturbance: Secondary | ICD-10-CM | POA: Diagnosis not present

## 2019-12-01 DIAGNOSIS — R2681 Unsteadiness on feet: Secondary | ICD-10-CM | POA: Diagnosis not present

## 2019-12-01 DIAGNOSIS — H409 Unspecified glaucoma: Secondary | ICD-10-CM | POA: Diagnosis not present

## 2019-12-01 DIAGNOSIS — I10 Essential (primary) hypertension: Secondary | ICD-10-CM | POA: Diagnosis not present

## 2019-12-02 DIAGNOSIS — I4891 Unspecified atrial fibrillation: Secondary | ICD-10-CM | POA: Diagnosis not present

## 2019-12-02 DIAGNOSIS — S32501D Unspecified fracture of right pubis, subsequent encounter for fracture with routine healing: Secondary | ICD-10-CM | POA: Diagnosis not present

## 2019-12-02 DIAGNOSIS — H409 Unspecified glaucoma: Secondary | ICD-10-CM | POA: Diagnosis not present

## 2019-12-02 DIAGNOSIS — R2681 Unsteadiness on feet: Secondary | ICD-10-CM | POA: Diagnosis not present

## 2019-12-02 DIAGNOSIS — I10 Essential (primary) hypertension: Secondary | ICD-10-CM | POA: Diagnosis not present

## 2019-12-02 DIAGNOSIS — F0390 Unspecified dementia without behavioral disturbance: Secondary | ICD-10-CM | POA: Diagnosis not present

## 2019-12-05 DIAGNOSIS — I10 Essential (primary) hypertension: Secondary | ICD-10-CM | POA: Diagnosis not present

## 2019-12-05 DIAGNOSIS — Z20828 Contact with and (suspected) exposure to other viral communicable diseases: Secondary | ICD-10-CM | POA: Diagnosis not present

## 2019-12-05 DIAGNOSIS — H409 Unspecified glaucoma: Secondary | ICD-10-CM | POA: Diagnosis not present

## 2019-12-05 DIAGNOSIS — S32501D Unspecified fracture of right pubis, subsequent encounter for fracture with routine healing: Secondary | ICD-10-CM | POA: Diagnosis not present

## 2019-12-05 DIAGNOSIS — F0390 Unspecified dementia without behavioral disturbance: Secondary | ICD-10-CM | POA: Diagnosis not present

## 2019-12-05 DIAGNOSIS — I4891 Unspecified atrial fibrillation: Secondary | ICD-10-CM | POA: Diagnosis not present

## 2019-12-05 DIAGNOSIS — R2681 Unsteadiness on feet: Secondary | ICD-10-CM | POA: Diagnosis not present

## 2019-12-06 DIAGNOSIS — H409 Unspecified glaucoma: Secondary | ICD-10-CM | POA: Diagnosis not present

## 2019-12-06 DIAGNOSIS — I4891 Unspecified atrial fibrillation: Secondary | ICD-10-CM | POA: Diagnosis not present

## 2019-12-06 DIAGNOSIS — I10 Essential (primary) hypertension: Secondary | ICD-10-CM | POA: Diagnosis not present

## 2019-12-06 DIAGNOSIS — S32501D Unspecified fracture of right pubis, subsequent encounter for fracture with routine healing: Secondary | ICD-10-CM | POA: Diagnosis not present

## 2019-12-06 DIAGNOSIS — R2681 Unsteadiness on feet: Secondary | ICD-10-CM | POA: Diagnosis not present

## 2019-12-06 DIAGNOSIS — F0390 Unspecified dementia without behavioral disturbance: Secondary | ICD-10-CM | POA: Diagnosis not present

## 2019-12-07 DIAGNOSIS — F0390 Unspecified dementia without behavioral disturbance: Secondary | ICD-10-CM | POA: Diagnosis not present

## 2019-12-07 DIAGNOSIS — S32501D Unspecified fracture of right pubis, subsequent encounter for fracture with routine healing: Secondary | ICD-10-CM | POA: Diagnosis not present

## 2019-12-07 DIAGNOSIS — H409 Unspecified glaucoma: Secondary | ICD-10-CM | POA: Diagnosis not present

## 2019-12-07 DIAGNOSIS — I10 Essential (primary) hypertension: Secondary | ICD-10-CM | POA: Diagnosis not present

## 2019-12-07 DIAGNOSIS — R2681 Unsteadiness on feet: Secondary | ICD-10-CM | POA: Diagnosis not present

## 2019-12-07 DIAGNOSIS — I4891 Unspecified atrial fibrillation: Secondary | ICD-10-CM | POA: Diagnosis not present

## 2019-12-08 DIAGNOSIS — I10 Essential (primary) hypertension: Secondary | ICD-10-CM | POA: Diagnosis not present

## 2019-12-08 DIAGNOSIS — F0390 Unspecified dementia without behavioral disturbance: Secondary | ICD-10-CM | POA: Diagnosis not present

## 2019-12-08 DIAGNOSIS — S32501D Unspecified fracture of right pubis, subsequent encounter for fracture with routine healing: Secondary | ICD-10-CM | POA: Diagnosis not present

## 2019-12-08 DIAGNOSIS — I4891 Unspecified atrial fibrillation: Secondary | ICD-10-CM | POA: Diagnosis not present

## 2019-12-08 DIAGNOSIS — H409 Unspecified glaucoma: Secondary | ICD-10-CM | POA: Diagnosis not present

## 2019-12-08 DIAGNOSIS — R2681 Unsteadiness on feet: Secondary | ICD-10-CM | POA: Diagnosis not present

## 2019-12-09 DIAGNOSIS — S32501D Unspecified fracture of right pubis, subsequent encounter for fracture with routine healing: Secondary | ICD-10-CM | POA: Diagnosis not present

## 2019-12-09 DIAGNOSIS — H409 Unspecified glaucoma: Secondary | ICD-10-CM | POA: Diagnosis not present

## 2019-12-09 DIAGNOSIS — I4891 Unspecified atrial fibrillation: Secondary | ICD-10-CM | POA: Diagnosis not present

## 2019-12-09 DIAGNOSIS — F0390 Unspecified dementia without behavioral disturbance: Secondary | ICD-10-CM | POA: Diagnosis not present

## 2019-12-09 DIAGNOSIS — I10 Essential (primary) hypertension: Secondary | ICD-10-CM | POA: Diagnosis not present

## 2019-12-09 DIAGNOSIS — R2681 Unsteadiness on feet: Secondary | ICD-10-CM | POA: Diagnosis not present

## 2019-12-12 DIAGNOSIS — H401132 Primary open-angle glaucoma, bilateral, moderate stage: Secondary | ICD-10-CM | POA: Diagnosis not present

## 2019-12-12 DIAGNOSIS — F0390 Unspecified dementia without behavioral disturbance: Secondary | ICD-10-CM | POA: Diagnosis not present

## 2019-12-12 DIAGNOSIS — H409 Unspecified glaucoma: Secondary | ICD-10-CM | POA: Diagnosis not present

## 2019-12-12 DIAGNOSIS — F331 Major depressive disorder, recurrent, moderate: Secondary | ICD-10-CM | POA: Diagnosis not present

## 2019-12-12 DIAGNOSIS — I4891 Unspecified atrial fibrillation: Secondary | ICD-10-CM | POA: Diagnosis not present

## 2019-12-12 DIAGNOSIS — Z20828 Contact with and (suspected) exposure to other viral communicable diseases: Secondary | ICD-10-CM | POA: Diagnosis not present

## 2019-12-12 DIAGNOSIS — I10 Essential (primary) hypertension: Secondary | ICD-10-CM | POA: Diagnosis not present

## 2019-12-12 DIAGNOSIS — R2681 Unsteadiness on feet: Secondary | ICD-10-CM | POA: Diagnosis not present

## 2019-12-12 DIAGNOSIS — W19XXXD Unspecified fall, subsequent encounter: Secondary | ICD-10-CM | POA: Diagnosis not present

## 2019-12-12 DIAGNOSIS — E785 Hyperlipidemia, unspecified: Secondary | ICD-10-CM | POA: Diagnosis not present

## 2019-12-12 DIAGNOSIS — S32501D Unspecified fracture of right pubis, subsequent encounter for fracture with routine healing: Secondary | ICD-10-CM | POA: Diagnosis not present

## 2019-12-13 DIAGNOSIS — I4891 Unspecified atrial fibrillation: Secondary | ICD-10-CM | POA: Diagnosis not present

## 2019-12-13 DIAGNOSIS — H409 Unspecified glaucoma: Secondary | ICD-10-CM | POA: Diagnosis not present

## 2019-12-13 DIAGNOSIS — I10 Essential (primary) hypertension: Secondary | ICD-10-CM | POA: Diagnosis not present

## 2019-12-13 DIAGNOSIS — F0390 Unspecified dementia without behavioral disturbance: Secondary | ICD-10-CM | POA: Diagnosis not present

## 2019-12-13 DIAGNOSIS — S32501D Unspecified fracture of right pubis, subsequent encounter for fracture with routine healing: Secondary | ICD-10-CM | POA: Diagnosis not present

## 2019-12-13 DIAGNOSIS — R2681 Unsteadiness on feet: Secondary | ICD-10-CM | POA: Diagnosis not present

## 2019-12-14 DIAGNOSIS — S32501D Unspecified fracture of right pubis, subsequent encounter for fracture with routine healing: Secondary | ICD-10-CM | POA: Diagnosis not present

## 2019-12-14 DIAGNOSIS — R2681 Unsteadiness on feet: Secondary | ICD-10-CM | POA: Diagnosis not present

## 2019-12-14 DIAGNOSIS — I10 Essential (primary) hypertension: Secondary | ICD-10-CM | POA: Diagnosis not present

## 2019-12-14 DIAGNOSIS — I4891 Unspecified atrial fibrillation: Secondary | ICD-10-CM | POA: Diagnosis not present

## 2019-12-14 DIAGNOSIS — Z23 Encounter for immunization: Secondary | ICD-10-CM | POA: Diagnosis not present

## 2019-12-14 DIAGNOSIS — H409 Unspecified glaucoma: Secondary | ICD-10-CM | POA: Diagnosis not present

## 2019-12-14 DIAGNOSIS — F0390 Unspecified dementia without behavioral disturbance: Secondary | ICD-10-CM | POA: Diagnosis not present

## 2019-12-15 DIAGNOSIS — S32501D Unspecified fracture of right pubis, subsequent encounter for fracture with routine healing: Secondary | ICD-10-CM | POA: Diagnosis not present

## 2019-12-15 DIAGNOSIS — F0390 Unspecified dementia without behavioral disturbance: Secondary | ICD-10-CM | POA: Diagnosis not present

## 2019-12-15 DIAGNOSIS — I4891 Unspecified atrial fibrillation: Secondary | ICD-10-CM | POA: Diagnosis not present

## 2019-12-15 DIAGNOSIS — R2681 Unsteadiness on feet: Secondary | ICD-10-CM | POA: Diagnosis not present

## 2019-12-15 DIAGNOSIS — I10 Essential (primary) hypertension: Secondary | ICD-10-CM | POA: Diagnosis not present

## 2019-12-15 DIAGNOSIS — H409 Unspecified glaucoma: Secondary | ICD-10-CM | POA: Diagnosis not present

## 2019-12-16 DIAGNOSIS — F0390 Unspecified dementia without behavioral disturbance: Secondary | ICD-10-CM | POA: Diagnosis not present

## 2019-12-16 DIAGNOSIS — S32501D Unspecified fracture of right pubis, subsequent encounter for fracture with routine healing: Secondary | ICD-10-CM | POA: Diagnosis not present

## 2019-12-16 DIAGNOSIS — H409 Unspecified glaucoma: Secondary | ICD-10-CM | POA: Diagnosis not present

## 2019-12-16 DIAGNOSIS — I10 Essential (primary) hypertension: Secondary | ICD-10-CM | POA: Diagnosis not present

## 2019-12-16 DIAGNOSIS — I4891 Unspecified atrial fibrillation: Secondary | ICD-10-CM | POA: Diagnosis not present

## 2019-12-16 DIAGNOSIS — R2681 Unsteadiness on feet: Secondary | ICD-10-CM | POA: Diagnosis not present

## 2019-12-19 DIAGNOSIS — I10 Essential (primary) hypertension: Secondary | ICD-10-CM | POA: Diagnosis not present

## 2019-12-19 DIAGNOSIS — I4891 Unspecified atrial fibrillation: Secondary | ICD-10-CM | POA: Diagnosis not present

## 2019-12-19 DIAGNOSIS — Z20828 Contact with and (suspected) exposure to other viral communicable diseases: Secondary | ICD-10-CM | POA: Diagnosis not present

## 2019-12-19 DIAGNOSIS — F0390 Unspecified dementia without behavioral disturbance: Secondary | ICD-10-CM | POA: Diagnosis not present

## 2019-12-19 DIAGNOSIS — R2681 Unsteadiness on feet: Secondary | ICD-10-CM | POA: Diagnosis not present

## 2019-12-19 DIAGNOSIS — H409 Unspecified glaucoma: Secondary | ICD-10-CM | POA: Diagnosis not present

## 2019-12-19 DIAGNOSIS — S32501D Unspecified fracture of right pubis, subsequent encounter for fracture with routine healing: Secondary | ICD-10-CM | POA: Diagnosis not present

## 2019-12-20 DIAGNOSIS — I4891 Unspecified atrial fibrillation: Secondary | ICD-10-CM | POA: Diagnosis not present

## 2019-12-20 DIAGNOSIS — R2681 Unsteadiness on feet: Secondary | ICD-10-CM | POA: Diagnosis not present

## 2019-12-20 DIAGNOSIS — I10 Essential (primary) hypertension: Secondary | ICD-10-CM | POA: Diagnosis not present

## 2019-12-20 DIAGNOSIS — S32501D Unspecified fracture of right pubis, subsequent encounter for fracture with routine healing: Secondary | ICD-10-CM | POA: Diagnosis not present

## 2019-12-20 DIAGNOSIS — F0390 Unspecified dementia without behavioral disturbance: Secondary | ICD-10-CM | POA: Diagnosis not present

## 2019-12-20 DIAGNOSIS — H409 Unspecified glaucoma: Secondary | ICD-10-CM | POA: Diagnosis not present

## 2019-12-21 DIAGNOSIS — I10 Essential (primary) hypertension: Secondary | ICD-10-CM | POA: Diagnosis not present

## 2019-12-21 DIAGNOSIS — R2681 Unsteadiness on feet: Secondary | ICD-10-CM | POA: Diagnosis not present

## 2019-12-21 DIAGNOSIS — I4891 Unspecified atrial fibrillation: Secondary | ICD-10-CM | POA: Diagnosis not present

## 2019-12-21 DIAGNOSIS — H409 Unspecified glaucoma: Secondary | ICD-10-CM | POA: Diagnosis not present

## 2019-12-21 DIAGNOSIS — F0390 Unspecified dementia without behavioral disturbance: Secondary | ICD-10-CM | POA: Diagnosis not present

## 2019-12-21 DIAGNOSIS — S32501D Unspecified fracture of right pubis, subsequent encounter for fracture with routine healing: Secondary | ICD-10-CM | POA: Diagnosis not present

## 2019-12-22 DIAGNOSIS — F0390 Unspecified dementia without behavioral disturbance: Secondary | ICD-10-CM | POA: Diagnosis not present

## 2019-12-22 DIAGNOSIS — R2681 Unsteadiness on feet: Secondary | ICD-10-CM | POA: Diagnosis not present

## 2019-12-22 DIAGNOSIS — I4891 Unspecified atrial fibrillation: Secondary | ICD-10-CM | POA: Diagnosis not present

## 2019-12-22 DIAGNOSIS — S32501D Unspecified fracture of right pubis, subsequent encounter for fracture with routine healing: Secondary | ICD-10-CM | POA: Diagnosis not present

## 2019-12-22 DIAGNOSIS — I10 Essential (primary) hypertension: Secondary | ICD-10-CM | POA: Diagnosis not present

## 2019-12-22 DIAGNOSIS — H409 Unspecified glaucoma: Secondary | ICD-10-CM | POA: Diagnosis not present

## 2019-12-23 DIAGNOSIS — I10 Essential (primary) hypertension: Secondary | ICD-10-CM | POA: Diagnosis not present

## 2019-12-23 DIAGNOSIS — R2681 Unsteadiness on feet: Secondary | ICD-10-CM | POA: Diagnosis not present

## 2019-12-23 DIAGNOSIS — S32501D Unspecified fracture of right pubis, subsequent encounter for fracture with routine healing: Secondary | ICD-10-CM | POA: Diagnosis not present

## 2019-12-23 DIAGNOSIS — H409 Unspecified glaucoma: Secondary | ICD-10-CM | POA: Diagnosis not present

## 2019-12-23 DIAGNOSIS — I4891 Unspecified atrial fibrillation: Secondary | ICD-10-CM | POA: Diagnosis not present

## 2019-12-23 DIAGNOSIS — F0390 Unspecified dementia without behavioral disturbance: Secondary | ICD-10-CM | POA: Diagnosis not present

## 2019-12-26 DIAGNOSIS — S32501D Unspecified fracture of right pubis, subsequent encounter for fracture with routine healing: Secondary | ICD-10-CM | POA: Diagnosis not present

## 2019-12-26 DIAGNOSIS — R2681 Unsteadiness on feet: Secondary | ICD-10-CM | POA: Diagnosis not present

## 2019-12-26 DIAGNOSIS — I4891 Unspecified atrial fibrillation: Secondary | ICD-10-CM | POA: Diagnosis not present

## 2019-12-26 DIAGNOSIS — H409 Unspecified glaucoma: Secondary | ICD-10-CM | POA: Diagnosis not present

## 2019-12-26 DIAGNOSIS — I480 Paroxysmal atrial fibrillation: Secondary | ICD-10-CM | POA: Diagnosis not present

## 2019-12-26 DIAGNOSIS — F0390 Unspecified dementia without behavioral disturbance: Secondary | ICD-10-CM | POA: Diagnosis not present

## 2019-12-26 DIAGNOSIS — I10 Essential (primary) hypertension: Secondary | ICD-10-CM | POA: Diagnosis not present

## 2019-12-26 DIAGNOSIS — K219 Gastro-esophageal reflux disease without esophagitis: Secondary | ICD-10-CM | POA: Diagnosis not present

## 2019-12-26 DIAGNOSIS — D508 Other iron deficiency anemias: Secondary | ICD-10-CM | POA: Diagnosis not present

## 2019-12-27 DIAGNOSIS — S32501D Unspecified fracture of right pubis, subsequent encounter for fracture with routine healing: Secondary | ICD-10-CM | POA: Diagnosis not present

## 2019-12-27 DIAGNOSIS — F0390 Unspecified dementia without behavioral disturbance: Secondary | ICD-10-CM | POA: Diagnosis not present

## 2019-12-27 DIAGNOSIS — I4891 Unspecified atrial fibrillation: Secondary | ICD-10-CM | POA: Diagnosis not present

## 2019-12-27 DIAGNOSIS — H409 Unspecified glaucoma: Secondary | ICD-10-CM | POA: Diagnosis not present

## 2019-12-27 DIAGNOSIS — I10 Essential (primary) hypertension: Secondary | ICD-10-CM | POA: Diagnosis not present

## 2019-12-27 DIAGNOSIS — R2681 Unsteadiness on feet: Secondary | ICD-10-CM | POA: Diagnosis not present

## 2019-12-28 DIAGNOSIS — M810 Age-related osteoporosis without current pathological fracture: Secondary | ICD-10-CM | POA: Diagnosis not present

## 2019-12-28 DIAGNOSIS — R2681 Unsteadiness on feet: Secondary | ICD-10-CM | POA: Diagnosis not present

## 2019-12-28 DIAGNOSIS — I4891 Unspecified atrial fibrillation: Secondary | ICD-10-CM | POA: Diagnosis not present

## 2019-12-28 DIAGNOSIS — S32501D Unspecified fracture of right pubis, subsequent encounter for fracture with routine healing: Secondary | ICD-10-CM | POA: Diagnosis not present

## 2019-12-28 DIAGNOSIS — I10 Essential (primary) hypertension: Secondary | ICD-10-CM | POA: Diagnosis not present

## 2019-12-28 DIAGNOSIS — H409 Unspecified glaucoma: Secondary | ICD-10-CM | POA: Diagnosis not present

## 2019-12-28 DIAGNOSIS — F0390 Unspecified dementia without behavioral disturbance: Secondary | ICD-10-CM | POA: Diagnosis not present

## 2019-12-28 DIAGNOSIS — S329XXA Fracture of unspecified parts of lumbosacral spine and pelvis, initial encounter for closed fracture: Secondary | ICD-10-CM | POA: Diagnosis not present

## 2019-12-29 DIAGNOSIS — S32501D Unspecified fracture of right pubis, subsequent encounter for fracture with routine healing: Secondary | ICD-10-CM | POA: Diagnosis not present

## 2019-12-29 DIAGNOSIS — H409 Unspecified glaucoma: Secondary | ICD-10-CM | POA: Diagnosis not present

## 2019-12-29 DIAGNOSIS — I4891 Unspecified atrial fibrillation: Secondary | ICD-10-CM | POA: Diagnosis not present

## 2019-12-29 DIAGNOSIS — I10 Essential (primary) hypertension: Secondary | ICD-10-CM | POA: Diagnosis not present

## 2019-12-29 DIAGNOSIS — R2681 Unsteadiness on feet: Secondary | ICD-10-CM | POA: Diagnosis not present

## 2019-12-29 DIAGNOSIS — F0390 Unspecified dementia without behavioral disturbance: Secondary | ICD-10-CM | POA: Diagnosis not present

## 2019-12-30 DIAGNOSIS — S32501D Unspecified fracture of right pubis, subsequent encounter for fracture with routine healing: Secondary | ICD-10-CM | POA: Diagnosis not present

## 2019-12-30 DIAGNOSIS — H409 Unspecified glaucoma: Secondary | ICD-10-CM | POA: Diagnosis not present

## 2019-12-30 DIAGNOSIS — I4891 Unspecified atrial fibrillation: Secondary | ICD-10-CM | POA: Diagnosis not present

## 2019-12-30 DIAGNOSIS — R2681 Unsteadiness on feet: Secondary | ICD-10-CM | POA: Diagnosis not present

## 2019-12-30 DIAGNOSIS — I10 Essential (primary) hypertension: Secondary | ICD-10-CM | POA: Diagnosis not present

## 2019-12-30 DIAGNOSIS — F0390 Unspecified dementia without behavioral disturbance: Secondary | ICD-10-CM | POA: Diagnosis not present

## 2020-01-02 DIAGNOSIS — F0390 Unspecified dementia without behavioral disturbance: Secondary | ICD-10-CM | POA: Diagnosis not present

## 2020-01-02 DIAGNOSIS — I10 Essential (primary) hypertension: Secondary | ICD-10-CM | POA: Diagnosis not present

## 2020-01-02 DIAGNOSIS — S32501D Unspecified fracture of right pubis, subsequent encounter for fracture with routine healing: Secondary | ICD-10-CM | POA: Diagnosis not present

## 2020-01-02 DIAGNOSIS — H409 Unspecified glaucoma: Secondary | ICD-10-CM | POA: Diagnosis not present

## 2020-01-02 DIAGNOSIS — R2681 Unsteadiness on feet: Secondary | ICD-10-CM | POA: Diagnosis not present

## 2020-01-02 DIAGNOSIS — I4891 Unspecified atrial fibrillation: Secondary | ICD-10-CM | POA: Diagnosis not present

## 2020-01-03 DIAGNOSIS — S32501D Unspecified fracture of right pubis, subsequent encounter for fracture with routine healing: Secondary | ICD-10-CM | POA: Diagnosis not present

## 2020-01-03 DIAGNOSIS — I4891 Unspecified atrial fibrillation: Secondary | ICD-10-CM | POA: Diagnosis not present

## 2020-01-03 DIAGNOSIS — I10 Essential (primary) hypertension: Secondary | ICD-10-CM | POA: Diagnosis not present

## 2020-01-03 DIAGNOSIS — R2681 Unsteadiness on feet: Secondary | ICD-10-CM | POA: Diagnosis not present

## 2020-01-03 DIAGNOSIS — S32402D Unspecified fracture of left acetabulum, subsequent encounter for fracture with routine healing: Secondary | ICD-10-CM | POA: Diagnosis not present

## 2020-01-03 DIAGNOSIS — H409 Unspecified glaucoma: Secondary | ICD-10-CM | POA: Diagnosis not present

## 2020-01-03 DIAGNOSIS — F0390 Unspecified dementia without behavioral disturbance: Secondary | ICD-10-CM | POA: Diagnosis not present

## 2020-01-05 DIAGNOSIS — Z20828 Contact with and (suspected) exposure to other viral communicable diseases: Secondary | ICD-10-CM | POA: Diagnosis not present

## 2020-01-09 DIAGNOSIS — Z20828 Contact with and (suspected) exposure to other viral communicable diseases: Secondary | ICD-10-CM | POA: Diagnosis not present

## 2020-01-16 DIAGNOSIS — Z20828 Contact with and (suspected) exposure to other viral communicable diseases: Secondary | ICD-10-CM | POA: Diagnosis not present

## 2020-01-17 DIAGNOSIS — M6281 Muscle weakness (generalized): Secondary | ICD-10-CM | POA: Diagnosis not present

## 2020-01-17 DIAGNOSIS — F331 Major depressive disorder, recurrent, moderate: Secondary | ICD-10-CM | POA: Diagnosis not present

## 2020-01-17 DIAGNOSIS — E785 Hyperlipidemia, unspecified: Secondary | ICD-10-CM | POA: Diagnosis not present

## 2020-01-17 DIAGNOSIS — W19XXXD Unspecified fall, subsequent encounter: Secondary | ICD-10-CM | POA: Diagnosis not present

## 2020-01-17 DIAGNOSIS — I4891 Unspecified atrial fibrillation: Secondary | ICD-10-CM | POA: Diagnosis not present

## 2020-01-17 DIAGNOSIS — I10 Essential (primary) hypertension: Secondary | ICD-10-CM | POA: Diagnosis not present

## 2020-01-17 DIAGNOSIS — H409 Unspecified glaucoma: Secondary | ICD-10-CM | POA: Diagnosis not present

## 2020-01-17 DIAGNOSIS — F0390 Unspecified dementia without behavioral disturbance: Secondary | ICD-10-CM | POA: Diagnosis not present

## 2020-01-17 DIAGNOSIS — S32501D Unspecified fracture of right pubis, subsequent encounter for fracture with routine healing: Secondary | ICD-10-CM | POA: Diagnosis not present

## 2020-01-18 DIAGNOSIS — I4891 Unspecified atrial fibrillation: Secondary | ICD-10-CM | POA: Diagnosis not present

## 2020-01-18 DIAGNOSIS — I10 Essential (primary) hypertension: Secondary | ICD-10-CM | POA: Diagnosis not present

## 2020-01-18 DIAGNOSIS — M6281 Muscle weakness (generalized): Secondary | ICD-10-CM | POA: Diagnosis not present

## 2020-01-18 DIAGNOSIS — H409 Unspecified glaucoma: Secondary | ICD-10-CM | POA: Diagnosis not present

## 2020-01-18 DIAGNOSIS — F0390 Unspecified dementia without behavioral disturbance: Secondary | ICD-10-CM | POA: Diagnosis not present

## 2020-01-18 DIAGNOSIS — S32501D Unspecified fracture of right pubis, subsequent encounter for fracture with routine healing: Secondary | ICD-10-CM | POA: Diagnosis not present

## 2020-01-19 DIAGNOSIS — H409 Unspecified glaucoma: Secondary | ICD-10-CM | POA: Diagnosis not present

## 2020-01-19 DIAGNOSIS — I4891 Unspecified atrial fibrillation: Secondary | ICD-10-CM | POA: Diagnosis not present

## 2020-01-19 DIAGNOSIS — S32501D Unspecified fracture of right pubis, subsequent encounter for fracture with routine healing: Secondary | ICD-10-CM | POA: Diagnosis not present

## 2020-01-19 DIAGNOSIS — I10 Essential (primary) hypertension: Secondary | ICD-10-CM | POA: Diagnosis not present

## 2020-01-19 DIAGNOSIS — M6281 Muscle weakness (generalized): Secondary | ICD-10-CM | POA: Diagnosis not present

## 2020-01-19 DIAGNOSIS — F0390 Unspecified dementia without behavioral disturbance: Secondary | ICD-10-CM | POA: Diagnosis not present

## 2020-01-20 DIAGNOSIS — M6281 Muscle weakness (generalized): Secondary | ICD-10-CM | POA: Diagnosis not present

## 2020-01-20 DIAGNOSIS — H409 Unspecified glaucoma: Secondary | ICD-10-CM | POA: Diagnosis not present

## 2020-01-20 DIAGNOSIS — I4891 Unspecified atrial fibrillation: Secondary | ICD-10-CM | POA: Diagnosis not present

## 2020-01-20 DIAGNOSIS — S32501D Unspecified fracture of right pubis, subsequent encounter for fracture with routine healing: Secondary | ICD-10-CM | POA: Diagnosis not present

## 2020-01-20 DIAGNOSIS — I10 Essential (primary) hypertension: Secondary | ICD-10-CM | POA: Diagnosis not present

## 2020-01-20 DIAGNOSIS — F0390 Unspecified dementia without behavioral disturbance: Secondary | ICD-10-CM | POA: Diagnosis not present

## 2020-01-23 DIAGNOSIS — F0390 Unspecified dementia without behavioral disturbance: Secondary | ICD-10-CM | POA: Diagnosis not present

## 2020-01-23 DIAGNOSIS — H409 Unspecified glaucoma: Secondary | ICD-10-CM | POA: Diagnosis not present

## 2020-01-23 DIAGNOSIS — I10 Essential (primary) hypertension: Secondary | ICD-10-CM | POA: Diagnosis not present

## 2020-01-23 DIAGNOSIS — M6281 Muscle weakness (generalized): Secondary | ICD-10-CM | POA: Diagnosis not present

## 2020-01-23 DIAGNOSIS — I4891 Unspecified atrial fibrillation: Secondary | ICD-10-CM | POA: Diagnosis not present

## 2020-01-23 DIAGNOSIS — S32501D Unspecified fracture of right pubis, subsequent encounter for fracture with routine healing: Secondary | ICD-10-CM | POA: Diagnosis not present

## 2020-01-24 DIAGNOSIS — I4891 Unspecified atrial fibrillation: Secondary | ICD-10-CM | POA: Diagnosis not present

## 2020-01-24 DIAGNOSIS — M6281 Muscle weakness (generalized): Secondary | ICD-10-CM | POA: Diagnosis not present

## 2020-01-24 DIAGNOSIS — S32501D Unspecified fracture of right pubis, subsequent encounter for fracture with routine healing: Secondary | ICD-10-CM | POA: Diagnosis not present

## 2020-01-24 DIAGNOSIS — H409 Unspecified glaucoma: Secondary | ICD-10-CM | POA: Diagnosis not present

## 2020-01-24 DIAGNOSIS — I10 Essential (primary) hypertension: Secondary | ICD-10-CM | POA: Diagnosis not present

## 2020-01-24 DIAGNOSIS — F0390 Unspecified dementia without behavioral disturbance: Secondary | ICD-10-CM | POA: Diagnosis not present

## 2020-01-25 DIAGNOSIS — I4891 Unspecified atrial fibrillation: Secondary | ICD-10-CM | POA: Diagnosis not present

## 2020-01-25 DIAGNOSIS — F0390 Unspecified dementia without behavioral disturbance: Secondary | ICD-10-CM | POA: Diagnosis not present

## 2020-01-25 DIAGNOSIS — M6281 Muscle weakness (generalized): Secondary | ICD-10-CM | POA: Diagnosis not present

## 2020-01-25 DIAGNOSIS — H409 Unspecified glaucoma: Secondary | ICD-10-CM | POA: Diagnosis not present

## 2020-01-25 DIAGNOSIS — S32501D Unspecified fracture of right pubis, subsequent encounter for fracture with routine healing: Secondary | ICD-10-CM | POA: Diagnosis not present

## 2020-01-25 DIAGNOSIS — I10 Essential (primary) hypertension: Secondary | ICD-10-CM | POA: Diagnosis not present

## 2020-01-26 DIAGNOSIS — M6281 Muscle weakness (generalized): Secondary | ICD-10-CM | POA: Diagnosis not present

## 2020-01-26 DIAGNOSIS — I10 Essential (primary) hypertension: Secondary | ICD-10-CM | POA: Diagnosis not present

## 2020-01-26 DIAGNOSIS — S32501D Unspecified fracture of right pubis, subsequent encounter for fracture with routine healing: Secondary | ICD-10-CM | POA: Diagnosis not present

## 2020-01-26 DIAGNOSIS — I4891 Unspecified atrial fibrillation: Secondary | ICD-10-CM | POA: Diagnosis not present

## 2020-01-26 DIAGNOSIS — F0390 Unspecified dementia without behavioral disturbance: Secondary | ICD-10-CM | POA: Diagnosis not present

## 2020-01-26 DIAGNOSIS — H409 Unspecified glaucoma: Secondary | ICD-10-CM | POA: Diagnosis not present

## 2020-01-27 DIAGNOSIS — H409 Unspecified glaucoma: Secondary | ICD-10-CM | POA: Diagnosis not present

## 2020-01-27 DIAGNOSIS — I4891 Unspecified atrial fibrillation: Secondary | ICD-10-CM | POA: Diagnosis not present

## 2020-01-27 DIAGNOSIS — I10 Essential (primary) hypertension: Secondary | ICD-10-CM | POA: Diagnosis not present

## 2020-01-27 DIAGNOSIS — F0390 Unspecified dementia without behavioral disturbance: Secondary | ICD-10-CM | POA: Diagnosis not present

## 2020-01-27 DIAGNOSIS — M6281 Muscle weakness (generalized): Secondary | ICD-10-CM | POA: Diagnosis not present

## 2020-01-27 DIAGNOSIS — S32501D Unspecified fracture of right pubis, subsequent encounter for fracture with routine healing: Secondary | ICD-10-CM | POA: Diagnosis not present

## 2020-01-30 DIAGNOSIS — Z20828 Contact with and (suspected) exposure to other viral communicable diseases: Secondary | ICD-10-CM | POA: Diagnosis not present

## 2020-01-30 DIAGNOSIS — F0390 Unspecified dementia without behavioral disturbance: Secondary | ICD-10-CM | POA: Diagnosis not present

## 2020-01-30 DIAGNOSIS — H409 Unspecified glaucoma: Secondary | ICD-10-CM | POA: Diagnosis not present

## 2020-01-30 DIAGNOSIS — I739 Peripheral vascular disease, unspecified: Secondary | ICD-10-CM | POA: Diagnosis not present

## 2020-01-30 DIAGNOSIS — B351 Tinea unguium: Secondary | ICD-10-CM | POA: Diagnosis not present

## 2020-01-30 DIAGNOSIS — M6281 Muscle weakness (generalized): Secondary | ICD-10-CM | POA: Diagnosis not present

## 2020-01-30 DIAGNOSIS — S32501D Unspecified fracture of right pubis, subsequent encounter for fracture with routine healing: Secondary | ICD-10-CM | POA: Diagnosis not present

## 2020-01-30 DIAGNOSIS — I10 Essential (primary) hypertension: Secondary | ICD-10-CM | POA: Diagnosis not present

## 2020-01-30 DIAGNOSIS — I4891 Unspecified atrial fibrillation: Secondary | ICD-10-CM | POA: Diagnosis not present

## 2020-01-30 DIAGNOSIS — L603 Nail dystrophy: Secondary | ICD-10-CM | POA: Diagnosis not present

## 2020-01-31 DIAGNOSIS — I10 Essential (primary) hypertension: Secondary | ICD-10-CM | POA: Diagnosis not present

## 2020-01-31 DIAGNOSIS — M6281 Muscle weakness (generalized): Secondary | ICD-10-CM | POA: Diagnosis not present

## 2020-01-31 DIAGNOSIS — F0390 Unspecified dementia without behavioral disturbance: Secondary | ICD-10-CM | POA: Diagnosis not present

## 2020-01-31 DIAGNOSIS — H409 Unspecified glaucoma: Secondary | ICD-10-CM | POA: Diagnosis not present

## 2020-01-31 DIAGNOSIS — S32501D Unspecified fracture of right pubis, subsequent encounter for fracture with routine healing: Secondary | ICD-10-CM | POA: Diagnosis not present

## 2020-01-31 DIAGNOSIS — I4891 Unspecified atrial fibrillation: Secondary | ICD-10-CM | POA: Diagnosis not present

## 2020-02-01 DIAGNOSIS — I4891 Unspecified atrial fibrillation: Secondary | ICD-10-CM | POA: Diagnosis not present

## 2020-02-01 DIAGNOSIS — H409 Unspecified glaucoma: Secondary | ICD-10-CM | POA: Diagnosis not present

## 2020-02-01 DIAGNOSIS — F0390 Unspecified dementia without behavioral disturbance: Secondary | ICD-10-CM | POA: Diagnosis not present

## 2020-02-01 DIAGNOSIS — I10 Essential (primary) hypertension: Secondary | ICD-10-CM | POA: Diagnosis not present

## 2020-02-01 DIAGNOSIS — S32501D Unspecified fracture of right pubis, subsequent encounter for fracture with routine healing: Secondary | ICD-10-CM | POA: Diagnosis not present

## 2020-02-01 DIAGNOSIS — M6281 Muscle weakness (generalized): Secondary | ICD-10-CM | POA: Diagnosis not present

## 2020-02-02 DIAGNOSIS — F0390 Unspecified dementia without behavioral disturbance: Secondary | ICD-10-CM | POA: Diagnosis not present

## 2020-02-02 DIAGNOSIS — M6281 Muscle weakness (generalized): Secondary | ICD-10-CM | POA: Diagnosis not present

## 2020-02-02 DIAGNOSIS — I4891 Unspecified atrial fibrillation: Secondary | ICD-10-CM | POA: Diagnosis not present

## 2020-02-02 DIAGNOSIS — H409 Unspecified glaucoma: Secondary | ICD-10-CM | POA: Diagnosis not present

## 2020-02-02 DIAGNOSIS — I10 Essential (primary) hypertension: Secondary | ICD-10-CM | POA: Diagnosis not present

## 2020-02-02 DIAGNOSIS — S32501D Unspecified fracture of right pubis, subsequent encounter for fracture with routine healing: Secondary | ICD-10-CM | POA: Diagnosis not present

## 2020-02-03 DIAGNOSIS — F0390 Unspecified dementia without behavioral disturbance: Secondary | ICD-10-CM | POA: Diagnosis not present

## 2020-02-03 DIAGNOSIS — I4891 Unspecified atrial fibrillation: Secondary | ICD-10-CM | POA: Diagnosis not present

## 2020-02-03 DIAGNOSIS — M6281 Muscle weakness (generalized): Secondary | ICD-10-CM | POA: Diagnosis not present

## 2020-02-03 DIAGNOSIS — I10 Essential (primary) hypertension: Secondary | ICD-10-CM | POA: Diagnosis not present

## 2020-02-03 DIAGNOSIS — H409 Unspecified glaucoma: Secondary | ICD-10-CM | POA: Diagnosis not present

## 2020-02-03 DIAGNOSIS — S32501D Unspecified fracture of right pubis, subsequent encounter for fracture with routine healing: Secondary | ICD-10-CM | POA: Diagnosis not present

## 2020-02-06 DIAGNOSIS — I10 Essential (primary) hypertension: Secondary | ICD-10-CM | POA: Diagnosis not present

## 2020-02-06 DIAGNOSIS — H409 Unspecified glaucoma: Secondary | ICD-10-CM | POA: Diagnosis not present

## 2020-02-06 DIAGNOSIS — S32501D Unspecified fracture of right pubis, subsequent encounter for fracture with routine healing: Secondary | ICD-10-CM | POA: Diagnosis not present

## 2020-02-06 DIAGNOSIS — F0390 Unspecified dementia without behavioral disturbance: Secondary | ICD-10-CM | POA: Diagnosis not present

## 2020-02-06 DIAGNOSIS — M6281 Muscle weakness (generalized): Secondary | ICD-10-CM | POA: Diagnosis not present

## 2020-02-06 DIAGNOSIS — I4891 Unspecified atrial fibrillation: Secondary | ICD-10-CM | POA: Diagnosis not present

## 2020-02-07 DIAGNOSIS — M6281 Muscle weakness (generalized): Secondary | ICD-10-CM | POA: Diagnosis not present

## 2020-02-07 DIAGNOSIS — S32501D Unspecified fracture of right pubis, subsequent encounter for fracture with routine healing: Secondary | ICD-10-CM | POA: Diagnosis not present

## 2020-02-07 DIAGNOSIS — I10 Essential (primary) hypertension: Secondary | ICD-10-CM | POA: Diagnosis not present

## 2020-02-07 DIAGNOSIS — F0390 Unspecified dementia without behavioral disturbance: Secondary | ICD-10-CM | POA: Diagnosis not present

## 2020-02-07 DIAGNOSIS — H409 Unspecified glaucoma: Secondary | ICD-10-CM | POA: Diagnosis not present

## 2020-02-07 DIAGNOSIS — I4891 Unspecified atrial fibrillation: Secondary | ICD-10-CM | POA: Diagnosis not present

## 2020-02-08 DIAGNOSIS — S32501D Unspecified fracture of right pubis, subsequent encounter for fracture with routine healing: Secondary | ICD-10-CM | POA: Diagnosis not present

## 2020-02-08 DIAGNOSIS — F0390 Unspecified dementia without behavioral disturbance: Secondary | ICD-10-CM | POA: Diagnosis not present

## 2020-02-08 DIAGNOSIS — M6281 Muscle weakness (generalized): Secondary | ICD-10-CM | POA: Diagnosis not present

## 2020-02-08 DIAGNOSIS — H409 Unspecified glaucoma: Secondary | ICD-10-CM | POA: Diagnosis not present

## 2020-02-08 DIAGNOSIS — I4891 Unspecified atrial fibrillation: Secondary | ICD-10-CM | POA: Diagnosis not present

## 2020-02-08 DIAGNOSIS — I10 Essential (primary) hypertension: Secondary | ICD-10-CM | POA: Diagnosis not present

## 2020-02-09 DIAGNOSIS — S32501D Unspecified fracture of right pubis, subsequent encounter for fracture with routine healing: Secondary | ICD-10-CM | POA: Diagnosis not present

## 2020-02-09 DIAGNOSIS — M6281 Muscle weakness (generalized): Secondary | ICD-10-CM | POA: Diagnosis not present

## 2020-02-09 DIAGNOSIS — F0390 Unspecified dementia without behavioral disturbance: Secondary | ICD-10-CM | POA: Diagnosis not present

## 2020-02-09 DIAGNOSIS — I10 Essential (primary) hypertension: Secondary | ICD-10-CM | POA: Diagnosis not present

## 2020-02-09 DIAGNOSIS — I4891 Unspecified atrial fibrillation: Secondary | ICD-10-CM | POA: Diagnosis not present

## 2020-02-09 DIAGNOSIS — W19XXXD Unspecified fall, subsequent encounter: Secondary | ICD-10-CM | POA: Diagnosis not present

## 2020-02-09 DIAGNOSIS — H409 Unspecified glaucoma: Secondary | ICD-10-CM | POA: Diagnosis not present

## 2020-02-09 DIAGNOSIS — R278 Other lack of coordination: Secondary | ICD-10-CM | POA: Diagnosis not present

## 2020-02-09 DIAGNOSIS — F331 Major depressive disorder, recurrent, moderate: Secondary | ICD-10-CM | POA: Diagnosis not present

## 2020-02-09 DIAGNOSIS — E785 Hyperlipidemia, unspecified: Secondary | ICD-10-CM | POA: Diagnosis not present

## 2020-02-10 DIAGNOSIS — H409 Unspecified glaucoma: Secondary | ICD-10-CM | POA: Diagnosis not present

## 2020-02-10 DIAGNOSIS — S32501D Unspecified fracture of right pubis, subsequent encounter for fracture with routine healing: Secondary | ICD-10-CM | POA: Diagnosis not present

## 2020-02-10 DIAGNOSIS — W19XXXD Unspecified fall, subsequent encounter: Secondary | ICD-10-CM | POA: Diagnosis not present

## 2020-02-10 DIAGNOSIS — I4891 Unspecified atrial fibrillation: Secondary | ICD-10-CM | POA: Diagnosis not present

## 2020-02-10 DIAGNOSIS — F0390 Unspecified dementia without behavioral disturbance: Secondary | ICD-10-CM | POA: Diagnosis not present

## 2020-02-10 DIAGNOSIS — I10 Essential (primary) hypertension: Secondary | ICD-10-CM | POA: Diagnosis not present

## 2020-02-13 DIAGNOSIS — W19XXXD Unspecified fall, subsequent encounter: Secondary | ICD-10-CM | POA: Diagnosis not present

## 2020-02-13 DIAGNOSIS — I10 Essential (primary) hypertension: Secondary | ICD-10-CM | POA: Diagnosis not present

## 2020-02-13 DIAGNOSIS — H409 Unspecified glaucoma: Secondary | ICD-10-CM | POA: Diagnosis not present

## 2020-02-13 DIAGNOSIS — I4891 Unspecified atrial fibrillation: Secondary | ICD-10-CM | POA: Diagnosis not present

## 2020-02-13 DIAGNOSIS — F0390 Unspecified dementia without behavioral disturbance: Secondary | ICD-10-CM | POA: Diagnosis not present

## 2020-02-13 DIAGNOSIS — S32501D Unspecified fracture of right pubis, subsequent encounter for fracture with routine healing: Secondary | ICD-10-CM | POA: Diagnosis not present

## 2020-02-14 DIAGNOSIS — S32501D Unspecified fracture of right pubis, subsequent encounter for fracture with routine healing: Secondary | ICD-10-CM | POA: Diagnosis not present

## 2020-02-14 DIAGNOSIS — F0390 Unspecified dementia without behavioral disturbance: Secondary | ICD-10-CM | POA: Diagnosis not present

## 2020-02-14 DIAGNOSIS — H409 Unspecified glaucoma: Secondary | ICD-10-CM | POA: Diagnosis not present

## 2020-02-14 DIAGNOSIS — W19XXXD Unspecified fall, subsequent encounter: Secondary | ICD-10-CM | POA: Diagnosis not present

## 2020-02-14 DIAGNOSIS — I4891 Unspecified atrial fibrillation: Secondary | ICD-10-CM | POA: Diagnosis not present

## 2020-02-14 DIAGNOSIS — I10 Essential (primary) hypertension: Secondary | ICD-10-CM | POA: Diagnosis not present

## 2020-02-15 DIAGNOSIS — S32501D Unspecified fracture of right pubis, subsequent encounter for fracture with routine healing: Secondary | ICD-10-CM | POA: Diagnosis not present

## 2020-02-15 DIAGNOSIS — W19XXXD Unspecified fall, subsequent encounter: Secondary | ICD-10-CM | POA: Diagnosis not present

## 2020-02-15 DIAGNOSIS — H409 Unspecified glaucoma: Secondary | ICD-10-CM | POA: Diagnosis not present

## 2020-02-15 DIAGNOSIS — I4891 Unspecified atrial fibrillation: Secondary | ICD-10-CM | POA: Diagnosis not present

## 2020-02-15 DIAGNOSIS — F0390 Unspecified dementia without behavioral disturbance: Secondary | ICD-10-CM | POA: Diagnosis not present

## 2020-02-15 DIAGNOSIS — I10 Essential (primary) hypertension: Secondary | ICD-10-CM | POA: Diagnosis not present

## 2020-02-16 DIAGNOSIS — I4891 Unspecified atrial fibrillation: Secondary | ICD-10-CM | POA: Diagnosis not present

## 2020-02-16 DIAGNOSIS — I10 Essential (primary) hypertension: Secondary | ICD-10-CM | POA: Diagnosis not present

## 2020-02-16 DIAGNOSIS — H409 Unspecified glaucoma: Secondary | ICD-10-CM | POA: Diagnosis not present

## 2020-02-16 DIAGNOSIS — F0390 Unspecified dementia without behavioral disturbance: Secondary | ICD-10-CM | POA: Diagnosis not present

## 2020-02-16 DIAGNOSIS — S32501D Unspecified fracture of right pubis, subsequent encounter for fracture with routine healing: Secondary | ICD-10-CM | POA: Diagnosis not present

## 2020-02-16 DIAGNOSIS — W19XXXD Unspecified fall, subsequent encounter: Secondary | ICD-10-CM | POA: Diagnosis not present

## 2020-02-22 DIAGNOSIS — I4891 Unspecified atrial fibrillation: Secondary | ICD-10-CM | POA: Diagnosis not present

## 2020-02-22 DIAGNOSIS — F0390 Unspecified dementia without behavioral disturbance: Secondary | ICD-10-CM | POA: Diagnosis not present

## 2020-02-22 DIAGNOSIS — S329XXA Fracture of unspecified parts of lumbosacral spine and pelvis, initial encounter for closed fracture: Secondary | ICD-10-CM | POA: Diagnosis not present

## 2020-02-22 DIAGNOSIS — M810 Age-related osteoporosis without current pathological fracture: Secondary | ICD-10-CM | POA: Diagnosis not present

## 2020-02-24 DIAGNOSIS — Z20828 Contact with and (suspected) exposure to other viral communicable diseases: Secondary | ICD-10-CM | POA: Diagnosis not present

## 2020-02-29 DIAGNOSIS — Z20828 Contact with and (suspected) exposure to other viral communicable diseases: Secondary | ICD-10-CM | POA: Diagnosis not present

## 2020-03-02 DIAGNOSIS — E785 Hyperlipidemia, unspecified: Secondary | ICD-10-CM | POA: Diagnosis not present

## 2020-03-02 DIAGNOSIS — I4891 Unspecified atrial fibrillation: Secondary | ICD-10-CM | POA: Diagnosis not present

## 2020-03-02 DIAGNOSIS — D649 Anemia, unspecified: Secondary | ICD-10-CM | POA: Diagnosis not present

## 2020-03-02 DIAGNOSIS — I10 Essential (primary) hypertension: Secondary | ICD-10-CM | POA: Diagnosis not present

## 2020-03-06 DIAGNOSIS — Z20828 Contact with and (suspected) exposure to other viral communicable diseases: Secondary | ICD-10-CM | POA: Diagnosis not present

## 2020-03-20 DIAGNOSIS — Z20828 Contact with and (suspected) exposure to other viral communicable diseases: Secondary | ICD-10-CM | POA: Diagnosis not present

## 2020-04-02 DIAGNOSIS — I739 Peripheral vascular disease, unspecified: Secondary | ICD-10-CM | POA: Diagnosis not present

## 2020-04-02 DIAGNOSIS — L603 Nail dystrophy: Secondary | ICD-10-CM | POA: Diagnosis not present

## 2020-04-02 DIAGNOSIS — Q845 Enlarged and hypertrophic nails: Secondary | ICD-10-CM | POA: Diagnosis not present

## 2020-04-10 DIAGNOSIS — M810 Age-related osteoporosis without current pathological fracture: Secondary | ICD-10-CM | POA: Diagnosis not present

## 2020-04-10 DIAGNOSIS — I10 Essential (primary) hypertension: Secondary | ICD-10-CM | POA: Diagnosis not present

## 2020-04-10 DIAGNOSIS — F329 Major depressive disorder, single episode, unspecified: Secondary | ICD-10-CM | POA: Diagnosis not present

## 2020-04-10 DIAGNOSIS — M62830 Muscle spasm of back: Secondary | ICD-10-CM | POA: Diagnosis not present

## 2020-04-10 DIAGNOSIS — F0390 Unspecified dementia without behavioral disturbance: Secondary | ICD-10-CM | POA: Diagnosis not present

## 2020-04-14 DIAGNOSIS — Z03818 Encounter for observation for suspected exposure to other biological agents ruled out: Secondary | ICD-10-CM | POA: Diagnosis not present

## 2020-04-17 DIAGNOSIS — M545 Low back pain: Secondary | ICD-10-CM | POA: Diagnosis not present

## 2020-04-18 DIAGNOSIS — Z20828 Contact with and (suspected) exposure to other viral communicable diseases: Secondary | ICD-10-CM | POA: Diagnosis not present

## 2020-04-23 DIAGNOSIS — Z03818 Encounter for observation for suspected exposure to other biological agents ruled out: Secondary | ICD-10-CM | POA: Diagnosis not present

## 2020-04-24 DIAGNOSIS — F329 Major depressive disorder, single episode, unspecified: Secondary | ICD-10-CM | POA: Diagnosis not present

## 2020-04-24 DIAGNOSIS — W19XXXA Unspecified fall, initial encounter: Secondary | ICD-10-CM | POA: Diagnosis not present

## 2020-04-24 DIAGNOSIS — I480 Paroxysmal atrial fibrillation: Secondary | ICD-10-CM | POA: Diagnosis not present

## 2020-04-24 DIAGNOSIS — F0390 Unspecified dementia without behavioral disturbance: Secondary | ICD-10-CM | POA: Diagnosis not present

## 2020-04-24 DIAGNOSIS — H409 Unspecified glaucoma: Secondary | ICD-10-CM | POA: Diagnosis not present

## 2020-04-24 DIAGNOSIS — M810 Age-related osteoporosis without current pathological fracture: Secondary | ICD-10-CM | POA: Diagnosis not present

## 2020-04-24 DIAGNOSIS — I10 Essential (primary) hypertension: Secondary | ICD-10-CM | POA: Diagnosis not present

## 2020-04-27 DIAGNOSIS — R42 Dizziness and giddiness: Secondary | ICD-10-CM | POA: Diagnosis not present

## 2020-04-27 DIAGNOSIS — I10 Essential (primary) hypertension: Secondary | ICD-10-CM | POA: Diagnosis not present

## 2020-04-27 DIAGNOSIS — M62830 Muscle spasm of back: Secondary | ICD-10-CM | POA: Diagnosis not present

## 2020-04-27 DIAGNOSIS — F329 Major depressive disorder, single episode, unspecified: Secondary | ICD-10-CM | POA: Diagnosis not present

## 2020-04-27 DIAGNOSIS — F0390 Unspecified dementia without behavioral disturbance: Secondary | ICD-10-CM | POA: Diagnosis not present

## 2020-04-27 DIAGNOSIS — W19XXXA Unspecified fall, initial encounter: Secondary | ICD-10-CM | POA: Diagnosis not present

## 2020-04-27 DIAGNOSIS — I480 Paroxysmal atrial fibrillation: Secondary | ICD-10-CM | POA: Diagnosis not present

## 2020-05-10 DIAGNOSIS — S32501D Unspecified fracture of right pubis, subsequent encounter for fracture with routine healing: Secondary | ICD-10-CM | POA: Diagnosis not present

## 2020-05-10 DIAGNOSIS — I4891 Unspecified atrial fibrillation: Secondary | ICD-10-CM | POA: Diagnosis not present

## 2020-05-10 DIAGNOSIS — F331 Major depressive disorder, recurrent, moderate: Secondary | ICD-10-CM | POA: Diagnosis not present

## 2020-05-10 DIAGNOSIS — F0390 Unspecified dementia without behavioral disturbance: Secondary | ICD-10-CM | POA: Diagnosis not present

## 2020-05-10 DIAGNOSIS — W19XXXD Unspecified fall, subsequent encounter: Secondary | ICD-10-CM | POA: Diagnosis not present

## 2020-05-10 DIAGNOSIS — I10 Essential (primary) hypertension: Secondary | ICD-10-CM | POA: Diagnosis not present

## 2020-05-10 DIAGNOSIS — E785 Hyperlipidemia, unspecified: Secondary | ICD-10-CM | POA: Diagnosis not present

## 2020-05-10 DIAGNOSIS — H409 Unspecified glaucoma: Secondary | ICD-10-CM | POA: Diagnosis not present

## 2020-05-10 DIAGNOSIS — M6281 Muscle weakness (generalized): Secondary | ICD-10-CM | POA: Diagnosis not present

## 2020-05-11 DIAGNOSIS — S32501D Unspecified fracture of right pubis, subsequent encounter for fracture with routine healing: Secondary | ICD-10-CM | POA: Diagnosis not present

## 2020-05-11 DIAGNOSIS — H409 Unspecified glaucoma: Secondary | ICD-10-CM | POA: Diagnosis not present

## 2020-05-11 DIAGNOSIS — F0390 Unspecified dementia without behavioral disturbance: Secondary | ICD-10-CM | POA: Diagnosis not present

## 2020-05-11 DIAGNOSIS — I10 Essential (primary) hypertension: Secondary | ICD-10-CM | POA: Diagnosis not present

## 2020-05-11 DIAGNOSIS — I4891 Unspecified atrial fibrillation: Secondary | ICD-10-CM | POA: Diagnosis not present

## 2020-05-11 DIAGNOSIS — W19XXXD Unspecified fall, subsequent encounter: Secondary | ICD-10-CM | POA: Diagnosis not present

## 2020-05-14 DIAGNOSIS — H409 Unspecified glaucoma: Secondary | ICD-10-CM | POA: Diagnosis not present

## 2020-05-14 DIAGNOSIS — I10 Essential (primary) hypertension: Secondary | ICD-10-CM | POA: Diagnosis not present

## 2020-05-14 DIAGNOSIS — F0390 Unspecified dementia without behavioral disturbance: Secondary | ICD-10-CM | POA: Diagnosis not present

## 2020-05-14 DIAGNOSIS — I4891 Unspecified atrial fibrillation: Secondary | ICD-10-CM | POA: Diagnosis not present

## 2020-05-14 DIAGNOSIS — S32501D Unspecified fracture of right pubis, subsequent encounter for fracture with routine healing: Secondary | ICD-10-CM | POA: Diagnosis not present

## 2020-05-14 DIAGNOSIS — W19XXXD Unspecified fall, subsequent encounter: Secondary | ICD-10-CM | POA: Diagnosis not present

## 2020-05-15 DIAGNOSIS — H409 Unspecified glaucoma: Secondary | ICD-10-CM | POA: Diagnosis not present

## 2020-05-15 DIAGNOSIS — I4891 Unspecified atrial fibrillation: Secondary | ICD-10-CM | POA: Diagnosis not present

## 2020-05-15 DIAGNOSIS — W19XXXD Unspecified fall, subsequent encounter: Secondary | ICD-10-CM | POA: Diagnosis not present

## 2020-05-15 DIAGNOSIS — S32501D Unspecified fracture of right pubis, subsequent encounter for fracture with routine healing: Secondary | ICD-10-CM | POA: Diagnosis not present

## 2020-05-15 DIAGNOSIS — I10 Essential (primary) hypertension: Secondary | ICD-10-CM | POA: Diagnosis not present

## 2020-05-15 DIAGNOSIS — F0390 Unspecified dementia without behavioral disturbance: Secondary | ICD-10-CM | POA: Diagnosis not present

## 2020-05-16 DIAGNOSIS — H409 Unspecified glaucoma: Secondary | ICD-10-CM | POA: Diagnosis not present

## 2020-05-16 DIAGNOSIS — S32501D Unspecified fracture of right pubis, subsequent encounter for fracture with routine healing: Secondary | ICD-10-CM | POA: Diagnosis not present

## 2020-05-16 DIAGNOSIS — I10 Essential (primary) hypertension: Secondary | ICD-10-CM | POA: Diagnosis not present

## 2020-05-16 DIAGNOSIS — W19XXXD Unspecified fall, subsequent encounter: Secondary | ICD-10-CM | POA: Diagnosis not present

## 2020-05-16 DIAGNOSIS — I4891 Unspecified atrial fibrillation: Secondary | ICD-10-CM | POA: Diagnosis not present

## 2020-05-16 DIAGNOSIS — F0390 Unspecified dementia without behavioral disturbance: Secondary | ICD-10-CM | POA: Diagnosis not present

## 2020-05-17 DIAGNOSIS — H409 Unspecified glaucoma: Secondary | ICD-10-CM | POA: Diagnosis not present

## 2020-05-17 DIAGNOSIS — F0390 Unspecified dementia without behavioral disturbance: Secondary | ICD-10-CM | POA: Diagnosis not present

## 2020-05-17 DIAGNOSIS — S32501D Unspecified fracture of right pubis, subsequent encounter for fracture with routine healing: Secondary | ICD-10-CM | POA: Diagnosis not present

## 2020-05-17 DIAGNOSIS — I4891 Unspecified atrial fibrillation: Secondary | ICD-10-CM | POA: Diagnosis not present

## 2020-05-17 DIAGNOSIS — I10 Essential (primary) hypertension: Secondary | ICD-10-CM | POA: Diagnosis not present

## 2020-05-17 DIAGNOSIS — W19XXXD Unspecified fall, subsequent encounter: Secondary | ICD-10-CM | POA: Diagnosis not present

## 2020-05-18 DIAGNOSIS — H409 Unspecified glaucoma: Secondary | ICD-10-CM | POA: Diagnosis not present

## 2020-05-18 DIAGNOSIS — I10 Essential (primary) hypertension: Secondary | ICD-10-CM | POA: Diagnosis not present

## 2020-05-18 DIAGNOSIS — F0390 Unspecified dementia without behavioral disturbance: Secondary | ICD-10-CM | POA: Diagnosis not present

## 2020-05-18 DIAGNOSIS — W19XXXD Unspecified fall, subsequent encounter: Secondary | ICD-10-CM | POA: Diagnosis not present

## 2020-05-18 DIAGNOSIS — S32501D Unspecified fracture of right pubis, subsequent encounter for fracture with routine healing: Secondary | ICD-10-CM | POA: Diagnosis not present

## 2020-05-18 DIAGNOSIS — I4891 Unspecified atrial fibrillation: Secondary | ICD-10-CM | POA: Diagnosis not present

## 2020-05-21 DIAGNOSIS — W19XXXD Unspecified fall, subsequent encounter: Secondary | ICD-10-CM | POA: Diagnosis not present

## 2020-05-21 DIAGNOSIS — I10 Essential (primary) hypertension: Secondary | ICD-10-CM | POA: Diagnosis not present

## 2020-05-21 DIAGNOSIS — I4891 Unspecified atrial fibrillation: Secondary | ICD-10-CM | POA: Diagnosis not present

## 2020-05-21 DIAGNOSIS — H409 Unspecified glaucoma: Secondary | ICD-10-CM | POA: Diagnosis not present

## 2020-05-21 DIAGNOSIS — S32501D Unspecified fracture of right pubis, subsequent encounter for fracture with routine healing: Secondary | ICD-10-CM | POA: Diagnosis not present

## 2020-05-21 DIAGNOSIS — F0390 Unspecified dementia without behavioral disturbance: Secondary | ICD-10-CM | POA: Diagnosis not present

## 2020-05-22 DIAGNOSIS — H409 Unspecified glaucoma: Secondary | ICD-10-CM | POA: Diagnosis not present

## 2020-05-22 DIAGNOSIS — W19XXXD Unspecified fall, subsequent encounter: Secondary | ICD-10-CM | POA: Diagnosis not present

## 2020-05-22 DIAGNOSIS — I10 Essential (primary) hypertension: Secondary | ICD-10-CM | POA: Diagnosis not present

## 2020-05-22 DIAGNOSIS — F0390 Unspecified dementia without behavioral disturbance: Secondary | ICD-10-CM | POA: Diagnosis not present

## 2020-05-22 DIAGNOSIS — S32501D Unspecified fracture of right pubis, subsequent encounter for fracture with routine healing: Secondary | ICD-10-CM | POA: Diagnosis not present

## 2020-05-22 DIAGNOSIS — I4891 Unspecified atrial fibrillation: Secondary | ICD-10-CM | POA: Diagnosis not present

## 2020-05-23 DIAGNOSIS — I4891 Unspecified atrial fibrillation: Secondary | ICD-10-CM | POA: Diagnosis not present

## 2020-05-23 DIAGNOSIS — S32501D Unspecified fracture of right pubis, subsequent encounter for fracture with routine healing: Secondary | ICD-10-CM | POA: Diagnosis not present

## 2020-05-23 DIAGNOSIS — W19XXXD Unspecified fall, subsequent encounter: Secondary | ICD-10-CM | POA: Diagnosis not present

## 2020-05-23 DIAGNOSIS — F0390 Unspecified dementia without behavioral disturbance: Secondary | ICD-10-CM | POA: Diagnosis not present

## 2020-05-23 DIAGNOSIS — I10 Essential (primary) hypertension: Secondary | ICD-10-CM | POA: Diagnosis not present

## 2020-05-23 DIAGNOSIS — H409 Unspecified glaucoma: Secondary | ICD-10-CM | POA: Diagnosis not present

## 2020-05-24 DIAGNOSIS — I4891 Unspecified atrial fibrillation: Secondary | ICD-10-CM | POA: Diagnosis not present

## 2020-05-24 DIAGNOSIS — H409 Unspecified glaucoma: Secondary | ICD-10-CM | POA: Diagnosis not present

## 2020-05-24 DIAGNOSIS — S32501D Unspecified fracture of right pubis, subsequent encounter for fracture with routine healing: Secondary | ICD-10-CM | POA: Diagnosis not present

## 2020-05-24 DIAGNOSIS — E876 Hypokalemia: Secondary | ICD-10-CM | POA: Diagnosis not present

## 2020-05-24 DIAGNOSIS — F0390 Unspecified dementia without behavioral disturbance: Secondary | ICD-10-CM | POA: Diagnosis not present

## 2020-05-24 DIAGNOSIS — I10 Essential (primary) hypertension: Secondary | ICD-10-CM | POA: Diagnosis not present

## 2020-05-24 DIAGNOSIS — W19XXXD Unspecified fall, subsequent encounter: Secondary | ICD-10-CM | POA: Diagnosis not present

## 2020-05-25 DIAGNOSIS — I4891 Unspecified atrial fibrillation: Secondary | ICD-10-CM | POA: Diagnosis not present

## 2020-05-25 DIAGNOSIS — H409 Unspecified glaucoma: Secondary | ICD-10-CM | POA: Diagnosis not present

## 2020-05-25 DIAGNOSIS — I10 Essential (primary) hypertension: Secondary | ICD-10-CM | POA: Diagnosis not present

## 2020-05-25 DIAGNOSIS — S32501D Unspecified fracture of right pubis, subsequent encounter for fracture with routine healing: Secondary | ICD-10-CM | POA: Diagnosis not present

## 2020-05-25 DIAGNOSIS — F0390 Unspecified dementia without behavioral disturbance: Secondary | ICD-10-CM | POA: Diagnosis not present

## 2020-05-25 DIAGNOSIS — W19XXXD Unspecified fall, subsequent encounter: Secondary | ICD-10-CM | POA: Diagnosis not present

## 2020-05-25 DIAGNOSIS — E876 Hypokalemia: Secondary | ICD-10-CM | POA: Diagnosis not present

## 2020-05-28 ENCOUNTER — Emergency Department (HOSPITAL_COMMUNITY): Payer: Medicare Other

## 2020-05-28 ENCOUNTER — Inpatient Hospital Stay (HOSPITAL_COMMUNITY)
Admission: EM | Admit: 2020-05-28 | Discharge: 2020-06-01 | DRG: 101 | Disposition: A | Discharge status: Skilled Nursing Facility | Payer: Medicare Other | Source: Skilled Nursing Facility | Attending: Internal Medicine | Admitting: Internal Medicine

## 2020-05-28 DIAGNOSIS — R29733 NIHSS score 33: Secondary | ICD-10-CM | POA: Diagnosis present

## 2020-05-28 DIAGNOSIS — I4891 Unspecified atrial fibrillation: Secondary | ICD-10-CM | POA: Diagnosis not present

## 2020-05-28 DIAGNOSIS — T434X5A Adverse effect of butyrophenone and thiothixene neuroleptics, initial encounter: Secondary | ICD-10-CM | POA: Diagnosis not present

## 2020-05-28 DIAGNOSIS — Z66 Do not resuscitate: Secondary | ICD-10-CM | POA: Diagnosis present

## 2020-05-28 DIAGNOSIS — Z7401 Bed confinement status: Secondary | ICD-10-CM | POA: Diagnosis not present

## 2020-05-28 DIAGNOSIS — E876 Hypokalemia: Secondary | ICD-10-CM | POA: Diagnosis present

## 2020-05-28 DIAGNOSIS — I639 Cerebral infarction, unspecified: Secondary | ICD-10-CM | POA: Diagnosis not present

## 2020-05-28 DIAGNOSIS — E785 Hyperlipidemia, unspecified: Secondary | ICD-10-CM | POA: Diagnosis present

## 2020-05-28 DIAGNOSIS — Z1611 Resistance to penicillins: Secondary | ICD-10-CM | POA: Diagnosis present

## 2020-05-28 DIAGNOSIS — F0390 Unspecified dementia without behavioral disturbance: Secondary | ICD-10-CM | POA: Diagnosis present

## 2020-05-28 DIAGNOSIS — R5383 Other fatigue: Secondary | ICD-10-CM | POA: Diagnosis not present

## 2020-05-28 DIAGNOSIS — Z79899 Other long term (current) drug therapy: Secondary | ICD-10-CM

## 2020-05-28 DIAGNOSIS — Z90710 Acquired absence of both cervix and uterus: Secondary | ICD-10-CM

## 2020-05-28 DIAGNOSIS — B961 Klebsiella pneumoniae [K. pneumoniae] as the cause of diseases classified elsewhere: Secondary | ICD-10-CM | POA: Diagnosis present

## 2020-05-28 DIAGNOSIS — Z8616 Personal history of COVID-19: Secondary | ICD-10-CM

## 2020-05-28 DIAGNOSIS — I10 Essential (primary) hypertension: Secondary | ICD-10-CM | POA: Diagnosis present

## 2020-05-28 DIAGNOSIS — M810 Age-related osteoporosis without current pathological fracture: Secondary | ICD-10-CM | POA: Diagnosis present

## 2020-05-28 DIAGNOSIS — R456 Violent behavior: Secondary | ICD-10-CM | POA: Diagnosis not present

## 2020-05-28 DIAGNOSIS — G8194 Hemiplegia, unspecified affecting left nondominant side: Secondary | ICD-10-CM | POA: Diagnosis present

## 2020-05-28 DIAGNOSIS — S32501D Unspecified fracture of right pubis, subsequent encounter for fracture with routine healing: Secondary | ICD-10-CM | POA: Diagnosis not present

## 2020-05-28 DIAGNOSIS — N3000 Acute cystitis without hematuria: Secondary | ICD-10-CM | POA: Diagnosis not present

## 2020-05-28 DIAGNOSIS — G319 Degenerative disease of nervous system, unspecified: Secondary | ICD-10-CM | POA: Diagnosis not present

## 2020-05-28 DIAGNOSIS — U071 COVID-19: Secondary | ICD-10-CM | POA: Diagnosis not present

## 2020-05-28 DIAGNOSIS — N179 Acute kidney failure, unspecified: Secondary | ICD-10-CM | POA: Diagnosis present

## 2020-05-28 DIAGNOSIS — G40901 Epilepsy, unspecified, not intractable, with status epilepticus: Principal | ICD-10-CM | POA: Diagnosis present

## 2020-05-28 DIAGNOSIS — J323 Chronic sphenoidal sinusitis: Secondary | ICD-10-CM | POA: Diagnosis not present

## 2020-05-28 DIAGNOSIS — M2550 Pain in unspecified joint: Secondary | ICD-10-CM | POA: Diagnosis not present

## 2020-05-28 DIAGNOSIS — R569 Unspecified convulsions: Secondary | ICD-10-CM | POA: Diagnosis not present

## 2020-05-28 DIAGNOSIS — I480 Paroxysmal atrial fibrillation: Secondary | ICD-10-CM

## 2020-05-28 DIAGNOSIS — N390 Urinary tract infection, site not specified: Secondary | ICD-10-CM | POA: Diagnosis present

## 2020-05-28 DIAGNOSIS — H409 Unspecified glaucoma: Secondary | ICD-10-CM | POA: Diagnosis not present

## 2020-05-28 DIAGNOSIS — F0391 Unspecified dementia with behavioral disturbance: Secondary | ICD-10-CM

## 2020-05-28 DIAGNOSIS — R4182 Altered mental status, unspecified: Secondary | ICD-10-CM | POA: Diagnosis not present

## 2020-05-28 DIAGNOSIS — I517 Cardiomegaly: Secondary | ICD-10-CM | POA: Diagnosis not present

## 2020-05-28 DIAGNOSIS — R9082 White matter disease, unspecified: Secondary | ICD-10-CM | POA: Diagnosis not present

## 2020-05-28 DIAGNOSIS — I6782 Cerebral ischemia: Secondary | ICD-10-CM | POA: Diagnosis not present

## 2020-05-28 DIAGNOSIS — W19XXXD Unspecified fall, subsequent encounter: Secondary | ICD-10-CM | POA: Diagnosis not present

## 2020-05-28 DIAGNOSIS — F331 Major depressive disorder, recurrent, moderate: Secondary | ICD-10-CM | POA: Diagnosis not present

## 2020-05-28 DIAGNOSIS — R29818 Other symptoms and signs involving the nervous system: Secondary | ICD-10-CM | POA: Diagnosis not present

## 2020-05-28 DIAGNOSIS — R404 Transient alteration of awareness: Secondary | ICD-10-CM | POA: Diagnosis not present

## 2020-05-28 DIAGNOSIS — I6389 Other cerebral infarction: Secondary | ICD-10-CM | POA: Diagnosis not present

## 2020-05-28 DIAGNOSIS — R61 Generalized hyperhidrosis: Secondary | ICD-10-CM | POA: Diagnosis not present

## 2020-05-28 DIAGNOSIS — G8384 Todd's paralysis (postepileptic): Secondary | ICD-10-CM | POA: Diagnosis present

## 2020-05-28 DIAGNOSIS — R000 Tachycardia, unspecified: Secondary | ICD-10-CM | POA: Diagnosis not present

## 2020-05-28 DIAGNOSIS — G40909 Epilepsy, unspecified, not intractable, without status epilepticus: Secondary | ICD-10-CM | POA: Diagnosis not present

## 2020-05-28 LAB — URINALYSIS, ROUTINE W REFLEX MICROSCOPIC
Bilirubin Urine: NEGATIVE
Glucose, UA: NEGATIVE mg/dL
Ketones, ur: NEGATIVE mg/dL
Nitrite: NEGATIVE
Protein, ur: NEGATIVE mg/dL
Specific Gravity, Urine: 1.006 (ref 1.005–1.030)
WBC, UA: >50 WBC/hpf — ABNORMAL HIGH (ref 0–5)
pH: 6 (ref 5.0–8.0)

## 2020-05-28 LAB — COMPREHENSIVE METABOLIC PANEL
ALT: 13 U/L (ref 0–44)
AST: 25 U/L (ref 15–41)
Albumin: 3.1 g/dL — ABNORMAL LOW (ref 3.5–5.0)
Alkaline Phosphatase: 108 U/L (ref 38–126)
Anion gap: 21 — ABNORMAL HIGH (ref 5–15)
BUN: 12 mg/dL (ref 8–23)
CO2: 19 mmol/L — ABNORMAL LOW (ref 22–32)
Calcium: 9.6 mg/dL (ref 8.9–10.3)
Chloride: 102 mmol/L (ref 98–111)
Creatinine, Ser: 1.28 mg/dL — ABNORMAL HIGH (ref 0.44–1.00)
GFR calc Af Amer: 42 mL/min — ABNORMAL LOW (ref >60)
GFR calc non Af Amer: 37 mL/min — ABNORMAL LOW (ref >60)
Glucose, Bld: 201 mg/dL — ABNORMAL HIGH (ref 70–99)
Potassium: 3.8 mmol/L (ref 3.5–5.1)
Sodium: 142 mmol/L (ref 135–145)
Total Bilirubin: 0.9 mg/dL (ref 0.3–1.2)
Total Protein: 7.6 g/dL (ref 6.5–8.1)

## 2020-05-28 LAB — RAPID URINE DRUG SCREEN, HOSP PERFORMED
Amphetamines: NOT DETECTED
Barbiturates: NOT DETECTED
Benzodiazepines: NOT DETECTED
Cocaine: NOT DETECTED
Opiates: POSITIVE — AB
Tetrahydrocannabinol: NOT DETECTED

## 2020-05-28 LAB — I-STAT CHEM 8, ED
BUN: 12 mg/dL (ref 8–23)
Calcium, Ion: 1.12 mmol/L — ABNORMAL LOW (ref 1.15–1.40)
Chloride: 104 mmol/L (ref 98–111)
Creatinine, Ser: 1.1 mg/dL — ABNORMAL HIGH (ref 0.44–1.00)
Glucose, Bld: 194 mg/dL — ABNORMAL HIGH (ref 70–99)
HCT: 38 % (ref 36.0–46.0)
Hemoglobin: 12.9 g/dL (ref 12.0–15.0)
Potassium: 3.7 mmol/L (ref 3.5–5.1)
Sodium: 143 mmol/L (ref 135–145)
TCO2: 19 mmol/L — ABNORMAL LOW (ref 22–32)

## 2020-05-28 LAB — CBC
HCT: 39.9 % (ref 36.0–46.0)
Hemoglobin: 11.8 g/dL — ABNORMAL LOW (ref 12.0–15.0)
MCH: 28.2 pg (ref 26.0–34.0)
MCHC: 29.6 g/dL — ABNORMAL LOW (ref 30.0–36.0)
MCV: 95.5 fL (ref 80.0–100.0)
Platelets: 338 10*3/uL (ref 150–400)
RBC: 4.18 MIL/uL (ref 3.87–5.11)
RDW: 15.6 % — ABNORMAL HIGH (ref 11.5–15.5)
WBC: 20.7 10*3/uL — ABNORMAL HIGH (ref 4.0–10.5)
nRBC: 0 % (ref 0.0–0.2)

## 2020-05-28 LAB — DIFFERENTIAL
Abs Immature Granulocytes: 0.16 10*3/uL — ABNORMAL HIGH (ref 0.00–0.07)
Basophils Absolute: 0 10*3/uL (ref 0.0–0.1)
Basophils Relative: 0 %
Eosinophils Absolute: 0.2 10*3/uL (ref 0.0–0.5)
Eosinophils Relative: 1 %
Immature Granulocytes: 1 %
Lymphocytes Relative: 23 %
Lymphs Abs: 4.7 10*3/uL — ABNORMAL HIGH (ref 0.7–4.0)
Monocytes Absolute: 0.9 10*3/uL (ref 0.1–1.0)
Monocytes Relative: 4 %
Neutro Abs: 14.6 10*3/uL — ABNORMAL HIGH (ref 1.7–7.7)
Neutrophils Relative %: 71 %

## 2020-05-28 LAB — ETHANOL: Alcohol, Ethyl (B): <10 mg/dL (ref <10)

## 2020-05-28 LAB — APTT: aPTT: 28 seconds (ref 24–36)

## 2020-05-28 LAB — PROTIME-INR
INR: 1.1 (ref 0.8–1.2)
Prothrombin Time: 14.1 seconds (ref 11.4–15.2)

## 2020-05-28 LAB — TSH: TSH: 3.971 u[IU]/mL (ref 0.350–4.500)

## 2020-05-28 LAB — SARS CORONAVIRUS 2 BY RT PCR (HOSPITAL ORDER, PERFORMED IN ~~LOC~~ HOSPITAL LAB): SARS Coronavirus 2: NEGATIVE

## 2020-05-28 LAB — CBG MONITORING, ED: Glucose-Capillary: 183 mg/dL — ABNORMAL HIGH (ref 70–99)

## 2020-05-28 MED ORDER — ACETAMINOPHEN 500 MG PO TABS: 1000.0000 mg | ORAL_TABLET | Freq: Four times a day (QID) | ORAL | Status: DC | PRN

## 2020-05-28 MED ORDER — TRAZODONE HCL 50 MG PO TABS
25.0000 mg | ORAL_TABLET | Freq: Every evening | ORAL | Status: DC
  Administered 2020-05-30 – 2020-06-01 (×3): 25 mg via ORAL
  Filled 2020-05-28 (×4): qty 1

## 2020-05-28 MED ORDER — HYDROCODONE-ACETAMINOPHEN 5-325 MG PO TABS: 1.0000 | ORAL_TABLET | ORAL | Status: DC

## 2020-05-28 MED ORDER — DULOXETINE HCL 60 MG PO CPEP
60.0000 mg | ORAL_CAPSULE | Freq: Every day | ORAL | Status: DC
  Administered 2020-05-31 – 2020-06-01 (×2): 60 mg via ORAL
  Filled 2020-05-28 (×3): qty 1

## 2020-05-28 MED ORDER — PANTOPRAZOLE SODIUM 40 MG PO TBEC: 40.0000 mg | DELAYED_RELEASE_TABLET | Freq: Every day | ORAL | Status: DC

## 2020-05-28 MED ORDER — CALCIUM CARBONATE 1250 (500 CA) MG PO TABS
1.0000 | ORAL_TABLET | Freq: Every day | ORAL | Status: DC
  Administered 2020-05-31 – 2020-06-01 (×2): 500 mg via ORAL
  Filled 2020-05-28 (×2): qty 1

## 2020-05-28 MED ORDER — LEVETIRACETAM IN NACL 1500 MG/100ML IV SOLN
1500.0000 mg | Freq: Once | INTRAVENOUS | Status: AC
  Administered 2020-05-28: 1500 mg via INTRAVENOUS
  Filled 2020-05-28: qty 100

## 2020-05-28 MED ORDER — PANTOPRAZOLE SODIUM 40 MG IV SOLR
40.0000 mg | INTRAVENOUS | Status: DC
  Administered 2020-05-29 – 2020-05-31 (×4): 40 mg via INTRAVENOUS
  Filled 2020-05-28 (×4): qty 40

## 2020-05-28 MED ORDER — ACETAMINOPHEN 10 MG/ML IV SOLN
1000.0000 mg | Freq: Four times a day (QID) | INTRAVENOUS | Status: AC | PRN
  Administered 2020-05-28: 1000 mg via INTRAVENOUS
  Filled 2020-05-28: qty 100

## 2020-05-28 MED ORDER — ENOXAPARIN SODIUM 40 MG/0.4ML ~~LOC~~ SOLN
40.0000 mg | SUBCUTANEOUS | Status: DC
  Administered 2020-05-29 – 2020-05-30 (×3): 40 mg via SUBCUTANEOUS
  Filled 2020-05-28 (×3): qty 0.4

## 2020-05-28 MED ORDER — LORAZEPAM 2 MG/ML IJ SOLN
INTRAMUSCULAR | Status: AC
  Administered 2020-05-28: 1 mg
  Filled 2020-05-28: qty 1

## 2020-05-28 MED ORDER — DONEPEZIL HCL 10 MG PO TABS
5.0000 mg | ORAL_TABLET | Freq: Every day | ORAL | Status: DC
  Administered 2020-05-30 – 2020-05-31 (×2): 5 mg via ORAL
  Filled 2020-05-28 (×4): qty 1

## 2020-05-28 MED ORDER — DIGOXIN 0.25 MG/ML IJ SOLN
0.1250 mg | INTRAMUSCULAR | Status: DC
  Administered 2020-05-29 – 2020-06-01 (×3): 0.125 mg via INTRAVENOUS
  Filled 2020-05-28: qty 2
  Filled 2020-05-28 (×2): qty 0.5
  Filled 2020-05-28: qty 2

## 2020-05-28 MED ORDER — LORAZEPAM 2 MG/ML IJ SOLN
1.0000 mg | Freq: Once | INTRAMUSCULAR | Status: AC
  Filled 2020-05-28: qty 1

## 2020-05-28 MED ORDER — DIGOXIN 125 MCG PO TABS: 0.1250 mg | ORAL_TABLET | ORAL | Status: DC

## 2020-05-28 MED ORDER — LATANOPROST 0.005 % OP SOLN
1.0000 [drp] | Freq: Every day | OPHTHALMIC | Status: DC
  Administered 2020-05-30 – 2020-05-31 (×2): 1 [drp] via OPHTHALMIC
  Filled 2020-05-28: qty 2.5

## 2020-05-28 MED ORDER — ENSURE ENLIVE PO LIQD
237.0000 mL | Freq: Two times a day (BID) | ORAL | Status: DC
  Filled 2020-05-28: qty 237

## 2020-05-28 MED ORDER — CALCITONIN (SALMON) 200 UNIT/ACT NA SOLN
1.0000 | Freq: Every day | NASAL | Status: DC
  Administered 2020-05-31 – 2020-06-01 (×2): 1 via NASAL
  Filled 2020-05-28: qty 3.7

## 2020-05-28 MED ORDER — TIMOLOL MALEATE 0.5 % OP SOLN
1.0000 [drp] | Freq: Every day | OPHTHALMIC | Status: DC
  Administered 2020-05-29 – 2020-06-01 (×3): 1 [drp] via OPHTHALMIC
  Filled 2020-05-28: qty 5

## 2020-05-28 MED ORDER — VITAMIN D 25 MCG (1000 UNIT) PO TABS
1000.0000 [IU] | ORAL_TABLET | Freq: Every day | ORAL | Status: DC
  Administered 2020-05-31 – 2020-06-01 (×2): 1000 [IU] via ORAL
  Filled 2020-05-28 (×2): qty 1

## 2020-05-28 MED ORDER — LEVETIRACETAM IN NACL 500 MG/100ML IV SOLN
500.0000 mg | Freq: Two times a day (BID) | INTRAVENOUS | Status: DC
  Administered 2020-05-28 – 2020-05-31 (×6): 500 mg via INTRAVENOUS
  Filled 2020-05-28 (×7): qty 100

## 2020-05-28 MED ORDER — PREGABALIN 50 MG PO CAPS
50.0000 mg | ORAL_CAPSULE | Freq: Two times a day (BID) | ORAL | Status: DC
  Administered 2020-05-30 – 2020-06-01 (×4): 50 mg via ORAL
  Filled 2020-05-28 (×5): qty 1

## 2020-05-28 NOTE — ED Notes (Signed)
After giving ativan pt is calm enough for vital signs. Does not continue to scream. Pt resting comfortably Will continue to monitor

## 2020-05-28 NOTE — ED Provider Notes (Signed)
[facility] EMERGENCY DEPARTMENT Provider Note   CSN: [id number] Arrival date & time: 05/28/20  1437     History Chief Complaint  Patient presents with  . Code Stroke    Naylah Cork is a 86 y.o. female.  86yo F w/ PMH including A fib, hypertension, hyperlipidemia who presents with altered mental status and possible seizure.  Nursing facility reported to EMS that they were with the patient this afternoon when she suddenly had a generalized tonic-clonic seizure lasting approximately 1 minute.  It stopped but then shortly afterwards it happened again for about 30 seconds.  When EMS arrived, patient was postictal with no apparent seizure activity.  They did note leftward gaze deviation and left-sided hemiparesis.  She has had no seizure activity in route. DNR papers at bedside.   LEVEL 5 CAVEAT DUE TO AMS  The history is provided by the nursing home and the EMS personnel.       Past Medical History:  Diagnosis Date  . Depression   . Glaucoma   . Hyperlipidemia   . Hypertension   . Osteoporosis     Patient Active Problem List   Diagnosis Date Noted  . Weakness generalized   . GI bleed 08/30/2019  . UTI (urinary tract infection) 08/30/2019  . Hypokalemia 08/30/2019  . Demand ischemia (HCC) 08/30/2019  . Palliative care by specialist   . COVID-19   . DNR (do not resuscitate)   . Adult failure to thrive   . Left acetabular fracture (HCC) 08/23/2019  . Pelvic rami fracture (HCC) 08/23/2019  . PAF (paroxysmal atrial fibrillation) (HCC) 08/23/2019  . Rib fracture-12 01/12/2019  . Fracture of transverse process of lumbar vertebra L1-L2 01/12/2019  . Dementia (HCC) 01/12/2019  . Fall 01/12/2019  . Leukocytosis 01/12/2019  . Depression   . Hypertension     Past Surgical History:  Procedure Laterality Date  . ABDOMINAL HYSTERECTOMY    . ANKLE ARTHROSCOPY    . CHOLECYSTECTOMY    . EYE SURGERY    . RECTOCELE REPAIR       OB History   No  obstetric history on file.     No family history on file.  Social History   Tobacco Use  . Smoking status: Never Smoker  . Smokeless tobacco: Never Used  Vaping Use  . Vaping Use: Never used  Substance Use Topics  . Alcohol use: No  . Drug use: No    Home Medications Prior to Admission medications   Medication Sig Start Date End Date Taking? Authorizing Provider  acetaminophen (TYLENOL) 500 MG tablet Take 1,000 mg by mouth every 6 (six) hours as needed for mild pain.    [provider]  calcium carbonate (OSCAL) 1500 (600 Ca) MG TABS tablet Take 600 mg of elemental calcium by mouth daily with breakfast.    [provider]  cholecalciferol (VITAMIN D3) 25 MCG (1000 UT) tablet Take 1,000 Units by mouth daily with breakfast.     [provider]  digoxin (LANOXIN) 0.125 MG tablet Take 0.125 mg by mouth every other day.     [provider]  donepezil (ARICEPT) 5 MG tablet Take 5 mg by mouth at bedtime. 10/15/18   [provider]  DULoxetine (CYMBALTA) 60 MG capsule Take 60 mg by mouth daily with breakfast.  05/22/14   [provider]  feeding supplement, ENSURE ENLIVE, (ENSURE ENLIVE) LIQD Take 237 mLs by mouth 2 (two) times daily between meals. 09/01/19   Arrien, Mauricio  Reuel Boom, MD  latanoprost (XALATAN) 0.005 % ophthalmic solution Place 1 drop into both eyes at bedtime.    [provider]  pantoprazole (PROTONIX) 40 MG tablet Take 1 tablet (40 mg total) by mouth daily. 09/02/19 10/02/19  Arrien, York Ram, MD  timolol (TIMOPTIC) 0.5 % ophthalmic solution Place 1 drop into both eyes daily. 06/15/14   [provider]  traMADol (ULTRAM) 50 MG tablet Take 1 tablet (50 mg total) by mouth every 8 (eight) hours as needed for severe pain. 09/01/19   Arrien, York Ram, MD  traZODone (DESYREL) 50 MG tablet Take 50 mg by mouth at bedtime. 10/15/18   [provider]    Allergies    Ciprofloxacin hcl, Effexor  [venlafaxine], Gantrisin [sulfisoxazole], Levaquin [levofloxacin], and Sulfa antibiotics  Review of Systems   Review of Systems  Unable to perform ROS: Mental status change    Physical Exam Updated Vital Signs There were no vitals taken for this visit.  Physical Exam Vitals and nursing note reviewed.  Constitutional:      General: She is not in acute distress.    Appearance: She is well-developed.     Comments: Awake, non-verbal, not responding to questions  HENT:     Head: Normocephalic and atraumatic.  Eyes:     Conjunctiva/sclera: Conjunctivae normal.     Pupils: Pupils are equal, round, and reactive to light.     Comments: Leftward gaze deviation  Cardiovascular:     Rate and Rhythm: Regular rhythm. Tachycardia present.     Heart sounds: Normal heart sounds. No murmur heard.   Pulmonary:     Effort: Pulmonary effort is normal.     Breath sounds: Normal breath sounds.  Abdominal:     General: There is no distension.     Palpations: Abdomen is soft.     Tenderness: There is no abdominal tenderness.  Musculoskeletal:     Cervical back: Neck supple.  Skin:    General: Skin is warm and dry.  Neurological:     Comments: Non-verbal, not following commands, leftward gaze deviation and won't cross midline; L arm flaccid, will move b/l feet and R leg but no spontaneous movement of L leg; no clonus     ED Results / Procedures / Treatments   Labs (all labs ordered are listed, but only abnormal results are displayed) Labs Reviewed  ETHANOL  PROTIME-INR  APTT  CBC  DIFFERENTIAL  COMPREHENSIVE METABOLIC PANEL  RAPID URINE DRUG SCREEN, HOSP PERFORMED  URINALYSIS, ROUTINE W REFLEX MICROSCOPIC  TSH  I-STAT CHEM 8, ED    EKG None  Radiology No results found.  Procedures .Critical Care Performed by: Laurence Spates, MD Authorized by: Laurence Spates, MD   Critical care provider statement:    Critical care time (minutes):  30   Critical care time was  exclusive of:  Separately billable procedures and treating other patients   Critical care was necessary to treat or prevent imminent or life-threatening deterioration of the following conditions:  CNS failure or compromise   Critical care was time spent personally by me on the following activities:  Development of treatment plan with patient or surrogate, discussions with consultants, evaluation of patient's response to treatment, examination of patient, obtaining history from patient or surrogate, ordering and review of laboratory studies, ordering and performing treatments and interventions, ordering and review of radiographic studies, re-evaluation of patient's condition and review of old charts   (including critical care time)  Medications Ordered in ED Medications  LORazepam (ATIVAN) 2 MG/ML injection (has no administration in time range)    ED Course  I have reviewed the triage vital signs and the nursing notes.  Pertinent labs & imaging results that were available during my care of the patient were reviewed by me and considered in my medical decision making (see chart for details).    MDM Rules/Calculators/A&P                          Pt arrived w/ L sided weakness and L gaze deviation. EMS called at 1:50pm for seizure activity. Activated code stroke. Shortly thereafter, pt had generalized seizure during which she drew up b/l upper extremities and had muscle rigidity. Resolved after 1-2 min. Gave 1mg  IV ativan.  pt taken to CT scanner. Evaluated by Dr. , neurology. Labs show WBC 20, Cr 1.1. Glucose 194.  I have ordered keppra due to concern for status epilepticus.  Patient signed out pending completion of work-up and neurology evaluation. Final Clinical Impression(s) / ED Diagnoses Final diagnoses:  None    Rx / DC Orders ED Discharge Orders    None       Seila Liston, Laurence Slate, MD 05/28/20 1511

## 2020-05-28 NOTE — Procedures (Signed)
Patient Name: Kendra Smith  MRN: [id number]  Epilepsy Attending: Charlsie Quest  Referring Physician/Provider: Felicie Morn, PA Date: 05/28/2020  Duration: 20.46 mins  Patient history: 86 year old female presenting to [facility] emergency department secondary to tonic-clonic seizures while at nursing home. Another further seizure that was initially partial with secondary generalization while in the ED. EEG to evaluate for seizure.   Level of alertness: lethargic  AEDs during EEG study: LEV  Technical aspects: This EEG study was done with scalp electrodes positioned according to the 10-20 International system of electrode placement. Electrical activity was acquired at a sampling rate of 500Hz  and reviewed with a high frequency filter of 70Hz  and a low frequency filter of 1Hz . EEG data were recorded continuously and digitally stored.   Description: EEG showed continuous/intermittent generalized 3 to 6 Hz theta-delta slowing.  Hyperventilation and photic stimulation were not performed.     EEG was technically difficult due to significant movement artifact.  ABNORMALITY -Continuous slow, generalized  IMPRESSION: This technically difficult study is suggestive of severe diffuse encephalopathy, nonspecific etiology.  No seizures or epileptiform discharges were seen throughout the recording.   Haze Antillon 

## 2020-05-28 NOTE — ED Provider Notes (Signed)
Assumed care of patient at 3 PM.  New seizure activity.  Possible stroke on CT scan.  Will need MRI.  Neurology has seen the patient due to left-sided weakness and left-sided gaze.  Patient is DNR.  Has been given Keppra, Ativan.  Multiple seizures today.  Does have dementia.  EEG does not show any signs of status epilepticus.  I have confirmed DNR/DNI with family.  Will likely transition to comfort care if patient does develop status.  Seizures likely from possible stroke versus possible infectious process.  Chest x-ray and urine have been ordered.  Will admit to medicine for further care.  Patient still not at baseline slightly agitated.  However vital signs are normal.  This chart was dictated using voice recognition software.  Despite best efforts to proofread,  errors can occur which can change the documentation meaning.     Kendra Norfolk, DO 05/28/20 1626

## 2020-05-28 NOTE — Progress Notes (Signed)
Nights 05/28/20: Failed bedside swallow study.  Ordered SLP swallow eval, changed meds from PO to IV where possible.

## 2020-05-28 NOTE — Consult Note (Addendum)
.Neurology Consultation  Reason for Consult: Left-sided weakness Referring Physician: Virgina Norfolk, DO  CC: Todd's paralysis secondary to seizure  History is obtained from: Emergency department physicians  HPI: Seaira Byus is a 86 y.o. female with past medical history of atrial fibrillation, hypertension, hyperlipidemia who presented to the emergency department with altered mental status after 2 witnessed seizures at nursing home. Nursing facility reported the patient had sudden onset of general tonic-clonic seizure last approximately 1 minute. The seizure stopped but then shortly afterwards it occurred again for approximately 30 seconds. On arrival EMS found patient postictal with gaze to the left and left sided hemiparesis. While in the ED patient had second seizure that was partial complex with secondary generalization. Patient was administered 1.5 g of Keppra and currently is postictal.  Per daughter patient has not had a history of seizures in the past.   LKW: 1350 tpa given?: no, seizure and not stroke Premorbid modified Rankin scale (mRS): 3 NIH stroke scale score: 33   Past Medical History:  Diagnosis Date  . Depression   . Glaucoma   . Hyperlipidemia   . Hypertension   . Osteoporosis     family history unable to obtain at this time.  Social History:   reports that she has never smoked. She has never used smokeless tobacco. She reports that she does not drink alcohol and does not use drugs.  Medications  Current Facility-Administered Medications:  .  levETIRAcetam (KEPPRA) IVPB 1500 mg/ 100 mL premix, 1,500 mg, Intravenous, Once, Little, Ambrose Finland, MD  Current Outpatient Medications:  .  calcitonin, salmon, (MIACALCIN/FORTICAL) 200 UNIT/ACT nasal spray, Place 1 spray into alternate nostrils daily., Disp: , Rfl:  .  calcium carbonate (OSCAL) 1500 (600 Ca) MG TABS tablet, Take 600 mg of elemental calcium by mouth daily with breakfast., Disp: , Rfl:  .   cholecalciferol (VITAMIN D3) 25 MCG (1000 UT) tablet, Take 1,000 Units by mouth daily with breakfast. , Disp: , Rfl:  .  pantoprazole (PROTONIX) 40 MG tablet, Take 1 tablet (40 mg total) by mouth daily., Disp: 30 tablet, Rfl: 0 .  traZODone (DESYREL) 50 MG tablet, Take 50 mg by mouth at bedtime., Disp: , Rfl:  .  acetaminophen (TYLENOL) 500 MG tablet, Take 1,000 mg by mouth every 6 (six) hours as needed for mild pain., Disp: , Rfl:  .  digoxin (LANOXIN) 0.125 MG tablet, Take 0.125 mg by mouth every other day. , Disp: , Rfl:  .  donepezil (ARICEPT) 5 MG tablet, Take 5 mg by mouth at bedtime., Disp: , Rfl:  .  DULoxetine (CYMBALTA) 60 MG capsule, Take 60 mg by mouth daily with breakfast. , Disp: , Rfl:  .  feeding supplement, ENSURE ENLIVE, (ENSURE ENLIVE) LIQD, Take 237 mLs by mouth 2 (two) times daily between meals., Disp: 237 mL, Rfl: 12 .  latanoprost (XALATAN) 0.005 % ophthalmic solution, Place 1 drop into both eyes at bedtime., Disp: , Rfl:  .  timolol (TIMOPTIC) 0.5 % ophthalmic solution, Place 1 drop into both eyes daily., Disp: , Rfl:  .  traMADol (ULTRAM) 50 MG tablet, Take 1 tablet (50 mg total) by mouth every 8 (eight) hours as needed for severe pain., Disp: 10 tablet, Rfl: 0  ROS:  Unable to obtain due to altered mental status.   Exam: Current vital signs: BP 133/82   Resp (!) 22  Vital signs in last 24 hours: Resp:  [18-22] 22 (07/19 1500) BP: (100-133)/(82-88) 133/82 (07/19 1500)   Constitutional: Appears  well-developed and well-nourished.  Eyes: No scleral injection HENT: No OP obstrucion Head: Normocephalic.  Cardiovascular: Normal rate and regular rhythm.  Respiratory: Rhonchorous GI: Soft.  No distension. There is no tenderness.  Skin: WDI  Neuro: Mental Status: Patient does not respond to verbal stimuli.  Does not respond to deep sternal rub.  Does not follow commands.  No verbalizations noted. Rhonchorous deep breathing Cranial Nerves: II: patient does not  respond confrontation bilaterally,  III,IV,VI: Eyes were roving. pupils right 2 mm, left 2 mm,and reactive bilaterally V,VII: corneal reflex present bilaterally  VIII: patient does not respond to verbal stimuli XII: tongue strength unable to test Motor: Extremities flaccid throughout.  No spontaneous movement noted.  No purposeful movements noted. Sensory: Does not respond to noxious stimuli in any extremity. Deep Tendon Reflexes:  2+ bilateral upper extremities and knee jerk Plantars: Mute bilaterally Cerebellar: Unable to perform   Labs I have reviewed labs in epic and the results pertinent to this consultation are:   CBC    Component Value Date/Time   WBC 20.7 (H) 05/28/2020 1445   RBC 4.18 05/28/2020 1445   HGB 12.9 05/28/2020 1451   HCT 38.0 05/28/2020 1451   PLT 338 05/28/2020 1445   MCV 95.5 05/28/2020 1445   MCH 28.2 05/28/2020 1445   MCHC 29.6 (L) 05/28/2020 1445   RDW 15.6 (H) 05/28/2020 1445   LYMPHSABS 4.7 (H) 05/28/2020 1445   MONOABS 0.9 05/28/2020 1445   EOSABS 0.2 05/28/2020 1445   BASOSABS 0.0 05/28/2020 1445    CMP     Component Value Date/Time   NA 143 05/28/2020 1451   K 3.7 05/28/2020 1451   CL 104 05/28/2020 1451   CO2 25 08/31/2019 0510   GLUCOSE 194 (H) 05/28/2020 1451   BUN 12 05/28/2020 1451   CREATININE 1.10 (H) 05/28/2020 1451   CALCIUM 8.5 (L) 08/31/2019 0510   PROT 6.5 08/31/2019 0510   ALBUMIN 2.5 (L) 08/31/2019 0510   AST 15 08/31/2019 0510   ALT 16 08/31/2019 0510   ALKPHOS 66 08/31/2019 0510   BILITOT 0.4 08/31/2019 0510   GFRNONAA >60 08/31/2019 0510   GFRAA >60 08/31/2019 0510    Lipid Panel  No results found for: CHOL, TRIG, HDL, CHOLHDL, VLDL, LDLCALC, LDLDIRECT   Imaging I have reviewed the images obtained:  CT-scan of the brain-atrophy and chronic small vessel ischemic changes. Question hyper dense right MCA branch in the sylvian fissure.   Felicie Morn PA-C [facility] [phone]  M-F  (9:00  am- 5:00 PM)  05/28/2020, 3:15 PM     Assessment:  86 year old female presenting to [facility] emergency department secondary to tonic-clonic seizures while at nursing home. Another further seizure that was initially partial with secondary generalization while in the ED. No history of seizures per family. Patient was given 1.5 g Keppra load and EEG immediately ordered.  While patient may have had a stroke, not a candidate for TPA due to relative contraindication of seizure on presentation and poor functional baseline with advanced dementia and wheelchair-bound (MRS 4) and therefore not a candidate for thrombectomy.  Impression: -Status epilepticus  -Left hemiparesis secondary to postictal Todd's paresis versus acute stroke  Recommendations: -Seizure precautions -500 mg Keppra twice daily -stat EEG: Negative for seizures -Continue to watch airway -MRI brain when capable -Check for electrolyte and metabolic disturbances, UTI and other infections   NEUROHOSPITALIST ADDENDUM Performed a face to face diagnostic evaluation.   I have reviewed the contents of history and physical  exam as documented by PA/ARNP/Resident and agree with above documentation.  I have discussed and formulated the above plan as documented. Edits to the note have been made as needed.  86 year old female with atrial fibrillation not on anticoagulation, hypertension, hyperlipidemia presents to the ED after having 2 witnessed seizures in the nursing home.  In the ED she was noted to have left side paralysis and left gaze.  Code stroke was initiated by EDP for left-sided weakness.  On assessment, patient did not have any forced gaze with continue to remain lethargic and weak at the left side.  This patient has forced gaze deviation towards the sided weakness, seizure with primary differential and stat EEG was obtained to rule out nonconvulsive status epilepticus.  CT head did show M2 hyperdensity, however patient not  candidate for thrombectomy even if this were to represent an acute clot due to patient's poor functional baseline.  Recommendations as above for seizure management.     Georgiana Spinner Laurence Folz MD [facility] [phone]   If 7pm to 7am, please call on call as listed on AMION.

## 2020-05-28 NOTE — Code Documentation (Signed)
Stroke Response Nurse Documentation Code Documentation  Taffy Delconte is a 86 y.o. female arriving to [facility] ED via [facility] on 05/28/20 with past medical hx of hypertension, hyperlipidemia, depression, dementia, paroxysmal atrial fibrillation, glaucoma, and iron deficiency anemia. Code stroke was activated by ED. Patient from [facility] where she was LKW at 1315 and now complaining of altered mental status status post seizure. Patient presented to ED with left sided weakness and left gaze preference.   Stroke team at the bedside on patient arrival. Labs drawn and patient cleared for CT by Dr. Clarene Duke. Patient to CT with team. NIHSS 33, see documentation for details and code stroke times. Patient with decreased LOC, disoriented, not following commands, bilateral hemianopia, bilateral arm weakness, bilateral leg weakness, bilateral decreased sensation, Global aphasia , dysarthria  and Visual  neglect on exam. Noted to have roving eyes during post CT exam.   The following imaging was completed: CT. Patient is not a candidate for tPA due to stroke not suspected. Care/Plan EEG and Q2 VS with neuro checks. Bedside handoff with ED RN Aggie Cosier.    Ferman Hamming Stroke Response RN

## 2020-05-28 NOTE — H&P (Signed)
History and Physical    Kendra Smith RKY:[id number] DOB: 1926/06/05 DOA: 05/28/2020  I have briefly reviewed the patient's prior medical records in [facility]  PCP: Patient, No Pcp Per  Patient coming from: SNF  Chief Complaint: seizure activity  HPI: Kendra Smith is a 86 y.o. female with medical history significant of dementia, hypertension, hyperlipidemia, paroxysmal A. fib, came into the hospital brought by her nursing home due to seizure-like activity.  Patient is currently agitated and with underlying dementia cannot contribute to the story, daughter is at bedside but she was not present at the nursing home and just found out that she was brought here.  History is per EDP, the nursing facility reported that patient had sudden onset of generalized tonic-clonic seizure lasting approximately a minute.  It broke away on its own however had recurrent seizure that lasted about 30 seconds.  EMS arrived and patient was found to be postictal with left-sided hemiparesis and gaze to the left.  She had a third seizure in the ED.  Neurology has been consulted and she was loaded with Keppra.  There is no reported history of seizures.  Patient's daughter denies any knowledge of fever, chills, or any other issues up until today's events.  She last visited her mother about 2 days ago and everything seems to be at her baseline  ED Course: In the emergency room she is afebrile at 6.4, heart rate between 80 and 120, blood pressure is normal and she is satting well on room air.  Her blood work reveals creatinine 1.1, also WBC of 20.7.  Chest x-ray and urinalysis pending.  We are asked to admit.  Review of Systems: Unable to obtain review of systems due to underlying dementia and agitation  Past Medical History:  Diagnosis Date  . Depression   . Glaucoma   . Hyperlipidemia   . Hypertension   . Osteoporosis     Past Surgical History:  Procedure Laterality Date  . ABDOMINAL HYSTERECTOMY    .  ANKLE ARTHROSCOPY    . CHOLECYSTECTOMY    . EYE SURGERY    . RECTOCELE REPAIR       reports that she has never smoked. She has never used smokeless tobacco. She reports that she does not drink alcohol and does not use drugs.  Allergies  Allergen Reactions  . Ciprofloxacin Hcl Other (See Comments)    On MAR  . Effexor [Venlafaxine] Other (See Comments)    On MAR  . Gantrisin [Sulfisoxazole] Other (See Comments)    On MAR  . Levaquin [Levofloxacin] Other (See Comments)    On MAR  . Sulfa Antibiotics Rash    Family history reviewed and noncontributory  Prior to Admission medications   Medication Sig Start Date End Date Taking? Authorizing Provider  acetaminophen (TYLENOL) 500 MG tablet Take 1,000 mg by mouth every 6 (six) hours as needed for mild pain, fever or headache.    Yes [provider]  calcitonin, salmon, (MIACALCIN/FORTICAL) 200 UNIT/ACT nasal spray Place 1 spray into alternate nostrils daily.   Yes [provider]  calcium carbonate (OSCAL) 1500 (600 Ca) MG TABS tablet Take 600 mg of elemental calcium by mouth daily with breakfast.   Yes [provider]  cholecalciferol (VITAMIN D3) 25 MCG (1000 UT) tablet Take 1,000 Units by mouth daily with breakfast.    Yes [provider]  digoxin (LANOXIN) 0.125 MG tablet Take 0.125 mg by mouth every other day.    Yes [provider]  donepezil (ARICEPT) 5 MG tablet Take 5 mg by mouth at bedtime. 10/15/18  Yes [provider]  DULoxetine (CYMBALTA) 60 MG capsule Take 60 mg by mouth daily with breakfast.  05/22/14  Yes [provider]  HYDROcodone-acetaminophen (NORCO/VICODIN) 5-325 MG tablet Take 1 tablet by mouth See admin instructions. Take 1 tablet by mouth two times a day (9 AM and 9 PM) and every 12 hours as needed for pain   Yes [provider]  Infant Care Products [facility](DERMACLOUD EX) Apply 1 application topically See admin instructions. Dermacloud ointment- Apply  to buttocks after each incontinent episode   Yes [provider]  latanoprost (XALATAN) 0.005 % ophthalmic solution Place 1 drop into both eyes at bedtime.   Yes [provider]  NON FORMULARY Take 120 mLs by mouth See admin instructions. MedPass- Drink 120 ml's by mouth two times a day   Yes [provider]  pantoprazole (PROTONIX) 40 MG tablet Take 1 tablet (40 mg total) by mouth daily. 09/02/19 05/28/20 Yes Arrien, York RamMauricio Daniel, MD  pregabalin (LYRICA) 50 MG capsule Take 50 mg by mouth 2 (two) times daily.   Yes [provider]  timolol (TIMOPTIC) 0.5 % ophthalmic solution Place 1 drop into both eyes daily. 06/15/14  Yes [provider]  traZODone (DESYREL) 50 MG tablet Take 25 mg by mouth every evening.  10/15/18  Yes [provider]  feeding supplement, ENSURE ENLIVE, (ENSURE ENLIVE) LIQD Take 237 mLs by mouth 2 (two) times daily between meals. Patient not taking: Reported on 05/28/2020 09/01/19   Arrien, York RamMauricio Daniel, MD  traMADol (ULTRAM) 50 MG tablet Take 1 tablet (50 mg total) by mouth every 8 (eight) hours as needed for severe pain. Patient not taking: Reported on 05/28/2020 09/01/19   Arrien, York RamMauricio Daniel, MD    Physical Exam: Vitals:   05/28/20 1438 05/28/20 1439 05/28/20 1500 05/28/20 1556  BP: 100/88  133/82 (!) 130/101  Resp:  18 (!) 22 (!) 24  Temp:    (!) 96.4 F (35.8 C)  TempSrc:    Axillary  SpO2:    92%    Constitutional: Agitated, pulling the sheets on top of her head Eyes: No scleral icterus ENMT: Mucous membranes are moist.  Neck: normal, supple Respiratory: clear to auscultation bilaterally, no wheezing, no crackles. Normal respiratory effort. No accessory muscle use.  Cardiovascular: Regular rate and rhythm, no murmurs / rubs / gallops. No extremity edema.  Tachycardic Abdomen: no tenderness, no masses palpated. Bowel sounds positive.  Musculoskeletal: no clubbing / cyanosis Skin: no rashes  appreciated Neurologic: Moves on her right side only, she does not follow commands  Labs on Admission: I have personally reviewed following labs and imaging studies  CBC: Recent Labs  Lab 05/28/20 1445 05/28/20 1451  WBC 20.7*  --   NEUTROABS 14.6*  --   HGB 11.8* 12.9  HCT 39.9 38.0  MCV 95.5  --   PLT 338  --    Basic Metabolic Panel: Recent Labs  Lab 05/28/20 1445 05/28/20 1451  NA 142 143  K 3.8 3.7  CL 102 104  CO2 19*  --   GLUCOSE 201* 194*  BUN 12 12  CREATININE 1.28* 1.10*  CALCIUM 9.6  --    Liver Function Tests: Recent Labs  Lab 05/28/20 1445  AST 25  ALT 13  ALKPHOS 108  BILITOT 0.9  PROT 7.6  ALBUMIN 3.1*   Coagulation Profile: Recent Labs  Lab 05/28/20 1445  INR  1.1   BNP (last 3 results) No results for input(s): PROBNP in the last 8760 hours. CBG: Recent Labs  Lab 05/28/20 1443  GLUCAP 183*   Thyroid Function Tests: Recent Labs    05/28/20 1445  TSH 3.971   Urine analysis:    Component Value Date/Time   COLORURINE YELLOW 08/29/2019 2324   APPEARANCEUR HAZY (A) 08/29/2019 2324   LABSPEC 1.024 08/29/2019 2324   PHURINE 6.0 08/29/2019 2324   GLUCOSEU NEGATIVE 08/29/2019 2324   HGBUR NEGATIVE 08/29/2019 2324   BILIRUBINUR NEGATIVE 08/29/2019 2324   KETONESUR NEGATIVE 08/29/2019 2324   PROTEINUR 30 (A) 08/29/2019 2324   UROBILINOGEN 1.0 06/22/2011 1224   NITRITE NEGATIVE 08/29/2019 2324   LEUKOCYTESUR LARGE (A) 08/29/2019 2324     Radiological Exams on Admission: EEG  Result Date: 05/28/2020 Charlsie Quest, MD     05/28/2020  4:27 PM Patient Name: Kendra Smith MRN: [id number] Epilepsy Attending: Charlsie Quest Referring Physician/Provider: Felicie Morn, PA Date: 05/28/2020 Duration: 20.46 mins Patient history: 86 year old female presenting [facility] emergency department secondary to tonic-clonic seizures while at nursing home. Another further seizure that was initially partial with secondary generalization while in  the ED. EEG to evaluate for seizure. Level of alertness: lethargic AEDs during EEG study: LEV Technical aspects: This EEG study was done with scalp electrodes positioned according to the 10-20 International system of electrode placement. Electrical activity was acquired at a sampling rate of 500Hz  and reviewed with a high frequency filter of 70Hz  and a low frequency filter of 1Hz . EEG data were recorded continuously and digitally stored. Description: EEG showed continuous/intermittent generalized 3 to 6 Hz theta-delta slowing.  Hyperventilation and photic stimulation were not performed.   EEG was technically difficult due to significant movement artifact. ABNORMALITY -Continuous slow, generalized IMPRESSION: This technically difficult study is suggestive of severe diffuse encephalopathy, nonspecific etiology.  No seizures or epileptiform discharges were seen throughout the recording.   CT HEAD CODE STROKE WO CONTRAST  Result Date: 05/28/2020 CLINICAL DATA:  Code stroke.  Left-sided weakness.  Seizure. EXAM: CT HEAD WITHOUT CONTRAST TECHNIQUE: Contiguous axial images were obtained from the base of the skull through the vertex without intravenous contrast. COMPARISON:  08/23/2019 FINDINGS: Brain: Generalized atrophy. Extensive chronic small-vessel ischemic changes of the hemispheric white matter. Old right frontal white matter infarction. No sign of acute infarction, mass lesion, hemorrhage, hydrocephalus or extra-axial collection. Vascular: Question hyperdense right MCA branches in the sylvian fissure. Skull: Negative Sinuses/Orbits: Clear except for opacification of the right division of the sphenoid sinus. Orbits negative. Other: None ASPECTS (Alberta Stroke Program Early CT Score) - Ganglionic level infarction (caudate, lentiform nuclei, internal capsule, insula, M1-M3 cortex): 7 - Supraganglionic infarction (M4-M6 cortex): 3 Total score (0-10 with 10 being normal): 10 IMPRESSION: 1. Atrophy  and chronic small-vessel ischemic changes. Question hyperdense right MCA branches in the sylvian fissure. 2. ASPECTS is 10 3. These results were communicated to Dr. Charlsie Quest at 3:08 pmon 7/19/2021by text page via the [facility] messaging system. Electronically Signed   By: Laurence Slate M.D.   On: 05/28/2020 15:09    EKG: Independently reviewed. Sinus tach  Assessment/Plan  Principal Problem Seizure-like activity-new onset today, neurology consulted and following.  She underwent a CT scan of the head code stroke due to left-sided hemiparesis with questionable hyperdense right MCA branches in the sylvian fissure.  Neurology recommended MRI however she is quite agitated currently.  Has received Keppra, continue to closely monitor in the progressive  setting.  She also received Ativan which I suspect can paradoxically make her agitated.  EEG without significant seizure-like activity.  Active Problems Leukocytosis-chest x-ray & urinalysis pending.  Leukocytosis can be related to seizures however if there would be evidence of infection will start empiric antibiotics.  Paroxysmal A. fib-patient not on anticoagulation due to suspect lower GI bleed in November 2020, also doubt she is a good candidate due to underlying dementia.  Continue digoxin.  Advanced dementia-slightly more agitated now as she just received Ativan for her seizures  History of COVID-29 August 2019-she had no symptoms with that   DVT prophylaxis: Lovenox  Code Status: DNR  Family Communication: daughter at bedside  Disposition Plan: TBD Bed Type: progressive  Consults called: neurology   Obs/Inp: inpatient  At the time of admission, it appears that the appropriate admission status for this patient is INPATIENT as it is expected that patient will require hospital care > 2 midnights. This is judged to be reasonable and necessary in order to provide the required intensity of service to ensure the patient's safety given: presenting  symptoms, initial radiographic and laboratory data and in the context of their chronic comorbidities. Together, these circumstances are felt to place patient at high at high risk for further clinical deterioration threatening life, limb, or organ.  Pamella Pert, MD, PhD [facility]  Contact via [email or web address]  05/28/2020, 4:30 PM

## 2020-05-28 NOTE — ED Notes (Addendum)
Pt agitated, movement is constant, pt it screaming "oh, ow." constantly. Pt will not be still long enough for b/p or pulse O2 to be obtained. APP, Katherina Right notified. FLACC score 10

## 2020-05-28 NOTE — Progress Notes (Signed)
STAT EEG complete - results pending. ? ?

## 2020-05-29 DIAGNOSIS — Z66 Do not resuscitate: Secondary | ICD-10-CM

## 2020-05-29 DIAGNOSIS — N3000 Acute cystitis without hematuria: Secondary | ICD-10-CM

## 2020-05-29 LAB — COMPREHENSIVE METABOLIC PANEL
ALT: 14 U/L (ref 0–44)
AST: 32 U/L (ref 15–41)
Albumin: 2.7 g/dL — ABNORMAL LOW (ref 3.5–5.0)
Alkaline Phosphatase: 86 U/L (ref 38–126)
Anion gap: 15 (ref 5–15)
BUN: 9 mg/dL (ref 8–23)
CO2: 28 mmol/L (ref 22–32)
Calcium: 8.9 mg/dL (ref 8.9–10.3)
Chloride: 97 mmol/L — ABNORMAL LOW (ref 98–111)
Creatinine, Ser: 1.05 mg/dL — ABNORMAL HIGH (ref 0.44–1.00)
GFR calc Af Amer: 54 mL/min — ABNORMAL LOW (ref >60)
GFR calc non Af Amer: 46 mL/min — ABNORMAL LOW (ref >60)
Glucose, Bld: 111 mg/dL — ABNORMAL HIGH (ref 70–99)
Potassium: 2.8 mmol/L — ABNORMAL LOW (ref 3.5–5.1)
Sodium: 140 mmol/L (ref 135–145)
Total Bilirubin: 1 mg/dL (ref 0.3–1.2)
Total Protein: 7.1 g/dL (ref 6.5–8.1)

## 2020-05-29 LAB — CBC
HCT: 34.5 % — ABNORMAL LOW (ref 36.0–46.0)
Hemoglobin: 10.8 g/dL — ABNORMAL LOW (ref 12.0–15.0)
MCH: 28.7 pg (ref 26.0–34.0)
MCHC: 31.3 g/dL (ref 30.0–36.0)
MCV: 91.8 fL (ref 80.0–100.0)
Platelets: 296 10*3/uL (ref 150–400)
RBC: 3.76 MIL/uL — ABNORMAL LOW (ref 3.87–5.11)
RDW: 15.6 % — ABNORMAL HIGH (ref 11.5–15.5)
WBC: 15 10*3/uL — ABNORMAL HIGH (ref 4.0–10.5)
nRBC: 0 % (ref 0.0–0.2)

## 2020-05-29 MED ORDER — HALOPERIDOL LACTATE 5 MG/ML IJ SOLN
1.0000 mg | Freq: Four times a day (QID) | INTRAMUSCULAR | Status: DC | PRN
  Administered 2020-05-29 – 2020-06-01 (×3): 1 mg via INTRAVENOUS
  Filled 2020-05-29 (×3): qty 1

## 2020-05-29 MED ORDER — MAGNESIUM SULFATE 2 GM/50ML IV SOLN
2.0000 g | Freq: Once | INTRAVENOUS | Status: AC
  Administered 2020-05-29: 2 g via INTRAVENOUS
  Filled 2020-05-29: qty 50

## 2020-05-29 MED ORDER — POTASSIUM CHLORIDE 10 MEQ/100ML IV SOLN
10.0000 meq | INTRAVENOUS | Status: AC
  Administered 2020-05-29 (×2): 10 meq via INTRAVENOUS
  Filled 2020-05-29 (×2): qty 100

## 2020-05-29 MED ORDER — SODIUM CHLORIDE 0.9 % IV SOLN
1.0000 g | Freq: Every day | INTRAVENOUS | Status: DC
  Administered 2020-05-30: 1 g via INTRAVENOUS
  Filled 2020-05-29 (×3): qty 10

## 2020-05-29 MED ORDER — HALOPERIDOL LACTATE 5 MG/ML IJ SOLN
2.0000 mg | Freq: Once | INTRAMUSCULAR | Status: AC
  Administered 2020-05-29: 2 mg via INTRAVENOUS
  Filled 2020-05-29: qty 1

## 2020-05-29 MED ORDER — HYDROCODONE-ACETAMINOPHEN 5-325 MG PO TABS
1.0000 | ORAL_TABLET | Freq: Two times a day (BID) | ORAL | Status: DC
  Administered 2020-05-30 – 2020-06-01 (×4): 1 via ORAL
  Filled 2020-05-29 (×5): qty 1

## 2020-05-29 MED ORDER — POTASSIUM CHLORIDE 10 MEQ/100ML IV SOLN
INTRAVENOUS | Status: AC
  Administered 2020-05-29: 10 meq via INTRAVENOUS
  Filled 2020-05-29: qty 100

## 2020-05-29 NOTE — Significant Event (Signed)
HOSPITAL MEDICINE OVERNIGHT EVENT NOTE  Notified by nursing patient continues to be extremely agitated overnight.  Patient is making it difficult to obtain vitals and is continually trying to scratch herself.  Chart reviewed.  Patient was agitated in the ED thought to have exacerbated by administration of Ativan.  UA that resulted after admission suggestive of UTI.  Will initiate IV Ceftriaxone for suspected UTI.  Will provide one time dose of 2mg  IV Haldol.  Monitor closely.   [place]

## 2020-05-29 NOTE — Progress Notes (Signed)
Called at 3:40pm. RN stated that Patient is still uncooperative and unwilling to come to MRI.

## 2020-05-29 NOTE — ED Notes (Signed)
RN called by Francee Gentile to inform RN that room is in fact dirty and not ready at this time

## 2020-05-29 NOTE — Progress Notes (Signed)
SLP Cancellation Note  Patient Details Name: Kendra Smith MRN: [id number] DOB: 1926-05-11   Cancelled treatment:        RN and family present. Pt did not sleep much last night, received sedation and is resting now. Plan to return today if schedule allows.    Royce Macadamia 05/29/2020, 9:45 AM   Breck Coons Lonell Face.Ed Nurse, children's [phone] Office [phone]

## 2020-05-29 NOTE — ED Notes (Signed)
This RN called 2W to check on room status , if room was clean. RN informed room was clean. Will transport pt.

## 2020-05-29 NOTE — Progress Notes (Addendum)
NEUROLOGY PROGRESS NOTE  Subjective: Patient at this point time is extremely lethargic only responding to noxious stimuli.  Obeys no commands and only states "ouch".  Per nurse patient was up all night long and agitated.  She received Haldol at 610 this morning which is likely causing her lethargy.  Exam: Vitals:   05/29/20 0432 05/29/20 0526  BP: (!) 131/119   Pulse: (!) 123   Resp:  20  Temp: 98.7 F (37.1 C)   SpO2:      Neuro:  Mental Status: Extremely lethargic, was given Ativan overnight which caused her to be extremely agitated now given Haldol which patient will only respond to noxious stimuli and fall back asleep Cranial Nerves: II: Keeping eyes closed but no blink to threat when eyelids opened III,IV, VI: Doll's reflex intact V,VII: Face appears symmetric VIII: hearing normal bilaterally-as she opens eyes and reacts to voice Motor: Moving all extremities with 5/5 strength especially when agitated Sensory: Responds to noxious stimuli in all 4 extremities   Medications:  Scheduled: . calcitonin (salmon)  1 spray Alternating Nares Daily  . calcium carbonate  1 tablet Oral Q breakfast  . cholecalciferol  1,000 Units Oral Q breakfast  . digoxin  0.125 mg Intravenous QODAY  . donepezil  5 mg Oral QHS  . DULoxetine  60 mg Oral Q breakfast  . enoxaparin (LOVENOX) injection  40 mg Subcutaneous Q24H  . feeding supplement (ENSURE ENLIVE)  237 mL Oral BID BM  . HYDROcodone-acetaminophen  1 tablet Oral BID  . latanoprost  1 drop Both Eyes QHS  . pantoprazole (PROTONIX) IV  40 mg Intravenous Q24H  . pregabalin  50 mg Oral BID  . timolol  1 drop Both Eyes Daily  . traZODone  25 mg Oral QPM   Continuous: . acetaminophen Stopped (05/29/20 0023)  . cefTRIAXone (ROCEPHIN)  IV 200 mL/hr at 05/29/20 0909  . levETIRAcetam Stopped (05/28/20 2156)   YQM:[id number]  Pertinent Labs/Diagnostics:  EEG  Result Date: 05/28/2020 Charlsie Quest, MD     05/28/2020  4:27 PM  Patient Name: Kendra Smith MRN: [id number] Epilepsy Attending: Charlsie Quest Referring Physician/Provider: Felicie Morn, PA Date: 05/28/2020 Duration: 20.46 mins Patient history: 86 year old female presenting to [facility] emergency department secondary to tonic-clonic seizures while at nursing home. Another further seizure that was initially partial with secondary generalization while in the ED. EEG to evaluate for seizure. Level of alertness: lethargic AEDs during EEG study: LEV Technical aspects: This EEG study was done with scalp electrodes positioned according to the 10-20 International system of electrode placement. Electrical activity was acquired at a sampling rate of 500Hz  and reviewed with a high frequency filter of 70Hz  and a low frequency filter of 1Hz . EEG data were recorded continuously and digitally stored. Description: EEG showed continuous/intermittent generalized 3 to 6 Hz theta-delta slowing.  Hyperventilation and photic stimulation were not performed.   EEG was technically difficult due to significant movement artifact. ABNORMALITY -Continuous slow, generalized IMPRESSION: This technically difficult study is suggestive of severe diffuse encephalopathy, nonspecific etiology.  No seizures or epileptiform discharges were seen throughout the recording.     CT HEAD CODE STROKE WO CONTRAST  Result Date: 05/28/2020 CLINICAL DATA:  Code stroke.  Left-sided weakness.  Seizure. EXAM: CT HEAD WITHOUT CONTRAST TECHNIQUE: Contiguous axial images were obtained from the base of the skull through the vertex without intravenous contrast. COMPARISON:  08/23/2019 FINDINGS: Brain: Generalized atrophy. Extensive chronic small-vessel ischemic changes of the hemispheric white  matter. Old right frontal white matter infarction. No sign of acute infarction, mass lesion, hemorrhage, hydrocephalus or extra-axial collection. Vascular: Question hyperdense right MCA branches in the sylvian fissure.  Skull: Negative Sinuses/Orbits: Clear except for opacification of the right division of the sphenoid sinus. Orbits negative. Other: None ASPECTS (Alberta Stroke Program Early CT Score) - Ganglionic level infarction (caudate, lentiform nuclei, internal capsule, insula, M1-M3 cortex): 7 - Supraganglionic infarction (M4-M6 cortex): 3 Total score (0-10 with 10 being normal): 10 IMPRESSION: 1. Atrophy and chronic small-vessel ischemic changes. Question hyperdense right MCA branches in the sylvian fissure. 2. ASPECTS is 10 3. These results were communicated to Dr. Laurence Slate at 3:08 pmon 7/19/2021by text page via the [facility] messaging system. Electronically Signed   By: Paulina Fusi M.D.   On: 05/28/2020 15:09     Felicie Morn PA-C [facility] [phone]  Assessment:  86 year old female with atrial fibrillation not on anticoagulation, hypertension, hyperlipidemia who presented to the ED after having 2 witnessed seizures in the nursing home.  In the ED she was noted to have left side paralysis and left gaze.  Code stroke was initiated by EDP for left-sided weakness  On initial assessment, patient did have any forced gaze with continue to remain lethargic and weak at the left side.  This patient has forced gaze deviation towards the sided weakness, seizure with primary differential and stat EEG was obtained to rule out nonconvulsive status epilepticus.  CT head did show M2 hyperdensity, however patient not candidate for thrombectomy even if this were to represent an acute clot due to patient's poor functional baseline.  On today's exam patient is extremely lethargic but moving all extremities with 5/5 strength.  Patient did receive Haldol at 6 AM which is likely causing her lethargy.  However it was noted, that she was extremely agitated all night.  No further seizures have been noted.  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   05/29/2020, 9:39 AM  NEUROHOSPITALIST ADDENDUM Performed a face to face diagnostic evaluation.   I have reviewed the contents of history and physical exam as documented by PA/ARNP/Resident and agree with above documentation.  I have discussed and formulated the above plan as documented. Edits to the note have been made as needed.   Patient who presented with seizures/focal status-improved after Keppra.   No longer has Todd's paresis on the left side, suspect hyperdense vessel on CT due to calcification and not thrombus.  Continue Keppra and seizure precautions, if remains agitated may consider switching to Depakote.    Georgiana Spinner Amilee Janvier MD [facility] [phone]   If 7pm to 7am, please call on call as listed on AMION.

## 2020-05-29 NOTE — Progress Notes (Signed)
Progress Note    Kendra Smith  XLK:[id number] DOB: 15-Mar-1926  DOA: 05/28/2020 PCP: Patient, No Pcp Per    Brief Narrative:    Medical records reviewed and are as summarized below:  Kendra Smith is an 86 y.o. female (from SNF: [facility]) with medical history significant of dementia, hypertension, hyperlipidemia, paroxysmal A. fib, came into the hospital brought by her nursing home due to seizure-like activity.  Patient is currently agitated and with underlying dementia cannot contribute to the story.  History is per EDP, the nursing facility reported that patient had sudden onset of generalized tonic-clonic seizure lasting approximately a minute.  It broke away on its own however had recurrent seizure that lasted about 30 seconds.  EMS arrived and patient was found to be postictal with left-sided hemiparesis and gaze to the left.  She had a third seizure in the ED.  Neurology has been consulted and she was loaded with Keppra.  There is no reported history of seizures.  Thought to be in status epilepticus that has now been broken.  MRI pending when patient able to cooperate    Assessment/Plan:   Active Problems:   Dementia (HCC)   UTI (urinary tract infection)   DNR (do not resuscitate)   Seizure (HCC)  Status epilepticus -new onset today, neurology consulted and following. - She underwent a CT scan of the head code stroke due to left-sided hemiparesis with questionable hyperdense right MCA branches in the sylvian fissure.  Neurology recommended MRI to r/o cva vs todd's paralysis however she continues to not be able to hold still -Has received Keppra, continue to closely monitor in the progressive setting. -failed swallow eval, SLP Eval pending  Leukocytosis -suspected due to UTI -see below  Paroxysmal A. fib-patient not on anticoagulation due to suspect lower GI bleed in November 2020, also doubt she is a good candidate due to underlying dementia.  Continue  digoxin.  AKI -resolved  Advanced dementia - more agitated after Ativan but improved with haldol  UTI -culture with gram negative rods -IV abx  Hypokalemia -replete IV As unable to take PO   Family Communication/Anticipated D/C date and plan/Code Status   DVT prophylaxis: Lovenox ordered. Code Status: dnr Family Communication: LM for daughter (Ms. Long) Disposition Plan: Status is: Inpatient  Remains inpatient appropriate because:Inpatient level of care appropriate due to severity of illness   Dispo: The patient is from: SNF              Anticipated d/c is to: SNF              Anticipated d/c date is: > 3 days              Patient currently is not medically stable to d/c.         Medical Consultants:    Neuro  Subjective:   Nursing reports ativan made patient's agitation worse and haldol helped  Objective:    Vitals:   05/29/20 0332 05/29/20 0432 05/29/20 0526 05/29/20 1247  BP:  (!) 131/119  (!) 113/57  Pulse: (!) 120 (!) 123  87  Resp:   20   Temp:  98.7 F (37.1 C)    TempSrc:  Oral    SpO2: 97%     Weight:  59.6 kg     No intake or output data in the 24 hours ending 05/29/20 1427 Filed Weights   05/29/20 0432  Weight: 59.6 kg    Exam:  General: Appearance:  Chronically ill appearing female - agitated      Lungs:     respirations unlabored  Heart:   mildly tachycardic when agitated  MS:   Moved all 4 ext  Neurologic:  awake, fighting with tech doing vitals, not following commands    Data Reviewed:   I have personally reviewed following labs and imaging studies:  Labs: Labs show the following:   Basic Metabolic Panel: Recent Labs  Lab 05/28/20 1445 05/28/20 1445 05/28/20 1451 05/29/20 0939  NA 142  --  143 140  K 3.8   < > 3.7 2.8*  CL 102  --  104 97*  CO2 19*  --   --  28  GLUCOSE 201*  --  194* 111*  BUN 12  --  12 9  CREATININE 1.28*  --  1.10* 1.05*  CALCIUM 9.6  --   --  8.9   < > = values in this interval  not displayed.   GFR CrCl cannot be calculated (Unknown ideal weight.). Liver Function Tests: Recent Labs  Lab 05/28/20 1445 05/29/20 0939  AST 25 32  ALT 13 14  ALKPHOS 108 86  BILITOT 0.9 1.0  PROT 7.6 7.1  ALBUMIN 3.1* 2.7*   No results for input(s): LIPASE, AMYLASE in the last 168 hours. No results for input(s): AMMONIA in the last 168 hours. Coagulation profile Recent Labs  Lab 05/28/20 1445  INR 1.1    CBC: Recent Labs  Lab 05/28/20 1445 05/28/20 1451 05/29/20 0939  WBC 20.7*  --  15.0*  NEUTROABS 14.6*  --   --   HGB 11.8* 12.9 10.8*  HCT 39.9 38.0 34.5*  MCV 95.5  --  91.8  PLT 338  --  296   Cardiac Enzymes: No results for input(s): CKTOTAL, CKMB, CKMBINDEX, TROPONINI in the last 168 hours. BNP (last 3 results) No results for input(s): PROBNP in the last 8760 hours. CBG: Recent Labs  Lab 05/28/20 1443  GLUCAP 183*   D-Dimer: No results for input(s): DDIMER in the last 72 hours. Hgb A1c: No results for input(s): HGBA1C in the last 72 hours. Lipid Profile: No results for input(s): CHOL, HDL, LDLCALC, TRIG, CHOLHDL, LDLDIRECT in the last 72 hours. Thyroid function studies: Recent Labs    05/28/20 1445  TSH 3.971   Anemia work up: No results for input(s): VITAMINB12, FOLATE, FERRITIN, TIBC, IRON, RETICCTPCT in the last 72 hours. Sepsis Labs: Recent Labs  Lab 05/28/20 1445 05/29/20 0939  WBC 20.7* 15.0*    Microbiology Recent Results (from the past 240 hour(s))  SARS Coronavirus 2 by RT PCR (hospital order, performed in [facility] hospital lab) Nasopharyngeal Nasopharyngeal Swab     Status: None   Collection Time: 05/28/20  3:07 PM   Specimen: Nasopharyngeal Swab  Result Value Ref Range Status   SARS Coronavirus 2 NEGATIVE NEGATIVE Final    Comment: (NOTE) SARS-CoV-2 target nucleic acids are NOT DETECTED.  The SARS-CoV-2 RNA is generally detectable in upper and lower respiratory specimens during the acute phase of infection. The  lowest concentration of SARS-CoV-2 viral copies this assay can detect is 250 copies / mL. A negative result does not preclude SARS-CoV-2 infection and should not be used as the sole basis for treatment or other patient management decisions.  A negative result may occur with improper specimen collection / handling, submission of specimen other than nasopharyngeal swab, presence of viral mutation(s) within the areas targeted by this assay, and inadequate number of viral copies (<250  copies / mL). A negative result must be combined with clinical observations, patient history, and epidemiological information.  Fact Sheet for Patients:   BoilerBrush.com.cyhttps://www.fda.gov/media/136312/download  Fact Sheet for Healthcare Providers: https://pope.com/https://www.fda.gov/media/136313/download  This test is not yet approved or  cleared by the [place] FDA and has been authorized for detection and/or diagnosis of SARS-CoV-2 by FDA under an Emergency Use Authorization (EUA).  This EUA will remain in effect (meaning this test can be used) for the duration of the COVID-19 declaration under Section 564(b)(1) of the Act, 21 U.S.C. section 360bbb-3(b)(1), unless the authorization is terminated or revoked sooner.  Performed at [facility], [address] [place], [place] [address]   Urine culture     Status: Abnormal (Preliminary result)   Collection Time: 05/28/20  6:53 PM   Specimen: Urine, Random  Result Value Ref Range Status   Specimen Description URINE, RANDOM  Final   Special Requests   Final    NONE Performed at [facility], [address] [place], [place] [address]    Culture >=100,000 COLONIES/mL GRAM NEGATIVE RODS (A)  Final   Report Status PENDING  Incomplete    Procedures and diagnostic studies:  EEG  Result Date: 05/28/2020 Charlsie QuestYadav, Priyanka O, MD     05/28/2020  4:27 PM Patient Name: Kendra Smith MRN: [id number] Epilepsy Attending: Charlsie QuestPriyanka O Yadav Referring Physician/Provider: Felicie Mornavid  Smith, PA Date: 05/28/2020 Duration: 20.46 mins Patient history: [age] year old female [facility] department secondary to tonic-clonic seizures while at nursing home. Another further seizure that was initially partial with secondary generalization while in the ED. EEG to evaluate for seizure. Level of alertness: lethargic AEDs during EEG study: LEV Technical aspects: This EEG study was done with scalp electrodes positioned according to the 10-20 International system of electrode placement. Electrical activity was acquired at a sampling rate of 500Hz  and reviewed with a high frequency filter of 70Hz  and a low frequency filter of 1Hz . EEG data were recorded continuously and digitally stored. Description: EEG showed continuous/intermittent generalized 3 to 6 Hz theta-delta slowing.  Hyperventilation and photic stimulation were not performed.   EEG was technically difficult due to significant movement artifact. ABNORMALITY -Continuous slow, generalized IMPRESSION: This technically difficult study is suggestive of severe diffuse encephalopathy, nonspecific etiology.  No seizures or epileptiform discharges were seen throughout the recording. Charlsie QuestPriyanka O Yadav   DG Chest Portable 1 View  Result Date: 05/28/2020 CLINICAL DATA:  Code stroke altered mental status EXAM: PORTABLE CHEST 1 VIEW COMPARISON:  08/30/2019, 08/23/2019, 06/29/2014 chest CT 01/12/2019 FINDINGS: Mildly diminished lung volumes. Probable skin fold artifact over the left lower chest. Cardiomegaly. Minimal atelectasis or scar at the medial left base. Aortic atherosclerosis. No pneumothorax. Slightly irregular left hilar opacity. IMPRESSION: Low lung volumes with atelectasis or scar at the medial left base. Slightly irregular left hilar opacity suspect that this is related to rotation and hilar vessels though recommend short interval two-view chest follow-up to exclude hilar distortion. Electronically Signed   By: Jasmine PangKim  Fujinaga M.D.    On: 05/28/2020 16:47   CT HEAD CODE STROKE WO CONTRAST  Result Date: 05/28/2020 CLINICAL DATA:  Code stroke.  Left-sided weakness.  Seizure. EXAM: CT HEAD WITHOUT CONTRAST TECHNIQUE: Contiguous axial images were obtained from the base of the skull through the vertex without intravenous contrast. COMPARISON:  08/23/2019 FINDINGS: Brain: Generalized atrophy. Extensive chronic small-vessel ischemic changes of the hemispheric white matter. Old right frontal white matter infarction. No sign of acute infarction, mass  lesion, hemorrhage, hydrocephalus or extra-axial collection. Vascular: Question hyperdense right MCA branches in the sylvian fissure. Skull: Negative Sinuses/Orbits: Clear except for opacification of the right division of the sphenoid sinus. Orbits negative. Other: None ASPECTS (Alberta Stroke Program Early CT Score) - Ganglionic level infarction (caudate, lentiform nuclei, internal capsule, insula, M1-M3 cortex): 7 - Supraganglionic infarction (M4-M6 cortex): 3 Total score (0-10 with 10 being normal): 10 IMPRESSION: 1. Atrophy and chronic small-vessel ischemic changes. Question hyperdense right MCA branches in the sylvian fissure. 2. ASPECTS is 10 3. These results were communicated to Dr. Laurence Slate at 3:08 pmon 7/19/2021by text page via the [facility] messaging system. Electronically Signed   By: Paulina Fusi M.D.   On: 05/28/2020 15:09    Medications:   . calcitonin (salmon)  1 spray Alternating Nares Daily  . calcium carbonate  1 tablet Oral Q breakfast  . cholecalciferol  1,000 Units Oral Q breakfast  . digoxin  0.125 mg Intravenous QODAY  . donepezil  5 mg Oral QHS  . DULoxetine  60 mg Oral Q breakfast  . enoxaparin (LOVENOX) injection  40 mg Subcutaneous Q24H  . HYDROcodone-acetaminophen  1 tablet Oral BID  . latanoprost  1 drop Both Eyes QHS  . pantoprazole (PROTONIX) IV  40 mg Intravenous Q24H  . pregabalin  50 mg Oral BID  . timolol  1 drop Both Eyes Daily  . traZODone  25 mg Oral QPM    Continuous Infusions: . acetaminophen Stopped (05/29/20 0023)  . cefTRIAXone (ROCEPHIN)  IV 200 mL/hr at 05/29/20 0909  . levETIRAcetam 500 mg (05/29/20 1150)     LOS: 1 day   Joseph Art  [facility]   How to contact the [facility] Attending or Consulting provider 7A - 7P or covering provider during after hours 7P -7A, for this patient?  1. Check the care team in [facility] and look for a) attending/consulting [facility] provider listed and b) the [facility] team listed 2. Log into [email or web address] and use [place]'s universal password to access. If you do not have the password, please contact the hospital operator. 3. Locate the [facility] provider you are looking for under [facility] and page to a number that you can be directly reached. 4. If you still have difficulty reaching the provider, please page the [facility] (Director on Call) for the Hospitalists listed on amion for assistance.  05/29/2020, 2:27 PM

## 2020-05-29 NOTE — Progress Notes (Signed)
Initial Nutrition Assessment  DOCUMENTATION CODES:   Not applicable  INTERVENTION:   Once diet advanced:   Ensure Enlive po BID, each supplement provides 350 kcal and 20 grams of protein   MVI daily   NUTRITION DIAGNOSIS:   Inadequate oral intake related to lethargy/confusion as evidenced by NPO status.  GOAL:   Patient will meet greater than or equal to 90% of their needs   MONITOR:   PO intake, Supplement acceptance, Weight trends, I & O's, Labs, Diet advancement  REASON FOR ASSESSMENT:   Malnutrition Screening Tool    ASSESSMENT:   Patient with PMH significant for dementia, HTN, HLD, and depression. Presents this admission with seizure-like activity.   Pt unable to provide history. Per chart, was agitated last night, required haldol. No family present. Remains NPO. SLP attempted to assess swallow function but with current mental status was unable to completion. Plan to try later. Will attempt to obtain nutrition history if possible.   Unsure of UBW. Records indicate pt weighed 154 lb on 08/30/2019 and 131 lb this admission. Suspect malnutrition but unable to diagnose without current history.   Medications: os-cal, Vit D Labs: K 2.8 (L) CBG 111-201  NUTRITION - FOCUSED PHYSICAL EXAM:    Most Recent Value  Orbital Region No depletion  Upper Arm Region Mild depletion  Thoracic and Lumbar Region Unable to assess  Buccal Region No depletion  Temple Region No depletion  Clavicle Bone Region Mild depletion  Clavicle and Acromion Bone Region Mild depletion  Scapular Bone Region Unable to assess  Dorsal Hand Unable to assess  Patellar Region Moderate depletion  Anterior Thigh Region Moderate depletion  Posterior Calf Region Severe depletion  Edema (RD Assessment) Mild  Hair Reviewed  Eyes Reviewed  Mouth Reviewed  Skin Reviewed  Nails Reviewed     Diet Order:   Diet Order            Diet NPO time specified  Diet effective now                  EDUCATION NEEDS:   Not appropriate for education at this time  Skin:  Skin Assessment: Reviewed RN Assessment  Last BM:  PTA  Height:   Ht Readings from Last 1 Encounters:  08/30/19 5\' 3"  (1.6 m)    Weight:   Wt Readings from Last 1 Encounters:  05/29/20 59.6 kg    BMI:  Body mass index is 23.28 kg/m.  Estimated Nutritional Needs:   Kcal:  1600-1800 kcal  Protein:  80-95 grams  Fluid:  >/= 1.6 L/day   05/31/20 RD, LDN Clinical Nutrition Pager listed in AMION

## 2020-05-30 ENCOUNTER — Inpatient Hospital Stay (HOSPITAL_COMMUNITY): Payer: Medicare Other

## 2020-05-30 LAB — BASIC METABOLIC PANEL
Anion gap: 14 (ref 5–15)
BUN: 8 mg/dL (ref 8–23)
CO2: 26 mmol/L (ref 22–32)
Calcium: 8.5 mg/dL — ABNORMAL LOW (ref 8.9–10.3)
Chloride: 100 mmol/L (ref 98–111)
Creatinine, Ser: 0.98 mg/dL (ref 0.44–1.00)
GFR calc Af Amer: 58 mL/min — ABNORMAL LOW (ref >60)
GFR calc non Af Amer: 50 mL/min — ABNORMAL LOW (ref >60)
Glucose, Bld: 89 mg/dL (ref 70–99)
Potassium: 3.1 mmol/L — ABNORMAL LOW (ref 3.5–5.1)
Sodium: 140 mmol/L (ref 135–145)

## 2020-05-30 LAB — CBC
HCT: 34.7 % — ABNORMAL LOW (ref 36.0–46.0)
Hemoglobin: 11 g/dL — ABNORMAL LOW (ref 12.0–15.0)
MCH: 28.5 pg (ref 26.0–34.0)
MCHC: 31.7 g/dL (ref 30.0–36.0)
MCV: 89.9 fL (ref 80.0–100.0)
Platelets: 244 10*3/uL (ref 150–400)
RBC: 3.86 MIL/uL — ABNORMAL LOW (ref 3.87–5.11)
RDW: 16.1 % — ABNORMAL HIGH (ref 11.5–15.5)
WBC: 13.5 10*3/uL — ABNORMAL HIGH (ref 4.0–10.5)
nRBC: 0 % (ref 0.0–0.2)

## 2020-05-30 LAB — URINE CULTURE: Culture: >=100000 — AB

## 2020-05-30 LAB — MAGNESIUM: Magnesium: 2.3 mg/dL (ref 1.7–2.4)

## 2020-05-30 MED ORDER — CEFAZOLIN SODIUM-DEXTROSE 1-4 GM/50ML-% IV SOLN
1.0000 g | Freq: Three times a day (TID) | INTRAVENOUS | Status: DC
  Administered 2020-05-31 (×2): 1 g via INTRAVENOUS
  Filled 2020-05-30 (×3): qty 50

## 2020-05-30 MED ORDER — POTASSIUM CHLORIDE 10 MEQ/100ML IV SOLN
10.0000 meq | INTRAVENOUS | Status: AC
  Administered 2020-05-30 (×3): 10 meq via INTRAVENOUS
  Filled 2020-05-30 (×3): qty 100

## 2020-05-30 NOTE — Evaluation (Signed)
Clinical/Bedside Swallow Evaluation Patient Details  Name: Kendra Smith MRN: [id number] Date of Birth: April 18, 1926  Today's Date: 05/30/2020 Time: SLP Start Time (ACUTE ONLY): 0843 SLP Stop Time (ACUTE ONLY): 0900 SLP Time Calculation (min) (ACUTE ONLY): 17 min  Past Medical History:  Past Medical History:  Diagnosis Date  . Depression   . Glaucoma   . Hyperlipidemia   . Hypertension   . Osteoporosis    Past Surgical History:  Past Surgical History:  Procedure Laterality Date  . ABDOMINAL HYSTERECTOMY    . ANKLE ARTHROSCOPY    . CHOLECYSTECTOMY    . EYE SURGERY    . RECTOCELE REPAIR     HPI:  Kendra Smith is a 86 y.o. female with medical history significant of dementia, hypertension, hyperlipidemia, paroxysmal A. fib, admitted with seizure-like activity (new onset). Head CT atrophy and chronic small-vessel ischemic changes. Question hyperdense right MCA branches in the sylvian fissure. Pt could not remain still for MRI. Leukocytosis suspected due to UTI per MD note.   Assessment / Plan / Recommendation Clinical Impression  Pt alert, following commands, confused with baseline dementia and participatory. Lingual candidias present, lower partial donned with strong volitional cough and stable respirations. Subsequent straw sips appeared coordinated in regards to respirations and onset from subjective view without any s/s aspiration across consistencies and trials. Although mastication was unremarkable given dementia, new environment and deconditioned stated will order a lower texture of Dys 2 (fine chopped), thin liquids, straws allowed, pills whole in puree and FULL supervision. ST will follow.    SLP Visit Diagnosis: Dysphagia, unspecified (R13.10)    Aspiration Risk  Mild aspiration risk    Diet Recommendation Dysphagia 2 (Fine chop);Thin liquid   Liquid Administration via: Cup;Straw Medication Administration: Whole meds with puree Supervision: Staff to assist with self  feeding;Full supervision/cueing for compensatory strategies;Patient able to self feed Compensations: Minimize environmental distractions;Slow rate;Small sips/bites;Lingual sweep for clearance of pocketing Postural Changes: Seated upright at 90 degrees    Other  Recommendations Oral Care Recommendations: Oral care BID   Follow up Recommendations None      Frequency and Duration min 2x/week  2 weeks       Prognosis Prognosis for Safe Diet Advancement: Fair Barriers to Reach Goals: Cognitive deficits      Swallow Study   General HPI: Kendra Smith is a 86 y.o. female with medical history significant of dementia, hypertension, hyperlipidemia, paroxysmal A. fib, admitted with seizure-like activity (new onset). Head CT atrophy and chronic small-vessel ischemic changes. Question hyperdense right MCA branches in the sylvian fissure. Pt could not remain still for MRI. Leukocytosis suspected due to UTI per MD note. Type of Study: Bedside Swallow Evaluation Previous Swallow Assessment:  (none) Diet Prior to this Study: NPO Temperature Spikes Noted: No Respiratory Status: Room air History of Recent Intubation: No Behavior/Cognition: Alert;Cooperative;Pleasant mood;Confused;Requires cueing Oral Cavity Assessment: Other (comment) (lingual candidia) Oral Care Completed by SLP: Yes Oral Cavity - Dentition: Dentures, top;Dentures, bottom (bottom is a partial) Vision:  (??) Self-Feeding Abilities: Needs assist Patient Positioning: Upright in bed Baseline Vocal Quality: Normal Volitional Cough: Strong Volitional Swallow: Able to elicit    Oral/Motor/Sensory Function Overall Oral Motor/Sensory Function: Within functional limits   Ice Chips Ice chips: Not tested   Thin Liquid Thin Liquid: Within functional limits Presentation: Cup;Straw;Spoon    Nectar Thick Nectar Thick Liquid: Not tested   Honey Thick Honey Thick Liquid: Not tested   Puree Puree: Within functional limits   Solid  Solid: Within functional limits      Royce Macadamia 05/30/2020,9:15 AM  Breck Coons Lonell Face.Ed Nurse, children's [phone] Office [phone]

## 2020-05-30 NOTE — Progress Notes (Addendum)
PROGRESS NOTE    Kendra Smith  VVO:[id number] DOB: 16-Oct-1926 DOA: 05/28/2020 PCP: Patient, No Pcp Per   Brief Narrative:   Kendra Smith is an 86 y.o. female (from SNF: [facility]) with medical history significant ofdementia, hypertension, hyperlipidemia, paroxysmal A. fib, came into the hospital brought by her nursing home due to seizure-like activity. Patient is currently agitated and with underlying dementia cannot contribute to the story. History is per EDP, the nursing facility reported that patient had sudden onset of generalized tonic-clonic seizure lasting approximately a minute. It broke away on its own however had recurrent seizure that lasted about 30 seconds. EMS arrived and patient was found to be postictal with left-sided hemiparesis and gaze to the left. She had a third seizure in the ED. Neurology has been consulted and she was loaded with Keppra.There is no reported history of seizures.  Thought to be in status epilepticus that has now been broken.  MRI pending when patient able to cooperate  Assessment & Plan:   Active Problems:   Dementia (HCC)   UTI (urinary tract infection)   DNR (do not resuscitate)   Seizure (HCC)   Status epilepticus -new onset today, neurology consulted and following. -She underwent a CT scan of the head code stroke due to left-sided hemiparesis with questionable hyperdense right MCA branches in the sylvian fissure. Neurology recommended MRI to r/o cva vs todd's paralysis however she continues to not be able to hold still -Has received Keppra, continue to closely monitor in the progressive setting. -SLP eval with dysphagia 2 diet starting 7/21 -Appreciate further neurology evaluation with consideration for MRI today as needed. -Continue Keppra 500 IV twice daily, transition to p.o. once tolerating diet.  Leukocytosis-improving -suspected due to UTI -see below  Paroxysmal A. fib-patient not on anticoagulation due to  suspect lower GI bleed in November 2020, also doubt she is a good candidate due to underlying dementia. Continue digoxin.  AKI -resolved  Advanced dementia - more agitated after Ativan but improved with haldol  UTI-Klebsiella pneumoniae -Await sensitivities -IV Rocephin  Hypokalemia -replete IV for now -f/u in am with Mg   DVT prophylaxis:Lovenox Code Status: DNR Family Communication: Plan to call daughter Disposition Plan:   Status is: Inpatient  Remains inpatient appropriate because:IV treatments appropriate due to intensity of illness or inability to take PO and Inpatient level of care appropriate due to severity of illness   Dispo: The patient is from: SNF              Anticipated d/c is to: SNF              Anticipated d/c date is: 2 days              Patient currently is not medically stable to d/c.  Consultants:   Neurology  Procedures:   See below  Antimicrobials:  Anti-infectives (From admission, onward)   Start     Dose/Rate Route Frequency Ordered Stop   05/29/20 0600  cefTRIAXone (ROCEPHIN) 1 g in sodium chloride 0.9 % 100 mL IVPB     Discontinue     1 g 200 mL/hr over 30 Minutes Intravenous Daily 05/29/20 0543         Subjective: Patient seen and evaluated today with no new acute complaints or concerns. No acute concerns or events noted overnight. She is asking for ice chips and has been seen by SLP with recommendations for dysphagia diet.  Objective: Vitals:   05/29/20 1247 05/30/20 0044 05/30/20 0500 05/30/20  0741  BP: (!) 113/57 (!) 112/58 124/63 (!) 156/64  Pulse: 87 87 83 83  Resp:    17  Temp:  98 F (36.7 C) 97.7 F (36.5 C) 98.7 F (37.1 C)  TempSrc:  Axillary Axillary   SpO2:  93% 97% 93%  Weight:        Intake/Output Summary (Last 24 hours) at 05/30/2020 1028 Last data filed at 05/30/2020 0400 Gross per 24 hour  Intake 200 ml  Output --  Net 200 ml   Filed Weights   05/29/20 0432  Weight: 59.6 kg     Examination:  General exam: Appears calm and comfortable  Respiratory system: Clear to auscultation. Respiratory effort normal. Cardiovascular system: S1 & S2 heard, RRR. No JVD, murmurs, rubs, gallops or clicks. No pedal edema. Gastrointestinal system: Abdomen is nondistended, soft and nontender. No organomegaly or masses felt. Normal bowel sounds heard. Central nervous system: Alert and oriented. No focal neurological deficits. Extremities: Symmetric 5 x 5 power. Skin: No rashes, lesions or ulcers Psychiatry: Difficult to assess.    Data Reviewed: I have personally reviewed following labs and imaging studies  CBC: Recent Labs  Lab 05/28/20 1445 05/28/20 1451 05/29/20 0939 05/30/20 0236  WBC 20.7*  --  15.0* 13.5*  NEUTROABS 14.6*  --   --   --   HGB 11.8* 12.9 10.8* 11.0*  HCT 39.9 38.0 34.5* 34.7*  MCV 95.5  --  91.8 89.9  PLT 338  --  296 244   Basic Metabolic Panel: Recent Labs  Lab 05/28/20 1445 05/28/20 1451 05/29/20 0939 05/30/20 0236  NA 142 143 140 140  K 3.8 3.7 2.8* 3.1*  CL 102 104 97* 100  CO2 19*  --  28 26  GLUCOSE 201* 194* 111* 89  BUN 12 12 9 8   CREATININE 1.28* 1.10* 1.05* 0.98  CALCIUM 9.6  --  8.9 8.5*  MG  --   --   --  2.3   GFR: CrCl cannot be calculated (Unknown ideal weight.). Liver Function Tests: Recent Labs  Lab 05/28/20 1445 05/29/20 0939  AST 25 32  ALT 13 14  ALKPHOS 108 86  BILITOT 0.9 1.0  PROT 7.6 7.1  ALBUMIN 3.1* 2.7*   No results for input(s): LIPASE, AMYLASE in the last 168 hours. No results for input(s): AMMONIA in the last 168 hours. Coagulation Profile: Recent Labs  Lab 05/28/20 1445  INR 1.1   Cardiac Enzymes: No results for input(s): CKTOTAL, CKMB, CKMBINDEX, TROPONINI in the last 168 hours. BNP (last 3 results) No results for input(s): PROBNP in the last 8760 hours. HbA1C: No results for input(s): HGBA1C in the last 72 hours. CBG: Recent Labs  Lab 05/28/20 1443  GLUCAP 183*   Lipid  Profile: No results for input(s): CHOL, HDL, LDLCALC, TRIG, CHOLHDL, LDLDIRECT in the last 72 hours. Thyroid Function Tests: Recent Labs    05/28/20 1445  TSH 3.971   Anemia Panel: No results for input(s): VITAMINB12, FOLATE, FERRITIN, TIBC, IRON, RETICCTPCT in the last 72 hours. Sepsis Labs: No results for input(s): PROCALCITON, LATICACIDVEN in the last 168 hours.  Recent Results (from the past 240 hour(s))  SARS Coronavirus 2 by RT PCR (hospital order, performed in [facility] hospital lab) Nasopharyngeal Nasopharyngeal Swab     Status: None   Collection Time: 05/28/20  3:07 PM   Specimen: Nasopharyngeal Swab  Result Value Ref Range Status   SARS Coronavirus 2 NEGATIVE NEGATIVE Final    Comment: (NOTE) SARS-CoV-2 target nucleic  acids are NOT DETECTED.  The SARS-CoV-2 RNA is generally detectable in upper and lower respiratory specimens during the acute phase of infection. The lowest concentration of SARS-CoV-2 viral copies this assay can detect is 250 copies / mL. A negative result does not preclude SARS-CoV-2 infection and should not be used as the sole basis for treatment or other patient management decisions.  A negative result may occur with improper specimen collection / handling, submission of specimen other than nasopharyngeal swab, presence of viral mutation(s) within the areas targeted by this assay, and inadequate number of viral copies (<250 copies / mL). A negative result must be combined with clinical observations, patient history, and epidemiological information.  Fact Sheet for Patients:   BoilerBrush.com.cyhttps://www.fda.gov/media/136312/download  Fact Sheet for Healthcare Providers: https://pope.com/https://www.fda.gov/media/136313/download  This test is not yet approved or  cleared by the [place] FDA and has been authorized for detection and/or diagnosis of SARS-CoV-2 by FDA under an Emergency Use Authorization (EUA).  This EUA will remain in effect (meaning this test can be used)  for the duration of the COVID-19 declaration under Section 564(b)(1) of the Act, 21 U.S.C. section 360bbb-3(b)(1), unless the authorization is terminated or revoked sooner.  Performed at [facility], [address] [place], [place] [address]   Urine culture     Status: Abnormal   Collection Time: 05/28/20  6:53 PM   Specimen: Urine, Random  Result Value Ref Range Status   Specimen Description URINE, RANDOM  Final   Special Requests   Final    NONE Performed at [facility], [address] [place], [place] [address]    Culture >=100,000 COLONIES/mL KLEBSIELLA PNEUMONIAE (A)  Final   Report Status 05/30/2020 FINAL  Final   Organism ID, Bacteria KLEBSIELLA PNEUMONIAE (A)  Final      Susceptibility   Klebsiella pneumoniae - MIC*    AMPICILLIN >=32 RESISTANT Resistant     CEFAZOLIN <=4 SENSITIVE Sensitive     CEFTRIAXONE <=0.25 SENSITIVE Sensitive     CIPROFLOXACIN <=0.25 SENSITIVE Sensitive     GENTAMICIN <=1 SENSITIVE Sensitive     IMIPENEM <=0.25 SENSITIVE Sensitive     NITROFURANTOIN <=16 SENSITIVE Sensitive     TRIMETH/SULFA <=20 SENSITIVE Sensitive     AMPICILLIN/SULBACTAM 4 SENSITIVE Sensitive     PIP/TAZO <=4 SENSITIVE Sensitive     * >=100,000 COLONIES/mL KLEBSIELLA PNEUMONIAE         Radiology Studies: EEG  Result Date: 05/28/2020 Charlsie QuestYadav, Priyanka O, MD     05/28/2020  4:27 PM Patient Name: Kendra [name] MRN: [id number] Epilepsy Attending: Charlsie QuestPriyanka O Yadav Referring Physician/Provider: Felicie Mornavid Smith, PA Date: 05/28/2020 Duration: 20.46 mins Patient history: 86 year old female [facility] department secondary to tonic-clonic seizures while at nursing home. Another further seizure that was initially partial with secondary generalization while in the ED. EEG to evaluate for seizure. Level of alertness: lethargic AEDs during EEG study: LEV Technical aspects: This EEG study was done with scalp electrodes positioned according to the 10-20  International system of electrode placement. Electrical activity was acquired at a sampling rate of 500Hz  and reviewed with a high frequency filter of 70Hz  and a low frequency filter of 1Hz . EEG data were recorded continuously and digitally stored. Description: EEG showed continuous/intermittent generalized 3 to 6 Hz theta-delta slowing.  Hyperventilation and photic stimulation were not performed.   EEG was technically difficult due to significant movement artifact. ABNORMALITY -Continuous slow, generalized IMPRESSION: This technically difficult study is suggestive of severe  diffuse encephalopathy, nonspecific etiology.  No seizures or epileptiform discharges were seen throughout the recording. Charlsie Quest   DG Chest Portable 1 View  Result Date: 05/28/2020 CLINICAL DATA:  Code stroke altered mental status EXAM: PORTABLE CHEST 1 VIEW COMPARISON:  08/30/2019, 08/23/2019, 06/29/2014 chest CT 01/12/2019 FINDINGS: Mildly diminished lung volumes. Probable skin fold artifact over the left lower chest. Cardiomegaly. Minimal atelectasis or scar at the medial left base. Aortic atherosclerosis. No pneumothorax. Slightly irregular left hilar opacity. IMPRESSION: Low lung volumes with atelectasis or scar at the medial left base. Slightly irregular left hilar opacity suspect that this is related to rotation and hilar vessels though recommend short interval two-view chest follow-up to exclude hilar distortion. Electronically Signed   By: Jasmine Pang M.D.   On: 05/28/2020 16:47   CT HEAD CODE STROKE WO CONTRAST  Result Date: 05/28/2020 CLINICAL DATA:  Code stroke.  Left-sided weakness.  Seizure. EXAM: CT HEAD WITHOUT CONTRAST TECHNIQUE: Contiguous axial images were obtained from the base of the skull through the vertex without intravenous contrast. COMPARISON:  08/23/2019 FINDINGS: Brain: Generalized atrophy. Extensive chronic small-vessel ischemic changes of the hemispheric white matter. Old right frontal white  matter infarction. No sign of acute infarction, mass lesion, hemorrhage, hydrocephalus or extra-axial collection. Vascular: Question hyperdense right MCA branches in the sylvian fissure. Skull: Negative Sinuses/Orbits: Clear except for opacification of the right division of the sphenoid sinus. Orbits negative. Other: None ASPECTS (Alberta Stroke Program Early CT Score) - Ganglionic level infarction (caudate, lentiform nuclei, internal capsule, insula, M1-M3 cortex): 7 - Supraganglionic infarction (M4-M6 cortex): 3 Total score (0-10 with 10 being normal): 10 IMPRESSION: 1. Atrophy and chronic small-vessel ischemic changes. Question hyperdense right MCA branches in the sylvian fissure. 2. ASPECTS is 10 3. These results were communicated to Dr. Laurence Slate at 3:08 pmon 7/19/2021by text page via the [facility] messaging system. Electronically Signed   By: Paulina Fusi M.D.   On: 05/28/2020 15:09        Scheduled Meds: . calcitonin (salmon)  1 spray Alternating Nares Daily  . calcium carbonate  1 tablet Oral Q breakfast  . cholecalciferol  1,000 Units Oral Q breakfast  . digoxin  0.125 mg Intravenous QODAY  . donepezil  5 mg Oral QHS  . DULoxetine  60 mg Oral Q breakfast  . enoxaparin (LOVENOX) injection  40 mg Subcutaneous Q24H  . HYDROcodone-acetaminophen  1 tablet Oral BID  . latanoprost  1 drop Both Eyes QHS  . pantoprazole (PROTONIX) IV  40 mg Intravenous Q24H  . pregabalin  50 mg Oral BID  . timolol  1 drop Both Eyes Daily  . traZODone  25 mg Oral QPM   Continuous Infusions: . cefTRIAXone (ROCEPHIN)  IV 1 g (05/30/20 0624)  . levETIRAcetam 500 mg (05/30/20 0828)  . potassium chloride 10 mEq (05/30/20 0939)     LOS: 2 days    Time spent: 35 minutes    Karlisha Mathena D Sherryll Burger, DO [facility]  If 7PM-7AM, please contact night-coverage [email or web address] 05/30/2020, 10:28 AM

## 2020-05-30 NOTE — Progress Notes (Signed)
Pt is on D2 of ceftriaxone for UTI. Sens are back and pan sensitive except for ampicillin. Ok to optimize abx to cefazolin until she can take PO to complete 5d of therapy per Dr. Sherryll Burger.  Cefazolin 1g IV q8 x 3 more days  Ulyses Southward, PharmD, [place], AAHIVP, CPP Infectious Disease Pharmacist 05/30/2020 3:55 PM

## 2020-05-31 LAB — MAGNESIUM: Magnesium: 2.2 mg/dL (ref 1.7–2.4)

## 2020-05-31 LAB — BASIC METABOLIC PANEL
Anion gap: 12 (ref 5–15)
BUN: 10 mg/dL (ref 8–23)
CO2: 25 mmol/L (ref 22–32)
Calcium: 8.6 mg/dL — ABNORMAL LOW (ref 8.9–10.3)
Chloride: 101 mmol/L (ref 98–111)
Creatinine, Ser: 1.14 mg/dL — ABNORMAL HIGH (ref 0.44–1.00)
GFR calc Af Amer: 49 mL/min — ABNORMAL LOW (ref >60)
GFR calc non Af Amer: 42 mL/min — ABNORMAL LOW (ref >60)
Glucose, Bld: 113 mg/dL — ABNORMAL HIGH (ref 70–99)
Potassium: 2.8 mmol/L — ABNORMAL LOW (ref 3.5–5.1)
Sodium: 138 mmol/L (ref 135–145)

## 2020-05-31 LAB — CBC
HCT: 35.2 % — ABNORMAL LOW (ref 36.0–46.0)
Hemoglobin: 11 g/dL — ABNORMAL LOW (ref 12.0–15.0)
MCH: 29 pg (ref 26.0–34.0)
MCHC: 31.3 g/dL (ref 30.0–36.0)
MCV: 92.9 fL (ref 80.0–100.0)
Platelets: 296 10*3/uL (ref 150–400)
RBC: 3.79 MIL/uL — ABNORMAL LOW (ref 3.87–5.11)
RDW: 16.3 % — ABNORMAL HIGH (ref 11.5–15.5)
WBC: 10.4 10*3/uL (ref 4.0–10.5)
nRBC: 0 % (ref 0.0–0.2)

## 2020-05-31 MED ORDER — POTASSIUM CHLORIDE CRYS ER 20 MEQ PO TBCR
40.0000 meq | EXTENDED_RELEASE_TABLET | Freq: Once | ORAL | Status: AC
  Administered 2020-05-31: 40 meq via ORAL
  Filled 2020-05-31: qty 2

## 2020-05-31 MED ORDER — CEFAZOLIN SODIUM-DEXTROSE 1-4 GM/50ML-% IV SOLN
1.0000 g | Freq: Two times a day (BID) | INTRAVENOUS | Status: DC
  Administered 2020-05-31 – 2020-06-01 (×2): 1 g via INTRAVENOUS
  Filled 2020-05-31 (×4): qty 50

## 2020-05-31 MED ORDER — LEVETIRACETAM 500 MG PO TABS
500.0000 mg | ORAL_TABLET | Freq: Two times a day (BID) | ORAL | Status: DC
  Administered 2020-05-31 – 2020-06-01 (×2): 500 mg via ORAL
  Filled 2020-05-31 (×2): qty 1

## 2020-05-31 MED ORDER — ENOXAPARIN SODIUM 30 MG/0.3ML ~~LOC~~ SOLN
30.0000 mg | SUBCUTANEOUS | Status: DC
  Administered 2020-05-31: 30 mg via SUBCUTANEOUS
  Filled 2020-05-31: qty 0.3

## 2020-05-31 MED ORDER — POTASSIUM CHLORIDE 2 MEQ/ML IV SOLN
INTRAVENOUS | Status: AC
  Filled 2020-05-31 (×2): qty 1000

## 2020-05-31 NOTE — Progress Notes (Signed)
PROGRESS NOTE    Kendra Smith  KZS:[id number] DOB: [birth year] DOA: 05/28/2020 PCP: Patient, No Pcp Per   Brief Narrative:   Kendra Smith an 86 y.o.female(from SNF: [facility])with medical history significant ofdementia, hypertension, hyperlipidemia, paroxysmal A. fib, came into the hospital brought by her nursing home due to seizure-like activity. Patient is currently agitated and with underlying dementia cannot contribute to the story.History is per EDP, the nursing facility reported that patient had sudden onset of generalized tonic-clonic seizure lasting approximately a minute. It broke away on its own however had recurrent seizure that lasted about 30 seconds. EMS arrived and patient was found to be postictal with left-sided hemiparesis and gaze to the left. She had a third seizure in the ED. Neurology has been consulted and she was loaded with Keppra.There is no reported history of seizures.Thought to be in status epilepticus that has now been broken. MRI pending when patient able to cooperate  Assessment & Plan:   Active Problems:   Dementia (HCC)   UTI (urinary tract infection)   DNR (do not resuscitate)   Seizure (HCC)   Status epilepticus -new onset today, neurology consulted and following. -She underwent a CT scan of the head code stroke due to left-sided hemiparesis with questionable hyperdense right MCA branches in the sylvian fissure. Neurology recommended MRIto r/o cva vs todd's paralysishowever she continues to not be able to hold still -Has received Keppra, continue to closely monitor in the progressive setting. -SLP eval with dysphagia 2 diet starting 7/21 -Appreciate further neurology evaluation with consideration for MRI today as needed. -Continue Keppra 500mg  now p.o. twice daily -Brain MRI without any significant findings, discussed with neurology with plans to continue same management for now.  Leukocytosis-improving -suspected  due to UTI -see below  Paroxysmal A. fib-patient not on anticoagulation due to suspect lower GI bleed in November 2020, also doubt she is a good candidate due to underlying dementia. Continue digoxin.  AKI -resolved  Advanced dementia -more agitated afterAtivan but improved with haldol  UTI-Klebsiella pneumoniae -Resistant to ampicillin -IV Cefazolin  Hypokalemia-persistent -replete IV for now -With p.o. ordered as well -f/u in am with Mg   DVT prophylaxis:Lovenox Code Status: DNR Family Communication: Plan to call daughter Disposition Plan:   Status is: Inpatient  Remains inpatient appropriate because:IV treatments appropriate due to intensity of illness or inability to take PO and Inpatient level of care appropriate due to severity of illness   Dispo: The patient is from: SNF  Anticipated d/c is to: SNF  Anticipated d/c date is: 1-2 days  Patient currently is not medically stable to d/c.  Anticipate discharge to SNF by a.m. if tolerating oral medications and potassium improved.  Consultants:   Neurology  Procedures:   See below  Antimicrobials:  Anti-infectives (From admission, onward)   Start     Dose/Rate Route Frequency Ordered Stop   05/31/20 0600  ceFAZolin (ANCEF) IVPB 1 g/50 mL premix     Discontinue     1 g 100 mL/hr over 30 Minutes Intravenous Every 8 hours 05/30/20 1551 06/03/20 0559   05/29/20 0600  cefTRIAXone (ROCEPHIN) 1 g in sodium chloride 0.9 % 100 mL IVPB  Status:  Discontinued        1 g 200 mL/hr over 30 Minutes Intravenous Daily 05/29/20 0543 05/30/20 1551       Subjective: Patient seen and evaluated today with no new acute complaints or concerns. No acute concerns or events noted overnight.  No agitation or seizure activity noted.  Objective: Vitals:   05/30/20 1500 05/30/20 1632 05/30/20 2121 05/31/20 0747  BP:  (!) 147/58 (!) 134/55 135/71  Pulse:  86 94 81  Resp:  18 16 19    Temp:  98.6 F (37 C) 97.9 F (36.6 C) 98.4 F (36.9 C)  TempSrc:      SpO2:  96% 96% 96%  Weight:      Height: 5\' 3"  (1.6 m)       Intake/Output Summary (Last 24 hours) at 05/31/2020 1028 Last data filed at 05/31/2020 0902 Gross per 24 hour  Intake 120 ml  Output --  Net 120 ml   Filed Weights   05/29/20 0432  Weight: 59.6 kg    Examination:  General exam: Appears calm and comfortable  Respiratory system: Clear to auscultation. Respiratory effort normal. Cardiovascular system: S1 & S2 heard, RRR. No JVD, murmurs, rubs, gallops or clicks. No pedal edema. Gastrointestinal system: Abdomen is nondistended, soft and nontender. No organomegaly or masses felt. Normal bowel sounds heard. Central nervous system: Alert and awake. Extremities: Symmetric 5 x 5 power. Skin: No rashes, lesions or ulcers Psychiatry: Judgement and insight appear normal. Mood & affect appropriate.     Data Reviewed: I have personally reviewed following labs and imaging studies  CBC: Recent Labs  Lab 05/28/20 1445 05/28/20 1451 05/29/20 0939 05/30/20 0236 05/31/20 0229  WBC 20.7*  --  15.0* 13.5* 10.4  NEUTROABS 14.6*  --   --   --   --   HGB 11.8* 12.9 10.8* 11.0* 11.0*  HCT 39.9 38.0 34.5* 34.7* 35.2*  MCV 95.5  --  91.8 89.9 92.9  PLT 338  --  296 244 296   Basic Metabolic Panel: Recent Labs  Lab 05/28/20 1445 05/28/20 1451 05/29/20 0939 05/30/20 0236 05/31/20 0229  NA 142 143 140 140 138  K 3.8 3.7 2.8* 3.1* 2.8*  CL 102 104 97* 100 101  CO2 19*  --  28 26 25   GLUCOSE 201* 194* 111* 89 113*  BUN 12 12 9 8 10   CREATININE 1.28* 1.10* 1.05* 0.98 1.14*  CALCIUM 9.6  --  8.9 8.5* 8.6*  MG  --   --   --  2.3 2.2   GFR: Estimated Creatinine Clearance: 26.6 mL/min (A) (by C-G formula based on SCr of 1.14 mg/dL (H)). Liver Function Tests: Recent Labs  Lab 05/28/20 1445 05/29/20 0939  AST 25 32  ALT 13 14  ALKPHOS 108 86  BILITOT 0.9 1.0  PROT 7.6 7.1  ALBUMIN 3.1* 2.7*    No results for input(s): LIPASE, AMYLASE in the last 168 hours. No results for input(s): AMMONIA in the last 168 hours. Coagulation Profile: Recent Labs  Lab 05/28/20 1445  INR 1.1   Cardiac Enzymes: No results for input(s): CKTOTAL, CKMB, CKMBINDEX, TROPONINI in the last 168 hours. BNP (last 3 results) No results for input(s): PROBNP in the last 8760 hours. HbA1C: No results for input(s): HGBA1C in the last 72 hours. CBG: Recent Labs  Lab 05/28/20 1443  GLUCAP 183*   Lipid Profile: No results for input(s): CHOL, HDL, LDLCALC, TRIG, CHOLHDL, LDLDIRECT in the last 72 hours. Thyroid Function Tests: Recent Labs    05/28/20 1445  TSH 3.971   Anemia Panel: No results for input(s): VITAMINB12, FOLATE, FERRITIN, TIBC, IRON, RETICCTPCT in the last 72 hours. Sepsis Labs: No results for input(s): PROCALCITON, LATICACIDVEN in the last 168 hours.  Recent Results (from the past 240 hour(s))  SARS Coronavirus 2 by RT PCR (  hospital order, performed in [facility] hospital lab) Nasopharyngeal Nasopharyngeal Swab     Status: None   Collection Time: 05/28/20  3:07 PM   Specimen: Nasopharyngeal Swab  Result Value Ref Range Status   SARS Coronavirus 2 NEGATIVE NEGATIVE Final    Comment: (NOTE) SARS-CoV-2 target nucleic acids are NOT DETECTED.  The SARS-CoV-2 RNA is generally detectable in upper and lower respiratory specimens during the acute phase of infection. The lowest concentration of SARS-CoV-2 viral copies this assay can detect is 250 copies / mL. A negative result does not preclude SARS-CoV-2 infection and should not be used as the sole basis for treatment or other patient management decisions.  A negative result may occur with improper specimen collection / handling, submission of specimen other than nasopharyngeal swab, presence of viral mutation(s) within the areas targeted by this assay, and inadequate number of viral copies (<250 copies / mL). A negative result must be  combined with clinical observations, patient history, and epidemiological information.  Fact Sheet for Patients:   BoilerBrush.com.cy  Fact Sheet for Healthcare Providers: https://pope.com/  This test is not yet approved or  cleared by the [place] FDA and has been authorized for detection and/or diagnosis of SARS-CoV-2 by FDA under an Emergency Use Authorization (EUA).  This EUA will remain in effect (meaning this test can be used) for the duration of the COVID-19 declaration under Section 564(b)(1) of the Act, 21 U.S.C. section 360bbb-3(b)(1), unless the authorization is terminated or revoked sooner.  Performed at [facility], [address] [place], [place] [address]   Urine culture     Status: Abnormal   Collection Time: 05/28/20  6:53 PM   Specimen: Urine, Random  Result Value Ref Range Status   Specimen Description URINE, RANDOM  Final   Special Requests   Final    NONE Performed at [facility], [address] [place], [place] [address]    Culture >=100,000 COLONIES/mL KLEBSIELLA PNEUMONIAE (A)  Final   Report Status 05/30/2020 FINAL  Final   Organism ID, Bacteria KLEBSIELLA PNEUMONIAE (A)  Final      Susceptibility   Klebsiella pneumoniae - MIC*    AMPICILLIN >=32 RESISTANT Resistant     CEFAZOLIN <=4 SENSITIVE Sensitive     CEFTRIAXONE <=0.25 SENSITIVE Sensitive     CIPROFLOXACIN <=0.25 SENSITIVE Sensitive     GENTAMICIN <=1 SENSITIVE Sensitive     IMIPENEM <=0.25 SENSITIVE Sensitive     NITROFURANTOIN <=16 SENSITIVE Sensitive     TRIMETH/SULFA <=20 SENSITIVE Sensitive     AMPICILLIN/SULBACTAM 4 SENSITIVE Sensitive     PIP/TAZO <=4 SENSITIVE Sensitive     * >=100,000 COLONIES/mL KLEBSIELLA PNEUMONIAE         Radiology Studies: MR BRAIN WO CONTRAST  Result Date: 05/30/2020 CLINICAL DATA:  Neuro deficit, acute, stroke suspected. Code stroke 2 days ago with left-sided weakness and possible  seizure. EXAM: MRI HEAD WITHOUT CONTRAST TECHNIQUE: Multiplanar, multiecho pulse sequences of the brain and surrounding structures were obtained without intravenous contrast. COMPARISON:  CT head without contrast 05/28/2020.  05/11/2019 FINDINGS: Brain: Diffusion-weighted images demonstrate no acute or subacute infarction. The study is moderately degraded by patient motion. Periventricular and subcortical T2 hyperintensities are stable. Remote lacunar infarct of the right centrum semi ovale is stable. Remote lacunar infarcts are present in the thalami bilaterally. Remote infarcts are present the cerebellum bilaterally, left greater than right. No new or acute infarcts are present. Vascular: Flow is present in the major intracranial arteries.  Skull and upper cervical spine: The craniocervical junction is normal. Upper cervical spine is within normal limits. Marrow signal is unremarkable. Sinuses/Orbits: The paranasal sinuses and mastoid air cells are clear. Bilateral lens replacements are noted. Globes and orbits are otherwise unremarkable. IMPRESSION: 1. No acute intracranial abnormality or significant interval change. 2. Stable atrophy and white matter disease. 3. Remote lacunar infarcts of the right centrum semi ovale and bilateral cerebellum. Electronically Signed   By: Marin Robertshristopher  Mattern M.D.   On: 05/30/2020 15:06        Scheduled Meds: . calcitonin (salmon)  1 spray Alternating Nares Daily  . calcium carbonate  1 tablet Oral Q breakfast  . cholecalciferol  1,000 Units Oral Q breakfast  . digoxin  0.125 mg Intravenous QODAY  . donepezil  5 mg Oral QHS  . DULoxetine  60 mg Oral Q breakfast  . enoxaparin (LOVENOX) injection  40 mg Subcutaneous Q24H  . HYDROcodone-acetaminophen  1 tablet Oral BID  . latanoprost  1 drop Both Eyes QHS  . levETIRAcetam  500 mg Oral BID  . pantoprazole (PROTONIX) IV  40 mg Intravenous Q24H  . potassium chloride  40 mEq Oral Once  . pregabalin  50 mg Oral BID  .  timolol  1 drop Both Eyes Daily  . traZODone  25 mg Oral QPM   Continuous Infusions: .  ceFAZolin (ANCEF) IV 1 g (05/31/20 0555)  . lactated ringers with kcl       LOS: 3 days    Time spent: 35 minutes    Ed Mandich Hoover Brunette Lounette Sloan, DO [facility]  If 7PM-7AM, please contact night-coverage [email or web address] 05/31/2020, 10:28 AM

## 2020-05-31 NOTE — Progress Notes (Signed)
  Speech Language Pathology Treatment: Dysphagia  Patient Details Name: Kendra Smith MRN: [id number] DOB: 09/23/1926 Today's Date: 05/31/2020 Time: 5427-0623 SLP Time Calculation (min) (ACUTE ONLY): 12 min  Assessment / Plan / Recommendation Clinical Impression  Ms. Yeakel had recently finished her lunch meal upon SLP arrival and was complaining of a stomachache. She was afraid she had eaten too much. (100% of meal tray gone). She reported liking her meal of Dys 2 roast beef. SLP provided ginger ale, which pt consumed via straw with no s/s aspiration. She was reluctant to trial solids due to her stomachache, but agreed to have one bite of graham cracker. She had functional mastication and oral clearance, but remains quite fatigued. Recommend upgrade to Dys 3 and continue thin liquids. ST service to monitor for tolerance of upgraded diet and upgrade further to regular solids if clinically indicated. Continue meds whole in puree and 1:1 A for all PO intake.    HPI HPI: Omolola Mittman is a 86 y.o. female with medical history significant of dementia, hypertension, hyperlipidemia, paroxysmal A. fib, admitted with seizure-like activity (new onset). Head CT atrophy and chronic small-vessel ischemic changes. Question hyperdense right MCA branches in the sylvian fissure. Pt could not remain still for MRI. Leukocytosis suspected due to UTI per MD note.      SLP Plan  Continue with current plan of care       Recommendations  Diet recommendations: Dysphagia 3 (mechanical soft);Thin liquid Liquids provided via: Straw Medication Administration: Whole meds with puree Compensations: Minimize environmental distractions;Slow rate;Small sips/bites;Lingual sweep for clearance of pocketing Postural Changes and/or Swallow Maneuvers: Seated upright 90 degrees;Upright 30-60 min after meal                Oral Care Recommendations: Oral care BID Follow up Recommendations: None SLP Visit Diagnosis:  Dysphagia, unspecified (R13.10) Plan: Continue with current plan of care                    Tanajah Boulter P. Mileah Hemmer, M.S., CCC-SLP Speech-Language Pathologist Acute Rehabilitation Services Pager: [phone]   Susanne Borders Junnie Loschiavo 05/31/2020, 2:17 PM

## 2020-05-31 NOTE — Progress Notes (Signed)
PHARMACY NOTE:  ANTIMICROBIAL RENAL DOSAGE ADJUSTMENT  Current antimicrobial regimen includes a mismatch between antimicrobial dosage and estimated renal function.  As per policy approved by the Pharmacy & Therapeutics and Medical Executive Committees, the antimicrobial dosage will be adjusted accordingly.  Current antimicrobial dosage:  Cefazolin 1g IV every 8 hours  Indication: UTI  Renal Function:  Estimated Creatinine Clearance: 26.6 mL/min (A) (by C-G formula based on SCr of 1.14 mg/dL (H)). []      On intermittent HD, scheduled: []      On CRRT    Antimicrobial dosage has been changed to:  Cefazolin 1g IV every 12 hours  Additional comments:   , PharmD, BCPS Clinical Pharmacist 05/31/2020 2:43 PM

## 2020-06-01 DIAGNOSIS — Z7401 Bed confinement status: Secondary | ICD-10-CM | POA: Diagnosis not present

## 2020-06-01 DIAGNOSIS — G40901 Epilepsy, unspecified, not intractable, with status epilepticus: Secondary | ICD-10-CM | POA: Diagnosis not present

## 2020-06-01 DIAGNOSIS — K219 Gastro-esophageal reflux disease without esophagitis: Secondary | ICD-10-CM | POA: Diagnosis not present

## 2020-06-01 DIAGNOSIS — I10 Essential (primary) hypertension: Secondary | ICD-10-CM | POA: Diagnosis not present

## 2020-06-01 DIAGNOSIS — F329 Major depressive disorder, single episode, unspecified: Secondary | ICD-10-CM | POA: Diagnosis not present

## 2020-06-01 DIAGNOSIS — W19XXXD Unspecified fall, subsequent encounter: Secondary | ICD-10-CM | POA: Diagnosis not present

## 2020-06-01 DIAGNOSIS — D649 Anemia, unspecified: Secondary | ICD-10-CM | POA: Diagnosis not present

## 2020-06-01 DIAGNOSIS — M62830 Muscle spasm of back: Secondary | ICD-10-CM | POA: Diagnosis not present

## 2020-06-01 DIAGNOSIS — Z03818 Encounter for observation for suspected exposure to other biological agents ruled out: Secondary | ICD-10-CM | POA: Diagnosis not present

## 2020-06-01 DIAGNOSIS — S329XXA Fracture of unspecified parts of lumbosacral spine and pelvis, initial encounter for closed fracture: Secondary | ICD-10-CM | POA: Diagnosis not present

## 2020-06-01 DIAGNOSIS — D508 Other iron deficiency anemias: Secondary | ICD-10-CM | POA: Diagnosis not present

## 2020-06-01 DIAGNOSIS — M2550 Pain in unspecified joint: Secondary | ICD-10-CM | POA: Diagnosis not present

## 2020-06-01 DIAGNOSIS — G4089 Other seizures: Secondary | ICD-10-CM | POA: Diagnosis not present

## 2020-06-01 DIAGNOSIS — I4891 Unspecified atrial fibrillation: Secondary | ICD-10-CM | POA: Diagnosis not present

## 2020-06-01 DIAGNOSIS — F331 Major depressive disorder, recurrent, moderate: Secondary | ICD-10-CM | POA: Diagnosis not present

## 2020-06-01 DIAGNOSIS — D62 Acute posthemorrhagic anemia: Secondary | ICD-10-CM | POA: Diagnosis not present

## 2020-06-01 DIAGNOSIS — I480 Paroxysmal atrial fibrillation: Secondary | ICD-10-CM | POA: Diagnosis not present

## 2020-06-01 DIAGNOSIS — F0391 Unspecified dementia with behavioral disturbance: Secondary | ICD-10-CM | POA: Diagnosis not present

## 2020-06-01 DIAGNOSIS — N3000 Acute cystitis without hematuria: Secondary | ICD-10-CM | POA: Diagnosis not present

## 2020-06-01 DIAGNOSIS — S32501D Unspecified fracture of right pubis, subsequent encounter for fracture with routine healing: Secondary | ICD-10-CM | POA: Diagnosis not present

## 2020-06-01 DIAGNOSIS — R4182 Altered mental status, unspecified: Secondary | ICD-10-CM | POA: Diagnosis not present

## 2020-06-01 DIAGNOSIS — E876 Hypokalemia: Secondary | ICD-10-CM | POA: Diagnosis not present

## 2020-06-01 DIAGNOSIS — Z20822 Contact with and (suspected) exposure to covid-19: Secondary | ICD-10-CM | POA: Diagnosis not present

## 2020-06-01 DIAGNOSIS — U071 COVID-19: Secondary | ICD-10-CM | POA: Diagnosis not present

## 2020-06-01 DIAGNOSIS — M810 Age-related osteoporosis without current pathological fracture: Secondary | ICD-10-CM | POA: Diagnosis not present

## 2020-06-01 DIAGNOSIS — F0390 Unspecified dementia without behavioral disturbance: Secondary | ICD-10-CM | POA: Diagnosis not present

## 2020-06-01 DIAGNOSIS — E785 Hyperlipidemia, unspecified: Secondary | ICD-10-CM | POA: Diagnosis not present

## 2020-06-01 DIAGNOSIS — H409 Unspecified glaucoma: Secondary | ICD-10-CM | POA: Diagnosis not present

## 2020-06-01 DIAGNOSIS — R404 Transient alteration of awareness: Secondary | ICD-10-CM | POA: Diagnosis not present

## 2020-06-01 DIAGNOSIS — E559 Vitamin D deficiency, unspecified: Secondary | ICD-10-CM | POA: Diagnosis not present

## 2020-06-01 LAB — BASIC METABOLIC PANEL
Anion gap: 8 (ref 5–15)
BUN: 11 mg/dL (ref 8–23)
CO2: 28 mmol/L (ref 22–32)
Calcium: 8.6 mg/dL — ABNORMAL LOW (ref 8.9–10.3)
Chloride: 103 mmol/L (ref 98–111)
Creatinine, Ser: 1.04 mg/dL — ABNORMAL HIGH (ref 0.44–1.00)
GFR calc Af Amer: 54 mL/min — ABNORMAL LOW (ref >60)
GFR calc non Af Amer: 47 mL/min — ABNORMAL LOW (ref >60)
Glucose, Bld: 112 mg/dL — ABNORMAL HIGH (ref 70–99)
Potassium: 4.5 mmol/L (ref 3.5–5.1)
Sodium: 139 mmol/L (ref 135–145)

## 2020-06-01 LAB — CBC
HCT: 32.2 % — ABNORMAL LOW (ref 36.0–46.0)
Hemoglobin: 9.7 g/dL — ABNORMAL LOW (ref 12.0–15.0)
MCH: 28.9 pg (ref 26.0–34.0)
MCHC: 30.1 g/dL (ref 30.0–36.0)
MCV: 95.8 fL (ref 80.0–100.0)
Platelets: 297 10*3/uL (ref 150–400)
RBC: 3.36 MIL/uL — ABNORMAL LOW (ref 3.87–5.11)
RDW: 17.1 % — ABNORMAL HIGH (ref 11.5–15.5)
WBC: 10.2 10*3/uL (ref 4.0–10.5)
nRBC: 0 % (ref 0.0–0.2)

## 2020-06-01 LAB — SARS CORONAVIRUS 2 BY RT PCR (HOSPITAL ORDER, PERFORMED IN ~~LOC~~ HOSPITAL LAB): SARS Coronavirus 2: NEGATIVE

## 2020-06-01 LAB — MAGNESIUM: Magnesium: 2 mg/dL (ref 1.7–2.4)

## 2020-06-01 MED ORDER — PANTOPRAZOLE SODIUM 40 MG PO TBEC: 40.0000 mg | DELAYED_RELEASE_TABLET | Freq: Every day | ORAL | Status: DC

## 2020-06-01 MED ORDER — HYDROCODONE-ACETAMINOPHEN 5-325 MG PO TABS: 1.0000 | ORAL_TABLET | ORAL | 0 refills | Status: DC

## 2020-06-01 MED ORDER — TRAMADOL HCL 50 MG PO TABS: 50.0000 mg | ORAL_TABLET | Freq: Three times a day (TID) | ORAL | 0 refills | Status: DC | PRN

## 2020-06-01 MED ORDER — CEPHALEXIN 250 MG PO CAPS: 250.0000 mg | ORAL_CAPSULE | Freq: Three times a day (TID) | ORAL | 0 refills | Status: DC

## 2020-06-01 MED ORDER — ADULT MULTIVITAMIN W/MINERALS CH: 1.0000 | ORAL_TABLET | Freq: Every day | ORAL | Status: DC

## 2020-06-01 MED ORDER — ENSURE ENLIVE PO LIQD: 237.0000 mL | Freq: Two times a day (BID) | ORAL | Status: DC

## 2020-06-01 MED ORDER — LEVETIRACETAM 500 MG PO TABS: 500.0000 mg | ORAL_TABLET | Freq: Two times a day (BID) | ORAL | 0 refills | Status: DC

## 2020-06-01 NOTE — Progress Notes (Signed)
PHARMACIST - PHYSICIAN COMMUNICATION DR:   Sherryll Burger CONCERNING: Protonix IV to Oral Route Change Policy  RECOMMENDATION: This patient is receiving Protonix by the intravenous route.  Based on criteria approved by the Pharmacy and Therapeutics Committee, this drug is being converted to the equivalent oral dose form(s).  DESCRIPTION: These criteria include:  The patient is eating (either orally or via tube) and/or has been taking other orally administered medications for a least 24 hours  There is no active GI bleed or impaired GI absorption noted.   If you have questions about this conversion, please contact the Pharmacy Department  []   [phone] )  ( [phone] [x]   [phone] )   []   [phone] )  [facility] []   [phone] )  [facility]    Thank you for allowing pharmacy to be a part of this patient's care.  [facility], PharmD, BCPS Clinical Pharmacist 06/01/2020 1:23 PM

## 2020-06-01 NOTE — NC FL2 (Signed)
[place] MEDICAID FL2 LEVEL OF CARE SCREENING TOOL     IDENTIFICATION  Patient Name: Kendra Smith Birthdate: [birth year] Sex: female Admission Date (Current Location): 05/28/2020  [facility] and [place] Number:  Producer, television/film/video and Address:  [facility] [place], [address] [place], [place] [address]      Provider Number: [id number]  Attending Physician Name and Address:  Erick Blinks, DO  Relative Name and Phone Number:  [name] [phone]    Current Level of Care: Hospital Recommended Level of Care: Skilled Nursing Facility Prior Approval Number:    Date Approved/Denied:   PASRR Number: [id number]  Discharge Plan: SNF    Current Diagnoses: Patient Active Problem List   Diagnosis Date Noted  . Seizure (HCC) 05/28/2020  . Weakness generalized   . GI bleed 08/30/2019  . UTI (urinary tract infection) 08/30/2019  . Hypokalemia 08/30/2019  . Demand ischemia (HCC) 08/30/2019  . Palliative care by specialist   . COVID-19   . DNR (do not resuscitate)   . Adult failure to thrive   . Left acetabular fracture (HCC) 08/23/2019  . Pelvic rami fracture (HCC) 08/23/2019  . PAF (paroxysmal atrial fibrillation) (HCC) 08/23/2019  . Rib fracture-12 01/12/2019  . Fracture of transverse process of lumbar vertebra L1-L2 01/12/2019  . Dementia (HCC) 01/12/2019  . Fall 01/12/2019  . Leukocytosis 01/12/2019  . Depression   . Hypertension     Orientation RESPIRATION BLADDER Height & Weight     Self, Place  Normal Incontinent, External catheter Weight: 59.6 kg Height:  5\' 3"  (160 cm)  BEHAVIORAL SYMPTOMS/MOOD NEUROLOGICAL BOWEL NUTRITION STATUS  Other (Comment) (calm / cooperative) Frequency (Admitted with seizure like activity tonic clonic seizures) Incontinent Diet (Dysphagia)  AMBULATORY STATUS COMMUNICATION OF NEEDS Skin   Limited Assist Verbally Other (Comment) (dry flaky bilateral arms, MASD buttocks /groin barrier cream, ecoriation to  perineum/buttocks right posterior barrier cream)                       Personal Care Assistance Level of Assistance  Bathing, Feeding, Dressing, Total care Bathing Assistance: Limited assistance Feeding assistance: Independent (after set up) Dressing Assistance: Limited assistance Total Care Assistance: Limited assistance   Functional Limitations Info  Sight, Speech, Hearing Sight Info: Adequate Hearing Info: Adequate Speech Info: Adequate    SPECIAL CARE FACTORS FREQUENCY  PT (By licensed PT), OT (By licensed OT)     PT Frequency: 5X OT Frequency: 5X            Contractures Contractures Info: Not present    Additional Factors Info  Code Status, Allergies, Psychotropic, Insulin Sliding Scale, Isolation Precautions, Suctioning Needs Code Status Info: DNR Allergies Info: Ciprofloxacin Hcl, Effexor Venlafaxine, Gantrisin Sulfisoxazole Levaquin Levofloxacin, Sulfa Antibiotics Psychotropic Info: cymbalta, haldol,  trazadone, hx of depression, dementia Insulin Sliding Scale Info: n/a Isolation Precautions Info: n/a Suctioning Needs: n/a   Current Medications (06/01/2020):  This is the current hospital active medication list Current Facility-Administered Medications  Medication Dose Route Frequency Provider Last Rate Last Admin  . calcitonin (salmon) (MIACALCIN/FORTICAL) nasal spray 1 spray  1 spray Alternating Nares Daily 06/03/2020, MD   1 spray at 06/01/20 0844  . calcium carbonate (OS-CAL - dosed in mg of elemental calcium) tablet 500 mg of elemental calcium  1 tablet Oral Q breakfast 06/03/20, MD   500 mg of elemental calcium at 06/01/20 0844  . ceFAZolin (ANCEF) IVPB 1 g/50 mL premix  1 g Intravenous Q12H Maurilio Lovely D, DO 100 mL/hr at 06/01/20 0848 1 g at 06/01/20 0848  . cholecalciferol (VITAMIN D3) tablet 1,000 Units  1,000 Units Oral Q breakfast Leatha Gilding, MD   1,000 Units at 06/01/20 0844  . digoxin (LANOXIN) 0.25 MG/ML injection 0.125 mg   0.125 mg Intravenous Anastasio Champion, FNP   0.125 mg at 06/01/20 0848  . donepezil (ARICEPT) tablet 5 mg  5 mg Oral QHS Leatha Gilding, MD   5 mg at 05/31/20 2231  . DULoxetine (CYMBALTA) DR capsule 60 mg  60 mg Oral Q breakfast Leatha Gilding, MD   60 mg at 06/01/20 0844  . enoxaparin (LOVENOX) injection 30 mg  30 mg Subcutaneous Q24H Sherryll Burger, Pratik D, DO   30 mg at 05/31/20 2232  . feeding supplement (ENSURE ENLIVE) (ENSURE ENLIVE) liquid 237 mL  237 mL Oral BID BM Shah, Pratik D, DO      . haloperidol lactate (HALDOL) injection 1 mg  1 mg Intravenous Q6H PRN Marlin Canary U, DO   1 mg at 06/01/20 0845  . HYDROcodone-acetaminophen (NORCO/VICODIN) 5-325 MG per tablet 1 tablet  1 tablet Oral BID Leatha Gilding, MD   1 tablet at 06/01/20 0844  . latanoprost (XALATAN) 0.005 % ophthalmic solution 1 drop  1 drop Both Eyes QHS Leatha Gilding, MD   1 drop at 05/31/20 2233  . levETIRAcetam (KEPPRA) tablet 500 mg  500 mg Oral BID Sherryll Burger, Pratik D, DO   500 mg at 06/01/20 0844  . multivitamin with minerals tablet 1 tablet  1 tablet Oral Daily Sherryll Burger, Pratik D, DO      . pantoprazole (PROTONIX) EC tablet 40 mg  40 mg Oral Daily Shah, Pratik D, DO      . pregabalin (LYRICA) capsule 50 mg  50 mg Oral BID Leatha Gilding, MD   50 mg at 06/01/20 0844  . timolol (TIMOPTIC) 0.5 % ophthalmic solution 1 drop  1 drop Both Eyes Daily Leatha Gilding, MD   1 drop at 06/01/20 0845  . traZODone (DESYREL) tablet 25 mg  25 mg Oral QPM Leatha Gilding, MD   25 mg at 05/31/20 2231     Discharge Medications: Please see discharge summary for a list of discharge medications.  Relevant Imaging Results:  Relevant Lab Results:   Additional Information SS# [id number]  Beckie Busing, RN

## 2020-06-01 NOTE — Discharge Summary (Signed)
Physician Discharge Summary  Kendra Smith BJS:[id number] DOB: 84/07/1926 DOA: 05/28/2020  PCP: Patient, No Pcp Per  Admit date: 05/28/2020  Discharge date: 06/01/2020  Admitted From:SNF  Disposition:  SNF  Recommendations for Outpatient Follow-up:  1. Follow up with PCP in 1-2 weeks and repeat BMP in 1 week 2. Please arrange follow up with Neurology outpatient in 4-6 weeks 3. Continue on Keppra as prescribed 4. Continue on Keflex as prescribed to complete course of treatment for UTI  Home Health:None  Equipment/Devices:None  Discharge Condition:Stable  CODE STATUS: DNR  Diet recommendation: Heart Healthy/dysphagia 3  Brief/Interim Summary: Kendra Smith an 86 y.o.female(from SNF: [facility])(from SNF: [facility])with medical history significant ofdementia, hypertension, hyperlipidemia, paroxysmal A. fib, came into the hospital brought by her nursing home due to seizure-like activity. Patient is currently agitated and with underlying dementia cannot contribute to the story.History is per EDP, the nursing facility reported that patient had sudden onset of generalized tonic-clonic seizure lasting approximately a minute. It broke away on its own however had recurrent seizure that lasted about 30 seconds. EMS arrived and patient was found to be postictal with left-sided hemiparesis and gaze to the left. She had a third seizure in the ED. Neurology has been consulted and she was loaded with Keppra.There is no reported history of seizures.Thought to be in status epilepticus that has now been broken. MRI performed with no acute findings.  Status epilepticus-resolved; new onset -She underwent a CT scan of the head code stroke due to left-sided hemiparesis with questionable hyperdense right MCA branches in the sylvian fissure. Neurology recommended MRIto r/o cva vs todd's paralysishowever she continues to not be able to hold still -Continue Keppra 500mg  now p.o. twice daily -Brain MRI  without any significant findings, discussed with neurology with plans to continue same management for now. -Dysphagia 3 diet  Leukocytosis-improving -suspected due to UTI -see below  Paroxysmal A. fib-patient not on anticoagulation due to suspect lower GI bleed in November 2020, also doubt she is a good candidate due to underlying dementia. Continue digoxin.  AKI -resolved  Advanced dementia -more agitated afterAtivan but improved with haldol  UTI-Klebsiella pneumoniae -Finish treatment with Keflex as prescribed  Hypokalemia-resolved -Monitor with repeat bmp outpatient  Discharge Diagnoses:  Active Problems:   Dementia (HCC)   UTI (urinary tract infection)   DNR (do not resuscitate)   Seizure [facility])    Discharge Instructions  Discharge Instructions    Diet - low sodium heart healthy   Complete by: As directed    Increase activity slowly   Complete by: As directed      Allergies as of 06/01/2020      Reactions   Ciprofloxacin Hcl Other (See Comments)   On MAR   Effexor [venlafaxine] Other (See Comments)   On MAR   Gantrisin [sulfisoxazole] Other (See Comments)   On MAR   Levaquin [levofloxacin] Other (See Comments)   On MAR   Sulfa Antibiotics Rash      Medication List    TAKE these medications   acetaminophen 500 MG tablet Commonly known as: TYLENOL Take 1,000 mg by mouth every 6 (six) hours as needed for mild pain, fever or headache.   calcitonin (salmon) 200 UNIT/ACT nasal spray Commonly known as: MIACALCIN/FORTICAL Place 1 spray into alternate nostrils daily.   calcium carbonate 1500 (600 Ca) MG Tabs tablet Commonly known as: OSCAL Take 600 mg of elemental calcium by mouth daily with breakfast.   cephALEXin 250 MG capsule Commonly known as: KEFLEX Take 1 capsule (250  mg total) by mouth 3 (three) times daily. Take until 06/02/20   cholecalciferol 25 MCG (1000 UNIT) tablet Commonly known as: VITAMIN D3 Take 1,000 Units by mouth daily  with breakfast.   DERMACLOUD EX Apply 1 application topically See admin instructions. Dermacloud ointment- Apply to buttocks after each incontinent episode   digoxin 0.125 MG tablet Commonly known as: LANOXIN Take 0.125 mg by mouth every other day.   donepezil 5 MG tablet Commonly known as: ARICEPT Take 5 mg by mouth at bedtime.   DULoxetine 60 MG capsule Commonly known as: CYMBALTA Take 60 mg by mouth daily with breakfast.   feeding supplement (ENSURE ENLIVE) Liqd Take 237 mLs by mouth 2 (two) times daily between meals.   HYDROcodone-acetaminophen 5-325 MG tablet Commonly known as: NORCO/VICODIN Take 1 tablet by mouth See admin instructions. Take 1 tablet by mouth two times a day (9 AM and 9 PM) and every 12 hours as needed for pain   latanoprost 0.005 % ophthalmic solution Commonly known as: XALATAN Place 1 drop into both eyes at bedtime.   levETIRAcetam 500 MG tablet Commonly known as: KEPPRA Take 1 tablet (500 mg total) by mouth 2 (two) times daily.   NON FORMULARY Take 120 mLs by mouth See admin instructions. MedPass- Drink 120 ml's by mouth two times a day   pantoprazole 40 MG tablet Commonly known as: PROTONIX Take 1 tablet (40 mg total) by mouth daily.   pregabalin 50 MG capsule Commonly known as: LYRICA Take 50 mg by mouth 2 (two) times daily.   timolol 0.5 % ophthalmic solution Commonly known as: TIMOPTIC Place 1 drop into both eyes daily.   traMADol 50 MG tablet Commonly known as: ULTRAM Take 1 tablet (50 mg total) by mouth every 8 (eight) hours as needed for severe pain.   traZODone 50 MG tablet Commonly known as: DESYREL Take 25 mg by mouth every evening.       Allergies  Allergen Reactions  . Ciprofloxacin Hcl Other (See Comments)    On MAR  . Effexor [Venlafaxine] Other (See Comments)    On MAR  . Gantrisin [Sulfisoxazole] Other (See Comments)    On MAR  . Levaquin [Levofloxacin] Other (See Comments)    On MAR  . Sulfa Antibiotics  Rash    Consultations:  Neurology   Procedures/Studies: EEG  Result Date: 05/28/2020 Charlsie Quest, MD     05/28/2020  4:27 PM Patient Name: Kendra Smith MRN: [id number] Epilepsy Attending: Charlsie Quest Referring Physician/Provider: Felicie Morn, PA Date: 05/28/2020 Duration: 20.46 mins Patient history: 86 year old female presenting to [facility] emergency department secondary to tonic-clonic seizures while at nursing home. Another further seizure that was initially partial with secondary generalization while in the ED. EEG to evaluate for seizure. Level of alertness: lethargic AEDs during EEG study: LEV Technical aspects: This EEG study was done with scalp electrodes positioned according to the 10-20 International system of electrode placement. Electrical activity was acquired at a sampling rate of  and reviewed with a high frequency filter of  and a low frequency filter of . EEG data were recorded continuously and digitally stored. Description: EEG showed continuous/intermittent generalized 3 to 6 Hz theta-delta slowing.  Hyperventilation and photic stimulation were not performed.   EEG was technically difficult due to significant movement artifact. ABNORMALITY -Continuous slow, generalized IMPRESSION: This technically difficult study is suggestive of severe diffuse encephalopathy, nonspecific etiology.  No seizures or epileptiform discharges were seen throughout the recording. Priyanka Annabelle Harman  MR BRAIN WO CONTRAST  Result Date: 05/30/2020 CLINICAL DATA:  Neuro deficit, acute, stroke suspected. Code stroke 2 days ago with left-sided weakness and possible seizure. EXAM: MRI HEAD WITHOUT CONTRAST TECHNIQUE: Multiplanar, multiecho pulse sequences of the brain and surrounding structures were obtained without intravenous contrast. COMPARISON:  CT head without contrast 05/28/2020.  05/11/2019 FINDINGS: Brain: Diffusion-weighted images demonstrate no acute or subacute infarction. The  study is moderately degraded by patient motion. Periventricular and subcortical T2 hyperintensities are stable. Remote lacunar infarct of the right centrum semi ovale is stable. Remote lacunar infarcts are present in the thalami bilaterally. Remote infarcts are present the cerebellum bilaterally, left greater than right. No new or acute infarcts are present. Vascular: Flow is present in the major intracranial arteries. Skull and upper cervical spine: The craniocervical junction is normal. Upper cervical spine is within normal limits. Marrow signal is unremarkable. Sinuses/Orbits: The paranasal sinuses and mastoid air cells are clear. Bilateral lens replacements are noted. Globes and orbits are otherwise unremarkable. IMPRESSION: 1. No acute intracranial abnormality or significant interval change. 2. Stable atrophy and white matter disease. 3. Remote lacunar infarcts of the right centrum semi ovale and bilateral cerebellum. Electronically Signed   By: Marin Robertshristopher  Mattern M.D.   On: 05/30/2020 15:06   DG Chest Portable 1 View  Result Date: 05/28/2020 CLINICAL DATA:  Code stroke altered mental status EXAM: PORTABLE CHEST 1 VIEW COMPARISON:  08/30/2019, 08/23/2019, 06/29/2014 chest CT 01/12/2019 FINDINGS: Mildly diminished lung volumes. Probable skin fold artifact over the left lower chest. Cardiomegaly. Minimal atelectasis or scar at the medial left base. Aortic atherosclerosis. No pneumothorax. Slightly irregular left hilar opacity. IMPRESSION: Low lung volumes with atelectasis or scar at the medial left base. Slightly irregular left hilar opacity suspect that this is related to rotation and hilar vessels though recommend short interval two-view chest follow-up to exclude hilar distortion. Electronically Signed   By: Jasmine PangKim  Fujinaga M.D.   On: 05/28/2020 16:47   CT HEAD CODE STROKE WO CONTRAST  Result Date: 05/28/2020 CLINICAL DATA:  Code stroke.  Left-sided weakness.  Seizure. EXAM: CT HEAD WITHOUT CONTRAST  TECHNIQUE: Contiguous axial images were obtained from the base of the skull through the vertex without intravenous contrast. COMPARISON:  08/23/2019 FINDINGS: Brain: Generalized atrophy. Extensive chronic small-vessel ischemic changes of the hemispheric white matter. Old right frontal white matter infarction. No sign of acute infarction, mass lesion, hemorrhage, hydrocephalus or extra-axial collection. Vascular: Question hyperdense right MCA branches in the sylvian fissure. Skull: Negative Sinuses/Orbits: Clear except for opacification of the right division of the sphenoid sinus. Orbits negative. Other: None ASPECTS (Alberta Stroke Program Early CT Score) - Ganglionic level infarction (caudate, lentiform nuclei, internal capsule, insula, M1-M3 cortex): 7 - Supraganglionic infarction (M4-M6 cortex): 3 Total score (0-10 with 10 being normal): 10 IMPRESSION: 1. Atrophy and chronic small-vessel ischemic changes. Question hyperdense right MCA branches in the sylvian fissure. 2. ASPECTS is 10 3. These results were communicated to Dr. Laurence SlateAroor at 3:08 pmon 7/19/2021by text page via the [facility] messaging system. Electronically Signed   By: Paulina FusiMark  Shogry M.D.   On: 05/28/2020 15:09     Discharge Exam: Vitals:   06/01/20 0818 06/01/20 1213  BP: (!) 134/61 (!) 132/59  Pulse: 92 86  Resp:    Temp: 97.6 F (36.4 C)   SpO2: 96% 98%   Vitals:   05/31/20 1145 05/31/20 1637 06/01/20 0818 06/01/20 1213  BP: (!) 149/65 (!) 136/69 (!) 134/61 (!) 132/59  Pulse: 84 93 92 86  Resp: 17 18    Temp: 97.6 F (36.4 C) 97.6 F (36.4 C) 97.6 F (36.4 C)   TempSrc:   Oral   SpO2: 98% 99% 96% 98%  Weight:      Height:        General: Pt is alert, awake, not in acute distress Cardiovascular: RRR, S1/S2 +, no rubs, no gallops Respiratory: CTA bilaterally, no wheezing, no rhonchi Abdominal: Soft, NT, ND, bowel sounds + Extremities: no edema, no cyanosis    The results of significant diagnostics from this hospitalization  (including imaging, microbiology, ancillary and laboratory) are listed below for reference.     Microbiology: Recent Results (from the past 240 hour(s))  SARS Coronavirus 2 by RT PCR (hospital order, performed in [facility] hospital lab) Nasopharyngeal Nasopharyngeal Swab     Status: None   Collection Time: 05/28/20  3:07 PM   Specimen: Nasopharyngeal Swab  Result Value Ref Range Status   SARS Coronavirus 2 NEGATIVE NEGATIVE Final    Comment: (NOTE) SARS-CoV-2 target nucleic acids are NOT DETECTED.  The SARS-CoV-2 RNA is generally detectable in upper and lower respiratory specimens during the acute phase of infection. The lowest concentration of SARS-CoV-2 viral copies this assay can detect is 250 copies / mL. A negative result does not preclude SARS-CoV-2 infection and should not be used as the sole basis for treatment or other patient management decisions.  A negative result may occur with improper specimen collection / handling, submission of specimen other than nasopharyngeal swab, presence of viral mutation(s) within the areas targeted by this assay, and inadequate number of viral copies (<250 copies / mL). A negative result must be combined with clinical observations, patient history, and epidemiological information.  Fact Sheet for Patients:   BoilerBrush.com.cy  Fact Sheet for Healthcare Providers: https://pope.com/  This test is not yet approved or  cleared by the [place] FDA and has been authorized for detection and/or diagnosis of SARS-CoV-2 by FDA under an Emergency Use Authorization (EUA).  This EUA will remain in effect (meaning this test can be used) for the duration of the COVID-19 declaration under Section 564(b)(1) of the Act, 21 U.S.C. section 360bbb-3(b)(1), unless the authorization is terminated or revoked sooner.  Performed at [facility], [address], [place], [place] [address]   Urine  culture     Status: Abnormal   Collection Time: 05/28/20  6:53 PM   Specimen: Urine, Random  Result Value Ref Range Status   Specimen Description URINE, RANDOM  Final   Special Requests   Final    NONE Performed at [facility], [address], [place], [place] [address]    Culture >=100,000 COLONIES/mL KLEBSIELLA PNEUMONIAE (A)  Final   Report Status 05/30/2020 FINAL  Final   Organism ID, Bacteria KLEBSIELLA PNEUMONIAE (A)  Final      Susceptibility   Klebsiella pneumoniae - MIC*    AMPICILLIN >=32 RESISTANT Resistant     CEFAZOLIN <=4 SENSITIVE Sensitive     CEFTRIAXONE <=0.25 SENSITIVE Sensitive     CIPROFLOXACIN <=0.25 SENSITIVE Sensitive     GENTAMICIN <=1 SENSITIVE Sensitive     IMIPENEM <=0.25 SENSITIVE Sensitive     NITROFURANTOIN <=16 SENSITIVE Sensitive     TRIMETH/SULFA <=20 SENSITIVE Sensitive     AMPICILLIN/SULBACTAM 4 SENSITIVE Sensitive     PIP/TAZO <=4 SENSITIVE Sensitive     * >=100,000 COLONIES/mL KLEBSIELLA PNEUMONIAE  SARS Coronavirus 2 by RT PCR (hospital order, performed in [facility] hospital lab)  Nasopharyngeal Nasopharyngeal Swab     Status: None   Collection Time: 06/01/20 10:39 AM   Specimen: Nasopharyngeal Swab  Result Value Ref Range Status   SARS Coronavirus 2 NEGATIVE NEGATIVE Final    Comment: (NOTE) SARS-CoV-2 target nucleic acids are NOT DETECTED.  The SARS-CoV-2 RNA is generally detectable in upper and lower respiratory specimens during the acute phase of infection. The lowest concentration of SARS-CoV-2 viral copies this assay can detect is 250 copies / mL. A negative result does not preclude SARS-CoV-2 infection and should not be used as the sole basis for treatment or other patient management decisions.  A negative result may occur with improper specimen collection / handling, submission of specimen other than nasopharyngeal swab, presence of viral mutation(s) within the areas targeted by this assay, and inadequate number of viral  copies (<250 copies / mL). A negative result must be combined with clinical observations, patient history, and epidemiological information.  Fact Sheet for Patients:   BoilerBrush.com.cy  Fact Sheet for Healthcare Providers: https://pope.com/  This test is not yet approved or  cleared by the [place] FDA and has been authorized for detection and/or diagnosis of SARS-CoV-2 by FDA under an Emergency Use Authorization (EUA).  This EUA will remain in effect (meaning this test can be used) for the duration of the COVID-19 declaration under Section 564(b)(1) of the Act, 21 U.S.C. section 360bbb-3(b)(1), unless the authorization is terminated or revoked sooner.  Performed at [facility], [address] [place], [place] [address]      Labs: BNP (last 3 results) Recent Labs    08/30/19 2032  BNP 333.3*   Basic Metabolic Panel: Recent Labs  Lab 05/28/20 1445 05/28/20 1445 05/28/20 1451 05/29/20 0939 05/30/20 0236 05/31/20 0229 06/01/20 1300  NA 142   < > 143 140 140 138 139  K 3.8   < > 3.7 2.8* 3.1* 2.8* 4.5  CL 102   < > 104 97* 100 101 103  CO2 19*  --   --  28 26 25 28   GLUCOSE 201*   < > 194* 111* 89 113* 112*  BUN 12   < > 12 9 8 10 11   CREATININE 1.28*   < > 1.10* 1.05* 0.98 1.14* 1.04*  CALCIUM 9.6  --   --  8.9 8.5* 8.6* 8.6*  MG  --   --   --   --  2.3 2.2 2.0   < > = values in this interval not displayed.   Liver Function Tests: Recent Labs  Lab 05/28/20 1445 05/29/20 0939  AST 25 32  ALT 13 14  ALKPHOS 108 86  BILITOT 0.9 1.0  PROT 7.6 7.1  ALBUMIN 3.1* 2.7*   No results for input(s): LIPASE, AMYLASE in the last 168 hours. No results for input(s): AMMONIA in the last 168 hours. CBC: Recent Labs  Lab 05/28/20 1445 05/28/20 1445 05/28/20 1451 05/29/20 0939 05/30/20 0236 05/31/20 0229 06/01/20 0816  WBC 20.7*  --   --  15.0* 13.5* 10.4 10.2  NEUTROABS 14.6*  --   --   --   --   --    --   HGB 11.8*   < > 12.9 10.8* 11.0* 11.0* 9.7*  HCT 39.9   < > 38.0 34.5* 34.7* 35.2* 32.2*  MCV 95.5  --   --  91.8 89.9 92.9 95.8  PLT 338  --   --  296 244 296 297   < > = values  in this interval not displayed.   Cardiac Enzymes: No results for input(s): CKTOTAL, CKMB, CKMBINDEX, TROPONINI in the last 168 hours. BNP: Invalid input(s): POCBNP CBG: Recent Labs  Lab 05/28/20 1443  GLUCAP 183*   D-Dimer No results for input(s): DDIMER in the last 72 hours. Hgb A1c No results for input(s): HGBA1C in the last 72 hours. Lipid Profile No results for input(s): CHOL, HDL, LDLCALC, TRIG, CHOLHDL, LDLDIRECT in the last 72 hours. Thyroid function studies No results for input(s): TSH, T4TOTAL, T3FREE, THYROIDAB in the last 72 hours.  Invalid input(s): FREET3 Anemia work up No results for input(s): VITAMINB12, FOLATE, FERRITIN, TIBC, IRON, RETICCTPCT in the last 72 hours. Urinalysis    Component Value Date/Time   COLORURINE STRAW (A) 05/28/2020 1730   APPEARANCEUR CLEAR 05/28/2020 1730   LABSPEC 1.006 05/28/2020 1730   PHURINE 6.0 05/28/2020 1730   GLUCOSEU NEGATIVE 05/28/2020 1730   HGBUR SMALL (A) 05/28/2020 1730   BILIRUBINUR NEGATIVE 05/28/2020 1730   KETONESUR NEGATIVE 05/28/2020 1730   PROTEINUR NEGATIVE 05/28/2020 1730   UROBILINOGEN 1.0 06/22/2011 1224   NITRITE NEGATIVE 05/28/2020 1730   LEUKOCYTESUR LARGE (A) 05/28/2020 1730   Sepsis Labs Invalid input(s): PROCALCITONIN,  WBC,  LACTICIDVEN Microbiology Recent Results (from the past 240 hour(s))  SARS Coronavirus 2 by RT PCR (hospital order, performed in [facility] hospital lab) Nasopharyngeal Nasopharyngeal Swab     Status: None   Collection Time: 05/28/20  3:07 PM   Specimen: Nasopharyngeal Swab  Result Value Ref Range Status   SARS Coronavirus 2 NEGATIVE NEGATIVE Final    Comment: (NOTE) SARS-CoV-2 target nucleic acids are NOT DETECTED.  The SARS-CoV-2 RNA is generally detectable in upper and  lower respiratory specimens during the acute phase of infection. The lowest concentration of SARS-CoV-2 viral copies this assay can detect is 250 copies / mL. A negative result does not preclude SARS-CoV-2 infection and should not be used as the sole basis for treatment or other patient management decisions.  A negative result may occur with improper specimen collection / handling, submission of specimen other than nasopharyngeal swab, presence of viral mutation(s) within the areas targeted by this assay, and inadequate number of viral copies (<250 copies / mL). A negative result must be combined with clinical observations, patient history, and epidemiological information.  Fact Sheet for Patients:   BoilerBrush.com.cy  Fact Sheet for Healthcare Providers: https://pope.com/  This test is not yet approved or  cleared by the [place] FDA and has been authorized for detection and/or diagnosis of SARS-CoV-2 by FDA under an Emergency Use Authorization (EUA).  This EUA will remain in effect (meaning this test can be used) for the duration of the COVID-19 declaration under Section 564(b)(1) of the Act, 21 U.S.C. section 360bbb-3(b)(1), unless the authorization is terminated or revoked sooner.  Performed at [facility], [address] [place], [place] [address]   Urine culture     Status: Abnormal   Collection Time: 05/28/20  6:53 PM   Specimen: Urine, Random  Result Value Ref Range Status   Specimen Description URINE, RANDOM  Final   Special Requests   Final    NONE Performed at [facility], [address] [place], [place] [address]    Culture >=100,000 COLONIES/mL KLEBSIELLA PNEUMONIAE (A)  Final   Report Status 05/30/2020 FINAL  Final   Organism ID, Bacteria KLEBSIELLA PNEUMONIAE (A)  Final      Susceptibility   Klebsiella pneumoniae - MIC*    AMPICILLIN >=  32 RESISTANT Resistant     CEFAZOLIN <=4 SENSITIVE  Sensitive     CEFTRIAXONE <=0.25 SENSITIVE Sensitive     CIPROFLOXACIN <=0.25 SENSITIVE Sensitive     GENTAMICIN <=1 SENSITIVE Sensitive     IMIPENEM <=0.25 SENSITIVE Sensitive     NITROFURANTOIN <=16 SENSITIVE Sensitive     TRIMETH/SULFA <=20 SENSITIVE Sensitive     AMPICILLIN/SULBACTAM 4 SENSITIVE Sensitive     PIP/TAZO <=4 SENSITIVE Sensitive     * >=100,000 COLONIES/mL KLEBSIELLA PNEUMONIAE  SARS Coronavirus 2 by RT PCR (hospital order, performed in [facility] hospital lab) Nasopharyngeal Nasopharyngeal Swab     Status: None   Collection Time: 06/01/20 10:39 AM   Specimen: Nasopharyngeal Swab  Result Value Ref Range Status   SARS Coronavirus 2 NEGATIVE NEGATIVE Final    Comment: (NOTE) SARS-CoV-2 target nucleic acids are NOT DETECTED.  The SARS-CoV-2 RNA is generally detectable in upper and lower respiratory specimens during the acute phase of infection. The lowest concentration of SARS-CoV-2 viral copies this assay can detect is 250 copies / mL. A negative result does not preclude SARS-CoV-2 infection and should not be used as the sole basis for treatment or other patient management decisions.  A negative result may occur with improper specimen collection / handling, submission of specimen other than nasopharyngeal swab, presence of viral mutation(s) within the areas targeted by this assay, and inadequate number of viral copies (<250 copies / mL). A negative result must be combined with clinical observations, patient history, and epidemiological information.  Fact Sheet for Patients:   BoilerBrush.com.cy  Fact Sheet for Healthcare Providers: https://pope.com/  This test is not yet approved or  cleared by the [place] FDA and has been authorized for detection and/or diagnosis of SARS-CoV-2 by FDA under an Emergency Use Authorization (EUA).  This EUA will remain in effect (meaning this test can be used) for the duration of  the COVID-19 declaration under Section 564(b)(1) of the Act, 21 U.S.C. section 360bbb-3(b)(1), unless the authorization is terminated or revoked sooner.  Performed at [facility], [address], [place], [place] [address]      Time coordinating discharge: 35 minutes  SIGNED:   Erick Blinks, DO [facility] 06/01/2020, 2:45 PM  If 7PM-7AM, please contact night-coverage [email or web address]

## 2020-06-01 NOTE — TOC Transition Note (Addendum)
Transition of Care [facility]) - CM/SW Discharge Note   Patient Details  Name: Kendra Smith MRN: [id number] Date of Birth: 04/28/1926  Transition of Care [facility]) CM/SW Contact:  Beckie Busing, RN Phone Number: [phone]  06/01/2020, 3:34 PM   Clinical Narrative:   Patient to be discharged to [facility]. Daughter [name] made aware. PTAR set up for 5pm per [facility] request. Primary nurse made aware. Discharge p[acket complete and at the nurses station. No further d/c needs. TOC will sign off.   Please call report to  [facility] Room # 3225 Call report to [phone]    Final next level of care: Skilled Nursing Facility Barriers to Discharge: No Barriers Identified   Patient Goals and CMS Choice        Discharge Placement   Existing PASRR number confirmed : 06/01/20          Patient chooses bed at: [facility] Patient to be transferred to facility by: PTAR Name of family member notified: [name] daughter Patient and family notified of of transfer: 06/01/20  Discharge Plan and Services                DME Arranged: N/A DME Agency: NA       HH Arranged: NA HH Agency: NA        Social Determinants of Health (SDOH) Interventions     Readmission Risk Interventions No flowsheet data found.

## 2020-06-01 NOTE — Evaluation (Signed)
Occupational Therapy Evaluation Patient Details Name: Kendra Smith MRN: [id number] DOB: [birth year] Today's Date: 06/01/2020    History of Present Illness 86 y.o.femalepresented to the hopsital with seizure-like activity. Pt has a medical history significant ofdementia, hypertension, hyperlipidemia, paroxysmal A. Fib. EEG finding indicated encephalopathy   Clinical Impression   PTA, pt resides at [facility] nursing facility. Pt reports use of RW and assistance with ADLs at baseline. Unsure of PLOF information accuracy as pt with hx of dementia (A&Ox2). Pt presents now with deficits in strength, endurance, and standing balance increasing fall risk during ADLs. Pt demonstrates Min A for bed mobility and transfers. Pt requires up to Mod A for LB ADLs due to deficits. Recommend pt return to SNF and referral to therapy services at SNF if pt below baseline.     Follow Up Recommendations  SNF;Supervision/Assistance - 24 hour    Equipment Recommendations  None recommended by OT    Recommendations for Other Services       Precautions / Restrictions Precautions Precautions: Fall;Other (comment) Precaution Comments: seizures Restrictions Weight Bearing Restrictions: No      Mobility Bed Mobility Overal bed mobility: Needs Assistance Bed Mobility: Supine to Sit;Sit to Supine     Supine to sit: Min assist;HOB elevated Sit to supine: Min assist;HOB elevated   General bed mobility comments: Min A to advance B LE  Transfers Overall transfer level: Needs assistance Equipment used: Rolling walker (2 wheeled) Transfers: Sit to/from Stand Sit to Stand: Min assist         General transfer comment: Min A for sit to stand at bedside with RW, Min A for side steps up to [facility], slow moving and responsive to cues    Balance Overall balance assessment: History of Falls;Needs assistance Sitting-balance support: Feet unsupported;Bilateral upper extremity supported Sitting balance-Leahy  Scale: Fair Sitting balance - Comments: pt required supervision in sitting    Standing balance support: Bilateral upper extremity supported;During functional activity Standing balance-Leahy Scale: Poor Standing balance comment: B UE on RW for support                           ADL either performed or assessed with clinical judgement   ADL Overall ADL's : Needs assistance/impaired Eating/Feeding: Set up;Sitting   Grooming: Set up;Sitting;Wash/dry face Grooming Details (indicate cue type and reason): Setup to wash face, appropriate sequencing Upper Body Bathing: Supervision/ safety;Sitting   Lower Body Bathing: Minimal assistance;Sit to/from stand   Upper Body Dressing : Supervision/safety;Sitting   Lower Body Dressing: Moderate assistance;Sit to/from stand Lower Body Dressing Details (indicate cue type and reason): Pt able to demo ability to reach B feet in bed Toilet Transfer: Minimal assistance;Stand-pivot;BSC;RW Toilet Transfer Details (indicate cue type and reason): simulated at bedside Toileting- Clothing Manipulation and Hygiene: Moderate assistance;Sit to/from stand         General ADL Comments: Pt with decreased strength, endurance and standing balance impacting safety with ADLs     Vision Baseline Vision/History: Wears glasses Patient Visual Report: No change from baseline Vision Assessment?: No apparent visual deficits     Perception     Praxis      Pertinent Vitals/Pain Pain Assessment: Faces Faces Pain Scale: No hurt     Hand Dominance Right   Extremity/Trunk Assessment Upper Extremity Assessment Upper Extremity Assessment: Generalized weakness   Lower Extremity Assessment Lower Extremity Assessment: Defer to PT evaluation   Cervical / Trunk Assessment Cervical / Trunk Assessment: Kyphotic   Communication  Communication Communication: Expressive difficulties   Cognition Arousal/Alertness: Lethargic;Suspect due to medications (Haldol  earlier today) Behavior During Therapy: WFL for tasks assessed/performed Overall Cognitive Status: History of cognitive impairments - at baseline                                 General Comments: pt was oriented x 2, pt didn't know the date or why she was here. She knew her name and that he was at [facility]. Pt had difficulty sequencing with simple commands   General Comments  Pt initially lethargic but easily awakened. Pleasant, though reports of agitation earlier this AM.     Exercises     Shoulder Instructions      Home Living Family/patient expects to be discharged to:: Skilled nursing facility                                 Additional Comments: Pt is at [facility]      Prior Functioning/Environment Level of Independence: Needs assistance  Gait / Transfers Assistance Needed: Pt reports using walker for mobility ADL's / Homemaking Assistance Needed: needs assistance with ADLs   Comments: unsure of accuracy as pt with hx of dementia        OT Problem List: Decreased strength;Decreased activity tolerance;Impaired balance (sitting and/or standing);Decreased cognition;Decreased safety awareness;Decreased knowledge of use of DME or AE      OT Treatment/Interventions: Self-care/ADL training;Therapeutic exercise;Energy conservation;DME and/or AE instruction;Therapeutic activities;Patient/family education    OT Goals(Current goals can be found in the care plan section) Acute Rehab OT Goals Patient Stated Goal: get some rest OT Goal Formulation: With patient Time For Goal Achievement: 06/15/20 Potential to Achieve Goals: Good ADL Goals Pt Will Perform Grooming: with supervision;standing Pt Will Perform Lower Body Bathing: with min guard assist;sit to/from stand Pt Will Perform Lower Body Dressing: with min assist;sit to/from stand Pt Will Transfer to Toilet: with min guard assist;ambulating;bedside commode Pt Will Perform  Toileting - Clothing Manipulation and hygiene: with min guard assist;sit to/from stand  OT Frequency: Min 2X/week   Barriers to D/C:            Co-evaluation              AM-PAC OT "6 Clicks" Daily Activity     Outcome Measure Help from another person eating meals?: A Little Help from another person taking care of personal grooming?: A Little Help from another person toileting, which includes using toliet, bedpan, or urinal?: A Lot Help from another person bathing (including washing, rinsing, drying)?: A Little Help from another person to put on and taking off regular upper body clothing?: A Little Help from another person to put on and taking off regular lower body clothing?: A Lot 6 Click Score: 16   End of Session Equipment Utilized During Treatment: Rolling walker Nurse Communication: Mobility status  Activity Tolerance: Patient limited by lethargy Patient left: in bed;with call bell/phone within reach;with bed alarm set  OT Visit Diagnosis: Unsteadiness on feet (R26.81);Muscle weakness (generalized) (M62.81);History of falling (Z91.81);Other abnormalities of gait and mobility (R26.89)                Time: 8341-9622 OT Time Calculation (min): 14 min Charges:  OT General Charges $OT Visit: 1 Visit OT Evaluation $OT Eval Moderate Complexity: 1 Mod  Lorre Munroe, OTR/L  Lorre Munroe  06/01/2020, 1:02 PM

## 2020-06-01 NOTE — Evaluation (Signed)
Physical Therapy Evaluation Patient Details Name: Kendra Smith MRN: [id number] DOB: 1926/05/28 Today's Date: 06/01/2020   History of Present Illness    86 y.o.femalepresented to the hopsital with seizure-like activity. Pt has a medical history significant ofdementia, hypertension, hyperlipidemia, paroxysmal A. Fib. MRI finding indicated encephalopathy  Clinical Impression  Pt was assessed for the above diagnosis and the listed impairments below. Pt was disoriented to time and situation and required simple one step commands. Pt is min to mod assist with bed mobility. Pt required mod assist in transfers and side stepping at EOB. Recommend return to [facility] for continued skilled care. Pt is an appropriate candidate for continued acute therapy to maximize independence with mobility. Will continue to follow.     Follow Up Recommendations SNF    Equipment Recommendations  None recommended by PT    Recommendations for Other Services       Precautions / Restrictions Precautions Precautions: Fall;Other (comment) (seizures) Restrictions Weight Bearing Restrictions: No      Mobility  Bed Mobility Overal bed mobility: Needs Assistance Bed Mobility: Supine to Sit;Sit to Supine     Supine to sit: Min assist;Mod assist;HOB elevated Sit to supine: Min assist (min assist only at the LEs)   General bed mobility comments: pt required simple one step commands and min to moderate assit at the legs and trunk to get to EOB,   Transfers Overall transfer level: Needs assistance Equipment used: 1 person hand held assist Transfers: Sit to/from Stand Sit to Stand: Mod assist         General transfer comment: pt required UE support on therapist forearms for stability and power up  Ambulation/Gait Ambulation/Gait assistance: Mod assist   Assistive device: 1 person hand held assist Gait Pattern/deviations: Decreased step length - right;Decreased step length - left;Decreased stance time  - right;Decreased stance time - left;Decreased stride length;Decreased dorsiflexion - right;Decreased dorsiflexion - left;Decreased weight shift to right;Decreased weight shift to left Gait velocity: decreased   General Gait Details: Pt was not fully assessed in gait. pt required mod assist with UE support on therapists forearms with side stepping. Pt also demonstrated decreased step initiation.   Stairs            Wheelchair Mobility    Modified Rankin (Stroke Patients Only)       Balance Overall balance assessment: History of Falls;Needs assistance Sitting-balance support: Feet unsupported;Bilateral upper extremity supported Sitting balance-Leahy Scale: Fair Sitting balance - Comments: pt was able to get to EOB with UE support, pt required supervision at trunk for safety  Postural control:  (forward flexed) Standing balance support: Bilateral upper extremity supported Standing balance-Leahy Scale: Poor Standing balance comment: pt required forearm assistance from therapist                              Pertinent Vitals/Pain Pain Assessment: Faces Faces Pain Scale: Hurts a little bit    Home Living Family/patient expects to be discharged to:: Skilled nursing facility                      Prior Function Level of Independence: Needs assistance   Gait / Transfers Assistance Needed: pt stated that she was ambulating with a walker, unsure of accuracy with cognitive deficits and moderate assistance needed with side stepping     Comments: Pt is at [facility]     Hand Dominance  Extremity/Trunk Assessment   Upper Extremity Assessment Upper Extremity Assessment: Generalized weakness    Lower Extremity Assessment Lower Extremity Assessment: Difficult to assess due to impaired cognition;Generalized weakness    Cervical / Trunk Assessment Cervical / Trunk Assessment: Kyphotic (thoracic hyperkyphosis )  Communication    Communication: Expressive difficulties  Cognition Arousal/Alertness: Lethargic Behavior During Therapy: WFL for tasks assessed/performed Overall Cognitive Status: History of cognitive impairments - at baseline                                 General Comments: pt was oriented x 2, pt didn't know the date or why she was here. She knew her name and that he was at [facility] and       General Comments      Exercises     Assessment/Plan    PT Assessment Patent does not need any further PT services  PT Problem List         PT Treatment Interventions      PT Goals (Current goals can be found in the Care Plan section)  Acute Rehab PT Goals Time For Goal Achievement: 06/01/20 Potential to Achieve Goals: Fair    Frequency     Barriers to discharge        Co-evaluation               AM-PAC PT "6 Clicks" Mobility  Outcome Measure Help needed turning from your back to your side while in a flat bed without using bedrails?: A Little Help needed moving from lying on your back to sitting on the side of a flat bed without using bedrails?: A Little Help needed moving to and from a bed to a chair (including a wheelchair)?: A Lot Help needed standing up from a chair using your arms (e.g., wheelchair or bedside chair)?: A Lot Help needed to walk in hospital room?: A Lot Help needed climbing 3-5 steps with a railing? : Total 6 Click Score: 13    End of Session Equipment Utilized During Treatment: Gait belt Activity Tolerance: Treatment limited secondary to medical complications (Comment) Patient left: in bed;with call Buford Bremer/phone within reach;with bed alarm set Nurse Communication: Mobility status PT Visit Diagnosis: Unsteadiness on feet (R26.81);Muscle weakness (generalized) (M62.81);History of falling (Z91.81)    Time: 4627-0350 PT Time Calculation (min) (ACUTE ONLY): 16 min   Charges:             Harmon Pier, SPT  Acute Rehabilitation  Services  Office: [phone]  06/01/2020, 11:54 AM

## 2020-06-01 NOTE — Progress Notes (Signed)
Nutrition Follow-up  DOCUMENTATION CODES:   Not applicable  INTERVENTION:  Ensure Enlive po BID, each supplement provides 350 kcal and 20 grams of protein  MVI daily  NUTRITION DIAGNOSIS:   Inadequate oral intake related to lethargy/confusion as evidenced by NPO status.  Progressing, pt now on Dysphagia 3 diet with thin liquids  GOAL:   Patient will meet greater than or equal to 90% of their needs  Progressing  MONITOR:   PO intake, Supplement acceptance, Weight trends, I & O's, Labs, Diet advancement  REASON FOR ASSESSMENT:   Malnutrition Screening Tool    ASSESSMENT:   Patient with PMH significant for dementia, HTN, HLD, and depression. Presents this admission with seizure-like activity.  7/21 diet upgraded to dysphagia 2 with thin liquids 7/22 diet upgraded to dysphagia 3 with thin liquids  Discussed pt with RN who reports pt did well with breakfast this morning. Pt sleeping at time of RD visit and did not wake to RD voice/exam. Pt to d/c to SNF.   PO intake: 50% x 1recorded meal  Labs: K+ 2.8 (L) Medications: Miacalcin, Os-cal, Vitamin D3, Protonix  Diet Order:   Diet Order            DIET DYS 3 Room service appropriate? Yes; Fluid consistency: Thin  Diet effective now                 EDUCATION NEEDS:   Not appropriate for education at this time  Skin:  Skin Assessment: Reviewed RN Assessment  Last BM:  PTA  Height:   Ht Readings from Last 1 Encounters:  05/30/20 5\' 3"  (1.6 m)    Weight:   Wt Readings from Last 1 Encounters:  05/29/20 59.6 kg   BMI:  Body mass index is 23.28 kg/m.  Estimated Nutritional Needs:   Kcal:  1600-1800 kcal  Protein:  80-95 grams  Fluid:  >/= 1.6 L/day    05/31/20, MS, RD, LDN RD pager number and weekend/on-call pager number located in Amion.

## 2020-06-06 DIAGNOSIS — S329XXA Fracture of unspecified parts of lumbosacral spine and pelvis, initial encounter for closed fracture: Secondary | ICD-10-CM | POA: Diagnosis not present

## 2020-06-06 DIAGNOSIS — I4891 Unspecified atrial fibrillation: Secondary | ICD-10-CM | POA: Diagnosis not present

## 2020-06-06 DIAGNOSIS — F0390 Unspecified dementia without behavioral disturbance: Secondary | ICD-10-CM | POA: Diagnosis not present

## 2020-06-06 DIAGNOSIS — M810 Age-related osteoporosis without current pathological fracture: Secondary | ICD-10-CM | POA: Diagnosis not present

## 2020-06-07 DIAGNOSIS — G4089 Other seizures: Secondary | ICD-10-CM | POA: Diagnosis not present

## 2020-06-07 DIAGNOSIS — F329 Major depressive disorder, single episode, unspecified: Secondary | ICD-10-CM | POA: Diagnosis not present

## 2020-06-07 DIAGNOSIS — M810 Age-related osteoporosis without current pathological fracture: Secondary | ICD-10-CM | POA: Diagnosis not present

## 2020-06-07 DIAGNOSIS — K219 Gastro-esophageal reflux disease without esophagitis: Secondary | ICD-10-CM | POA: Diagnosis not present

## 2020-06-07 DIAGNOSIS — D62 Acute posthemorrhagic anemia: Secondary | ICD-10-CM | POA: Diagnosis not present

## 2020-06-07 DIAGNOSIS — F0390 Unspecified dementia without behavioral disturbance: Secondary | ICD-10-CM | POA: Diagnosis not present

## 2020-06-07 DIAGNOSIS — H409 Unspecified glaucoma: Secondary | ICD-10-CM | POA: Diagnosis not present

## 2020-06-07 DIAGNOSIS — M62830 Muscle spasm of back: Secondary | ICD-10-CM | POA: Diagnosis not present

## 2020-06-07 DIAGNOSIS — I480 Paroxysmal atrial fibrillation: Secondary | ICD-10-CM | POA: Diagnosis not present

## 2020-06-07 DIAGNOSIS — I10 Essential (primary) hypertension: Secondary | ICD-10-CM | POA: Diagnosis not present

## 2020-06-07 DIAGNOSIS — D508 Other iron deficiency anemias: Secondary | ICD-10-CM | POA: Diagnosis not present

## 2020-06-07 DIAGNOSIS — G40901 Epilepsy, unspecified, not intractable, with status epilepticus: Secondary | ICD-10-CM | POA: Diagnosis not present

## 2020-06-20 ENCOUNTER — Other Ambulatory Visit: Payer: Self-pay | Admitting: *Deleted

## 2020-06-20 NOTE — Patient Outreach (Signed)
Member screened for potential [facility] Care Management needs as a benefit of [employer].  Mrs. Pogue is receiving skilled therapy at [facility] SNF. Update received from facility SW indicates transition plan is to return to long term care at facility.   No identifiable [facility] Care Management needs at this time.  Raiford Noble, MSN-Ed, RN,BSN [facility] Post Acute Care Coordinator [phone] [facility]) [phone]  (Toll free office) .

## 2020-06-27 DIAGNOSIS — S329XXA Fracture of unspecified parts of lumbosacral spine and pelvis, initial encounter for closed fracture: Secondary | ICD-10-CM | POA: Diagnosis not present

## 2020-06-27 DIAGNOSIS — I4891 Unspecified atrial fibrillation: Secondary | ICD-10-CM | POA: Diagnosis not present

## 2020-06-27 DIAGNOSIS — F0390 Unspecified dementia without behavioral disturbance: Secondary | ICD-10-CM | POA: Diagnosis not present

## 2020-06-27 DIAGNOSIS — M810 Age-related osteoporosis without current pathological fracture: Secondary | ICD-10-CM | POA: Diagnosis not present

## 2020-07-02 ENCOUNTER — Other Ambulatory Visit: Payer: Self-pay | Admitting: *Deleted

## 2020-07-02 NOTE — Patient Outreach (Signed)
[facility] Post- Acute Care Coordinator follow up.   Update received from [facility] SN[facility] SW. Transition plan is for long term care at [facility]. Likely this week.   No identifiable [facility] needs at this time.  Raiford Noble, MSN-Ed, RN,BSN [facility] Post Acute Care Coordinator [phone] [facility]) [phone]  (Toll free office)

## 2020-07-02 NOTE — Patient Outreach (Signed)
[facility] Post- Acute Care Coordinator follow up. Member screened for potential [facility] Care Management needs as a benefit of [employer].  Per Patient Ping member resides in [facility] SNF.   Communication sent to [facility] SW to request update on transtion plans.   Will continue to follow while member resides in SNF.   Renatta Shrieves, MSN-Ed, RN,BSN [facility] Post Acute Care Coordinator [phone] ( [employer]) [phone]  (Toll free office)  

## 2020-07-09 DIAGNOSIS — Z20828 Contact with and (suspected) exposure to other viral communicable diseases: Secondary | ICD-10-CM | POA: Diagnosis not present

## 2020-07-17 DIAGNOSIS — Z20828 Contact with and (suspected) exposure to other viral communicable diseases: Secondary | ICD-10-CM | POA: Diagnosis not present

## 2020-07-23 DIAGNOSIS — Z20828 Contact with and (suspected) exposure to other viral communicable diseases: Secondary | ICD-10-CM | POA: Diagnosis not present

## 2020-07-24 ENCOUNTER — Emergency Department (HOSPITAL_COMMUNITY)
Admission: EM | Admit: 2020-07-24 | Discharge: 2020-07-25 | Disposition: A | Discharge status: Home / Self Care | Payer: Medicare Other | Attending: Emergency Medicine | Admitting: Emergency Medicine

## 2020-07-24 ENCOUNTER — Emergency Department (HOSPITAL_COMMUNITY): Payer: Medicare Other

## 2020-07-24 ENCOUNTER — Encounter (HOSPITAL_COMMUNITY): Payer: Self-pay

## 2020-07-24 DIAGNOSIS — I2510 Atherosclerotic heart disease of native coronary artery without angina pectoris: Secondary | ICD-10-CM | POA: Diagnosis not present

## 2020-07-24 DIAGNOSIS — R296 Repeated falls: Secondary | ICD-10-CM | POA: Diagnosis not present

## 2020-07-24 DIAGNOSIS — Z79899 Other long term (current) drug therapy: Secondary | ICD-10-CM | POA: Diagnosis not present

## 2020-07-24 DIAGNOSIS — S3992XA Unspecified injury of lower back, initial encounter: Secondary | ICD-10-CM | POA: Diagnosis not present

## 2020-07-24 DIAGNOSIS — Z8616 Personal history of COVID-19: Secondary | ICD-10-CM | POA: Diagnosis not present

## 2020-07-24 DIAGNOSIS — Y9389 Activity, other specified: Secondary | ICD-10-CM | POA: Diagnosis not present

## 2020-07-24 DIAGNOSIS — G9389 Other specified disorders of brain: Secondary | ICD-10-CM | POA: Diagnosis not present

## 2020-07-24 DIAGNOSIS — Y9289 Other specified places as the place of occurrence of the external cause: Secondary | ICD-10-CM | POA: Diagnosis not present

## 2020-07-24 DIAGNOSIS — R52 Pain, unspecified: Secondary | ICD-10-CM | POA: Diagnosis not present

## 2020-07-24 DIAGNOSIS — M48061 Spinal stenosis, lumbar region without neurogenic claudication: Secondary | ICD-10-CM | POA: Diagnosis not present

## 2020-07-24 DIAGNOSIS — S32402A Unspecified fracture of left acetabulum, initial encounter for closed fracture: Secondary | ICD-10-CM | POA: Diagnosis not present

## 2020-07-24 DIAGNOSIS — M545 Low back pain, unspecified: Secondary | ICD-10-CM

## 2020-07-24 DIAGNOSIS — I10 Essential (primary) hypertension: Secondary | ICD-10-CM | POA: Insufficient documentation

## 2020-07-24 DIAGNOSIS — M4807 Spinal stenosis, lumbosacral region: Secondary | ICD-10-CM | POA: Diagnosis not present

## 2020-07-24 DIAGNOSIS — W19XXXA Unspecified fall, initial encounter: Secondary | ICD-10-CM | POA: Diagnosis not present

## 2020-07-24 DIAGNOSIS — M47816 Spondylosis without myelopathy or radiculopathy, lumbar region: Secondary | ICD-10-CM | POA: Diagnosis not present

## 2020-07-24 DIAGNOSIS — S199XXA Unspecified injury of neck, initial encounter: Secondary | ICD-10-CM | POA: Diagnosis not present

## 2020-07-24 DIAGNOSIS — F0390 Unspecified dementia without behavioral disturbance: Secondary | ICD-10-CM | POA: Diagnosis not present

## 2020-07-24 DIAGNOSIS — I700 Atherosclerosis of aorta: Secondary | ICD-10-CM | POA: Diagnosis not present

## 2020-07-24 DIAGNOSIS — S0990XA Unspecified injury of head, initial encounter: Secondary | ICD-10-CM | POA: Diagnosis not present

## 2020-07-24 DIAGNOSIS — M47812 Spondylosis without myelopathy or radiculopathy, cervical region: Secondary | ICD-10-CM | POA: Diagnosis not present

## 2020-07-24 DIAGNOSIS — M5489 Other dorsalgia: Secondary | ICD-10-CM | POA: Diagnosis not present

## 2020-07-24 DIAGNOSIS — I6389 Other cerebral infarction: Secondary | ICD-10-CM | POA: Diagnosis not present

## 2020-07-24 NOTE — ED Triage Notes (Signed)
Pt had an unwitnessed fall and now complains of back pain

## 2020-07-24 NOTE — ED Provider Notes (Signed)
[facility]-EMERGENCY DEPT Provider Note   CSN: [id number] Arrival date & time: 07/24/20  2247     History No chief complaint on file.   Kendra Smith is a 86 y.o. female.  HPI     This is a 86 year old female with a history of glaucoma, hyperlipidemia, hypertension who presents following a fall.  Patient reports that she fell out of her chair.  She denies loss of consciousness.  Per EMS she was having back pain.  She initially denied any complaints to me.  She does report that she hit her head but she is not on any blood thinners.  She denies any leg pain, abdominal pain, nausea, vomiting, syncope, dizziness.    Past Medical History:  Diagnosis Date  . Depression   . Glaucoma   . Hyperlipidemia   . Hypertension   . Osteoporosis     Patient Active Problem List   Diagnosis Date Noted  . Seizure (HCC) 05/28/2020  . Weakness generalized   . GI bleed 08/30/2019  . UTI (urinary tract infection) 08/30/2019  . Hypokalemia 08/30/2019  . Demand ischemia (HCC) 08/30/2019  . Palliative care by specialist   . COVID-19   . DNR (do not resuscitate)   . Adult failure to thrive   . Left acetabular fracture (HCC) 08/23/2019  . Pelvic rami fracture (HCC) 08/23/2019  . PAF (paroxysmal atrial fibrillation) (HCC) 08/23/2019  . Rib fracture-12 01/12/2019  . Fracture of transverse process of lumbar vertebra L1-L2 01/12/2019  . Dementia (HCC) 01/12/2019  . Fall 01/12/2019  . Leukocytosis 01/12/2019  . Depression   . Hypertension     Past Surgical History:  Procedure Laterality Date  . ABDOMINAL HYSTERECTOMY    . ANKLE ARTHROSCOPY    . CHOLECYSTECTOMY    . EYE SURGERY    . RECTOCELE REPAIR       OB History   No obstetric history on file.     History reviewed. No pertinent family history.  Social History   Tobacco Use  . Smoking status: Never Smoker  . Smokeless tobacco: Never Used  Vaping Use  . Vaping Use: Never used  Substance Use Topics  .  Alcohol use: No  . Drug use: No    Home Medications Prior to Admission medications   Medication Sig Start Date End Date Taking? Authorizing Provider  acetaminophen (TYLENOL) 500 MG tablet Take 1,000 mg by mouth every 6 (six) hours as needed for mild pain, fever or headache.     [provider]  calcitonin, salmon, (MIACALCIN/FORTICAL) 200 UNIT/ACT nasal spray Place 1 spray into alternate nostrils daily.    [provider]  calcium carbonate (OSCAL) 1500 (600 Ca) MG TABS tablet Take 600 mg of elemental calcium by mouth daily with breakfast.    [provider]  cephALEXin (KEFLEX) 250 MG capsule Take 1 capsule (250 mg total) by mouth 3 (three) times daily. Take until 06/02/20 06/01/20   Maurilio LovelyShah, Pratik D, DO  cholecalciferol (VITAMIN D3) 25 MCG (1000 UT) tablet Take 1,000 Units by mouth daily with breakfast.     [provider]  digoxin (LANOXIN) 0.125 MG tablet Take 0.125 mg by mouth every other day.     [provider]  donepezil (ARICEPT) 5 MG tablet Take 5 mg by mouth at bedtime. 10/15/18   [provider]  DULoxetine (CYMBALTA) 60 MG capsule Take 60 mg by mouth daily with breakfast.  05/22/14   [provider]  feeding supplement, ENSURE ENLIVE, (ENSURE  ENLIVE) LIQD Take 237 mLs by mouth 2 (two) times daily between meals. Patient not taking: Reported on 05/28/2020 09/01/19   Arrien, York Ram, MD  HYDROcodone-acetaminophen (NORCO/VICODIN) 5-325 MG tablet Take 1 tablet by mouth See admin instructions. Take 1 tablet by mouth two times a day (9 AM and 9 PM) and every 12 hours as needed for pain 06/01/20   Maurilio Lovely D, DO  [employer] [facility] [place]) Apply 1 application topically See admin instructions. Dermacloud ointment- Apply to buttocks after each incontinent episode    [provider]  latanoprost (XALATAN) 0.005 % ophthalmic solution Place 1 drop into both eyes at bedtime.    [provider]    levETIRAcetam (KEPPRA) 500 MG tablet Take 1 tablet (500 mg total) by mouth 2 (two) times daily. 06/01/20 07/01/20  Sherryll Burger, Pratik D, DO  NON FORMULARY Take 120 mLs by mouth See admin instructions. MedPass- Drink 120 ml's by mouth two times a day    [provider]  pantoprazole (PROTONIX) 40 MG tablet Take 1 tablet (40 mg total) by mouth daily. 09/02/19 05/28/20  Arrien, York Ram, MD  pregabalin (LYRICA) 50 MG capsule Take 50 mg by mouth 2 (two) times daily.    [provider]  timolol (TIMOPTIC) 0.5 % ophthalmic solution Place 1 drop into both eyes daily. 06/15/14   [provider]  traMADol (ULTRAM) 50 MG tablet Take 1 tablet (50 mg total) by mouth every 8 (eight) hours as needed for severe pain. 06/01/20   Sherryll Burger, Pratik D, DO  traZODone (DESYREL) 50 MG tablet Take 25 mg by mouth every evening.  10/15/18   [provider]    Allergies    Ciprofloxacin hcl, Effexor [venlafaxine], Gantrisin [sulfisoxazole], Levaquin [levofloxacin], and Sulfa antibiotics  Review of Systems   Review of Systems  Constitutional: Negative for fever.  Respiratory: Negative for shortness of breath.   Cardiovascular: Negative for chest pain.  Gastrointestinal: Negative for abdominal pain and nausea.  Genitourinary: Negative for dysuria.  Musculoskeletal: Negative for back pain.  Neurological: Negative for dizziness, syncope, weakness and numbness.  All other systems reviewed and are negative.   Physical Exam Updated Vital Signs BP (!) 156/73   Pulse 86   Temp 98.2 F (36.8 C) (Oral)   Resp 16   SpO2 97%   Physical Exam Vitals and nursing note reviewed.  Constitutional:      Appearance: She is well-developed.     Comments: Elderly, ABCs intact  HENT:     Head: Normocephalic and atraumatic.     Nose: Nose normal.     Mouth/Throat:     Mouth: Mucous membranes are moist.     Comments: Poor dentition Eyes:     Pupils: Pupils are equal, round, and reactive to light.   Cardiovascular:     Rate and Rhythm: Normal rate and regular rhythm.     Heart sounds: Normal heart sounds.  Pulmonary:     Effort: Pulmonary effort is normal. No respiratory distress.     Breath sounds: No wheezing.  Abdominal:     General: Bowel sounds are normal.     Palpations: Abdomen is soft.  Musculoskeletal:     Cervical back: Neck supple.     Comments: Tenderness to palpation lower lumbar spine, no step-off or deformity noted, no obvious deformities of the bilateral lower extremities, no pain with range of motion, neurovascular intact distally  Skin:    General: Skin is warm and dry.  Neurological:  Mental Status: She is alert.     Comments: Oriented to person and place, not time, 5 out of 5 strength in all 4 extremities  Psychiatric:        Mood and Affect: Mood normal.     ED Results / Procedures / Treatments   Labs (all labs ordered are listed, but only abnormal results are displayed) Labs Reviewed - No data to display  EKG None  Radiology DG Lumbar Spine Complete  Result Date: 07/24/2020 CLINICAL DATA:  Fall EXAM: LUMBAR SPINE - COMPLETE 4+ VIEW COMPARISON:  August 23, 2019 FINDINGS: There is no evidence of lumbar spine fracture, however somewhat limited due to diffuse osteopenia. Degenerative changes seen in the lower lumbar spine most notable at L4-L5 and L5-S1. Scattered dense vascular calcifications are noted. There is chronic comminuted fractures of the left acetabulum with medial protrusion of the femoral head. IMPRESSION: No definite displaced fracture, however limited due to diffuse osteopenia. Electronically Signed   By: Jonna Clark M.D.   On: 07/24/2020 23:59   CT Head Wo Contrast  Result Date: 07/25/2020 CLINICAL DATA:  Unwitnessed fall, neck and back pain, head injury EXAM: CT HEAD WITHOUT CONTRAST CT CERVICAL SPINE WITHOUT CONTRAST TECHNIQUE: Multidetector CT imaging of the head and cervical spine was performed following the standard protocol  without intravenous contrast. Multiplanar CT image reconstructions of the cervical spine were also generated. COMPARISON:  None. FINDINGS: CT HEAD FINDINGS Brain: There is normal anatomic configuration of the brain. Mild parenchymal volume loss is commensurate with the patient's age. Moderate periventricular white matter changes are present likely reflecting the sequela of small vessel ischemia. Remote infarct within the right corona radiata noted. No evidence of acute intracranial hemorrhage or infarct. No abnormal mass effect or midline shift. No abnormal intra or extra-axial mass lesion or fluid collection. Ventricular size is normal. Cerebellum is unremarkable. Vascular: No asymmetric hyperdense vasculature at the skull base. Skull: Intact Sinuses/Orbits: Paranasal sinuses are clear. Orbits are unremarkable. Other: Mastoid air cells and middle ear cavities are clear. CT CERVICAL SPINE FINDINGS Alignment: Normal cervical lordosis.  No listhesis. Skull base and vertebrae: Craniocervical junction is unremarkable. Atlantodental interval is normal. No acute fracture of the cervical spine. Bone island noted within the right T1 pedicle and posterior aspect of the T3 vertebral body. Soft tissues and spinal canal: No prevertebral fluid or swelling. No visible canal hematoma. Disc levels: Review of the sagittal reformats demonstrates preservation of vertebral body heights. There is mild intervertebral disc space narrowing and endplate remodeling at C5-6 in keeping with changes of mild to moderate degenerative disc disease. Remaining intervertebral disc heights are preserved. Review of the axial images demonstrates multilevel uncovertebral and facet arthrosis resulting in multilevel mild-to-moderate neural foraminal narrowing, most severe at C4-5 bilaterally, C5-6 bilaterally and C6-7 on the right. No significant canal stenosis. Upper chest: Negative. Other: None signified IMPRESSION: 1. No evidence of acute intracranial  abnormality. 2. No acute fracture or subluxation of the cervical spine. 3. Mild to moderate degenerative disc disease C5-6. Degenerative joint disease with mild neural foraminal narrowing as described above. Electronically Signed   By: Helyn Numbers MD   On: 07/25/2020 00:25   CT Cervical Spine Wo Contrast  Result Date: 07/25/2020 CLINICAL DATA:  Unwitnessed fall, neck and back pain, head injury EXAM: CT HEAD WITHOUT CONTRAST CT CERVICAL SPINE WITHOUT CONTRAST TECHNIQUE: Multidetector CT imaging of the head and cervical spine was performed following the standard protocol without intravenous contrast. Multiplanar CT image reconstructions of the  cervical spine were also generated. COMPARISON:  None. FINDINGS: CT HEAD FINDINGS Brain: There is normal anatomic configuration of the brain. Mild parenchymal volume loss is commensurate with the patient's age. Moderate periventricular white matter changes are present likely reflecting the sequela of small vessel ischemia. Remote infarct within the right corona radiata noted. No evidence of acute intracranial hemorrhage or infarct. No abnormal mass effect or midline shift. No abnormal intra or extra-axial mass lesion or fluid collection. Ventricular size is normal. Cerebellum is unremarkable. Vascular: No asymmetric hyperdense vasculature at the skull base. Skull: Intact Sinuses/Orbits: Paranasal sinuses are clear. Orbits are unremarkable. Other: Mastoid air cells and middle ear cavities are clear. CT CERVICAL SPINE FINDINGS Alignment: Normal cervical lordosis.  No listhesis. Skull base and vertebrae: Craniocervical junction is unremarkable. Atlantodental interval is normal. No acute fracture of the cervical spine. Bone island noted within the right T1 pedicle and posterior aspect of the T3 vertebral body. Soft tissues and spinal canal: No prevertebral fluid or swelling. No visible canal hematoma. Disc levels: Review of the sagittal reformats demonstrates preservation of  vertebral body heights. There is mild intervertebral disc space narrowing and endplate remodeling at C5-6 in keeping with changes of mild to moderate degenerative disc disease. Remaining intervertebral disc heights are preserved. Review of the axial images demonstrates multilevel uncovertebral and facet arthrosis resulting in multilevel mild-to-moderate neural foraminal narrowing, most severe at C4-5 bilaterally, C5-6 bilaterally and C6-7 on the right. No significant canal stenosis. Upper chest: Negative. Other: None signified IMPRESSION: 1. No evidence of acute intracranial abnormality. 2. No acute fracture or subluxation of the cervical spine. 3. Mild to moderate degenerative disc disease C5-6. Degenerative joint disease with mild neural foraminal narrowing as described above. Electronically Signed   By: Helyn Numbers MD   On: 07/25/2020 00:25   CT Lumbar Spine Wo Contrast  Result Date: 07/25/2020 CLINICAL DATA:  Low back pain with multiple falls EXAM: CT LUMBAR SPINE WITHOUT CONTRAST TECHNIQUE: Multidetector CT imaging of the lumbar spine was performed without intravenous contrast administration. Multiplanar CT image reconstructions were also generated. COMPARISON:  None. FINDINGS: Segmentation: 5 lumbar type vertebrae. Alignment: Grade 1 anterolisthesis at L3-4 and L4-5 Vertebrae: No acute fracture or focal pathologic process. Paraspinal and other soft tissues: Calcific aortic atherosclerosis. Disc levels: No spinal canal or neural foraminal stenosis above the L3 level. L3-4: Grade 1 anterolisthesis due to facet arthrosis. No spinal canal stenosis or neural foraminal stenosis. L4-5: Disc vacuum phenomenon. Moderate facet hypertrophy with grade 1 anterolisthesis. No spinal canal or neural foraminal stenosis. L5-S1: Moderate facet hypertrophy with right asymmetric endplate spurring. Unchanged severe right foraminal stenosis. No spinal canal or left foraminal stenosis. IMPRESSION: 1. No acute fracture of the  lumbar spine. 2. Unchanged severe right L5-S1 foraminal stenosis. Aortic Atherosclerosis (ICD10-I70.0). Electronically Signed   By: Deatra Robinson M.D.   On: 07/25/2020 02:58    Procedures Procedures (including critical care time)  Medications Ordered in ED Medications - No data to display  ED Course  I have reviewed the triage vital signs and the nursing notes.  Pertinent labs & imaging results that were available during my care of the patient were reviewed by me and considered in my medical decision making (see chart for details).    MDM Rules/Calculators/A&P                          Patient presents after a fall.  It was unwitnessed.  She is oriented x2  but able to provide history and reports that it was a mechanical fall out of her chair.  She initially denied pain but when I sat her up she had significant tenderness to palpation over the lower lumbar spine.  For this reason, x-rays were obtained.  CT scan of the head and neck were also obtained as she is high risk.  CT scan of the head and neck are reassuring.  X-rays showed no definitive fracture but significant osteoporosis.  These limit x-ray sensitivity.  For this reason, CT was subsequently obtained.  This does not show any obvious fracture.  Patient ambulated at her baseline with assistance from a walker.  Will discharge back to facility.  After history, exam, and medical workup I feel the patient has been appropriately medically screened and is safe for discharge home. Pertinent diagnoses were discussed with the patient. Patient was given return precautions.  Final Clinical Impression(s) / ED Diagnoses Final diagnoses:  Fall, initial encounter  Acute midline low back pain without sciatica    Rx / DC Orders ED Discharge Orders    None       Albirda Shiel, Mayer Masker, MD 07/25/20 [phone]

## 2020-07-24 NOTE — ED Notes (Signed)
Patient transported to CT 

## 2020-07-25 ENCOUNTER — Emergency Department (HOSPITAL_COMMUNITY): Payer: Medicare Other

## 2020-07-25 DIAGNOSIS — F329 Major depressive disorder, single episode, unspecified: Secondary | ICD-10-CM | POA: Diagnosis not present

## 2020-07-25 DIAGNOSIS — F0390 Unspecified dementia without behavioral disturbance: Secondary | ICD-10-CM | POA: Diagnosis not present

## 2020-07-25 DIAGNOSIS — S199XXA Unspecified injury of neck, initial encounter: Secondary | ICD-10-CM | POA: Diagnosis not present

## 2020-07-25 DIAGNOSIS — I959 Hypotension, unspecified: Secondary | ICD-10-CM | POA: Diagnosis not present

## 2020-07-25 DIAGNOSIS — I10 Essential (primary) hypertension: Secondary | ICD-10-CM | POA: Diagnosis not present

## 2020-07-25 DIAGNOSIS — G9389 Other specified disorders of brain: Secondary | ICD-10-CM | POA: Diagnosis not present

## 2020-07-25 DIAGNOSIS — S0990XA Unspecified injury of head, initial encounter: Secondary | ICD-10-CM | POA: Diagnosis not present

## 2020-07-25 DIAGNOSIS — I700 Atherosclerosis of aorta: Secondary | ICD-10-CM | POA: Diagnosis not present

## 2020-07-25 DIAGNOSIS — W19XXXA Unspecified fall, initial encounter: Secondary | ICD-10-CM | POA: Diagnosis not present

## 2020-07-25 DIAGNOSIS — M545 Low back pain: Secondary | ICD-10-CM | POA: Diagnosis not present

## 2020-07-25 DIAGNOSIS — I6389 Other cerebral infarction: Secondary | ICD-10-CM | POA: Diagnosis not present

## 2020-07-25 DIAGNOSIS — Z7401 Bed confinement status: Secondary | ICD-10-CM | POA: Diagnosis not present

## 2020-07-25 DIAGNOSIS — R569 Unspecified convulsions: Secondary | ICD-10-CM | POA: Diagnosis not present

## 2020-07-25 DIAGNOSIS — G40901 Epilepsy, unspecified, not intractable, with status epilepticus: Secondary | ICD-10-CM | POA: Diagnosis not present

## 2020-07-25 DIAGNOSIS — M4807 Spinal stenosis, lumbosacral region: Secondary | ICD-10-CM | POA: Diagnosis not present

## 2020-07-25 DIAGNOSIS — M48061 Spinal stenosis, lumbar region without neurogenic claudication: Secondary | ICD-10-CM | POA: Diagnosis not present

## 2020-07-25 DIAGNOSIS — M2550 Pain in unspecified joint: Secondary | ICD-10-CM | POA: Diagnosis not present

## 2020-07-25 DIAGNOSIS — M47812 Spondylosis without myelopathy or radiculopathy, cervical region: Secondary | ICD-10-CM | POA: Diagnosis not present

## 2020-07-25 DIAGNOSIS — R296 Repeated falls: Secondary | ICD-10-CM | POA: Diagnosis not present

## 2020-07-25 DIAGNOSIS — I4891 Unspecified atrial fibrillation: Secondary | ICD-10-CM | POA: Diagnosis not present

## 2020-07-25 NOTE — ED Notes (Signed)
Pt ambulated with walker, assisted to [facility]. Brief changed

## 2020-07-25 NOTE — ED Notes (Signed)
Attempted to call facility for report x 3, no answer

## 2020-07-25 NOTE — Discharge Instructions (Addendum)
You were seen today after a fall.  Your x-rays and CT scans are negative.

## 2020-07-25 NOTE — ED Notes (Signed)
[facility] called for transport.  

## 2020-07-26 DIAGNOSIS — Z20828 Contact with and (suspected) exposure to other viral communicable diseases: Secondary | ICD-10-CM | POA: Diagnosis not present

## 2020-07-30 ENCOUNTER — Emergency Department (HOSPITAL_COMMUNITY): Payer: Medicare Other

## 2020-07-30 ENCOUNTER — Other Ambulatory Visit: Payer: Self-pay

## 2020-07-30 ENCOUNTER — Encounter (HOSPITAL_COMMUNITY): Payer: Self-pay | Admitting: Emergency Medicine

## 2020-07-30 ENCOUNTER — Emergency Department (HOSPITAL_COMMUNITY)
Admission: EM | Admit: 2020-07-30 | Discharge: 2020-07-30 | Disposition: A | Discharge status: Home / Self Care | Payer: Medicare Other | Attending: Emergency Medicine | Admitting: Emergency Medicine

## 2020-07-30 DIAGNOSIS — Z8616 Personal history of COVID-19: Secondary | ICD-10-CM | POA: Diagnosis not present

## 2020-07-30 DIAGNOSIS — S42202A Unspecified fracture of upper end of left humerus, initial encounter for closed fracture: Secondary | ICD-10-CM | POA: Insufficient documentation

## 2020-07-30 DIAGNOSIS — I10 Essential (primary) hypertension: Secondary | ICD-10-CM | POA: Diagnosis not present

## 2020-07-30 DIAGNOSIS — W010XXA Fall on same level from slipping, tripping and stumbling without subsequent striking against object, initial encounter: Secondary | ICD-10-CM | POA: Diagnosis not present

## 2020-07-30 DIAGNOSIS — S42292A Other displaced fracture of upper end of left humerus, initial encounter for closed fracture: Secondary | ICD-10-CM | POA: Diagnosis not present

## 2020-07-30 DIAGNOSIS — R5381 Other malaise: Secondary | ICD-10-CM | POA: Diagnosis not present

## 2020-07-30 DIAGNOSIS — F0390 Unspecified dementia without behavioral disturbance: Secondary | ICD-10-CM | POA: Diagnosis not present

## 2020-07-30 DIAGNOSIS — R609 Edema, unspecified: Secondary | ICD-10-CM | POA: Diagnosis not present

## 2020-07-30 DIAGNOSIS — S4992XA Unspecified injury of left shoulder and upper arm, initial encounter: Secondary | ICD-10-CM | POA: Diagnosis present

## 2020-07-30 DIAGNOSIS — Y9289 Other specified places as the place of occurrence of the external cause: Secondary | ICD-10-CM | POA: Insufficient documentation

## 2020-07-30 DIAGNOSIS — Z043 Encounter for examination and observation following other accident: Secondary | ICD-10-CM | POA: Diagnosis not present

## 2020-07-30 DIAGNOSIS — M25519 Pain in unspecified shoulder: Secondary | ICD-10-CM | POA: Diagnosis not present

## 2020-07-30 DIAGNOSIS — S42332A Displaced oblique fracture of shaft of humerus, left arm, initial encounter for closed fracture: Secondary | ICD-10-CM | POA: Diagnosis not present

## 2020-07-30 DIAGNOSIS — Z7401 Bed confinement status: Secondary | ICD-10-CM | POA: Diagnosis not present

## 2020-07-30 DIAGNOSIS — R0902 Hypoxemia: Secondary | ICD-10-CM | POA: Diagnosis not present

## 2020-07-30 DIAGNOSIS — M2550 Pain in unspecified joint: Secondary | ICD-10-CM | POA: Diagnosis not present

## 2020-07-30 DIAGNOSIS — R52 Pain, unspecified: Secondary | ICD-10-CM | POA: Diagnosis not present

## 2020-07-30 DIAGNOSIS — R102 Pelvic and perineal pain: Secondary | ICD-10-CM | POA: Diagnosis not present

## 2020-07-30 DIAGNOSIS — S0990XA Unspecified injury of head, initial encounter: Secondary | ICD-10-CM | POA: Diagnosis not present

## 2020-07-30 DIAGNOSIS — R079 Chest pain, unspecified: Secondary | ICD-10-CM | POA: Diagnosis not present

## 2020-07-30 MED ORDER — HYDROCODONE-ACETAMINOPHEN 5-325 MG PO TABS: 1.0000 | ORAL_TABLET | Freq: Four times a day (QID) | ORAL | 0 refills | Status: DC | PRN

## 2020-07-30 MED ORDER — MORPHINE SULFATE (PF) 4 MG/ML IV SOLN
4.0000 mg | Freq: Once | INTRAVENOUS | Status: AC
  Administered 2020-07-30: 4 mg via INTRAVENOUS
  Filled 2020-07-30: qty 1

## 2020-07-30 NOTE — ED Provider Notes (Signed)
[facility]-EMERGENCY DEPT Provider Note   CSN: [id number] Arrival date & time: 07/30/20  8016     History Chief Complaint  Patient presents with  . Fall    Kendra Smith is a 86 y.o. female.  86 year old female with prior medical history as detailed below presents for evaluation following fall.  Patient reports that she slipped on the smooth floor this morning.  She landed on her left shoulder.  She complains of pain to the left shoulder.  She does not think that she hit her head.  She denies loss conscious.  She denies neck pain.  She was not ambulatory after the fall secondary to pain in the left shoulder.  Patient was given fentanyl enroute by EMS.  She complains of continued pain to the left shoulder.  Patient with multiple recent falls and recent ED evaluations for same.  The history is provided by the patient, the EMS personnel and medical records.  Fall This is a recurrent problem. The current episode started 1 to 2 hours ago. The problem occurs every several days. The problem has not changed since onset.Nothing aggravates the symptoms. Nothing relieves the symptoms.       Past Medical History:  Diagnosis Date  . Depression   . Glaucoma   . Hyperlipidemia   . Hypertension   . Osteoporosis     Patient Active Problem List   Diagnosis Date Noted  . Seizure (HCC) 05/28/2020  . Weakness generalized   . GI bleed 08/30/2019  . UTI (urinary tract infection) 08/30/2019  . Hypokalemia 08/30/2019  . Demand ischemia (HCC) 08/30/2019  . Palliative care by specialist   . COVID-19   . DNR (do not resuscitate)   . Adult failure to thrive   . Left acetabular fracture (HCC) 08/23/2019  . Pelvic rami fracture (HCC) 08/23/2019  . PAF (paroxysmal atrial fibrillation) (HCC) 08/23/2019  . Rib fracture-12 01/12/2019  . Fracture of transverse process of lumbar vertebra L1-L2 01/12/2019  . Dementia (HCC) 01/12/2019  . Fall 01/12/2019  . Leukocytosis  01/12/2019  . Depression   . Hypertension     Past Surgical History:  Procedure Laterality Date  . ABDOMINAL HYSTERECTOMY    . ANKLE ARTHROSCOPY    . CHOLECYSTECTOMY    . EYE SURGERY    . RECTOCELE REPAIR       OB History   No obstetric history on file.     History reviewed. No pertinent family history.  Social History   Tobacco Use  . Smoking status: Never Smoker  . Smokeless tobacco: Never Used  Vaping Use  . Vaping Use: Never used  Substance Use Topics  . Alcohol use: No  . Drug use: No    Home Medications Prior to Admission medications   Medication Sig Start Date End Date Taking? Authorizing Provider  acetaminophen (TYLENOL) 500 MG tablet Take 1,000 mg by mouth every 6 (six) hours as needed for mild pain, fever or headache.     [provider]  calcitonin, salmon, (MIACALCIN/FORTICAL) 200 UNIT/ACT nasal spray Place 1 spray into alternate nostrils daily.    [provider]  calcium carbonate (OSCAL) 1500 (600 Ca) MG TABS tablet Take 600 mg of elemental calcium by mouth daily with breakfast.    [provider]  cephALEXin (KEFLEX) 250 MG capsule Take 1 capsule (250 mg total) by mouth 3 (three) times daily. Take until 06/02/20 06/01/20   Maurilio Lovely D, DO  cholecalciferol (VITAMIN D3) 25 MCG (1000 UT) tablet  Take 1,000 Units by mouth daily with breakfast.     [provider]  digoxin (LANOXIN) 0.125 MG tablet Take 0.125 mg by mouth every other day.     [provider]  donepezil (ARICEPT) 5 MG tablet Take 5 mg by mouth at bedtime. 10/15/18   [provider]  DULoxetine (CYMBALTA) 60 MG capsule Take 60 mg by mouth daily with breakfast.  05/22/14   [provider]  feeding supplement, ENSURE ENLIVE, (ENSURE ENLIVE) LIQD Take 237 mLs by mouth 2 (two) times daily between meals. Patient not taking: Reported on 05/28/2020 09/01/19   Arrien, York Ram, MD  HYDROcodone-acetaminophen (NORCO/VICODIN) 5-325 MG tablet  Take 1 tablet by mouth See admin instructions. Take 1 tablet by mouth two times a day (9 AM and 9 PM) and every 12 hours as needed for pain 06/01/20   Maurilio Lovely D, DO  Infant Care Products [facility]) Apply 1 application topically See admin instructions. Dermacloud ointment- Apply to buttocks after each incontinent episode    [provider]  latanoprost (XALATAN) 0.005 % ophthalmic solution Place 1 drop into both eyes at bedtime.    [provider]  levETIRAcetam (KEPPRA) 500 MG tablet Take 1 tablet (500 mg total) by mouth 2 (two) times daily. 06/01/20 07/01/20  Sherryll Burger, Pratik D, DO  NON FORMULARY Take 120 mLs by mouth See admin instructions. MedPass- Drink 120 ml's by mouth two times a day    [provider]  pantoprazole (PROTONIX) 40 MG tablet Take 1 tablet (40 mg total) by mouth daily. 09/02/19 05/28/20  Arrien, York Ram, MD  pregabalin (LYRICA) 50 MG capsule Take 50 mg by mouth 2 (two) times daily.    [provider]  timolol (TIMOPTIC) 0.5 % ophthalmic solution Place 1 drop into both eyes daily. 06/15/14   [provider]  traMADol (ULTRAM) 50 MG tablet Take 1 tablet (50 mg total) by mouth every 8 (eight) hours as needed for severe pain. 06/01/20   Sherryll Burger, Pratik D, DO  traZODone (DESYREL) 50 MG tablet Take 25 mg by mouth every evening.  10/15/18   [provider]    Allergies    Ciprofloxacin hcl, Effexor [venlafaxine], Gantrisin [sulfisoxazole], Levaquin [levofloxacin], and Sulfa antibiotics  Review of Systems   Review of Systems  All other systems reviewed and are negative.   Physical Exam Updated Vital Signs SpO2 100%   Physical Exam Vitals and nursing note reviewed.  Constitutional:      General: She is not in acute distress.    Appearance: She is well-developed.  HENT:     Head: Normocephalic and atraumatic.  Eyes:     Conjunctiva/sclera: Conjunctivae normal.     Pupils: Pupils are equal, round, and reactive to  light.  Cardiovascular:     Rate and Rhythm: Normal rate and regular rhythm.     Heart sounds: Normal heart sounds.  Pulmonary:     Effort: Pulmonary effort is normal. No respiratory distress.     Breath sounds: Normal breath sounds.  Abdominal:     General: There is no distension.     Palpations: Abdomen is soft.     Tenderness: There is no abdominal tenderness.  Musculoskeletal:        General: Tenderness present. No deformity. Normal range of motion.     Cervical back: Normal range of motion and neck supple.     Comments: Diffuse tenderness to proximal aspect of left humerus   Distal LUE is NVI  Skin:  General: Skin is warm and dry.  Neurological:     Mental Status: She is alert and oriented to person, place, and time.     ED Results / Procedures / Treatments   Labs (all labs ordered are listed, but only abnormal results are displayed) Labs Reviewed - No data to display  EKG None  Radiology DG Chest 1 View  Result Date: 07/30/2020 CLINICAL DATA:  Pain following fall EXAM: CHEST  1 VIEW COMPARISON:  May 28, 2020 chest radiograph and left shoulder radiographs July 30, 2020 FINDINGS: Fracture proximal left humerus with mild displacement of fracture fragments, more notable than on recent shoulder radiographs. No pneumothorax. Lungs are clear. Heart is slightly enlarged with pulmonary vascularity normal. No adenopathy. No bone lesions IMPRESSION: Fracture proximal humeral metaphysis with impaction and displacement of fracture fragments. No other fracture. No edema or airspace opacity. No pneumothorax. Heart mildly enlarged. Electronically Signed   By: Bretta Bang III M.D.   On: 07/30/2020 11:22   DG Pelvis 1-2 Views  Result Date: 07/30/2020 CLINICAL DATA:  Pain following fall EXAM: PELVIS - 1-2 VIEW COMPARISON:  CT pelvis August 22, 2020 FINDINGS: Frontal image obtained. 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 Age uncertain but  probable chronic fracture left ischium. No acute fracture or dislocation evident. Moderate narrowing of each hip joint. Bones osteoporotic. 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 Probable chronic fracture left ischium. Note that portions of the left acetabulum are difficult to delineate on frontal pelvis radiograph. It may be prudent to correlate with CT pelvis with particular attention to the left hip region to further evaluate given the somewhat complex posttraumatic appearance on the left due to previous trauma and recent fall. Moderate narrowing of each hip joint.  Bones osteoporotic. Electronically Signed   By: Bretta Bang III M.D.   On: 07/30/2020 11:26   CT Head Wo Contrast  Result Date: 07/30/2020 CLINICAL DATA:  Head trauma.  Unwitnessed fall. EXAM: CT HEAD WITHOUT CONTRAST TECHNIQUE: Contiguous axial images were obtained from the base of the skull through the vertex without intravenous contrast. COMPARISON:  CT head 07/24/2020 FINDINGS: Brain: No evidence of acute infarction, hemorrhage, hydrocephalus, extra-axial collection or mass lesion/mass effect. Similar patchy white matter hypoattenuation, nonspecific but likely the sequela of chronic microvascular ischemic disease. Similar remote right corona radiata white matter infarct. Similar remote infarct in the left cerebellar hemisphere. Vascular: Calcific intracranial atherosclerosis. Skull: Normal. Negative for fracture or focal lesion. Sinuses/Orbits: No acute finding. Other: Bilateral TMJ degenerative change.  No mastoid effusion. IMPRESSION: 1. No acute intracranial abnormality. 2. Similar presumed chronic microvascular ischemic disease and remote infarcts in the right corona radiata and left cerebellum. Electronically Signed   By: Feliberto Harts MD   On: 07/30/2020 10:39   CT Cervical Spine Wo Contrast  Result Date: 07/30/2020 CLINICAL DATA:  Unwitnessed fall. EXAM: CT CERVICAL SPINE WITHOUT  CONTRAST TECHNIQUE: Multidetector CT imaging of the cervical spine was performed without intravenous contrast. Multiplanar CT image reconstructions were also generated. COMPARISON:  07/25/2020 FINDINGS: Alignment: No substantial subluxation. Similar alignment in comparison to prior. Skull base and vertebrae: No acute fracture. No primary bone lesion or focal pathologic process. Soft tissues and spinal canal: No prevertebral fluid or swelling. No visible large canal hematoma. Disc levels: Similar multilevel degenerative change, most pronounced at C5-C6 where there is disc space narrowing in endplate spurring. No evidence of substantial bony canal stenosis. Similar multilevel facet uncovertebral hypertrophy which results in  multilevel neural foraminal narrowing Upper chest: Negative. Other: None. IMPRESSION: 1. No evidence of acute fracture or traumatic malalignment. 2. Similar degenerative change, greatest at C5-C6. Electronically Signed   By: Feliberto Harts MD   On: 07/30/2020 10:44   DG Shoulder Left  Result Date: 07/30/2020 CLINICAL DATA:  Pain following fall EXAM: LEFT SHOULDER - 2+ VIEW COMPARISON:  None. FINDINGS: Frontal and Y scapular images were obtained. There is impaction at the proximal humeral metaphysis. An obliquely oriented incomplete fracture extends into the proximal humeral diaphysis. Alignment overall near anatomic. No other fractures. No dislocation. There is moderate generalized joint space narrowing. No erosive change. IMPRESSION: Fracture of the proximal left humeral metaphysis with impaction at the fracture site. Incomplete fracture extends more distally in the proximal humeral diaphysis with alignment anatomic in this area. No other fractures. No dislocation. Moderate generalized osteoarthritis. Electronically Signed   By: Bretta Bang III M.D.   On: 07/30/2020 11:20   DG Humerus Left  Result Date: 07/30/2020 CLINICAL DATA:  Pain following fall EXAM: LEFT HUMERUS - 2+ VIEW  COMPARISON:  Left shoulder radiographs July 30, 2020 FINDINGS: Frontal and lateral views obtained. Fracture of proximal humeral metaphysis is described in the left shoulder report. No fractures evident more distally. No dislocation. Moderate generalized osteoarthritic change in the elbow and shoulder regions. IMPRESSION: Fracture proximal humeral metaphysis, described in more detail in the left shoulder report. No more distal fracture. No dislocation. Moderate shoulder and elbow joint arthropathic changes. Electronically Signed   By: Bretta Bang III M.D.   On: 07/30/2020 11:21    Procedures Procedures (including critical care time)  Medications Ordered in ED Medications  morphine 4 MG/ML injection 4 mg (has no administration in time range)    ED Course  I have reviewed the triage vital signs and the nursing notes.  Pertinent labs & imaging results that were available during my care of the patient were reviewed by me and considered in my medical decision making (see chart for details).    MDM Rules/Calculators/A&P                          MDM  Screen complete  Takila Kronberg was evaluated in Emergency Department on 07/30/2020 for the symptoms described in the history of present illness. She was evaluated in the context of the global COVID-19 pandemic, which necessitated consideration that the patient might be at risk for infection with the SARS-CoV-2 virus that causes COVID-19. Institutional protocols and algorithms that pertain to the evaluation of patients at risk for COVID-19 are in a state of rapid change based on information released by regulatory bodies including the CDC and federal and state organizations. These policies and algorithms were followed during the patient's care in the ED.  Patient is presenting for evaluation of reported fall.  Patient's provided history is suggestive of mechanical slip and fall.  Exam is suggestive of likely left proximal humerus fracture.   This is proven on x-ray.  Patient without other evidence of injury on exam.  Patient without hip pain.  Patient with full active range of movement of all other extremities except for the left arm.  Patient does have left proximal humerus fracture on x-ray.  Patient does appear to be appropriate for outpatient management.  Importance of close follow-up is stressed.  Strict return precautions given and understood.    Final Clinical Impression(s) / ED Diagnoses Final diagnoses:  Closed fracture of proximal end of left  humerus, unspecified fracture morphology, initial encounter    Rx / DC Orders ED Discharge Orders         Ordered    HYDROcodone-acetaminophen (NORCO/VICODIN) 5-325 MG tablet  Every 6 hours PRN        07/30/20 1200           Wynetta FinesMessick, Olis Viverette C, MD 07/30/20 1204

## 2020-07-30 NOTE — Progress Notes (Signed)
Orthopedic Tech Progress Note Patient Details:  Kendra Smith 04-11-1926 [id number]  Ortho Devices Ortho Device/Splint Location: shoulder immobilizer LUE Ortho Device/Splint Interventions: Ordered, Application   Post Interventions Patient Tolerated: Well Instructions Provided: Care of device   [name] 07/30/2020, 11:58 AM

## 2020-07-30 NOTE — ED Notes (Signed)
[facility] called for transport.  

## 2020-07-30 NOTE — ED Triage Notes (Signed)
BIBA Per EMS; Pt coming from [facility] Pt has unwitnessed fall in the bathroom this morning. C/O L shoulder pain  Denies blood thinners Hx of demtia C-collar on 20 R FA  100 mcg fentanyl given by EMS 184/63 84HR 100%  CBG 198

## 2020-07-30 NOTE — Discharge Instructions (Addendum)
Please return for any problem.  Follow up with your regular care provider in 1-2 days.   You have a fracture of your left proximal humerus. Followup with Dr. Charlann Boxer of [facility] in 3-5 days. Close follow up is required to ensure appropriate healing.   Treatment of your injury is primarily sling immobilization and pain control.

## 2020-07-30 NOTE — ED Notes (Signed)
[facility] made aware that pt is returning to facility.

## 2020-07-30 NOTE — ED Notes (Signed)
Pt in CT.

## 2020-07-31 DIAGNOSIS — W19XXXD Unspecified fall, subsequent encounter: Secondary | ICD-10-CM | POA: Diagnosis not present

## 2020-07-31 DIAGNOSIS — F0390 Unspecified dementia without behavioral disturbance: Secondary | ICD-10-CM | POA: Diagnosis not present

## 2020-07-31 DIAGNOSIS — F329 Major depressive disorder, single episode, unspecified: Secondary | ICD-10-CM | POA: Diagnosis not present

## 2020-07-31 DIAGNOSIS — I10 Essential (primary) hypertension: Secondary | ICD-10-CM | POA: Diagnosis not present

## 2020-07-31 DIAGNOSIS — G40901 Epilepsy, unspecified, not intractable, with status epilepticus: Secondary | ICD-10-CM | POA: Diagnosis not present

## 2020-07-31 DIAGNOSIS — I480 Paroxysmal atrial fibrillation: Secondary | ICD-10-CM | POA: Diagnosis not present

## 2020-07-31 DIAGNOSIS — D508 Other iron deficiency anemias: Secondary | ICD-10-CM | POA: Diagnosis not present

## 2020-07-31 DIAGNOSIS — H409 Unspecified glaucoma: Secondary | ICD-10-CM | POA: Diagnosis not present

## 2020-08-01 DIAGNOSIS — I4891 Unspecified atrial fibrillation: Secondary | ICD-10-CM | POA: Diagnosis not present

## 2020-08-01 DIAGNOSIS — H409 Unspecified glaucoma: Secondary | ICD-10-CM | POA: Diagnosis not present

## 2020-08-01 DIAGNOSIS — F0390 Unspecified dementia without behavioral disturbance: Secondary | ICD-10-CM | POA: Diagnosis not present

## 2020-08-01 DIAGNOSIS — F331 Major depressive disorder, recurrent, moderate: Secondary | ICD-10-CM | POA: Diagnosis not present

## 2020-08-01 DIAGNOSIS — S32501D Unspecified fracture of right pubis, subsequent encounter for fracture with routine healing: Secondary | ICD-10-CM | POA: Diagnosis not present

## 2020-08-01 DIAGNOSIS — I10 Essential (primary) hypertension: Secondary | ICD-10-CM | POA: Diagnosis not present

## 2020-08-01 DIAGNOSIS — E785 Hyperlipidemia, unspecified: Secondary | ICD-10-CM | POA: Diagnosis not present

## 2020-08-01 DIAGNOSIS — W19XXXD Unspecified fall, subsequent encounter: Secondary | ICD-10-CM | POA: Diagnosis not present

## 2020-08-02 DIAGNOSIS — H409 Unspecified glaucoma: Secondary | ICD-10-CM | POA: Diagnosis not present

## 2020-08-02 DIAGNOSIS — F0390 Unspecified dementia without behavioral disturbance: Secondary | ICD-10-CM | POA: Diagnosis not present

## 2020-08-02 DIAGNOSIS — W19XXXD Unspecified fall, subsequent encounter: Secondary | ICD-10-CM | POA: Diagnosis not present

## 2020-08-02 DIAGNOSIS — I4891 Unspecified atrial fibrillation: Secondary | ICD-10-CM | POA: Diagnosis not present

## 2020-08-02 DIAGNOSIS — I10 Essential (primary) hypertension: Secondary | ICD-10-CM | POA: Diagnosis not present

## 2020-08-02 DIAGNOSIS — S32501D Unspecified fracture of right pubis, subsequent encounter for fracture with routine healing: Secondary | ICD-10-CM | POA: Diagnosis not present

## 2020-08-03 DIAGNOSIS — I10 Essential (primary) hypertension: Secondary | ICD-10-CM | POA: Diagnosis not present

## 2020-08-03 DIAGNOSIS — I4891 Unspecified atrial fibrillation: Secondary | ICD-10-CM | POA: Diagnosis not present

## 2020-08-03 DIAGNOSIS — W19XXXD Unspecified fall, subsequent encounter: Secondary | ICD-10-CM | POA: Diagnosis not present

## 2020-08-03 DIAGNOSIS — S32501D Unspecified fracture of right pubis, subsequent encounter for fracture with routine healing: Secondary | ICD-10-CM | POA: Diagnosis not present

## 2020-08-03 DIAGNOSIS — H409 Unspecified glaucoma: Secondary | ICD-10-CM | POA: Diagnosis not present

## 2020-08-03 DIAGNOSIS — F0390 Unspecified dementia without behavioral disturbance: Secondary | ICD-10-CM | POA: Diagnosis not present

## 2020-08-06 DIAGNOSIS — W19XXXD Unspecified fall, subsequent encounter: Secondary | ICD-10-CM | POA: Diagnosis not present

## 2020-08-06 DIAGNOSIS — I10 Essential (primary) hypertension: Secondary | ICD-10-CM | POA: Diagnosis not present

## 2020-08-06 DIAGNOSIS — S32501D Unspecified fracture of right pubis, subsequent encounter for fracture with routine healing: Secondary | ICD-10-CM | POA: Diagnosis not present

## 2020-08-06 DIAGNOSIS — H409 Unspecified glaucoma: Secondary | ICD-10-CM | POA: Diagnosis not present

## 2020-08-06 DIAGNOSIS — I4891 Unspecified atrial fibrillation: Secondary | ICD-10-CM | POA: Diagnosis not present

## 2020-08-06 DIAGNOSIS — F0390 Unspecified dementia without behavioral disturbance: Secondary | ICD-10-CM | POA: Diagnosis not present

## 2020-08-07 DIAGNOSIS — I4891 Unspecified atrial fibrillation: Secondary | ICD-10-CM | POA: Diagnosis not present

## 2020-08-07 DIAGNOSIS — S32501D Unspecified fracture of right pubis, subsequent encounter for fracture with routine healing: Secondary | ICD-10-CM | POA: Diagnosis not present

## 2020-08-07 DIAGNOSIS — I10 Essential (primary) hypertension: Secondary | ICD-10-CM | POA: Diagnosis not present

## 2020-08-07 DIAGNOSIS — H409 Unspecified glaucoma: Secondary | ICD-10-CM | POA: Diagnosis not present

## 2020-08-07 DIAGNOSIS — W19XXXD Unspecified fall, subsequent encounter: Secondary | ICD-10-CM | POA: Diagnosis not present

## 2020-08-07 DIAGNOSIS — F0390 Unspecified dementia without behavioral disturbance: Secondary | ICD-10-CM | POA: Diagnosis not present

## 2020-08-08 DIAGNOSIS — I10 Essential (primary) hypertension: Secondary | ICD-10-CM | POA: Diagnosis not present

## 2020-08-08 DIAGNOSIS — S32501D Unspecified fracture of right pubis, subsequent encounter for fracture with routine healing: Secondary | ICD-10-CM | POA: Diagnosis not present

## 2020-08-08 DIAGNOSIS — H409 Unspecified glaucoma: Secondary | ICD-10-CM | POA: Diagnosis not present

## 2020-08-08 DIAGNOSIS — I4891 Unspecified atrial fibrillation: Secondary | ICD-10-CM | POA: Diagnosis not present

## 2020-08-08 DIAGNOSIS — F0390 Unspecified dementia without behavioral disturbance: Secondary | ICD-10-CM | POA: Diagnosis not present

## 2020-08-08 DIAGNOSIS — W19XXXD Unspecified fall, subsequent encounter: Secondary | ICD-10-CM | POA: Diagnosis not present

## 2020-08-09 DIAGNOSIS — M25512 Pain in left shoulder: Secondary | ICD-10-CM | POA: Diagnosis not present

## 2020-08-09 DIAGNOSIS — S42202A Unspecified fracture of upper end of left humerus, initial encounter for closed fracture: Secondary | ICD-10-CM | POA: Diagnosis not present

## 2020-08-09 DIAGNOSIS — F0390 Unspecified dementia without behavioral disturbance: Secondary | ICD-10-CM | POA: Diagnosis not present

## 2020-08-09 DIAGNOSIS — H409 Unspecified glaucoma: Secondary | ICD-10-CM | POA: Diagnosis not present

## 2020-08-09 DIAGNOSIS — I4891 Unspecified atrial fibrillation: Secondary | ICD-10-CM | POA: Diagnosis not present

## 2020-08-09 DIAGNOSIS — W19XXXD Unspecified fall, subsequent encounter: Secondary | ICD-10-CM | POA: Diagnosis not present

## 2020-08-09 DIAGNOSIS — S32501D Unspecified fracture of right pubis, subsequent encounter for fracture with routine healing: Secondary | ICD-10-CM | POA: Diagnosis not present

## 2020-08-09 DIAGNOSIS — I10 Essential (primary) hypertension: Secondary | ICD-10-CM | POA: Diagnosis not present

## 2020-08-10 DIAGNOSIS — F331 Major depressive disorder, recurrent, moderate: Secondary | ICD-10-CM | POA: Diagnosis not present

## 2020-08-10 DIAGNOSIS — E785 Hyperlipidemia, unspecified: Secondary | ICD-10-CM | POA: Diagnosis not present

## 2020-08-10 DIAGNOSIS — I4891 Unspecified atrial fibrillation: Secondary | ICD-10-CM | POA: Diagnosis not present

## 2020-08-10 DIAGNOSIS — W19XXXD Unspecified fall, subsequent encounter: Secondary | ICD-10-CM | POA: Diagnosis not present

## 2020-08-10 DIAGNOSIS — S32501D Unspecified fracture of right pubis, subsequent encounter for fracture with routine healing: Secondary | ICD-10-CM | POA: Diagnosis not present

## 2020-08-10 DIAGNOSIS — H409 Unspecified glaucoma: Secondary | ICD-10-CM | POA: Diagnosis not present

## 2020-08-10 DIAGNOSIS — F0390 Unspecified dementia without behavioral disturbance: Secondary | ICD-10-CM | POA: Diagnosis not present

## 2020-08-10 DIAGNOSIS — I10 Essential (primary) hypertension: Secondary | ICD-10-CM | POA: Diagnosis not present

## 2020-08-13 DIAGNOSIS — W19XXXD Unspecified fall, subsequent encounter: Secondary | ICD-10-CM | POA: Diagnosis not present

## 2020-08-13 DIAGNOSIS — S32501D Unspecified fracture of right pubis, subsequent encounter for fracture with routine healing: Secondary | ICD-10-CM | POA: Diagnosis not present

## 2020-08-13 DIAGNOSIS — I10 Essential (primary) hypertension: Secondary | ICD-10-CM | POA: Diagnosis not present

## 2020-08-13 DIAGNOSIS — I4891 Unspecified atrial fibrillation: Secondary | ICD-10-CM | POA: Diagnosis not present

## 2020-08-13 DIAGNOSIS — F0390 Unspecified dementia without behavioral disturbance: Secondary | ICD-10-CM | POA: Diagnosis not present

## 2020-08-13 DIAGNOSIS — H409 Unspecified glaucoma: Secondary | ICD-10-CM | POA: Diagnosis not present

## 2020-08-14 DIAGNOSIS — I4891 Unspecified atrial fibrillation: Secondary | ICD-10-CM | POA: Diagnosis not present

## 2020-08-14 DIAGNOSIS — F0390 Unspecified dementia without behavioral disturbance: Secondary | ICD-10-CM | POA: Diagnosis not present

## 2020-08-14 DIAGNOSIS — W19XXXD Unspecified fall, subsequent encounter: Secondary | ICD-10-CM | POA: Diagnosis not present

## 2020-08-14 DIAGNOSIS — S32501D Unspecified fracture of right pubis, subsequent encounter for fracture with routine healing: Secondary | ICD-10-CM | POA: Diagnosis not present

## 2020-08-14 DIAGNOSIS — H409 Unspecified glaucoma: Secondary | ICD-10-CM | POA: Diagnosis not present

## 2020-08-14 DIAGNOSIS — I10 Essential (primary) hypertension: Secondary | ICD-10-CM | POA: Diagnosis not present

## 2020-08-15 DIAGNOSIS — F0390 Unspecified dementia without behavioral disturbance: Secondary | ICD-10-CM | POA: Diagnosis not present

## 2020-08-15 DIAGNOSIS — I10 Essential (primary) hypertension: Secondary | ICD-10-CM | POA: Diagnosis not present

## 2020-08-15 DIAGNOSIS — H409 Unspecified glaucoma: Secondary | ICD-10-CM | POA: Diagnosis not present

## 2020-08-15 DIAGNOSIS — W19XXXD Unspecified fall, subsequent encounter: Secondary | ICD-10-CM | POA: Diagnosis not present

## 2020-08-15 DIAGNOSIS — I4891 Unspecified atrial fibrillation: Secondary | ICD-10-CM | POA: Diagnosis not present

## 2020-08-15 DIAGNOSIS — S32501D Unspecified fracture of right pubis, subsequent encounter for fracture with routine healing: Secondary | ICD-10-CM | POA: Diagnosis not present

## 2020-08-16 DIAGNOSIS — H409 Unspecified glaucoma: Secondary | ICD-10-CM | POA: Diagnosis not present

## 2020-08-16 DIAGNOSIS — I4891 Unspecified atrial fibrillation: Secondary | ICD-10-CM | POA: Diagnosis not present

## 2020-08-16 DIAGNOSIS — W19XXXD Unspecified fall, subsequent encounter: Secondary | ICD-10-CM | POA: Diagnosis not present

## 2020-08-16 DIAGNOSIS — F0390 Unspecified dementia without behavioral disturbance: Secondary | ICD-10-CM | POA: Diagnosis not present

## 2020-08-16 DIAGNOSIS — I10 Essential (primary) hypertension: Secondary | ICD-10-CM | POA: Diagnosis not present

## 2020-08-16 DIAGNOSIS — S32501D Unspecified fracture of right pubis, subsequent encounter for fracture with routine healing: Secondary | ICD-10-CM | POA: Diagnosis not present

## 2020-08-17 DIAGNOSIS — H409 Unspecified glaucoma: Secondary | ICD-10-CM | POA: Diagnosis not present

## 2020-08-17 DIAGNOSIS — F0390 Unspecified dementia without behavioral disturbance: Secondary | ICD-10-CM | POA: Diagnosis not present

## 2020-08-17 DIAGNOSIS — W19XXXD Unspecified fall, subsequent encounter: Secondary | ICD-10-CM | POA: Diagnosis not present

## 2020-08-17 DIAGNOSIS — I10 Essential (primary) hypertension: Secondary | ICD-10-CM | POA: Diagnosis not present

## 2020-08-17 DIAGNOSIS — I4891 Unspecified atrial fibrillation: Secondary | ICD-10-CM | POA: Diagnosis not present

## 2020-08-17 DIAGNOSIS — S32501D Unspecified fracture of right pubis, subsequent encounter for fracture with routine healing: Secondary | ICD-10-CM | POA: Diagnosis not present

## 2020-08-20 DIAGNOSIS — W19XXXD Unspecified fall, subsequent encounter: Secondary | ICD-10-CM | POA: Diagnosis not present

## 2020-08-20 DIAGNOSIS — I4891 Unspecified atrial fibrillation: Secondary | ICD-10-CM | POA: Diagnosis not present

## 2020-08-20 DIAGNOSIS — I10 Essential (primary) hypertension: Secondary | ICD-10-CM | POA: Diagnosis not present

## 2020-08-20 DIAGNOSIS — S32501D Unspecified fracture of right pubis, subsequent encounter for fracture with routine healing: Secondary | ICD-10-CM | POA: Diagnosis not present

## 2020-08-20 DIAGNOSIS — H409 Unspecified glaucoma: Secondary | ICD-10-CM | POA: Diagnosis not present

## 2020-08-20 DIAGNOSIS — F0390 Unspecified dementia without behavioral disturbance: Secondary | ICD-10-CM | POA: Diagnosis not present

## 2020-08-21 DIAGNOSIS — H409 Unspecified glaucoma: Secondary | ICD-10-CM | POA: Diagnosis not present

## 2020-08-21 DIAGNOSIS — F0390 Unspecified dementia without behavioral disturbance: Secondary | ICD-10-CM | POA: Diagnosis not present

## 2020-08-21 DIAGNOSIS — S32501D Unspecified fracture of right pubis, subsequent encounter for fracture with routine healing: Secondary | ICD-10-CM | POA: Diagnosis not present

## 2020-08-21 DIAGNOSIS — I4891 Unspecified atrial fibrillation: Secondary | ICD-10-CM | POA: Diagnosis not present

## 2020-08-21 DIAGNOSIS — W19XXXD Unspecified fall, subsequent encounter: Secondary | ICD-10-CM | POA: Diagnosis not present

## 2020-08-21 DIAGNOSIS — I10 Essential (primary) hypertension: Secondary | ICD-10-CM | POA: Diagnosis not present

## 2020-08-22 DIAGNOSIS — D62 Acute posthemorrhagic anemia: Secondary | ICD-10-CM | POA: Diagnosis not present

## 2020-08-22 DIAGNOSIS — W19XXXD Unspecified fall, subsequent encounter: Secondary | ICD-10-CM | POA: Diagnosis not present

## 2020-08-22 DIAGNOSIS — F0390 Unspecified dementia without behavioral disturbance: Secondary | ICD-10-CM | POA: Diagnosis not present

## 2020-08-22 DIAGNOSIS — H409 Unspecified glaucoma: Secondary | ICD-10-CM | POA: Diagnosis not present

## 2020-08-22 DIAGNOSIS — R569 Unspecified convulsions: Secondary | ICD-10-CM | POA: Diagnosis not present

## 2020-08-22 DIAGNOSIS — F0391 Unspecified dementia with behavioral disturbance: Secondary | ICD-10-CM | POA: Diagnosis not present

## 2020-08-22 DIAGNOSIS — D509 Iron deficiency anemia, unspecified: Secondary | ICD-10-CM | POA: Diagnosis not present

## 2020-08-22 DIAGNOSIS — M810 Age-related osteoporosis without current pathological fracture: Secondary | ICD-10-CM | POA: Diagnosis not present

## 2020-08-22 DIAGNOSIS — S32501D Unspecified fracture of right pubis, subsequent encounter for fracture with routine healing: Secondary | ICD-10-CM | POA: Diagnosis not present

## 2020-08-22 DIAGNOSIS — I10 Essential (primary) hypertension: Secondary | ICD-10-CM | POA: Diagnosis not present

## 2020-08-22 DIAGNOSIS — Z66 Do not resuscitate: Secondary | ICD-10-CM | POA: Diagnosis not present

## 2020-08-22 DIAGNOSIS — E785 Hyperlipidemia, unspecified: Secondary | ICD-10-CM | POA: Diagnosis not present

## 2020-08-22 DIAGNOSIS — S329XXA Fracture of unspecified parts of lumbosacral spine and pelvis, initial encounter for closed fracture: Secondary | ICD-10-CM | POA: Diagnosis not present

## 2020-08-22 DIAGNOSIS — I4891 Unspecified atrial fibrillation: Secondary | ICD-10-CM | POA: Diagnosis not present

## 2020-08-22 DIAGNOSIS — N390 Urinary tract infection, site not specified: Secondary | ICD-10-CM | POA: Diagnosis not present

## 2020-08-23 DIAGNOSIS — D649 Anemia, unspecified: Secondary | ICD-10-CM | POA: Diagnosis not present

## 2020-08-23 DIAGNOSIS — W19XXXD Unspecified fall, subsequent encounter: Secondary | ICD-10-CM | POA: Diagnosis not present

## 2020-08-23 DIAGNOSIS — I10 Essential (primary) hypertension: Secondary | ICD-10-CM | POA: Diagnosis not present

## 2020-08-23 DIAGNOSIS — E876 Hypokalemia: Secondary | ICD-10-CM | POA: Diagnosis not present

## 2020-08-23 DIAGNOSIS — S32501D Unspecified fracture of right pubis, subsequent encounter for fracture with routine healing: Secondary | ICD-10-CM | POA: Diagnosis not present

## 2020-08-23 DIAGNOSIS — I4891 Unspecified atrial fibrillation: Secondary | ICD-10-CM | POA: Diagnosis not present

## 2020-08-23 DIAGNOSIS — F0390 Unspecified dementia without behavioral disturbance: Secondary | ICD-10-CM | POA: Diagnosis not present

## 2020-08-23 DIAGNOSIS — H409 Unspecified glaucoma: Secondary | ICD-10-CM | POA: Diagnosis not present

## 2020-08-23 DIAGNOSIS — E559 Vitamin D deficiency, unspecified: Secondary | ICD-10-CM | POA: Diagnosis not present

## 2020-08-24 DIAGNOSIS — S32501D Unspecified fracture of right pubis, subsequent encounter for fracture with routine healing: Secondary | ICD-10-CM | POA: Diagnosis not present

## 2020-08-24 DIAGNOSIS — W19XXXD Unspecified fall, subsequent encounter: Secondary | ICD-10-CM | POA: Diagnosis not present

## 2020-08-24 DIAGNOSIS — H409 Unspecified glaucoma: Secondary | ICD-10-CM | POA: Diagnosis not present

## 2020-08-24 DIAGNOSIS — F0390 Unspecified dementia without behavioral disturbance: Secondary | ICD-10-CM | POA: Diagnosis not present

## 2020-08-24 DIAGNOSIS — I4891 Unspecified atrial fibrillation: Secondary | ICD-10-CM | POA: Diagnosis not present

## 2020-08-24 DIAGNOSIS — I10 Essential (primary) hypertension: Secondary | ICD-10-CM | POA: Diagnosis not present

## 2020-08-27 DIAGNOSIS — W19XXXD Unspecified fall, subsequent encounter: Secondary | ICD-10-CM | POA: Diagnosis not present

## 2020-08-27 DIAGNOSIS — F0390 Unspecified dementia without behavioral disturbance: Secondary | ICD-10-CM | POA: Diagnosis not present

## 2020-08-27 DIAGNOSIS — H409 Unspecified glaucoma: Secondary | ICD-10-CM | POA: Diagnosis not present

## 2020-08-27 DIAGNOSIS — I10 Essential (primary) hypertension: Secondary | ICD-10-CM | POA: Diagnosis not present

## 2020-08-27 DIAGNOSIS — Z20828 Contact with and (suspected) exposure to other viral communicable diseases: Secondary | ICD-10-CM | POA: Diagnosis not present

## 2020-08-27 DIAGNOSIS — I4891 Unspecified atrial fibrillation: Secondary | ICD-10-CM | POA: Diagnosis not present

## 2020-08-27 DIAGNOSIS — S32501D Unspecified fracture of right pubis, subsequent encounter for fracture with routine healing: Secondary | ICD-10-CM | POA: Diagnosis not present

## 2020-08-28 DIAGNOSIS — I10 Essential (primary) hypertension: Secondary | ICD-10-CM | POA: Diagnosis not present

## 2020-08-28 DIAGNOSIS — F0390 Unspecified dementia without behavioral disturbance: Secondary | ICD-10-CM | POA: Diagnosis not present

## 2020-08-28 DIAGNOSIS — H409 Unspecified glaucoma: Secondary | ICD-10-CM | POA: Diagnosis not present

## 2020-08-28 DIAGNOSIS — I4891 Unspecified atrial fibrillation: Secondary | ICD-10-CM | POA: Diagnosis not present

## 2020-08-28 DIAGNOSIS — W19XXXD Unspecified fall, subsequent encounter: Secondary | ICD-10-CM | POA: Diagnosis not present

## 2020-08-28 DIAGNOSIS — S32501D Unspecified fracture of right pubis, subsequent encounter for fracture with routine healing: Secondary | ICD-10-CM | POA: Diagnosis not present

## 2020-08-29 DIAGNOSIS — I10 Essential (primary) hypertension: Secondary | ICD-10-CM | POA: Diagnosis not present

## 2020-08-29 DIAGNOSIS — I4891 Unspecified atrial fibrillation: Secondary | ICD-10-CM | POA: Diagnosis not present

## 2020-08-29 DIAGNOSIS — S32501D Unspecified fracture of right pubis, subsequent encounter for fracture with routine healing: Secondary | ICD-10-CM | POA: Diagnosis not present

## 2020-08-29 DIAGNOSIS — F0390 Unspecified dementia without behavioral disturbance: Secondary | ICD-10-CM | POA: Diagnosis not present

## 2020-08-29 DIAGNOSIS — H409 Unspecified glaucoma: Secondary | ICD-10-CM | POA: Diagnosis not present

## 2020-08-29 DIAGNOSIS — W19XXXD Unspecified fall, subsequent encounter: Secondary | ICD-10-CM | POA: Diagnosis not present

## 2020-08-30 DIAGNOSIS — I4891 Unspecified atrial fibrillation: Secondary | ICD-10-CM | POA: Diagnosis not present

## 2020-08-30 DIAGNOSIS — I10 Essential (primary) hypertension: Secondary | ICD-10-CM | POA: Diagnosis not present

## 2020-08-30 DIAGNOSIS — Z20828 Contact with and (suspected) exposure to other viral communicable diseases: Secondary | ICD-10-CM | POA: Diagnosis not present

## 2020-08-30 DIAGNOSIS — H409 Unspecified glaucoma: Secondary | ICD-10-CM | POA: Diagnosis not present

## 2020-08-30 DIAGNOSIS — F0390 Unspecified dementia without behavioral disturbance: Secondary | ICD-10-CM | POA: Diagnosis not present

## 2020-08-30 DIAGNOSIS — W19XXXD Unspecified fall, subsequent encounter: Secondary | ICD-10-CM | POA: Diagnosis not present

## 2020-08-30 DIAGNOSIS — S32501D Unspecified fracture of right pubis, subsequent encounter for fracture with routine healing: Secondary | ICD-10-CM | POA: Diagnosis not present

## 2020-08-31 DIAGNOSIS — F0390 Unspecified dementia without behavioral disturbance: Secondary | ICD-10-CM | POA: Diagnosis not present

## 2020-08-31 DIAGNOSIS — S32501D Unspecified fracture of right pubis, subsequent encounter for fracture with routine healing: Secondary | ICD-10-CM | POA: Diagnosis not present

## 2020-08-31 DIAGNOSIS — H409 Unspecified glaucoma: Secondary | ICD-10-CM | POA: Diagnosis not present

## 2020-08-31 DIAGNOSIS — I4891 Unspecified atrial fibrillation: Secondary | ICD-10-CM | POA: Diagnosis not present

## 2020-08-31 DIAGNOSIS — I10 Essential (primary) hypertension: Secondary | ICD-10-CM | POA: Diagnosis not present

## 2020-08-31 DIAGNOSIS — W19XXXD Unspecified fall, subsequent encounter: Secondary | ICD-10-CM | POA: Diagnosis not present

## 2020-09-03 DIAGNOSIS — F0390 Unspecified dementia without behavioral disturbance: Secondary | ICD-10-CM | POA: Diagnosis not present

## 2020-09-03 DIAGNOSIS — S32501D Unspecified fracture of right pubis, subsequent encounter for fracture with routine healing: Secondary | ICD-10-CM | POA: Diagnosis not present

## 2020-09-03 DIAGNOSIS — Z20828 Contact with and (suspected) exposure to other viral communicable diseases: Secondary | ICD-10-CM | POA: Diagnosis not present

## 2020-09-03 DIAGNOSIS — H409 Unspecified glaucoma: Secondary | ICD-10-CM | POA: Diagnosis not present

## 2020-09-03 DIAGNOSIS — I4891 Unspecified atrial fibrillation: Secondary | ICD-10-CM | POA: Diagnosis not present

## 2020-09-03 DIAGNOSIS — W19XXXD Unspecified fall, subsequent encounter: Secondary | ICD-10-CM | POA: Diagnosis not present

## 2020-09-03 DIAGNOSIS — I10 Essential (primary) hypertension: Secondary | ICD-10-CM | POA: Diagnosis not present

## 2020-09-04 DIAGNOSIS — S32501D Unspecified fracture of right pubis, subsequent encounter for fracture with routine healing: Secondary | ICD-10-CM | POA: Diagnosis not present

## 2020-09-04 DIAGNOSIS — I10 Essential (primary) hypertension: Secondary | ICD-10-CM | POA: Diagnosis not present

## 2020-09-04 DIAGNOSIS — W19XXXD Unspecified fall, subsequent encounter: Secondary | ICD-10-CM | POA: Diagnosis not present

## 2020-09-04 DIAGNOSIS — I4891 Unspecified atrial fibrillation: Secondary | ICD-10-CM | POA: Diagnosis not present

## 2020-09-04 DIAGNOSIS — F0390 Unspecified dementia without behavioral disturbance: Secondary | ICD-10-CM | POA: Diagnosis not present

## 2020-09-04 DIAGNOSIS — H409 Unspecified glaucoma: Secondary | ICD-10-CM | POA: Diagnosis not present

## 2020-09-06 DIAGNOSIS — H409 Unspecified glaucoma: Secondary | ICD-10-CM | POA: Diagnosis not present

## 2020-09-06 DIAGNOSIS — I10 Essential (primary) hypertension: Secondary | ICD-10-CM | POA: Diagnosis not present

## 2020-09-06 DIAGNOSIS — F0390 Unspecified dementia without behavioral disturbance: Secondary | ICD-10-CM | POA: Diagnosis not present

## 2020-09-06 DIAGNOSIS — S32501D Unspecified fracture of right pubis, subsequent encounter for fracture with routine healing: Secondary | ICD-10-CM | POA: Diagnosis not present

## 2020-09-06 DIAGNOSIS — Z20828 Contact with and (suspected) exposure to other viral communicable diseases: Secondary | ICD-10-CM | POA: Diagnosis not present

## 2020-09-06 DIAGNOSIS — I4891 Unspecified atrial fibrillation: Secondary | ICD-10-CM | POA: Diagnosis not present

## 2020-09-06 DIAGNOSIS — W19XXXD Unspecified fall, subsequent encounter: Secondary | ICD-10-CM | POA: Diagnosis not present

## 2020-09-07 DIAGNOSIS — S42202A Unspecified fracture of upper end of left humerus, initial encounter for closed fracture: Secondary | ICD-10-CM | POA: Diagnosis not present

## 2020-09-09 DIAGNOSIS — Z20828 Contact with and (suspected) exposure to other viral communicable diseases: Secondary | ICD-10-CM | POA: Diagnosis not present

## 2020-09-11 DIAGNOSIS — Z9181 History of falling: Secondary | ICD-10-CM | POA: Diagnosis not present

## 2020-09-11 DIAGNOSIS — R2689 Other abnormalities of gait and mobility: Secondary | ICD-10-CM | POA: Diagnosis not present

## 2020-09-11 DIAGNOSIS — F331 Major depressive disorder, recurrent, moderate: Secondary | ICD-10-CM | POA: Diagnosis not present

## 2020-09-11 DIAGNOSIS — S42202D Unspecified fracture of upper end of left humerus, subsequent encounter for fracture with routine healing: Secondary | ICD-10-CM | POA: Diagnosis not present

## 2020-09-11 DIAGNOSIS — M6281 Muscle weakness (generalized): Secondary | ICD-10-CM | POA: Diagnosis not present

## 2020-09-11 DIAGNOSIS — I10 Essential (primary) hypertension: Secondary | ICD-10-CM | POA: Diagnosis not present

## 2020-09-11 DIAGNOSIS — E785 Hyperlipidemia, unspecified: Secondary | ICD-10-CM | POA: Diagnosis not present

## 2020-09-11 DIAGNOSIS — R569 Unspecified convulsions: Secondary | ICD-10-CM | POA: Diagnosis not present

## 2020-09-11 DIAGNOSIS — H409 Unspecified glaucoma: Secondary | ICD-10-CM | POA: Diagnosis not present

## 2020-09-11 DIAGNOSIS — R278 Other lack of coordination: Secondary | ICD-10-CM | POA: Diagnosis not present

## 2020-09-11 DIAGNOSIS — I4891 Unspecified atrial fibrillation: Secondary | ICD-10-CM | POA: Diagnosis not present

## 2020-09-11 DIAGNOSIS — F0390 Unspecified dementia without behavioral disturbance: Secondary | ICD-10-CM | POA: Diagnosis not present

## 2020-09-12 DIAGNOSIS — H409 Unspecified glaucoma: Secondary | ICD-10-CM | POA: Diagnosis not present

## 2020-09-12 DIAGNOSIS — I10 Essential (primary) hypertension: Secondary | ICD-10-CM | POA: Diagnosis not present

## 2020-09-12 DIAGNOSIS — I4891 Unspecified atrial fibrillation: Secondary | ICD-10-CM | POA: Diagnosis not present

## 2020-09-12 DIAGNOSIS — F0390 Unspecified dementia without behavioral disturbance: Secondary | ICD-10-CM | POA: Diagnosis not present

## 2020-09-12 DIAGNOSIS — R569 Unspecified convulsions: Secondary | ICD-10-CM | POA: Diagnosis not present

## 2020-09-12 DIAGNOSIS — S42202D Unspecified fracture of upper end of left humerus, subsequent encounter for fracture with routine healing: Secondary | ICD-10-CM | POA: Diagnosis not present

## 2020-09-13 DIAGNOSIS — S42202D Unspecified fracture of upper end of left humerus, subsequent encounter for fracture with routine healing: Secondary | ICD-10-CM | POA: Diagnosis not present

## 2020-09-13 DIAGNOSIS — I10 Essential (primary) hypertension: Secondary | ICD-10-CM | POA: Diagnosis not present

## 2020-09-13 DIAGNOSIS — I4891 Unspecified atrial fibrillation: Secondary | ICD-10-CM | POA: Diagnosis not present

## 2020-09-13 DIAGNOSIS — H409 Unspecified glaucoma: Secondary | ICD-10-CM | POA: Diagnosis not present

## 2020-09-13 DIAGNOSIS — R569 Unspecified convulsions: Secondary | ICD-10-CM | POA: Diagnosis not present

## 2020-09-13 DIAGNOSIS — F0390 Unspecified dementia without behavioral disturbance: Secondary | ICD-10-CM | POA: Diagnosis not present

## 2020-09-14 DIAGNOSIS — R569 Unspecified convulsions: Secondary | ICD-10-CM | POA: Diagnosis not present

## 2020-09-14 DIAGNOSIS — I10 Essential (primary) hypertension: Secondary | ICD-10-CM | POA: Diagnosis not present

## 2020-09-14 DIAGNOSIS — F0390 Unspecified dementia without behavioral disturbance: Secondary | ICD-10-CM | POA: Diagnosis not present

## 2020-09-14 DIAGNOSIS — H409 Unspecified glaucoma: Secondary | ICD-10-CM | POA: Diagnosis not present

## 2020-09-14 DIAGNOSIS — I4891 Unspecified atrial fibrillation: Secondary | ICD-10-CM | POA: Diagnosis not present

## 2020-09-14 DIAGNOSIS — S42202D Unspecified fracture of upper end of left humerus, subsequent encounter for fracture with routine healing: Secondary | ICD-10-CM | POA: Diagnosis not present

## 2020-09-16 DIAGNOSIS — G4089 Other seizures: Secondary | ICD-10-CM | POA: Diagnosis not present

## 2020-09-16 DIAGNOSIS — F0390 Unspecified dementia without behavioral disturbance: Secondary | ICD-10-CM | POA: Diagnosis not present

## 2020-09-16 DIAGNOSIS — G40901 Epilepsy, unspecified, not intractable, with status epilepticus: Secondary | ICD-10-CM | POA: Diagnosis not present

## 2020-09-16 DIAGNOSIS — K219 Gastro-esophageal reflux disease without esophagitis: Secondary | ICD-10-CM | POA: Diagnosis not present

## 2020-09-16 DIAGNOSIS — I480 Paroxysmal atrial fibrillation: Secondary | ICD-10-CM | POA: Diagnosis not present

## 2020-09-16 DIAGNOSIS — F329 Major depressive disorder, single episode, unspecified: Secondary | ICD-10-CM | POA: Diagnosis not present

## 2020-09-16 DIAGNOSIS — H409 Unspecified glaucoma: Secondary | ICD-10-CM | POA: Diagnosis not present

## 2020-09-17 DIAGNOSIS — S42202D Unspecified fracture of upper end of left humerus, subsequent encounter for fracture with routine healing: Secondary | ICD-10-CM | POA: Diagnosis not present

## 2020-09-17 DIAGNOSIS — I10 Essential (primary) hypertension: Secondary | ICD-10-CM | POA: Diagnosis not present

## 2020-09-17 DIAGNOSIS — R569 Unspecified convulsions: Secondary | ICD-10-CM | POA: Diagnosis not present

## 2020-09-17 DIAGNOSIS — I4891 Unspecified atrial fibrillation: Secondary | ICD-10-CM | POA: Diagnosis not present

## 2020-09-17 DIAGNOSIS — F0390 Unspecified dementia without behavioral disturbance: Secondary | ICD-10-CM | POA: Diagnosis not present

## 2020-09-17 DIAGNOSIS — H409 Unspecified glaucoma: Secondary | ICD-10-CM | POA: Diagnosis not present

## 2020-09-18 DIAGNOSIS — I4891 Unspecified atrial fibrillation: Secondary | ICD-10-CM | POA: Diagnosis not present

## 2020-09-18 DIAGNOSIS — R569 Unspecified convulsions: Secondary | ICD-10-CM | POA: Diagnosis not present

## 2020-09-18 DIAGNOSIS — H409 Unspecified glaucoma: Secondary | ICD-10-CM | POA: Diagnosis not present

## 2020-09-18 DIAGNOSIS — I10 Essential (primary) hypertension: Secondary | ICD-10-CM | POA: Diagnosis not present

## 2020-09-18 DIAGNOSIS — S42202D Unspecified fracture of upper end of left humerus, subsequent encounter for fracture with routine healing: Secondary | ICD-10-CM | POA: Diagnosis not present

## 2020-09-18 DIAGNOSIS — F0390 Unspecified dementia without behavioral disturbance: Secondary | ICD-10-CM | POA: Diagnosis not present

## 2020-09-19 DIAGNOSIS — I10 Essential (primary) hypertension: Secondary | ICD-10-CM | POA: Diagnosis not present

## 2020-09-19 DIAGNOSIS — S42202D Unspecified fracture of upper end of left humerus, subsequent encounter for fracture with routine healing: Secondary | ICD-10-CM | POA: Diagnosis not present

## 2020-09-19 DIAGNOSIS — H409 Unspecified glaucoma: Secondary | ICD-10-CM | POA: Diagnosis not present

## 2020-09-19 DIAGNOSIS — F0390 Unspecified dementia without behavioral disturbance: Secondary | ICD-10-CM | POA: Diagnosis not present

## 2020-09-19 DIAGNOSIS — I4891 Unspecified atrial fibrillation: Secondary | ICD-10-CM | POA: Diagnosis not present

## 2020-09-19 DIAGNOSIS — R569 Unspecified convulsions: Secondary | ICD-10-CM | POA: Diagnosis not present

## 2020-09-20 DIAGNOSIS — I4891 Unspecified atrial fibrillation: Secondary | ICD-10-CM | POA: Diagnosis not present

## 2020-09-20 DIAGNOSIS — R569 Unspecified convulsions: Secondary | ICD-10-CM | POA: Diagnosis not present

## 2020-09-20 DIAGNOSIS — F0390 Unspecified dementia without behavioral disturbance: Secondary | ICD-10-CM | POA: Diagnosis not present

## 2020-09-20 DIAGNOSIS — I10 Essential (primary) hypertension: Secondary | ICD-10-CM | POA: Diagnosis not present

## 2020-09-20 DIAGNOSIS — S42202D Unspecified fracture of upper end of left humerus, subsequent encounter for fracture with routine healing: Secondary | ICD-10-CM | POA: Diagnosis not present

## 2020-09-20 DIAGNOSIS — H409 Unspecified glaucoma: Secondary | ICD-10-CM | POA: Diagnosis not present

## 2020-09-21 DIAGNOSIS — S42202D Unspecified fracture of upper end of left humerus, subsequent encounter for fracture with routine healing: Secondary | ICD-10-CM | POA: Diagnosis not present

## 2020-09-21 DIAGNOSIS — F0390 Unspecified dementia without behavioral disturbance: Secondary | ICD-10-CM | POA: Diagnosis not present

## 2020-09-21 DIAGNOSIS — H409 Unspecified glaucoma: Secondary | ICD-10-CM | POA: Diagnosis not present

## 2020-09-21 DIAGNOSIS — I10 Essential (primary) hypertension: Secondary | ICD-10-CM | POA: Diagnosis not present

## 2020-09-21 DIAGNOSIS — R569 Unspecified convulsions: Secondary | ICD-10-CM | POA: Diagnosis not present

## 2020-09-21 DIAGNOSIS — I4891 Unspecified atrial fibrillation: Secondary | ICD-10-CM | POA: Diagnosis not present

## 2020-09-24 DIAGNOSIS — F0390 Unspecified dementia without behavioral disturbance: Secondary | ICD-10-CM | POA: Diagnosis not present

## 2020-09-24 DIAGNOSIS — H409 Unspecified glaucoma: Secondary | ICD-10-CM | POA: Diagnosis not present

## 2020-09-24 DIAGNOSIS — I10 Essential (primary) hypertension: Secondary | ICD-10-CM | POA: Diagnosis not present

## 2020-09-24 DIAGNOSIS — R569 Unspecified convulsions: Secondary | ICD-10-CM | POA: Diagnosis not present

## 2020-09-24 DIAGNOSIS — I4891 Unspecified atrial fibrillation: Secondary | ICD-10-CM | POA: Diagnosis not present

## 2020-09-24 DIAGNOSIS — S42202D Unspecified fracture of upper end of left humerus, subsequent encounter for fracture with routine healing: Secondary | ICD-10-CM | POA: Diagnosis not present

## 2020-09-25 DIAGNOSIS — R569 Unspecified convulsions: Secondary | ICD-10-CM | POA: Diagnosis not present

## 2020-09-25 DIAGNOSIS — I10 Essential (primary) hypertension: Secondary | ICD-10-CM | POA: Diagnosis not present

## 2020-09-25 DIAGNOSIS — F0390 Unspecified dementia without behavioral disturbance: Secondary | ICD-10-CM | POA: Diagnosis not present

## 2020-09-25 DIAGNOSIS — H409 Unspecified glaucoma: Secondary | ICD-10-CM | POA: Diagnosis not present

## 2020-09-25 DIAGNOSIS — I4891 Unspecified atrial fibrillation: Secondary | ICD-10-CM | POA: Diagnosis not present

## 2020-09-25 DIAGNOSIS — S42202D Unspecified fracture of upper end of left humerus, subsequent encounter for fracture with routine healing: Secondary | ICD-10-CM | POA: Diagnosis not present

## 2020-09-26 DIAGNOSIS — I10 Essential (primary) hypertension: Secondary | ICD-10-CM | POA: Diagnosis not present

## 2020-09-26 DIAGNOSIS — I4891 Unspecified atrial fibrillation: Secondary | ICD-10-CM | POA: Diagnosis not present

## 2020-09-26 DIAGNOSIS — F0390 Unspecified dementia without behavioral disturbance: Secondary | ICD-10-CM | POA: Diagnosis not present

## 2020-09-26 DIAGNOSIS — H409 Unspecified glaucoma: Secondary | ICD-10-CM | POA: Diagnosis not present

## 2020-09-26 DIAGNOSIS — S42202D Unspecified fracture of upper end of left humerus, subsequent encounter for fracture with routine healing: Secondary | ICD-10-CM | POA: Diagnosis not present

## 2020-09-26 DIAGNOSIS — R569 Unspecified convulsions: Secondary | ICD-10-CM | POA: Diagnosis not present

## 2020-09-27 DIAGNOSIS — F0390 Unspecified dementia without behavioral disturbance: Secondary | ICD-10-CM | POA: Diagnosis not present

## 2020-09-27 DIAGNOSIS — S42202D Unspecified fracture of upper end of left humerus, subsequent encounter for fracture with routine healing: Secondary | ICD-10-CM | POA: Diagnosis not present

## 2020-09-27 DIAGNOSIS — R569 Unspecified convulsions: Secondary | ICD-10-CM | POA: Diagnosis not present

## 2020-09-27 DIAGNOSIS — I10 Essential (primary) hypertension: Secondary | ICD-10-CM | POA: Diagnosis not present

## 2020-09-27 DIAGNOSIS — I4891 Unspecified atrial fibrillation: Secondary | ICD-10-CM | POA: Diagnosis not present

## 2020-09-27 DIAGNOSIS — H409 Unspecified glaucoma: Secondary | ICD-10-CM | POA: Diagnosis not present

## 2020-09-28 DIAGNOSIS — R569 Unspecified convulsions: Secondary | ICD-10-CM | POA: Diagnosis not present

## 2020-09-28 DIAGNOSIS — H409 Unspecified glaucoma: Secondary | ICD-10-CM | POA: Diagnosis not present

## 2020-09-28 DIAGNOSIS — F0390 Unspecified dementia without behavioral disturbance: Secondary | ICD-10-CM | POA: Diagnosis not present

## 2020-09-28 DIAGNOSIS — S42202D Unspecified fracture of upper end of left humerus, subsequent encounter for fracture with routine healing: Secondary | ICD-10-CM | POA: Diagnosis not present

## 2020-09-28 DIAGNOSIS — I10 Essential (primary) hypertension: Secondary | ICD-10-CM | POA: Diagnosis not present

## 2020-09-28 DIAGNOSIS — I4891 Unspecified atrial fibrillation: Secondary | ICD-10-CM | POA: Diagnosis not present

## 2020-10-01 DIAGNOSIS — F0390 Unspecified dementia without behavioral disturbance: Secondary | ICD-10-CM | POA: Diagnosis not present

## 2020-10-01 DIAGNOSIS — I4891 Unspecified atrial fibrillation: Secondary | ICD-10-CM | POA: Diagnosis not present

## 2020-10-01 DIAGNOSIS — R569 Unspecified convulsions: Secondary | ICD-10-CM | POA: Diagnosis not present

## 2020-10-01 DIAGNOSIS — I10 Essential (primary) hypertension: Secondary | ICD-10-CM | POA: Diagnosis not present

## 2020-10-01 DIAGNOSIS — H409 Unspecified glaucoma: Secondary | ICD-10-CM | POA: Diagnosis not present

## 2020-10-01 DIAGNOSIS — S42202D Unspecified fracture of upper end of left humerus, subsequent encounter for fracture with routine healing: Secondary | ICD-10-CM | POA: Diagnosis not present

## 2020-10-02 DIAGNOSIS — H409 Unspecified glaucoma: Secondary | ICD-10-CM | POA: Diagnosis not present

## 2020-10-02 DIAGNOSIS — R569 Unspecified convulsions: Secondary | ICD-10-CM | POA: Diagnosis not present

## 2020-10-02 DIAGNOSIS — F0390 Unspecified dementia without behavioral disturbance: Secondary | ICD-10-CM | POA: Diagnosis not present

## 2020-10-02 DIAGNOSIS — I10 Essential (primary) hypertension: Secondary | ICD-10-CM | POA: Diagnosis not present

## 2020-10-02 DIAGNOSIS — S42202D Unspecified fracture of upper end of left humerus, subsequent encounter for fracture with routine healing: Secondary | ICD-10-CM | POA: Diagnosis not present

## 2020-10-02 DIAGNOSIS — I4891 Unspecified atrial fibrillation: Secondary | ICD-10-CM | POA: Diagnosis not present

## 2020-10-16 DIAGNOSIS — Z20822 Contact with and (suspected) exposure to covid-19: Secondary | ICD-10-CM | POA: Diagnosis not present

## 2020-10-17 DIAGNOSIS — I5033 Acute on chronic diastolic (congestive) heart failure: Secondary | ICD-10-CM | POA: Diagnosis not present

## 2020-10-17 DIAGNOSIS — R7309 Other abnormal glucose: Secondary | ICD-10-CM | POA: Diagnosis not present

## 2020-10-17 DIAGNOSIS — E559 Vitamin D deficiency, unspecified: Secondary | ICD-10-CM | POA: Diagnosis not present

## 2020-10-17 DIAGNOSIS — S329XXA Fracture of unspecified parts of lumbosacral spine and pelvis, initial encounter for closed fracture: Secondary | ICD-10-CM | POA: Diagnosis not present

## 2020-10-17 DIAGNOSIS — D62 Acute posthemorrhagic anemia: Secondary | ICD-10-CM | POA: Diagnosis not present

## 2020-10-17 DIAGNOSIS — N390 Urinary tract infection, site not specified: Secondary | ICD-10-CM | POA: Diagnosis not present

## 2020-10-17 DIAGNOSIS — D509 Iron deficiency anemia, unspecified: Secondary | ICD-10-CM | POA: Diagnosis not present

## 2020-10-17 DIAGNOSIS — R569 Unspecified convulsions: Secondary | ICD-10-CM | POA: Diagnosis not present

## 2020-10-17 DIAGNOSIS — I4891 Unspecified atrial fibrillation: Secondary | ICD-10-CM | POA: Diagnosis not present

## 2020-10-17 DIAGNOSIS — E785 Hyperlipidemia, unspecified: Secondary | ICD-10-CM | POA: Diagnosis not present

## 2020-10-17 DIAGNOSIS — F0390 Unspecified dementia without behavioral disturbance: Secondary | ICD-10-CM | POA: Diagnosis not present

## 2020-10-17 DIAGNOSIS — F0391 Unspecified dementia with behavioral disturbance: Secondary | ICD-10-CM | POA: Diagnosis not present

## 2020-10-17 DIAGNOSIS — I10 Essential (primary) hypertension: Secondary | ICD-10-CM | POA: Diagnosis not present

## 2020-10-17 DIAGNOSIS — M810 Age-related osteoporosis without current pathological fracture: Secondary | ICD-10-CM | POA: Diagnosis not present

## 2020-10-17 DIAGNOSIS — Z66 Do not resuscitate: Secondary | ICD-10-CM | POA: Diagnosis not present

## 2020-10-18 DIAGNOSIS — Z20822 Contact with and (suspected) exposure to covid-19: Secondary | ICD-10-CM | POA: Diagnosis not present

## 2020-10-19 DIAGNOSIS — S42202S Unspecified fracture of upper end of left humerus, sequela: Secondary | ICD-10-CM | POA: Diagnosis not present

## 2020-10-21 DIAGNOSIS — R079 Chest pain, unspecified: Secondary | ICD-10-CM | POA: Diagnosis not present

## 2020-11-09 DIAGNOSIS — M810 Age-related osteoporosis without current pathological fracture: Secondary | ICD-10-CM | POA: Diagnosis not present

## 2020-11-09 DIAGNOSIS — G40901 Epilepsy, unspecified, not intractable, with status epilepticus: Secondary | ICD-10-CM | POA: Diagnosis not present

## 2020-11-09 DIAGNOSIS — K219 Gastro-esophageal reflux disease without esophagitis: Secondary | ICD-10-CM | POA: Diagnosis not present

## 2020-11-09 DIAGNOSIS — I480 Paroxysmal atrial fibrillation: Secondary | ICD-10-CM | POA: Diagnosis not present

## 2020-11-09 DIAGNOSIS — G4089 Other seizures: Secondary | ICD-10-CM | POA: Diagnosis not present

## 2020-11-09 DIAGNOSIS — F0390 Unspecified dementia without behavioral disturbance: Secondary | ICD-10-CM | POA: Diagnosis not present

## 2020-11-09 DIAGNOSIS — F329 Major depressive disorder, single episode, unspecified: Secondary | ICD-10-CM | POA: Diagnosis not present

## 2020-12-04 DIAGNOSIS — Z9181 History of falling: Secondary | ICD-10-CM | POA: Diagnosis not present

## 2020-12-04 DIAGNOSIS — R278 Other lack of coordination: Secondary | ICD-10-CM | POA: Diagnosis not present

## 2020-12-04 DIAGNOSIS — F331 Major depressive disorder, recurrent, moderate: Secondary | ICD-10-CM | POA: Diagnosis not present

## 2020-12-04 DIAGNOSIS — F0390 Unspecified dementia without behavioral disturbance: Secondary | ICD-10-CM | POA: Diagnosis not present

## 2020-12-04 DIAGNOSIS — S42202D Unspecified fracture of upper end of left humerus, subsequent encounter for fracture with routine healing: Secondary | ICD-10-CM | POA: Diagnosis not present

## 2020-12-04 DIAGNOSIS — E785 Hyperlipidemia, unspecified: Secondary | ICD-10-CM | POA: Diagnosis not present

## 2020-12-04 DIAGNOSIS — R2689 Other abnormalities of gait and mobility: Secondary | ICD-10-CM | POA: Diagnosis not present

## 2020-12-04 DIAGNOSIS — I4891 Unspecified atrial fibrillation: Secondary | ICD-10-CM | POA: Diagnosis not present

## 2020-12-04 DIAGNOSIS — M6281 Muscle weakness (generalized): Secondary | ICD-10-CM | POA: Diagnosis not present

## 2020-12-04 DIAGNOSIS — R569 Unspecified convulsions: Secondary | ICD-10-CM | POA: Diagnosis not present

## 2020-12-04 DIAGNOSIS — H409 Unspecified glaucoma: Secondary | ICD-10-CM | POA: Diagnosis not present

## 2020-12-04 DIAGNOSIS — R1311 Dysphagia, oral phase: Secondary | ICD-10-CM | POA: Diagnosis not present

## 2020-12-04 DIAGNOSIS — I10 Essential (primary) hypertension: Secondary | ICD-10-CM | POA: Diagnosis not present

## 2020-12-05 DIAGNOSIS — S42202D Unspecified fracture of upper end of left humerus, subsequent encounter for fracture with routine healing: Secondary | ICD-10-CM | POA: Diagnosis not present

## 2020-12-05 DIAGNOSIS — H409 Unspecified glaucoma: Secondary | ICD-10-CM | POA: Diagnosis not present

## 2020-12-05 DIAGNOSIS — I10 Essential (primary) hypertension: Secondary | ICD-10-CM | POA: Diagnosis not present

## 2020-12-05 DIAGNOSIS — I4891 Unspecified atrial fibrillation: Secondary | ICD-10-CM | POA: Diagnosis not present

## 2020-12-05 DIAGNOSIS — F0390 Unspecified dementia without behavioral disturbance: Secondary | ICD-10-CM | POA: Diagnosis not present

## 2020-12-05 DIAGNOSIS — R569 Unspecified convulsions: Secondary | ICD-10-CM | POA: Diagnosis not present

## 2020-12-07 DIAGNOSIS — F0390 Unspecified dementia without behavioral disturbance: Secondary | ICD-10-CM | POA: Diagnosis not present

## 2020-12-07 DIAGNOSIS — I4891 Unspecified atrial fibrillation: Secondary | ICD-10-CM | POA: Diagnosis not present

## 2020-12-07 DIAGNOSIS — I10 Essential (primary) hypertension: Secondary | ICD-10-CM | POA: Diagnosis not present

## 2020-12-07 DIAGNOSIS — R569 Unspecified convulsions: Secondary | ICD-10-CM | POA: Diagnosis not present

## 2020-12-07 DIAGNOSIS — S42202D Unspecified fracture of upper end of left humerus, subsequent encounter for fracture with routine healing: Secondary | ICD-10-CM | POA: Diagnosis not present

## 2020-12-07 DIAGNOSIS — H409 Unspecified glaucoma: Secondary | ICD-10-CM | POA: Diagnosis not present

## 2020-12-10 DIAGNOSIS — I10 Essential (primary) hypertension: Secondary | ICD-10-CM | POA: Diagnosis not present

## 2020-12-10 DIAGNOSIS — H409 Unspecified glaucoma: Secondary | ICD-10-CM | POA: Diagnosis not present

## 2020-12-10 DIAGNOSIS — I4891 Unspecified atrial fibrillation: Secondary | ICD-10-CM | POA: Diagnosis not present

## 2020-12-10 DIAGNOSIS — R569 Unspecified convulsions: Secondary | ICD-10-CM | POA: Diagnosis not present

## 2020-12-10 DIAGNOSIS — F0390 Unspecified dementia without behavioral disturbance: Secondary | ICD-10-CM | POA: Diagnosis not present

## 2020-12-10 DIAGNOSIS — S42202D Unspecified fracture of upper end of left humerus, subsequent encounter for fracture with routine healing: Secondary | ICD-10-CM | POA: Diagnosis not present

## 2020-12-11 DIAGNOSIS — F329 Major depressive disorder, single episode, unspecified: Secondary | ICD-10-CM | POA: Diagnosis not present

## 2020-12-11 DIAGNOSIS — M62830 Muscle spasm of back: Secondary | ICD-10-CM | POA: Diagnosis not present

## 2020-12-11 DIAGNOSIS — S42202D Unspecified fracture of upper end of left humerus, subsequent encounter for fracture with routine healing: Secondary | ICD-10-CM | POA: Diagnosis not present

## 2020-12-11 DIAGNOSIS — Z9181 History of falling: Secondary | ICD-10-CM | POA: Diagnosis not present

## 2020-12-11 DIAGNOSIS — I4891 Unspecified atrial fibrillation: Secondary | ICD-10-CM | POA: Diagnosis not present

## 2020-12-11 DIAGNOSIS — R2689 Other abnormalities of gait and mobility: Secondary | ICD-10-CM | POA: Diagnosis not present

## 2020-12-11 DIAGNOSIS — R1311 Dysphagia, oral phase: Secondary | ICD-10-CM | POA: Diagnosis not present

## 2020-12-11 DIAGNOSIS — F0390 Unspecified dementia without behavioral disturbance: Secondary | ICD-10-CM | POA: Diagnosis not present

## 2020-12-11 DIAGNOSIS — E785 Hyperlipidemia, unspecified: Secondary | ICD-10-CM | POA: Diagnosis not present

## 2020-12-11 DIAGNOSIS — H409 Unspecified glaucoma: Secondary | ICD-10-CM | POA: Diagnosis not present

## 2020-12-11 DIAGNOSIS — I10 Essential (primary) hypertension: Secondary | ICD-10-CM | POA: Diagnosis not present

## 2020-12-11 DIAGNOSIS — R21 Rash and other nonspecific skin eruption: Secondary | ICD-10-CM | POA: Diagnosis not present

## 2020-12-11 DIAGNOSIS — R251 Tremor, unspecified: Secondary | ICD-10-CM | POA: Diagnosis not present

## 2020-12-11 DIAGNOSIS — F331 Major depressive disorder, recurrent, moderate: Secondary | ICD-10-CM | POA: Diagnosis not present

## 2020-12-11 DIAGNOSIS — R278 Other lack of coordination: Secondary | ICD-10-CM | POA: Diagnosis not present

## 2020-12-11 DIAGNOSIS — M6281 Muscle weakness (generalized): Secondary | ICD-10-CM | POA: Diagnosis not present

## 2020-12-11 DIAGNOSIS — R569 Unspecified convulsions: Secondary | ICD-10-CM | POA: Diagnosis not present

## 2020-12-12 DIAGNOSIS — S42202D Unspecified fracture of upper end of left humerus, subsequent encounter for fracture with routine healing: Secondary | ICD-10-CM | POA: Diagnosis not present

## 2020-12-12 DIAGNOSIS — M810 Age-related osteoporosis without current pathological fracture: Secondary | ICD-10-CM | POA: Diagnosis not present

## 2020-12-12 DIAGNOSIS — R569 Unspecified convulsions: Secondary | ICD-10-CM | POA: Diagnosis not present

## 2020-12-12 DIAGNOSIS — F0390 Unspecified dementia without behavioral disturbance: Secondary | ICD-10-CM | POA: Diagnosis not present

## 2020-12-12 DIAGNOSIS — H409 Unspecified glaucoma: Secondary | ICD-10-CM | POA: Diagnosis not present

## 2020-12-12 DIAGNOSIS — I4891 Unspecified atrial fibrillation: Secondary | ICD-10-CM | POA: Diagnosis not present

## 2020-12-12 DIAGNOSIS — I10 Essential (primary) hypertension: Secondary | ICD-10-CM | POA: Diagnosis not present

## 2020-12-13 DIAGNOSIS — F0390 Unspecified dementia without behavioral disturbance: Secondary | ICD-10-CM | POA: Diagnosis not present

## 2020-12-13 DIAGNOSIS — I10 Essential (primary) hypertension: Secondary | ICD-10-CM | POA: Diagnosis not present

## 2020-12-13 DIAGNOSIS — I4891 Unspecified atrial fibrillation: Secondary | ICD-10-CM | POA: Diagnosis not present

## 2020-12-13 DIAGNOSIS — F329 Major depressive disorder, single episode, unspecified: Secondary | ICD-10-CM | POA: Diagnosis not present

## 2020-12-13 DIAGNOSIS — K219 Gastro-esophageal reflux disease without esophagitis: Secondary | ICD-10-CM | POA: Diagnosis not present

## 2020-12-13 DIAGNOSIS — D508 Other iron deficiency anemias: Secondary | ICD-10-CM | POA: Diagnosis not present

## 2020-12-13 DIAGNOSIS — I480 Paroxysmal atrial fibrillation: Secondary | ICD-10-CM | POA: Diagnosis not present

## 2020-12-13 DIAGNOSIS — G4089 Other seizures: Secondary | ICD-10-CM | POA: Diagnosis not present

## 2020-12-13 DIAGNOSIS — H409 Unspecified glaucoma: Secondary | ICD-10-CM | POA: Diagnosis not present

## 2020-12-13 DIAGNOSIS — S42202D Unspecified fracture of upper end of left humerus, subsequent encounter for fracture with routine healing: Secondary | ICD-10-CM | POA: Diagnosis not present

## 2020-12-13 DIAGNOSIS — R569 Unspecified convulsions: Secondary | ICD-10-CM | POA: Diagnosis not present

## 2020-12-18 DIAGNOSIS — S42202D Unspecified fracture of upper end of left humerus, subsequent encounter for fracture with routine healing: Secondary | ICD-10-CM | POA: Diagnosis not present

## 2020-12-18 DIAGNOSIS — R569 Unspecified convulsions: Secondary | ICD-10-CM | POA: Diagnosis not present

## 2020-12-18 DIAGNOSIS — I10 Essential (primary) hypertension: Secondary | ICD-10-CM | POA: Diagnosis not present

## 2020-12-18 DIAGNOSIS — I4891 Unspecified atrial fibrillation: Secondary | ICD-10-CM | POA: Diagnosis not present

## 2020-12-18 DIAGNOSIS — H409 Unspecified glaucoma: Secondary | ICD-10-CM | POA: Diagnosis not present

## 2020-12-18 DIAGNOSIS — F0390 Unspecified dementia without behavioral disturbance: Secondary | ICD-10-CM | POA: Diagnosis not present

## 2020-12-19 DIAGNOSIS — H409 Unspecified glaucoma: Secondary | ICD-10-CM | POA: Diagnosis not present

## 2020-12-19 DIAGNOSIS — I10 Essential (primary) hypertension: Secondary | ICD-10-CM | POA: Diagnosis not present

## 2020-12-19 DIAGNOSIS — I4891 Unspecified atrial fibrillation: Secondary | ICD-10-CM | POA: Diagnosis not present

## 2020-12-19 DIAGNOSIS — R569 Unspecified convulsions: Secondary | ICD-10-CM | POA: Diagnosis not present

## 2020-12-19 DIAGNOSIS — S42202D Unspecified fracture of upper end of left humerus, subsequent encounter for fracture with routine healing: Secondary | ICD-10-CM | POA: Diagnosis not present

## 2020-12-19 DIAGNOSIS — F0390 Unspecified dementia without behavioral disturbance: Secondary | ICD-10-CM | POA: Diagnosis not present

## 2020-12-20 DIAGNOSIS — R569 Unspecified convulsions: Secondary | ICD-10-CM | POA: Diagnosis not present

## 2020-12-20 DIAGNOSIS — F0390 Unspecified dementia without behavioral disturbance: Secondary | ICD-10-CM | POA: Diagnosis not present

## 2020-12-20 DIAGNOSIS — I10 Essential (primary) hypertension: Secondary | ICD-10-CM | POA: Diagnosis not present

## 2020-12-20 DIAGNOSIS — S42202D Unspecified fracture of upper end of left humerus, subsequent encounter for fracture with routine healing: Secondary | ICD-10-CM | POA: Diagnosis not present

## 2020-12-20 DIAGNOSIS — H409 Unspecified glaucoma: Secondary | ICD-10-CM | POA: Diagnosis not present

## 2020-12-20 DIAGNOSIS — I4891 Unspecified atrial fibrillation: Secondary | ICD-10-CM | POA: Diagnosis not present

## 2020-12-25 DIAGNOSIS — F0390 Unspecified dementia without behavioral disturbance: Secondary | ICD-10-CM | POA: Diagnosis not present

## 2020-12-25 DIAGNOSIS — H409 Unspecified glaucoma: Secondary | ICD-10-CM | POA: Diagnosis not present

## 2020-12-25 DIAGNOSIS — I10 Essential (primary) hypertension: Secondary | ICD-10-CM | POA: Diagnosis not present

## 2020-12-25 DIAGNOSIS — S42202D Unspecified fracture of upper end of left humerus, subsequent encounter for fracture with routine healing: Secondary | ICD-10-CM | POA: Diagnosis not present

## 2020-12-25 DIAGNOSIS — I4891 Unspecified atrial fibrillation: Secondary | ICD-10-CM | POA: Diagnosis not present

## 2020-12-25 DIAGNOSIS — R569 Unspecified convulsions: Secondary | ICD-10-CM | POA: Diagnosis not present

## 2020-12-26 DIAGNOSIS — S42202D Unspecified fracture of upper end of left humerus, subsequent encounter for fracture with routine healing: Secondary | ICD-10-CM | POA: Diagnosis not present

## 2020-12-26 DIAGNOSIS — I10 Essential (primary) hypertension: Secondary | ICD-10-CM | POA: Diagnosis not present

## 2020-12-26 DIAGNOSIS — I4891 Unspecified atrial fibrillation: Secondary | ICD-10-CM | POA: Diagnosis not present

## 2020-12-26 DIAGNOSIS — F0390 Unspecified dementia without behavioral disturbance: Secondary | ICD-10-CM | POA: Diagnosis not present

## 2020-12-26 DIAGNOSIS — R569 Unspecified convulsions: Secondary | ICD-10-CM | POA: Diagnosis not present

## 2020-12-26 DIAGNOSIS — H409 Unspecified glaucoma: Secondary | ICD-10-CM | POA: Diagnosis not present

## 2020-12-27 DIAGNOSIS — S42202S Unspecified fracture of upper end of left humerus, sequela: Secondary | ICD-10-CM | POA: Diagnosis not present

## 2020-12-29 DIAGNOSIS — S42202D Unspecified fracture of upper end of left humerus, subsequent encounter for fracture with routine healing: Secondary | ICD-10-CM | POA: Diagnosis not present

## 2020-12-29 DIAGNOSIS — I4891 Unspecified atrial fibrillation: Secondary | ICD-10-CM | POA: Diagnosis not present

## 2020-12-29 DIAGNOSIS — R569 Unspecified convulsions: Secondary | ICD-10-CM | POA: Diagnosis not present

## 2020-12-29 DIAGNOSIS — F0390 Unspecified dementia without behavioral disturbance: Secondary | ICD-10-CM | POA: Diagnosis not present

## 2020-12-29 DIAGNOSIS — H409 Unspecified glaucoma: Secondary | ICD-10-CM | POA: Diagnosis not present

## 2020-12-29 DIAGNOSIS — I10 Essential (primary) hypertension: Secondary | ICD-10-CM | POA: Diagnosis not present

## 2021-01-01 DIAGNOSIS — S42202D Unspecified fracture of upper end of left humerus, subsequent encounter for fracture with routine healing: Secondary | ICD-10-CM | POA: Diagnosis not present

## 2021-01-01 DIAGNOSIS — R569 Unspecified convulsions: Secondary | ICD-10-CM | POA: Diagnosis not present

## 2021-01-01 DIAGNOSIS — I10 Essential (primary) hypertension: Secondary | ICD-10-CM | POA: Diagnosis not present

## 2021-01-01 DIAGNOSIS — F0390 Unspecified dementia without behavioral disturbance: Secondary | ICD-10-CM | POA: Diagnosis not present

## 2021-01-01 DIAGNOSIS — H409 Unspecified glaucoma: Secondary | ICD-10-CM | POA: Diagnosis not present

## 2021-01-01 DIAGNOSIS — I4891 Unspecified atrial fibrillation: Secondary | ICD-10-CM | POA: Diagnosis not present

## 2021-01-02 DIAGNOSIS — I480 Paroxysmal atrial fibrillation: Secondary | ICD-10-CM | POA: Diagnosis not present

## 2021-01-02 DIAGNOSIS — F329 Major depressive disorder, single episode, unspecified: Secondary | ICD-10-CM | POA: Diagnosis not present

## 2021-01-02 DIAGNOSIS — K219 Gastro-esophageal reflux disease without esophagitis: Secondary | ICD-10-CM | POA: Diagnosis not present

## 2021-01-02 DIAGNOSIS — F0390 Unspecified dementia without behavioral disturbance: Secondary | ICD-10-CM | POA: Diagnosis not present

## 2021-01-02 DIAGNOSIS — H409 Unspecified glaucoma: Secondary | ICD-10-CM | POA: Diagnosis not present

## 2021-01-02 DIAGNOSIS — S42202D Unspecified fracture of upper end of left humerus, subsequent encounter for fracture with routine healing: Secondary | ICD-10-CM | POA: Diagnosis not present

## 2021-01-02 DIAGNOSIS — R569 Unspecified convulsions: Secondary | ICD-10-CM | POA: Diagnosis not present

## 2021-01-02 DIAGNOSIS — I10 Essential (primary) hypertension: Secondary | ICD-10-CM | POA: Diagnosis not present

## 2021-01-02 DIAGNOSIS — I4891 Unspecified atrial fibrillation: Secondary | ICD-10-CM | POA: Diagnosis not present

## 2021-01-03 ENCOUNTER — Inpatient Hospital Stay (HOSPITAL_COMMUNITY)
Admission: EM | Admit: 2021-01-03 | Discharge: 2021-01-07 | DRG: 871 | Disposition: A | Discharge status: Skilled Nursing Facility | Payer: Medicare Other | Attending: Internal Medicine | Admitting: Internal Medicine

## 2021-01-03 ENCOUNTER — Encounter (HOSPITAL_COMMUNITY): Payer: Self-pay

## 2021-01-03 ENCOUNTER — Emergency Department (HOSPITAL_COMMUNITY): Payer: Medicare Other

## 2021-01-03 ENCOUNTER — Other Ambulatory Visit: Payer: Self-pay

## 2021-01-03 DIAGNOSIS — R278 Other lack of coordination: Secondary | ICD-10-CM | POA: Diagnosis not present

## 2021-01-03 DIAGNOSIS — R6520 Severe sepsis without septic shock: Secondary | ICD-10-CM

## 2021-01-03 DIAGNOSIS — L89322 Pressure ulcer of left buttock, stage 2: Secondary | ICD-10-CM | POA: Diagnosis present

## 2021-01-03 DIAGNOSIS — R0902 Hypoxemia: Secondary | ICD-10-CM | POA: Diagnosis not present

## 2021-01-03 DIAGNOSIS — H409 Unspecified glaucoma: Secondary | ICD-10-CM | POA: Diagnosis present

## 2021-01-03 DIAGNOSIS — F0390 Unspecified dementia without behavioral disturbance: Secondary | ICD-10-CM | POA: Diagnosis present

## 2021-01-03 DIAGNOSIS — D6859 Other primary thrombophilia: Secondary | ICD-10-CM | POA: Diagnosis present

## 2021-01-03 DIAGNOSIS — I10 Essential (primary) hypertension: Secondary | ICD-10-CM | POA: Diagnosis not present

## 2021-01-03 DIAGNOSIS — E872 Acidosis, unspecified: Secondary | ICD-10-CM | POA: Diagnosis present

## 2021-01-03 DIAGNOSIS — Z881 Allergy status to other antibiotic agents status: Secondary | ICD-10-CM

## 2021-01-03 DIAGNOSIS — K219 Gastro-esophageal reflux disease without esophagitis: Secondary | ICD-10-CM | POA: Diagnosis present

## 2021-01-03 DIAGNOSIS — E876 Hypokalemia: Secondary | ICD-10-CM | POA: Diagnosis not present

## 2021-01-03 DIAGNOSIS — R195 Other fecal abnormalities: Secondary | ICD-10-CM | POA: Diagnosis not present

## 2021-01-03 DIAGNOSIS — I129 Hypertensive chronic kidney disease with stage 1 through stage 4 chronic kidney disease, or unspecified chronic kidney disease: Secondary | ICD-10-CM | POA: Diagnosis present

## 2021-01-03 DIAGNOSIS — I4510 Unspecified right bundle-branch block: Secondary | ICD-10-CM | POA: Diagnosis not present

## 2021-01-03 DIAGNOSIS — A419 Sepsis, unspecified organism: Secondary | ICD-10-CM | POA: Diagnosis not present

## 2021-01-03 DIAGNOSIS — R404 Transient alteration of awareness: Secondary | ICD-10-CM | POA: Diagnosis not present

## 2021-01-03 DIAGNOSIS — R4020 Unspecified coma: Secondary | ICD-10-CM | POA: Diagnosis not present

## 2021-01-03 DIAGNOSIS — Z888 Allergy status to other drugs, medicaments and biological substances status: Secondary | ICD-10-CM

## 2021-01-03 DIAGNOSIS — R197 Diarrhea, unspecified: Secondary | ICD-10-CM | POA: Diagnosis not present

## 2021-01-03 DIAGNOSIS — D75839 Thrombocytosis, unspecified: Secondary | ICD-10-CM | POA: Diagnosis present

## 2021-01-03 DIAGNOSIS — M810 Age-related osteoporosis without current pathological fracture: Secondary | ICD-10-CM | POA: Diagnosis present

## 2021-01-03 DIAGNOSIS — U071 COVID-19: Secondary | ICD-10-CM | POA: Diagnosis not present

## 2021-01-03 DIAGNOSIS — D649 Anemia, unspecified: Secondary | ICD-10-CM | POA: Diagnosis present

## 2021-01-03 DIAGNOSIS — R64 Cachexia: Secondary | ICD-10-CM | POA: Diagnosis present

## 2021-01-03 DIAGNOSIS — Z681 Body mass index (BMI) 19 or less, adult: Secondary | ICD-10-CM

## 2021-01-03 DIAGNOSIS — G4089 Other seizures: Secondary | ICD-10-CM | POA: Diagnosis not present

## 2021-01-03 DIAGNOSIS — I480 Paroxysmal atrial fibrillation: Secondary | ICD-10-CM | POA: Diagnosis present

## 2021-01-03 DIAGNOSIS — Z66 Do not resuscitate: Secondary | ICD-10-CM | POA: Diagnosis present

## 2021-01-03 DIAGNOSIS — S42202D Unspecified fracture of upper end of left humerus, subsequent encounter for fracture with routine healing: Secondary | ICD-10-CM | POA: Diagnosis not present

## 2021-01-03 DIAGNOSIS — I499 Cardiac arrhythmia, unspecified: Secondary | ICD-10-CM | POA: Diagnosis not present

## 2021-01-03 DIAGNOSIS — S42209D Unspecified fracture of upper end of unspecified humerus, subsequent encounter for fracture with routine healing: Secondary | ICD-10-CM

## 2021-01-03 DIAGNOSIS — E785 Hyperlipidemia, unspecified: Secondary | ICD-10-CM | POA: Diagnosis present

## 2021-01-03 DIAGNOSIS — L89312 Pressure ulcer of right buttock, stage 2: Secondary | ICD-10-CM | POA: Diagnosis present

## 2021-01-03 DIAGNOSIS — Z79899 Other long term (current) drug therapy: Secondary | ICD-10-CM

## 2021-01-03 DIAGNOSIS — G9341 Metabolic encephalopathy: Secondary | ICD-10-CM | POA: Diagnosis not present

## 2021-01-03 DIAGNOSIS — N1831 Chronic kidney disease, stage 3a: Secondary | ICD-10-CM | POA: Diagnosis not present

## 2021-01-03 DIAGNOSIS — I4891 Unspecified atrial fibrillation: Secondary | ICD-10-CM | POA: Diagnosis not present

## 2021-01-03 DIAGNOSIS — M6281 Muscle weakness (generalized): Secondary | ICD-10-CM | POA: Diagnosis not present

## 2021-01-03 DIAGNOSIS — R279 Unspecified lack of coordination: Secondary | ICD-10-CM | POA: Diagnosis not present

## 2021-01-03 DIAGNOSIS — Z882 Allergy status to sulfonamides status: Secondary | ICD-10-CM

## 2021-01-03 DIAGNOSIS — N289 Disorder of kidney and ureter, unspecified: Secondary | ICD-10-CM | POA: Diagnosis not present

## 2021-01-03 DIAGNOSIS — R000 Tachycardia, unspecified: Secondary | ICD-10-CM | POA: Diagnosis not present

## 2021-01-03 DIAGNOSIS — L8990 Pressure ulcer of unspecified site, unspecified stage: Secondary | ICD-10-CM | POA: Insufficient documentation

## 2021-01-03 DIAGNOSIS — Z20822 Contact with and (suspected) exposure to covid-19: Secondary | ICD-10-CM | POA: Diagnosis present

## 2021-01-03 DIAGNOSIS — R569 Unspecified convulsions: Secondary | ICD-10-CM | POA: Diagnosis not present

## 2021-01-03 DIAGNOSIS — N179 Acute kidney failure, unspecified: Secondary | ICD-10-CM | POA: Diagnosis present

## 2021-01-03 DIAGNOSIS — N390 Urinary tract infection, site not specified: Secondary | ICD-10-CM | POA: Diagnosis not present

## 2021-01-03 DIAGNOSIS — R4182 Altered mental status, unspecified: Secondary | ICD-10-CM

## 2021-01-03 DIAGNOSIS — I517 Cardiomegaly: Secondary | ICD-10-CM | POA: Diagnosis not present

## 2021-01-03 DIAGNOSIS — L89302 Pressure ulcer of unspecified buttock, stage 2: Secondary | ICD-10-CM | POA: Diagnosis not present

## 2021-01-03 DIAGNOSIS — R059 Cough, unspecified: Secondary | ICD-10-CM | POA: Diagnosis not present

## 2021-01-03 DIAGNOSIS — F0391 Unspecified dementia with behavioral disturbance: Secondary | ICD-10-CM | POA: Diagnosis not present

## 2021-01-03 DIAGNOSIS — Z743 Need for continuous supervision: Secondary | ICD-10-CM | POA: Diagnosis not present

## 2021-01-03 DIAGNOSIS — F331 Major depressive disorder, recurrent, moderate: Secondary | ICD-10-CM | POA: Diagnosis not present

## 2021-01-03 DIAGNOSIS — R410 Disorientation, unspecified: Secondary | ICD-10-CM | POA: Diagnosis not present

## 2021-01-03 LAB — COMPREHENSIVE METABOLIC PANEL
ALT: 11 U/L (ref 0–44)
AST: 13 U/L — ABNORMAL LOW (ref 15–41)
Albumin: 3.5 g/dL (ref 3.5–5.0)
Alkaline Phosphatase: 82 U/L (ref 38–126)
Anion gap: 15 (ref 5–15)
BUN: 116 mg/dL — ABNORMAL HIGH (ref 8–23)
CO2: 15 mmol/L — ABNORMAL LOW (ref 22–32)
Calcium: 9.6 mg/dL (ref 8.9–10.3)
Chloride: 107 mmol/L (ref 98–111)
Creatinine, Ser: 2.31 mg/dL — ABNORMAL HIGH (ref 0.44–1.00)
GFR, Estimated: 19 mL/min — ABNORMAL LOW (ref >60)
Glucose, Bld: 137 mg/dL — ABNORMAL HIGH (ref 70–99)
Potassium: 3.6 mmol/L (ref 3.5–5.1)
Sodium: 137 mmol/L (ref 135–145)
Total Bilirubin: 0.6 mg/dL (ref 0.3–1.2)
Total Protein: 8.5 g/dL — ABNORMAL HIGH (ref 6.5–8.1)

## 2021-01-03 LAB — RESP PANEL BY RT-PCR (FLU A&B, COVID) ARPGX2
Influenza A by PCR: NEGATIVE
Influenza B by PCR: NEGATIVE
SARS Coronavirus 2 by RT PCR: NEGATIVE

## 2021-01-03 LAB — CBC WITH DIFFERENTIAL/PLATELET
Abs Immature Granulocytes: 0.14 10*3/uL — ABNORMAL HIGH (ref 0.00–0.07)
Basophils Absolute: 0 10*3/uL (ref 0.0–0.1)
Basophils Relative: 0 %
Eosinophils Absolute: 0 10*3/uL (ref 0.0–0.5)
Eosinophils Relative: 0 %
HCT: 36.8 % (ref 36.0–46.0)
Hemoglobin: 11 g/dL — ABNORMAL LOW (ref 12.0–15.0)
Immature Granulocytes: 1 %
Lymphocytes Relative: 12 %
Lymphs Abs: 2.6 10*3/uL (ref 0.7–4.0)
MCH: 25 pg — ABNORMAL LOW (ref 26.0–34.0)
MCHC: 29.9 g/dL — ABNORMAL LOW (ref 30.0–36.0)
MCV: 83.6 fL (ref 80.0–100.0)
Monocytes Absolute: 1.3 10*3/uL — ABNORMAL HIGH (ref 0.1–1.0)
Monocytes Relative: 6 %
Neutro Abs: 17.8 10*3/uL — ABNORMAL HIGH (ref 1.7–7.7)
Neutrophils Relative %: 81 %
Platelets: 412 10*3/uL — ABNORMAL HIGH (ref 150–400)
RBC: 4.4 MIL/uL (ref 3.87–5.11)
RDW: 15.8 % — ABNORMAL HIGH (ref 11.5–15.5)
WBC: 21.9 10*3/uL — ABNORMAL HIGH (ref 4.0–10.5)
nRBC: 0 % (ref 0.0–0.2)

## 2021-01-03 LAB — URINALYSIS, ROUTINE W REFLEX MICROSCOPIC
Bilirubin Urine: NEGATIVE
Glucose, UA: NEGATIVE mg/dL
Hgb urine dipstick: NEGATIVE
Ketones, ur: NEGATIVE mg/dL
Nitrite: NEGATIVE
Protein, ur: 30 mg/dL — AB
Specific Gravity, Urine: 1.02 (ref 1.005–1.030)
pH: 5 (ref 5.0–8.0)

## 2021-01-03 LAB — TROPONIN I (HIGH SENSITIVITY)
Troponin I (High Sensitivity): 11 ng/L (ref <18)
Troponin I (High Sensitivity): 11 ng/L (ref <18)

## 2021-01-03 LAB — LACTIC ACID, PLASMA: Lactic Acid, Venous: 1.9 mmol/L (ref 0.5–1.9)

## 2021-01-03 LAB — DIGOXIN LEVEL: Digoxin Level: 1.2 ng/mL (ref 0.8–2.0)

## 2021-01-03 MED ORDER — LACTATED RINGERS IV BOLUS (SEPSIS)
1000.0000 mL | Freq: Once | INTRAVENOUS | Status: AC
  Administered 2021-01-03: 1000 mL via INTRAVENOUS

## 2021-01-03 MED ORDER — ACETAMINOPHEN 650 MG RE SUPP: 650.0000 mg | Freq: Four times a day (QID) | RECTAL | Status: DC | PRN

## 2021-01-03 MED ORDER — ONDANSETRON HCL 4 MG/2ML IJ SOLN: 4.0000 mg | Freq: Four times a day (QID) | INTRAMUSCULAR | Status: DC | PRN

## 2021-01-03 MED ORDER — SODIUM CHLORIDE 0.9% FLUSH
3.0000 mL | Freq: Two times a day (BID) | INTRAVENOUS | Status: DC
  Administered 2021-01-03 – 2021-01-07 (×7): 3 mL via INTRAVENOUS

## 2021-01-03 MED ORDER — SODIUM CHLORIDE 0.9 % IV SOLN: INTRAVENOUS | Status: DC

## 2021-01-03 MED ORDER — VANCOMYCIN HCL IN DEXTROSE 1-5 GM/200ML-% IV SOLN
1000.0000 mg | Freq: Once | INTRAVENOUS | Status: AC
  Administered 2021-01-03: 1000 mg via INTRAVENOUS
  Filled 2021-01-03: qty 200

## 2021-01-03 MED ORDER — METRONIDAZOLE IN NACL 5-0.79 MG/ML-% IV SOLN
500.0000 mg | Freq: Once | INTRAVENOUS | Status: AC
  Administered 2021-01-03: 500 mg via INTRAVENOUS
  Filled 2021-01-03: qty 100

## 2021-01-03 MED ORDER — ACETAMINOPHEN 325 MG PO TABS: 650.0000 mg | ORAL_TABLET | Freq: Four times a day (QID) | ORAL | Status: DC | PRN

## 2021-01-03 MED ORDER — SODIUM CHLORIDE 0.9 % IV SOLN
1.0000 g | INTRAVENOUS | Status: DC
  Administered 2021-01-04 – 2021-01-07 (×4): 1 g via INTRAVENOUS
  Filled 2021-01-03 (×3): qty 10
  Filled 2021-01-03: qty 1

## 2021-01-03 MED ORDER — SENNOSIDES-DOCUSATE SODIUM 8.6-50 MG PO TABS: 1.0000 | ORAL_TABLET | Freq: Every evening | ORAL | Status: DC | PRN

## 2021-01-03 MED ORDER — HEPARIN SODIUM (PORCINE) 5000 UNIT/ML IJ SOLN
5000.0000 [IU] | Freq: Three times a day (TID) | INTRAMUSCULAR | Status: DC
  Administered 2021-01-03 – 2021-01-04 (×4): 5000 [IU] via SUBCUTANEOUS
  Filled 2021-01-03 (×4): qty 1

## 2021-01-03 MED ORDER — ALBUMIN HUMAN 25 % IV SOLN
25.0000 g | Freq: Once | INTRAVENOUS | Status: AC
  Administered 2021-01-03: 25 g via INTRAVENOUS
  Filled 2021-01-03: qty 100

## 2021-01-03 MED ORDER — LEVETIRACETAM 500 MG PO TABS
500.0000 mg | ORAL_TABLET | Freq: Two times a day (BID) | ORAL | Status: DC
  Administered 2021-01-04 – 2021-01-07 (×7): 500 mg via ORAL
  Filled 2021-01-03 (×7): qty 1

## 2021-01-03 MED ORDER — SODIUM CHLORIDE 0.9 % IV SOLN
2.0000 g | Freq: Once | INTRAVENOUS | Status: AC
  Administered 2021-01-03: 2 g via INTRAVENOUS
  Filled 2021-01-03: qty 2

## 2021-01-03 MED ORDER — ONDANSETRON HCL 4 MG PO TABS: 4.0000 mg | ORAL_TABLET | Freq: Four times a day (QID) | ORAL | Status: DC | PRN

## 2021-01-03 MED ORDER — LEVETIRACETAM 500 MG PO TABS: 500.0000 mg | ORAL_TABLET | Freq: Two times a day (BID) | ORAL | Status: DC

## 2021-01-03 MED ORDER — LACTATED RINGERS IV SOLN: INTRAVENOUS | Status: DC

## 2021-01-03 MED ORDER — STERILE WATER FOR INJECTION IV SOLN
INTRAVENOUS | Status: DC
  Filled 2021-01-03: qty 150
  Filled 2021-01-03: qty 850
  Filled 2021-01-03: qty 150

## 2021-01-03 MED ORDER — LEVETIRACETAM IN NACL 500 MG/100ML IV SOLN
500.0000 mg | Freq: Once | INTRAVENOUS | Status: AC
  Administered 2021-01-03: 500 mg via INTRAVENOUS
  Filled 2021-01-03: qty 100

## 2021-01-03 NOTE — ED Notes (Signed)
Rectal temp taken on patient, noted to be 96.4 Double checked for accuracy, determined to be correct Hospitalist notified of finding, acknowledged by MD Bair hugger placed on patient Will reassess temp and continue to monitor patient

## 2021-01-03 NOTE — ED Notes (Signed)
Changed pt brief, pt hooked up on pure wick, pt in NAD

## 2021-01-03 NOTE — ED Provider Notes (Signed)
[facility]-EMERGENCY DEPT Provider Note   CSN: [id number] Arrival date & time: 01/03/21  1800     History Chief Complaint  Patient presents with  . Altered Mental Status    Kendra Smith is a 87 y.o. female.  87 year old female presents from nursing home with altered mental status.  Had blood work today performed which showed elevated BUN and creatinine above her normal.  She does have a history of dementia as well as has a valid DNR.  Patient is noncommunicative at this time.  No further history obtainable        Past Medical History:  Diagnosis Date  . Depression   . Glaucoma   . Hyperlipidemia   . Hypertension   . Osteoporosis     Patient Active Problem List   Diagnosis Date Noted  . Seizure (HCC) 05/28/2020  . Weakness generalized   . GI bleed 08/30/2019  . UTI (urinary tract infection) 08/30/2019  . Hypokalemia 08/30/2019  . Demand ischemia (HCC) 08/30/2019  . Palliative care by specialist   . COVID-19   . DNR (do not resuscitate)   . Adult failure to thrive   . Left acetabular fracture (HCC) 08/23/2019  . Pelvic rami fracture (HCC) 08/23/2019  . PAF (paroxysmal atrial fibrillation) (HCC) 08/23/2019  . Rib fracture-12 01/12/2019  . Fracture of transverse process of lumbar vertebra L1-L2 01/12/2019  . Dementia (HCC) 01/12/2019  . Fall 01/12/2019  . Leukocytosis 01/12/2019  . Depression   . Hypertension     Past Surgical History:  Procedure Laterality Date  . ABDOMINAL HYSTERECTOMY    . ANKLE ARTHROSCOPY    . CHOLECYSTECTOMY    . EYE SURGERY    . RECTOCELE REPAIR       OB History   No obstetric history on file.     No family history on file.  Social History   Tobacco Use  . Smoking status: Never Smoker  . Smokeless tobacco: Never Used  Vaping Use  . Vaping Use: Never used  Substance Use Topics  . Alcohol use: No  . Drug use: No    Home Medications Prior to Admission medications   Medication Sig Start Date  End Date Taking? Authorizing Provider  acetaminophen (TYLENOL) 500 MG tablet Take 1,000 mg by mouth every 6 (six) hours as needed for mild pain, fever or headache.     [provider]  calcitonin, salmon, (MIACALCIN/FORTICAL) 200 UNIT/ACT nasal spray Place 1 spray into alternate nostrils daily.    [provider]  calcium carbonate (OSCAL) 1500 (600 Ca) MG TABS tablet Take 600 mg of elemental calcium by mouth daily with breakfast.    [provider]  cephALEXin (KEFLEX) 250 MG capsule Take 1 capsule (250 mg total) by mouth 3 (three) times daily. Take until 06/02/20 Patient not taking: Reported on 07/30/2020 06/01/20   Maurilio Lovely D, DO  cholecalciferol (VITAMIN D3) 25 MCG (1000 UT) tablet Take 1,000 Units by mouth daily with breakfast.     [provider]  digoxin (LANOXIN) 0.125 MG tablet Take 0.125 mg by mouth every other day.     [provider]  donepezil (ARICEPT) 5 MG tablet Take 5 mg by mouth at bedtime. 10/15/18   [provider]  DULoxetine (CYMBALTA) 60 MG capsule Take 60 mg by mouth daily with breakfast.  05/22/14   [provider]  feeding supplement, ENSURE ENLIVE, (ENSURE ENLIVE) LIQD Take 237 mLs by mouth 2 (two) times daily between meals. Patient not  taking: Reported on 05/28/2020 09/01/19   Arrien, York RamMauricio Daniel, MD  HYDROcodone-acetaminophen (NORCO/VICODIN) 5-325 MG tablet Take 1 tablet by mouth See admin instructions. Take 1 tablet by mouth two times a day (9 AM and 9 PM) and every 12 hours as needed for pain 06/01/20   Sherryll BurgerShah, Pratik D, DO  HYDROcodone-acetaminophen (NORCO/VICODIN) 5-325 MG tablet Take 1-2 tablets by mouth every 6 (six) hours as needed. 07/30/20   Wynetta FinesMessick, Peter C, MD  Infant Care Products [facility](DERMACLOUD EX) Apply 1 application topically See admin instructions. Dermacloud ointment- Apply to buttocks after each incontinent episode    [provider]  latanoprost (XALATAN) 0.005 % ophthalmic solution Place  1 drop into both eyes at bedtime.    [provider]  levETIRAcetam (KEPPRA) 500 MG tablet Take 1 tablet (500 mg total) by mouth 2 (two) times daily. 06/01/20 07/30/20  Sherryll BurgerShah, Pratik D, DO  NON FORMULARY Take 120 mLs by mouth See admin instructions. MedPass- Drink 120 ml's by mouth two times a day    [provider]  nystatin (NYSTATIN) powder Apply 1 application topically 2 (two) times daily as needed (rash).    [provider]  pantoprazole (PROTONIX) 40 MG tablet Take 1 tablet (40 mg total) by mouth daily. 09/02/19 07/30/20  Arrien, York RamMauricio Daniel, MD  pregabalin (LYRICA) 50 MG capsule Take 50 mg by mouth 2 (two) times daily.    [provider]  timolol (TIMOPTIC) 0.5 % ophthalmic solution Place 1 drop into both eyes daily. 06/15/14   [provider]  traMADol (ULTRAM) 50 MG tablet Take 1 tablet (50 mg total) by mouth every 8 (eight) hours as needed for severe pain. 06/01/20   Sherryll BurgerShah, Pratik D, DO  traZODone (DESYREL) 50 MG tablet Take 25 mg by mouth every evening.  10/15/18   [provider]    Allergies    Ciprofloxacin hcl, Effexor [venlafaxine], Gantrisin [sulfisoxazole], Levaquin [levofloxacin], and Sulfa antibiotics  Review of Systems   Review of Systems  Unable to perform ROS: Dementia    Physical Exam Updated Vital Signs BP (!) 145/98 (BP Location: Left Arm)   Pulse (!) 105   Temp 97.8 F (36.6 C) (Oral)   Resp 15   Ht 1.6 m (5\' 3" )   Wt 58 kg   SpO2 97%   BMI 22.65 kg/m   Physical Exam Vitals and nursing note reviewed.  Constitutional:      General: She is not in acute distress.    Appearance: She is well-nourished. She is cachectic. She is not toxic-appearing.  HENT:     Head: Normocephalic and atraumatic.  Eyes:     General: Lids are normal.     Extraocular Movements: EOM normal.     Conjunctiva/sclera: Conjunctivae normal.     Pupils: Pupils are equal, round, and reactive to light.  Neck:     Thyroid: No thyroid  mass.     Trachea: No tracheal deviation.  Cardiovascular:     Rate and Rhythm: Tachycardia present. Rhythm irregular.     Heart sounds: Normal heart sounds. No murmur heard. No gallop.   Pulmonary:     Effort: Pulmonary effort is normal. No respiratory distress.     Breath sounds: Normal breath sounds. No stridor. No decreased breath sounds, wheezing, rhonchi or rales.  Abdominal:     General: Bowel sounds are normal. There is no distension.     Palpations: Abdomen is soft.     Tenderness: There is no abdominal tenderness. There is  no CVA tenderness or rebound.  Musculoskeletal:        General: No tenderness or edema. Normal range of motion.     Cervical back: Normal range of motion and neck supple.  Skin:    General: Skin is warm and dry.     Findings: No abrasion or rash.  Neurological:     Mental Status: She is lethargic, disoriented and confused.     GCS: GCS eye subscore is 4. GCS verbal subscore is 5. GCS motor subscore is 6.     Cranial Nerves: No cranial nerve deficit.     Sensory: No sensory deficit.     Deep Tendon Reflexes: Strength normal.     Comments: Withdraws to pain in all four extremities  Psychiatric:        Attention and Perception: She is inattentive.        Mood and Affect: Mood and affect normal.     ED Results / Procedures / Treatments   Labs (all labs ordered are listed, but only abnormal results are displayed) Labs Reviewed  URINE CULTURE  RESP PANEL BY RT-PCR (FLU A&B, COVID) ARPGX2  URINALYSIS, ROUTINE W REFLEX MICROSCOPIC  CBC WITH DIFFERENTIAL/PLATELET  COMPREHENSIVE METABOLIC PANEL  DIGOXIN LEVEL  TROPONIN I (HIGH SENSITIVITY)    EKG EKG Interpretation  Date/Time:  Thursday January 03 2021 18:09:17 EST Ventricular Rate:  107 PR Interval:    QRS Duration: 129 QT Interval:  358 QTC Calculation: 478 R Axis:   -50 Text Interpretation: Ectopic atrial tachycardia, unifocal Supraventricular bigeminy IVCD, consider atypical RBBB LVH with  IVCD, LAD and secondary repol abnrm Probable RV involvement, suggest recording right precordial leads Confirmed by Lorre Nick ([id number]) on 01/03/2021 6:33:11 PM   Radiology No results found.  Procedures Procedures   Medications Ordered in ED Medications  0.9 %  sodium chloride infusion (has no administration in time range)    ED Course  I have reviewed the triage vital signs and the nursing notes.  Pertinent labs & imaging results that were available during my care of the patient were reviewed by me and considered in my medical decision making (see chart for details).    MDM Rules/Calculators/A&P                         Patient with low-grade temperature here as well as leukocytosis.  Code sepsis protocol started.  Also has evidence of acute kidney injury.  Head CT negative.  EKG had questionable changes but given the fact that she is a DNR and is unresponsive and with a negative troponin, very low suspicion that she is having ACS.Marland Kitchen  Chest x-ray without acute findings and urinalysis does show increased white blood cells as well as moderate leukocyte esterase.  Will admit to the hospital service  CRITICAL CARE Performed by: Toy Baker Total critical care time: 55 minutes Critical care time was exclusive of separately billable procedures and treating other patients. Critical care was necessary to treat or prevent imminent or life-threatening deterioration. Critical care was time spent personally by me on the following activities: development of treatment plan with patient and/or surrogate as well as nursing, discussions with consultants, evaluation of patient's response to treatment, examination of patient, obtaining history from patient or surrogate, ordering and performing treatments and interventions, ordering and review of laboratory studies, ordering and review of radiographic studies, pulse oximetry and re-evaluation of patient's condition.  Final Clinical Impression(s) / ED  Diagnoses Final diagnoses:  None    Rx / DC Orders ED Discharge Orders    None       Lorre Nick, MD 01/03/21 2026

## 2021-01-03 NOTE — Progress Notes (Signed)
A consult was received from an ED physician for Vancomycin, Cefepime per pharmacy dosing.  The patient's profile has been reviewed for ht/wt/allergies/indication/available labs.   A one time order has been placed for Vancomycin 1g IV, Cefepime 2g IV.  Further antibiotics/pharmacy consults should be ordered by admitting physician if indicated.                       Thank you, Lynann Beaver PharmD, BCPS Clinical Pharmacist [facility] main pharmacy [phone] 01/03/2021 8:01 PM

## 2021-01-03 NOTE — H&P (Signed)
History and Physical    Kendra Smith VZC:[id number] DOB: May 04, 1926 DOA: 01/03/2021  PCP: Chilton Greathouse, MD   Patient coming from: SNF   Chief Complaint: Decreased responsiveness, abnormal labs   HPI: Kendra Smith is a 87 y.o. female with medical history significant for paroxysmal atrial fibrillation, seizures, chronic kidney disease 3 a, and dementia, now presenting to the emergency department for evaluation of decreased responsiveness and abnormal blood work.  Per report of SNF personnel, the patient has had decreased responsiveness and blood work was obtained demonstrating elevated BUN and creatinine.  Patient is unable to provide history in the ED.  Her family notes that at baseline, she is sometimes talkative but is confused, feeds herself, but requires assistance with all other activities and has been requiring a wheelchair.  ED Course: Upon arrival to the ED, patient is found to have a rectal temperature of 37.9, saturating upper 90s on room air, normal respirations, normal blood pressure, and tachycardia in the low 100s.  EKG features ectopic atrial tachycardia with PVCs, LVH, and IVCD.  Chest x-ray demonstrates mild cardiomegaly and mild hypoinflation.  Noncontrast head CT is negative for acute intracranial abnormality.  Chemistry panel notable for bicarbonate of 15, BUN 116, and creatinine 2.31.  CBC demonstrates a leukocytosis to 21,900 and mild thrombocytosis.  Troponin is normal.  COVID-19 PCR is pending.  Lactic acid is pending.  Blood and urine cultures were ordered and the patient was given 2 L of LR, vancomycin, cefepime, and Flagyl in the ED.  Review of Systems:  Unable to complete ROS secondary the patient's clinical condition.  Past Medical History:  Diagnosis Date  . Depression   . Glaucoma   . Hyperlipidemia   . Hypertension   . Osteoporosis     Past Surgical History:  Procedure Laterality Date  . ABDOMINAL HYSTERECTOMY    . ANKLE ARTHROSCOPY    .  CHOLECYSTECTOMY    . EYE SURGERY    . RECTOCELE REPAIR      Social History:   reports that she has never smoked. She has never used smokeless tobacco. She reports that she does not drink alcohol and does not use drugs.  Allergies  Allergen Reactions  . Ciprofloxacin Hcl Other (See Comments)    On MAR  . Effexor [Venlafaxine] Other (See Comments)    On MAR  . Gantrisin [Sulfisoxazole] Other (See Comments)    On MAR  . Levaquin [Levofloxacin] Other (See Comments)    On MAR  . Sulfa Antibiotics Rash    History reviewed. No pertinent family history.   Prior to Admission medications   Medication Sig Start Date End Date Taking? Authorizing Provider  acetaminophen (TYLENOL) 500 MG tablet Take 1,000 mg by mouth every 6 (six) hours as needed for mild pain, fever or headache.     [provider]  calcitonin, salmon, (MIACALCIN/FORTICAL) 200 UNIT/ACT nasal spray Place 1 spray into alternate nostrils daily.    [provider]  calcium carbonate (OSCAL) 1500 (600 Ca) MG TABS tablet Take 600 mg of elemental calcium by mouth daily with breakfast.    [provider]  cephALEXin (KEFLEX) 250 MG capsule Take 1 capsule (250 mg total) by mouth 3 (three) times daily. Take until 06/02/20 Patient not taking: Reported on 07/30/2020 06/01/20   Maurilio Lovely D, DO  cholecalciferol (VITAMIN D3) 25 MCG (1000 UT) tablet Take 1,000 Units by mouth daily with breakfast.     [provider]  digoxin (LANOXIN) 0.125 MG tablet Take 0.125  mg by mouth every other day.     [provider]  donepezil (ARICEPT) 5 MG tablet Take 5 mg by mouth at bedtime. 10/15/18   [provider]  DULoxetine (CYMBALTA) 60 MG capsule Take 60 mg by mouth daily with breakfast.  05/22/14   [provider]  feeding supplement, ENSURE ENLIVE, (ENSURE ENLIVE) LIQD Take 237 mLs by mouth 2 (two) times daily between meals. Patient not taking: Reported on 05/28/2020 09/01/19   Arrien,  York Ram, MD  HYDROcodone-acetaminophen (NORCO/VICODIN) 5-325 MG tablet Take 1 tablet by mouth See admin instructions. Take 1 tablet by mouth two times a day (9 AM and 9 PM) and every 12 hours as needed for pain 06/01/20   Sherryll Burger, Pratik D, DO  HYDROcodone-acetaminophen (NORCO/VICODIN) 5-325 MG tablet Take 1-2 tablets by mouth every 6 (six) hours as needed. 07/30/20   Wynetta Fines, MD  Infant Care Products [facility] EX) Apply 1 application topically See admin instructions. Dermacloud ointment- Apply to buttocks after each incontinent episode    [provider]  latanoprost (XALATAN) 0.005 % ophthalmic solution Place 1 drop into both eyes at bedtime.    [provider]  levETIRAcetam (KEPPRA) 500 MG tablet Take 1 tablet (500 mg total) by mouth 2 (two) times daily. 06/01/20 07/30/20  Sherryll Burger, Pratik D, DO  NON FORMULARY Take 120 mLs by mouth See admin instructions. MedPass- Drink 120 ml's by mouth two times a day    [provider]  nystatin (NYSTATIN) powder Apply 1 application topically 2 (two) times daily as needed (rash).    [provider]  pantoprazole (PROTONIX) 40 MG tablet Take 1 tablet (40 mg total) by mouth daily. 09/02/19 07/30/20  Arrien, York Ram, MD  pregabalin (LYRICA) 50 MG capsule Take 50 mg by mouth 2 (two) times daily.    [provider]  timolol (TIMOPTIC) 0.5 % ophthalmic solution Place 1 drop into both eyes daily. 06/15/14   [provider]  traMADol (ULTRAM) 50 MG tablet Take 1 tablet (50 mg total) by mouth every 8 (eight) hours as needed for severe pain. 06/01/20   Sherryll Burger, Pratik D, DO  traZODone (DESYREL) 50 MG tablet Take 25 mg by mouth every evening.  10/15/18   [provider]    Physical Exam: Vitals:   01/03/21 1856 01/03/21 1900 01/03/21 1930 01/03/21 2030  BP:  124/74 134/82 113/74  Pulse:  (!) 107 (!) 104 (!) 101  Resp:  19 17 19   Temp: 100.2 F (37.9 C)     TempSrc: Rectal     SpO2:  98% 98%  98%  Weight:      Height:        Constitutional: NAD, calm  Eyes: PERTLA, lids and conjunctivae normal ENMT: Mucous membranes are moist. Posterior pharynx clear of any exudate or lesions.   Neck: normal, supple, no masses, no thyromegaly Respiratory: no wheezing, no crackles. No accessory muscle use.  Cardiovascular: Rate ~100 and irregular. No extremity edema.  Abdomen: No distension, no tenderness, soft. Bowel sounds active.  Musculoskeletal: no clubbing / cyanosis. No joint deformity upper and lower extremities.   Skin: no significant rashes, lesions, ulcers. Warm, poor turgor. Neurologic: CN 2-12 grossly intact. Sensation intact. Moving all extremities.  Psychiatric: Awake, not answering basic questions appropriately. Calm.     Labs and Imaging on Admission: I have personally reviewed following labs and imaging studies  CBC: Recent Labs  Lab 01/03/21 1858  WBC 21.9*  NEUTROABS 17.8*  HGB 11.0*  HCT 36.8  MCV 83.6  PLT 412*   Basic Metabolic Panel: Recent Labs  Lab 01/03/21 1858  NA 137  K 3.6  CL 107  CO2 15*  GLUCOSE 137*  BUN 116*  CREATININE 2.31*  CALCIUM 9.6   GFR: Estimated Creatinine Clearance: 12.9 mL/min (A) (by C-G formula based on SCr of 2.31 mg/dL (H)). Liver Function Tests: Recent Labs  Lab 01/03/21 1858  AST 13*  ALT 11  ALKPHOS 82  BILITOT 0.6  PROT 8.5*  ALBUMIN 3.5   No results for input(s): LIPASE, AMYLASE in the last 168 hours. No results for input(s): AMMONIA in the last 168 hours. Coagulation Profile: No results for input(s): INR, PROTIME in the last 168 hours. Cardiac Enzymes: No results for input(s): CKTOTAL, CKMB, CKMBINDEX, TROPONINI in the last 168 hours. BNP (last 3 results) No results for input(s): PROBNP in the last 8760 hours. HbA1C: No results for input(s): HGBA1C in the last 72 hours. CBG: No results for input(s): GLUCAP in the last 168 hours. Lipid Profile: No results for input(s): CHOL, HDL, LDLCALC, TRIG,  CHOLHDL, LDLDIRECT in the last 72 hours. Thyroid Function Tests: No results for input(s): TSH, T4TOTAL, FREET4, T3FREE, THYROIDAB in the last 72 hours. Anemia Panel: No results for input(s): VITAMINB12, FOLATE, FERRITIN, TIBC, IRON, RETICCTPCT in the last 72 hours. Urine analysis:    Component Value Date/Time   COLORURINE YELLOW 01/03/2021 1851   APPEARANCEUR HAZY (A) 01/03/2021 1851   LABSPEC 1.020 01/03/2021 1851   PHURINE 5.0 01/03/2021 1851   GLUCOSEU NEGATIVE 01/03/2021 1851   HGBUR NEGATIVE 01/03/2021 1851   BILIRUBINUR NEGATIVE 01/03/2021 1851   KETONESUR NEGATIVE 01/03/2021 1851   PROTEINUR 30 (A) 01/03/2021 1851   UROBILINOGEN 1.0 06/22/2011 1224   NITRITE NEGATIVE 01/03/2021 1851   LEUKOCYTESUR MODERATE (A) 01/03/2021 1851   Sepsis Labs: @LABRCNTIP (procalcitonin:4,lacticidven:4) )No results found for this or any previous visit (from the past 240 hour(s)).   Radiological Exams on Admission: CT Head Wo Contrast  Result Date: 01/03/2021 CLINICAL DATA:  Altered level of consciousness, elevated BUN EXAM: CT HEAD WITHOUT CONTRAST TECHNIQUE: Contiguous axial images were obtained from the base of the skull through the vertex without intravenous contrast. COMPARISON:  07/30/2020 FINDINGS: Brain: Stable hypodensities in the periventricular white matter consistent with chronic small vessel ischemic change. No acute infarct or hemorrhage. Lateral ventricles and midline structures are unremarkable. No acute extra-axial fluid collections. No mass effect. Vascular: No hyperdense vessel or unexpected calcification. Skull: Normal. Negative for fracture or focal lesion. Sinuses/Orbits: No acute finding. Other: None. IMPRESSION: 1. Stable chronic small-vessel ischemic changes throughout the white matter. No acute intracranial process. Electronically Signed   By: Sharlet SalinaMichael  Brown M.D.   On: 01/03/2021 19:58   DG Chest Port 1 View  Result Date: 01/03/2021 CLINICAL DATA:  cough EXAM: PORTABLE CHEST  1 VIEW COMPARISON:  07/30/2020 and prior. FINDINGS: Mild hypoinflation. No pneumothorax or pleural effusion. No focal consolidation. Mild cardiomegaly. Chronic left humeral deformity. IMPRESSION: Mild hypoinflation.  No focal consolidation. Mild cardiomegaly. Electronically Signed   By: Stana Buntinghikanele  Emekauwa M.D.   On: 01/03/2021 19:04    EKG: Independently reviewed. Ectopic atrial tachycardia, rate 107, PVCs, LVH, IVCD.   Assessment/Plan   1. Acute renal failure superimposed on CKD IIIa; metabolic acidosis   - Presents from SNF with decreased responsiveness and increased BUN and creatinine and is found to have BUN 116 and SCr 2.31, up from 11 and 1.04 in July  - She appears hypovolemic and was fluid-resuscitated in  ED with 2 liters LR  - Continue IVF hydration with isotonic bicarbonate, renally-dose medications, monitor   2. Sepsis secondary to UTI  - Presents from SNF due to decreased responsiveness and acute renal failure and is found to have tachycardia and leukocytosis with clear CXR, no respiratory sxs, benign abdominal exam, no meningismus or apparent cellulitis, and UA compatible with possible infection  - Cultures are being collected in ED, lactate is pending, and she was fluid-resuscitated and started on broad-spectrum antibiotics  - Follow-up lactate, follow cultures, continue antibiotics with Rocephin    3. Acute encephalopathy; dementia  - Patient has dementia and per family she is often talkative but confused on baseline, is able to feed herself but requires assistance with all other activities   - Per SNF personnel, she was less responsive pta; no acute findings on head CT, likely related to infection and/or uremia   4. Paroxysmal atrial fibrillation  - In and out of atrial fibrillation in ED  - CHADS-VASc at least 3 (age x2, gender)   - Follow-up pharmacy medication reconciliation but does appear to be anticoagulated   5. Seizures  - Continue Keppra     DVT prophylaxis: sq  heparin   Code Status: DNR  Level of Care: Level of care: Telemetry Family Communication: Daughter and son-in-law updated by phone  Disposition Plan:  Patient is from: SNF  Anticipated d/c is to: SNF  Anticipated d/c date is: 01/07/21 Patient currently: in renal failure, possibly septic  Consults called: None  Admission status: Inpatient     Briscoe Deutscher, MD [facility]  01/03/2021, 8:57 PM

## 2021-01-03 NOTE — ED Triage Notes (Signed)
Pt sent from [facility] for elevated BUN (108.6) drawn this am and AMS. SNF told EMS pt is normally more responsive, but had limited info on pt. Pt EKG was abnormal, EMS sent over and was told it was similar to previous EKG. Hx of A.fib. Pt is DNR

## 2021-01-04 DIAGNOSIS — N1831 Chronic kidney disease, stage 3a: Secondary | ICD-10-CM

## 2021-01-04 DIAGNOSIS — G9341 Metabolic encephalopathy: Secondary | ICD-10-CM | POA: Diagnosis not present

## 2021-01-04 DIAGNOSIS — R569 Unspecified convulsions: Secondary | ICD-10-CM

## 2021-01-04 DIAGNOSIS — E872 Acidosis: Secondary | ICD-10-CM | POA: Diagnosis not present

## 2021-01-04 DIAGNOSIS — I480 Paroxysmal atrial fibrillation: Secondary | ICD-10-CM

## 2021-01-04 DIAGNOSIS — N179 Acute kidney failure, unspecified: Secondary | ICD-10-CM

## 2021-01-04 DIAGNOSIS — L8990 Pressure ulcer of unspecified site, unspecified stage: Secondary | ICD-10-CM | POA: Insufficient documentation

## 2021-01-04 DIAGNOSIS — F0391 Unspecified dementia with behavioral disturbance: Secondary | ICD-10-CM

## 2021-01-04 LAB — CBC
HCT: 24.4 % — ABNORMAL LOW (ref 36.0–46.0)
Hemoglobin: 7.5 g/dL — ABNORMAL LOW (ref 12.0–15.0)
MCH: 25.7 pg — ABNORMAL LOW (ref 26.0–34.0)
MCHC: 30.7 g/dL (ref 30.0–36.0)
MCV: 83.6 fL (ref 80.0–100.0)
Platelets: 302 10*3/uL (ref 150–400)
RBC: 2.92 MIL/uL — ABNORMAL LOW (ref 3.87–5.11)
RDW: 15.8 % — ABNORMAL HIGH (ref 11.5–15.5)
WBC: 16.3 10*3/uL — ABNORMAL HIGH (ref 4.0–10.5)
nRBC: 0 % (ref 0.0–0.2)

## 2021-01-04 LAB — MAGNESIUM: Magnesium: 1.6 mg/dL — ABNORMAL LOW (ref 1.7–2.4)

## 2021-01-04 LAB — MRSA PCR SCREENING: MRSA by PCR: NEGATIVE

## 2021-01-04 LAB — HEPATIC FUNCTION PANEL
ALT: 9 U/L (ref 0–44)
AST: 12 U/L — ABNORMAL LOW (ref 15–41)
Albumin: 3.3 g/dL — ABNORMAL LOW (ref 3.5–5.0)
Alkaline Phosphatase: 56 U/L (ref 38–126)
Bilirubin, Direct: 0.1 mg/dL (ref 0.0–0.2)
Indirect Bilirubin: 0.6 mg/dL (ref 0.3–0.9)
Total Bilirubin: 0.7 mg/dL (ref 0.3–1.2)
Total Protein: 6.4 g/dL — ABNORMAL LOW (ref 6.5–8.1)

## 2021-01-04 LAB — BASIC METABOLIC PANEL
Anion gap: 13 (ref 5–15)
BUN: 92 mg/dL — ABNORMAL HIGH (ref 8–23)
CO2: 20 mmol/L — ABNORMAL LOW (ref 22–32)
Calcium: 8.7 mg/dL — ABNORMAL LOW (ref 8.9–10.3)
Chloride: 105 mmol/L (ref 98–111)
Creatinine, Ser: 1.66 mg/dL — ABNORMAL HIGH (ref 0.44–1.00)
GFR, Estimated: 29 mL/min — ABNORMAL LOW (ref >60)
Glucose, Bld: 104 mg/dL — ABNORMAL HIGH (ref 70–99)
Potassium: 3 mmol/L — ABNORMAL LOW (ref 3.5–5.1)
Sodium: 138 mmol/L (ref 135–145)

## 2021-01-04 LAB — PHOSPHORUS: Phosphorus: 3 mg/dL (ref 2.5–4.6)

## 2021-01-04 MED ORDER — DIGOXIN 125 MCG PO TABS
0.1250 mg | ORAL_TABLET | ORAL | Status: DC
Start: 2021-01-05 — End: 2021-01-07
  Administered 2021-01-05 – 2021-01-07 (×3): 0.125 mg via ORAL
  Filled 2021-01-04 (×3): qty 1

## 2021-01-04 MED ORDER — SODIUM CHLORIDE 0.9 % IV BOLUS
1000.0000 mL | Freq: Once | INTRAVENOUS | Status: AC
  Administered 2021-01-04: 1000 mL via INTRAVENOUS

## 2021-01-04 MED ORDER — PHENOL 1.4 % MT LIQD: 1.0000 | OROMUCOSAL | Status: DC | PRN

## 2021-01-04 MED ORDER — DULOXETINE HCL 60 MG PO CPEP
60.0000 mg | ORAL_CAPSULE | Freq: Every day | ORAL | Status: DC
  Administered 2021-01-05 – 2021-01-07 (×3): 60 mg via ORAL
  Filled 2021-01-04: qty 2
  Filled 2021-01-04: qty 1
  Filled 2021-01-04: qty 2

## 2021-01-04 MED ORDER — LATANOPROST 0.005 % OP SOLN
1.0000 [drp] | Freq: Every day | OPHTHALMIC | Status: DC
  Administered 2021-01-04 – 2021-01-06 (×3): 1 [drp] via OPHTHALMIC
  Filled 2021-01-04: qty 2.5

## 2021-01-04 MED ORDER — STERILE WATER FOR INJECTION IV SOLN
INTRAVENOUS | Status: AC
  Filled 2021-01-04: qty 850

## 2021-01-04 MED ORDER — VITAMIN D 25 MCG (1000 UNIT) PO TABS
1000.0000 [IU] | ORAL_TABLET | Freq: Every day | ORAL | Status: DC
  Administered 2021-01-05 – 2021-01-07 (×3): 1000 [IU] via ORAL
  Filled 2021-01-04 (×3): qty 1

## 2021-01-04 MED ORDER — DONEPEZIL HCL 5 MG PO TABS
5.0000 mg | ORAL_TABLET | Freq: Every day | ORAL | Status: DC
  Administered 2021-01-04 – 2021-01-06 (×3): 5 mg via ORAL
  Filled 2021-01-04 (×3): qty 1

## 2021-01-04 MED ORDER — MAGNESIUM SULFATE 2 GM/50ML IV SOLN
2.0000 g | Freq: Once | INTRAVENOUS | Status: AC
  Administered 2021-01-04: 2 g via INTRAVENOUS
  Filled 2021-01-04: qty 50

## 2021-01-04 MED ORDER — TIMOLOL MALEATE 0.5 % OP SOLN
1.0000 [drp] | Freq: Every day | OPHTHALMIC | Status: DC
  Administered 2021-01-04 – 2021-01-07 (×4): 1 [drp] via OPHTHALMIC
  Filled 2021-01-04: qty 5

## 2021-01-04 MED ORDER — ORAL CARE MOUTH RINSE
15.0000 mL | Freq: Two times a day (BID) | OROMUCOSAL | Status: DC
  Administered 2021-01-04 – 2021-01-06 (×6): 15 mL via OROMUCOSAL

## 2021-01-04 MED ORDER — SODIUM CHLORIDE 0.9 % IV SOLN
INTRAVENOUS | Status: DC | PRN
  Administered 2021-01-04: 250 mL via INTRAVENOUS

## 2021-01-04 MED ORDER — POTASSIUM CHLORIDE CRYS ER 20 MEQ PO TBCR
40.0000 meq | EXTENDED_RELEASE_TABLET | Freq: Two times a day (BID) | ORAL | Status: AC
  Administered 2021-01-04 (×2): 40 meq via ORAL
  Filled 2021-01-04 (×2): qty 2

## 2021-01-04 MED ORDER — CHLORHEXIDINE GLUCONATE CLOTH 2 % EX PADS
6.0000 | MEDICATED_PAD | Freq: Every day | CUTANEOUS | Status: DC
  Administered 2021-01-04 – 2021-01-06 (×3): 6 via TOPICAL

## 2021-01-04 MED ORDER — PANTOPRAZOLE SODIUM 40 MG PO TBEC
40.0000 mg | DELAYED_RELEASE_TABLET | Freq: Every day | ORAL | Status: DC
  Administered 2021-01-04: 40 mg via ORAL
  Filled 2021-01-04: qty 1

## 2021-01-04 NOTE — Progress Notes (Addendum)
PROGRESS NOTE    Kendra Smith  NAT:[id number] DOB: 1926/09/02 DOA: 01/03/2021 PCP: Chilton Greathouse, MD   Brief Narrative:  HPI per Dr. Odie Sera on 01/03/21 Kendra Smith is a 87 y.o. female with medical history significant for paroxysmal atrial fibrillation, seizures, chronic kidney disease 3 a, and dementia, now presenting to the emergency department for evaluation of decreased responsiveness and abnormal blood work.  Per report of SNF personnel, the patient has had decreased responsiveness and blood work was obtained demonstrating elevated BUN and creatinine.  Patient is unable to provide history in the ED.  Her family notes that at baseline, she is sometimes talkative but is confused, feeds herself, but requires assistance with all other activities and has been requiring a wheelchair.  ED Course: Upon arrival to the ED, patient is found to have a rectal temperature of 37.9, saturating upper 90s on room air, normal respirations, normal blood pressure, and tachycardia in the low 100s.  EKG features ectopic atrial tachycardia with PVCs, LVH, and IVCD.  Chest x-ray demonstrates mild cardiomegaly and mild hypoinflation.  Noncontrast head CT is negative for acute intracranial abnormality.  Chemistry panel notable for bicarbonate of 15, BUN 116, and creatinine 2.31.  CBC demonstrates a leukocytosis to 21,900 and mild thrombocytosis.  Troponin is normal.  COVID-19 PCR is pending.  Lactic acid is pending.  Blood and urine cultures were ordered and the patient was given 2 L of LR, vancomycin, cefepime, and Flagyl in the ED.  **Interim History Patient has been intermittently hypotensive and so was given another 1 L bolus.  She still remains significantly confused and is only alert and oriented x1 but renal function is trending downwards along with the rest of her labs.  Assessment & Plan:   Principal Problem:   Acute renal failure superimposed on stage 3a chronic kidney disease (HCC) Active  Problems:   Dementia (HCC)   PAF (paroxysmal atrial fibrillation) (HCC)   Seizure (HCC)   Severe sepsis (HCC)   Metabolic acidosis   Acute metabolic encephalopathy   Pressure injury of skin  Acute renal failure superimposed on CKD IIIa; Metabolic Acidosis   - Presents from SNF with decreased responsiveness and increased BUN and creatinine and is found to have BUN 116 and SCr 2.31, up from 11 and 1.04 in July  - She appears hypovolemic and was fluid-resuscitated in ED with 2 liters LR  and still appears a little dry in the colon was hypotensive this morning with low maps so was given another 1 L normal saline bolus and she continued on the sodium bicarb drip with 150 mEq of sodium bicarb and at 100 MLS per hour and will reduce the rate to 75 MLS per hour -Continue IVF hydration with isotonic bicarbonate as above -BUN/creatinine is improved as it has gone from 116/2.31 is now 92/1.66 -Her acidosis is improving as well as her CO2 was 15 and is now 20, anion gap is now 13, chloride level 105 -Avoid nephrotoxic medications, contrast dyes, hypotension and renally dose medications  -Continue monitor and trend renal function carefully and repeat CMP in a.m.  Sepsis secondary to UTI  - Presents from SNF due to decreased responsiveness and acute renal failure and is found to have tachycardia and leukocytosis with clear CXR, no respiratory sxs, benign abdominal exam, no meningismus or apparent cellulitis, and UA compatible with possible infection  -Urinalysis showed a hazy appearance with moderate leukocytes, many bacteria, 0-5 RBCs per high-power field, 11-20 WBCs with urine culture pending as  well as blood cultures x2 pending - Cultures are being collected in ED, lactate is pending, and she was fluid-resuscitated and started on broad-spectrum antibiotics  - Follow-up lactate was 1.2, follow cultures,  -Continue Empriic Antibiotics with IV Ceftriaxone    Hypotension -In the setting of above -Given  additional 1 L bolus and continue monitor blood pressures per protocol -Currently getting maintenance IV fluid but will do 3 to 75 mL  Acute Encephalopathy Superimposed on Chronic Dementia  - Patient has dementia and per family she is often talkative but confused on baseline, is able to feed herself but requires assistance with all other activities   -Currently is only alert and oriented x1 - Per SNF personnel, she was less responsive pta; no acute findings on head CT, likely related to infection and/or uremia  -This seems to be improving now -We will place on delirium precautions -Continue with donepezil 5 mg p.o. nightly and duloxetine 60 mg p.o. with breakfast -Continue with IV fluid resuscitation and antibiotics as above  Paroxysmal atrial fibrillation  - In and out of atrial fibrillation in ED  - CHADS-VASc at least 60 (age x2, gender)   -We will resume her digoxin 0.125 mg p.o. every other day and check a digoxin level - Follow-up pharmacy medication reconciliation but does not appear to be anticoagulated   Acquired Thrombophilia -As above is not anticoagulated  Glaucoma -Continue with Timolol and Latanoprost  GERD -Continue with PPI pantoprazole 40 mill daily  Seizures  -Continue with levetiracetam 500 mg p.o.   Hypokalemia -Mild. K+ was 3.0 -Replete with po KCl 40 mEQ BID x2 -Continue to Monitor and Replete as Necessary -Repeat CMP in the AM   Normocytic Anemia -[name] was hemoconcentrated on admission had a dilutional drop as hemoglobin/hematocrit went from 11.0/36.8 is now 7.5/24.4 -Check FOBT and also obtain anemia panel in the a.m. -Continue to monitor for signs and symptoms of bleeding; currently no overt bleeding noted -Repeat CBC in a.m.  GOC: DNR, poA  DVT prophylaxis: Heparin 5,000 units sq q8h Code Status: DO NOT RESUSCITATE Family Communication: No family present at bedside  Disposition Plan: Pending further clinical improvement back to baseline  and improvement in Renal Fxn  Status is: Inpatient  Remains inpatient appropriate because:Unsafe d/c plan, IV treatments appropriate due to intensity of illness or inability to take PO and Inpatient level of care appropriate due to severity of illness   Dispo: The patient is from: SNF              Anticipated d/c is to: SNF              Patient currently is not medically stable to d/c.   Difficult to place patient No  Consultants:   None   Procedures: None  Antimicrobials:  Anti-infectives (From admission, onward)   Start     Dose/Rate Route Frequency Ordered Stop   01/04/21 1000  cefTRIAXone (ROCEPHIN) 1 g in sodium chloride 0.9 % 100 mL IVPB        1 g 200 mL/hr over 30 Minutes Intravenous Every 24 hours 01/03/21 2057     01/03/21 2000  ceFEPIme (MAXIPIME) 2 g in sodium chloride 0.9 % 100 mL IVPB        2 g 200 mL/hr over 30 Minutes Intravenous  Once 01/03/21 1952 01/03/21 2052   01/03/21 2000  metroNIDAZOLE (FLAGYL) IVPB 500 mg        500 mg 100 mL/hr over 60 Minutes Intravenous  Once 01/03/21  1952 01/03/21 2157   01/03/21 2000  vancomycin (VANCOCIN) IVPB 1000 mg/200 mL premix        1,000 mg 200 mL/hr over 60 Minutes Intravenous  Once 01/03/21 1952 01/03/21 2216        Subjective: Seen and examined at bedside and still remains significantly confused and denies any pain.  Nursing states that her mentation is a bit better now after getting fluids but she is little hypotensive.  Patient unable to provide a subjective history given her dementia.  No other concerns or complaints at this time.  Objective: Vitals:   01/04/21 0500 01/04/21 0600 01/04/21 0700 01/04/21 0725  BP: (!) 120/35 119/71 (!) 116/45   Pulse: (!) 101 (!) 102 88 94  Resp: 15 19 15  (!) 26  Temp: 97.6 F (36.4 C)     TempSrc: Axillary     SpO2: 100% 100% 99% 99%  Weight:      Height:        Intake/Output Summary (Last 24 hours) at 01/04/2021 0749 Last data filed at 01/04/2021 0725 Gross per 24  hour  Intake 959.69 ml  Output 100 ml  Net 859.69 ml   Filed Weights   01/03/21 1810 01/04/21 0244  Weight: 58 kg 45.8 kg   Examination: Physical Exam:  Constitutional: Patient is a thin female in no acute distress appears calm but is significantly confused and only alert and oriented x1 Eyes: Lids and conjunctivae normal, sclerae anicteric  ENMT: External Ears, Nose appear normal. Grossly normal hearing.  Neck: Appears normal, supple, no cervical masses, normal ROM, no appreciable thyromegaly; no JVD Respiratory: Diminished to auscultation bilaterally, no wheezing, rales, rhonchi or crackles. Normal respiratory effort and patient is not tachypenic. No accessory muscle use.  Cardiovascular: RRR, no murmurs / rubs / gallops. S1 and S2 auscultated.  No appreciable extremity edema noted Abdomen: Soft, non-tender, non-distended.  Bowel sounds positive.  GU: Deferred. Musculoskeletal: No clubbing / cyanosis of digits/nails. No joint deformity upper and lower extremities.  Skin: No rashes, lesions, ulcers on limited skin evaluation. No induration; Warm and dry.  Neurologic: CN 2-12 grossly intact with no focal deficits. Romberg sign and cerebellar reflexes not assessed.  Psychiatric: Normal judgment and insight. Alert and oriented x 1. Normal mood and appropriate affect.   Data Reviewed: I have personally reviewed following labs and imaging studies  CBC: Recent Labs  Lab 01/03/21 1858 01/04/21 0258  WBC 21.9* 16.3*  NEUTROABS 17.8*  --   HGB 11.0* 7.5*  HCT 36.8 24.4*  MCV 83.6 83.6  PLT 412* 302   Basic Metabolic Panel: Recent Labs  Lab 01/03/21 1858 01/04/21 0258  NA 137 138  K 3.6 3.0*  CL 107 105  CO2 15* 20*  GLUCOSE 137* 104*  BUN 116* 92*  CREATININE 2.31* 1.66*  CALCIUM 9.6 8.7*   GFR: Estimated Creatinine Clearance: 15.6 mL/min (A) (by C-G formula based on SCr of 1.66 mg/dL (H)). Liver Function Tests: Recent Labs  Lab 01/03/21 1858  AST 13*  ALT 11   ALKPHOS 82  BILITOT 0.6  PROT 8.5*  ALBUMIN 3.5   No results for input(s): LIPASE, AMYLASE in the last 168 hours. No results for input(s): AMMONIA in the last 168 hours. Coagulation Profile: No results for input(s): INR, PROTIME in the last 168 hours. Cardiac Enzymes: No results for input(s): CKTOTAL, CKMB, CKMBINDEX, TROPONINI in the last 168 hours. BNP (last 3 results) No results for input(s): PROBNP in the last 8760 hours. HbA1C: No results  for input(s): HGBA1C in the last 72 hours. CBG: No results for input(s): GLUCAP in the last 168 hours. Lipid Profile: No results for input(s): CHOL, HDL, LDLCALC, TRIG, CHOLHDL, LDLDIRECT in the last 72 hours. Thyroid Function Tests: No results for input(s): TSH, T4TOTAL, FREET4, T3FREE, THYROIDAB in the last 72 hours. Anemia Panel: No results for input(s): VITAMINB12, FOLATE, FERRITIN, TIBC, IRON, RETICCTPCT in the last 72 hours. Sepsis Labs: Recent Labs  Lab 01/03/21 2012  LATICACIDVEN 1.9    Recent Results (from the past 240 hour(s))  Resp Panel by RT-PCR (Flu A&B, Covid) Nasopharyngeal Swab     Status: None   Collection Time: 01/03/21  7:00 PM   Specimen: Nasopharyngeal Swab; Nasopharyngeal(NP) swabs in vial transport medium  Result Value Ref Range Status   SARS Coronavirus 2 by RT PCR NEGATIVE NEGATIVE Final    Comment: (NOTE) SARS-CoV-2 target nucleic acids are NOT DETECTED.  The SARS-CoV-2 RNA is generally detectable in upper respiratory specimens during the acute phase of infection. The lowest concentration of SARS-CoV-2 viral copies this assay can detect is 138 copies/mL. A negative result does not preclude SARS-Cov-2 infection and should not be used as the sole basis for treatment or other patient management decisions. A negative result may occur with  improper specimen collection/handling, submission of specimen other than nasopharyngeal swab, presence of viral mutation(s) within the areas targeted by this assay, and  inadequate number of viral copies(<138 copies/mL). A negative result must be combined with clinical observations, patient history, and epidemiological information. The expected result is Negative.  Fact Sheet for Patients:  BloggerCourse.comhttps://www.fda.gov/media/152166/download  Fact Sheet for Healthcare Providers:  SeriousBroker.ithttps://www.fda.gov/media/152162/download  This test is no t yet approved or cleared by the [place] FDA and  has been authorized for detection and/or diagnosis of SARS-CoV-2 by FDA under an Emergency Use Authorization (EUA). This EUA will remain  in effect (meaning this test can be used) for the duration of the COVID-19 declaration under Section 564(b)(1) of the Act, 21 U.S.C.section 360bbb-3(b)(1), unless the authorization is terminated  or revoked sooner.       Influenza A by PCR NEGATIVE NEGATIVE Final   Influenza B by PCR NEGATIVE NEGATIVE Final    Comment: (NOTE) The Xpert Xpress SARS-CoV-2/FLU/RSV plus assay is intended as an aid in the diagnosis of influenza from Nasopharyngeal swab specimens and should not be used as a sole basis for treatment. Nasal washings and aspirates are unacceptable for Xpert Xpress SARS-CoV-2/FLU/RSV testing.  Fact Sheet for Patients: BloggerCourse.comhttps://www.fda.gov/media/152166/download  Fact Sheet for Healthcare Providers: SeriousBroker.ithttps://www.fda.gov/media/152162/download  This test is not yet approved or cleared by the [place] FDA and has been authorized for detection and/or diagnosis of SARS-CoV-2 by FDA under an Emergency Use Authorization (EUA). This EUA will remain in effect (meaning this test can be used) for the duration of the COVID-19 declaration under Section 564(b)(1) of the Act, 21 U.S.C. section 360bbb-3(b)(1), unless the authorization is terminated or revoked.  Performed at [facility], [address] [place], [place] [id number]   MRSA PCR Screening     Status: None   Collection Time: 01/04/21  2:52 AM    Specimen: Nasopharyngeal  Result Value Ref Range Status   MRSA by PCR NEGATIVE NEGATIVE Final    Comment:        The GeneXpert MRSA Assay (FDA approved for NASAL specimens only), is one component of a comprehensive MRSA colonization surveillance program. It is not intended to diagnose MRSA infection nor to guide or monitor treatment for  MRSA infections. Performed at [facility], [address] [place], [place] [address]     RN Pressure Injury Documentation: Pressure Injury 01/04/21 Buttocks Right;Left Stage 2 -  Partial thickness loss of dermis presenting as a shallow open injury with a red, pink wound bed without slough. (Active)  01/04/21 0308  Location: Buttocks  Location Orientation: Right;Left  Staging: Stage 2 -  Partial thickness loss of dermis presenting as a shallow open injury with a red, pink wound bed without slough.  Wound Description (Comments):   Present on Admission: Yes    Estimated body mass index is 17.89 kg/m as calculated from the following:   Height as of this encounter: 5\' 3"  (1.6 m).   Weight as of this encounter: 45.8 kg.  Malnutrition Type:   Malnutrition Characteristics:    Nutrition Interventions:    Radiology Studies: CT Head Wo Contrast  Result Date: 01/03/2021 CLINICAL DATA:  Altered level of consciousness, elevated BUN EXAM: CT HEAD WITHOUT CONTRAST TECHNIQUE: Contiguous axial images were obtained from the base of the skull through the vertex without intravenous contrast. COMPARISON:  07/30/2020 FINDINGS: Brain: Stable hypodensities in the periventricular white matter consistent with chronic small vessel ischemic change. No acute infarct or hemorrhage. Lateral ventricles and midline structures are unremarkable. No acute extra-axial fluid collections. No mass effect. Vascular: No hyperdense vessel or unexpected calcification. Skull: Normal. Negative for fracture or focal lesion. Sinuses/Orbits: No acute finding. Other: None.  IMPRESSION: 1. Stable chronic small-vessel ischemic changes throughout the white matter. No acute intracranial process. Electronically Signed   By: 08/01/2020 M.D.   On: 01/03/2021 19:58   DG Chest Port 1 View  Result Date: 01/03/2021 CLINICAL DATA:  cough EXAM: PORTABLE CHEST 1 VIEW COMPARISON:  07/30/2020 and prior. FINDINGS: Mild hypoinflation. No pneumothorax or pleural effusion. No focal consolidation. Mild cardiomegaly. Chronic left humeral deformity. IMPRESSION: Mild hypoinflation.  No focal consolidation. Mild cardiomegaly. Electronically Signed   By: 08/01/2020 M.D.   On: 01/03/2021 19:04   Scheduled Meds: . Chlorhexidine Gluconate Cloth  6 each Topical Daily  . heparin  5,000 Units Subcutaneous Q8H  . levETIRAcetam  500 mg Oral BID  . mouth rinse  15 mL Mouth Rinse BID  . potassium chloride  40 mEq Oral BID  . sodium chloride flush  3 mL Intravenous Q12H   Continuous Infusions: . cefTRIAXone (ROCEPHIN)  IV    .  sodium bicarbonate (isotonic) infusion in sterile water 100 mL/hr at 01/04/21 0725    LOS: 1 day    01/06/21, DO [facility] PAGER is on AMION  If 7PM-7AM, please contact night-coverage [email or web address]

## 2021-01-04 NOTE — ED Notes (Signed)
Report called to [place], RN

## 2021-01-04 NOTE — TOC Initial Note (Signed)
Transition of Care [facility]) - Initial/Assessment Note    Patient Details  Name: Kendra Smith MRN: [id number] Date of Birth: Mar 08, 1926  Transition of [facility] CM/SW Contact:    Golda Acre, RN Phone Number: 01/04/2021, 8:12 AM  Clinical Narrative:                 87 y.o. female with medical history significant for paroxysmal atrial fibrillation, seizures, chronic kidney disease 3 a, and dementia, now presenting to the emergency department for evaluation of decreased responsiveness and abnormal blood work.  Per report of SNF personnel, the patient has had decreased responsiveness and blood work was obtained demonstrating elevated BUN and creatinine.  Patient is unable to provide history in the ED.  Her family notes that at baseline, she is sometimes talkative but is confused, feeds herself, but requires assistance with all other activities and has been requiring a wheelchair.  ED Course: Upon arrival to the ED, patient is found to have a rectal temperature of 37.9, saturating upper 90s on room air, normal respirations, normal blood pressure, and tachycardia in the low 100s.  EKG features ectopic atrial tachycardia with PVCs, LVH, and IVCD.  Chest x-ray demonstrates mild cardiomegaly and mild hypoinflation.  Noncontrast head CT is negative for acute intracranial abnormality.  Chemistry panel notable for bicarbonate of 15, BUN 116, and creatinine 2.31.  CBC demonstrates a leukocytosis to 21,900 and mild thrombocytosis.  Troponin is normal.  COVID-19 PCR is pending.  Lactic acid is pending.  Blood and urine cultures were ordered and the patient was given 2 L of LR, vancomycin, cefepime, and Flagyl in the ED. PLAN: to return asap to [facility] for continued care.post hospital stay Expected Discharge Plan: Skilled Nursing Facility Barriers to Discharge: Continued Medical Work up   Patient Goals and CMS Choice Patient states their goals for this hospitalization and ongoing recovery are::  UNABLE TO STATE IS FROM [facility]      Expected Discharge Plan and Services Expected Discharge Plan: Skilled Nursing Facility   Discharge Planning Services: CM Consult   Living arrangements for the past 2 months: Skilled Nursing Facility                                      Prior Living Arrangements/Services Living arrangements for the past 2 months: Skilled Nursing Facility Lives with:: Facility Resident Patient language and need for interpreter reviewed:: Yes        Need for Family Participation in Patient Care: Yes (Comment) Care giver support system in place?: Yes (comment)   Criminal Activity/Legal Involvement Pertinent to Current Situation/Hospitalization: No - Comment as needed  Activities of Daily Living Home Assistive Devices/Equipment: Other (Comment) (pt unable to verbalize) ADL Screening (condition at time of admission) Patient's cognitive ability adequate to safely complete daily activities?: No Is the patient deaf or have difficulty hearing?:  (unknown, pt unable to answer) Does the patient have difficulty seeing, even when wearing glasses/contacts?:  (unknown, pt unable to answer) Does the patient have difficulty concentrating, remembering, or making decisions?: Yes Patient able to express need for assistance with ADLs?: No Does the patient have difficulty dressing or bathing?: Yes Independently performs ADLs?: No Communication: Needs assistance Is this a change from baseline?: Pre-admission baseline Dressing (OT): Needs assistance Is this a change from baseline?: Pre-admission baseline Grooming: Needs assistance Is this a change from baseline?: Pre-admission baseline Feeding: Needs assistance Is this a  change from baseline?: Pre-admission baseline Bathing: Needs assistance Is this a change from baseline?: Pre-admission baseline Toileting: Needs assistance Is this a change from baseline?: Pre-admission baseline In/Out Bed: Needs  assistance Is this a change from baseline?: Pre-admission baseline Walks in Home: Needs assistance Is this a change from baseline?: Pre-admission baseline Does the patient have difficulty walking or climbing stairs?: Yes Weakness of Legs: Both Weakness of Arms/Hands: Both  Permission Sought/Granted                  Emotional Assessment Appearance:: Appears stated age Attitude/Demeanor/Rapport: Unable to Assess Affect (typically observed): Unable to Assess Orientation: : Fluctuating Orientation (Suspected and/or reported Sundowners) Alcohol / Substance Use: Not Applicable Psych Involvement: No (comment)  Admission diagnosis:  Altered mental status, unspecified altered mental status type [R41.82] Acute renal failure superimposed on stage 3a chronic kidney disease (HCC) [N17.9, N18.31] Patient Active Problem List   Diagnosis Date Noted  . Pressure injury of skin 01/04/2021  . Acute renal failure superimposed on stage 3a chronic kidney disease (HCC) 01/03/2021  . Severe sepsis (HCC) 01/03/2021  . Metabolic acidosis 01/03/2021  . Acute metabolic encephalopathy 01/03/2021  . Seizure (HCC) 05/28/2020  . Weakness generalized   . GI bleed 08/30/2019  . UTI (urinary tract infection) 08/30/2019  . Hypokalemia 08/30/2019  . Demand ischemia (HCC) 08/30/2019  . Palliative care by specialist   . COVID-19   . DNR (do not resuscitate)   . Adult failure to thrive   . Left acetabular fracture (HCC) 08/23/2019  . Pelvic rami fracture (HCC) 08/23/2019  . PAF (paroxysmal atrial fibrillation) (HCC) 08/23/2019  . Rib fracture-12 01/12/2019  . Fracture of transverse process of lumbar vertebra L1-L2 01/12/2019  . Dementia (HCC) 01/12/2019  . Fall 01/12/2019  . Leukocytosis 01/12/2019  . Depression   . Hypertension    PCP:  Chilton Greathouse, MD Pharmacy:   [facility] - [place], [place] - [phone] [place] [address] Phone: [phone]  Fax: [phone]     Social Determinants of Health (SDOH) Interventions    Readmission Risk Interventions No flowsheet data found.

## 2021-01-05 DIAGNOSIS — E872 Acidosis: Secondary | ICD-10-CM | POA: Diagnosis not present

## 2021-01-05 DIAGNOSIS — G9341 Metabolic encephalopathy: Secondary | ICD-10-CM | POA: Diagnosis not present

## 2021-01-05 DIAGNOSIS — F0391 Unspecified dementia with behavioral disturbance: Secondary | ICD-10-CM | POA: Diagnosis not present

## 2021-01-05 DIAGNOSIS — N179 Acute kidney failure, unspecified: Secondary | ICD-10-CM | POA: Diagnosis not present

## 2021-01-05 DIAGNOSIS — R195 Other fecal abnormalities: Secondary | ICD-10-CM

## 2021-01-05 LAB — COMPREHENSIVE METABOLIC PANEL
ALT: 8 U/L (ref 0–44)
AST: 14 U/L — ABNORMAL LOW (ref 15–41)
Albumin: 2.7 g/dL — ABNORMAL LOW (ref 3.5–5.0)
Alkaline Phosphatase: 54 U/L (ref 38–126)
Anion gap: 9 (ref 5–15)
BUN: 42 mg/dL — ABNORMAL HIGH (ref 8–23)
CO2: 29 mmol/L (ref 22–32)
Calcium: 8.2 mg/dL — ABNORMAL LOW (ref 8.9–10.3)
Chloride: 98 mmol/L (ref 98–111)
Creatinine, Ser: 0.77 mg/dL (ref 0.44–1.00)
GFR, Estimated: >60 mL/min (ref >60)
Glucose, Bld: 86 mg/dL (ref 70–99)
Potassium: 3.3 mmol/L — ABNORMAL LOW (ref 3.5–5.1)
Sodium: 136 mmol/L (ref 135–145)
Total Bilirubin: 0.5 mg/dL (ref 0.3–1.2)
Total Protein: 5.8 g/dL — ABNORMAL LOW (ref 6.5–8.1)

## 2021-01-05 LAB — URINE CULTURE

## 2021-01-05 LAB — PHOSPHORUS: Phosphorus: 1.1 mg/dL — ABNORMAL LOW (ref 2.5–4.6)

## 2021-01-05 LAB — CBC WITH DIFFERENTIAL/PLATELET
Abs Immature Granulocytes: 0.09 10*3/uL — ABNORMAL HIGH (ref 0.00–0.07)
Basophils Absolute: 0 10*3/uL (ref 0.0–0.1)
Basophils Relative: 0 %
Eosinophils Absolute: 0.2 10*3/uL (ref 0.0–0.5)
Eosinophils Relative: 2 %
HCT: 25.4 % — ABNORMAL LOW (ref 36.0–46.0)
Hemoglobin: 7.7 g/dL — ABNORMAL LOW (ref 12.0–15.0)
Immature Granulocytes: 1 %
Lymphocytes Relative: 15 %
Lymphs Abs: 1.8 10*3/uL (ref 0.7–4.0)
MCH: 25.2 pg — ABNORMAL LOW (ref 26.0–34.0)
MCHC: 30.3 g/dL (ref 30.0–36.0)
MCV: 83.3 fL (ref 80.0–100.0)
Monocytes Absolute: 0.9 10*3/uL (ref 0.1–1.0)
Monocytes Relative: 8 %
Neutro Abs: 8.9 10*3/uL — ABNORMAL HIGH (ref 1.7–7.7)
Neutrophils Relative %: 74 %
Platelets: 267 10*3/uL (ref 150–400)
RBC: 3.05 MIL/uL — ABNORMAL LOW (ref 3.87–5.11)
RDW: 15.8 % — ABNORMAL HIGH (ref 11.5–15.5)
WBC: 12 10*3/uL — ABNORMAL HIGH (ref 4.0–10.5)
nRBC: 0 % (ref 0.0–0.2)

## 2021-01-05 LAB — OCCULT BLOOD X 1 CARD TO LAB, STOOL: Fecal Occult Bld: POSITIVE — AB

## 2021-01-05 LAB — MAGNESIUM: Magnesium: 1.9 mg/dL (ref 1.7–2.4)

## 2021-01-05 MED ORDER — POTASSIUM CHLORIDE CRYS ER 20 MEQ PO TBCR
40.0000 meq | EXTENDED_RELEASE_TABLET | Freq: Once | ORAL | Status: AC
  Administered 2021-01-05: 40 meq via ORAL
  Filled 2021-01-05: qty 2

## 2021-01-05 MED ORDER — PANTOPRAZOLE SODIUM 40 MG IV SOLR
40.0000 mg | Freq: Two times a day (BID) | INTRAVENOUS | Status: DC
  Administered 2021-01-05 – 2021-01-07 (×5): 40 mg via INTRAVENOUS
  Filled 2021-01-05 (×5): qty 40

## 2021-01-05 MED ORDER — POTASSIUM PHOSPHATES 15 MMOLE/5ML IV SOLN
20.0000 mmol | Freq: Once | INTRAVENOUS | Status: AC
  Administered 2021-01-05: 20 mmol via INTRAVENOUS
  Filled 2021-01-05: qty 6.67

## 2021-01-05 MED ORDER — K PHOS MONO-SOD PHOS DI & MONO 155-852-130 MG PO TABS
500.0000 mg | ORAL_TABLET | Freq: Once | ORAL | Status: AC
  Administered 2021-01-05: 500 mg via ORAL
  Filled 2021-01-05: qty 2

## 2021-01-05 NOTE — Evaluation (Signed)
Physical Therapy Evaluation Patient Details Name: Kendra Smith MRN: [id number] DOB: 01/10/1926 Today's Date: 01/05/2021   History of Present Illness  Kendra Smith is a 87 y.o. female is a resident at [facility] nursing facility, she was transferred to the ER with decreased responsiveness and abnormal blood work.  In the ED she was found to be slightly tachycardic, with renal impairment, leukocytosis and was started on IV antibiotics and IV fluids. Being treatedf or sepsis secondary to UTI and Acute renal failure superimposed on CKD IIIa.  Clinical Impression   Pt very pleasant , happy and cooperative with therapy. Pt likely close to her baseline with tranfers with staff at [facility]. Will maintain on case load to assure pt doesn't loose strength and transfer ability to be able to return to [facility] long term care, will work on sitting balance, stand pivots, and stand stands, few steps  for hygiene and transfer safety.     Follow Up Recommendations Other (comment) (will continue with pt in acute to decrease decline , however pt close to her long term care baseline to return back to [facility])    Equipment Recommendations       Recommendations for Other Services       Precautions / Restrictions Precautions Precautions: Fall Precaution Comments: Painful LUE and impaired ROM due to recentL proximal humeral fracture non operative in Sept 2021 Restrictions Weight Bearing Restrictions: No      Mobility  Bed Mobility Overal bed mobility: Needs Assistance Bed Mobility: Supine to Sit;Sit to Supine     Supine to sit: Mod assist;+2 for physical assistance;+2 for safety/equipment;Supervision Sit to supine: Total assist;+2 for physical assistance;+2 for safety/equipment;HOB elevated        Transfers Overall transfer level: Needs assistance Equipment used: 2 person hand held assist Transfers: Sit to/from Stand Sit to Stand: Mod assist;+2 physical assistance;+2  safety/equipment         General transfer comment: Took two steps to the left. Difficulty advancing LLE, however able to take these 2 stesp at EOB fair well.  Ambulation/Gait                Stairs            Wheelchair Mobility    Modified Rankin (Stroke Patients Only)       Balance Overall balance assessment: History of Falls                                           Pertinent Vitals/Pain Pain Assessment: Faces Faces Pain Scale: Hurts little more Pain Location: LUE Pain Descriptors / Indicators: Grimacing Pain Intervention(s): Limited activity within patient's tolerance    Home Living Family/patient expects to be discharged to:: Skilled nursing facility (from [employer])                 Additional Comments: Pt is at [facility]    Prior Function Level of Independence: Needs assistance   Gait / Transfers Assistance Needed: Ot Called [facility] - patient is a pivot to the chair. Had a rapid decline after humeral fracture.  ADL's / Homemaking Assistance Needed: needs assistance with ADLs. Can feed herself.        Hand Dominance   Dominant Hand: Right    Extremity/Trunk Assessment   Upper Extremity Assessment Upper Extremity Assessment: RUE deficits/detail;LUE deficits/detail;Generalized weakness RUE Deficits / Details: WFL ROM LUE Deficits / Details: Approx 30  degress of flexion tolerated, painful, grossly functional elbow, wrist and hand ROM.    Lower Extremity Assessment Lower Extremity Assessment: Generalized weakness (however all ROM and AROM within normal limits for B LEs)    Cervical / Trunk Assessment Cervical / Trunk Assessment: Kyphotic (difficulty hold head upright in sitting and standing positions)  Communication   Communication: No difficulties  Cognition Arousal/Alertness: Awake/alert Behavior During Therapy: WFL for tasks assessed/performed Overall Cognitive Status: Within Functional  Limits for tasks assessed                                 General Comments: Hx of dementia. Initially knew she was at [facility] but then asked where she was at end of session. Knows she lives at a facility and that she is 16. Can follow all commands. Increased time to answer questions.      General Comments      Exercises     Assessment/Plan    PT Assessment Patient needs continued PT services  PT Problem List Decreased strength;Decreased activity tolerance;Decreased mobility       PT Treatment Interventions Functional mobility training;Therapeutic activities;Patient/family education;Therapeutic exercise    PT Goals (Current goals can be found in the Care Plan section)  Acute Rehab PT Goals Patient Stated Goal: maiantain strength while here to be able to reutn at baseline with trasnfers with staff at [facility] PT Goal Formulation: With patient Time For Goal Achievement: 01/18/21 Potential to Achieve Goals: Fair    Frequency Min 2X/week   Barriers to discharge        Co-evaluation PT/OT/SLP Co-Evaluation/Treatment: Yes Reason for Co-Treatment: Complexity of the patient's impairments (multi-system involvement);For patient/therapist safety PT goals addressed during session: Mobility/safety with mobility OT goals addressed during session: ADL's and self-care       AM-PAC PT "6 Clicks" Mobility  Outcome Measure Help needed turning from your back to your side while in a flat bed without using bedrails?: A Little Help needed moving from lying on your back to sitting on the side of a flat bed without using bedrails?: A Lot Help needed moving to and from a bed to a chair (including a wheelchair)?: A Lot Help needed standing up from a chair using your arms (e.g., wheelchair or bedside chair)?: A Lot Help needed to walk in hospital room?: A Lot Help needed climbing 3-5 steps with a railing? : A Lot 6 Click Score: 13    End of Session   Activity Tolerance:  Patient tolerated treatment well Patient left: in bed (due to having dirrhea durign and before session) Nurse Communication: Mobility status PT Visit Diagnosis: Muscle weakness (generalized) (M62.81)    Time: 4259-5638 PT Time Calculation (min) (ACUTE ONLY): 17 min   Charges:   PT Evaluation $PT Eval Low Complexity: 1 Low PT Treatments $Therapeutic Activity: 8-22 mins        Sharron, PT, MPT Acute Rehabilitation Services Office: [phone] Pager: [phone] 01/05/2021   Marella Bile 01/05/2021, 5:43 PM

## 2021-01-05 NOTE — Progress Notes (Addendum)
PROGRESS NOTE    Kendra Smith  ZOX:[id number] DOB: 09/10/1926 DOA: 01/03/2021 PCP: Chilton GreathouseAvva, Ravisankar, MD   Brief Narrative:  HPI per Dr. Odie Seraimothy Opyd on 01/03/21 Kendra Smith is a 87 y.o. female with medical history significant for paroxysmal atrial fibrillation, seizures, chronic kidney disease 3 a, and dementia, now presenting to the emergency department for evaluation of decreased responsiveness and abnormal blood work.  Per report of SNF personnel, the patient has had decreased responsiveness and blood work was obtained demonstrating elevated BUN and creatinine.  Patient is unable to provide history in the ED.  Her family notes that at baseline, she is sometimes talkative but is confused, feeds herself, but requires assistance with all other activities and has been requiring a wheelchair.  ED Course: Upon arrival to the ED, patient is found to have a rectal temperature of 37.9, saturating upper 90s on room air, normal respirations, normal blood pressure, and tachycardia in the low 100s.  EKG features ectopic atrial tachycardia with PVCs, LVH, and IVCD.  Chest x-ray demonstrates mild cardiomegaly and mild hypoinflation.  Noncontrast head CT is negative for acute intracranial abnormality.  Chemistry panel notable for bicarbonate of 15, BUN 116, and creatinine 2.31.  CBC demonstrates a leukocytosis to 21,900 and mild thrombocytosis.  Troponin is normal.  COVID-19 PCR is pending.  Lactic acid is pending.  Blood and urine cultures were ordered and the patient was given 2 L of LR, vancomycin, cefepime, and Flagyl in the ED.  **Interim History Patient has been intermittently hypotensive and so was given another 2 L bolus.  She still remains significantly confused and is only alert and oriented x1 but renal function is trending downwards along with the rest of her labs but her Blood Count dropped and initially thought to be dilutional but she is FOBT Positive so will change PPI to BID and consult  with Gastroenterology for further evaluation   Assessment & Plan:   Principal Problem:   Acute renal failure superimposed on stage 3a chronic kidney disease (HCC) Active Problems:   Dementia (HCC)   PAF (paroxysmal atrial fibrillation) (HCC)   Seizure (HCC)   Severe sepsis (HCC)   Metabolic acidosis   Acute metabolic encephalopathy   Pressure injury of skin  Acute renal failure superimposed on CKD IIIa; improving  Metabolic Acidosis   - Presents from SNF with decreased responsiveness and increased BUN and creatinine and is found to have BUN 116 and SCr 2.31, up from 11 and 1.04 in July  - She appears hypovolemic and was fluid-resuscitated in ED with 2 liters LR  and still appears a little dry in the colon was hypotensive this morning with low maps so was given another 2 L normal saline bolus and she continued on the sodium bicarb drip with 150 mEq of sodium bicarb and at 100 MLS per hour and will reduce the rate to 75 MLS per hour and now Mainteance IVF now stopped -Discontinued IVF hydration with isotonic bicarbonate as above -BUN/creatinine is improved as it has gone from 116/2.31 -> 92/1.66 -> 42/0.77 -Her acidosis is improving as well as her CO2 was 15 and is now 29, anion gap is now 9, chloride level 98 -Avoid nephrotoxic medications, contrast dyes, hypotension and renally dose medications  -Continue monitor and trend renal function carefully and repeat CMP in a.m.  Sepsis secondary to UTI  - Presents from SNF due to decreased responsiveness and acute renal failure and is found to have tachycardia and leukocytosis with clear CXR, no  respiratory sxs, benign abdominal exam, no meningismus or apparent cellulitis, and UA compatible with possible infection  -Urinalysis showed a hazy appearance with moderate leukocytes, many bacteria, 0-5 RBCs per high-power field, 11-20 WBCs with urine culture showed Multiple Species Present as well as blood cultures x2 still pending  WBC trending down  from 21.9 -> 16.2 -> 12.0 - Cultures are being collected in ED, lactate is pending, and she was fluid-resuscitated and started on broad-spectrum antibiotics  - Follow-up lactate was 1.2, follow cultures,  -Continue Empriic Antibiotics with IV Ceftriaxone for now despite Urine Cx stating Multiple Species Present   Hypotension, ? From Blood Loss -In the setting of above -Given additional 2 L bolus (total yeseterday)and continue monitor blood pressures per protocol -Was getting maintenance IV fluid but was reduced to 75 mL yesterday and now stopped  -Continue to Monitor BP per Protocol  -Last BP still remains soft and is 116/55  Acute Encephalopathy Superimposed on Chronic Dementia  - Patient has dementia and per family she is often talkative but confused on baseline, is able to feed herself but requires assistance with all other activities   -Currently is only alert and oriented x1 still and thinks the year is 2020 and that she is at home  - Per SNF personnel, she was less responsive pta; no acute findings on head CT, likely related to infection and/or uremia  -This seems to be improving now -We will place on delirium precautions -Continue with donepezil 5 mg p.o. nightly and duloxetine 60 mg p.o. with breakfast -IV fluid resuscitation and antibiotics as above  Paroxysmal atrial fibrillation  - In and out of atrial fibrillation in ED; In Atrial Fib this AM with HR in the Mid 100's - Teens  - CHADS-VASc at least 47 (age x2, gender)   -We will resume her digoxin 0.125 mg p.o. every other day and check a digoxin level - Follow-up pharmacy medication reconciliation but does not appear to be anticoagulated   Acquired Thrombophilia -As above is not anticoagulated  Glaucoma -Continue with Timolol and Latanoprost  GERD -Change  PPI Pantoprazole 40 mg daily to IV 40 mg po BID  Seizures  -Continue with levetiracetam 500 mg p.o.   Hypokalemia -Mild. K+ was 3.3 -Replete with po KCl 40  mEQ x1 and IV K Phos 20 mmol  -Continue to Monitor and Replete as Necessary -Repeat CMP in the AM   Leukocytosis -Could have been from significant dehydration but also in the setting of Infection -Patient's WBC went from 21.9 -> 16.2 -> 12.0 -C/w Empiric Abx As above -Continue to Monitor for S/Sx of Infection -Repeat CBC in the AM   Hypophosphatemia -Patient's Phos Level was 1.1 -Replete with IV K Phos 20 mmol -Continue to Monitor and Replete as Necessary -Repeat Phos Level in the AM   Normocytic Anemia with Concern for Blood Loss -Likely was hemoconcentrated on admission had a dilutional drop as hemoglobin/hematocrit went from 11.0/36.8 -> 7.5/24.4 -> 7.7/25.4 (The patient's BUN remains elevated at 42) -Check FOBT and was POSITIVE; Will Discontinue Heparin 5,000 units sq and start PPI as above -Obtain anemia panel this AM;  -Will consult Gastroenterology for further evaluation  -Continue to monitor for signs and symptoms of bleeding; currently no overt bleeding noted -Repeat CBC in a.m.  GOC: DNR, poA  DVT prophylaxis: Heparin 5,000 units sq q8h now held Code Status: DO NOT RESUSCITATE Family Communication: No family present at bedside but spoke to [employer] over the phone Disposition Plan: Pending  further clinical improvement back to baseline and improvement in Renal Fxn  Status is: Inpatient  Remains inpatient appropriate because:Unsafe d/c plan, IV treatments appropriate due to intensity of illness or inability to take PO and Inpatient level of care appropriate due to severity of illness   Dispo: The patient is from: SNF              Anticipated d/c is to: SNF              Patient currently is not medically stable to d/c.   Difficult to place patient No  Consultants:   Gastroenterology    Procedures: None  Antimicrobials:  Anti-infectives (From admission, onward)   Start     Dose/Rate Route Frequency Ordered Stop   01/04/21 1000  cefTRIAXone (ROCEPHIN) 1 g in  sodium chloride 0.9 % 100 mL IVPB        1 g 200 mL/hr over 30 Minutes Intravenous Every 24 hours 01/03/21 2057     01/03/21 2000  ceFEPIme (MAXIPIME) 2 g in sodium chloride 0.9 % 100 mL IVPB        2 g 200 mL/hr over 30 Minutes Intravenous  Once 01/03/21 1952 01/03/21 2052   01/03/21 2000  metroNIDAZOLE (FLAGYL) IVPB 500 mg        500 mg 100 mL/hr over 60 Minutes Intravenous  Once 01/03/21 1952 01/03/21 2157   01/03/21 2000  vancomycin (VANCOCIN) IVPB 1000 mg/200 mL premix        1,000 mg 200 mL/hr over 60 Minutes Intravenous  Once 01/03/21 1952 01/03/21 2216        Subjective: Seen and examined at bedside and was resting in bed and is awake and alert and oriented x1 still.  Thinks she is at home and when asked about what month we are she states "2020".  She denies any pain but grimaced 1 lower abdomen was palpated.  Denies any shortness of breath or chest pain.  Labs continue to improve but she is FOBT positive and nursing states that she has not had a bowel movement on their shift.  No other concerns or complaints at this time.  Objective: Vitals:   01/05/21 0500 01/05/21 0600 01/05/21 0700 01/05/21 0800  BP: (!) 133/34 (!) 134/55 (!) 154/43 (!) 141/50  Pulse: 92 (!) 101 (!) 104 (!) 106  Resp: 16 20 19 18   Temp:    98.4 F (36.9 C)  TempSrc:    Oral  SpO2: 96% 100% 96% 96%  Weight:      Height:        Intake/Output Summary (Last 24 hours) at 01/05/2021 01/07/2021 Last data filed at 01/05/2021 0800 Gross per 24 hour  Intake 2989.56 ml  Output 1100 ml  Net 1889.56 ml   Filed Weights   01/03/21 1810 01/04/21 0244  Weight: 58 kg 45.8 kg   Examination Physical Exam:  Constitutional: The patient is a thin Caucasian female currently no acute distress appears calm but remains confused and is only alert and oriented x1.  Appears baseline Eyes: Lids and conjunctivae normal, sclerae anicteric  ENMT: External Ears, Nose appear normal. Grossly normal hearing.  Neck: Appears normal,  supple, no cervical masses, normal ROM, no appreciable thyromegaly: No JVD Respiratory: Diminished to auscultation bilaterally, no wheezing, rales, rhonchi or crackles. Normal respiratory effort and patient is not tachypenic. No accessory muscle use.  Unlabored breathing Cardiovascular: Irregularly irregular slightly tachycardic, no murmurs / rubs / gallops. S1 and S2 auscultated.  Minimal edema noted  Abdomen: Soft, tender to palpate in the lower abdomen, non-distended. Bowel sounds positive.  GU: Deferred. Musculoskeletal: No clubbing / cyanosis of digits/nails. No joint deformity upper and lower extremities.  Skin: No rashes, lesions, ulcers on limited skin evaluation. No induration; Warm and dry.  Neurologic: CN 2-12 grossly intact with no focal deficits. Romberg sign cerebellar reflexes not assessed.  Psychiatric: Slightly impaired judgment and insight. Alert and oriented x 1. Normal mood and appropriate affect.   Data Reviewed: I have personally reviewed following labs and imaging studies  CBC: Recent Labs  Lab 01/03/21 1858 01/04/21 0258 01/05/21 0215  WBC 21.9* 16.3* 12.0*  NEUTROABS 17.8*  --  8.9*  HGB 11.0* 7.5* 7.7*  HCT 36.8 24.4* 25.4*  MCV 83.6 83.6 83.3  PLT 412* 302 267   Basic Metabolic Panel: Recent Labs  Lab 01/03/21 1858 01/04/21 0258 01/04/21 0306 01/05/21 0215  NA 137 138  --  136  K 3.6 3.0*  --  3.3*  CL 107 105  --  98  CO2 15* 20*  --  29  GLUCOSE 137* 104*  --  86  BUN 116* 92*  --  42*  CREATININE 2.31* 1.66*  --  0.77  CALCIUM 9.6 8.7*  --  8.2*  MG  --   --  1.6* 1.9  PHOS  --   --  3.0 1.1*   GFR: Estimated Creatinine Clearance: 32.4 mL/min (by C-G formula based on SCr of 0.77 mg/dL). Liver Function Tests: Recent Labs  Lab 01/03/21 1858 01/04/21 0306 01/05/21 0215  AST 13* 12* 14*  ALT ALKPHOS 82 56 54  BILITOT 0.6 0.7 0.5  PROT 8.5* 6.4* 5.8*  ALBUMIN 3.5 3.3* 2.7*   No results for input(s): LIPASE, AMYLASE in the last  168 hours. No results for input(s): AMMONIA in the last 168 hours. Coagulation Profile: No results for input(s): INR, PROTIME in the last 168 hours. Cardiac Enzymes: No results for input(s): CKTOTAL, CKMB, CKMBINDEX, TROPONINI in the last 168 hours. BNP (last 3 results) No results for input(s): PROBNP in the last 8760 hours. HbA1C: No results for input(s): HGBA1C in the last 72 hours. CBG: No results for input(s): GLUCAP in the last 168 hours. Lipid Profile: No results for input(s): CHOL, HDL, LDLCALC, TRIG, CHOLHDL, LDLDIRECT in the last 72 hours. Thyroid Function Tests: No results for input(s): TSH, T4TOTAL, FREET4, T3FREE, THYROIDAB in the last 72 hours. Anemia Panel: No results for input(s): VITAMINB12, FOLATE, FERRITIN, TIBC, IRON, RETICCTPCT in the last 72 hours. Sepsis Labs: Recent Labs  Lab 01/03/21 2012  LATICACIDVEN 1.9    Recent Results (from the past 240 hour(s))  Urine Culture     Status: Abnormal   Collection Time: 01/03/21  6:51 PM   Specimen: Urine, Clean Catch  Result Value Ref Range Status   Specimen Description   Final    URINE, CLEAN CATCH Performed at [facility], [address], [place], [place] [address]    Special Requests   Final    NONE Performed at [facility], [address], [place], [place] [address]    Culture MULTIPLE SPECIES PRESENT, SUGGEST RECOLLECTION (A)  Final   Report Status 01/05/2021 FINAL  Final  Resp Panel by RT-PCR (Flu A&B, Covid) Nasopharyngeal Swab     Status: None   Collection Time: 01/03/21  7:00 PM   Specimen: Nasopharyngeal Swab; Nasopharyngeal(NP) swabs in vial transport medium  Result Value Ref Range Status   SARS  Coronavirus 2 by RT PCR NEGATIVE NEGATIVE Final    Comment: (NOTE) SARS-CoV-2 target nucleic acids are NOT DETECTED.  The SARS-CoV-2 RNA is generally detectable in upper respiratory specimens during the acute phase of infection. The lowest concentration of  SARS-CoV-2 viral copies this assay can detect is 138 copies/mL. A negative result does not preclude SARS-Cov-2 infection and should not be used as the sole basis for treatment or other patient management decisions. A negative result may occur with  improper specimen collection/handling, submission of specimen other than nasopharyngeal swab, presence of viral mutation(s) within the areas targeted by this assay, and inadequate number of viral copies(<138 copies/mL). A negative result must be combined with clinical observations, patient history, and epidemiological information. The expected result is Negative.  Fact Sheet for Patients:  BloggerCourse.com  Fact Sheet for Healthcare Providers:  SeriousBroker.it  This test is no t yet approved or cleared by the [place] FDA and  has been authorized for detection and/or diagnosis of SARS-CoV-2 by FDA under an Emergency Use Authorization (EUA). This EUA will remain  in effect (meaning this test can be used) for the duration of the COVID-19 declaration under Section 564(b)(1) of the Act, 21 U.S.C.section 360bbb-3(b)(1), unless the authorization is terminated  or revoked sooner.       Influenza A by PCR NEGATIVE NEGATIVE Final   Influenza B by PCR NEGATIVE NEGATIVE Final    Comment: (NOTE) The Xpert Xpress SARS-CoV-2/FLU/RSV plus assay is intended as an aid in the diagnosis of influenza from Nasopharyngeal swab specimens and should not be used as a sole basis for treatment. Nasal washings and aspirates are unacceptable for Xpert Xpress SARS-CoV-2/FLU/RSV testing.  Fact Sheet for Patients: BloggerCourse.com  Fact Sheet for Healthcare Providers: SeriousBroker.it  This test is not yet approved or cleared by the [place] FDA and has been authorized for detection and/or diagnosis of SARS-CoV-2 by FDA under an Emergency Use  Authorization (EUA). This EUA will remain in effect (meaning this test can be used) for the duration of the COVID-19 declaration under Section 564(b)(1) of the Act, 21 U.S.C. section 360bbb-3(b)(1), unless the authorization is terminated or revoked.  Performed at [facility], [address] [place], [place] [address]   MRSA PCR Screening     Status: None   Collection Time: 01/04/21  2:52 AM   Specimen: Nasopharyngeal  Result Value Ref Range Status   MRSA by PCR NEGATIVE NEGATIVE Final    Comment:        The GeneXpert MRSA Assay (FDA approved for NASAL specimens only), is one component of a comprehensive MRSA colonization surveillance program. It is not intended to diagnose MRSA infection nor to guide or monitor treatment for MRSA infections. Performed at [facility], [address] [place], [place] [address]     RN Pressure Injury Documentation: Pressure Injury 01/04/21 Buttocks Right;Left Stage 2 -  Partial thickness loss of dermis presenting as a shallow open injury with a red, pink wound bed without slough. (Active)  01/04/21 0308  Location: Buttocks  Location Orientation: Right;Left  Staging: Stage 2 -  Partial thickness loss of dermis presenting as a shallow open injury with a red, pink wound bed without slough.  Wound Description (Comments):   Present on Admission: Yes    Estimated body mass index is 17.89 kg/m as calculated from the following:   Height as of this encounter:  (1.6 m).   Weight as of this encounter: 45.8 kg.  Malnutrition Type:  Malnutrition Characteristics:    Nutrition Interventions:    Radiology Studies: CT Head Wo Contrast  Result Date: 01/03/2021 CLINICAL DATA:  Altered level of consciousness, elevated BUN EXAM: CT HEAD WITHOUT CONTRAST TECHNIQUE: Contiguous axial images were obtained from the base of the skull through the vertex without intravenous contrast. COMPARISON:  07/30/2020 FINDINGS:  Brain: Stable hypodensities in the periventricular white matter consistent with chronic small vessel ischemic change. No acute infarct or hemorrhage. Lateral ventricles and midline structures are unremarkable. No acute extra-axial fluid collections. No mass effect. Vascular: No hyperdense vessel or unexpected calcification. Skull: Normal. Negative for fracture or focal lesion. Sinuses/Orbits: No acute finding. Other: None. IMPRESSION: 1. Stable chronic small-vessel ischemic changes throughout the white matter. No acute intracranial process. Electronically Signed   By: Sharlet Salina M.D.   On: 01/03/2021 19:58   DG Chest Port 1 View  Result Date: 01/03/2021 CLINICAL DATA:  cough EXAM: PORTABLE CHEST 1 VIEW COMPARISON:  07/30/2020 and prior. FINDINGS: Mild hypoinflation. No pneumothorax or pleural effusion. No focal consolidation. Mild cardiomegaly. Chronic left humeral deformity. IMPRESSION: Mild hypoinflation.  No focal consolidation. Mild cardiomegaly. Electronically Signed   By: Stana Bunting M.D.   On: 01/03/2021 19:04   Scheduled Meds: . Chlorhexidine Gluconate Cloth  6 each Topical Daily  . cholecalciferol  1,000 Units Oral Q breakfast  . digoxin  0.125 mg Oral QODAY  . donepezil  5 mg Oral QHS  . DULoxetine  60 mg Oral Q breakfast  . latanoprost  1 drop Both Eyes QHS  . levETIRAcetam  500 mg Oral BID  . mouth rinse  15 mL Mouth Rinse BID  . pantoprazole (PROTONIX) IV  40 mg Intravenous Q12H  . potassium chloride  40 mEq Oral Once  . sodium chloride flush  3 mL Intravenous Q12H  . timolol  1 drop Both Eyes Daily   Continuous Infusions: . sodium chloride Stopped (01/04/21 1435)  . cefTRIAXone (ROCEPHIN)  IV Stopped (01/04/21 1029)  . potassium PHOSPHATE IVPB (in mmol)      LOS: 2 days    Merlene Laughter, DO [facility] PAGER is on AMION  If 7PM-7AM, please contact night-coverage [email or web address]

## 2021-01-05 NOTE — Consult Note (Signed)
[facility] Consult  Referring Provider: Dr.Sheikh/[facility] Primary Care Physician:  Chilton Greathouse, MD Primary Gastroenterologist: Lynelle Doctor Dr.Mann many years ago as per daughter)  Reason for Consultation: Drop in hemoglobin, FOBT positive stool  HPI: Kendra Smith is a 87 y.o. female is a resident at [facility] nursing facility, she was transferred to the ER with decreased responsiveness and abnormal blood work. In the ED she was found to be slightly tachycardic, with renal impairment, leukocytosis and was started on IV antibiotics and IV fluids. On admission her hemoglobin was 11 which has decreased to 7.5/7.7 subsequently and stool for occult blood tested positive.  Patient is confused, has history of dementia and is not able to provide much history. I spoke with the daughter Kendra Smith, over the phone, [phone] who tells me that patient is a DNR.  She does not want her mother to undergo any endoscopic intervention or anesthesia.  She is also not willing for her to have blood transfusions. In September when she had a proximal humeral fracture, her daughter states that they decided conservative management.  Patient states that she had a colonoscopy with Dr. Loreta Ave, perhaps 10 years ago. Has not had any GI work-up recently.   Past Medical History:  Diagnosis Date  . Depression   . Glaucoma   . Hyperlipidemia   . Hypertension   . Osteoporosis     Past Surgical History:  Procedure Laterality Date  . ABDOMINAL HYSTERECTOMY    . ANKLE ARTHROSCOPY    . CHOLECYSTECTOMY    . EYE SURGERY    . RECTOCELE REPAIR      Prior to Admission medications   Medication Sig Start Date End Date Taking? Authorizing Provider  calcitonin, salmon, (MIACALCIN/FORTICAL) 200 UNIT/ACT nasal spray Place 1 spray into alternate nostrils daily.   Yes [provider]  calcium carbonate (OSCAL) 1500 (600 Ca) MG TABS tablet Take 600 mg of elemental calcium by mouth  daily with breakfast.   Yes [provider]  cholecalciferol (VITAMIN D3) 25 MCG (1000 UT) tablet Take 1,000 Units by mouth daily with breakfast.    Yes [provider]  DESITIN 40 % PSTE Apply 1 application topically in the morning and at bedtime. 11/05/20  Yes [provider]  digoxin (LANOXIN) 0.125 MG tablet Take 0.125 mg by mouth every other day.    Yes [provider]  donepezil (ARICEPT) 5 MG tablet Take 5 mg by mouth at bedtime. 10/15/18  Yes [provider]  DULoxetine (CYMBALTA) 30 MG capsule Take 30 mg by mouth daily. 12/11/20  Yes [provider]  HYDROcodone-acetaminophen (NORCO/VICODIN) 5-325 MG tablet Take 1-2 tablets by mouth every 6 (six) hours as needed. Patient taking differently: Take 1 tablet by mouth in the morning and at bedtime. 07/30/20  Yes Wynetta Fines, MD  [employer] [facility] EX) Apply 1 application topically See admin instructions. Dermacloud ointment- Apply to buttocks after each incontinent episode   Yes [provider]  [employer] [facility]) OINT Apply 1 application topically in the morning and at bedtime. 10/09/20  Yes [provider]  lactose free nutrition (BOOST) LIQD Take 237 mLs by mouth 2 (two) times daily between meals.   Yes [provider]  latanoprost (XALATAN) 0.005 % ophthalmic solution Place 1 drop into both eyes at bedtime.   Yes [provider]  levETIRAcetam (KEPPRA) 500 MG tablet Take 500 mg by mouth 2 (two) times daily.   Yes [provider]  NON FORMULARY Take  120 mLs by mouth See admin instructions. MedPass- Drink 120 ml's by mouth two times a day   Yes [provider]  pantoprazole (PROTONIX) 40 MG tablet Take 40 mg by mouth daily.   Yes [provider]  pregabalin (LYRICA) 50 MG capsule Take 50 mg by mouth 2 (two) times daily.   Yes [provider]  timolol (TIMOPTIC) 0.5 % ophthalmic solution  Place 1 drop into both eyes daily. 06/15/14  Yes [provider]  traZODone (DESYREL) 50 MG tablet Take 25 mg by mouth every evening.  10/15/18  Yes [provider]  acetaminophen (TYLENOL) 500 MG tablet Take 1,000 mg by mouth every 6 (six) hours as needed for mild pain, fever or headache.     [provider]  HYDROcodone-acetaminophen (NORCO/VICODIN) 5-325 MG tablet Take 1 tablet by mouth See admin instructions. Take 1 tablet by mouth two times a day (9 AM and 9 PM) and every 12 hours as needed for pain Patient not taking: Reported on 01/04/2021 06/01/20   Sherryll BurgerShah, Pratik D, DO  traMADol (ULTRAM) 50 MG tablet Take 1 tablet (50 mg total) by mouth every 8 (eight) hours as needed for severe pain. 06/01/20   Maurilio LovelyShah, Pratik D, DO    Current Facility-Administered Medications  Medication Dose Route Frequency Provider Last Rate Last Admin  . 0.9 %  sodium chloride infusion   Intravenous PRN Marguerita MerlesSheikh, Omair [place] Beach[place]if, DO   Stopped at 01/04/21 1435  . acetaminophen (TYLENOL) tablet 650 mg  650 mg Oral Q6H PRN Opyd, Lavone Neriimothy S, MD       Or  . acetaminophen (TYLENOL) suppository 650 mg  650 mg Rectal Q6H PRN Opyd, Lavone Neriimothy S, MD      . cefTRIAXone (ROCEPHIN) 1 g in sodium chloride 0.9 % 100 mL IVPB  1 g Intravenous Q24H Opyd, Lavone Neriimothy S, MD 200 mL/hr at 01/05/21 0930 1 g at 01/05/21 0930  . Chlorhexidine Gluconate Cloth 2 % PADS 6 each  6 each Topical Daily Opyd, Lavone Neriimothy S, MD   6 each at 01/04/21 0900  . cholecalciferol (VITAMIN D3) tablet 1,000 Units  1,000 Units Oral Q breakfast Marguerita MerlesSheikh, Omair [place], [place]   1,000 Units at 01/05/21 [id number]  . digoxin (LANOXIN) tablet 0.125 mg  0.125 mg Oral QODAY Sheikh, Omair [place], DO   0.125 mg at 01/05/21 [id number]  . donepezil (ARICEPT) tablet 5 mg  5 mg Oral QHS Sheikh, Kateri McOmair [place]if, DO   5 mg at 01/04/21 2057  . DULoxetine (CYMBALTA) DR capsule 60 mg  60 mg Oral Q breakfast Marguerita MerlesSheikh, Omair [place], DO   60 mg at 01/05/21 [id number]  . latanoprost (XALATAN) 0.005 % ophthalmic  solution 1 drop  1 drop Both Eyes QHS Marguerita MerlesSheikh, Omair [place]if, DO   1 drop at 01/04/21 2045  . levETIRAcetam (KEPPRA) tablet 500 mg  500 mg Oral BID Briscoe Deutscherpyd, Timothy S, MD   500 mg at 01/05/21 [id number]  . MEDLINE mouth rinse  15 mL Mouth Rinse BID Opyd, Lavone Neriimothy S, MD   15 mL at 01/05/21 0919  . ondansetron (ZOFRAN) tablet 4 mg  4 mg Oral Q6H PRN Opyd, Lavone Neriimothy S, MD       Or  . ondansetron (ZOFRAN) injection 4 mg  4 mg Intravenous Q6H PRN Opyd, Lavone Neriimothy S, MD      . pantoprazole (PROTONIX) injection 40 mg  40 mg Intravenous [address]Q12H Sheikh, Omair [place], DO   40 mg at 01/05/21 [id number]  . phenol (CHLORASEPTIC) mouth spray 1 spray  1 spray Mouth/Throat PRN Marguerita Merles Latif, DO      . potassium PHOSPHATE 20 mmol in dextrose 5 % 500 mL infusion  20 mmol Intravenous Once Sheikh, Omair Latif, DO      . senna-docusate (Senokot-S) tablet 1 tablet  1 tablet Oral QHS PRN Opyd, Lavone Neri, MD      . sodium chloride flush (NS) 0.9 % injection 3 mL  3 mL Intravenous Q12H Opyd, Lavone Neri, MD   3 mL at 01/05/21 0926  . timolol (TIMOPTIC) 0.5 % ophthalmic solution 1 drop  1 drop Both Eyes Daily Marguerita Merles Nikolai, [place]   1 drop at 01/05/21 6440    Allergies as of 01/03/2021 - Review Complete 01/03/2021  Allergen Reaction Noted  . Ciprofloxacin hcl Other (See Comments) 05/11/2019  . Effexor [venlafaxine] Other (See Comments) 05/11/2019  . Gantrisin [sulfisoxazole] Other (See Comments) 05/11/2019  . Levaquin [levofloxacin] Other (See Comments) 05/11/2019  . Sulfa antibiotics Rash 06/28/2014    History reviewed. No pertinent family history.  Social History   Socioeconomic History  . Marital status: Widowed    Spouse name: Not on file  . Number of children: Not on file  . Years of education: Not on file  . Highest education level: Not on file  Occupational History  . Not on file  Tobacco Use  . Smoking status: Never Smoker  . Smokeless tobacco: Never Used  Vaping Use  . Vaping Use: Never used  Substance and Sexual  Activity  . Alcohol use: No  . Drug use: No  . Sexual activity: Not on file  Other Topics Concern  . Not on file  Social History Narrative  . Not on file   Social Determinants of Health   Financial Resource Strain: Not on file  Food Insecurity: Not on file  Transportation Needs: Not on file  Physical Activity: Not on file  Stress: Not on file  Social Connections: Not on file  Intimate Partner Violence: Not on file    Review of Systems: Unable to obtain  Physical Exam: Vital signs in last 24 hours: Temp:  [97.5 F (36.4 C)-98.4 F (36.9 C)] 98.4 F (36.9 C) (02/26 0800) Pulse Rate:  [78-106] 104 (02/26 0900) Resp:  [9-21] 21 (02/26 0900) BP: (87-157)/(22-55) 116/55 (02/26 0900) SpO2:  [87 %-100 %] 100 % (02/26 0900) Last BM Date: 01/04/21  General:   Elderly, pleasantly confused, frail Head:  Normocephalic and atraumatic. Eyes:  Sclera clear, no icterus.   Mild pallor Ears:  Normal auditory acuity. Nose:  No deformity, discharge,  or lesions. Mouth:  No deformity or lesions.  Oropharynx pink & moist. Neck:  Supple; no masses or thyromegaly. Lungs:  Clear throughout to auscultation.   No wheezes, crackles, or rhonchi. No acute distress. Heart: Slightly tachycardic Extremities:  Without clubbing or edema. Neurologic: Awake, but not oriented to time/person or place, follows some commands Skin:  Intact without significant lesions or rashes. Psych:  Alert and cooperative. Normal mood and affect. Abdomen:  Soft, nontender and nondistended. No masses, hepatosplenomegaly or hernias noted. Normal bowel sounds, without guarding, and without rebound.  Patient noted to have fecal accident, stool was light yellow in color        Lab Results: Recent Labs    01/03/21 1858 01/04/21 0258 01/05/21 0215  WBC 21.9* 16.3* 12.0*  HGB 11.0* 7.5* 7.7*  HCT 36.8 24.4* 25.4*  PLT 412* 302 267   BMET Recent Labs    01/03/21 1858 01/04/21 0258 01/05/21  0215  NA 137 138 136   K 3.6 3.0* 3.3*  CL 107 105 98  CO2 15* 20* 29  GLUCOSE 137* 104* 86  BUN 116* 92* 42*  CREATININE 2.31* 1.66* 0.77  CALCIUM 9.6 8.7* 8.2*   LFT Recent Labs    01/04/21 0306 01/05/21 0215  PROT 6.4* 5.8*  ALBUMIN 3.3* 2.7*  AST 12* 14*  ALT 9 8  ALKPHOS 56 54  BILITOT 0.7 0.5  BILIDIR 0.1  --   IBILI 0.6  --    PT/INR No results for input(s): LABPROT, INR in the last 72 hours.  Studies/Results: CT Head Wo Contrast  Result Date: 01/03/2021 CLINICAL DATA:  Altered level of consciousness, elevated BUN EXAM: CT HEAD WITHOUT CONTRAST TECHNIQUE: Contiguous axial images were obtained from the base of the skull through the vertex without intravenous contrast. COMPARISON:  07/30/2020 FINDINGS: Brain: Stable hypodensities in the periventricular white matter consistent with chronic small vessel ischemic change. No acute infarct or hemorrhage. Lateral ventricles and midline structures are unremarkable. No acute extra-axial fluid collections. No mass effect. Vascular: No hyperdense vessel or unexpected calcification. Skull: Normal. Negative for fracture or focal lesion. Sinuses/Orbits: No acute finding. Other: None. IMPRESSION: 1. Stable chronic small-vessel ischemic changes throughout the white matter. No acute intracranial process. Electronically Signed   By: Sharlet Salina M.D.   On: 01/03/2021 19:58   DG Chest Port 1 View  Result Date: 01/03/2021 CLINICAL DATA:  cough EXAM: PORTABLE CHEST 1 VIEW COMPARISON:  07/30/2020 and prior. FINDINGS: Mild hypoinflation. No pneumothorax or pleural effusion. No focal consolidation. Mild cardiomegaly. Chronic left humeral deformity. IMPRESSION: Mild hypoinflation.  No focal consolidation. Mild cardiomegaly. Electronically Signed   By: Stana Bunting M.D.   On: 01/03/2021 19:04    Impression: Drop in hemoglobin without obvious melena or hematochezia, possible hemodilution. FOBT positive stool but light yellow in color  Renal impairment,  BUN/creatinine/GFR of 116/2.31/29 on admission, after IV fluid resuscitation has improved to 42/0.77/more than 60 Mild hypokalemia Low phosphorus Low total protein of 5.8 and low albumin of 2.7  Multiple comorbidities-dementia, paroxysmal atrial fibrillation, history of seizures   Plan: Since patient is demented, I spoke with her daughter Kendra Smith over the phone, who indicates that patient is NOT to undergo anesthesia or any endoscopic intervention. In fact, she wants to be called if there is a question of requirement for blood transfusions as she may not give permission for blood transfusion either. There is no evidence of melena or hematochezia.  From GI standpoint patient is already on dysphagia level three diet and on pantoprazole 40 mg every 12 hours. Recommend conservative management. GI will sign off, please recall if needed.   LOS: 2 days   Kerin Salen, MD  01/05/2021, 9:54 AM

## 2021-01-05 NOTE — Evaluation (Signed)
Occupational Therapy Evaluation Patient Details Name: Kendra Smith MRN: [id number] DOB: 1926/08/06 Today's Date: 01/05/2021    History of Present Illness Kendra Smith is a 87 y.o. female is a resident at [facility] nursing facility, she was transferred to the ER with decreased responsiveness and abnormal blood work.  In the ED she was found to be slightly tachycardic, with renal impairment, leukocytosis and was started on IV antibiotics and IV fluids. Being treatedf or sepsis secondary to UTI and Acute renal failure superimposed on CKD IIIa.   Clinical Impression   Kendra Smith is a pleasant 87 year old resident of local SNF. On evaluation she demonstrates impaired shoulder ROM of LUE from prior humeral fracture, generalized weakness, decreased activity tolerance and impaired balance. Patient required bed level activities for ADLs - is able to feed herself and perform face washing with set up otherwise needed significant assistance with ADLs. Patient mod x 2 to transfer to edge of bed and stand and exhibits ability to assist with maintaining standing position to pivot to chair. Therapist called [facility] to find out PLOF - which patient appears to be at. No acute care OT needs . Recommend return to LTC facility.     Follow Up Recommendations  No OT follow up;Other (comment)    Equipment Recommendations  None recommended by OT    Recommendations for Other Services       Precautions / Restrictions Precautions Precautions: Fall Precaution Comments: Painful LUE and impaired ROM Restrictions Weight Bearing Restrictions: No      Mobility Bed Mobility Overal bed mobility: Needs Assistance Bed Mobility: Supine to Sit;Sit to Supine     Supine to sit: Mod assist;+2 for physical assistance;+2 for safety/equipment;Supervision Sit to supine: Total assist;+2 for physical assistance;+2 for safety/equipment;HOB elevated        Transfers Overall transfer level: Needs  assistance Equipment used: 2 person hand held assist Transfers: Sit to/from Stand Sit to Stand: Mod assist;+2 physical assistance;+2 safety/equipment         General transfer comment: Took two steps to the left. Difficulty advancing LLE.    Balance Overall balance assessment: History of Falls                                         ADL either performed or assessed with clinical judgement   ADL Overall ADL's : Needs assistance/impaired Eating/Feeding: Set up   Grooming: Set up   Upper Body Bathing: Maximal assistance;Bed level   Lower Body Bathing: Total assistance;Bed level   Upper Body Dressing : Maximal assistance;Bed level   Lower Body Dressing: Total assistance;Bed level   Toilet Transfer: +2 for physical assistance;Moderate assistance;+2 for safety/equipment;BSC Toilet Transfer Details (indicate cue type and reason): as demonstrated by bedside mobility Toileting- Clothing Manipulation and Hygiene: Total assistance               Vision Patient Visual Report: No change from baseline       Perception     Praxis      Pertinent Vitals/Pain Pain Assessment: Faces Faces Pain Scale: Hurts little more Pain Location: LUE Pain Descriptors / Indicators: Grimacing Pain Intervention(s): Limited activity within patient's tolerance     Hand Dominance Right   Extremity/Trunk Assessment Upper Extremity Assessment Upper Extremity Assessment: RUE deficits/detail;LUE deficits/detail;Generalized weakness RUE Deficits / Details: WFL ROM LUE Deficits / Details: Approx 30 degress of flexion tolerated, painful, grossly functional elbow,  wrist and hand ROM.   Lower Extremity Assessment Lower Extremity Assessment: Defer to PT evaluation   Cervical / Trunk Assessment Cervical / Trunk Assessment: Kyphotic   Communication Communication Communication: No difficulties   Cognition Arousal/Alertness: Awake/alert Behavior During Therapy: WFL for tasks  assessed/performed Overall Cognitive Status: History of cognitive impairments - at baseline                                 General Comments: Hx of dementia. Initially knew she was at [facility] but then asked where she was at end of session. Knows she lives at a facility and that she is 50. Can follow all commands. Increased time to answer questions.   General Comments       Exercises     Shoulder Instructions      Home Living Family/patient expects to be discharged to:: Skilled nursing facility                                 Additional Comments: Pt is at [facility]      Prior Functioning/Environment Level of Independence: Needs assistance  Gait / Transfers Assistance Needed: Called [facility] - patient is a pivot to the chair. Had a rapid decline after humeral fracture. ADL's / Homemaking Assistance Needed: needs assistance with ADLs. Can feed herself.            OT Problem List: Decreased strength;Decreased range of motion;Decreased activity tolerance;Impaired balance (sitting and/or standing);Impaired UE functional use;Pain;Decreased cognition      OT Treatment/Interventions:      OT Goals(Current goals can be found in the care plan section) Acute Rehab OT Goals OT Goal Formulation: All assessment and education complete, DC therapy  OT Frequency:     Barriers to D/C:            Co-evaluation PT/OT/SLP Co-Evaluation/Treatment: Yes Reason for Co-Treatment: For patient/therapist safety;To address functional/ADL transfers PT goals addressed during session: Mobility/safety with mobility OT goals addressed during session: ADL's and self-care      AM-PAC OT "6 Clicks" Daily Activity     Outcome Measure Help from another person eating meals?: A Little Help from another person taking care of personal grooming?: A Little Help from another person toileting, which includes using toliet, bedpan, or urinal?: Total Help from  another person bathing (including washing, rinsing, drying)?: A Lot Help from another person to put on and taking off regular upper body clothing?: A Lot Help from another person to put on and taking off regular lower body clothing?: Total 6 Click Score: 12   End of Session Nurse Communication: Mobility status  Activity Tolerance: Patient tolerated treatment well Patient left: in bed;with call bell/phone within reach;with bed alarm set  OT Visit Diagnosis: Muscle weakness (generalized) (M62.81)                Time: 1411-1440 OT Time Calculation (min): 29 min Charges:  OT General Charges $OT Visit: 1 Visit OT Evaluation $OT Eval Moderate Complexity: 1 Mod  Mikaia Janvier, OTR/L Acute Care Rehab Services  Office [phone] Pager: [phone]   Kelli Churn 01/05/2021, 3:56 PM

## 2021-01-06 ENCOUNTER — Other Ambulatory Visit: Payer: Self-pay

## 2021-01-06 DIAGNOSIS — G9341 Metabolic encephalopathy: Secondary | ICD-10-CM | POA: Diagnosis not present

## 2021-01-06 DIAGNOSIS — N179 Acute kidney failure, unspecified: Secondary | ICD-10-CM | POA: Diagnosis not present

## 2021-01-06 DIAGNOSIS — R197 Diarrhea, unspecified: Secondary | ICD-10-CM

## 2021-01-06 DIAGNOSIS — F0391 Unspecified dementia with behavioral disturbance: Secondary | ICD-10-CM | POA: Diagnosis not present

## 2021-01-06 DIAGNOSIS — E872 Acidosis: Secondary | ICD-10-CM | POA: Diagnosis not present

## 2021-01-06 LAB — CBC WITH DIFFERENTIAL/PLATELET
Abs Immature Granulocytes: 0.07 10*3/uL (ref 0.00–0.07)
Basophils Absolute: 0 10*3/uL (ref 0.0–0.1)
Basophils Relative: 0 %
Eosinophils Absolute: 0.3 10*3/uL (ref 0.0–0.5)
Eosinophils Relative: 3 %
HCT: 24.3 % — ABNORMAL LOW (ref 36.0–46.0)
Hemoglobin: 7.3 g/dL — ABNORMAL LOW (ref 12.0–15.0)
Immature Granulocytes: 1 %
Lymphocytes Relative: 24 %
Lymphs Abs: 2.3 10*3/uL (ref 0.7–4.0)
MCH: 25.4 pg — ABNORMAL LOW (ref 26.0–34.0)
MCHC: 30 g/dL (ref 30.0–36.0)
MCV: 84.7 fL (ref 80.0–100.0)
Monocytes Absolute: 0.8 10*3/uL (ref 0.1–1.0)
Monocytes Relative: 9 %
Neutro Abs: 5.9 10*3/uL (ref 1.7–7.7)
Neutrophils Relative %: 63 %
Platelets: 255 10*3/uL (ref 150–400)
RBC: 2.87 MIL/uL — ABNORMAL LOW (ref 3.87–5.11)
RDW: 16.1 % — ABNORMAL HIGH (ref 11.5–15.5)
WBC: 9.4 10*3/uL (ref 4.0–10.5)
nRBC: 0 % (ref 0.0–0.2)

## 2021-01-06 LAB — COMPREHENSIVE METABOLIC PANEL
ALT: 8 U/L (ref 0–44)
AST: 14 U/L — ABNORMAL LOW (ref 15–41)
Albumin: 2.6 g/dL — ABNORMAL LOW (ref 3.5–5.0)
Alkaline Phosphatase: 56 U/L (ref 38–126)
Anion gap: 11 (ref 5–15)
BUN: 20 mg/dL (ref 8–23)
CO2: 31 mmol/L (ref 22–32)
Calcium: 8.1 mg/dL — ABNORMAL LOW (ref 8.9–10.3)
Chloride: 96 mmol/L — ABNORMAL LOW (ref 98–111)
Creatinine, Ser: 0.66 mg/dL (ref 0.44–1.00)
GFR, Estimated: >60 mL/min (ref >60)
Glucose, Bld: 96 mg/dL (ref 70–99)
Potassium: 3.3 mmol/L — ABNORMAL LOW (ref 3.5–5.1)
Sodium: 138 mmol/L (ref 135–145)
Total Bilirubin: 0.4 mg/dL (ref 0.3–1.2)
Total Protein: 5.6 g/dL — ABNORMAL LOW (ref 6.5–8.1)

## 2021-01-06 LAB — PHOSPHORUS: Phosphorus: 2.1 mg/dL — ABNORMAL LOW (ref 2.5–4.6)

## 2021-01-06 LAB — SARS CORONAVIRUS 2 (TAT 6-24 HRS): SARS Coronavirus 2: NEGATIVE

## 2021-01-06 LAB — MAGNESIUM: Magnesium: 1.7 mg/dL (ref 1.7–2.4)

## 2021-01-06 MED ORDER — HYDROCODONE-ACETAMINOPHEN 5-325 MG PO TABS
1.0000 | ORAL_TABLET | Freq: Four times a day (QID) | ORAL | Status: DC | PRN
  Administered 2021-01-06: 1 via ORAL
  Filled 2021-01-06: qty 1

## 2021-01-06 MED ORDER — MAGNESIUM SULFATE 2 GM/50ML IV SOLN
2.0000 g | Freq: Once | INTRAVENOUS | Status: AC
  Administered 2021-01-06: 2 g via INTRAVENOUS
  Filled 2021-01-06: qty 50

## 2021-01-06 MED ORDER — TRAZODONE HCL 50 MG PO TABS
25.0000 mg | ORAL_TABLET | Freq: Every evening | ORAL | Status: DC
  Administered 2021-01-06: 25 mg via ORAL
  Filled 2021-01-06: qty 1

## 2021-01-06 MED ORDER — OLANZAPINE 5 MG PO TBDP
5.0000 mg | ORAL_TABLET | Freq: Every evening | ORAL | Status: DC | PRN
  Administered 2021-01-06: 5 mg via ORAL
  Filled 2021-01-06 (×2): qty 1

## 2021-01-06 MED ORDER — GERHARDT'S BUTT CREAM
TOPICAL_CREAM | Freq: Two times a day (BID) | CUTANEOUS | Status: DC
  Administered 2021-01-07: 1 via TOPICAL
  Filled 2021-01-06: qty 1

## 2021-01-06 MED ORDER — SACCHAROMYCES BOULARDII 250 MG PO CAPS
250.0000 mg | ORAL_CAPSULE | Freq: Two times a day (BID) | ORAL | Status: DC
  Administered 2021-01-06 – 2021-01-07 (×3): 250 mg via ORAL
  Filled 2021-01-06 (×4): qty 1

## 2021-01-06 MED ORDER — POTASSIUM PHOSPHATES 15 MMOLE/5ML IV SOLN
20.0000 mmol | Freq: Once | INTRAVENOUS | Status: AC
  Administered 2021-01-06: 20 mmol via INTRAVENOUS
  Filled 2021-01-06: qty 6.67

## 2021-01-06 MED ORDER — POTASSIUM CHLORIDE CRYS ER 20 MEQ PO TBCR
40.0000 meq | EXTENDED_RELEASE_TABLET | Freq: Two times a day (BID) | ORAL | Status: AC
  Administered 2021-01-06 (×2): 40 meq via ORAL
  Filled 2021-01-06 (×2): qty 2

## 2021-01-06 NOTE — Progress Notes (Signed)
PROGRESS NOTE    Kendra Smith  LSL:[id number] DOB: [birth year] DOA: 01/03/2021 PCP: Chilton Greathouse, MD   Brief Narrative:  HPI per Dr. Odie Sera on 01/03/21 Kendra Smith is a 87 y.o. female with medical history significant for paroxysmal atrial fibrillation, seizures, chronic kidney disease 3 a, and dementia, now presenting to the emergency department for evaluation of decreased responsiveness and abnormal blood work.  Per report of SNF personnel, the patient has had decreased responsiveness and blood work was obtained demonstrating elevated BUN and creatinine.  Patient is unable to provide history in the ED.  Her family notes that at baseline, she is sometimes talkative but is confused, feeds herself, but requires assistance with all other activities and has been requiring a wheelchair.  ED Course: Upon arrival to the ED, patient is found to have a rectal temperature of 37.9, saturating upper 90s on room air, normal respirations, normal blood pressure, and tachycardia in the low 100s.  EKG features ectopic atrial tachycardia with PVCs, LVH, and IVCD.  Chest x-ray demonstrates mild cardiomegaly and mild hypoinflation.  Noncontrast head CT is negative for acute intracranial abnormality.  Chemistry panel notable for bicarbonate of 15, BUN 116, and creatinine 2.31.  CBC demonstrates a leukocytosis to 21,900 and mild thrombocytosis.  Troponin is normal.  COVID-19 PCR is pending.  Lactic acid is pending.  Blood and urine cultures were ordered and the patient was given 2 L of LR, vancomycin, cefepime, and Flagyl in the ED.  **Interim History Patient has been intermittently hypotensive and so she was given another 2 L bolus.  She still remains significantly confused and is only alert and oriented x1 but renal function is trending downwards along with the rest of her labs but her Blood Count dropped and initially thought to be dilutional but she is FOBT Positive so will change PPI to IV BID and  consult with Gastroenterology for further evaluation. Patient is not actively bleeding and family does not want invasive GI workup so GI has signed off the case and will continue to Monitor carefully; Electrolytes are being replete today. Anticipating D/C'ing back to SNF in the AM as we will continue Abx today and tomorrow and repeat COVID testing.   Nursing states that she is having more diarrhea overnight but this is a small volume.  We will add probiotics in addition given that she has been taking antibiotics.  I do not clinically feel that this is C. difficile given that her white blood cell count has resolved and she is afebrile.  Assessment & Plan:   Principal Problem:   Acute renal failure superimposed on stage 3a chronic kidney disease (HCC) Active Problems:   Dementia (HCC)   PAF (paroxysmal atrial fibrillation) (HCC)   Seizure (HCC)   Severe sepsis (HCC)   Metabolic acidosis   Acute metabolic encephalopathy   Pressure injury of skin  Acute renal failure superimposed on CKD IIIa; improving  Metabolic Acidosis   - Presents from SNF with decreased responsiveness and increased BUN and creatinine and is found to have BUN 116 and SCr 2.31, up from 11 and 1.04 in July  - She appears hypovolemic and was fluid-resuscitated in ED with 2 liters LR  and still appears a little dry in the colon was hypotensive this morning with low maps so was given another 2 L normal saline bolus and she continued on the sodium bicarb drip with 150 mEq of sodium bicarb and at 100 MLS per hour and will reduce the rate to  75 MLS per hour and now Mainteance IVF now stopped -Discontinued IVF hydration with isotonic bicarbonate as above -BUN/creatinine is improved as it has gone from 116/2.31 -> 92/1.66 -> 42/0.77 and today it sis 20/0.66 -Her acidosis is improving as well as her CO2 was 15 and is now 31, anion gap is now 11, chloride level 96 -Avoid nephrotoxic medications, contrast dyes, hypotension and renally  dose medications  -Continue monitor and trend renal function carefully and repeat CMP in a.m.  Sepsis secondary to UTI  - Presents from SNF due to decreased responsiveness and acute renal failure and is found to have tachycardia and leukocytosis with clear CXR, no respiratory sxs, benign abdominal exam, no meningismus or apparent cellulitis, and UA compatible with possible infection  -Urinalysis showed a hazy appearance with moderate leukocytes, many bacteria, 0-5 RBCs per high-power field, 11-20 WBCs with urine culture showed Multiple Species Present as well as blood cultures x2 showing NGTD at 1 Day WBC trending down from 21.9 -> 16.2 -> 12.0 -> 9.4 - Cultures are being collected in ED, lactate is pending, and she was fluid-resuscitated and started on broad-spectrum antibiotics  and she is now on IV ceftriaxone but will add Florastor given her diarrhea -Follow-up lactate was 1.2, follow cultures,  -Continue Empriic Antibiotics with IV Ceftriaxone for now despite Urine Cx stating Multiple Species Present  -Will complete 5 Days of Abx and today is Day 4/5   Hypotension, ? From Blood Loss vs Infection versus diarrhea -In the setting of above -Given additional 2 L bolus (total the day before yeseterday)and continue monitor blood pressures per protocol -Was getting maintenance IV fluid but was reduced to 75 mL yesterday and now stopped  -Continue to Monitor BP per Protocol  -BP's have remained soft but now have improved and BP is 135/42 -She continues to have diarrhea to very small volume per nursing but blood pressures are improved  Acute Encephalopathy Superimposed on Chronic Dementia  - Patient has dementia and per family she is often talkative but confused on baseline, is able to feed herself but requires assistance with all other activities   -Currently is only alert and oriented x1 and appears close to baseline or is at baseline - Per SNF personnel, she was less responsive pta; no acute  findings on head CT, likely related to infection and/or uremia  -This seems to be improving now -We will place on delirium precautions -Continue with Donepezil 5 mg p.o. nightly and Duloxetine 60 mg p.o. with breakfast -IV fluid resuscitation and antibiotics as above  Paroxysmal Atrial fibrillation  - In and out of atrial fibrillation in ED; In Atrial Fib this AM with HR in the Mid 100's - Teens  -CHADS-VASc at least 55 (age x2, gender)    -We will resume her digoxin 0.125 mg p.o. every other day and check a digoxin level -Follow-up pharmacy medication reconciliation but does not appear to be anticoagulated  -She currently remains in A. fib with slightly elevated tachycardia with heart rates in the mid 100s -Continue telemetry monitoring  Diarrhea  -Probably the cause of her abnormal electrolytes -Continues to be small volume -Question if this is antibiotic mediated -Nursing states that she had 5 episodes yesterday and has had very small 2 bowel movements after eating and question if she is not digesting properly. -We will consult nutrition for feeding supplementation and add Florastor probiotics given that she is taking antibiotics. -Continue to monitor diarrhea and if necessary we will add Lomotil versus Imodium  Acquired Thrombophilia -As above is not anticoagulated  Glaucoma -Continue with Timolol and Latanoprost  GERD -Change  PPI Pantoprazole 40 mg daily to IV 40 mg po BID for now and will change to po BID  Seizures  -Continue with Levetiracetam 500 mg p.o.   Hypokalemia -Mild. K+ was 3.3 and likely in the setting of diarrhea -Replete with po KCl 40 mEQ BID x2 and IV K Phos 20 mmol  -Continue to Monitor and Replete as Necessary -Repeat CMP in the AM   Leukocytosis -Could have been from significant dehydration but also in the setting of Infection -Patient's WBC went from 21.9 -> 16.2 -> 12.0 -> 9.4 -C/w Empiric Abx As above -Continue to Monitor for S/Sx of  Infection -Repeat CBC in the AM   Hypophosphatemia -Patient's Phos Level is now 2.1-in the setting of diarrhea -Replete with IV K Phos 20 mmol -Continue to Monitor and Replete as Necessary -Repeat Phos Level in the AM   Normocytic Anemia with Concern for Blood Loss -Likely was hemoconcentrated on admission had a dilutional drop as hemoglobin/hematocrit went from 11.0/36.8 -> 7.5/24.4 -> 7.7/25.4 -> today it is 7.3/24.3 -Check FOBT and was POSITIVE; Will Discontinue Heparin 5,000 units sq and start PPI as above -Obtain anemia panel this AM;  -Will consult Gastroenterology for further evaluation and they have signed off the case given that the patient's daughter does not want the patient to undergo anesthesia or endoscopic intervention and wants to be called with blood transfusions are required. -GI has signed off the case given that there is no evidence of melena or hematochezia and they recommend conservative management and patient remains on a dysphagia 3 level diet as well as PPI every 12 and will changes to twice daily -Continue to monitor for signs and symptoms of bleeding; currently no overt bleeding noted -Repeat CBC in a.m.  GOC: DNR, poA  DVT prophylaxis: Heparin 5,000 units sq q8h now held Code Status: DO NOT RESUSCITATE Family Communication: No family present at bedside Disposition Plan: Pending further clinical improvement back to baseline and improvement in Renal Fxn  Status is: Inpatient  Remains inpatient appropriate because:Unsafe d/c plan, IV treatments appropriate due to intensity of illness or inability to take PO and Inpatient level of care appropriate due to severity of illness   Dispo: The patient is from: SNF              Anticipated d/c is to: SNF              Patient currently is not medically stable to d/c.   Difficult to place patient No  Consultants:   Gastroenterology    Procedures: None  Antimicrobials:  Anti-infectives (From admission, onward)    Start     Dose/Rate Route Frequency Ordered Stop   01/04/21 1000  cefTRIAXone (ROCEPHIN) 1 g in sodium chloride 0.9 % 100 mL IVPB        1 g 200 mL/hr over 30 Minutes Intravenous Every 24 hours 01/03/21 2057     01/03/21 2000  ceFEPIme (MAXIPIME) 2 g in sodium chloride 0.9 % 100 mL IVPB        2 g 200 mL/hr over 30 Minutes Intravenous  Once 01/03/21 1952 01/03/21 2052   01/03/21 2000  metroNIDAZOLE (FLAGYL) IVPB 500 mg        500 mg 100 mL/hr over 60 Minutes Intravenous  Once 01/03/21 1952 01/03/21 2157   01/03/21 2000  vancomycin (VANCOCIN) IVPB 1000 mg/200 mL premix  1,000 mg 200 mL/hr over 60 Minutes Intravenous  Once 01/03/21 1952 01/03/21 2216        Subjective: Seen and examined at bedside was little bit more confused today and unable to provide a subjective history.  Nursing states that she is been intermittently agitated.  Having small-volume diarrhea and nursing feels it is happened right after she eats.  We will get nutritionist involved for feeding supplementation.  Her labs have been improving.  We will get her up and out of bed today.  No other concerns or plans at this time and patient remains confused.  Objective: Vitals:   01/05/21 2014 01/06/21 0000 01/06/21 0018 01/06/21 0400  BP:  (!) 126/42  (!) 135/42  Pulse:  (!) 103  100  Resp:  16  18  Temp: 98.3 F (36.8 C)  97.8 F (36.6 C) 98.2 F (36.8 C)  TempSrc: Oral  Oral Axillary  SpO2:  100%  99%  Weight:      Height:        Intake/Output Summary (Last 24 hours) at 01/06/2021 0759 Last data filed at 01/06/2021 0500 Gross per 24 hour  Intake 1078.6 ml  Output 900 ml  Net 178.6 ml   Filed Weights   01/03/21 1810 01/04/21 0244  Weight: 58 kg 45.8 kg   Examination: Physical Exam:  Constitutional: Patient is a thin Caucasian female currently in no acute distress and is extremely confused and a little agitated today but likely at baseline given her dementia  Eyes: Lids and conjunctivae normal,  sclerae anicteric  ENMT: External Ears, Nose appear normal. Grossly normal hearing. Neck: Appears normal, supple, no cervical masses, normal ROM, no appreciable thyromegaly; no JVD Respiratory: Diminished to auscultation bilaterally with coarse breath sounds, no wheezing, rales, rhonchi or crackles. Normal respiratory effort and patient is not tachypenic. No accessory muscle use.  Wearing supplemental oxygen via nasal cannula Cardiovascular: RRR, no murmurs / rubs / gallops. S1 and S2 auscultated.  No appreciable extremity edema Abdomen: Soft, slightly tender to palpation lower abdomen, non-distended. Bowel sounds positive.  GU: Deferred. Musculoskeletal: No clubbing / cyanosis of digits/nails. No joint deformity upper and lower extremities.  Skin: No rashes, lesions, ulcers on limited skin evaluation. No induration; Warm and dry.  Neurologic: CN 2-12 grossly intact with no focal deficits. Romberg sign and cerebellar reflexes not assessed.  Psychiatric: Impaired judgment and insight. Alert and oriented x 1. Agitated mood and appropriate affect.   Data Reviewed: I have personally reviewed following labs and imaging studies  CBC: Recent Labs  Lab 01/03/21 1858 01/04/21 0258 01/05/21 0215 01/06/21 0313  WBC 21.9* 16.3* 12.0* 9.4  NEUTROABS 17.8*  --  8.9* 5.9  HGB 11.0* 7.5* 7.7* 7.3*  HCT 36.8 24.4* 25.4* 24.3*  MCV 83.6 83.6 83.3 84.7  PLT 412* 302 267 255   Basic Metabolic Panel: Recent Labs  Lab 01/03/21 1858 01/04/21 0258 01/04/21 0306 01/05/21 0215 01/06/21 0313  NA 137 138  --  136 138  K 3.6 3.0*  --  3.3* 3.3*  CL 107 105  --  98 96*  CO2 15* 20*  --  29 31  GLUCOSE 137* 104*  --  86 96  BUN 116* 92*  --  42* 20  CREATININE 2.31* 1.66*  --  0.77 0.66  CALCIUM 9.6 8.7*  --  8.2* 8.1*  MG  --   --  1.6* 1.9 1.7  PHOS  --   --  3.0 1.1* 2.1*   GFR:  Estimated Creatinine Clearance: 32.4 mL/min (by C-G formula based on SCr of 0.66 mg/dL). Liver Function Tests: Recent  Labs  Lab 01/03/21 1858 01/04/21 0306 01/05/21 0215 01/06/21 0313  AST 13* 12* 14* 14*  ALT ALKPHOS 82 56 54 56  BILITOT 0.6 0.7 0.5 0.4  PROT 8.5* 6.4* 5.8* 5.6*  ALBUMIN 3.5 3.3* 2.7* 2.6*   No results for input(s): LIPASE, AMYLASE in the last 168 hours. No results for input(s): AMMONIA in the last 168 hours. Coagulation Profile: No results for input(s): INR, PROTIME in the last 168 hours. Cardiac Enzymes: No results for input(s): CKTOTAL, CKMB, CKMBINDEX, TROPONINI in the last 168 hours. BNP (last 3 results) No results for input(s): PROBNP in the last 8760 hours. HbA1C: No results for input(s): HGBA1C in the last 72 hours. CBG: No results for input(s): GLUCAP in the last 168 hours. Lipid Profile: No results for input(s): CHOL, HDL, LDLCALC, TRIG, CHOLHDL, LDLDIRECT in the last 72 hours. Thyroid Function Tests: No results for input(s): TSH, T4TOTAL, FREET4, T3FREE, THYROIDAB in the last 72 hours. Anemia Panel: No results for input(s): VITAMINB12, FOLATE, FERRITIN, TIBC, IRON, RETICCTPCT in the last 72 hours. Sepsis Labs: Recent Labs  Lab 01/03/21 2012  LATICACIDVEN 1.9    Recent Results (from the past 240 hour(s))  Urine Culture     Status: Abnormal   Collection Time: 01/03/21  6:51 PM   Specimen: Urine, Clean Catch  Result Value Ref Range Status   Specimen Description   Final    URINE, CLEAN CATCH Performed at [facility], [address] [place], [place] [address]    Special Requests   Final    NONE Performed at [facility], [address] [place], [place] [address]    Culture MULTIPLE SPECIES PRESENT, SUGGEST RECOLLECTION (A)  Final   Report Status 01/05/2021 FINAL  Final  Resp Panel by RT-PCR (Flu A&B, Covid) Nasopharyngeal Swab     Status: None   Collection Time: 01/03/21  7:00 PM   Specimen: Nasopharyngeal Swab; Nasopharyngeal(NP) swabs in vial transport medium  Result Value Ref Range Status   SARS  Coronavirus 2 by RT PCR NEGATIVE NEGATIVE Final    Comment: (NOTE) SARS-CoV-2 target nucleic acids are NOT DETECTED.  The SARS-CoV-2 RNA is generally detectable in upper respiratory specimens during the acute phase of infection. The lowest concentration of SARS-CoV-2 viral copies this assay can detect is 138 copies/mL. A negative result does not preclude SARS-Cov-2 infection and should not be used as the sole basis for treatment or other patient management decisions. A negative result may occur with  improper specimen collection/handling, submission of specimen other than nasopharyngeal swab, presence of viral mutation(s) within the areas targeted by this assay, and inadequate number of viral copies(<138 copies/mL). A negative result must be combined with clinical observations, patient history, and epidemiological information. The expected result is Negative.  Fact Sheet for Patients:  BloggerCourse.com  Fact Sheet for Healthcare Providers:  SeriousBroker.it  This test is no t yet approved or cleared by the [place] FDA and  has been authorized for detection and/or diagnosis of SARS-CoV-2 by FDA under an Emergency Use Authorization (EUA). This EUA will remain  in effect (meaning this test can be used) for the duration of the COVID-19 declaration under Section 564(b)(1) of the Act, 21 U.S.C.section 360bbb-3(b)(1), unless the authorization is terminated  or revoked sooner.       Influenza A by PCR NEGATIVE NEGATIVE Final  Influenza B by PCR NEGATIVE NEGATIVE Final    Comment: (NOTE) The Xpert Xpress SARS-CoV-2/FLU/RSV plus assay is intended as an aid in the diagnosis of influenza from Nasopharyngeal swab specimens and should not be used as a sole basis for treatment. Nasal washings and aspirates are unacceptable for Xpert Xpress SARS-CoV-2/FLU/RSV testing.  Fact Sheet for  Patients: BloggerCourse.com  Fact Sheet for Healthcare Providers: SeriousBroker.it  This test is not yet approved or cleared by the [place] FDA and has been authorized for detection and/or diagnosis of SARS-CoV-2 by FDA under an Emergency Use Authorization (EUA). This EUA will remain in effect (meaning this test can be used) for the duration of the COVID-19 declaration under Section 564(b)(1) of the Act, 21 U.S.C. section 360bbb-3(b)(1), unless the authorization is terminated or revoked.  Performed at [facility], [address], [place], [place] [address]   Culture, blood (Routine X 2) w Reflex to ID Panel     Status: None (Preliminary result)   Collection Time: 01/03/21  7:57 PM   Specimen: BLOOD  Result Value Ref Range Status   Specimen Description   Final    BLOOD LEFT ANTECUBITAL Performed at [facility], [address], [place], [place] [address]    Special Requests   Final    BOTTLES DRAWN AEROBIC AND ANAEROBIC Blood Culture results may not be optimal due to an excessive volume of blood received in culture bottles Performed at [facility], [address], [place], [place] [address]    Culture   Final    NO GROWTH 1 DAY Performed at [facility], [address] [place], [place] [address]    Report Status PENDING  Incomplete  Culture, blood (Routine X 2) w Reflex to ID Panel     Status: None (Preliminary result)   Collection Time: 01/03/21  8:15 PM   Specimen: BLOOD RIGHT HAND  Result Value Ref Range Status   Specimen Description   Final    BLOOD RIGHT HAND Performed at [facility], [address], [place], [place] [address]    Special Requests   Final    BOTTLES DRAWN AEROBIC AND ANAEROBIC Blood Culture adequate volume Performed at [facility], [address], [place], [place] [address]    Culture   Final     NO GROWTH 1 DAY Performed at [facility], [address], [place], [place] [address]    Report Status PENDING  Incomplete  MRSA PCR Screening     Status: None   Collection Time: 01/04/21  2:52 AM   Specimen: Nasopharyngeal  Result Value Ref Range Status   MRSA by PCR NEGATIVE NEGATIVE Final    Comment:        The GeneXpert MRSA Assay (FDA approved for NASAL specimens only), is one component of a comprehensive MRSA colonization surveillance program. It is not intended to diagnose MRSA infection nor to guide or monitor treatment for MRSA infections. Performed at [facility], [address], [place], [place] [address]     RN Pressure Injury Documentation: Pressure Injury 01/04/21 Buttocks Right;Left Stage 2 -  Partial thickness loss of dermis presenting as a shallow open injury with a red, pink wound bed without slough. (Active)  01/04/21 0308  Location: Buttocks  Location Orientation: Right;Left  Staging: Stage 2 -  Partial thickness loss of dermis presenting as a shallow open injury with a red, pink wound bed without slough.  Wound Description (Comments):   Present on Admission: Yes    Estimated body mass index is 17.89 kg/m as calculated from the following:   Height as of this encounter: 5\' 3"  (1.6 m).   Weight as of this encounter: 45.8 kg.  Malnutrition Type:   Malnutrition Characteristics:    Nutrition Interventions:    Radiology Studies: No results found. Scheduled Meds: . Chlorhexidine Gluconate Cloth  6 each Topical Daily  . cholecalciferol  1,000 Units Oral Q breakfast  . digoxin  0.125 mg Oral QODAY  . donepezil  5 mg Oral QHS  . DULoxetine  60 mg Oral Q breakfast  . latanoprost  1 drop Both Eyes QHS  . levETIRAcetam  500 mg Oral BID  . mouth rinse  15 mL Mouth Rinse BID  . pantoprazole (PROTONIX) IV  40 mg Intravenous Q12H  . potassium chloride  40 mEq Oral BID  . sodium chloride flush  3 mL Intravenous Q12H  . timolol   1 drop Both Eyes Daily   Continuous Infusions: . sodium chloride Stopped (01/04/21 1435)  . cefTRIAXone (ROCEPHIN)  IV Stopped (01/05/21 1000)  . magnesium sulfate bolus IVPB    . potassium PHOSPHATE IVPB (in mmol)      LOS: 3 days    01/07/21, DO [facility] PAGER is on AMION  If 7PM-7AM, please contact night-coverage [email or web address]

## 2021-01-06 NOTE — Progress Notes (Signed)
OT Cancellation Note  Patient Details Name: Kendra Smith MRN: [id number] DOB: 30-Sep-1926   Cancelled Treatment:    Reason Eval/Treat Not Completed: Other (comment). Received another OT order today. Refer to yesterday's evaluation.   Amadi Yoshino L Jermario Kalmar 01/06/2021, 2:28 PM

## 2021-01-06 NOTE — Plan of Care (Signed)
  Problem: Coping: Goal: Level of anxiety will decrease Outcome: Progressing   Problem: Pain Managment: Goal: General experience of comfort will improve Outcome: Progressing   Problem: Education: Goal: Knowledge of General Education information will improve Description: Including pain rating scale, medication(s)/side effects and non-pharmacologic comfort measures Outcome: Not Progressing

## 2021-01-06 NOTE — Plan of Care (Signed)
?  Problem: Education: ?Goal: Knowledge of General Education information will improve ?Description: Including pain rating scale, medication(s)/side effects and non-pharmacologic comfort measures ?Outcome: Progressing ?  ?Problem: Nutrition: ?Goal: Adequate nutrition will be maintained ?Outcome: Progressing ?  ?Problem: Elimination: ?Goal: Will not experience complications related to bowel motility ?Outcome: Progressing ?Goal: Will not experience complications related to urinary retention ?Outcome: Progressing ?  ?Problem: Safety: ?Goal: Ability to remain free from injury will improve ?Outcome: Progressing ?  ?Problem: Skin Integrity: ?Goal: Risk for impaired skin integrity will decrease ?Outcome: Progressing ?  ?

## 2021-01-07 DIAGNOSIS — U071 COVID-19: Secondary | ICD-10-CM | POA: Diagnosis not present

## 2021-01-07 DIAGNOSIS — S3993XA Unspecified injury of pelvis, initial encounter: Secondary | ICD-10-CM | POA: Diagnosis not present

## 2021-01-07 DIAGNOSIS — R410 Disorientation, unspecified: Secondary | ICD-10-CM | POA: Diagnosis not present

## 2021-01-07 DIAGNOSIS — A419 Sepsis, unspecified organism: Principal | ICD-10-CM

## 2021-01-07 DIAGNOSIS — Z043 Encounter for examination and observation following other accident: Secondary | ICD-10-CM | POA: Diagnosis not present

## 2021-01-07 DIAGNOSIS — Z7401 Bed confinement status: Secondary | ICD-10-CM | POA: Diagnosis not present

## 2021-01-07 DIAGNOSIS — S32512A Fracture of superior rim of left pubis, initial encounter for closed fracture: Secondary | ICD-10-CM | POA: Diagnosis not present

## 2021-01-07 DIAGNOSIS — N289 Disorder of kidney and ureter, unspecified: Secondary | ICD-10-CM | POA: Diagnosis not present

## 2021-01-07 DIAGNOSIS — M549 Dorsalgia, unspecified: Secondary | ICD-10-CM | POA: Diagnosis not present

## 2021-01-07 DIAGNOSIS — G9340 Encephalopathy, unspecified: Secondary | ICD-10-CM | POA: Diagnosis not present

## 2021-01-07 DIAGNOSIS — Y92129 Unspecified place in nursing home as the place of occurrence of the external cause: Secondary | ICD-10-CM | POA: Diagnosis not present

## 2021-01-07 DIAGNOSIS — I2510 Atherosclerotic heart disease of native coronary artery without angina pectoris: Secondary | ICD-10-CM | POA: Diagnosis not present

## 2021-01-07 DIAGNOSIS — R278 Other lack of coordination: Secondary | ICD-10-CM | POA: Diagnosis not present

## 2021-01-07 DIAGNOSIS — F331 Major depressive disorder, recurrent, moderate: Secondary | ICD-10-CM | POA: Diagnosis not present

## 2021-01-07 DIAGNOSIS — R531 Weakness: Secondary | ICD-10-CM | POA: Diagnosis not present

## 2021-01-07 DIAGNOSIS — S0003XA Contusion of scalp, initial encounter: Secondary | ICD-10-CM | POA: Diagnosis not present

## 2021-01-07 DIAGNOSIS — G4089 Other seizures: Secondary | ICD-10-CM | POA: Diagnosis not present

## 2021-01-07 DIAGNOSIS — M2550 Pain in unspecified joint: Secondary | ICD-10-CM | POA: Diagnosis not present

## 2021-01-07 DIAGNOSIS — R102 Pelvic and perineal pain: Secondary | ICD-10-CM | POA: Diagnosis not present

## 2021-01-07 DIAGNOSIS — R569 Unspecified convulsions: Secondary | ICD-10-CM | POA: Diagnosis not present

## 2021-01-07 DIAGNOSIS — I4891 Unspecified atrial fibrillation: Secondary | ICD-10-CM | POA: Diagnosis not present

## 2021-01-07 DIAGNOSIS — S42202D Unspecified fracture of upper end of left humerus, subsequent encounter for fracture with routine healing: Secondary | ICD-10-CM | POA: Diagnosis not present

## 2021-01-07 DIAGNOSIS — I10 Essential (primary) hypertension: Secondary | ICD-10-CM | POA: Diagnosis not present

## 2021-01-07 DIAGNOSIS — G40901 Epilepsy, unspecified, not intractable, with status epilepticus: Secondary | ICD-10-CM | POA: Diagnosis not present

## 2021-01-07 DIAGNOSIS — W050XXA Fall from non-moving wheelchair, initial encounter: Secondary | ICD-10-CM | POA: Diagnosis not present

## 2021-01-07 DIAGNOSIS — E872 Acidosis: Secondary | ICD-10-CM | POA: Diagnosis not present

## 2021-01-07 DIAGNOSIS — W19XXXD Unspecified fall, subsequent encounter: Secondary | ICD-10-CM | POA: Diagnosis not present

## 2021-01-07 DIAGNOSIS — F329 Major depressive disorder, single episode, unspecified: Secondary | ICD-10-CM | POA: Diagnosis not present

## 2021-01-07 DIAGNOSIS — D500 Iron deficiency anemia secondary to blood loss (chronic): Secondary | ICD-10-CM | POA: Diagnosis not present

## 2021-01-07 DIAGNOSIS — S199XXA Unspecified injury of neck, initial encounter: Secondary | ICD-10-CM | POA: Diagnosis not present

## 2021-01-07 DIAGNOSIS — R279 Unspecified lack of coordination: Secondary | ICD-10-CM | POA: Diagnosis not present

## 2021-01-07 DIAGNOSIS — I6782 Cerebral ischemia: Secondary | ICD-10-CM | POA: Diagnosis not present

## 2021-01-07 DIAGNOSIS — L89322 Pressure ulcer of left buttock, stage 2: Secondary | ICD-10-CM | POA: Diagnosis not present

## 2021-01-07 DIAGNOSIS — I129 Hypertensive chronic kidney disease with stage 1 through stage 4 chronic kidney disease, or unspecified chronic kidney disease: Secondary | ICD-10-CM | POA: Diagnosis not present

## 2021-01-07 DIAGNOSIS — H409 Unspecified glaucoma: Secondary | ICD-10-CM | POA: Diagnosis not present

## 2021-01-07 DIAGNOSIS — Z515 Encounter for palliative care: Secondary | ICD-10-CM | POA: Diagnosis not present

## 2021-01-07 DIAGNOSIS — N1831 Chronic kidney disease, stage 3a: Secondary | ICD-10-CM | POA: Diagnosis not present

## 2021-01-07 DIAGNOSIS — R197 Diarrhea, unspecified: Secondary | ICD-10-CM | POA: Diagnosis not present

## 2021-01-07 DIAGNOSIS — L89302 Pressure ulcer of unspecified buttock, stage 2: Secondary | ICD-10-CM | POA: Diagnosis not present

## 2021-01-07 DIAGNOSIS — S32492A Other specified fracture of left acetabulum, initial encounter for closed fracture: Secondary | ICD-10-CM | POA: Diagnosis not present

## 2021-01-07 DIAGNOSIS — D649 Anemia, unspecified: Secondary | ICD-10-CM | POA: Diagnosis not present

## 2021-01-07 DIAGNOSIS — G319 Degenerative disease of nervous system, unspecified: Secondary | ICD-10-CM | POA: Diagnosis not present

## 2021-01-07 DIAGNOSIS — D508 Other iron deficiency anemias: Secondary | ICD-10-CM | POA: Diagnosis not present

## 2021-01-07 DIAGNOSIS — W19XXXA Unspecified fall, initial encounter: Secondary | ICD-10-CM | POA: Diagnosis not present

## 2021-01-07 DIAGNOSIS — K219 Gastro-esophageal reflux disease without esophagitis: Secondary | ICD-10-CM | POA: Diagnosis not present

## 2021-01-07 DIAGNOSIS — F0390 Unspecified dementia without behavioral disturbance: Secondary | ICD-10-CM | POA: Diagnosis not present

## 2021-01-07 DIAGNOSIS — I480 Paroxysmal atrial fibrillation: Secondary | ICD-10-CM | POA: Diagnosis not present

## 2021-01-07 DIAGNOSIS — M47812 Spondylosis without myelopathy or radiculopathy, cervical region: Secondary | ICD-10-CM | POA: Diagnosis not present

## 2021-01-07 DIAGNOSIS — F0391 Unspecified dementia with behavioral disturbance: Secondary | ICD-10-CM | POA: Diagnosis not present

## 2021-01-07 DIAGNOSIS — Z8616 Personal history of COVID-19: Secondary | ICD-10-CM | POA: Diagnosis not present

## 2021-01-07 DIAGNOSIS — R0902 Hypoxemia: Secondary | ICD-10-CM | POA: Diagnosis not present

## 2021-01-07 DIAGNOSIS — G9341 Metabolic encephalopathy: Secondary | ICD-10-CM | POA: Diagnosis not present

## 2021-01-07 DIAGNOSIS — S32392A Other fracture of left ilium, initial encounter for closed fracture: Secondary | ICD-10-CM | POA: Diagnosis not present

## 2021-01-07 DIAGNOSIS — R4182 Altered mental status, unspecified: Secondary | ICD-10-CM | POA: Diagnosis not present

## 2021-01-07 DIAGNOSIS — S4982XA Other specified injuries of left shoulder and upper arm, initial encounter: Secondary | ICD-10-CM | POA: Diagnosis not present

## 2021-01-07 DIAGNOSIS — M6281 Muscle weakness (generalized): Secondary | ICD-10-CM | POA: Diagnosis not present

## 2021-01-07 DIAGNOSIS — M810 Age-related osteoporosis without current pathological fracture: Secondary | ICD-10-CM | POA: Diagnosis not present

## 2021-01-07 DIAGNOSIS — R059 Cough, unspecified: Secondary | ICD-10-CM | POA: Diagnosis not present

## 2021-01-07 DIAGNOSIS — E785 Hyperlipidemia, unspecified: Secondary | ICD-10-CM | POA: Diagnosis not present

## 2021-01-07 DIAGNOSIS — N390 Urinary tract infection, site not specified: Secondary | ICD-10-CM

## 2021-01-07 DIAGNOSIS — R251 Tremor, unspecified: Secondary | ICD-10-CM | POA: Diagnosis not present

## 2021-01-07 DIAGNOSIS — N179 Acute kidney failure, unspecified: Secondary | ICD-10-CM | POA: Diagnosis not present

## 2021-01-07 DIAGNOSIS — J9811 Atelectasis: Secondary | ICD-10-CM | POA: Diagnosis not present

## 2021-01-07 DIAGNOSIS — Z743 Need for continuous supervision: Secondary | ICD-10-CM | POA: Diagnosis not present

## 2021-01-07 LAB — CBC WITH DIFFERENTIAL/PLATELET
Abs Immature Granulocytes: 0.06 10*3/uL (ref 0.00–0.07)
Basophils Absolute: 0 10*3/uL (ref 0.0–0.1)
Basophils Relative: 0 %
Eosinophils Absolute: 0.4 10*3/uL (ref 0.0–0.5)
Eosinophils Relative: 4 %
HCT: 26.8 % — ABNORMAL LOW (ref 36.0–46.0)
Hemoglobin: 8.1 g/dL — ABNORMAL LOW (ref 12.0–15.0)
Immature Granulocytes: 1 %
Lymphocytes Relative: 26 %
Lymphs Abs: 2.1 10*3/uL (ref 0.7–4.0)
MCH: 25.6 pg — ABNORMAL LOW (ref 26.0–34.0)
MCHC: 30.2 g/dL (ref 30.0–36.0)
MCV: 84.5 fL (ref 80.0–100.0)
Monocytes Absolute: 0.7 10*3/uL (ref 0.1–1.0)
Monocytes Relative: 9 %
Neutro Abs: 4.9 10*3/uL (ref 1.7–7.7)
Neutrophils Relative %: 60 %
Platelets: 303 10*3/uL (ref 150–400)
RBC: 3.17 MIL/uL — ABNORMAL LOW (ref 3.87–5.11)
RDW: 16.2 % — ABNORMAL HIGH (ref 11.5–15.5)
WBC: 8.1 10*3/uL (ref 4.0–10.5)
nRBC: 0 % (ref 0.0–0.2)

## 2021-01-07 LAB — COMPREHENSIVE METABOLIC PANEL
ALT: 8 U/L (ref 0–44)
AST: 12 U/L — ABNORMAL LOW (ref 15–41)
Albumin: 2.7 g/dL — ABNORMAL LOW (ref 3.5–5.0)
Alkaline Phosphatase: 54 U/L (ref 38–126)
Anion gap: 9 (ref 5–15)
BUN: 11 mg/dL (ref 8–23)
CO2: 33 mmol/L — ABNORMAL HIGH (ref 22–32)
Calcium: 8.5 mg/dL — ABNORMAL LOW (ref 8.9–10.3)
Chloride: 98 mmol/L (ref 98–111)
Creatinine, Ser: 0.69 mg/dL (ref 0.44–1.00)
GFR, Estimated: >60 mL/min (ref >60)
Glucose, Bld: 105 mg/dL — ABNORMAL HIGH (ref 70–99)
Potassium: 4.3 mmol/L (ref 3.5–5.1)
Sodium: 140 mmol/L (ref 135–145)
Total Bilirubin: 0.3 mg/dL (ref 0.3–1.2)
Total Protein: 6.1 g/dL — ABNORMAL LOW (ref 6.5–8.1)

## 2021-01-07 LAB — MAGNESIUM: Magnesium: 2.1 mg/dL (ref 1.7–2.4)

## 2021-01-07 LAB — PHOSPHORUS: Phosphorus: 3.9 mg/dL (ref 2.5–4.6)

## 2021-01-07 MED ORDER — TRAMADOL HCL 50 MG PO TABS: 50.0000 mg | ORAL_TABLET | Freq: Three times a day (TID) | ORAL | 0 refills | Status: DC | PRN

## 2021-01-07 MED ORDER — ONDANSETRON HCL 4 MG PO TABS: 4.0000 mg | ORAL_TABLET | Freq: Four times a day (QID) | ORAL | 0 refills | Status: AC | PRN

## 2021-01-07 MED ORDER — SACCHAROMYCES BOULARDII 250 MG PO CAPS: 250.0000 mg | ORAL_CAPSULE | Freq: Two times a day (BID) | ORAL | Status: AC

## 2021-01-07 MED ORDER — GERHARDT'S BUTT CREAM: 1.0000 "application " | TOPICAL_CREAM | Freq: Two times a day (BID) | CUTANEOUS | Status: DC

## 2021-01-07 MED ORDER — HYDROCODONE-ACETAMINOPHEN 5-325 MG PO TABS: 1.0000 | ORAL_TABLET | Freq: Four times a day (QID) | ORAL | 0 refills | Status: DC | PRN

## 2021-01-07 NOTE — TOC Transition Note (Addendum)
Transition of Care [facility]) - CM/SW Discharge Note   Patient Details  Name: Dustie Brittle MRN: [id number] Date of Birth: [birth year]  Transition of Care [facility]) CM/SW Contact:  Darleene Cleaver, LCSW Phone Number: 01/07/2021, 3:04 PM   Clinical Narrative:     CSW spoke to [facility] patient can return today.  Patient to be d/c'ed today to [facility] room 501B.  Patient and family agreeable to plans will transport via ems RN to call report to [phone].  Patient's daughter is aware that patient is discharging today.  Final next level of care: Skilled Nursing Facility Barriers to Discharge: Barriers Resolved   Patient Goals and CMS Choice Patient states their goals for this hospitalization and ongoing recovery are:: To return back to [facility] CMS Medicare.gov Compare Post Acute Care list provided to:: Patient Represenative (must comment) Choice offered to / list presented to : Adult Children  Discharge Placement   Existing PASRR number confirmed : 01/07/21          Patient [facility] bed at: [facility] Patient to be transferred to facility by: [facility] Name of family member notified: Patient's son  in law and daughter. Patient and family notified of of transfer: 01/07/21  Discharge Plan and Services  Patient going back to [facility] where she is a long term care resident.   Discharge Planning Services: CM Consult                                 Social Determinants of Health (SDOH) Interventions     Readmission Risk Interventions No flowsheet data found.

## 2021-01-07 NOTE — TOC Progression Note (Signed)
Transition of Care [facility]) - Progression Note    Patient Details  Name: Kendra Smith MRN: [id number] Date of Birth: Nov 03, 1926  Transition of Care [facility]) CM/SW Contact  Darleene Cleaver, [place] Phone Number: 01/07/2021, 1:13 PM  Clinical Narrative:     CSW was informed that patient is LTC at [facility].  CSW contacted [facility] and they said that patient's daughter has to complete paperwork before she can return.  CSW contacted patient's daughter left a message on voice mail.  CSW also spoke to patient's son in law and he was going to let patient's daughter know.  CSW notified [facility] to contact patient's daughter if they don't hear back from her.   Expected Discharge Plan: Skilled Nursing Facility Barriers to Discharge: Continued Medical Work up  Expected Discharge Plan and Services Expected Discharge Plan: Skilled Nursing Facility   Discharge Planning Services: CM Consult   Living arrangements for the past 2 months: Skilled Nursing Facility Expected Discharge Date: 01/07/21                                     Social Determinants of Health (SDOH) Interventions    Readmission Risk Interventions No flowsheet data found.

## 2021-01-07 NOTE — NC FL2 (Signed)
[place] MEDICAID FL2 LEVEL OF CARE SCREENING TOOL     IDENTIFICATION  Patient Name: Kendra Smith Birthdate: 12-12-1925 Sex: female Admission Date (Current Location): 01/03/2021  [facility] and [place] Number:  Producer, television/film/video and Address:  [facility],  [address], [place] [address]      Provider Number: [id number]  Attending Physician Name and Address:  Merlene Laughter, DO  Relative Name and Phone Number:  Long,Linda Daughter [phone]  [phone]    Current Level of Care: Hospital Recommended Level of Care: Skilled Nursing Facility Prior Approval Number:    Date Approved/Denied:   PASRR Number: [id number]  Discharge Plan: Home    Current Diagnoses: Patient Active Problem List   Diagnosis Date Noted   Pressure injury of skin 01/04/2021   Acute renal failure superimposed on stage 3a chronic kidney disease (HCC) 01/03/2021   Severe sepsis (HCC) 01/03/2021   Metabolic acidosis 01/03/2021   Acute metabolic encephalopathy 01/03/2021   Seizure (HCC) 05/28/2020   Weakness generalized    GI bleed 08/30/2019   UTI (urinary tract infection) 08/30/2019   Hypokalemia 08/30/2019   Demand ischemia (HCC) 08/30/2019   Palliative care by specialist    COVID-19    DNR (do not resuscitate)    Adult failure to thrive    Left acetabular fracture (HCC) 08/23/2019   Pelvic rami fracture (HCC) 08/23/2019   PAF (paroxysmal atrial fibrillation) (HCC) 08/23/2019   Rib fracture-12 01/12/2019   Fracture of transverse process of lumbar vertebra L1-L2 01/12/2019   Dementia (HCC) 01/12/2019   Fall 01/12/2019   Leukocytosis 01/12/2019   Depression    Hypertension     Orientation RESPIRATION BLADDER Height & Weight     Self,Place  Normal Incontinent Weight: 100 lb 15.5 oz (45.8 kg) Height:  5\' 3"  (160 cm)  BEHAVIORAL SYMPTOMS/MOOD NEUROLOGICAL BOWEL NUTRITION STATUS      Incontinent Diet (Dys 3)  AMBULATORY STATUS  COMMUNICATION OF NEEDS Skin   Limited Assist Verbally PU Stage and Appropriate Care   PU Stage 2 Dressing:  (PRN dressing)                   Personal Care Assistance Level of Assistance  Bathing,Feeding,Dressing Bathing Assistance: Limited assistance Feeding assistance: Limited assistance Dressing Assistance: Limited assistance     Functional Limitations Info  Sight,Speech,Hearing Sight Info: Adequate Hearing Info: Adequate Speech Info: Adequate    SPECIAL CARE FACTORS FREQUENCY  OT (By licensed OT),PT (By licensed PT)     PT Frequency: Minimum 5x a week OT Frequency: Minimum 5x a week            Contractures      Additional Factors Info  Code Status,Allergies Code Status Info: DNR Allergies Info: Ciprofloxacin Hcl   Effexor Gantrisin Levaquin Sulfa Antibiotics           Current Medications (01/07/2021):  This is the current hospital active medication list Current Facility-Administered Medications  Medication Dose Route Frequency Provider Last Rate Last Admin   0.9 %  sodium chloride infusion   Intravenous PRN 01/09/2021 Latif, DO   Stopped at 01/04/21 1435   acetaminophen (TYLENOL) tablet 650 mg  650 mg Oral Q6H PRN Opyd, 01/06/21, MD       Or   acetaminophen (TYLENOL) suppository 650 mg  650 mg Rectal Q6H PRN Opyd, Lavone Neri, MD       cefTRIAXone (ROCEPHIN) 1 g in sodium chloride 0.9 % 100 mL IVPB  1 g Intravenous  Q24H Briscoe Deutscher, MD 200 mL/hr at 01/07/21 5638 1 g at 01/07/21 9373   cholecalciferol (VITAMIN D3) tablet 1,000 Units  1,000 Units Oral Q breakfast Marguerita Merles [place], DO   1,000 Units at 01/07/21 0932   digoxin (LANOXIN) tablet 0.125 mg  0.125 mg Oral QODAY Sheikh, Omair [place], DO   0.125 mg at 01/07/21 0933   donepezil (ARICEPT) tablet 5 mg  5 mg Oral QHS Sheikh, Kateri Mc [place], DO   5 mg at 01/06/21 2013   DULoxetine (CYMBALTA) DR capsule 60 mg  60 mg Oral Q breakfast Marguerita Merles [place], DO   60 mg at 01/07/21 4287   Gerhardt's  butt cream   Topical BID Marguerita Merles [place], DO   1 application at 01/07/21 6811   HYDROcodone-acetaminophen (NORCO/VICODIN) 5-325 MG per tablet 1-2 tablet  1-2 tablet Oral Q6H PRN Marguerita Merles [place], DO   1 tablet at 01/06/21 1811   latanoprost (XALATAN) 0.005 % ophthalmic solution 1 drop  1 drop Both Eyes QHS Marguerita Merles [place], [place]   1 drop at 01/06/21 2027   levETIRAcetam (KEPPRA) tablet 500 mg  500 mg Oral BID Briscoe Deutscher, MD   500 mg at 01/07/21 5726   MEDLINE mouth rinse  15 mL Mouth Rinse BID Opyd, Lavone Neri, MD   15 mL at 01/06/21 2023   OLANZapine zydis (ZYPREXA) disintegrating tablet 5 mg  5 mg Oral QHS PRN Marguerita Merles Latif, DO   5 mg at 01/06/21 2011   ondansetron (ZOFRAN) tablet 4 mg  4 mg Oral Q6H PRN Opyd, Lavone Neri, MD       Or   ondansetron (ZOFRAN) injection 4 mg  4 mg Intravenous Q6H PRN Opyd, Lavone Neri, MD       pantoprazole (PROTONIX) injection 40 mg  40 mg Intravenous Q12H Sheikh, Kateri Mc [place], DO   40 mg at 01/07/21 0931   phenol (CHLORASEPTIC) mouth spray 1 spray  1 spray Mouth/Throat PRN Marland Mcalpine, Kateri Mc Latif, DO       saccharomyces boulardii (FLORASTOR) capsule 250 mg  250 mg Oral BID Sheikh, Omair Latif, DO   250 mg at 01/07/21 2035   senna-docusate (Senokot-S) tablet 1 tablet  1 tablet Oral QHS PRN Opyd, Lavone Neri, MD       sodium chloride flush (NS) 0.9 % injection 3 mL  3 mL Intravenous Q12H Opyd, Lavone Neri, MD   3 mL at 01/07/21 0934   timolol (TIMOPTIC) 0.5 % ophthalmic solution 1 drop  1 drop Both Eyes Daily Marguerita Merles [place], DO   1 drop at 01/07/21 0935   traZODone (DESYREL) tablet 25 mg  25 mg Oral QPM Marguerita Merles [place], DO   25 mg at 01/06/21 2012     Discharge Medications: Please see discharge summary for a list of discharge medications.  Relevant Imaging Results:  Relevant Lab Results:   Additional Information SSN [id number]  Darleene Cleaver, LCSW

## 2021-01-07 NOTE — Progress Notes (Signed)
Initial Nutrition Assessment  DOCUMENTATION CODES:   Underweight  INTERVENTION:   -Ensure Enlive po BID, each supplement provides 350 kcal and 20 grams of protein  -Magic cup BID with meals, each supplement provides 290 kcal and 9 grams of protein  NUTRITION DIAGNOSIS:   Increased nutrient needs related to acute illness,wound healing as evidenced by estimated needs.  GOAL:   Patient will meet greater than or equal to 90% of their needs  MONITOR:   PO intake,Supplement acceptance,Labs,Weight trends,I & O's,Skin  REASON FOR ASSESSMENT:   Consult Assessment of nutrition requirement/status,Poor PO  ASSESSMENT:   87 y.o. female with medical history significant for paroxysmal atrial fibrillation, seizures, chronic kidney disease 3 a, and dementia, now presenting to the emergency department for evaluation of decreased responsiveness and abnormal blood work.  Per report of SNF personnel, the patient has had decreased responsiveness and blood work was obtained demonstrating elevated BUN and creatinine.  Patient curled up in bed, pt would not waken or interact with RD during visit.  Per chart review, pt with dementia, a/o x 2. Pt has been consuming 25-75% of meals since admission.  Will order Ensure supplements and Magic cups with meals.  Per weight records, pt has lost 31 lbs since 05/29/20 (23% wt loss x 7 months, significant for time frame).  Medications: Vitamin D, Florastor   Labs reviewed.  NUTRITION - FOCUSED PHYSICAL EXAM:  Unable to complete, pt curled up in bed and would not interact with RD  Diet Order:   Diet Order            DIET DYS 3 Room service appropriate? Yes; Fluid consistency: Thin  Diet effective now                 EDUCATION NEEDS:   No education needs have been identified at this time  Skin:  Skin Assessment: Skin Integrity Issues: Skin Integrity Issues:: Stage II Stage II: right, left buttocks  Last BM:  2/27 -type 5  Height:   Ht  Readings from Last 1 Encounters:  01/03/21 5\' 3"  (1.6 m)    Weight:   Wt Readings from Last 1 Encounters:  01/04/21 45.8 kg   BMI:  Body mass index is 17.89 kg/m.  Estimated Nutritional Needs:   Kcal:  1400-1600  Protein:  65-80g  Fluid:  1.6L/day  01/06/21, MS, RD, LDN Inpatient Clinical Dietitian Contact information available via Amion

## 2021-01-07 NOTE — Care Management Important Message (Signed)
Medicare IM printed for Social work team at [facility] to give to the patient. °

## 2021-01-07 NOTE — Plan of Care (Signed)

## 2021-01-07 NOTE — Discharge Summary (Signed)
Physician Discharge Summary  Kendra Smith ZOX:[id number] DOB: Oct 29, 1926 DOA: 01/03/2021  PCP: Chilton Greathouse, MD  Admit date: 01/03/2021 Discharge date: 01/07/2021  Admitted From: SNF Disposition: SNF ([facility])  Recommendations for Outpatient Follow-up:  1. Follow up with PCP in 1-2 weeks 2. Please obtain CMP/CBC, Mag, Phos in one week 3. Please follow up on the following pending results:  Home Health: No Equipment/Devices: None  Discharge Condition: Stable  CODE STATUS: DO NOT RESUSCITATE Diet recommendation: DYSPHAGIA 3 Diet with THIN Liquids   Brief/Interim Summary: HPI per Dr. Odie Sera on 01/03/21 Kendra Smith a 87 y.o.femalewith medical history significant forparoxysmal atrial fibrillation, seizures, chronic kidney disease 3 a, and dementia, now presenting to the emergency department for evaluation of decreased responsiveness and abnormal blood work. Per report of SNF personnel, the patient has had decreased responsiveness and blood work was obtained demonstrating elevated BUN and creatinine. Patient is unable to provide history in the ED. Her family notes that at baseline, she is sometimes talkative but is confused, feeds herself, but requires assistance with all other activities and has been requiring a wheelchair.  ED Course:Upon arrival to the ED, patient is found to have a rectal temperature of 37.9, saturating upper 90s on room air, normal respirations, normal blood pressure, and tachycardia in the low 100s. EKG features ectopic atrial tachycardia with PVCs, LVH, and IVCD. Chest x-ray demonstrates mild cardiomegaly and mild hypoinflation. Noncontrast head CT is negative for acute intracranial abnormality. Chemistry panel notable for bicarbonate of 15, BUN 116, and creatinine 2.31. CBC demonstrates a leukocytosis to 21,900 and mild thrombocytosis. Troponin is normal. COVID-19 PCR is pending. Lactic acid is pending. Blood and urine cultures were  ordered and the patient was given 2 L of LR, vancomycin, cefepime, and Flagyl in the ED.  **Interim History Patient has been intermittently hypotensive and so she was given another 2 L bolus.  She still remains significantly confused and is only alert and oriented x1 but renal function is trending downwards along with the rest of her labs but her Blood Count dropped and initially thought to be dilutional but she is FOBT Positive so will change PPI to IV BID and consult with Gastroenterology for further evaluation. Patient is not actively bleeding and family does not want invasive GI workup so GI has signed off the case and will continue to Monitor carefully; Electrolytes are being replete today. Anticipating D/C'ing back to SNF in the AM as we will continue Abx today and tomorrow and repeat COVID testing.   Yesterday Nursing stated that she is having more diarrhea overnight but this is a small volume.  We will add probiotics in addition given that she has been taking antibiotics.  I do not clinically feel that this is C. difficile given that her white blood cell count has resolved and she is afebrile. She is improved today and stable to D/C to SNF as all her electrolytes have resolved. She is in NAD and appears calm and comfortable. Will need to follow up with PCP within 1-2 weeks as she is Medically Stable to D/C.   Discharge Diagnoses:  Principal Problem:   Acute renal failure superimposed on stage 3a chronic kidney disease (HCC) Active Problems:   Dementia (HCC)   PAF (paroxysmal atrial fibrillation) (HCC)   Seizure (HCC)   Severe sepsis (HCC)   Metabolic acidosis   Acute metabolic encephalopathy   Pressure injury of skin  Acute renal failure superimposed on CKD IIIa; improved Metabolic Acidosis, improved  -Presents from SNF  with decreased responsiveness and increased BUN and creatinine and is found to have BUN 116 and SCr 2.31, up from 11 and 1.04 in July -She appears hypovolemic and  was fluid-resuscitated in ED with 2 liters LR and still appears a little dry in the colon was hypotensive this morning with low maps so was given another 2 L normal saline bolus and she continued on the sodium bicarb drip with 150 mEq of sodium bicarb and at 100 MLS per hour and will reduce the rate to 75 MLS per hour and now Mainteance IVF now stopped -Discontinued IVF hydration with isotonic bicarbonate as above -BUN/creatinine is improved as it has gone from 116/2.31 -> 92/1.66 -> 42/0.77 -> 20/0.66 -> 11/0.69 -Her acidosis is improving as well as her CO2 was 15 and is now 33, anion gap is now 9, chloride level 98 -Avoid nephrotoxic medications, contrast dyes, hypotension and renally dose medications  -Continue monitor and trend renal function carefully and repeat CMP at SNF  Sepsis secondary to UTI -Presents from SNF due to decreased responsiveness and acute renal failure and is found to have tachycardia and leukocytosis with clear CXR, no respiratory sxs, benign abdominal exam, no meningismus or apparent cellulitis, and UA compatible with possible infection -Urinalysis showed a hazy appearance with moderate leukocytes, many bacteria, 0-5 RBCs per high-power field, 11-20 WBCs with urine culture showed Multiple Species Present as well as blood cultures x2 showing NGTD at 2 Days WBC trending down from 21.9 -> 16.2 -> 12.0 -> 9.4 -> 8.1 -Cultures are being collected in ED, lactate is pending, and she was fluid-resuscitated and started on broad-spectrum antibiotics and she is now on IV ceftriaxone but will add Florastor given her diarrhea -Follow-up lactate was 1.2, follow cultures,  -Continue Empriic Antibiotics with IV Ceftriaxonefor now despite Urine Cx stating Multiple Species Present  -Will complete 5 Days of Abx and today is Day 5/5   Hypotension, ? From Blood Loss vs Infection versus diarrhea -In the setting of above -Given additional 2 L bolus (total the day before yeseterday)and  continue monitor blood pressures per protocol -Was getting maintenance IV fluid but was reduced to 75 mL yesterday and now stopped  -Continue to Monitor BP per Protocol  -BP's have remained soft but now have improved and BP is 136/56 -Diarrhea is improved  -BP is improved as well   Acute Encephalopathy Superimposed on Chronic Dementia -Patient has dementia and per family she is often talkative but confused on baseline, is able to feed herself but requires assistance with all other activities  -Currently is only alert and oriented x1 and appears close to baseline or is at baseline - Per SNF personnel, she was less responsive pta; no acute findings on head CT, likely related to infection and/or uremia -This seems to be improving now -We will place on delirium precautions -Continue with Donepezil 5 mg p.o. nightly and Duloxetine 60 mg p.o. with breakfast -IV fluid resuscitation and antibiotics as above  Paroxysmal Atrial fibrillation, IN A Fib now and Rate controlled  -In and out of atrial fibrillation in ED; In Atrial Fib this AM with HR in the Mid 100's - Teens  -CHADS-VASc at least 66 (age x2, gender)  -We will resume her digoxin 0.125 mg p.o. every other day and check a digoxin level -Follow-up pharmacy medication reconciliation but does not appear to be anticoagulated -She currently remains in A. fib with slightly elevated tachycardia with heart rates in the mid 100s -Continue telemetry monitoring while hospitalized  Diarrhea, improved -Probably the cause of her abnormal electrolytes -Continues to be small volume -Question if this is antibiotic mediated -Diarrhea has nearly resolved   -We will consult nutrition for feeding supplementation and add Florastor probiotics given that she is taking antibiotics. -Continue to monitor diarrhea and if necessary we will add Lomotil versus Imodium  Acquired Thrombophilia -As above is not anticoagulated likely due to Fall  Risk  Glaucoma -Continue with Timolol and Latanoprost  GERD -C/w PPI  Seizures -Continue with Levetiracetam 500 mg p.o.  Hypokalemia -Mild. K+ was 3.3 and likely in the setting of diarrhea and now improved to 4.3 -Continue to Monitor and Replete as Necessary -Repeat CMP at SNF  Leukocytosis -Could have been from significant dehydration but also in the setting of Infection -Patient's WBC went from 21.9 -> 16.2 -> 12.0 -> 9.4 -> 8.1 -C/w Empiric Abx As above and today is the last day of Abx -Continue to Monitor for S/Sx of Infection -Repeat CBC at SNF  Hypophosphatemia -Patient's Phos Level is now 39 -Continue to Monitor and Replete as Necessary -Repeat Phos Level at SNF  Normocytic Anemia with Concern for Blood Loss -Likely was hemoconcentrated on admission had a dilutional drop as hemoglobin/hematocrit went from 11.0/36.8 -> 7.5/24.4 -> 7.7/25.4 -> 7.3/24.3 ->8.1/26.8 -Check FOBT and was POSITIVE; Will Discontinue Heparin 5,000 units sq and start PPI as above -Obtain anemia panel this AM;  -Will consult Gastroenterology for further evaluation and they have signed off the case given that the patient's daughter does not want the patient to undergo anesthesia or endoscopic intervention and wants to be called with blood transfusions are required. -GI has signed off the case given that there is no evidence of melena or hematochezia and they recommend conservative management and patient remains on a dysphagia 3 level diet as well as PPI every 12 and will changes to twice daily -Continue to monitor for signs and symptoms of bleeding; currently no overt bleeding noted -Repeat CBC at SNF  Underweight -Nutritionist consulted for further evaluation and recommendations -C/w Ensure Enlive po BID and Magic Cup BIDwm  GOC: DNR, poA  Discharge Instructions  Discharge Instructions    Call MD for:  difficulty breathing, headache or visual disturbances   Complete by: As  directed    Call MD for:  extreme fatigue   Complete by: As directed    Call MD for:  hives   Complete by: As directed    Call MD for:  persistant dizziness or light-headedness   Complete by: As directed    Call MD for:  persistant nausea and vomiting   Complete by: As directed    Call MD for:  redness, tenderness, or signs of infection (pain, swelling, redness, odor or green/yellow discharge around incision site)   Complete by: As directed    Call MD for:  severe uncontrolled pain   Complete by: As directed    Call MD for:  temperature >100.4   Complete by: As directed    Diet - low sodium heart healthy   Complete by: As directed    Discharge instructions   Complete by: As directed    You were cared for by a hospitalist during your hospital stay. If you have any questions about your discharge medications or the care you received while you were in the hospital after you are discharged, you can call the unit and ask to speak with the hospitalist on call if the hospitalist that took care of you is not available.  Once you are discharged, your primary care physician will handle any further medical issues. Please note that NO REFILLS for any discharge medications will be authorized once you are discharged, as it is imperative that you return to your primary care physician (or establish a relationship with a primary care physician if you do not have one) for your aftercare needs so that they can reassess your need for medications and monitor your lab values.  Follow up with PCP as an outpatient within 1-2 weeks. Take all medications as prescribed. If symptoms change or worsen please return to the ED for evaluation   Discharge wound care:   Complete by: As directed    Buttocks Right;Left Stage 2 -  Partial thickness loss of dermis presenting as a shallow open injury with a red, pink wound bed without slough; C/w Desitin and Gerhardt's Butt Cream   Increase activity slowly   Complete by: As  directed      Allergies as of 01/07/2021      Reactions   Ciprofloxacin Hcl Other (See Comments)   On MAR   Effexor [venlafaxine] Other (See Comments)   On MAR   Gantrisin [sulfisoxazole] Other (See Comments)   On MAR   Levaquin [levofloxacin] Other (See Comments)   On MAR   Sulfa Antibiotics Rash      Medication List    TAKE these medications   acetaminophen 500 MG tablet Commonly known as: TYLENOL Take 1,000 mg by mouth every 6 (six) hours as needed for mild pain, fever or headache.   calcitonin (salmon) 200 UNIT/ACT nasal spray Commonly known as: MIACALCIN/FORTICAL Place 1 spray into alternate nostrils daily.   calcium carbonate 1500 (600 Ca) MG Tabs tablet Commonly known as: OSCAL Take 600 mg of elemental calcium by mouth daily with breakfast.   cholecalciferol 25 MCG (1000 UNIT) tablet Commonly known as: VITAMIN D3 Take 1,000 Units by mouth daily with breakfast.   DERMACLOUD EX Apply 1 application topically See admin instructions. Dermacloud ointment- Apply to buttocks after each incontinent episode   Dermacloud Oint Apply 1 application topically in the morning and at bedtime.   Desitin 40 % Pste Generic drug: Zinc Oxide Apply 1 application topically in the morning and at bedtime.   digoxin 0.125 MG tablet Commonly known as: LANOXIN Take 0.125 mg by mouth every other day.   donepezil 5 MG tablet Commonly known as: ARICEPT Take 5 mg by mouth at bedtime.   DULoxetine 30 MG capsule Commonly known as: CYMBALTA Take 30 mg by mouth daily.   Gerhardt's butt cream Crea Apply 1 application topically 2 (two) times daily.   HYDROcodone-acetaminophen 5-325 MG tablet Commonly known as: NORCO/VICODIN Take 1-2 tablets by mouth every 6 (six) hours as needed. What changed:   how much to take  when to take this  reasons to take this  Another medication with the same name was removed. Continue taking this medication, and follow the directions you see here.    lactose free nutrition Liqd Take 237 mLs by mouth 2 (two) times daily between meals.   latanoprost 0.005 % ophthalmic solution Commonly known as: XALATAN Place 1 drop into both eyes at bedtime.   levETIRAcetam 500 MG tablet Commonly known as: KEPPRA Take 500 mg by mouth 2 (two) times daily.   NON FORMULARY Take 120 mLs by mouth See admin instructions. MedPass- Drink 120 ml's by mouth two times a day   ondansetron 4 MG tablet Commonly known as: ZOFRAN Take 1 tablet (4 mg  total) by mouth every 6 (six) hours as needed for nausea.   pantoprazole 40 MG tablet Commonly known as: PROTONIX Take 40 mg by mouth daily.   pregabalin 50 MG capsule Commonly known as: LYRICA Take 50 mg by mouth 2 (two) times daily.   saccharomyces boulardii 250 MG capsule Commonly known as: FLORASTOR Take 1 capsule (250 mg total) by mouth 2 (two) times daily.   timolol 0.5 % ophthalmic solution Commonly known as: TIMOPTIC Place 1 drop into both eyes daily.   traMADol 50 MG tablet Commonly known as: ULTRAM Take 1 tablet (50 mg total) by mouth every 8 (eight) hours as needed for severe pain.   traZODone 50 MG tablet Commonly known as: DESYREL Take 25 mg by mouth every evening.            Discharge Care Instructions  (From admission, onward)         Start     Ordered   01/07/21 0000  Discharge wound care:       Comments: Buttocks Right;Left Stage 2 -  Partial thickness loss of dermis presenting as a shallow open injury with a red, pink wound bed without slough; C/w Desitin and Gerhardt's Butt Cream   01/07/21 1225          Allergies  Allergen Reactions  . Ciprofloxacin Hcl Other (See Comments)    On MAR  . Effexor [Venlafaxine] Other (See Comments)    On MAR  . Gantrisin [Sulfisoxazole] Other (See Comments)    On MAR  . Levaquin [Levofloxacin] Other (See Comments)    On MAR  . Sulfa Antibiotics Rash    Consultations:  None  Procedures/Studies: CT Head Wo  Contrast  Result Date: 01/03/2021 CLINICAL DATA:  Altered level of consciousness, elevated BUN EXAM: CT HEAD WITHOUT CONTRAST TECHNIQUE: Contiguous axial images were obtained from the base of the skull through the vertex without intravenous contrast. COMPARISON:  07/30/2020 FINDINGS: Brain: Stable hypodensities in the periventricular white matter consistent with chronic small vessel ischemic change. No acute infarct or hemorrhage. Lateral ventricles and midline structures are unremarkable. No acute extra-axial fluid collections. No mass effect. Vascular: No hyperdense vessel or unexpected calcification. Skull: Normal. Negative for fracture or focal lesion. Sinuses/Orbits: No acute finding. Other: None. IMPRESSION: 1. Stable chronic small-vessel ischemic changes throughout the white matter. No acute intracranial process. Electronically Signed   By: Sharlet Salina M.D.   On: 01/03/2021 19:58   DG Chest Port 1 View  Result Date: 01/03/2021 CLINICAL DATA:  cough EXAM: PORTABLE CHEST 1 VIEW COMPARISON:  07/30/2020 and prior. FINDINGS: Mild hypoinflation. No pneumothorax or pleural effusion. No focal consolidation. Mild cardiomegaly. Chronic left humeral deformity. IMPRESSION: Mild hypoinflation.  No focal consolidation. Mild cardiomegaly. Electronically Signed   By: Stana Bunting M.D.   On: 01/03/2021 19:04    Subjective: Seen and examined at bedside she is resting comfortably and wanting to sleep.  Not in any acute distress.  No nausea or vomiting.  Nursing states that her diarrhea is improved from yesterday.  No other concerns and attempted to call the daughter [name] to update her about the plan of care about her being discharged but ended up ago.  She remains medically stable to be discharged and will be discharged later on today as she has a bed available.  No other concerns or plans at this time.  Discharge Exam: Vitals:   01/06/21 2044 01/07/21 0603  BP: (!) 152/70 (!) 136/56  Pulse: (!) 101  (!) 109  Resp: 20   Temp: 97.7 F (36.5 C) 98.7 F (37.1 C)  SpO2: 96% 94%   Vitals:   01/06/21 1225 01/06/21 1328 01/06/21 2044 01/07/21 0603  BP: (!) 149/66 (!) 102/51 (!) 152/70 (!) 136/56  Pulse: (!) 104 97 (!) 101 (!) 109  Resp: (!) Temp:  (!) 96.4 F (35.8 C) 97.7 F (36.5 C) 98.7 F (37.1 C)  TempSrc:  Axillary Oral Axillary  SpO2: 100% 93% 96% 94%  Weight:      Height:       General: Pt is a thin elderly Caucasian female who is pleasantly demented resting and wanting to sleep and in no acute distress  Cardiovascular: Irregularly irregular slightly tachycardic, S1/S2 +, no rubs, no gallops Respiratory: Mildly diminished bilaterally, no wheezing, no rhonchi; unlabored breathing Abdominal: Soft, NT, ND, bowel sounds + Extremities: no appreciable lower extremity edema, no cyanosis  The results of significant diagnostics from this hospitalization (including imaging, microbiology, ancillary and laboratory) are listed below for reference.    Microbiology: Recent Results (from the past 240 hour(s))  Urine Culture     Status: Abnormal   Collection Time: 01/03/21  6:51 PM   Specimen: Urine, Clean Catch  Result Value Ref Range Status   Specimen Description   Final    URINE, CLEAN CATCH Performed at [facility], [address] [place], [place] [address]    Special Requests   Final    NONE Performed at [facility], [address], [place], [place] [address]    Culture MULTIPLE SPECIES PRESENT, SUGGEST RECOLLECTION (A)  Final   Report Status 01/05/2021 FINAL  Final  Resp Panel by RT-PCR (Flu A&B, Covid) Nasopharyngeal Swab     Status: None   Collection Time: 01/03/21  7:00 PM   Specimen: Nasopharyngeal Swab; Nasopharyngeal(NP) swabs in vial transport medium  Result Value Ref Range Status   SARS Coronavirus 2 by RT PCR NEGATIVE NEGATIVE Final    Comment: (NOTE) SARS-CoV-2 target nucleic acids are NOT DETECTED.  The  SARS-CoV-2 RNA is generally detectable in upper respiratory specimens during the acute phase of infection. The lowest concentration of SARS-CoV-2 viral copies this assay can detect is 138 copies/mL. A negative result does not preclude SARS-Cov-2 infection and should not be used as the sole basis for treatment or other patient management decisions. A negative result may occur with  improper specimen collection/handling, submission of specimen other than nasopharyngeal swab, presence of viral mutation(s) within the areas targeted by this assay, and inadequate number of viral copies(<138 copies/mL). A negative result must be combined with clinical observations, patient history, and epidemiological information. The expected result is Negative.  Fact Sheet for Patients:  BloggerCourse.com  Fact Sheet for Healthcare Providers:  SeriousBroker.it  This test is no t yet approved or cleared by the [place] FDA and  has been authorized for detection and/or diagnosis of SARS-CoV-2 by FDA under an Emergency Use Authorization (EUA). This EUA will remain  in effect (meaning this test can be used) for the duration of the COVID-19 declaration under Section 564(b)(1) of the Act, 21 U.S.C.section 360bbb-3(b)(1), unless the authorization is terminated  or revoked sooner.       Influenza A by PCR NEGATIVE NEGATIVE Final   Influenza B by PCR NEGATIVE NEGATIVE Final    Comment: (NOTE) The Xpert Xpress SARS-CoV-2/FLU/RSV plus assay is intended as an aid in the diagnosis of influenza from Nasopharyngeal swab specimens and should not be  used as a sole basis for treatment. Nasal washings and aspirates are unacceptable for Xpert Xpress SARS-CoV-2/FLU/RSV testing.  Fact Sheet for Patients: BloggerCourse.com  Fact Sheet for Healthcare Providers: SeriousBroker.it  This test is not yet approved or  cleared by the [place] FDA and has been authorized for detection and/or diagnosis of SARS-CoV-2 by FDA under an Emergency Use Authorization (EUA). This EUA will remain in effect (meaning this test can be used) for the duration of the COVID-19 declaration under Section 564(b)(1) of the Act, 21 U.S.C. section 360bbb-3(b)(1), unless the authorization is terminated or revoked.  Performed at [facility], [address] [place], [place] [address]   Culture, blood (Routine X 2) w Reflex to ID Panel     Status: None (Preliminary result)   Collection Time: 01/03/21  7:57 PM   Specimen: BLOOD  Result Value Ref Range Status   Specimen Description   Final    BLOOD LEFT ANTECUBITAL Performed at [facility], [address] [place], [place] [address]    Special Requests   Final    BOTTLES DRAWN AEROBIC AND ANAEROBIC Blood Culture results may not be optimal due to an excessive volume of blood received in culture bottles Performed at [facility], [address] [place], [place] [address]    Culture   Final    NO GROWTH 2 DAYS Performed at [facility], [address], [place], [place] [address]    Report Status PENDING  Incomplete  Culture, blood (Routine X 2) w Reflex to ID Panel     Status: None (Preliminary result)   Collection Time: 01/03/21  8:15 PM   Specimen: BLOOD RIGHT HAND  Result Value Ref Range Status   Specimen Description   Final    BLOOD RIGHT HAND Performed at [facility], [address], [place], [place] [address]    Special Requests   Final    BOTTLES DRAWN AEROBIC AND ANAEROBIC Blood Culture adequate volume Performed at [facility], [address] [place], [place] [address]    Culture   Final    NO GROWTH 2 DAYS Performed at [facility], [address] [place], [place] [address]    Report Status PENDING  Incomplete  MRSA PCR Screening     Status:  None   Collection Time: 01/04/21  2:52 AM   Specimen: Nasopharyngeal  Result Value Ref Range Status   MRSA by PCR NEGATIVE NEGATIVE Final    Comment:        The GeneXpert MRSA Assay (FDA approved for NASAL specimens only), is one component of a comprehensive MRSA colonization surveillance program. It is not intended to diagnose MRSA infection nor to guide or monitor treatment for MRSA infections. Performed at [facility], [address] [place], [place] [address]   SARS CORONAVIRUS 2 (TAT 6-24 HRS) Nasopharyngeal Nasopharyngeal Swab     Status: None   Collection Time: 01/06/21  9:20 AM   Specimen: Nasopharyngeal Swab  Result Value Ref Range Status   SARS Coronavirus 2 NEGATIVE NEGATIVE Final    Comment: (NOTE) SARS-CoV-2 target nucleic acids are NOT DETECTED.  The SARS-CoV-2 RNA is generally detectable in upper and lower respiratory specimens during the acute phase of infection. Negative results do not preclude SARS-CoV-2 infection, do not rule out co-infections with other pathogens, and should not be used as the sole basis for treatment or other patient management decisions. Negative results must  be combined with clinical observations, patient history, and epidemiological information. The expected result is Negative.  Fact Sheet for Patients: HairSlick.no  Fact Sheet for Healthcare Providers: quierodirigir.com  This test is not yet approved or cleared by the [place] FDA and  has been authorized for detection and/or diagnosis of SARS-CoV-2 by FDA under an Emergency Use Authorization (EUA). This EUA will remain  in effect (meaning this test can be used) for the duration of the COVID-19 declaration under Se ction 564(b)(1) of the Act, 21 U.S.C. section 360bbb-3(b)(1), unless the authorization is terminated or revoked sooner.  Performed at [facility], [address] [place],  [place] [address]     Labs: BNP (last 3 results) No results for input(s): BNP in the last 8760 hours. Basic Metabolic Panel: Recent Labs  Lab 01/03/21 1858 01/04/21 0258 01/04/21 0306 01/05/21 0215 01/06/21 0313 01/07/21 0439  NA 137 138  --  136 138 140  K 3.6 3.0*  --  3.3* 3.3* 4.3  CL 107 105  --  98 96* 98  CO2 15* 20*  --  29 31 33*  GLUCOSE 137* 104*  --  86 96 105*  BUN 116* 92*  --  42* 20 11  CREATININE 2.31* 1.66*  --  0.77 0.66 0.69  CALCIUM 9.6 8.7*  --  8.2* 8.1* 8.5*  MG  --   --  1.6* 1.9 1.7 2.1  PHOS  --   --  3.0 1.1* 2.1* 3.9   Liver Function Tests: Recent Labs  Lab 01/03/21 1858 01/04/21 0306 01/05/21 0215 01/06/21 0313 01/07/21 0439  AST 13* 12* 14* 14* 12*  ALT 11 9 8 8 8   ALKPHOS 82 56 54 56 54  BILITOT 0.6 0.7 0.5 0.4 0.3  PROT 8.5* 6.4* 5.8* 5.6* 6.1*  ALBUMIN 3.5 3.3* 2.7* 2.6* 2.7*   No results for input(s): LIPASE, AMYLASE in the last 168 hours. No results for input(s): AMMONIA in the last 168 hours. CBC: Recent Labs  Lab 01/03/21 1858 01/04/21 0258 01/05/21 0215 01/06/21 0313 01/07/21 0439  WBC 21.9* 16.3* 12.0* 9.4 8.1  NEUTROABS 17.8*  --  8.9* 5.9 4.9  HGB 11.0* 7.5* 7.7* 7.3* 8.1*  HCT 36.8 24.4* 25.4* 24.3* 26.8*  MCV 83.6 83.6 83.3 84.7 84.5  PLT 412* 302 267 255 303   Cardiac Enzymes: No results for input(s): CKTOTAL, CKMB, CKMBINDEX, TROPONINI in the last 168 hours. BNP: Invalid input(s): POCBNP CBG: No results for input(s): GLUCAP in the last 168 hours. D-Dimer No results for input(s): DDIMER in the last 72 hours. Hgb A1c No results for input(s): HGBA1C in the last 72 hours. Lipid Profile No results for input(s): CHOL, HDL, LDLCALC, TRIG, CHOLHDL, LDLDIRECT in the last 72 hours. Thyroid function studies No results for input(s): TSH, T4TOTAL, T3FREE, THYROIDAB in the last 72 hours.  Invalid input(s): FREET3 Anemia work up No results for input(s): VITAMINB12, FOLATE, FERRITIN, TIBC, IRON, RETICCTPCT in the last 72  hours. Urinalysis    Component Value Date/Time   COLORURINE YELLOW 01/03/2021 1851   APPEARANCEUR HAZY (A) 01/03/2021 1851   LABSPEC 1.020 01/03/2021 1851   PHURINE 5.0 01/03/2021 1851   GLUCOSEU NEGATIVE 01/03/2021 1851   HGBUR NEGATIVE 01/03/2021 1851   BILIRUBINUR NEGATIVE 01/03/2021 1851   KETONESUR NEGATIVE 01/03/2021 1851   PROTEINUR 30 (A) 01/03/2021 1851   UROBILINOGEN 1.0 06/22/2011 1224   NITRITE NEGATIVE 01/03/2021 1851   LEUKOCYTESUR MODERATE (A) 01/03/2021 1851   Sepsis Labs Invalid input(s): PROCALCITONIN,  WBC,  LACTICIDVEN Microbiology Recent Results (from the past 240 hour(s))  Urine Culture     Status: Abnormal   Collection Time: 01/03/21  6:51 PM   Specimen: Urine, Clean Catch  Result Value Ref Range Status   Specimen Description   Final    URINE, CLEAN CATCH Performed at [facility], [address], [place], [place] [address]    Special Requests   Final    NONE Performed at [facility], [address], [place], [place] [address]    Culture MULTIPLE SPECIES PRESENT, SUGGEST RECOLLECTION (A)  Final   Report Status 01/05/2021 FINAL  Final  Resp Panel by RT-PCR (Flu A&B, Covid) Nasopharyngeal Swab     Status: None   Collection Time: 01/03/21  7:00 PM   Specimen: Nasopharyngeal Swab; Nasopharyngeal(NP) swabs in vial transport medium  Result Value Ref Range Status   SARS Coronavirus 2 by RT PCR NEGATIVE NEGATIVE Final    Comment: (NOTE) SARS-CoV-2 target nucleic acids are NOT DETECTED.  The SARS-CoV-2 RNA is generally detectable in upper respiratory specimens during the acute phase of infection. The lowest concentration of SARS-CoV-2 viral copies this assay can detect is 138 copies/mL. A negative result does not preclude SARS-Cov-2 infection and should not be used as the sole basis for treatment or other patient management decisions. A negative result may occur with  improper specimen collection/handling,  submission of specimen other than nasopharyngeal swab, presence of viral mutation(s) within the areas targeted by this assay, and inadequate number of viral copies(<138 copies/mL). A negative result must be combined with clinical observations, patient history, and epidemiological information. The expected result is Negative.  Fact Sheet for Patients:  BloggerCourse.com  Fact Sheet for Healthcare Providers:  SeriousBroker.it  This test is no t yet approved or cleared by the [place] FDA and  has been authorized for detection and/or diagnosis of SARS-CoV-2 by FDA under an Emergency Use Authorization (EUA). This EUA will remain  in effect (meaning this test can be used) for the duration of the COVID-19 declaration under Section 564(b)(1) of the Act, 21 U.S.C.section 360bbb-3(b)(1), unless the authorization is terminated  or revoked sooner.       Influenza A by PCR NEGATIVE NEGATIVE Final   Influenza B by PCR NEGATIVE NEGATIVE Final    Comment: (NOTE) The Xpert Xpress SARS-CoV-2/FLU/RSV plus assay is intended as an aid in the diagnosis of influenza from Nasopharyngeal swab specimens and should not be used as a sole basis for treatment. Nasal washings and aspirates are unacceptable for Xpert Xpress SARS-CoV-2/FLU/RSV testing.  Fact Sheet for Patients: BloggerCourse.com  Fact Sheet for Healthcare Providers: SeriousBroker.it  This test is not yet approved or cleared by the [place] FDA and has been authorized for detection and/or diagnosis of SARS-CoV-2 by FDA under an Emergency Use Authorization (EUA). This EUA will remain in effect (meaning this test can be used) for the duration of the COVID-19 declaration under Section 564(b)(1) of the Act, 21 U.S.C. section 360bbb-3(b)(1), unless the authorization is terminated or revoked.  Performed at [facility], [address], [place], [place] [address]   Culture, blood (Routine X 2) w Reflex to ID Panel     Status: None (Preliminary result)   Collection Time: 01/03/21  7:57 PM   Specimen: BLOOD  Result Value Ref Range Status   Specimen Description   Final    BLOOD LEFT ANTECUBITAL Performed at [facility], [address] [place], [place]  [address]    Special Requests   Final    BOTTLES DRAWN AEROBIC AND ANAEROBIC Blood Culture results may not be optimal due to an excessive volume of blood received in culture bottles Performed at [facility], [address], [place], [place] [address]    Culture   Final    NO GROWTH 2 DAYS Performed at [facility], [address] [place], [place] [address]    Report Status PENDING  Incomplete  Culture, blood (Routine X 2) w Reflex to ID Panel     Status: None (Preliminary result)   Collection Time: 01/03/21  8:15 PM   Specimen: BLOOD RIGHT HAND  Result Value Ref Range Status   Specimen Description   Final    BLOOD RIGHT HAND Performed at [facility], [address], [place], [place] [address]    Special Requests   Final    BOTTLES DRAWN AEROBIC AND ANAEROBIC Blood Culture adequate volume Performed at [facility], [address] [place], [place] [address]    Culture   Final    NO GROWTH 2 DAYS Performed at [facility], [address], [place], [place] [address]    Report Status PENDING  Incomplete  MRSA PCR Screening     Status: None   Collection Time: 01/04/21  2:52 AM   Specimen: Nasopharyngeal  Result Value Ref Range Status   MRSA by PCR NEGATIVE NEGATIVE Final    Comment:        The GeneXpert MRSA Assay (FDA approved for NASAL specimens only), is one component of a comprehensive MRSA colonization surveillance program. It is not intended to diagnose MRSA infection nor to guide or monitor treatment for MRSA infections. Performed at  [facility], [address], [place], [place] [address]   SARS CORONAVIRUS 2 (TAT 6-24 HRS) Nasopharyngeal Nasopharyngeal Swab     Status: None   Collection Time: 01/06/21  9:20 AM   Specimen: Nasopharyngeal Swab  Result Value Ref Range Status   SARS Coronavirus 2 NEGATIVE NEGATIVE Final    Comment: (NOTE) SARS-CoV-2 target nucleic acids are NOT DETECTED.  The SARS-CoV-2 RNA is generally detectable in upper and lower respiratory specimens during the acute phase of infection. Negative results do not preclude SARS-CoV-2 infection, do not rule out co-infections with other pathogens, and should not be used as the sole basis for treatment or other patient management decisions. Negative results must be combined with clinical observations, patient history, and epidemiological information. The expected result is Negative.  Fact Sheet for Patients: HairSlick.no  Fact Sheet for Healthcare Providers: quierodirigir.com  This test is not yet approved or cleared by the [place] FDA and  has been authorized for detection and/or diagnosis of SARS-CoV-2 by FDA under an Emergency Use Authorization (EUA). This EUA will remain  in effect (meaning this test can be used) for the duration of the COVID-19 declaration under Se ction 564(b)(1) of the Act, 21 U.S.C. section 360bbb-3(b)(1), unless the authorization is terminated or revoked sooner.  Performed at [facility], [address], [place], [place] [address]    Time coordinating discharge: 35 minutes  SIGNED:  Merlene Laughter, DO [facility] 01/07/2021, 12:25 PM Pager is on AMION  If 7PM-7AM, please contact night-coverage [email or web address]

## 2021-01-07 NOTE — Discharge Instructions (Signed)
Delirium °Delirium is a state of mental confusion. It comes on quickly and causes significant changes in a person's thinking and behavior. °People with delirium usually have trouble paying attention to what is going on or knowing where they are. They may become very withdrawn or very emotional and unable to sit still. They may even see or feel things that are not there (hallucinations). Delirium is a sign of a serious underlying medical condition. °What are the causes? °Delirium occurs when something suddenly affects the signals that the brain sends out. Brain signals can be affected by anything that puts severe stress on the body and brain and causes brain chemicals to be out of balance. The most common causes of delirium include: °· Infections. These may be bacterial, viral, fungal, or protozoal. °· Medicines. These include many over-the-counter and prescription medicines. °· Recreational drugs. °· Substance withdrawal. This occurs with sudden discontinuation of alcohol, certain medicines, or recreational drugs. °· Surgery and anesthesia. °· Sudden vascular events, such as stroke and brain hemorrhage. °· Other brain disorders, such as migraines, tumors, seizures, and physical head trauma. °· Metabolic disorders, such as kidney or liver failure. °· Low blood oxygen (anoxia). This may occur with lung disease, cardiac arrest, or carbon monoxide poisoning. °· Hormone imbalances (endocrinopathies), such as an overactive thyroid (hyperthyroidism) or underactive thyroid (hypothyroidism). °· Vitamin deficiencies. °What increases the risk? °The following factors may make someone more likely to develop this condition: °· Being a child. °· Being an older person. °· Living alone. °· Having vision loss or hearing loss. °· Having an existing brain disease, such as dementia. °· Having long-lasting (chronic) medical conditions, such as heart disease. °· Being hospitalized for long periods of time. °What are the signs or  symptoms? °Delirium starts with a sudden change in a person's thinking or behavior. Symptoms include: °· Not being able to stay awake (drowsiness) or pay attention. °· Being confused about places, time, and people. °· Forgetfulness. °· Having extreme energy levels. These may be low or high. °· Changes in sleep patterns. °· Extreme mood swings, such as sudden anger or anxiety. °· Focusing on things or ideas that are not important. °· Rambling and senseless talking. °· Difficulty speaking, understanding speech, or both. °· Hallucinations. °· Tremor or unsteady gait. °Symptoms come and go throughout the day and are often worse at the end of the day. °How is this diagnosed? °People with delirium may not realize that they have the condition. Often, a family member or health care provider is the first person to notice the changes. This condition may be diagnosed based on a physical exam, health history, and tests. °· The health care provider will obtain a detailed history. This may include questions about: °? Current symptoms. °? Medical conditions that you have. °? Medicines. °? Drug use. °· The health care provider will perform a mental status test by: °? Asking questions to check for confusion. °? Watching for abnormal behavior. °· The health care provider may also order lab tests or additional studies to determine the cause of the delirium. °How is this treated? °Treatment of delirium depends on the cause and severity. Delirium usually goes away within days or weeks of treating the underlying cause. In the meantime, do not leave the person alone because he or she may accidentally cause self-harm. This condition may be treated with supportive care, such as: °· Increased light during the day and decreased light at night. °· Low noise level. °· Uninterrupted sleep. °· A regular daily   schedule. °· Clocks and calendars to help with orientation. °· Familiar objects, including the person's pictures and clothing. °· Frequent  visits from familiar family and friends. °· A healthy diet. °· Gentle exercise. °In more severe cases of delirium, medicine may be prescribed to help the person keep calm and think more clearly.   °Follow these instructions at home: °· Continue supportive care as told by a health care provider. °· Take over-the-counter and prescription medicines only as told by your health care provider. °· Ask a health care provider before using herbs or supplements. °· Do not use alcohol or illegal drugs. °· Keep all follow-up visits. This is important.   °Contact a health care provider if: °· Symptoms do not get better or they become worse. °· New symptoms of delirium develop. °· Caring for the person at home does not seem safe. °· Eating, drinking, or communicating stops. °· There are side effects of medicines, such as changes in sleep patterns, dizziness, weight gain, restlessness, movement changes, or tremors. °Get help right away if: °· The person has thoughts of harming self or harming others. °· There are serious side effects of medicine, such as: °? Swelling of the face, lips, tongue, or throat. °? Fever, confusion, muscle spasms, or seizures. °If you ever feel like a loved one may hurt himself or herself or others, or shares thoughts about taking his or her own life, get help right away. You can go to your nearest emergency department or: °· Call your local emergency services (911 in the [place] °· Call a suicide crisis helpline, such as the National Suicide Prevention Lifeline at 1-800-273-8255. This is open 24 hours a day in the [place] °· Text the Crisis Text Line at 741741 (in the [place] °Summary °· Delirium is a state of mental confusion. It comes on quickly and causes significant changes in a person's thinking and behavior. °· Delirium is a sign of a serious underlying medical condition. °· Certain medical conditions or a long hospital stay may increase the risk of developing delirium. °· Treatment of delirium involves  treating the underlying cause and providing supportive treatments, such as a calm and familiar environment. °This information is not intended to replace advice given to you by your health care provider. Make sure you discuss any questions you have with your health care provider. °Document Revised: 02/03/2020 Document Reviewed: 02/03/2020 °Elsevier Patient Education © 2021 [employer] ° °

## 2021-01-08 DIAGNOSIS — G4089 Other seizures: Secondary | ICD-10-CM | POA: Diagnosis not present

## 2021-01-08 DIAGNOSIS — M810 Age-related osteoporosis without current pathological fracture: Secondary | ICD-10-CM | POA: Diagnosis not present

## 2021-01-08 DIAGNOSIS — N390 Urinary tract infection, site not specified: Secondary | ICD-10-CM | POA: Diagnosis not present

## 2021-01-08 DIAGNOSIS — I10 Essential (primary) hypertension: Secondary | ICD-10-CM | POA: Diagnosis not present

## 2021-01-08 DIAGNOSIS — F329 Major depressive disorder, single episode, unspecified: Secondary | ICD-10-CM | POA: Diagnosis not present

## 2021-01-08 DIAGNOSIS — K219 Gastro-esophageal reflux disease without esophagitis: Secondary | ICD-10-CM | POA: Diagnosis not present

## 2021-01-08 DIAGNOSIS — D508 Other iron deficiency anemias: Secondary | ICD-10-CM | POA: Diagnosis not present

## 2021-01-08 DIAGNOSIS — I480 Paroxysmal atrial fibrillation: Secondary | ICD-10-CM | POA: Diagnosis not present

## 2021-01-08 DIAGNOSIS — R251 Tremor, unspecified: Secondary | ICD-10-CM | POA: Diagnosis not present

## 2021-01-08 DIAGNOSIS — F0390 Unspecified dementia without behavioral disturbance: Secondary | ICD-10-CM | POA: Diagnosis not present

## 2021-01-08 DIAGNOSIS — G40901 Epilepsy, unspecified, not intractable, with status epilepticus: Secondary | ICD-10-CM | POA: Diagnosis not present

## 2021-01-09 DIAGNOSIS — D500 Iron deficiency anemia secondary to blood loss (chronic): Secondary | ICD-10-CM | POA: Diagnosis not present

## 2021-01-09 DIAGNOSIS — I480 Paroxysmal atrial fibrillation: Secondary | ICD-10-CM | POA: Diagnosis not present

## 2021-01-09 DIAGNOSIS — R197 Diarrhea, unspecified: Secondary | ICD-10-CM | POA: Diagnosis not present

## 2021-01-09 DIAGNOSIS — D649 Anemia, unspecified: Secondary | ICD-10-CM | POA: Diagnosis not present

## 2021-01-09 DIAGNOSIS — G9340 Encephalopathy, unspecified: Secondary | ICD-10-CM | POA: Diagnosis not present

## 2021-01-09 DIAGNOSIS — N289 Disorder of kidney and ureter, unspecified: Secondary | ICD-10-CM | POA: Diagnosis not present

## 2021-01-09 DIAGNOSIS — N390 Urinary tract infection, site not specified: Secondary | ICD-10-CM | POA: Diagnosis not present

## 2021-01-09 DIAGNOSIS — F0390 Unspecified dementia without behavioral disturbance: Secondary | ICD-10-CM | POA: Diagnosis not present

## 2021-01-09 LAB — CULTURE, BLOOD (ROUTINE X 2)
Culture: NO GROWTH
Culture: NO GROWTH
Special Requests: ADEQUATE

## 2021-01-10 DIAGNOSIS — L89322 Pressure ulcer of left buttock, stage 2: Secondary | ICD-10-CM | POA: Diagnosis not present

## 2021-01-15 ENCOUNTER — Other Ambulatory Visit: Payer: Self-pay | Admitting: *Deleted

## 2021-01-15 DIAGNOSIS — I480 Paroxysmal atrial fibrillation: Secondary | ICD-10-CM | POA: Diagnosis not present

## 2021-01-15 DIAGNOSIS — F329 Major depressive disorder, single episode, unspecified: Secondary | ICD-10-CM | POA: Diagnosis not present

## 2021-01-15 DIAGNOSIS — F0390 Unspecified dementia without behavioral disturbance: Secondary | ICD-10-CM | POA: Diagnosis not present

## 2021-01-15 DIAGNOSIS — K219 Gastro-esophageal reflux disease without esophagitis: Secondary | ICD-10-CM | POA: Diagnosis not present

## 2021-01-15 DIAGNOSIS — R251 Tremor, unspecified: Secondary | ICD-10-CM | POA: Diagnosis not present

## 2021-01-15 NOTE — Patient Outreach (Signed)
Member screened for potential [facility] Care Management needs.  Mrs. Kohlman is receiving skilled therapy at [facility]. Spoke with SNF SW who confirms member is a long term resident of [facility].   No identifiable [facility] Care Management needs at this time.    Raiford Noble, MSN, RN,BSN [facility] Post Acute Care Coordinator [phone] [facility]) [phone]  (Toll free office)

## 2021-01-20 DIAGNOSIS — F0390 Unspecified dementia without behavioral disturbance: Secondary | ICD-10-CM | POA: Diagnosis not present

## 2021-01-20 DIAGNOSIS — I480 Paroxysmal atrial fibrillation: Secondary | ICD-10-CM | POA: Diagnosis not present

## 2021-01-20 DIAGNOSIS — F329 Major depressive disorder, single episode, unspecified: Secondary | ICD-10-CM | POA: Diagnosis not present

## 2021-01-20 DIAGNOSIS — G40901 Epilepsy, unspecified, not intractable, with status epilepticus: Secondary | ICD-10-CM | POA: Diagnosis not present

## 2021-01-20 DIAGNOSIS — G4089 Other seizures: Secondary | ICD-10-CM | POA: Diagnosis not present

## 2021-01-20 DIAGNOSIS — R251 Tremor, unspecified: Secondary | ICD-10-CM | POA: Diagnosis not present

## 2021-01-20 DIAGNOSIS — K219 Gastro-esophageal reflux disease without esophagitis: Secondary | ICD-10-CM | POA: Diagnosis not present

## 2021-01-20 DIAGNOSIS — H409 Unspecified glaucoma: Secondary | ICD-10-CM | POA: Diagnosis not present

## 2021-01-24 ENCOUNTER — Non-Acute Institutional Stay: Payer: Self-pay | Admitting: Nurse Practitioner

## 2021-01-24 ENCOUNTER — Other Ambulatory Visit: Payer: Self-pay

## 2021-01-24 DIAGNOSIS — F0390 Unspecified dementia without behavioral disturbance: Secondary | ICD-10-CM

## 2021-01-24 DIAGNOSIS — Z515 Encounter for palliative care: Secondary | ICD-10-CM

## 2021-01-24 IMAGING — DX DG CHEST 1V PORT
1 series · 1 of 1 positions shown · non-contrast
Comparison: 08/30/2019, 08/23/2019, 06/29/2014 chest CT 01/12/2019

CLINICAL DATA: Code stroke altered mental status

EXAM:
PORTABLE CHEST 1 VIEW

[chest ap]
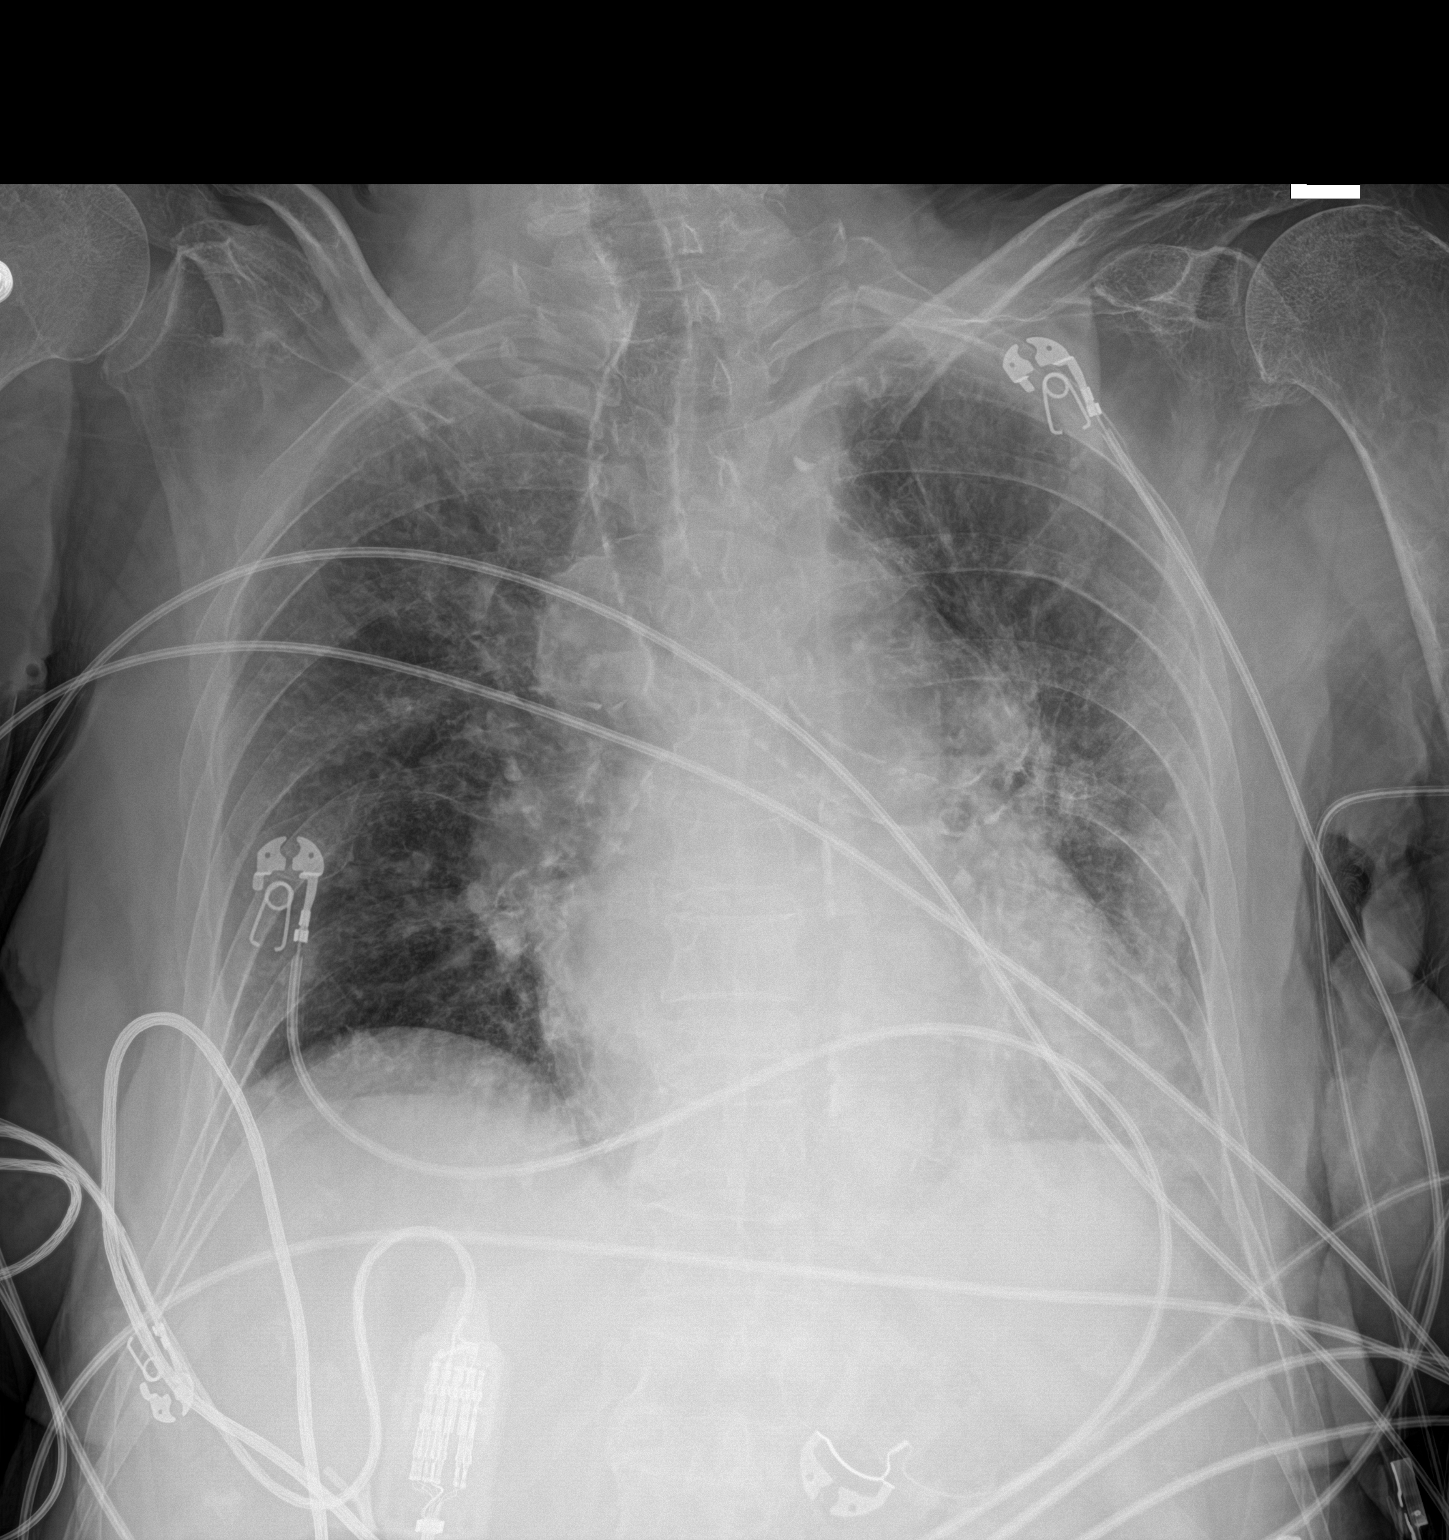

[1 of 1 positions shown; findings below may reference images not displayed]

FINDINGS: Mildly diminished lung volumes. Probable skin fold artifact over the
left lower chest. Cardiomegaly. Minimal atelectasis or scar at the
medial left base. Aortic atherosclerosis. No pneumothorax. Slightly
irregular left hilar opacity.
IMPRESSION: Low lung volumes with atelectasis or scar at the medial left base.

Slightly irregular left hilar opacity suspect that this is related
to rotation and hilar vessels though recommend short interval
two-view chest follow-up to exclude hilar distortion.

## 2021-01-24 NOTE — Progress Notes (Incomplete)
[facility] Consult Note Telephone: [phone]  Fax: [phone]  PATIENT NAME: Kendra Smith [address] [place] [address] [phone] (home)  DOB: [birth year] MRN: [id number]  PRIMARY CARE PROVIDER:    Prince Solian, MD,  [place] [address] [phone]  REFERRING PROVIDER:   Prince Solian, [place],  [place] [address] [phone]  RESPONSIBLE PARTY:   Extended Emergency Contact Information Primary Emergency Contact: Long,Linda Address: [address] [place], [place] [address] Johnnette Litter of [place] Phone: [phone] Mobile Phone: [phone] Relation: Daughter Secondary Emergency Contact: [place] of [place] Phone: [phone] Mobile Phone: [phone] Relation: Relative  I met face to face with patient and daughter Kendra Smith in facility. Patient present but unable to substantively engage in process.   ASSESSMENT AND RECOMMENDATIONS:   Advance Care Planning: Today's visit consisted of building trust and discussions on Palliative care medicine as a specialized medical care for people living with serious illness, aimed at facilitating improved quality of life through symptoms relief, assisting with advance care planning and establishing goals of care. Daughter expressed appreciation for education provided on Palliative care and how it differs from Hospice service. Palliative care will continue to provide support to patient, family and the medical team. Goal of care: Patient's goal of care is comfort, daughter verbalized desire to not do anything that could prolong patient's  suffering. Directives: Patient has a signed DNR form on file in the facility, copy on [facility] EMR. The need to complete a MOST form was discussed, patient and daughter expressed interest in completing a MOST form. Discussed and reviewed sections of  the form in detail, opportunity for questions given, all questions answered. Form completed and signed with daughter, form left with facility medical record office. Copy of MOST form uploaded to [facility] EMR. Details of MOST include comfort measures, determine use or limitation of antibiotics, IV fluids for a defined trial period, no feeding tube.  Unable to feed self, mechanical soft. Request that she be fed.  And sometime to drink in between.  Weight stable.  Cant's sit up unassisted. Directives: need for MOST discussed form complteted.    Symptom Management:  Was a resident here for over year. Was ambulatroy with wheel chair and participate in activities before than. Does not tlak very much.  steady decline with her dementia. Has Glaucoma. Talked about doease trajectory of dementia. Hx of hip fracture from fll.  Family don't want her get in cycle of going to hospital getting fluid  And repeating cycles. Follow up Palliative Care Visit: Palliative care will continue to follow for complex decision making and symptom management. Return *** weeks or prn. Clonazepam for tremors. 91, 94%  Family /Caregiver/Community Supports:   Cognitive / Functional decline:   I spent 60 minutes providing this consultation, time includes time spent with patient/family, chart review, provider coordination, and documentation. More than 50% of the time in this consultation was spent counseling and coordinating communication.   CHIEF COMPLAINT: Weakness  History obtained from review of EMR, discussion with facility staff, and interview with family. Records reviewed and summarized bellow.  HISTORY OF PRESENT ILLNESS:  Kendra Smith is a 87 y.o. year old female with multiple medical problems including Dementia ( paroxysymal Afib, seizures, CKD 3a,  Palliative Care was asked to follow this patient by consultation request of Avva, Ravisankar,  MD to help address advance care planning and goals of care.  This is a *** visit.  CODE STATUS: DNR  PPS: 30%  HOSPICE ELIGIBILITY/DIAGNOSIS: TBD  ROS limited ROS due to poor cognition, patient denied pain Other 10 point ROS reviewed daughter and -ve  Physical Exam: Current and past weights:  General: frail appearing, thin, lying in bed in NAD EYES: anicteric sclera, lids intact, no discharge  ENMT: intact hearing,oral mucous membranes dry CV:  no LE edema Pulmonary: no increased work of breathing, no cough, no audible wheezes, room air Abdomen:  no ascites GU: deferred MSK: severe sarcopenia, decreased ROM in all extremities, no contractures of LE, non ambulatory Skin: warm and dry, no rashes or wounds on visible skin Neuro: Generalized weakness, severe cognitive impairment Psych: non-anxious affect today, A and O x 1 Hem/lymph/immuno: no widespread bruising   PAST MEDICAL HISTORY:  Past Medical History:  Diagnosis Date  . Depression   . Glaucoma   . Hyperlipidemia   . Hypertension   . Osteoporosis     SOCIAL HX:  Social History   Tobacco Use  . Smoking status: Never Smoker  . Smokeless tobacco: Never Used  Substance Use Topics  . Alcohol use: No   FAMILY HX: No family history on file.  ALLERGIES:  Allergies  Allergen Reactions  . Ciprofloxacin Hcl Other (See Comments)    On MAR  . Effexor [Venlafaxine] Other (See Comments)    On MAR  . Gantrisin [Sulfisoxazole] Other (See Comments)    On MAR  . Levaquin [Levofloxacin] Other (See Comments)    On MAR  . Sulfa Antibiotics Rash     PERTINENT MEDICATIONS:  Outpatient Encounter Medications as of 01/24/2021  Medication Sig  . acetaminophen (TYLENOL) 500 MG tablet Take 1,000 mg by mouth every 6 (six) hours as needed for mild pain, fever or headache.   . calcitonin, salmon, (MIACALCIN/FORTICAL) 200 UNIT/ACT nasal spray Place 1 spray into alternate nostrils daily.  . calcium carbonate (OSCAL) 1500 (600 Ca) MG TABS tablet Take 600 mg of elemental calcium by mouth daily  with breakfast.  . cholecalciferol (VITAMIN D3) 25 MCG (1000 UT) tablet Take 1,000 Units by mouth daily with breakfast.   . DESITIN 40 % PSTE Apply 1 application topically in the morning and at bedtime.  . digoxin (LANOXIN) 0.125 MG tablet Take 0.125 mg by mouth every other day.   . donepezil (ARICEPT) 5 MG tablet Take 5 mg by mouth at bedtime.  . DULoxetine (CYMBALTA) 30 MG capsule Take 30 mg by mouth daily.  Marland Kitchen HYDROcodone-acetaminophen (NORCO/VICODIN) 5-325 MG tablet Take 1-2 tablets by mouth every 6 (six) hours as needed.  . Infant Care Products [facility] EX) Apply 1 application topically See admin instructions. Dermacloud ointment- Apply to buttocks after each incontinent episode  . Infant Care Products [facility]) OINT Apply 1 application topically in the morning and at bedtime.  . lactose free nutrition (BOOST) LIQD Take 237 mLs by mouth 2 (two) times daily between meals.  . latanoprost (XALATAN) 0.005 % ophthalmic solution Place 1 drop into both eyes at bedtime.  . levETIRAcetam (KEPPRA) 500 MG tablet Take 500 mg by mouth 2 (two) times daily.  . NON FORMULARY Take 120 mLs by mouth See admin instructions. MedPass- Drink 120 ml's by mouth two times a day  . Nystatin (GERHARDT'S BUTT CREAM) CREA Apply 1 application topically 2 (two) times daily.  . ondansetron (ZOFRAN) 4 MG tablet Take 1 tablet (4 mg total) by mouth every 6 (  six) hours as needed for nausea.  . pantoprazole (PROTONIX) 40 MG tablet Take 40 mg by mouth daily.  . pregabalin (LYRICA) 50 MG capsule Take 50 mg by mouth 2 (two) times daily.  Marland Kitchen saccharomyces boulardii (FLORASTOR) 250 MG capsule Take 1 capsule (250 mg total) by mouth 2 (two) times daily.  . timolol (TIMOPTIC) 0.5 % ophthalmic solution Place 1 drop into both eyes daily.  . traMADol (ULTRAM) 50 MG tablet Take 1 tablet (50 mg total) by mouth every 8 (eight) hours as needed for severe pain.  . traZODone (DESYREL) 50 MG tablet Take 25 mg by mouth every evening.    No  facility-administered encounter medications on file as of 01/24/2021.    Thank you for the opportunity to participate in the care of Ms. Rexene Agent. The palliative care team will continue to follow. Please call our office at [phone] if we can be of additional assistance.  Jari Favre, DNP, AGPCNP-BC

## 2021-01-24 NOTE — Progress Notes (Signed)
[facility] Consult Note Telephone: [phone]  Fax: [phone]  PATIENT NAME: Kendra Smith [address] [place] [address] [phone] (home)  DOB: Apr 30, 1926 MRN: [id number]  PRIMARY CARE PROVIDER:    Prince Solian, MD,  [place] [address] [phone]  REFERRING PROVIDER:   Prince Solian, [place],  [place] [address] [phone]  RESPONSIBLE PARTY:   Extended Emergency Contact Information Primary Emergency Contact: Long,Linda Address: [address] [place], [place] [address] Johnnette Litter of [place] Phone: [phone] Mobile Phone: [phone] Relation: Daughter Secondary Emergency Contact: [place] of [place] Phone: [phone] Mobile Phone: [phone] Relation: Relative  I met face to face with patient and daughter [name] in facility. Patient present but unable to substantively engage in process.   ASSESSMENT AND RECOMMENDATIONS:   Advance Care Planning: Today's visit consisted of building trust and discussions on Palliative care medicine as a specialized medical care for people living with serious illness, aimed at facilitating improved quality of life through symptoms relief, assisting with advance care planning and establishing goals of care. Daughter expressed appreciation for education provided on Palliative care and how it differs from Hospice service. Palliative care will continue to provide support to patient, family and the medical team. Goal of care: Patient's goal of care is comfort, daughter verbalized desire to not do anything that could prolong patient's  suffering. Directives: Patient has a signed DNR form on file in the facility, copy on [facility] EMR. The need to complete a MOST form was discussed, patient and daughter expressed interest in completing a MOST form. Discussed and reviewed sections of  the form in detail, opportunity for questions given, all questions answered. Form completed and signed with daughter, form left with facility medical record office. Copy of MOST form uploaded to [facility] EMR. Details of MOST include comfort measures, determine use or limitation of antibiotics, IV fluids for a defined trial period, no feeding tube.  Symptom Management:  Failure to thrive: ongoing weakness since hospitalization. Family report steady decline in both function and cognition. Family does not want patient to get in cycle of going to hospital getting fluid and repeating cyclesagian. No report of fever, chills, or evidence of acute infection.  Discussion on disease trajectory of dementia with family, as it is progressive and terminal and likely to eventually lead to dysphagia, weight loss and immobility. Emotional and supportive care provided. Discussed need for supportive care like assist patient with feeding during meals, routinely offering oral fluid intake. Patient on Dysphagia 3 diet with thin liquids, no report of cough or spluttering during meals.  Follow up Palliative Care Visit: Palliative care will continue to follow for complex decision making and symptom management. Return 4-6 weeks or prn.  Family /Caregiver/Community Supports: Patient currently resides in a SNF.   Cognitive / Functional decline: Daughter report patient sleeps a lot, total care with ALDs, unable to feed self. Unable to self transfer independently, non ambulatory, unable to sit upright without falling over. No report of recent falls.  I spent 60 minutes providing this consultation, time includes time spent with patient/family, chart review, provider coordination, and documentation. More than 50% of the time in this consultation was spent counseling and coordinating communication.   CHIEF COMPLAINT: Weakness  History obtained from review of EMR, discussion with facility staff, and interview with family.  Records reviewed  and summarized bellow.  HISTORY OF PRESENT ILLNESS:  Kendra Smith is a 87 y.o. year old female with multiple medical problems including Dementia (7a), paroxysmal Afib, seizures, CKD 3a. Patient s/p hospitalization 01/03/2021 to 01/07/2021 for acute renal failure secondary to dehydration due to poor oral intake. Palliative Care was asked to help address advance care planning and goals of care. This is an initial visit.  CODE STATUS: DNR  PPS: 30%  HOSPICE ELIGIBILITY/DIAGNOSIS: TBD  ROS limited ROS due to poor cognition, patient denied pain Other 10 point ROS reviewed daughter and -ve  Physical Exam: Current and past weights:  General: frail appearing, thin, lying in bed in NAD EYES: anicteric sclera, lids intact, no discharge  ENMT: intact hearing,oral mucous membranes dry CV:  no LE edema Pulmonary: no increased work of breathing, no cough, no audible wheezes, room air Abdomen:  no ascites GU: deferred MSK: severe sarcopenia, decreased ROM in all extremities, no contractures of LE, non ambulatory Skin: warm and dry, no rashes or wounds on visible skin Neuro: Generalized weakness, severe cognitive impairment Psych: non-anxious affect today, A and O x 1 Hem/lymph/immuno: no widespread bruising   PAST MEDICAL HISTORY:  Past Medical History:  Diagnosis Date  . Depression   . Glaucoma   . Hyperlipidemia   . Hypertension   . Osteoporosis     SOCIAL HX:  Social History   Tobacco Use  . Smoking status: Never Smoker  . Smokeless tobacco: Never Used  Substance Use Topics  . Alcohol use: No   FAMILY HX: No family history on file.  ALLERGIES:  Allergies  Allergen Reactions  . Ciprofloxacin Hcl Other (See Comments)    On MAR  . Effexor [Venlafaxine] Other (See Comments)    On MAR  . Gantrisin [Sulfisoxazole] Other (See Comments)    On MAR  . Levaquin [Levofloxacin] Other (See Comments)    On MAR  . Sulfa Antibiotics Rash     PERTINENT  MEDICATIONS:  Outpatient Encounter Medications as of 01/24/2021  Medication Sig  . acetaminophen (TYLENOL) 500 MG tablet Take 1,000 mg by mouth every 6 (six) hours as needed for mild pain, fever or headache.   . calcitonin, salmon, (MIACALCIN/FORTICAL) 200 UNIT/ACT nasal spray Place 1 spray into alternate nostrils daily.  . calcium carbonate (OSCAL) 1500 (600 Ca) MG TABS tablet Take 600 mg of elemental calcium by mouth daily with breakfast.  . cholecalciferol (VITAMIN D3) 25 MCG (1000 UT) tablet Take 1,000 Units by mouth daily with breakfast.   . DESITIN 40 % PSTE Apply 1 application topically in the morning and at bedtime.  . digoxin (LANOXIN) 0.125 MG tablet Take 0.125 mg by mouth every other day.   . donepezil (ARICEPT) 5 MG tablet Take 5 mg by mouth at bedtime.  . DULoxetine (CYMBALTA) 30 MG capsule Take 30 mg by mouth daily.  Marland Kitchen HYDROcodone-acetaminophen (NORCO/VICODIN) 5-325 MG tablet Take 1-2 tablets by mouth every 6 (six) hours as needed.  . Infant Care Products [facility] EX) Apply 1 application topically See admin instructions. Dermacloud ointment- Apply to buttocks after each incontinent episode  . Infant Care Products [facility]) OINT Apply 1 application topically in the morning and at bedtime.  . lactose free nutrition (BOOST) LIQD Take 237 mLs by mouth 2 (two) times daily between meals.  . latanoprost (XALATAN) 0.005 % ophthalmic solution Place 1 drop into both eyes at bedtime.  . levETIRAcetam (KEPPRA) 500 MG tablet Take 500 mg by mouth 2 (two) times daily.  Marland Kitchen NON  FORMULARY Take 120 mLs by mouth See admin instructions. MedPass- Drink 120 ml's by mouth two times a day  . Nystatin (GERHARDT'S BUTT CREAM) CREA Apply 1 application topically 2 (two) times daily.  . ondansetron (ZOFRAN) 4 MG tablet Take 1 tablet (4 mg total) by mouth every 6 (six) hours as needed for nausea.  . pantoprazole (PROTONIX) 40 MG tablet Take 40 mg by mouth daily.  . pregabalin (LYRICA) 50 MG capsule Take 50  mg by mouth 2 (two) times daily.  Marland Kitchen saccharomyces boulardii (FLORASTOR) 250 MG capsule Take 1 capsule (250 mg total) by mouth 2 (two) times daily.  . timolol (TIMOPTIC) 0.5 % ophthalmic solution Place 1 drop into both eyes daily.  . traMADol (ULTRAM) 50 MG tablet Take 1 tablet (50 mg total) by mouth every 8 (eight) hours as needed for severe pain.  . traZODone (DESYREL) 50 MG tablet Take 25 mg by mouth every evening.    No facility-administered encounter medications on file as of 01/24/2021.    Thank you for the opportunity to participate in the care of Ms. Rexene Agent. The palliative care team will continue to follow. Please call our office at [phone] if we can be of additional assistance.  Jari Favre, DNP, AGPCNP-BC

## 2021-01-28 ENCOUNTER — Emergency Department (HOSPITAL_COMMUNITY)
Admission: EM | Admit: 2021-01-28 | Discharge: 2021-01-28 | Disposition: A | Discharge status: Home / Self Care | Payer: Medicare Other | Attending: Emergency Medicine | Admitting: Emergency Medicine

## 2021-01-28 ENCOUNTER — Emergency Department (HOSPITAL_COMMUNITY): Payer: Medicare Other

## 2021-01-28 ENCOUNTER — Other Ambulatory Visit: Payer: Self-pay

## 2021-01-28 ENCOUNTER — Encounter (HOSPITAL_COMMUNITY): Payer: Self-pay | Admitting: *Deleted

## 2021-01-28 DIAGNOSIS — R102 Pelvic and perineal pain: Secondary | ICD-10-CM | POA: Diagnosis not present

## 2021-01-28 DIAGNOSIS — I2510 Atherosclerotic heart disease of native coronary artery without angina pectoris: Secondary | ICD-10-CM | POA: Diagnosis not present

## 2021-01-28 DIAGNOSIS — Y92129 Unspecified place in nursing home as the place of occurrence of the external cause: Secondary | ICD-10-CM | POA: Insufficient documentation

## 2021-01-28 DIAGNOSIS — I129 Hypertensive chronic kidney disease with stage 1 through stage 4 chronic kidney disease, or unspecified chronic kidney disease: Secondary | ICD-10-CM | POA: Insufficient documentation

## 2021-01-28 DIAGNOSIS — Z043 Encounter for examination and observation following other accident: Secondary | ICD-10-CM | POA: Insufficient documentation

## 2021-01-28 DIAGNOSIS — S199XXA Unspecified injury of neck, initial encounter: Secondary | ICD-10-CM | POA: Diagnosis not present

## 2021-01-28 DIAGNOSIS — W050XXA Fall from non-moving wheelchair, initial encounter: Secondary | ICD-10-CM | POA: Insufficient documentation

## 2021-01-28 DIAGNOSIS — S32392A Other fracture of left ilium, initial encounter for closed fracture: Secondary | ICD-10-CM | POA: Diagnosis not present

## 2021-01-28 DIAGNOSIS — N1831 Chronic kidney disease, stage 3a: Secondary | ICD-10-CM | POA: Diagnosis not present

## 2021-01-28 DIAGNOSIS — S32492A Other specified fracture of left acetabulum, initial encounter for closed fracture: Secondary | ICD-10-CM | POA: Diagnosis not present

## 2021-01-28 DIAGNOSIS — M47812 Spondylosis without myelopathy or radiculopathy, cervical region: Secondary | ICD-10-CM | POA: Diagnosis not present

## 2021-01-28 DIAGNOSIS — G319 Degenerative disease of nervous system, unspecified: Secondary | ICD-10-CM | POA: Diagnosis not present

## 2021-01-28 DIAGNOSIS — S3993XA Unspecified injury of pelvis, initial encounter: Secondary | ICD-10-CM | POA: Diagnosis not present

## 2021-01-28 DIAGNOSIS — F0390 Unspecified dementia without behavioral disturbance: Secondary | ICD-10-CM | POA: Diagnosis not present

## 2021-01-28 DIAGNOSIS — J9811 Atelectasis: Secondary | ICD-10-CM | POA: Diagnosis not present

## 2021-01-28 DIAGNOSIS — W19XXXA Unspecified fall, initial encounter: Secondary | ICD-10-CM

## 2021-01-28 DIAGNOSIS — S0003XA Contusion of scalp, initial encounter: Secondary | ICD-10-CM | POA: Diagnosis not present

## 2021-01-28 DIAGNOSIS — S32512A Fracture of superior rim of left pubis, initial encounter for closed fracture: Secondary | ICD-10-CM | POA: Diagnosis not present

## 2021-01-28 DIAGNOSIS — Z8616 Personal history of COVID-19: Secondary | ICD-10-CM | POA: Insufficient documentation

## 2021-01-28 DIAGNOSIS — I6782 Cerebral ischemia: Secondary | ICD-10-CM | POA: Diagnosis not present

## 2021-01-28 HISTORY — DX: Unspecified dementia, unspecified severity, without behavioral disturbance, psychotic disturbance, mood disturbance, and anxiety: F03.90

## 2021-01-28 NOTE — ED Provider Notes (Signed)
[facility]   CSN: [id number] Arrival date & time: 01/28/21  5400     History Chief Complaint  Patient presents with  . Fall    Kendra Smith is a 87 y.o. female with PMH/o Dementia brought in by EMS from [facility] who presents for evaluation of unwitnessed fall.  Patient apparently fell out of wheelchair.  EM LEVEL 5 CAVEAT DUE TO DEMENTIA  The history is provided by the EMS personnel.       Past Medical History:  Diagnosis Date  . Dementia (HCC)   . Depression   . Glaucoma   . Hyperlipidemia   . Hypertension   . Osteoporosis     Patient Active Problem List   Diagnosis Date Noted  . Pressure injury of skin 01/04/2021  . Acute renal failure superimposed on stage 3a chronic kidney disease (HCC) 01/03/2021  . Severe sepsis (HCC) 01/03/2021  . Metabolic acidosis 01/03/2021  . Acute metabolic encephalopathy 01/03/2021  . Seizure (HCC) 05/28/2020  . Weakness generalized   . GI bleed 08/30/2019  . UTI (urinary tract infection) 08/30/2019  . Hypokalemia 08/30/2019  . Demand ischemia (HCC) 08/30/2019  . Palliative care by specialist   . COVID-19   . DNR (do not resuscitate)   . Adult failure to thrive   . Left acetabular fracture (HCC) 08/23/2019  . Pelvic rami fracture (HCC) 08/23/2019  . PAF (paroxysmal atrial fibrillation) (HCC) 08/23/2019  . Rib fracture-12 01/12/2019  . Fracture of transverse process of lumbar vertebra L1-L2 01/12/2019  . Dementia (HCC) 01/12/2019  . Fall 01/12/2019  . Leukocytosis 01/12/2019  . Depression   . Hypertension     Past Surgical History:  Procedure Laterality Date  . ABDOMINAL HYSTERECTOMY    . ANKLE ARTHROSCOPY    . CHOLECYSTECTOMY    . EYE SURGERY    . RECTOCELE REPAIR       OB History   No obstetric history on file.     No family history on file.  Social History   Tobacco Use  . Smoking status: Never Smoker  . Smokeless tobacco: Never  Used  Vaping Use  . Vaping Use: Never used  Substance Use Topics  . Alcohol use: No  . Drug use: No    Home Medications Prior to Admission medications   Medication Sig Start Date End Date Taking? Authorizing Provider  calcitonin, salmon, (MIACALCIN/FORTICAL) 200 UNIT/ACT nasal spray Place 1 spray into alternate nostrils daily.   Yes [provider]  calcium carbonate (OSCAL) 1500 (600 Ca) MG TABS tablet Take 600 mg of elemental calcium by mouth daily with breakfast.   Yes [provider]  cholecalciferol (VITAMIN D3) 25 MCG (1000 UT) tablet Take 1,000 Units by mouth daily with breakfast.    Yes [provider]  digoxin (LANOXIN) 0.125 MG tablet Take 0.125 mg by mouth every other day.    Yes [provider]  donepezil (ARICEPT) 5 MG tablet Take 5 mg by mouth at bedtime. 10/15/18  Yes [provider]  pantoprazole (PROTONIX) 40 MG tablet Take 40 mg by mouth daily.   Yes [provider]  timolol (TIMOPTIC) 0.5 % ophthalmic solution Place 1 drop into both eyes daily. 06/15/14  Yes [provider]  acetaminophen (TYLENOL) 500 MG tablet Take 1,000 mg by mouth every 6 (six) hours as needed for mild pain, fever or headache.     [provider]  DESITIN 40 % PSTE Apply 1 application  topically in the morning and at bedtime. 11/05/20   [provider]  DULoxetine (CYMBALTA) 30 MG capsule Take 30 mg by mouth daily. 12/11/20   [provider]  HYDROcodone-acetaminophen (NORCO/VICODIN) 5-325 MG tablet Take 1-2 tablets by mouth every 6 (six) hours as needed. 01/07/21   Merlene Laughter, DO  [employer] [facility] [place]) Apply 1 application topically See admin instructions. Dermacloud ointment- Apply to buttocks after each incontinent episode    [provider]  [employer] [facility]) OINT Apply 1 application topically in the morning and at bedtime. 10/09/20   [provider]  lactose  free nutrition (BOOST) LIQD Take 237 mLs by mouth 2 (two) times daily between meals.    [provider]  latanoprost (XALATAN) 0.005 % ophthalmic solution Place 1 drop into both eyes at bedtime.    [provider]  levETIRAcetam (KEPPRA) 500 MG tablet Take 500 mg by mouth 2 (two) times daily.    [provider]  NON FORMULARY Take 120 mLs by mouth See admin instructions. MedPass- Drink 120 ml's by mouth two times a day    [provider]  Nystatin (GERHARDT'S BUTT CREAM) CREA Apply 1 application topically 2 (two) times daily. 01/07/21   Marguerita Merles Latif, DO  ondansetron (ZOFRAN) 4 MG tablet Take 1 tablet (4 mg total) by mouth every 6 (six) hours as needed for nausea. 01/07/21   Marguerita Merles Latif, DO  pregabalin (LYRICA) 50 MG capsule Take 50 mg by mouth 2 (two) times daily.    [provider]  saccharomyces boulardii (FLORASTOR) 250 MG capsule Take 1 capsule (250 mg total) by mouth 2 (two) times daily. 01/07/21   Marguerita Merles Latif, DO  traMADol (ULTRAM) 50 MG tablet Take 1 tablet (50 mg total) by mouth every 8 (eight) hours as needed for severe pain. 01/07/21   Marguerita Merles Latif, DO  traZODone (DESYREL) 50 MG tablet Take 25 mg by mouth every evening.  10/15/18   [provider]    Allergies    Ciprofloxacin hcl, Effexor [venlafaxine], Gantrisin [sulfisoxazole], Levaquin [levofloxacin], and Sulfa antibiotics  Review of Systems   Review of Systems  Unable to perform ROS: Dementia    Physical Exam Updated Vital Signs BP 138/61   Pulse 78   Temp (!) 97.2 F (36.2 C) (Oral)   Resp 15   Wt 48.5 kg   SpO2 100%   BMI 18.94 kg/m   Physical Exam Vitals and nursing note reviewed.  Constitutional:      Appearance: Normal appearance. She is well-developed.  HENT:     Head: Normocephalic and atraumatic.     Comments: No tenderness to palpation of skull. No deformities or crepitus noted. No open wounds, abrasions or lacerations.  Eyes:      General: Lids are normal.     Conjunctiva/sclera: Conjunctivae normal.     Pupils: Pupils are equal, round, and reactive to light.  Neck:     Comments: C collar in place.  Cardiovascular:     Rate and Rhythm: Normal rate and regular rhythm.     Pulses: Normal pulses.     Heart sounds: Normal heart sounds. No murmur heard. No friction rub. No gallop.   Pulmonary:     Effort: Pulmonary effort is normal.     Breath sounds: Normal breath sounds.     Comments: Lungs clear to auscultation bilaterally.  Symmetric chest rise.  No wheezing, rales, rhonchi. Abdominal:     Palpations: Abdomen  is soft. Abdomen is not rigid.     Tenderness: There is no abdominal tenderness. There is no guarding.  Musculoskeletal:        General: Normal range of motion.     Cervical back: Full passive range of motion without pain.     Comments: Pelvic protuberance noted.  No deformity or crepitus noted.  No pelvic instability.  Flexion/extension of bilateral lower extremities intact.  I am able to passively internal and externally rotate the legs without any difficulty.  Skin:    General: Skin is warm and dry.     Capillary Refill: Capillary refill takes less than 2 seconds.  Neurological:     Mental Status: She is alert.     Comments: Oriented to name only Follows some commands Difficulty assessing full neuro exam secondary to cooperation.  Normal strength.   Psychiatric:        Speech: Speech normal.     ED Results / Procedures / Treatments   Labs (all labs ordered are listed, but only abnormal results are displayed) Labs Reviewed - No data to display  EKG None  Radiology DG Pelvis 1-2 Views  Result Date: 01/28/2021 CLINICAL DATA:  Fall. EXAM: PELVIS - 1-2 VIEW COMPARISON:  CT abdomen/pelvis 08/29/2019 FINDINGS: Bones are markedly demineralized. Extensive protrusio noted left acetabulum secondary to comminuted fracture noted as acute on previous CT of 2020. Fracture extended into the left iliac  bone on that exam and a portion of the fracture line is still visible at the left iliac crest today. Fractures involving the superior and inferior pubic rami again noted. No definite acute fracture on the current study. SI joints and symphysis pubis unremarkable. IMPRESSION: No definite acute fracture on this study. Prominent protrusio noted left acetabulum secondary to comminuted left acetabular fracture as acute on previous CT. Old fractures of the left superior and inferior pubic rami Electronically Signed   By: Kennith CenterEric  Mansell M.D.   On: 01/28/2021 11:23   CT Head Wo Contrast  Result Date: 01/28/2021 CLINICAL DATA:  Unwitnessed slide out of her wheelchair. EXAM: CT HEAD WITHOUT CONTRAST CT CERVICAL SPINE WITHOUT CONTRAST TECHNIQUE: Multidetector CT imaging of the head and cervical spine was performed following the standard protocol without intravenous contrast. Multiplanar CT image reconstructions of the cervical spine were also generated. COMPARISON:  CT head dated January 03, 2021. CT head and cervical spine dated July 30, 2020. FINDINGS: CT HEAD FINDINGS Brain: No evidence of acute infarction, hemorrhage, hydrocephalus, extra-axial collection or mass lesion/mass effect. Stable atrophy and chronic microvascular ischemic changes. Vascular: Calcified atherosclerosis at the skullbase. No hyperdense vessel. Skull: Normal. Negative for fracture or focal lesion. Sinuses/Orbits: No acute finding. Other: Tiny midline frontal scalp hematoma. CT CERVICAL SPINE FINDINGS Alignment: Normal. Skull base and vertebrae: No acute fracture. No primary bone lesion or focal pathologic process. Soft tissues and spinal canal: No prevertebral fluid or swelling. No visible canal hematoma. Disc levels: Unchanged mild C4-C5 and mild-to-moderate C5-C6 disc height loss and moderate right greater than left uncovertebral hypertrophy. Unchanged mild facet arthropathy throughout the cervical spine. Upper chest: Minimal dependent  atelectasis. Other: None. IMPRESSION: 1. No acute intracranial abnormality. Tiny midline frontal scalp hematoma. 2. No acute cervical spine fracture or traumatic listhesis. Electronically Signed   By: Obie DredgeWilliam T Derry M.D.   On: 01/28/2021 11:47   CT Cervical Spine Wo Contrast  Result Date: 01/28/2021 CLINICAL DATA:  Unwitnessed slide out of her wheelchair. EXAM: CT HEAD WITHOUT CONTRAST CT CERVICAL SPINE WITHOUT CONTRAST TECHNIQUE:  Multidetector CT imaging of the head and cervical spine was performed following the standard protocol without intravenous contrast. Multiplanar CT image reconstructions of the cervical spine were also generated. COMPARISON:  CT head dated January 03, 2021. CT head and cervical spine dated July 30, 2020. FINDINGS: CT HEAD FINDINGS Brain: No evidence of acute infarction, hemorrhage, hydrocephalus, extra-axial collection or mass lesion/mass effect. Stable atrophy and chronic microvascular ischemic changes. Vascular: Calcified atherosclerosis at the skullbase. No hyperdense vessel. Skull: Normal. Negative for fracture or focal lesion. Sinuses/Orbits: No acute finding. Other: Tiny midline frontal scalp hematoma. CT CERVICAL SPINE FINDINGS Alignment: Normal. Skull base and vertebrae: No acute fracture. No primary bone lesion or focal pathologic process. Soft tissues and spinal canal: No prevertebral fluid or swelling. No visible canal hematoma. Disc levels: Unchanged mild C4-C5 and mild-to-moderate C5-C6 disc height loss and moderate right greater than left uncovertebral hypertrophy. Unchanged mild facet arthropathy throughout the cervical spine. Upper chest: Minimal dependent atelectasis. Other: None. IMPRESSION: 1. No acute intracranial abnormality. Tiny midline frontal scalp hematoma. 2. No acute cervical spine fracture or traumatic listhesis. Electronically Signed   By: Obie Dredge M.D.   On: 01/28/2021 11:47    Procedures Procedures   Medications Ordered in  ED Medications - No data to display  ED Course  I have reviewed the triage vital signs and the nursing notes.  Pertinent labs & imaging results that were available during my care of the patient were reviewed by me and considered in my medical decision making (see chart for details).    MDM Rules/Calculators/A&P                          87 year old female brought in by EMS from nursing home for evaluation of unwitnessed fall.  Patient has a history of dementia and unable to provide history.  On initial arrival, she is afebrile nontoxic-appearing.  Vital signs are stable.  On exam, c-collar is in place.  No obvious signs of trauma/injury.  She has a slight bony protuberance noted to her pelvic area.  No deformity or crepitus noted.  She is able to move her legs.  Given unclear etiology of her fall, will obtain imaging of head, neck, pelvis.  Attempted to discussed with [facility] but was unable to get in contact with anybody. ([facility]).   CT head negative for any acute intracranial abnormality. CT C spine negative for any acute bony abnormality.  XR of hip shows no acute fracture. She does have evidence of prominent protrusio noted secondary to old comminuted left acetabular fracture as seen on previous CT.   Attempted to call [facility] again. I was put through the nursing supervisor but was unable to contact her. I left a HIPPA compliant voice memo.   We will plan for dispo home with PT arm.  Patient is resting comfortably in bed.  Portions of this note were generated with Scientist, clinical (histocompatibility and immunogenetics). Dictation errors may occur despite best attempts at proofreading.   Final Clinical Impression(s) / ED Diagnoses Final diagnoses:  Fall, initial encounter    Rx / DC Orders ED Discharge Orders    None       Rosana Hoes 01/28/21 1517    Little, Ambrose Finland, MD 01/29/21 [id number]

## 2021-01-28 NOTE — ED Triage Notes (Signed)
BIB EMS from [facility] after unwitnessed slide out from W/c. Placed in c collar du to no witness. Pt has Dementia. No noted injuries. [id number]

## 2021-01-28 NOTE — ED Notes (Signed)
[facility] notified of transport, unknown ETA 

## 2021-01-28 NOTE — Discharge Instructions (Addendum)
Her scans are reassuring.   Please have her follow up with her primary care doctor.

## 2021-01-29 DIAGNOSIS — W19XXXD Unspecified fall, subsequent encounter: Secondary | ICD-10-CM | POA: Diagnosis not present

## 2021-01-29 DIAGNOSIS — R251 Tremor, unspecified: Secondary | ICD-10-CM | POA: Diagnosis not present

## 2021-01-29 DIAGNOSIS — F329 Major depressive disorder, single episode, unspecified: Secondary | ICD-10-CM | POA: Diagnosis not present

## 2021-01-29 DIAGNOSIS — I10 Essential (primary) hypertension: Secondary | ICD-10-CM | POA: Diagnosis not present

## 2021-01-29 DIAGNOSIS — R4182 Altered mental status, unspecified: Secondary | ICD-10-CM | POA: Diagnosis not present

## 2021-01-29 DIAGNOSIS — F0390 Unspecified dementia without behavioral disturbance: Secondary | ICD-10-CM | POA: Diagnosis not present

## 2021-02-08 DIAGNOSIS — R251 Tremor, unspecified: Secondary | ICD-10-CM | POA: Diagnosis not present

## 2021-02-08 DIAGNOSIS — F329 Major depressive disorder, single episode, unspecified: Secondary | ICD-10-CM | POA: Diagnosis not present

## 2021-02-08 DIAGNOSIS — I480 Paroxysmal atrial fibrillation: Secondary | ICD-10-CM | POA: Diagnosis not present

## 2021-02-08 DIAGNOSIS — F0390 Unspecified dementia without behavioral disturbance: Secondary | ICD-10-CM | POA: Diagnosis not present

## 2021-02-08 DIAGNOSIS — I10 Essential (primary) hypertension: Secondary | ICD-10-CM | POA: Diagnosis not present

## 2021-02-13 DIAGNOSIS — G4089 Other seizures: Secondary | ICD-10-CM | POA: Diagnosis not present

## 2021-02-13 DIAGNOSIS — F0390 Unspecified dementia without behavioral disturbance: Secondary | ICD-10-CM | POA: Diagnosis not present

## 2021-02-13 DIAGNOSIS — R059 Cough, unspecified: Secondary | ICD-10-CM | POA: Diagnosis not present

## 2021-02-13 DIAGNOSIS — R251 Tremor, unspecified: Secondary | ICD-10-CM | POA: Diagnosis not present

## 2021-02-13 DIAGNOSIS — I480 Paroxysmal atrial fibrillation: Secondary | ICD-10-CM | POA: Diagnosis not present

## 2021-02-17 DIAGNOSIS — K219 Gastro-esophageal reflux disease without esophagitis: Secondary | ICD-10-CM | POA: Diagnosis not present

## 2021-02-17 DIAGNOSIS — F329 Major depressive disorder, single episode, unspecified: Secondary | ICD-10-CM | POA: Diagnosis not present

## 2021-02-17 DIAGNOSIS — G4089 Other seizures: Secondary | ICD-10-CM | POA: Diagnosis not present

## 2021-02-17 DIAGNOSIS — G40901 Epilepsy, unspecified, not intractable, with status epilepticus: Secondary | ICD-10-CM | POA: Diagnosis not present

## 2021-02-17 DIAGNOSIS — F0390 Unspecified dementia without behavioral disturbance: Secondary | ICD-10-CM | POA: Diagnosis not present

## 2021-02-17 DIAGNOSIS — I480 Paroxysmal atrial fibrillation: Secondary | ICD-10-CM | POA: Diagnosis not present

## 2021-02-17 DIAGNOSIS — R251 Tremor, unspecified: Secondary | ICD-10-CM | POA: Diagnosis not present

## 2021-03-12 DIAGNOSIS — I480 Paroxysmal atrial fibrillation: Secondary | ICD-10-CM | POA: Diagnosis not present

## 2021-03-12 DIAGNOSIS — F329 Major depressive disorder, single episode, unspecified: Secondary | ICD-10-CM | POA: Diagnosis not present

## 2021-03-12 DIAGNOSIS — F0390 Unspecified dementia without behavioral disturbance: Secondary | ICD-10-CM | POA: Diagnosis not present

## 2021-03-12 DIAGNOSIS — I10 Essential (primary) hypertension: Secondary | ICD-10-CM | POA: Diagnosis not present

## 2021-03-27 DIAGNOSIS — G9340 Encephalopathy, unspecified: Secondary | ICD-10-CM | POA: Diagnosis not present

## 2021-03-27 DIAGNOSIS — D649 Anemia, unspecified: Secondary | ICD-10-CM | POA: Diagnosis not present

## 2021-03-27 DIAGNOSIS — F0390 Unspecified dementia without behavioral disturbance: Secondary | ICD-10-CM | POA: Diagnosis not present

## 2021-03-27 DIAGNOSIS — N289 Disorder of kidney and ureter, unspecified: Secondary | ICD-10-CM | POA: Diagnosis not present

## 2021-04-17 DIAGNOSIS — Z13228 Encounter for screening for other metabolic disorders: Secondary | ICD-10-CM | POA: Diagnosis not present

## 2021-04-17 DIAGNOSIS — R946 Abnormal results of thyroid function studies: Secondary | ICD-10-CM | POA: Diagnosis not present

## 2021-04-17 DIAGNOSIS — E550 Rickets, active: Secondary | ICD-10-CM | POA: Diagnosis not present

## 2021-04-19 DIAGNOSIS — G4089 Other seizures: Secondary | ICD-10-CM | POA: Diagnosis not present

## 2021-04-19 DIAGNOSIS — R251 Tremor, unspecified: Secondary | ICD-10-CM | POA: Diagnosis not present

## 2021-04-19 DIAGNOSIS — F329 Major depressive disorder, single episode, unspecified: Secondary | ICD-10-CM | POA: Diagnosis not present

## 2021-04-19 DIAGNOSIS — I480 Paroxysmal atrial fibrillation: Secondary | ICD-10-CM | POA: Diagnosis not present

## 2021-04-19 DIAGNOSIS — F0390 Unspecified dementia without behavioral disturbance: Secondary | ICD-10-CM | POA: Diagnosis not present

## 2021-04-23 DIAGNOSIS — R21 Rash and other nonspecific skin eruption: Secondary | ICD-10-CM | POA: Diagnosis not present

## 2021-04-23 DIAGNOSIS — F0390 Unspecified dementia without behavioral disturbance: Secondary | ICD-10-CM | POA: Diagnosis not present

## 2021-04-23 DIAGNOSIS — I10 Essential (primary) hypertension: Secondary | ICD-10-CM | POA: Diagnosis not present

## 2021-04-23 DIAGNOSIS — I480 Paroxysmal atrial fibrillation: Secondary | ICD-10-CM | POA: Diagnosis not present

## 2021-05-07 DIAGNOSIS — I480 Paroxysmal atrial fibrillation: Secondary | ICD-10-CM | POA: Diagnosis not present

## 2021-05-07 DIAGNOSIS — W19XXXD Unspecified fall, subsequent encounter: Secondary | ICD-10-CM | POA: Diagnosis not present

## 2021-05-07 DIAGNOSIS — I10 Essential (primary) hypertension: Secondary | ICD-10-CM | POA: Diagnosis not present

## 2021-05-07 DIAGNOSIS — F0390 Unspecified dementia without behavioral disturbance: Secondary | ICD-10-CM | POA: Diagnosis not present

## 2021-05-08 DIAGNOSIS — R21 Rash and other nonspecific skin eruption: Secondary | ICD-10-CM | POA: Diagnosis not present

## 2021-05-08 DIAGNOSIS — I480 Paroxysmal atrial fibrillation: Secondary | ICD-10-CM | POA: Diagnosis not present

## 2021-05-08 DIAGNOSIS — G4089 Other seizures: Secondary | ICD-10-CM | POA: Diagnosis not present

## 2021-05-08 DIAGNOSIS — F0390 Unspecified dementia without behavioral disturbance: Secondary | ICD-10-CM | POA: Diagnosis not present

## 2021-05-10 DIAGNOSIS — Z20822 Contact with and (suspected) exposure to covid-19: Secondary | ICD-10-CM | POA: Diagnosis not present

## 2021-05-14 DIAGNOSIS — R2681 Unsteadiness on feet: Secondary | ICD-10-CM | POA: Diagnosis not present

## 2021-05-14 DIAGNOSIS — G4089 Other seizures: Secondary | ICD-10-CM | POA: Diagnosis not present

## 2021-05-14 DIAGNOSIS — R2689 Other abnormalities of gait and mobility: Secondary | ICD-10-CM | POA: Diagnosis not present

## 2021-05-14 DIAGNOSIS — R41841 Cognitive communication deficit: Secondary | ICD-10-CM | POA: Diagnosis not present

## 2021-05-14 DIAGNOSIS — I4891 Unspecified atrial fibrillation: Secondary | ICD-10-CM | POA: Diagnosis not present

## 2021-05-14 DIAGNOSIS — H409 Unspecified glaucoma: Secondary | ICD-10-CM | POA: Diagnosis not present

## 2021-05-14 DIAGNOSIS — F0390 Unspecified dementia without behavioral disturbance: Secondary | ICD-10-CM | POA: Diagnosis not present

## 2021-05-14 DIAGNOSIS — F329 Major depressive disorder, single episode, unspecified: Secondary | ICD-10-CM | POA: Diagnosis not present

## 2021-05-14 DIAGNOSIS — I10 Essential (primary) hypertension: Secondary | ICD-10-CM | POA: Diagnosis not present

## 2021-05-14 DIAGNOSIS — F331 Major depressive disorder, recurrent, moderate: Secondary | ICD-10-CM | POA: Diagnosis not present

## 2021-05-14 DIAGNOSIS — R21 Rash and other nonspecific skin eruption: Secondary | ICD-10-CM | POA: Diagnosis not present

## 2021-05-14 DIAGNOSIS — W19XXXD Unspecified fall, subsequent encounter: Secondary | ICD-10-CM | POA: Diagnosis not present

## 2021-05-14 DIAGNOSIS — R569 Unspecified convulsions: Secondary | ICD-10-CM | POA: Diagnosis not present

## 2021-05-14 DIAGNOSIS — M6281 Muscle weakness (generalized): Secondary | ICD-10-CM | POA: Diagnosis not present

## 2021-05-16 DIAGNOSIS — I4891 Unspecified atrial fibrillation: Secondary | ICD-10-CM | POA: Diagnosis not present

## 2021-05-16 DIAGNOSIS — H409 Unspecified glaucoma: Secondary | ICD-10-CM | POA: Diagnosis not present

## 2021-05-16 DIAGNOSIS — R569 Unspecified convulsions: Secondary | ICD-10-CM | POA: Diagnosis not present

## 2021-05-16 DIAGNOSIS — F331 Major depressive disorder, recurrent, moderate: Secondary | ICD-10-CM | POA: Diagnosis not present

## 2021-05-16 DIAGNOSIS — F0390 Unspecified dementia without behavioral disturbance: Secondary | ICD-10-CM | POA: Diagnosis not present

## 2021-05-16 DIAGNOSIS — I10 Essential (primary) hypertension: Secondary | ICD-10-CM | POA: Diagnosis not present

## 2021-05-20 DIAGNOSIS — R569 Unspecified convulsions: Secondary | ICD-10-CM | POA: Diagnosis not present

## 2021-05-20 DIAGNOSIS — F331 Major depressive disorder, recurrent, moderate: Secondary | ICD-10-CM | POA: Diagnosis not present

## 2021-05-20 DIAGNOSIS — F0390 Unspecified dementia without behavioral disturbance: Secondary | ICD-10-CM | POA: Diagnosis not present

## 2021-05-20 DIAGNOSIS — I4891 Unspecified atrial fibrillation: Secondary | ICD-10-CM | POA: Diagnosis not present

## 2021-05-20 DIAGNOSIS — I10 Essential (primary) hypertension: Secondary | ICD-10-CM | POA: Diagnosis not present

## 2021-05-20 DIAGNOSIS — H409 Unspecified glaucoma: Secondary | ICD-10-CM | POA: Diagnosis not present

## 2021-05-23 DIAGNOSIS — F0390 Unspecified dementia without behavioral disturbance: Secondary | ICD-10-CM | POA: Diagnosis not present

## 2021-05-23 DIAGNOSIS — F331 Major depressive disorder, recurrent, moderate: Secondary | ICD-10-CM | POA: Diagnosis not present

## 2021-05-23 DIAGNOSIS — I10 Essential (primary) hypertension: Secondary | ICD-10-CM | POA: Diagnosis not present

## 2021-05-23 DIAGNOSIS — H409 Unspecified glaucoma: Secondary | ICD-10-CM | POA: Diagnosis not present

## 2021-05-23 DIAGNOSIS — R569 Unspecified convulsions: Secondary | ICD-10-CM | POA: Diagnosis not present

## 2021-05-23 DIAGNOSIS — I4891 Unspecified atrial fibrillation: Secondary | ICD-10-CM | POA: Diagnosis not present

## 2021-05-26 DIAGNOSIS — H409 Unspecified glaucoma: Secondary | ICD-10-CM | POA: Diagnosis not present

## 2021-05-26 DIAGNOSIS — F0390 Unspecified dementia without behavioral disturbance: Secondary | ICD-10-CM | POA: Diagnosis not present

## 2021-05-26 DIAGNOSIS — I10 Essential (primary) hypertension: Secondary | ICD-10-CM | POA: Diagnosis not present

## 2021-05-26 DIAGNOSIS — R569 Unspecified convulsions: Secondary | ICD-10-CM | POA: Diagnosis not present

## 2021-05-26 DIAGNOSIS — F331 Major depressive disorder, recurrent, moderate: Secondary | ICD-10-CM | POA: Diagnosis not present

## 2021-05-26 DIAGNOSIS — I4891 Unspecified atrial fibrillation: Secondary | ICD-10-CM | POA: Diagnosis not present

## 2021-05-27 DIAGNOSIS — F0390 Unspecified dementia without behavioral disturbance: Secondary | ICD-10-CM | POA: Diagnosis not present

## 2021-05-27 DIAGNOSIS — R569 Unspecified convulsions: Secondary | ICD-10-CM | POA: Diagnosis not present

## 2021-05-27 DIAGNOSIS — H409 Unspecified glaucoma: Secondary | ICD-10-CM | POA: Diagnosis not present

## 2021-05-27 DIAGNOSIS — F331 Major depressive disorder, recurrent, moderate: Secondary | ICD-10-CM | POA: Diagnosis not present

## 2021-05-27 DIAGNOSIS — I10 Essential (primary) hypertension: Secondary | ICD-10-CM | POA: Diagnosis not present

## 2021-05-27 DIAGNOSIS — I4891 Unspecified atrial fibrillation: Secondary | ICD-10-CM | POA: Diagnosis not present

## 2021-05-28 DIAGNOSIS — I10 Essential (primary) hypertension: Secondary | ICD-10-CM | POA: Diagnosis not present

## 2021-05-28 DIAGNOSIS — F331 Major depressive disorder, recurrent, moderate: Secondary | ICD-10-CM | POA: Diagnosis not present

## 2021-05-28 DIAGNOSIS — F0390 Unspecified dementia without behavioral disturbance: Secondary | ICD-10-CM | POA: Diagnosis not present

## 2021-05-28 DIAGNOSIS — H409 Unspecified glaucoma: Secondary | ICD-10-CM | POA: Diagnosis not present

## 2021-05-28 DIAGNOSIS — I4891 Unspecified atrial fibrillation: Secondary | ICD-10-CM | POA: Diagnosis not present

## 2021-05-28 DIAGNOSIS — R569 Unspecified convulsions: Secondary | ICD-10-CM | POA: Diagnosis not present

## 2021-05-29 DIAGNOSIS — F0390 Unspecified dementia without behavioral disturbance: Secondary | ICD-10-CM | POA: Diagnosis not present

## 2021-05-29 DIAGNOSIS — F331 Major depressive disorder, recurrent, moderate: Secondary | ICD-10-CM | POA: Diagnosis not present

## 2021-05-29 DIAGNOSIS — H409 Unspecified glaucoma: Secondary | ICD-10-CM | POA: Diagnosis not present

## 2021-05-29 DIAGNOSIS — I10 Essential (primary) hypertension: Secondary | ICD-10-CM | POA: Diagnosis not present

## 2021-05-29 DIAGNOSIS — I4891 Unspecified atrial fibrillation: Secondary | ICD-10-CM | POA: Diagnosis not present

## 2021-05-29 DIAGNOSIS — R569 Unspecified convulsions: Secondary | ICD-10-CM | POA: Diagnosis not present

## 2021-05-30 DIAGNOSIS — H409 Unspecified glaucoma: Secondary | ICD-10-CM | POA: Diagnosis not present

## 2021-05-30 DIAGNOSIS — F0390 Unspecified dementia without behavioral disturbance: Secondary | ICD-10-CM | POA: Diagnosis not present

## 2021-05-30 DIAGNOSIS — I4891 Unspecified atrial fibrillation: Secondary | ICD-10-CM | POA: Diagnosis not present

## 2021-05-30 DIAGNOSIS — F331 Major depressive disorder, recurrent, moderate: Secondary | ICD-10-CM | POA: Diagnosis not present

## 2021-05-30 DIAGNOSIS — I10 Essential (primary) hypertension: Secondary | ICD-10-CM | POA: Diagnosis not present

## 2021-05-30 DIAGNOSIS — R569 Unspecified convulsions: Secondary | ICD-10-CM | POA: Diagnosis not present

## 2021-05-31 DIAGNOSIS — I4891 Unspecified atrial fibrillation: Secondary | ICD-10-CM | POA: Diagnosis not present

## 2021-05-31 DIAGNOSIS — F0390 Unspecified dementia without behavioral disturbance: Secondary | ICD-10-CM | POA: Diagnosis not present

## 2021-05-31 DIAGNOSIS — R569 Unspecified convulsions: Secondary | ICD-10-CM | POA: Diagnosis not present

## 2021-05-31 DIAGNOSIS — F331 Major depressive disorder, recurrent, moderate: Secondary | ICD-10-CM | POA: Diagnosis not present

## 2021-05-31 DIAGNOSIS — H409 Unspecified glaucoma: Secondary | ICD-10-CM | POA: Diagnosis not present

## 2021-05-31 DIAGNOSIS — I10 Essential (primary) hypertension: Secondary | ICD-10-CM | POA: Diagnosis not present

## 2021-06-04 DIAGNOSIS — F0390 Unspecified dementia without behavioral disturbance: Secondary | ICD-10-CM | POA: Diagnosis not present

## 2021-06-04 DIAGNOSIS — I10 Essential (primary) hypertension: Secondary | ICD-10-CM | POA: Diagnosis not present

## 2021-06-04 DIAGNOSIS — H409 Unspecified glaucoma: Secondary | ICD-10-CM | POA: Diagnosis not present

## 2021-06-04 DIAGNOSIS — R569 Unspecified convulsions: Secondary | ICD-10-CM | POA: Diagnosis not present

## 2021-06-04 DIAGNOSIS — I4891 Unspecified atrial fibrillation: Secondary | ICD-10-CM | POA: Diagnosis not present

## 2021-06-04 DIAGNOSIS — F331 Major depressive disorder, recurrent, moderate: Secondary | ICD-10-CM | POA: Diagnosis not present

## 2021-06-05 DIAGNOSIS — I4891 Unspecified atrial fibrillation: Secondary | ICD-10-CM | POA: Diagnosis not present

## 2021-06-05 DIAGNOSIS — R569 Unspecified convulsions: Secondary | ICD-10-CM | POA: Diagnosis not present

## 2021-06-05 DIAGNOSIS — I10 Essential (primary) hypertension: Secondary | ICD-10-CM | POA: Diagnosis not present

## 2021-06-05 DIAGNOSIS — G9340 Encephalopathy, unspecified: Secondary | ICD-10-CM | POA: Diagnosis not present

## 2021-06-05 DIAGNOSIS — F0390 Unspecified dementia without behavioral disturbance: Secondary | ICD-10-CM | POA: Diagnosis not present

## 2021-06-05 DIAGNOSIS — F331 Major depressive disorder, recurrent, moderate: Secondary | ICD-10-CM | POA: Diagnosis not present

## 2021-06-05 DIAGNOSIS — D649 Anemia, unspecified: Secondary | ICD-10-CM | POA: Diagnosis not present

## 2021-06-05 DIAGNOSIS — H409 Unspecified glaucoma: Secondary | ICD-10-CM | POA: Diagnosis not present

## 2021-06-05 DIAGNOSIS — N289 Disorder of kidney and ureter, unspecified: Secondary | ICD-10-CM | POA: Diagnosis not present

## 2021-06-06 DIAGNOSIS — F0390 Unspecified dementia without behavioral disturbance: Secondary | ICD-10-CM | POA: Diagnosis not present

## 2021-06-06 DIAGNOSIS — R569 Unspecified convulsions: Secondary | ICD-10-CM | POA: Diagnosis not present

## 2021-06-06 DIAGNOSIS — I4891 Unspecified atrial fibrillation: Secondary | ICD-10-CM | POA: Diagnosis not present

## 2021-06-06 DIAGNOSIS — H409 Unspecified glaucoma: Secondary | ICD-10-CM | POA: Diagnosis not present

## 2021-06-06 DIAGNOSIS — I10 Essential (primary) hypertension: Secondary | ICD-10-CM | POA: Diagnosis not present

## 2021-06-06 DIAGNOSIS — F331 Major depressive disorder, recurrent, moderate: Secondary | ICD-10-CM | POA: Diagnosis not present

## 2021-06-07 DIAGNOSIS — H409 Unspecified glaucoma: Secondary | ICD-10-CM | POA: Diagnosis not present

## 2021-06-07 DIAGNOSIS — I4891 Unspecified atrial fibrillation: Secondary | ICD-10-CM | POA: Diagnosis not present

## 2021-06-07 DIAGNOSIS — R21 Rash and other nonspecific skin eruption: Secondary | ICD-10-CM | POA: Diagnosis not present

## 2021-06-07 DIAGNOSIS — I480 Paroxysmal atrial fibrillation: Secondary | ICD-10-CM | POA: Diagnosis not present

## 2021-06-07 DIAGNOSIS — F0390 Unspecified dementia without behavioral disturbance: Secondary | ICD-10-CM | POA: Diagnosis not present

## 2021-06-07 DIAGNOSIS — I10 Essential (primary) hypertension: Secondary | ICD-10-CM | POA: Diagnosis not present

## 2021-06-07 DIAGNOSIS — F329 Major depressive disorder, single episode, unspecified: Secondary | ICD-10-CM | POA: Diagnosis not present

## 2021-06-07 DIAGNOSIS — R569 Unspecified convulsions: Secondary | ICD-10-CM | POA: Diagnosis not present

## 2021-06-07 DIAGNOSIS — F331 Major depressive disorder, recurrent, moderate: Secondary | ICD-10-CM | POA: Diagnosis not present

## 2021-06-09 DIAGNOSIS — I10 Essential (primary) hypertension: Secondary | ICD-10-CM | POA: Diagnosis not present

## 2021-06-09 DIAGNOSIS — I480 Paroxysmal atrial fibrillation: Secondary | ICD-10-CM | POA: Diagnosis not present

## 2021-06-09 DIAGNOSIS — F0390 Unspecified dementia without behavioral disturbance: Secondary | ICD-10-CM | POA: Diagnosis not present

## 2021-06-09 DIAGNOSIS — F329 Major depressive disorder, single episode, unspecified: Secondary | ICD-10-CM | POA: Diagnosis not present

## 2021-06-09 DIAGNOSIS — G4089 Other seizures: Secondary | ICD-10-CM | POA: Diagnosis not present

## 2021-06-09 DIAGNOSIS — R21 Rash and other nonspecific skin eruption: Secondary | ICD-10-CM | POA: Diagnosis not present

## 2021-06-10 DIAGNOSIS — F331 Major depressive disorder, recurrent, moderate: Secondary | ICD-10-CM | POA: Diagnosis not present

## 2021-06-10 DIAGNOSIS — I4891 Unspecified atrial fibrillation: Secondary | ICD-10-CM | POA: Diagnosis not present

## 2021-06-10 DIAGNOSIS — I10 Essential (primary) hypertension: Secondary | ICD-10-CM | POA: Diagnosis not present

## 2021-06-10 DIAGNOSIS — D649 Anemia, unspecified: Secondary | ICD-10-CM | POA: Diagnosis not present

## 2021-06-10 DIAGNOSIS — R293 Abnormal posture: Secondary | ICD-10-CM | POA: Diagnosis not present

## 2021-06-10 DIAGNOSIS — H409 Unspecified glaucoma: Secondary | ICD-10-CM | POA: Diagnosis not present

## 2021-06-10 DIAGNOSIS — R569 Unspecified convulsions: Secondary | ICD-10-CM | POA: Diagnosis not present

## 2021-06-10 DIAGNOSIS — F0390 Unspecified dementia without behavioral disturbance: Secondary | ICD-10-CM | POA: Diagnosis not present

## 2021-06-10 DIAGNOSIS — E785 Hyperlipidemia, unspecified: Secondary | ICD-10-CM | POA: Diagnosis not present

## 2021-06-11 DIAGNOSIS — H409 Unspecified glaucoma: Secondary | ICD-10-CM | POA: Diagnosis not present

## 2021-06-11 DIAGNOSIS — R569 Unspecified convulsions: Secondary | ICD-10-CM | POA: Diagnosis not present

## 2021-06-11 DIAGNOSIS — D649 Anemia, unspecified: Secondary | ICD-10-CM | POA: Diagnosis not present

## 2021-06-11 DIAGNOSIS — I10 Essential (primary) hypertension: Secondary | ICD-10-CM | POA: Diagnosis not present

## 2021-06-11 DIAGNOSIS — F0390 Unspecified dementia without behavioral disturbance: Secondary | ICD-10-CM | POA: Diagnosis not present

## 2021-06-11 DIAGNOSIS — I4891 Unspecified atrial fibrillation: Secondary | ICD-10-CM | POA: Diagnosis not present

## 2021-06-12 DIAGNOSIS — D649 Anemia, unspecified: Secondary | ICD-10-CM | POA: Diagnosis not present

## 2021-06-12 DIAGNOSIS — I10 Essential (primary) hypertension: Secondary | ICD-10-CM | POA: Diagnosis not present

## 2021-06-12 DIAGNOSIS — R569 Unspecified convulsions: Secondary | ICD-10-CM | POA: Diagnosis not present

## 2021-06-12 DIAGNOSIS — H409 Unspecified glaucoma: Secondary | ICD-10-CM | POA: Diagnosis not present

## 2021-06-12 DIAGNOSIS — F0390 Unspecified dementia without behavioral disturbance: Secondary | ICD-10-CM | POA: Diagnosis not present

## 2021-06-12 DIAGNOSIS — I4891 Unspecified atrial fibrillation: Secondary | ICD-10-CM | POA: Diagnosis not present

## 2021-06-13 DIAGNOSIS — F0390 Unspecified dementia without behavioral disturbance: Secondary | ICD-10-CM | POA: Diagnosis not present

## 2021-06-13 DIAGNOSIS — R569 Unspecified convulsions: Secondary | ICD-10-CM | POA: Diagnosis not present

## 2021-06-13 DIAGNOSIS — I10 Essential (primary) hypertension: Secondary | ICD-10-CM | POA: Diagnosis not present

## 2021-06-13 DIAGNOSIS — D649 Anemia, unspecified: Secondary | ICD-10-CM | POA: Diagnosis not present

## 2021-06-13 DIAGNOSIS — I4891 Unspecified atrial fibrillation: Secondary | ICD-10-CM | POA: Diagnosis not present

## 2021-06-13 DIAGNOSIS — H409 Unspecified glaucoma: Secondary | ICD-10-CM | POA: Diagnosis not present

## 2021-06-14 DIAGNOSIS — H409 Unspecified glaucoma: Secondary | ICD-10-CM | POA: Diagnosis not present

## 2021-06-14 DIAGNOSIS — R569 Unspecified convulsions: Secondary | ICD-10-CM | POA: Diagnosis not present

## 2021-06-14 DIAGNOSIS — I4891 Unspecified atrial fibrillation: Secondary | ICD-10-CM | POA: Diagnosis not present

## 2021-06-14 DIAGNOSIS — D649 Anemia, unspecified: Secondary | ICD-10-CM | POA: Diagnosis not present

## 2021-06-14 DIAGNOSIS — I10 Essential (primary) hypertension: Secondary | ICD-10-CM | POA: Diagnosis not present

## 2021-06-14 DIAGNOSIS — F0390 Unspecified dementia without behavioral disturbance: Secondary | ICD-10-CM | POA: Diagnosis not present

## 2021-06-15 DIAGNOSIS — F329 Major depressive disorder, single episode, unspecified: Secondary | ICD-10-CM | POA: Diagnosis not present

## 2021-06-15 DIAGNOSIS — R21 Rash and other nonspecific skin eruption: Secondary | ICD-10-CM | POA: Diagnosis not present

## 2021-06-15 DIAGNOSIS — I480 Paroxysmal atrial fibrillation: Secondary | ICD-10-CM | POA: Diagnosis not present

## 2021-06-15 DIAGNOSIS — K219 Gastro-esophageal reflux disease without esophagitis: Secondary | ICD-10-CM | POA: Diagnosis not present

## 2021-06-15 DIAGNOSIS — F0390 Unspecified dementia without behavioral disturbance: Secondary | ICD-10-CM | POA: Diagnosis not present

## 2021-06-17 DIAGNOSIS — F0390 Unspecified dementia without behavioral disturbance: Secondary | ICD-10-CM | POA: Diagnosis not present

## 2021-06-17 DIAGNOSIS — I4891 Unspecified atrial fibrillation: Secondary | ICD-10-CM | POA: Diagnosis not present

## 2021-06-17 DIAGNOSIS — I10 Essential (primary) hypertension: Secondary | ICD-10-CM | POA: Diagnosis not present

## 2021-06-17 DIAGNOSIS — R569 Unspecified convulsions: Secondary | ICD-10-CM | POA: Diagnosis not present

## 2021-06-17 DIAGNOSIS — D649 Anemia, unspecified: Secondary | ICD-10-CM | POA: Diagnosis not present

## 2021-06-17 DIAGNOSIS — H409 Unspecified glaucoma: Secondary | ICD-10-CM | POA: Diagnosis not present

## 2021-06-18 DIAGNOSIS — H409 Unspecified glaucoma: Secondary | ICD-10-CM | POA: Diagnosis not present

## 2021-06-18 DIAGNOSIS — F0390 Unspecified dementia without behavioral disturbance: Secondary | ICD-10-CM | POA: Diagnosis not present

## 2021-06-18 DIAGNOSIS — I4891 Unspecified atrial fibrillation: Secondary | ICD-10-CM | POA: Diagnosis not present

## 2021-06-18 DIAGNOSIS — I10 Essential (primary) hypertension: Secondary | ICD-10-CM | POA: Diagnosis not present

## 2021-06-18 DIAGNOSIS — R569 Unspecified convulsions: Secondary | ICD-10-CM | POA: Diagnosis not present

## 2021-06-18 DIAGNOSIS — D649 Anemia, unspecified: Secondary | ICD-10-CM | POA: Diagnosis not present

## 2021-06-20 ENCOUNTER — Other Ambulatory Visit: Payer: Self-pay

## 2021-06-20 ENCOUNTER — Observation Stay (HOSPITAL_COMMUNITY)
Admission: EM | Admit: 2021-06-20 | Discharge: 2021-06-23 | Disposition: A | Discharge status: Home / Self Care | Payer: Medicare Other | Attending: Family Medicine | Admitting: Family Medicine

## 2021-06-20 DIAGNOSIS — Z79899 Other long term (current) drug therapy: Secondary | ICD-10-CM | POA: Insufficient documentation

## 2021-06-20 DIAGNOSIS — I480 Paroxysmal atrial fibrillation: Secondary | ICD-10-CM | POA: Diagnosis present

## 2021-06-20 DIAGNOSIS — Z8616 Personal history of COVID-19: Secondary | ICD-10-CM | POA: Diagnosis not present

## 2021-06-20 DIAGNOSIS — I10 Essential (primary) hypertension: Secondary | ICD-10-CM | POA: Insufficient documentation

## 2021-06-20 DIAGNOSIS — F0390 Unspecified dementia without behavioral disturbance: Secondary | ICD-10-CM | POA: Diagnosis not present

## 2021-06-20 DIAGNOSIS — R4182 Altered mental status, unspecified: Secondary | ICD-10-CM | POA: Diagnosis not present

## 2021-06-20 DIAGNOSIS — R569 Unspecified convulsions: Secondary | ICD-10-CM | POA: Diagnosis not present

## 2021-06-20 DIAGNOSIS — I959 Hypotension, unspecified: Secondary | ICD-10-CM | POA: Diagnosis not present

## 2021-06-20 DIAGNOSIS — R059 Cough, unspecified: Secondary | ICD-10-CM | POA: Diagnosis not present

## 2021-06-20 DIAGNOSIS — D508 Other iron deficiency anemias: Secondary | ICD-10-CM | POA: Diagnosis not present

## 2021-06-20 DIAGNOSIS — Z515 Encounter for palliative care: Secondary | ICD-10-CM

## 2021-06-20 DIAGNOSIS — R7889 Finding of other specified substances, not normally found in blood: Secondary | ICD-10-CM | POA: Diagnosis not present

## 2021-06-20 DIAGNOSIS — F0391 Unspecified dementia with behavioral disturbance: Secondary | ICD-10-CM | POA: Diagnosis not present

## 2021-06-20 DIAGNOSIS — Z66 Do not resuscitate: Secondary | ICD-10-CM | POA: Diagnosis not present

## 2021-06-20 DIAGNOSIS — D649 Anemia, unspecified: Secondary | ICD-10-CM | POA: Diagnosis not present

## 2021-06-20 LAB — RETICULOCYTES
Immature Retic Fract: 13.6 % (ref 2.3–15.9)
RBC.: 3.22 MIL/uL — ABNORMAL LOW (ref 3.87–5.11)
Retic Count, Absolute: 39.3 10*3/uL (ref 19.0–186.0)
Retic Ct Pct: 1.2 % (ref 0.4–3.1)

## 2021-06-20 LAB — POC OCCULT BLOOD, ED: Fecal Occult Bld: NEGATIVE

## 2021-06-20 LAB — TYPE AND SCREEN
ABO/RH(D): B POS
Antibody Screen: NEGATIVE

## 2021-06-20 MED ORDER — DONEPEZIL HCL 5 MG PO TABS
5.0000 mg | ORAL_TABLET | Freq: Every day | ORAL | Status: DC
  Administered 2021-06-21 – 2021-06-22 (×2): 5 mg via ORAL
  Filled 2021-06-20 (×4): qty 1

## 2021-06-20 MED ORDER — HYDROCODONE-ACETAMINOPHEN 5-325 MG PO TABS
1.0000 | ORAL_TABLET | Freq: Four times a day (QID) | ORAL | Status: DC | PRN
  Administered 2021-06-22: 1 via ORAL
  Filled 2021-06-20: qty 1

## 2021-06-20 MED ORDER — LATANOPROST 0.005 % OP SOLN
1.0000 [drp] | Freq: Every day | OPHTHALMIC | Status: DC
  Administered 2021-06-21 – 2021-06-22 (×2): 1 [drp] via OPHTHALMIC
  Filled 2021-06-20: qty 2.5

## 2021-06-20 MED ORDER — LEVETIRACETAM 500 MG PO TABS
500.0000 mg | ORAL_TABLET | Freq: Two times a day (BID) | ORAL | Status: DC
  Administered 2021-06-20 – 2021-06-23 (×6): 500 mg via ORAL
  Filled 2021-06-20 (×6): qty 1

## 2021-06-20 MED ORDER — TIMOLOL MALEATE 0.5 % OP SOLN
1.0000 [drp] | Freq: Every day | OPHTHALMIC | Status: DC
  Administered 2021-06-21 – 2021-06-23 (×3): 1 [drp] via OPHTHALMIC
  Filled 2021-06-20: qty 5

## 2021-06-20 MED ORDER — PANTOPRAZOLE SODIUM 40 MG PO TBEC
40.0000 mg | DELAYED_RELEASE_TABLET | Freq: Every day | ORAL | Status: DC
  Administered 2021-06-21 – 2021-06-23 (×3): 40 mg via ORAL
  Filled 2021-06-20 (×3): qty 1

## 2021-06-20 MED ORDER — SODIUM CHLORIDE 0.9% FLUSH
3.0000 mL | Freq: Two times a day (BID) | INTRAVENOUS | Status: DC
  Administered 2021-06-20 – 2021-06-22 (×4): 3 mL via INTRAVENOUS

## 2021-06-20 MED ORDER — TRAZODONE HCL 50 MG PO TABS
25.0000 mg | ORAL_TABLET | Freq: Every evening | ORAL | Status: DC
  Administered 2021-06-20 – 2021-06-22 (×3): 25 mg via ORAL
  Filled 2021-06-20 (×3): qty 1

## 2021-06-20 MED ORDER — CALCITONIN (SALMON) 200 UNIT/ACT NA SOLN
1.0000 | Freq: Every day | NASAL | Status: DC
  Administered 2021-06-22 – 2021-06-23 (×2): 1 via NASAL
  Filled 2021-06-20: qty 3.7

## 2021-06-20 MED ORDER — VITAMIN D 25 MCG (1000 UNIT) PO TABS
1000.0000 [IU] | ORAL_TABLET | Freq: Every day | ORAL | Status: DC
  Administered 2021-06-21 – 2021-06-23 (×3): 1000 [IU] via ORAL
  Filled 2021-06-20 (×3): qty 1

## 2021-06-20 MED ORDER — PREGABALIN 25 MG PO CAPS
50.0000 mg | ORAL_CAPSULE | Freq: Two times a day (BID) | ORAL | Status: DC
  Administered 2021-06-20 – 2021-06-23 (×6): 50 mg via ORAL
  Filled 2021-06-20 (×6): qty 2

## 2021-06-20 MED ORDER — MORPHINE SULFATE (PF) 2 MG/ML IV SOLN
2.0000 mg | Freq: Once | INTRAVENOUS | Status: AC
  Administered 2021-06-20: 2 mg via INTRAVENOUS
  Filled 2021-06-20: qty 1

## 2021-06-20 MED ORDER — ACETAMINOPHEN 650 MG RE SUPP: 650.0000 mg | Freq: Four times a day (QID) | RECTAL | Status: DC | PRN

## 2021-06-20 MED ORDER — ACETAMINOPHEN 325 MG PO TABS: 650.0000 mg | ORAL_TABLET | Freq: Four times a day (QID) | ORAL | Status: DC | PRN

## 2021-06-20 MED ORDER — CALCIUM CARBONATE 1250 (500 CA) MG PO TABS
500.0000 mg | ORAL_TABLET | Freq: Every day | ORAL | Status: DC
  Administered 2021-06-21 – 2021-06-23 (×3): 500 mg via ORAL
  Filled 2021-06-20 (×2): qty 1

## 2021-06-20 MED ORDER — DIGOXIN 125 MCG PO TABS
0.1250 mg | ORAL_TABLET | ORAL | Status: DC
  Administered 2021-06-22: 0.125 mg via ORAL
  Filled 2021-06-20: qty 1

## 2021-06-20 MED ORDER — DULOXETINE HCL 30 MG PO CPEP
30.0000 mg | ORAL_CAPSULE | Freq: Every day | ORAL | Status: DC
  Administered 2021-06-21 – 2021-06-23 (×3): 30 mg via ORAL
  Filled 2021-06-20 (×4): qty 1

## 2021-06-20 NOTE — ED Triage Notes (Signed)
Pt BIB EMS - routine labs @ [facility] showed Hgb 6.4, sent to ED for eval.

## 2021-06-20 NOTE — H&P (Signed)
History and Physical   Kendra Smith MHD:[id number] DOB: 07-26-1926 DOA: 06/20/2021  PCP: Chilton Greathouse, MD   Patient coming from: [facility]  Chief Complaint: Abnormal labs, low hemoglobin  HPI: Kendra Smith is a 87 y.o. female with medical history significant of A. fib, seizure, CKD 3A, dementia, depression, GI bleed, hyperlipidemia, hypertension who presents with abnormal lab value from her facility. As above patient was sent from her facility with an abnormal lab value noted to have hemoglobin of 6.4.  History obtained with assistance of family chart review due to patient's dementia.  She had been having some abdominal pain and nausea earlier and her labs were checked because of this.  Found to have a hemoglobin of 6.4 and sent to the ED for further evaluation.  Spoke with her daughter [name] by phone to clarify discussion on palliative versus end-of-life/hospice care.  Daughter, who is POA, states that patient had discussed which with her before her dementia had sent in and she is a DNR and DNI.  Daughter states she would also not want procedures at this point in her mother's life including any endoscopic evaluation by GI.  She also states that if the hemoglobin continues to drop she would not want her mother to receive any blood transfusions and she would be allowed to pass naturally and pursue hospice care in that setting.  She does state however that if her mother is found to have a deficiency in iron or folate or B12 she would like supplementation to see if her mother's hemoglobin is able to recover in that setting.  She is not currently advocating for full comfort measures/hospice care as she wants her to continue to receive home medications, IV fluids, antibiotics if needed.  But does not want as above ACLS intervention nor procedure nor transfusions.  ED Course: Vital signs in the ED significant for blood pressure in the 110s to 150s systolic.  Lab work-up including CMP  and CBC is still pending.  FOBT in the ED was negative.  Patient type and screen also ordered.  No imaging ordered in the ED.  Patient was ordered a dose of morphine in the ED.  Review of Systems Unable to be obtained due to patient's dementia.  Past Medical History:  Diagnosis Date   Dementia (HCC)    Depression    Glaucoma    Hyperlipidemia    Hypertension    Osteoporosis     Past Surgical History:  Procedure Laterality Date   ABDOMINAL HYSTERECTOMY     ANKLE ARTHROSCOPY     CHOLECYSTECTOMY     EYE SURGERY     RECTOCELE REPAIR      Social History  reports that she has never smoked. She has never used smokeless tobacco. She reports that she does not drink alcohol and does not use drugs.  Allergies  Allergen Reactions   Ciprofloxacin Hcl Other (See Comments)    On MAR   Effexor [Venlafaxine] Other (See Comments)    On MAR   Gantrisin [Sulfisoxazole] Other (See Comments)    On MAR   Levaquin [Levofloxacin] Other (See Comments)    On MAR   Sulfa Antibiotics Rash    No family history on file.  Prior to Admission medications   Medication Sig Start Date End Date Taking? Authorizing Provider  acetaminophen (TYLENOL) 500 MG tablet Take 1,000 mg by mouth every 6 (six) hours as needed for mild pain, fever or headache.     [provider]  calcitonin, salmon, (MIACALCIN/FORTICAL) 200 UNIT/ACT nasal spray Place 1 spray into alternate nostrils daily.    [provider]  calcium carbonate (OSCAL) 1500 (600 Ca) MG TABS tablet Take 600 mg of elemental calcium by mouth daily with breakfast.    [provider]  cholecalciferol (VITAMIN D3) 25 MCG (1000 UT) tablet Take 1,000 Units by mouth daily with breakfast.     [provider]  DESITIN 40 % PSTE Apply 1 application topically in the morning and at bedtime. 11/05/20   [provider]  digoxin (LANOXIN) 0.125 MG tablet Take 0.125 mg by mouth every other day.     [provider]   donepezil (ARICEPT) 5 MG tablet Take 5 mg by mouth at bedtime. 10/15/18   [provider]  DULoxetine (CYMBALTA) 30 MG capsule Take 30 mg by mouth daily. 12/11/20   [provider]  HYDROcodone-acetaminophen (NORCO/VICODIN) 5-325 MG tablet Take 1-2 tablets by mouth every 6 (six) hours as needed. 01/07/21   Merlene LaughterSheikh, Omair Latif, DO  Infant Care Products [facility](DERMACLOUD [place]) Apply 1 application topically See admin instructions. Dermacloud ointment- Apply to buttocks after each incontinent episode    [provider]  Infant Care Products [facility](DERMACLOUD) OINT Apply 1 application topically in the morning and at bedtime. 10/09/20   [provider]  lactose free nutrition (BOOST) LIQD Take 237 mLs by mouth 2 (two) times daily between meals.    [provider]  latanoprost (XALATAN) 0.005 % ophthalmic solution Place 1 drop into both eyes at bedtime.    [provider]  levETIRAcetam (KEPPRA) 500 MG tablet Take 500 mg by mouth 2 (two) times daily.    [provider]  NON FORMULARY Take 120 mLs by mouth See admin instructions. MedPass- Drink 120 ml's by mouth two times a day    [provider]  Nystatin (GERHARDT'S BUTT CREAM) CREA Apply 1 application topically 2 (two) times daily. 01/07/21   Marguerita MerlesSheikh, Omair Latif, DO  ondansetron (ZOFRAN) 4 MG tablet Take 1 tablet (4 mg total) by mouth every 6 (six) hours as needed for nausea. 01/07/21   Marguerita MerlesSheikh, Omair Latif, DO  pantoprazole (PROTONIX) 40 MG tablet Take 40 mg by mouth daily.    [provider]  pregabalin (LYRICA) 50 MG capsule Take 50 mg by mouth 2 (two) times daily.    [provider]  saccharomyces boulardii (FLORASTOR) 250 MG capsule Take 1 capsule (250 mg total) by mouth 2 (two) times daily. 01/07/21   Marguerita MerlesSheikh, Omair Latif, DO  timolol (TIMOPTIC) 0.5 % ophthalmic solution Place 1 drop into both eyes daily. 06/15/14   [provider]  traMADol (ULTRAM) 50 MG tablet Take 1  tablet (50 mg total) by mouth every 8 (eight) hours as needed for severe pain. 01/07/21   Marguerita MerlesSheikh, Omair Latif, DO  traZODone (DESYREL) 50 MG tablet Take 25 mg by mouth every evening.  10/15/18   [provider]    Physical Exam: Vitals:   06/20/21 1945 06/20/21 2030 06/20/21 2045 06/20/21 2100  BP: (!) 159/60 (!) 171/65 (!) 157/61 (!) 158/63  Pulse: 80 87 82 82  Resp: 17 17 15 17   Temp:      TempSrc:      SpO2: 98% 100% 99% 96%  Weight:      Height:       Physical Exam Constitutional:      General: She is not in acute distress.    Appearance: Normal appearance.     Comments: Pleasantly  demented frail-appearing elderly female.  HENT:     Head: Normocephalic and atraumatic.     Mouth/Throat:     Mouth: Mucous membranes are moist.     Pharynx: Oropharynx is clear.  Eyes:     Extraocular Movements: Extraocular movements intact.     Pupils: Pupils are equal, round, and reactive to light.  Cardiovascular:     Rate and Rhythm: Normal rate and regular rhythm.     Pulses: Normal pulses.     Heart sounds: Normal heart sounds.  Pulmonary:     Effort: Pulmonary effort is normal. No respiratory distress.     Breath sounds: Normal breath sounds.  Abdominal:     General: Bowel sounds are normal. There is no distension.     Palpations: Abdomen is soft.     Tenderness: There is no abdominal tenderness.  Musculoskeletal:        General: No swelling or deformity.  Skin:    General: Skin is warm and dry.     Coloration: Skin is pale.  Neurological:     General: No focal deficit present.     Mental Status: Mental status is at baseline.     Comments: Alert to voice.  Oriented to self and hospital only not situation or year.    Labs on Admission: I have personally reviewed following labs and imaging studies  CBC: No results for input(s): WBC, NEUTROABS, HGB, HCT, MCV, PLT in the last 168 hours.  Basic Metabolic Panel: No results for input(s): NA, K, CL, CO2, GLUCOSE, BUN,  CREATININE, CALCIUM, MG, PHOS in the last 168 hours.  GFR: CrCl cannot be calculated (Patient's most recent lab result is older than the maximum 21 days allowed.).  Liver Function Tests: No results for input(s): AST, ALT, ALKPHOS, BILITOT, PROT, ALBUMIN in the last 168 hours.  Urine analysis:    Component Value Date/Time   COLORURINE YELLOW 01/03/2021 1851   APPEARANCEUR HAZY (A) 01/03/2021 1851   LABSPEC 1.020 01/03/2021 1851   PHURINE 5.0 01/03/2021 1851   GLUCOSEU NEGATIVE 01/03/2021 1851   HGBUR NEGATIVE 01/03/2021 1851   BILIRUBINUR NEGATIVE 01/03/2021 1851   KETONESUR NEGATIVE 01/03/2021 1851   PROTEINUR 30 (A) 01/03/2021 1851   UROBILINOGEN 1.0 06/22/2011 1224   NITRITE NEGATIVE 01/03/2021 1851   LEUKOCYTESUR MODERATE (A) 01/03/2021 1851    Radiological Exams on Admission: No results found.  EKG: Independently reviewed.  Sinus rhythm at 79 bpm.  Right bundle branch block.  LVH with repolarization abnormality.  Assessment/Plan Principal Problem:   Anemia Active Problems:   Dementia (HCC)   PAF (paroxysmal atrial fibrillation) (HCC)   DNR (do not resuscitate)   Seizure (HCC)  Anemia History of GI bleed > Sent to the ED due to a hemoglobin of 6.4 at her facility. > Awaiting confirmation with labs here which are still pending. > Detailed discussion with daughter as per HPI.  Essentially patient is DNR and DNI, would not want her mother to have any procedures including endoscopy, it would not want her mother to have any transfusions.  Would want deficiency work-up to be done and if found to be deficient is okay with supplementation of iron, folate, B12. -Monitor on telemetry for any signs of worsening anemia, would want to contact daughter if anemia accelerated. - Trend hemoglobin - Check iron, folate, B12, reticulocytes, ferritin - Consult to palliative care -Patient comfortable continue with home pain medications  History of GI bleed - No diagnosis of GERD  however remains on PPI  Atrial fibrillation - Continue home digoxin   Seizure Disorder - Continue home Keppra  Dementia Depression - Continue home donepezil and Lyrica - Continue home trazodone  DVT prophylaxis: SCDs  Code Status:   DNR/DNI  Family Communication:  Daughter updated by phone extensive discussion as per HPI.   Disposition Plan:   Patient is from:  [facility]  Anticipated DC to:  [facility]  Anticipated DC date:  1 to 2 days  Anticipated DC barriers: None  Consults called:  Order placed for palliative care consult  Admission status:  Observation, telemetry   Severity of Illness: The appropriate patient status for this patient is OBSERVATION. Observation status is judged to be reasonable and necessary in order to provide the required intensity of service to ensure the patient's safety. The patient's presenting symptoms, physical exam findings, and initial radiographic and laboratory data in the context of their medical condition is felt to place them at decreased risk for further clinical deterioration. Furthermore, it is anticipated that the patient will be medically stable for discharge from the hospital within 2 midnights of admission. The following factors support the patient status of observation.   " The patient's presenting symptoms include anemia. " The physical exam findings include dementia, pale skin. " The initial radiographic and laboratory data are ab work-up including CMP and CBC is still pending.  FOBT in the ED was negative.  Patient type and screen also ordered.  No imaging ordered in the ED.   Synetta Fail MD [facility]  How to contact the [facility] Attending or Consulting provider 7A - 7P or covering provider during after hours 7P -7A, for this patient?   Check the care team in [facility] and look for a) attending/consulting [facility] provider listed and b) the [facility] team listed Log into [email or web address] and use [facility] universal password to  access. If you do not have the password, please contact the hospital operator. Locate the [facility] provider you are looking for under [facility] and page to a number that you can be directly reached. If you still have difficulty reaching the provider, please page the [facility] (Director on Call) for the Hospitalists listed on amion for assistance.  06/20/2021, 10:08 PM

## 2021-06-20 NOTE — ED Provider Notes (Signed)
[facility] EMERGENCY DEPARTMENT Provider Note   CSN: [id number] Arrival date & time: 06/20/21  1826     History Chief Complaint  Patient presents with   Abnormal Lab    Kendra Smith is a 87 y.o. female. Level 5 caveat secondary to dementia HPI 87 year old female history of dementia and, hypertension, hyperlipidemia who presents today from [facility] facility.  They report that she had labs checked today and has a hemoglobin of 6.4.  Much of the history was obtained from her daughter via telephone.  Her daughter's name is [name], she is the only surviving child and has patient's power of attorney.  I reached her on her cell phone at [phone].  Patient has been DNR and straightforward with her.  Her daughter states that she had lengthy discussions with the patient prior to the onset of dementia and these were her mother's wishes.  She states that her mother had some abdominal pain and vomiting earlier today.  Labs were obtained secondary to this.  Reported low hemoglobin and sent her to the emergency department.     Past Medical History:  Diagnosis Date   Dementia (HCC)    Depression    Glaucoma    Hyperlipidemia    Hypertension    Osteoporosis     Patient Active Problem List   Diagnosis Date Noted   Pressure injury of skin 01/04/2021   Acute renal failure superimposed on stage 3a chronic kidney disease (HCC) 01/03/2021   Severe sepsis (HCC) 01/03/2021   Metabolic acidosis 01/03/2021   Acute metabolic encephalopathy 01/03/2021   Seizure (HCC) 05/28/2020   Weakness generalized    GI bleed 08/30/2019   UTI (urinary tract infection) 08/30/2019   Hypokalemia 08/30/2019   Demand ischemia (HCC) 08/30/2019   Palliative care by specialist    COVID-19    DNR (do not resuscitate)    Adult failure to thrive    Left acetabular fracture (HCC) 08/23/2019   Pelvic rami fracture (HCC) 08/23/2019   PAF (paroxysmal atrial fibrillation) (HCC) 08/23/2019    Rib fracture-12 01/12/2019   Fracture of transverse process of lumbar vertebra L1-L2 01/12/2019   Dementia (HCC) 01/12/2019   Fall 01/12/2019   Leukocytosis 01/12/2019   Depression    Hypertension     Past Surgical History:  Procedure Laterality Date   ABDOMINAL HYSTERECTOMY     ANKLE ARTHROSCOPY     CHOLECYSTECTOMY     EYE SURGERY     RECTOCELE REPAIR       OB History   No obstetric history on file.     No family history on file.  Social History   Tobacco Use   Smoking status: Never   Smokeless tobacco: Never  Vaping Use   Vaping Use: Never used  Substance Use Topics   Alcohol use: No   Drug use: No    Home Medications Prior to Admission medications   Medication Sig Start Date End Date Taking? Authorizing Provider  acetaminophen (TYLENOL) 500 MG tablet Take 1,000 mg by mouth every 6 (six) hours as needed for mild pain, fever or headache.     [provider]  calcitonin, salmon, (MIACALCIN/FORTICAL) 200 UNIT/ACT nasal spray Place 1 spray into alternate nostrils daily.    [provider]  calcium carbonate (OSCAL) 1500 (600 Ca) MG TABS tablet Take 600 mg of elemental calcium by mouth daily with breakfast.    [provider]  cholecalciferol (VITAMIN D3) 25 MCG (1000 UT) tablet Take 1,000 Units  by mouth daily with breakfast.     [provider]  DESITIN 40 % PSTE Apply 1 application topically in the morning and at bedtime. 11/05/20   [provider]  digoxin (LANOXIN) 0.125 MG tablet Take 0.125 mg by mouth every other day.     [provider]  donepezil (ARICEPT) 5 MG tablet Take 5 mg by mouth at bedtime. 10/15/18   [provider]  DULoxetine (CYMBALTA) 30 MG capsule Take 30 mg by mouth daily. 12/11/20   [provider]  HYDROcodone-acetaminophen (NORCO/VICODIN) 5-325 MG tablet Take 1-2 tablets by mouth every 6 (six) hours as needed. 01/07/21   Merlene Laughter, DO  Infant Care Products  [facility] [place]) Apply 1 application topically See admin instructions. Dermacloud ointment- Apply to buttocks after each incontinent episode    [provider]  Infant Care Products [facility]) OINT Apply 1 application topically in the morning and at bedtime. 10/09/20   [provider]  lactose free nutrition (BOOST) LIQD Take 237 mLs by mouth 2 (two) times daily between meals.    [provider]  latanoprost (XALATAN) 0.005 % ophthalmic solution Place 1 drop into both eyes at bedtime.    [provider]  levETIRAcetam (KEPPRA) 500 MG tablet Take 500 mg by mouth 2 (two) times daily.    [provider]  NON FORMULARY Take 120 mLs by mouth See admin instructions. MedPass- Drink 120 ml's by mouth two times a day    [provider]  Nystatin (GERHARDT'S BUTT CREAM) CREA Apply 1 application topically 2 (two) times daily. 01/07/21   Marguerita Merles Latif, DO  ondansetron (ZOFRAN) 4 MG tablet Take 1 tablet (4 mg total) by mouth every 6 (six) hours as needed for nausea. 01/07/21   Marguerita Merles Latif, DO  pantoprazole (PROTONIX) 40 MG tablet Take 40 mg by mouth daily.    [provider]  pregabalin (LYRICA) 50 MG capsule Take 50 mg by mouth 2 (two) times daily.    [provider]  saccharomyces boulardii (FLORASTOR) 250 MG capsule Take 1 capsule (250 mg total) by mouth 2 (two) times daily. 01/07/21   Marguerita Merles Latif, DO  timolol (TIMOPTIC) 0.5 % ophthalmic solution Place 1 drop into both eyes daily. 06/15/14   [provider]  traMADol (ULTRAM) 50 MG tablet Take 1 tablet (50 mg total) by mouth every 8 (eight) hours as needed for severe pain. 01/07/21   Marguerita Merles Latif, DO  traZODone (DESYREL) 50 MG tablet Take 25 mg by mouth every evening.  10/15/18   [provider]    Allergies    Ciprofloxacin hcl, Effexor [venlafaxine], Gantrisin [sulfisoxazole], Levaquin [levofloxacin], and Sulfa antibiotics  Review of Systems    Review of Systems  Unable to perform ROS: Dementia   Physical Exam Updated Vital Signs BP (!) 159/60   Pulse 80   Temp 97.6 F (36.4 C) (Oral)   Resp 17   Ht 1.524 m (5')   Wt 50 kg   SpO2 98%   BMI 21.53 kg/m   Physical Exam Vitals and nursing note reviewed.  Constitutional:      Appearance: Normal appearance.  HENT:     Head: Normocephalic.     Right Ear: External ear normal.     Left Ear: External ear normal.     Nose: Nose normal.     Mouth/Throat:     Pharynx: Oropharynx is clear.  Eyes:     Extraocular Movements: Extraocular movements intact.  Pupils: Pupils are equal, round, and reactive to light.     Comments: Conjunctive are pale  Cardiovascular:     Rate and Rhythm: Normal rate and regular rhythm.  Pulmonary:     Effort: Pulmonary effort is normal.     Breath sounds: Normal breath sounds.  Abdominal:     General: Abdomen is flat.     Palpations: Abdomen is soft.     Tenderness: There is abdominal tenderness.     Comments: Some tenderness in in left lower quadrant  Genitourinary:    Rectum: Normal. Guaiac result negative.  Musculoskeletal:        General: No deformity. Normal range of motion.     Cervical back: Normal range of motion.  Skin:    Capillary Refill: Capillary refill takes less than 2 seconds.     Coloration: Skin is pale.  Neurological:     General: No focal deficit present.     Mental Status: She is alert.  Psychiatric:        Attention and Perception: She is inattentive.        Mood and Affect: Mood normal.        Speech: Speech normal.        Behavior: Behavior normal.        Cognition and Memory: Cognition is impaired. Memory is impaired.    ED Results / Procedures / Treatments   Labs (all labs ordered are listed, but only abnormal results are displayed) Labs Reviewed  CBC  COMPREHENSIVE METABOLIC PANEL  POC OCCULT BLOOD, ED  TYPE AND SCREEN    EKG None  Radiology No results found.  Procedures Procedures    Medications Ordered in ED Medications  morphine 2 MG/ML injection 2 mg (has no administration in time range)    ED Course  I have reviewed the triage vital signs and the nursing notes.  Pertinent labs & imaging results that were available during my care of the patient were reviewed by me and considered in my medical decision making (see chart for details).    MDM Rules/Calculators/A&P                           87 year old female who is comfort care.  She had some nausea vomiting and abdominal pain today.  Labs were obtained at her facility and she was sent to the ED with reports of low hemoglobin.  I had an extensive conversation with her daughter Iona CoachLinda Long.  She confirms that the patient's wishes were to have no aggressive care.  She has had discussions with palliative care and her understanding is that the patient is to be treated with palliative care. Here in the ED the patient does have some left lower quadrant pain appears somewhat uncomfortable with palpation.  Her hemoglobin was reported to be low and we will obtain a repeat CBC, although the daughter is in agreement that we should not be transfusing her.  Additionally, she does not wish any aggressive intervention, therefore do not feel that sitting with the CT scan is in the patient's best interest.  Daughter wishes her mother to be made comfort care.  She is being given 2 mg of morphine here in the ED.  Plan consult to medicine service for mission and pain medication tonight.  Plan discussion with hospice for tomorrow and probable discharge back to her facility with hospice care in place. Final Clinical Impression(s) / ED Diagnoses Final diagnoses:  Anemia, unspecified  type  Hospice care    Rx / DC Orders ED Discharge Orders     None        Margarita Grizzle, MD 06/25/21 [phone]

## 2021-06-21 DIAGNOSIS — Z7189 Other specified counseling: Secondary | ICD-10-CM | POA: Diagnosis not present

## 2021-06-21 DIAGNOSIS — Z515 Encounter for palliative care: Secondary | ICD-10-CM

## 2021-06-21 DIAGNOSIS — F0390 Unspecified dementia without behavioral disturbance: Secondary | ICD-10-CM

## 2021-06-21 DIAGNOSIS — Z66 Do not resuscitate: Secondary | ICD-10-CM | POA: Diagnosis not present

## 2021-06-21 DIAGNOSIS — D649 Anemia, unspecified: Secondary | ICD-10-CM | POA: Diagnosis not present

## 2021-06-21 LAB — COMPREHENSIVE METABOLIC PANEL
ALT: 8 U/L (ref 0–44)
AST: 14 U/L — ABNORMAL LOW (ref 15–41)
Albumin: 2.8 g/dL — ABNORMAL LOW (ref 3.5–5.0)
Alkaline Phosphatase: 76 U/L (ref 38–126)
Anion gap: 9 (ref 5–15)
BUN: 21 mg/dL (ref 8–23)
CO2: 28 mmol/L (ref 22–32)
Calcium: 9.4 mg/dL (ref 8.9–10.3)
Chloride: 102 mmol/L (ref 98–111)
Creatinine, Ser: 0.83 mg/dL (ref 0.44–1.00)
GFR, Estimated: >60 mL/min (ref >60)
Glucose, Bld: 90 mg/dL (ref 70–99)
Potassium: 3.7 mmol/L (ref 3.5–5.1)
Sodium: 139 mmol/L (ref 135–145)
Total Bilirubin: 0.2 mg/dL — ABNORMAL LOW (ref 0.3–1.2)
Total Protein: 7.2 g/dL (ref 6.5–8.1)

## 2021-06-21 LAB — CBC
HCT: 26.6 % — ABNORMAL LOW (ref 36.0–46.0)
Hemoglobin: 7 g/dL — ABNORMAL LOW (ref 12.0–15.0)
MCH: 20.5 pg — ABNORMAL LOW (ref 26.0–34.0)
MCHC: 26.3 g/dL — ABNORMAL LOW (ref 30.0–36.0)
MCV: 78 fL — ABNORMAL LOW (ref 80.0–100.0)
Platelets: 377 10*3/uL (ref 150–400)
RBC: 3.41 MIL/uL — ABNORMAL LOW (ref 3.87–5.11)
RDW: 17 % — ABNORMAL HIGH (ref 11.5–15.5)
WBC: 7.4 10*3/uL (ref 4.0–10.5)
nRBC: 0 % (ref 0.0–0.2)

## 2021-06-21 LAB — IRON AND TIBC
Iron: 19 ug/dL — ABNORMAL LOW (ref 28–170)
Saturation Ratios: 4 % — ABNORMAL LOW (ref 10.4–31.8)
TIBC: 431 ug/dL (ref 250–450)
UIBC: 412 ug/dL

## 2021-06-21 LAB — VITAMIN B12: Vitamin B-12: 173 pg/mL — ABNORMAL LOW (ref 180–914)

## 2021-06-21 LAB — FERRITIN: Ferritin: 5 ng/mL — ABNORMAL LOW (ref 11–307)

## 2021-06-21 LAB — FOLATE: Folate: 15.4 ng/mL (ref >5.9)

## 2021-06-21 NOTE — Consult Note (Signed)
Consultation Note Date: 06/21/2021   Patient Name: Kendra Smith  DOB: [birth year]  MRN: [id number]  Age / Sex: 87 y.o., female  PCP: Prince Solian, MD Referring Physician: Marcelyn Bruins, MD  Reason for Consultation: Establishing goals of care  HPI/Patient Profile: 87 y.o. female  with past medical history of fib, seizure, CKD 3A, dementia, depression, GI bleed, hyperlipidemia, hypertension admitted on 06/20/2021 with Hb of 6.4 at [facility].  Palliative medicine has been consulted to assist with goals of care conversation.   Clinical Assessment and Goals of Care:  I have reviewed medical records including EPIC notes, labs and imaging, received report from RN, assessed the patient and then met at the bedside along with patient's daughter Kendra Smith to discuss diagnosis prognosis, [place], EOL wishes, disposition and options.  I introduced Palliative Medicine as specialized medical care for people living with serious illness. It focuses on providing relief from the symptoms and stress of a serious illness. The goal is to improve quality of life for both the patient and the family.  We discussed a brief life review of the patient and then focused on their current illness. The natural disease trajectory and expectations at EOL were discussed. Kendra Smith shares that the patient is widowed and patient's son is also deceased. Patient previously enjoyed reading, gardening/landscaping, and [place] home. She has not participated in these activities much since her dementia diagnosis over 10 years ago. She currently uses a wheelchair to ambulate, but often tries to get out of bed and has had a few falls at [facility]. She has resided there for about 2 years and transitioned to long-term care 1 year ago. She has worked with SLP and Kendra Smith tries to visit during lunch to assist with feeding as often as she can.  Patient still recognizes her daughter and enjoys eating her meals. She sleeps most of the day but does not appear to be in pain or distress at this point in her illness. Kendra Smith understands the terminal nature of patient's dementia and that her anemia is likely caused by a GI or other internal bleed. She states "it is in God's hands" and she is prepared to support the patient and face treatment options with her best interests in mind.     I attempted to elicit values and goals of care important to the patient.   Kendra Smith and the patient have had many conversations about her wishes and preferences for approaching end of life. She would not want aggressive interventions or procedures. She would not want to spend her time going back and forth to the hospital. Kendra Smith feels that if reversible causes of patient's anemia are discovered after work-up, such as iron/folate/B12 deficiencies, or other electrolyte disturbances that could be treated with medications and supplementations, this would be acceptable to the patient. Kendra Smith would not feel comfortable with repeating blood transfusions if a cause is not identified with lab work. Her goal is to minimize patient's time away from her familiar environment at [facility].  The difference between  aggressive medical intervention and comfort care was considered in light of the patient's goals of care.   Advanced directives, concepts specific to code status, artifical feeding and hydration, and rehospitalization were considered and discussed.  Hospice and Palliative Care services outpatient were explained and offered.  Discussed the importance of continued conversation with family and the medical providers regarding overall plan of care and treatment options, ensuring decisions are within the context of the patient's values and GOCs.    Questions and concerns were addressed.  Hard Choices booklet left for review. The family was encouraged to call with questions or  concerns.  PMT will continue to support holistically.   NEXT OF KIN is patient's daughter Kendra Smith. No HCPOA on file.    SUMMARY OF RECOMMENDATIONS   -DNR/DNI confirmed -Continue current care plan, NO procedures or aggressive interventions -NO blood transfusions if a cause of anemia is not identified with lab work  -Patient's daughter Kendra Smith would prefer to avoid hospital admission. She is willing to try vitamin supplements, medications, fluids, and other interventions that could be administered to patient in observation status -Goal is to return to [facility] as soon as possible with either continued [facility] outpatient palliative care or initiation of hospice services (would prefer today pending lab results) -Psychosocial and emotional support provided  -Ongoing support from PMT  Code Status/Advance Care Planning: DNR  Palliative Prophylaxis:  Aspiration and Delirium Protocol  Additional Recommendations (Limitations, Scope, Preferences): Avoid Hospitalization, No Blood Transfusions, and No Surgical Procedures  Psycho-social/Spiritual:  Desire for further Chaplaincy support: not at this time Additional Recommendations: Education on Hospice and Referral to [employer]   Prognosis:  Poor long-term prognosis given advanced dementia, multiple comorbidities, functional decline  Discharge Planning:  SNF with either [facility] palliative or hospice       Primary Diagnoses: Present on Admission:  Anemia  Dementia ([place])  DNR (do not resuscitate)  PAF (paroxysmal atrial fibrillation) ([place])   I have reviewed the medical record, interviewed the patient and family, and examined the patient. The following aspects are pertinent.  Past Medical History:  Diagnosis Date   Dementia ([place])    Depression    Glaucoma    Hyperlipidemia    Hypertension    Osteoporosis    Social History   Socioeconomic History   Marital status: Widowed    Spouse name: Not on file   Number of children: Not  on file   Years of education: Not on file   Highest education level: Not on file  Occupational History   Not on file  Tobacco Use   Smoking status: Never   Smokeless tobacco: Never  Vaping Use   Vaping Use: Never used  Substance and Sexual Activity   Alcohol use: No   Drug use: No   Sexual activity: Not on file  Other Topics Concern   Not on file  Social History Narrative   Not on file   Social Determinants of Health   Financial Resource Strain: Not on file  Food Insecurity: Not on file  Transportation Needs: Not on file  Physical Activity: Not on file  Stress: Not on file  Social Connections: Not on file   No family history on file. Scheduled Meds:  calcitonin (salmon)  1 spray Alternating Nares Daily   calcium carbonate  500 mg of elemental calcium Oral Q breakfast   cholecalciferol  1,000 Units Oral Daily   [START ON 06/22/2021] digoxin  0.125 mg Oral QODAY   donepezil  5 mg Oral  QHS   DULoxetine  30 mg Oral Daily   latanoprost  1 drop Both Eyes QHS   levETIRAcetam  500 mg Oral BID   pantoprazole  40 mg Oral Daily   pregabalin  50 mg Oral BID   sodium chloride flush  3 mL Intravenous Q12H   timolol  1 drop Both Eyes Daily   traZODone  25 mg Oral QPM   Continuous Infusions: PRN Meds:.acetaminophen **OR** acetaminophen, HYDROcodone-acetaminophen Medications Prior to Admission:  Prior to Admission medications   Medication Sig Start Date End Date Taking? Authorizing Provider  acetaminophen (TYLENOL) 500 MG tablet Take 1,000 mg by mouth every 6 (six) hours as needed for mild pain, fever or headache.    Yes [provider]  calcitonin, salmon, (MIACALCIN/FORTICAL) 200 UNIT/ACT nasal spray Place 1 spray into alternate nostrils daily.   Yes [provider]  calcium carbonate (OSCAL) 1500 (600 Ca) MG TABS tablet Take 600 mg of elemental calcium by mouth daily with breakfast.   Yes [provider]  cetirizine (ZYRTEC) 10 MG tablet Take 10 mg by  mouth daily.   Yes [provider]  cholecalciferol (VITAMIN D3) 25 MCG (1000 UT) tablet Take 1,000 Units by mouth daily with breakfast.    Yes [provider]  DESITIN 40 % PSTE Apply 1 application topically in the morning and at bedtime. To buttocks 11/05/20  Yes [provider]  digoxin (LANOXIN) 0.125 MG tablet Take 0.125 mg by mouth every other day.    Yes [provider]  donepezil (ARICEPT) 5 MG tablet Take 5 mg by mouth at bedtime. 10/15/18  Yes [provider]  DULoxetine (CYMBALTA) 30 MG capsule Take 30 mg by mouth daily. 12/11/20  Yes [provider]  HYDROcodone-acetaminophen (NORCO/VICODIN) 5-325 MG tablet Take 1-2 tablets by mouth every 6 (six) hours as needed. Patient taking differently: Take 1-2 tablets by mouth every 6 (six) hours as needed for severe pain. 01/07/21  Yes Kerney Elbe, DO  [employer] [facility] [place]) Apply 1 application topically See admin instructions. Dermacloud ointment- Apply to buttocks after each incontinent episode   Yes [provider]  latanoprost (XALATAN) 0.005 % ophthalmic solution Place 1 drop into both eyes at bedtime.   Yes [provider]  levETIRAcetam (KEPPRA) 500 MG tablet Take 500 mg by mouth 2 (two) times daily.   Yes [provider]  ondansetron (ZOFRAN) 4 MG tablet Take 1 tablet (4 mg total) by mouth every 6 (six) hours as needed for nausea. 01/07/21  Yes Sheikh, Omair Latif, DO  pantoprazole (PROTONIX) 40 MG tablet Take 40 mg by mouth daily.   Yes [provider]  pregabalin (LYRICA) 50 MG capsule Take 50 mg by mouth 2 (two) times daily.   Yes [provider]  saccharomyces boulardii (FLORASTOR) 250 MG capsule Take 1 capsule (250 mg total) by mouth 2 (two) times daily. 01/07/21  Yes Sheikh, Omair Latif, DO  timolol (TIMOPTIC) 0.5 % ophthalmic solution Place 1 drop into both eyes daily. 06/15/14  Yes [provider]  traMADol  (ULTRAM) 50 MG tablet Take 1 tablet (50 mg total) by mouth every 8 (eight) hours as needed for severe pain. 01/07/21  Yes Sheikh, Omair Latif, DO  traZODone (DESYREL) 50 MG tablet Take 25 mg by mouth every evening.  10/15/18  Yes [provider]  Nystatin (GERHARDT'S BUTT CREAM) CREA Apply 1 application topically 2 (two) times daily. Patient not taking: No sig reported 01/07/21   [facility],  Bertram Savin, DO   Allergies  Allergen Reactions   Ciprofloxacin Hcl Other (See Comments)    On MAR   Effexor [Venlafaxine] Other (See Comments)    On MAR   Gantrisin [Sulfisoxazole] Other (See Comments)    On MAR   Levaquin [Levofloxacin] Other (See Comments)    On MAR   Sulfa Antibiotics Rash   Review of Systems  Unable to perform ROS: Dementia   Physical Exam Vitals and nursing note reviewed.  Constitutional:      General: She is sleeping. She is not in acute distress.    Comments: Elderly Caucasian F  Cardiovascular:     Rate and Rhythm: Normal rate.  Pulmonary:     Effort: Pulmonary effort is normal. No respiratory distress.    Vital Signs: BP (!) 168/60   Pulse 83   Temp 97.6 F (36.4 C) (Oral)   Resp 11   Ht 5' (1.524 m)   Wt 50 kg   SpO2 100%   BMI 21.53 kg/m  Pain Scale: 0-10   Pain Score: Asleep   SpO2: SpO2: 100 % O2 Device:SpO2: 100 % O2 Flow Rate: .   IO: Intake/output summary: No intake or output data in the 24 hours ending 06/21/21 1213  LBM:   Baseline Weight: Weight: 50 kg Most recent weight: Weight: 50 kg     Palliative Assessment/Data: 30%     Time In: 11:00am  Time Out: 11:50am  Time Total: 50 minutes  Greater than 50% of this time was spent in counseling and coordinating care related to the above assessment and plan.  Dorthy Cooler, PA-C Palliative Medicine Team Team phone # [phone]  Thank you for allowing the Palliative Medicine Team to assist in the care of this patient. Please utilize secure chat with additional questions, if  there is no response within 30 minutes please call the above phone number.  Palliative Medicine Team providers are available by phone from 7am to 7pm daily and can be reached through the team cell phone.  Should this patient require assistance outside of these hours, please call the patient's attending physician.

## 2021-06-21 NOTE — ED Notes (Signed)
Nurse report given to [facility], RN at this time. I will transport her to the floor shortly.

## 2021-06-21 NOTE — ED Notes (Addendum)
Notified provider about pt BP 133/48 (69). Provider informed low diastolic is ok, and to notify if MAP drops persistently below 60-65.

## 2021-06-21 NOTE — Progress Notes (Signed)
PROGRESS NOTE    Kendra Smith  GMW:[id number] DOB: October 28, 1926 DOA: 06/20/2021 PCP: Chilton Greathouse, MD   Chief Complaint  Patient presents with   Abnormal Lab    Brief Narrative:  Kendra Smith is Kendra Smith 87 y.o. female with medical history significant of Kendra Smith. fib, seizure, CKD 3A, dementia, depression, GI bleed, hyperlipidemia, hypertension who presents with abnormal lab value from her facility. As above patient was sent from her facility with an abnormal lab value noted to have hemoglobin of 6.4.  History obtained with assistance of family chart review due to patient's dementia.  She had been having some abdominal pain and nausea earlier and her labs were checked because of this.  Found to have Kendra Smith hemoglobin of 6.4 and sent to the ED for further evaluation.  Spoke with her daughter Kendra Smith by phone to clarify discussion on palliative versus end-of-life/hospice care.  Daughter, who is POA, states that patient had discussed which with her before her dementia had sent in and she is Kendra Smith DNR and DNI.  Daughter states she would also not want procedures at this point in her mother's life including any endoscopic evaluation by GI.  She also states that if the hemoglobin continues to drop she would not want her mother to receive any blood transfusions and she would be allowed to pass naturally and pursue hospice care in that setting.  She does state however that if her mother is found to have Kendra Smith deficiency in iron or folate or B12 she would like supplementation to see if her mother's hemoglobin is able to recover in that setting.  She is not currently advocating for full comfort measures/hospice care as she wants her to continue to receive home medications, IV fluids, antibiotics if needed.  But does not want as above ACLS intervention nor procedure nor transfusions.   ED Course: Vital signs in the ED significant for blood pressure in the 110s to 150s systolic.  Lab work-up including CMP and CBC is still  pending.  FOBT in the ED was negative.  Patient type and screen also ordered.  No imaging ordered in the ED.  Patient was ordered Kynsie Falkner dose of morphine in the ED.  Assessment & Plan:   Principal Problem:   Anemia Active Problems:   Dementia (HCC)   PAF (paroxysmal atrial fibrillation) (HCC)   DNR (do not resuscitate)   Seizure (HCC)  Anemia History of GI bleed > Sent to the ED due to Kendra Smith hemoglobin of 6.4 at her facility.  Hemoglobin improved, to 7 this morning - pending iron, b12, folate, ferritin, retics - hopefully can plan for discharge to facility tomorrow with palliative or hospice following, will continue to discuss > she's not interested in invasive workup (no Gi c/s).  No transfusions. > ok with supplements -Monitor on telemetry for any signs of worsening anemia, would want to contact daughter if anemia accelerated. - Trend hemoglobin - Consult to palliative care   Nauseas  Vomiting  Concern for aspiration I don't think CXR needed at this point  O2 stable, no signs of resp distress Consider only if significant change and within goals She's on modified diet per discussion with daughter  History of GI bleed - No diagnosis of GERD however remains on PPI   Atrial fibrillation - Continue home digoxin   Seizure Disorder - Continue home Keppra  Dementia Depression - Continue home donepezil and Lyrica - Continue home trazodone  DVT prophylaxis: SCD Code Status: full  Family Communication: daughter  over phone Disposition:   Status is: Observation  The patient will require care spanning > 2 midnights and should be moved to inpatient because: Inpatient level of care appropriate due to severity of illness  Dispo: The patient is from: Home              Anticipated d/c is to: Home              Patient currently is not medically stable to d/c.   Difficult to place patient No    Consultants:  palliative  Procedures:  none  Antimicrobials: ( Anti-infectives  (From admission, onward)    None      Subjective: No complaints Pleasantly confused  Objective: Vitals:   06/21/21 1115 06/21/21 1215 06/21/21 1220 06/21/21 1354  BP: (!) 121/41 (S) (!) 145/46 100/80 (!) 121/58  Pulse: (!) 54 61  60  Resp: 13 12  18   Temp:    98 F (36.7 C)  TempSrc:    Oral  SpO2: 95% 99%  99%  Weight:      Height:       No intake or output data in the 24 hours ending 06/21/21 08/21/21 Filed Weights   06/20/21 1829  Weight: 50 kg    Examination:  General exam: Appears calm and comfortable  Respiratory system: unlabored Cardiovascular system:RRR Gastrointestinal system: Abdomen is nondistended, soft and nontender Central nervous system: pleasantly confused. No focal neurological deficits. Extremities: no lee Skin: No rashes, lesions or ulcers    Data Reviewed: I have personally reviewed following labs and imaging studies  CBC: Recent Labs  Lab 06/21/21 0500  WBC 7.4  HGB 7.0*  HCT 26.6*  MCV 78.0*  PLT 377    Basic Metabolic Panel: Recent Labs  Lab 06/21/21 0500  NA 139  K 3.7  CL 102  CO2 28  GLUCOSE 90  BUN 21  CREATININE 0.83  CALCIUM 9.4    GFR: Estimated Creatinine Clearance: 31.1 mL/min (by C-G formula based on SCr of 0.83 mg/dL).  Liver Function Tests: Recent Labs  Lab 06/21/21 0500  AST 14*  ALT 8  ALKPHOS 76  BILITOT 0.2*  PROT 7.2  ALBUMIN 2.8*    CBG: No results for input(s): GLUCAP in the last 168 hours.   No results found for this or any previous visit (from the past 240 hour(s)).       Radiology Studies: No results found.      Scheduled Meds:  calcitonin (salmon)  1 spray Alternating Nares Daily   calcium carbonate  500 mg of elemental calcium Oral Q breakfast   cholecalciferol  1,000 Units Oral Daily   [START ON 06/22/2021] digoxin  0.125 mg Oral QODAY   donepezil  5 mg Oral QHS   DULoxetine  30 mg Oral Daily   latanoprost  1 drop Both Eyes QHS   levETIRAcetam  500 mg Oral BID    pantoprazole  40 mg Oral Daily   pregabalin  50 mg Oral BID   sodium chloride flush  3 mL Intravenous Q12H   timolol  1 drop Both Eyes Daily   traZODone  25 mg Oral QPM   Continuous Infusions:   LOS: 0 days    Time spent: over 30 min    06/24/2021, MD [facility]   To contact the attending provider between 7A-7P or the covering provider during after hours 7P-7A, please log into the web site [email or web address] and access using universal [employer] password for that web  site. If you do not have the password, please call the hospital operator.  06/21/2021, 6:38 PM

## 2021-06-21 NOTE — ED Notes (Signed)
Family updated as to patient's status.

## 2021-06-21 NOTE — ED Notes (Addendum)
Bed linen changed, place in new clean depends, purewick is on and working. Patient sitting up and fed a few bites of breakfast.

## 2021-06-21 NOTE — ED Notes (Signed)
Nurse is not able to take report at this time. Floor staff asked me to call back later

## 2021-06-21 NOTE — Progress Notes (Signed)
[place] Rm 039 Civil engineer, contracting [facility]) Hospital Liaison note:  This patient is currently enrolled in [facility] outpatient-based Palliative Care. Will continue to follow for disposition.  Please call with any outpatient palliative questions or concerns.  Thank you, Abran Cantor, LPN [facility] Liaison [phone]

## 2021-06-22 DIAGNOSIS — D649 Anemia, unspecified: Secondary | ICD-10-CM | POA: Diagnosis not present

## 2021-06-22 LAB — HEMOGLOBIN AND HEMATOCRIT, BLOOD
HCT: 22.1 % — ABNORMAL LOW (ref 36.0–46.0)
Hemoglobin: 6.1 g/dL — CL (ref 12.0–15.0)

## 2021-06-22 MED ORDER — FERROUS SULFATE 325 (65 FE) MG PO TABS
325.0000 mg | ORAL_TABLET | Freq: Every day | ORAL | Status: DC
  Administered 2021-06-22 – 2021-06-23 (×2): 325 mg via ORAL
  Filled 2021-06-22 (×2): qty 1

## 2021-06-22 MED ORDER — FERROUS SULFATE 325 (65 FE) MG PO TABS: 325.0000 mg | ORAL_TABLET | Freq: Every day | ORAL | 0 refills | Status: AC

## 2021-06-22 MED ORDER — CYANOCOBALAMIN 1000 MCG PO TABS: 1000.0000 ug | ORAL_TABLET | Freq: Every day | ORAL | 0 refills | Status: AC

## 2021-06-22 MED ORDER — VITAMIN B-12 1000 MCG PO TABS
1000.0000 ug | ORAL_TABLET | Freq: Every day | ORAL | Status: DC
  Administered 2021-06-22 – 2021-06-23 (×2): 1000 ug via ORAL
  Filled 2021-06-22 (×2): qty 1

## 2021-06-22 NOTE — TOC Transition Note (Signed)
Transition of Care [facility] - CM/SW Discharge Note   Patient Details  Name: Kendra Smith MRN: [id number] Date of Birth: [birth year]  Transition of Care [facility]) CM/SW Contact:  [name] [place], [place] Phone Number: 06/22/2021, 12:59 PM   Clinical Narrative:     Patient to discharge back to [facility]. Patient will be going to room 506. RN to call report to [phone].  Patient to be transported by [facility]   [address], LCSW Transition of Care [phone]           Patient Goals and CMS Choice        Discharge Placement                       Discharge Plan and Services                                     Social Determinants of Health (SDOH) Interventions     Readmission Risk Interventions No flowsheet data found.

## 2021-06-22 NOTE — Progress Notes (Signed)
Date and time results received: 06/22/21 at 1140   Test: Hemoglobin  Critical Value: 6.1  Name of Provider Notified: Lacretia Nicks, MD paged  Orders Received? Or Actions Taken?:  Awaiting MD response

## 2021-06-22 NOTE — TOC Progression Note (Addendum)
Transition of Care [facility]) - Progression Note    Patient Details  Name: Kendra Smith MRN: [id number] Date of Birth: July 22, 1926  Transition of Care [facility]) CM/SW Contact  [name] [place], [place] Phone Number: [phone] 06/22/2021, 10:44 AM  Clinical Narrative:    Phone call to [facility], spoke with Dwaine Deter who confirmed that patient can return, they just need the discharge summary by noon to ensure they can obtain her medications. Fl2 and discharge summary to be submitted once received. Report to be called in to [phone]. Patient will be going to room 506. Patient to be transported by [facility].   [address], LCSW Transition of Care [phone]         Expected Discharge Plan and Services                                                 Social Determinants of Health (SDOH) Interventions    Readmission Risk Interventions No flowsheet data found.

## 2021-06-22 NOTE — Progress Notes (Signed)
SLP Cancellation Note  Patient Details Name: Kendra Smith MRN: [id number] DOB: 1926-08-11   Cancelled treatment:       Reason Eval/Treat Not Completed: Other (comment) (Patient discharging to SNF with MD recommendation for hospice and SNF level SLP eval and tx as necessary for possible dysphagia)   Angela Nevin, MA, CCC-SLP Speech Therapy [facility]

## 2021-06-22 NOTE — TOC Progression Note (Signed)
Transition of Care [facility]) - Progression Note    Patient Details  Name: Kendra Smith MRN: [id number] Date of Birth: 10-05-1926  Transition of Care [facility]) CM/SW Contact  [name] [place], [place] Phone Number: [phone] 06/22/2021, 10:20 AM  Clinical Narrative:     Phone call to Asher Muir at [facility] to coordinate patient's return. Voicemail message left requesting a return call.  [address], LCSW Transition of Care [phone]        Expected Discharge Plan and Services                                                 Social Determinants of Health (SDOH) Interventions    Readmission Risk Interventions No flowsheet data found.

## 2021-06-22 NOTE — [place] FL2 (Signed)
[place] MEDICAID FL2 LEVEL OF CARE SCREENING TOOL     IDENTIFICATION  Patient Name: Kendra Smith Birthdate: Mar 18, 1926 Sex: female Admission Date (Current Location): 06/20/2021  [facility] and [place] Number:  Producer, television/film/video and Address:  [facility] [place], [address], [place], [place] [address]      Provider Number:    Attending Physician Name and Address:  Zigmund Daniel., *  Relative Name and Phone Number:  [name] [phone]    Current Level of Care: SNF Recommended Level of Care: Nursing Facility Prior Approval Number:    Date Approved/Denied:   PASRR Number: [id number]  Discharge Plan: SNF    Current Diagnoses: Patient Active Problem List   Diagnosis Date Noted   Goals of care, counseling/discussion    Anemia 06/20/2021   Pressure injury of skin 01/04/2021   Acute renal failure superimposed on stage 3a chronic kidney disease (HCC) 01/03/2021   Severe sepsis (HCC) 01/03/2021   Metabolic acidosis 01/03/2021   Acute metabolic encephalopathy 01/03/2021   Seizure (HCC) 05/28/2020   Weakness generalized    GI bleed 08/30/2019   UTI (urinary tract infection) 08/30/2019   Hypokalemia 08/30/2019   Demand ischemia (HCC) 08/30/2019   Palliative care by specialist    COVID-19    DNR (do not resuscitate)    Adult failure to thrive    Left acetabular fracture (HCC) 08/23/2019   Pelvic rami fracture (HCC) 08/23/2019   PAF (paroxysmal atrial fibrillation) (HCC) 08/23/2019   Rib fracture-12 01/12/2019   Fracture of transverse process of lumbar vertebra L1-L2 01/12/2019   Dementia (HCC) 01/12/2019   Fall 01/12/2019   Leukocytosis 01/12/2019   Depression    Hypertension     Orientation RESPIRATION BLADDER Height & Weight     Self, Place  Normal Continent Weight: 108 lb 7.5 oz (49.2 kg) Height:  5' (152.4 cm)  BEHAVIORAL SYMPTOMS/MOOD NEUROLOGICAL BOWEL NUTRITION STATUS      Incontinent Diet  AMBULATORY STATUS  COMMUNICATION OF NEEDS Skin       Normal                       Personal Care Assistance Level of Assistance  Bathing, Feeding, Dressing Bathing Assistance: Limited assistance Feeding assistance: Limited assistance Dressing Assistance: Limited assistance     Functional Limitations Info  Sight, Hearing, Speech Sight Info: Adequate Hearing Info: Adequate Speech Info: Adequate    SPECIAL CARE FACTORS FREQUENCY                       Contractures Contractures Info: Not present    Additional Factors Info  Code Status Code Status Info: DNR             Current Medications (06/22/2021):  This is the current hospital active medication list Current Facility-Administered Medications  Medication Dose Route Frequency Provider Last Rate Last Admin   acetaminophen (TYLENOL) tablet 650 mg  650 mg Oral Q6H PRN Synetta Fail, MD       Or   acetaminophen (TYLENOL) suppository 650 mg  650 mg Rectal Q6H PRN Synetta Fail, MD       calcitonin (salmon) (MIACALCIN/FORTICAL) nasal spray 1 spray  1 spray Alternating Nares Daily Synetta Fail, MD   1 spray at 06/22/21 1308   calcium carbonate (OS-CAL - dosed in mg of elemental calcium) tablet 500 mg of elemental calcium  500 mg of elemental calcium Oral Q  breakfast Synetta Fail, MD   500 mg of elemental calcium at 06/22/21 0850   cholecalciferol (VITAMIN D3) tablet 1,000 Units  1,000 Units Oral Daily Synetta Fail, MD   1,000 Units at 06/22/21 0915   digoxin (LANOXIN) tablet 0.125 mg  0.125 mg Oral Margit Hanks, MD   0.125 mg at 06/22/21 0944   donepezil (ARICEPT) tablet 5 mg  5 mg Oral QHS Synetta Fail, MD   5 mg at 06/21/21 2123   DULoxetine (CYMBALTA) DR capsule 30 mg  30 mg Oral Daily Synetta Fail, MD   30 mg at 06/22/21 0932   ferrous sulfate tablet 325 mg  325 mg Oral Q breakfast Zigmund Daniel., MD       HYDROcodone-acetaminophen (NORCO/VICODIN) 5-325 MG per tablet 1-2  tablet  1-2 tablet Oral Q6H PRN Synetta Fail, MD   1 tablet at 06/22/21 0916   latanoprost (XALATAN) 0.005 % ophthalmic solution 1 drop  1 drop Both Eyes QHS Synetta Fail, MD   1 drop at 06/21/21 2124   levETIRAcetam (KEPPRA) tablet 500 mg  500 mg Oral BID Synetta Fail, MD   500 mg at 06/22/21 0916   pantoprazole (PROTONIX) EC tablet 40 mg  40 mg Oral Daily Synetta Fail, MD   40 mg at 06/22/21 0916   pregabalin (LYRICA) capsule 50 mg  50 mg Oral BID Synetta Fail, MD   50 mg at 06/22/21 0916   sodium chloride flush (NS) 0.9 % injection 3 mL  3 mL Intravenous Q12H Synetta Fail, MD   3 mL at 06/22/21 0921   timolol (TIMOPTIC) 0.5 % ophthalmic solution 1 drop  1 drop Both Eyes Daily Synetta Fail, MD   1 drop at 06/22/21 6712   traZODone (DESYREL) tablet 25 mg  25 mg Oral QPM Synetta Fail, MD   25 mg at 06/21/21 1755   vitamin B-12 (CYANOCOBALAMIN) tablet 1,000 mcg  1,000 mcg Oral Daily Zigmund Daniel., MD   1,000 mcg at 06/22/21 4580     Discharge Medications: Please see discharge summary for a list of discharge medications.  Relevant Imaging Results:  Relevant Lab Results:   Additional Information SSN [id number]  [name] [place], [place]

## 2021-06-22 NOTE — Discharge Summary (Signed)
Physician Discharge Summary  Kendra Smith PZW:[id number] DOB: [birth year] DOA: 06/20/2021  PCP: Chilton Greathouse, MD  Admit date: 06/20/2021 Discharge date: 06/22/2021  Time spent: 40 minutes  Recommendations for Outpatient Follow-up:  Recommend hospice to follow at [facility] Recommend reviewing MOST form with family to discuss what situations she'd desire to be hospitalized for, based on my discussion, it seems like there are only rare situations she'd want her mother to be hospitalized (brief antibiotics or for comfort medicines).  Please review most with family and continue to discuss goals of care for clarification. Hb 6.1 on discharge.  Discussed with daughter who was not interested in any interventions or aggressive workup.  We started iron and vitamin b12.  She was not interested in transfusion.  Hb 6.1 is downtrending, but fluctuating from 6.4 at facility and 7 found here.  Maybe slow downtrend, no blood in stool, but cant rule out bleeding.  Based on goals of care, I think getting back to [facility] with hospice is appropriate. Some coughing after eating, recommend SLP to follow at facility, if aspirating, based on discussion, hospice/comfort is probably appropriate  Discharge Diagnoses:  Principal Problem:   Anemia Active Problems:   Dementia (HCC)   PAF (paroxysmal atrial fibrillation) (HCC)   DNR (do not resuscitate)   Seizure (HCC)   Discharge Condition: stable  Diet recommendation: dysphagia 3, as tolerated  Filed Weights   06/20/21 1829 06/22/21 0454  Weight: 50 kg 49.2 kg    History of present illness:  Kendra Smith is Kendra Smith 87 y.o. female with medical history significant of Javin Nong. fib, seizure, CKD 3A, dementia, depression, GI bleed, hyperlipidemia, hypertension who presents with abnormal lab value from her facility. As above patient was sent from her facility with an abnormal lab value noted to have hemoglobin of 6.4.  History obtained with assistance of family  chart review due to patient's dementia.  She had been having some abdominal pain and nausea earlier and her labs were checked because of this.  Found to have Kendra Smith hemoglobin of 6.4 and sent to the ED for further evaluation.  Spoke with her daughter Kendra Smith by phone to clarify discussion on palliative versus end-of-life/hospice care.  Daughter, who is POA, states that patient had discussed which with her before her dementia had sent in and she is Kendra Smith DNR and DNI.  Daughter states she would also not want procedures at this point in her mother's life including any endoscopic evaluation by GI.  She also states that if the hemoglobin continues to drop she would not want her mother to receive any blood transfusions and she would be allowed to pass naturally and pursue hospice care in that setting.  She does state however that if her mother is found to have Kendra Smith deficiency in iron or folate or B12 she would like supplementation to see if her mother's hemoglobin is able to recover in that setting.  She is not currently advocating for full comfort measures/hospice care as she wants her to continue to receive home medications, IV fluids, antibiotics if needed.  But does not want as above ACLS intervention nor procedure nor transfusions.  She was admitted for anemia.  Her initial repeat Hb had improved, but the subsequent is lower.  Based on discussions with patients daughter, focus is for comfort.  She's not interested in transfusions or aggressive anemia workup at this time.  We've started iron, b12 based on her labs.  I've recommended hospice to follow up and Ambera Fedele MOST  form to be completed as well.    Hospital Course:  Anemia History of GI bleed > Sent to the ED due to Kendra Smith hemoglobin of 6.4 at her facility.  Hemoglobin improved, to 7 on initial check, but is 6.1 on day of discharge (this is fluctuating, difficult to tell if clear downtrend) - daughter is not interested in transfusion, focus is getting back to [facility] - I  recommend hospice follow outpatient and clarify reasons to return to hospital with MOST - labs show iron def anemia and b12 deficiency, will treat this - recommend discharge to [facility] with hospice following > she's not interested in invasive workup (no Gi c/s).  No transfusions. > ok with supplements - Trend hemoglobin   Nauseas  Vomiting  Concern for aspiration I don't think CXR needed at this point  O2 stable, no signs of resp distress Consider only if significant change and within goals She's on modified diet per discussion with daughter Recommend SLP to follow outpatient, I think hospice is appropriate and comfort feeds appropriate based on discussion with daughter   History of GI bleed - No diagnosis of GERD however remains on PPI   Atrial fibrillation - Continue home digoxin   Seizure Disorder - Continue home Keppra  Dementia Depression - Continue home donepezil and Lyrica - Continue home trazodone  Procedures: None    Consultations: Palliative care  Discharge Exam: Vitals:   06/22/21 0900 06/22/21 1150  BP:  (!) 127/54  Pulse: 81 87  Resp:  17  Temp:  98.1 F (36.7 C)  SpO2: 97% 96%   Patient complaints of discomfort - Uncomfortable all over Discussed plan of care with duaghter - she's eager to get her back to SNF today  General: No acute distress. Cardiovascular: Heart sounds show Aarilyn Smith regular rate, and rhythm. Lungs: Clear to auscultation bilaterally  Abdomen: Soft, nontender, nondistended easantly confused. Moves all extremities 4 . Cranial nerves II through XII grossly intact.  Discharge Instructions   Discharge Instructions     Call MD for:  difficulty breathing, headache or visual disturbances   Complete by: As directed    Call MD for:  extreme fatigue   Complete by: As directed    Call MD for:  hives   Complete by: As directed    Call MD for:  persistant dizziness or light-headedness   Complete by: As directed    Call MD for:   persistant nausea and vomiting   Complete by: As directed    Call MD for:  redness, tenderness, or signs of infection (pain, swelling, redness, odor or green/yellow discharge around incision site)   Complete by: As directed    Call MD for:  severe uncontrolled pain   Complete by: As directed    Call MD for:  temperature >100.4   Complete by: As directed    Diet - low sodium heart healthy   Complete by: As directed    Discharge instructions   Complete by: As directed    You were seen for anemia (low blood counts).  Your repeat blood counts showed Kaniel Kiang hemoglobin of around 6, but with our discussion, there was Calib Wadhwa focus on comfort and getting her back to [facility].  We did not transfuse her here.  Based on this plan, I think hospice is appropriate.  I recommend they review Alyissa Whidbee MOST form with you to clarify which situations you'd prefer her to come to the hospital for, but I am recommending for hospice to follow and make  recommendations at the SNF.  Our priority, after discussion, is for comfort, but there are limited situations that she should be transferred to the hospital.  I recommend hospice to follow at the skilled and clarify goals with the family.  I think filling out Palin Tristan MOST form would be appropriate, please ask if you can do this with the hospice and palliative care providers at the SNF.   Return as needed based on your care goals.  Please ask your PCP to request records from this hospitalization so they know what was done and what the next steps will be.   Discharge wound care:   Complete by: As directed    Continue wound care for your sacral wounds Frequent turns   Increase activity slowly   Complete by: As directed       Allergies as of 06/22/2021       Reactions   Ciprofloxacin Hcl Other (See Comments)   On MAR   Effexor [venlafaxine] Other (See Comments)   On MAR   Gantrisin [sulfisoxazole] Other (See Comments)   On MAR   Levaquin [levofloxacin] Other (See Comments)    On MAR   Sulfa Antibiotics Rash        Medication List     TAKE these medications    acetaminophen 500 MG tablet Commonly known as: TYLENOL Take 1,000 mg by mouth every 6 (six) hours as needed for mild pain, fever or headache.   calcitonin (salmon) 200 UNIT/ACT nasal spray Commonly known as: MIACALCIN/FORTICAL Place 1 spray into alternate nostrils daily.   calcium carbonate 1500 (600 Ca) MG Tabs tablet Commonly known as: OSCAL Take 600 mg of elemental calcium by mouth daily with breakfast.   cetirizine 10 MG tablet Commonly known as: ZYRTEC Take 10 mg by mouth daily.   cholecalciferol 25 MCG (1000 UNIT) tablet Commonly known as: VITAMIN D3 Take 1,000 Units by mouth daily with breakfast.   cyanocobalamin 1000 MCG tablet Take 1 tablet (1,000 mcg total) by mouth daily. Start taking on: June 23, 2021   DERMACLOUD EX Apply 1 application topically See admin instructions. Dermacloud ointment- Apply to buttocks after each incontinent episode   Desitin 40 % Pste Generic drug: Zinc Oxide Apply 1 application topically in the morning and at bedtime. To buttocks   digoxin 0.125 MG tablet Commonly known as: LANOXIN Take 0.125 mg by mouth every other day.   donepezil 5 MG tablet Commonly known as: ARICEPT Take 5 mg by mouth at bedtime.   DULoxetine 30 MG capsule Commonly known as: CYMBALTA Take 30 mg by mouth daily.   ferrous sulfate 325 (65 FE) MG tablet Take 1 tablet (325 mg total) by mouth daily with breakfast.   Gerhardt's butt cream Crea Apply 1 application topically 2 (two) times daily.   HYDROcodone-acetaminophen 5-325 MG tablet Commonly known as: NORCO/VICODIN Take 1-2 tablets by mouth every 6 (six) hours as needed. What changed: reasons to take this   latanoprost 0.005 % ophthalmic solution Commonly known as: XALATAN Place 1 drop into both eyes at bedtime.   levETIRAcetam 500 MG tablet Commonly known as: KEPPRA Take 500 mg by mouth 2 (two) times  daily.   ondansetron 4 MG tablet Commonly known as: ZOFRAN Take 1 tablet (4 mg total) by mouth every 6 (six) hours as needed for nausea.   pantoprazole 40 MG tablet Commonly known as: PROTONIX Take 40 mg by mouth daily.   pregabalin 50 MG capsule Commonly known as: LYRICA Take 50 mg by mouth 2 (  two) times daily.   saccharomyces boulardii 250 MG capsule Commonly known as: FLORASTOR Take 1 capsule (250 mg total) by mouth 2 (two) times daily.   timolol 0.5 % ophthalmic solution Commonly known as: TIMOPTIC Place 1 drop into both eyes daily.   traMADol 50 MG tablet Commonly known as: ULTRAM Take 1 tablet (50 mg total) by mouth every 8 (eight) hours as needed for severe pain.   traZODone 50 MG tablet Commonly known as: DESYREL Take 25 mg by mouth every evening.               Discharge Care Instructions  (From admission, onward)           Start     Ordered   06/22/21 0000  Discharge wound care:       Comments: Continue wound care for your sacral wounds Frequent turns   06/22/21 1152           Allergies  Allergen Reactions   Ciprofloxacin Hcl Other (See Comments)    On MAR   Effexor [Venlafaxine] Other (See Comments)    On MAR   Gantrisin [Sulfisoxazole] Other (See Comments)    On MAR   Levaquin [Levofloxacin] Other (See Comments)    On MAR   Sulfa Antibiotics Rash      The results of significant diagnostics from this hospitalization (including imaging, microbiology, ancillary and laboratory) are listed below for reference.    Significant Diagnostic Studies: No results found.  Microbiology: No results found for this or any previous visit (from the past 240 hour(s)).   Labs: Basic Metabolic Panel: Recent Labs  Lab 06/21/21 0500  NA 139  K 3.7  CL 102  CO2 28  GLUCOSE 90  BUN 21  CREATININE 0.83  CALCIUM 9.4   Liver Function Tests: Recent Labs  Lab 06/21/21 0500  AST 14*  ALT 8  ALKPHOS 76  BILITOT 0.2*  PROT 7.2  ALBUMIN  2.8*   No results for input(s): LIPASE, AMYLASE in the last 168 hours. No results for input(s): AMMONIA in the last 168 hours. CBC: Recent Labs  Lab 06/21/21 0500 06/22/21 1019  WBC 7.4  --   HGB 7.0* 6.1*  HCT 26.6* 22.1*  MCV 78.0*  --   PLT 377  --    Cardiac Enzymes: No results for input(s): CKTOTAL, CKMB, CKMBINDEX, TROPONINI in the last 168 hours. BNP: BNP (last 3 results) No results for input(s): BNP in the last 8760 hours.  ProBNP (last 3 results) No results for input(s): PROBNP in the last 8760 hours.  CBG: No results for input(s): GLUCAP in the last 168 hours.     Signed:  Lacretia Nicks MD.  [facility] 06/22/2021, 12:05 PM

## 2021-06-22 NOTE — Plan of Care (Signed)
  Problem: Education: Goal: Knowledge of General Education information will improve Description: Including pain rating scale, medication(s)/side effects and non-pharmacologic comfort measures Outcome: Adequate for Discharge   Problem: Clinical Measurements: Goal: Ability to maintain clinical measurements within normal limits will improve Outcome: Adequate for Discharge Goal: Will remain free from infection Outcome: Adequate for Discharge Goal: Diagnostic test results will improve Outcome: Adequate for Discharge Goal: Respiratory complications will improve Outcome: Adequate for Discharge Goal: Cardiovascular complication will be avoided Outcome: Adequate for Discharge   Problem: Clinical Measurements: Goal: Will remain free from infection Outcome: Adequate for Discharge   Problem: Clinical Measurements: Goal: Diagnostic test results will improve Outcome: Adequate for Discharge   Problem: Clinical Measurements: Goal: Respiratory complications will improve Outcome: Adequate for Discharge   Problem: Clinical Measurements: Goal: Cardiovascular complication will be avoided Outcome: Adequate for Discharge   Problem: Activity: Goal: Risk for activity intolerance will decrease Outcome: Adequate for Discharge   Problem: Nutrition: Goal: Adequate nutrition will be maintained Outcome: Adequate for Discharge   Problem: Coping: Goal: Level of anxiety will decrease Outcome: Adequate for Discharge   Problem: Elimination: Goal: Will not experience complications related to bowel motility Outcome: Adequate for Discharge Goal: Will not experience complications related to urinary retention Outcome: Adequate for Discharge   Problem: Elimination: Goal: Will not experience complications related to bowel motility Outcome: Adequate for Discharge   Problem: Elimination: Goal: Will not experience complications related to urinary retention Outcome: Adequate for Discharge   Problem:  Safety: Goal: Ability to remain free from injury will improve Outcome: Adequate for Discharge   Problem: Skin Integrity: Goal: Risk for impaired skin integrity will decrease Outcome: Adequate for Discharge

## 2021-06-22 NOTE — Plan of Care (Signed)
  Problem: Pain Managment: Goal: General experience of comfort will improve 06/22/2021 1648 by Mohamed-Medani, Kei Mcelhiney A, RN Outcome: Completed/Met 06/22/2021 1648 by Mohamed-Medani, Beren Yniguez A, RN Outcome: Progressing

## 2021-06-23 DIAGNOSIS — Z743 Need for continuous supervision: Secondary | ICD-10-CM | POA: Diagnosis not present

## 2021-06-23 DIAGNOSIS — I959 Hypotension, unspecified: Secondary | ICD-10-CM | POA: Diagnosis not present

## 2021-06-23 DIAGNOSIS — R4182 Altered mental status, unspecified: Secondary | ICD-10-CM | POA: Diagnosis not present

## 2021-06-23 DIAGNOSIS — R0902 Hypoxemia: Secondary | ICD-10-CM | POA: Diagnosis not present

## 2021-06-23 DIAGNOSIS — D649 Anemia, unspecified: Secondary | ICD-10-CM | POA: Diagnosis not present

## 2021-06-23 NOTE — Progress Notes (Signed)
Called [facility] to let them know patient is ready for discharge to [facility].

## 2021-06-23 NOTE — Progress Notes (Addendum)
[facility] arrived a little after 12 midnight. They requested to call Joetta Manners prior to transport to make sure the facility will still take the patient at this time. Spoke with Young Berry at [facility] who called her Director of Nursing. Per DON, they can not take the patient at this time because admission hours  is from 9-6 pm. They requested to send patient via [facility] in the morning.  [facility], [place] NP, made aware via secure page. Nursing staff will pass on to day shift RN to send patient to [facility] before 12 noon.

## 2021-06-23 NOTE — TOC Progression Note (Addendum)
Transition of Care [facility]) - Progression Note    Patient Details  Name: CHANEQUA SPEES MRN: [id number] Date of Birth: 27-Mar-1926  Transition of Care [facility]) CM/SW Contact  Renella Steig [place], [place] Phone [phone] 06/23/2021, 10:24 AM  Clinical Narrative:     [facility] arrived outside of the admission hours for admission to [facility]. Phone call to [facility] to confirm admission for today. Confirmed with Bjorn Loser that patient can return. Medical necessity form updated. Patient's nurse updated and provided contact information [phone] to give report.  [address], LCSW Transition of Care [phone]        Expected Discharge Plan and Services           Expected Discharge Date: 06/22/21                                     Social Determinants of Health (SDOH) Interventions    Readmission Risk Interventions No flowsheet data found.

## 2021-06-23 NOTE — Progress Notes (Signed)
Seen at bedside, denies pain or discomfort Discharged yesterday, but [facility] came late Vitals:   06/22/21 2230 06/23/21 0507  BP: 129/64 (!) 122/58  Pulse: 60 62  Resp: 18 16  Temp: 98.2 F (36.8 C) 98 F (36.7 C)  SpO2: 98% 99%   NAD RRR Unlabored Warm and dry No LEE  See discharge summary for plan Planning for conservative care, try to keep her home and avoid admission if possible Recommending hospice (see discharge summary)

## 2021-06-24 DIAGNOSIS — H409 Unspecified glaucoma: Secondary | ICD-10-CM | POA: Diagnosis not present

## 2021-06-24 DIAGNOSIS — F331 Major depressive disorder, recurrent, moderate: Secondary | ICD-10-CM | POA: Diagnosis not present

## 2021-06-24 DIAGNOSIS — Z741 Need for assistance with personal care: Secondary | ICD-10-CM | POA: Diagnosis not present

## 2021-06-24 DIAGNOSIS — R569 Unspecified convulsions: Secondary | ICD-10-CM | POA: Diagnosis not present

## 2021-06-24 DIAGNOSIS — E785 Hyperlipidemia, unspecified: Secondary | ICD-10-CM | POA: Diagnosis not present

## 2021-06-24 DIAGNOSIS — I4891 Unspecified atrial fibrillation: Secondary | ICD-10-CM | POA: Diagnosis not present

## 2021-06-24 DIAGNOSIS — D649 Anemia, unspecified: Secondary | ICD-10-CM | POA: Diagnosis not present

## 2021-06-24 DIAGNOSIS — M6281 Muscle weakness (generalized): Secondary | ICD-10-CM | POA: Diagnosis not present

## 2021-06-24 DIAGNOSIS — I10 Essential (primary) hypertension: Secondary | ICD-10-CM | POA: Diagnosis not present

## 2021-06-24 DIAGNOSIS — R293 Abnormal posture: Secondary | ICD-10-CM | POA: Diagnosis not present

## 2021-06-24 DIAGNOSIS — F0390 Unspecified dementia without behavioral disturbance: Secondary | ICD-10-CM | POA: Diagnosis not present

## 2021-06-25 DIAGNOSIS — R569 Unspecified convulsions: Secondary | ICD-10-CM | POA: Diagnosis not present

## 2021-06-25 DIAGNOSIS — D649 Anemia, unspecified: Secondary | ICD-10-CM | POA: Diagnosis not present

## 2021-06-25 DIAGNOSIS — F0390 Unspecified dementia without behavioral disturbance: Secondary | ICD-10-CM | POA: Diagnosis not present

## 2021-06-25 DIAGNOSIS — I10 Essential (primary) hypertension: Secondary | ICD-10-CM | POA: Diagnosis not present

## 2021-06-25 DIAGNOSIS — H409 Unspecified glaucoma: Secondary | ICD-10-CM | POA: Diagnosis not present

## 2021-06-25 DIAGNOSIS — I4891 Unspecified atrial fibrillation: Secondary | ICD-10-CM | POA: Diagnosis not present

## 2021-06-26 DIAGNOSIS — M810 Age-related osteoporosis without current pathological fracture: Secondary | ICD-10-CM | POA: Diagnosis not present

## 2021-06-26 DIAGNOSIS — F329 Major depressive disorder, single episode, unspecified: Secondary | ICD-10-CM | POA: Diagnosis not present

## 2021-06-26 DIAGNOSIS — F0390 Unspecified dementia without behavioral disturbance: Secondary | ICD-10-CM | POA: Diagnosis not present

## 2021-06-26 DIAGNOSIS — G9340 Encephalopathy, unspecified: Secondary | ICD-10-CM | POA: Diagnosis not present

## 2021-06-26 DIAGNOSIS — R42 Dizziness and giddiness: Secondary | ICD-10-CM | POA: Diagnosis not present

## 2021-06-26 DIAGNOSIS — D649 Anemia, unspecified: Secondary | ICD-10-CM | POA: Diagnosis not present

## 2021-06-26 DIAGNOSIS — K219 Gastro-esophageal reflux disease without esophagitis: Secondary | ICD-10-CM | POA: Diagnosis not present

## 2021-06-26 DIAGNOSIS — G40901 Epilepsy, unspecified, not intractable, with status epilepticus: Secondary | ICD-10-CM | POA: Diagnosis not present

## 2021-06-26 DIAGNOSIS — G40909 Epilepsy, unspecified, not intractable, without status epilepticus: Secondary | ICD-10-CM | POA: Diagnosis not present

## 2021-06-26 DIAGNOSIS — R251 Tremor, unspecified: Secondary | ICD-10-CM | POA: Diagnosis not present

## 2021-06-26 DIAGNOSIS — I480 Paroxysmal atrial fibrillation: Secondary | ICD-10-CM | POA: Diagnosis not present

## 2021-06-26 DIAGNOSIS — I10 Essential (primary) hypertension: Secondary | ICD-10-CM | POA: Diagnosis not present

## 2021-06-27 DIAGNOSIS — D649 Anemia, unspecified: Secondary | ICD-10-CM | POA: Diagnosis not present

## 2021-06-27 DIAGNOSIS — H409 Unspecified glaucoma: Secondary | ICD-10-CM | POA: Diagnosis not present

## 2021-06-27 DIAGNOSIS — R569 Unspecified convulsions: Secondary | ICD-10-CM | POA: Diagnosis not present

## 2021-06-27 DIAGNOSIS — F0390 Unspecified dementia without behavioral disturbance: Secondary | ICD-10-CM | POA: Diagnosis not present

## 2021-06-27 DIAGNOSIS — I10 Essential (primary) hypertension: Secondary | ICD-10-CM | POA: Diagnosis not present

## 2021-06-27 DIAGNOSIS — I4891 Unspecified atrial fibrillation: Secondary | ICD-10-CM | POA: Diagnosis not present

## 2021-06-28 ENCOUNTER — Telehealth: Payer: Self-pay

## 2021-06-28 DIAGNOSIS — I10 Essential (primary) hypertension: Secondary | ICD-10-CM | POA: Diagnosis not present

## 2021-06-28 DIAGNOSIS — I480 Paroxysmal atrial fibrillation: Secondary | ICD-10-CM | POA: Diagnosis not present

## 2021-06-28 DIAGNOSIS — I4891 Unspecified atrial fibrillation: Secondary | ICD-10-CM | POA: Diagnosis not present

## 2021-06-28 DIAGNOSIS — R569 Unspecified convulsions: Secondary | ICD-10-CM | POA: Diagnosis not present

## 2021-06-28 DIAGNOSIS — F329 Major depressive disorder, single episode, unspecified: Secondary | ICD-10-CM | POA: Diagnosis not present

## 2021-06-28 DIAGNOSIS — F0390 Unspecified dementia without behavioral disturbance: Secondary | ICD-10-CM | POA: Diagnosis not present

## 2021-06-28 DIAGNOSIS — D649 Anemia, unspecified: Secondary | ICD-10-CM | POA: Diagnosis not present

## 2021-06-28 DIAGNOSIS — H409 Unspecified glaucoma: Secondary | ICD-10-CM | POA: Diagnosis not present

## 2021-06-28 NOTE — Telephone Encounter (Signed)
Received report that caregiving staff at [facility] were concerned that patient will need comfort meds before admission to Hospice. AV visit scheduled for 07/01/21 @ 10 am.  Phone call placed to [facility], nurse who is caring for patient. Per Dawn, patient has hydrocodone in place and has been effective in managing pain. Dawn made aware of when AV is scheduled for.

## 2021-06-30 DIAGNOSIS — D649 Anemia, unspecified: Secondary | ICD-10-CM | POA: Diagnosis not present

## 2021-06-30 DIAGNOSIS — I4891 Unspecified atrial fibrillation: Secondary | ICD-10-CM | POA: Diagnosis not present

## 2021-06-30 DIAGNOSIS — H409 Unspecified glaucoma: Secondary | ICD-10-CM | POA: Diagnosis not present

## 2021-06-30 DIAGNOSIS — R569 Unspecified convulsions: Secondary | ICD-10-CM | POA: Diagnosis not present

## 2021-06-30 DIAGNOSIS — I10 Essential (primary) hypertension: Secondary | ICD-10-CM | POA: Diagnosis not present

## 2021-06-30 DIAGNOSIS — F0390 Unspecified dementia without behavioral disturbance: Secondary | ICD-10-CM | POA: Diagnosis not present

## 2021-07-01 DIAGNOSIS — D631 Anemia in chronic kidney disease: Secondary | ICD-10-CM | POA: Diagnosis not present

## 2021-07-01 DIAGNOSIS — F419 Anxiety disorder, unspecified: Secondary | ICD-10-CM | POA: Diagnosis not present

## 2021-07-01 DIAGNOSIS — F32A Depression, unspecified: Secondary | ICD-10-CM | POA: Diagnosis not present

## 2021-07-01 DIAGNOSIS — H409 Unspecified glaucoma: Secondary | ICD-10-CM | POA: Diagnosis not present

## 2021-07-01 DIAGNOSIS — K219 Gastro-esophageal reflux disease without esophagitis: Secondary | ICD-10-CM | POA: Diagnosis not present

## 2021-07-01 DIAGNOSIS — E43 Unspecified severe protein-calorie malnutrition: Secondary | ICD-10-CM | POA: Diagnosis not present

## 2021-07-01 DIAGNOSIS — I129 Hypertensive chronic kidney disease with stage 1 through stage 4 chronic kidney disease, or unspecified chronic kidney disease: Secondary | ICD-10-CM | POA: Diagnosis not present

## 2021-07-01 DIAGNOSIS — N1831 Chronic kidney disease, stage 3a: Secondary | ICD-10-CM | POA: Diagnosis not present

## 2021-07-01 DIAGNOSIS — E785 Hyperlipidemia, unspecified: Secondary | ICD-10-CM | POA: Diagnosis not present

## 2021-07-01 DIAGNOSIS — L299 Pruritus, unspecified: Secondary | ICD-10-CM | POA: Diagnosis not present

## 2021-07-01 DIAGNOSIS — F0280 Dementia in other diseases classified elsewhere without behavioral disturbance: Secondary | ICD-10-CM | POA: Diagnosis not present

## 2021-07-01 DIAGNOSIS — G311 Senile degeneration of brain, not elsewhere classified: Secondary | ICD-10-CM | POA: Diagnosis not present

## 2021-07-01 DIAGNOSIS — Z6820 Body mass index (BMI) 20.0-20.9, adult: Secondary | ICD-10-CM | POA: Diagnosis not present

## 2021-07-01 DIAGNOSIS — D649 Anemia, unspecified: Secondary | ICD-10-CM | POA: Diagnosis not present

## 2021-07-01 DIAGNOSIS — R296 Repeated falls: Secondary | ICD-10-CM | POA: Diagnosis not present

## 2021-07-01 DIAGNOSIS — G40909 Epilepsy, unspecified, not intractable, without status epilepticus: Secondary | ICD-10-CM | POA: Diagnosis not present

## 2021-07-01 DIAGNOSIS — R569 Unspecified convulsions: Secondary | ICD-10-CM | POA: Diagnosis not present

## 2021-07-01 DIAGNOSIS — M810 Age-related osteoporosis without current pathological fracture: Secondary | ICD-10-CM | POA: Diagnosis not present

## 2021-07-01 DIAGNOSIS — I10 Essential (primary) hypertension: Secondary | ICD-10-CM | POA: Diagnosis not present

## 2021-07-01 DIAGNOSIS — Z8719 Personal history of other diseases of the digestive system: Secondary | ICD-10-CM | POA: Diagnosis not present

## 2021-07-01 DIAGNOSIS — Z87440 Personal history of urinary (tract) infections: Secondary | ICD-10-CM | POA: Diagnosis not present

## 2021-07-01 DIAGNOSIS — F0390 Unspecified dementia without behavioral disturbance: Secondary | ICD-10-CM | POA: Diagnosis not present

## 2021-07-01 DIAGNOSIS — I4891 Unspecified atrial fibrillation: Secondary | ICD-10-CM | POA: Diagnosis not present

## 2021-07-01 DIAGNOSIS — E538 Deficiency of other specified B group vitamins: Secondary | ICD-10-CM | POA: Diagnosis not present

## 2021-07-02 DIAGNOSIS — F0390 Unspecified dementia without behavioral disturbance: Secondary | ICD-10-CM | POA: Diagnosis not present

## 2021-07-02 DIAGNOSIS — I10 Essential (primary) hypertension: Secondary | ICD-10-CM | POA: Diagnosis not present

## 2021-07-02 DIAGNOSIS — G311 Senile degeneration of brain, not elsewhere classified: Secondary | ICD-10-CM | POA: Diagnosis not present

## 2021-07-02 DIAGNOSIS — D649 Anemia, unspecified: Secondary | ICD-10-CM | POA: Diagnosis not present

## 2021-07-02 DIAGNOSIS — E43 Unspecified severe protein-calorie malnutrition: Secondary | ICD-10-CM | POA: Diagnosis not present

## 2021-07-02 DIAGNOSIS — H409 Unspecified glaucoma: Secondary | ICD-10-CM | POA: Diagnosis not present

## 2021-07-02 DIAGNOSIS — Z6820 Body mass index (BMI) 20.0-20.9, adult: Secondary | ICD-10-CM | POA: Diagnosis not present

## 2021-07-02 DIAGNOSIS — F0280 Dementia in other diseases classified elsewhere without behavioral disturbance: Secondary | ICD-10-CM | POA: Diagnosis not present

## 2021-07-02 DIAGNOSIS — I4891 Unspecified atrial fibrillation: Secondary | ICD-10-CM | POA: Diagnosis not present

## 2021-07-02 DIAGNOSIS — K219 Gastro-esophageal reflux disease without esophagitis: Secondary | ICD-10-CM | POA: Diagnosis not present

## 2021-07-02 DIAGNOSIS — R296 Repeated falls: Secondary | ICD-10-CM | POA: Diagnosis not present

## 2021-07-02 DIAGNOSIS — R569 Unspecified convulsions: Secondary | ICD-10-CM | POA: Diagnosis not present

## 2021-07-03 DIAGNOSIS — R569 Unspecified convulsions: Secondary | ICD-10-CM | POA: Diagnosis not present

## 2021-07-03 DIAGNOSIS — D649 Anemia, unspecified: Secondary | ICD-10-CM | POA: Diagnosis not present

## 2021-07-03 DIAGNOSIS — H409 Unspecified glaucoma: Secondary | ICD-10-CM | POA: Diagnosis not present

## 2021-07-03 DIAGNOSIS — I10 Essential (primary) hypertension: Secondary | ICD-10-CM | POA: Diagnosis not present

## 2021-07-03 DIAGNOSIS — I4891 Unspecified atrial fibrillation: Secondary | ICD-10-CM | POA: Diagnosis not present

## 2021-07-03 DIAGNOSIS — F0390 Unspecified dementia without behavioral disturbance: Secondary | ICD-10-CM | POA: Diagnosis not present

## 2021-07-04 ENCOUNTER — Encounter: Payer: Self-pay | Admitting: Podiatry

## 2021-07-04 ENCOUNTER — Other Ambulatory Visit: Payer: Self-pay

## 2021-07-04 ENCOUNTER — Ambulatory Visit (INDEPENDENT_AMBULATORY_CARE_PROVIDER_SITE_OTHER): Payer: Medicare Other | Admitting: Podiatry

## 2021-07-04 DIAGNOSIS — B351 Tinea unguium: Secondary | ICD-10-CM

## 2021-07-04 DIAGNOSIS — H409 Unspecified glaucoma: Secondary | ICD-10-CM | POA: Diagnosis not present

## 2021-07-04 DIAGNOSIS — I10 Essential (primary) hypertension: Secondary | ICD-10-CM | POA: Diagnosis not present

## 2021-07-04 DIAGNOSIS — G311 Senile degeneration of brain, not elsewhere classified: Secondary | ICD-10-CM | POA: Diagnosis not present

## 2021-07-04 DIAGNOSIS — F0390 Unspecified dementia without behavioral disturbance: Secondary | ICD-10-CM | POA: Diagnosis not present

## 2021-07-04 DIAGNOSIS — E43 Unspecified severe protein-calorie malnutrition: Secondary | ICD-10-CM | POA: Diagnosis not present

## 2021-07-04 DIAGNOSIS — F0280 Dementia in other diseases classified elsewhere without behavioral disturbance: Secondary | ICD-10-CM | POA: Diagnosis not present

## 2021-07-04 DIAGNOSIS — M79674 Pain in right toe(s): Secondary | ICD-10-CM

## 2021-07-04 DIAGNOSIS — I4891 Unspecified atrial fibrillation: Secondary | ICD-10-CM | POA: Diagnosis not present

## 2021-07-04 DIAGNOSIS — D649 Anemia, unspecified: Secondary | ICD-10-CM | POA: Diagnosis not present

## 2021-07-04 DIAGNOSIS — Z6820 Body mass index (BMI) 20.0-20.9, adult: Secondary | ICD-10-CM | POA: Diagnosis not present

## 2021-07-04 DIAGNOSIS — K219 Gastro-esophageal reflux disease without esophagitis: Secondary | ICD-10-CM | POA: Diagnosis not present

## 2021-07-04 DIAGNOSIS — R569 Unspecified convulsions: Secondary | ICD-10-CM | POA: Diagnosis not present

## 2021-07-04 DIAGNOSIS — R296 Repeated falls: Secondary | ICD-10-CM | POA: Diagnosis not present

## 2021-07-04 DIAGNOSIS — M79675 Pain in left toe(s): Secondary | ICD-10-CM | POA: Diagnosis not present

## 2021-07-06 DIAGNOSIS — R569 Unspecified convulsions: Secondary | ICD-10-CM | POA: Diagnosis not present

## 2021-07-06 DIAGNOSIS — I4891 Unspecified atrial fibrillation: Secondary | ICD-10-CM | POA: Diagnosis not present

## 2021-07-06 DIAGNOSIS — D649 Anemia, unspecified: Secondary | ICD-10-CM | POA: Diagnosis not present

## 2021-07-06 DIAGNOSIS — H409 Unspecified glaucoma: Secondary | ICD-10-CM | POA: Diagnosis not present

## 2021-07-06 DIAGNOSIS — I10 Essential (primary) hypertension: Secondary | ICD-10-CM | POA: Diagnosis not present

## 2021-07-06 DIAGNOSIS — F0390 Unspecified dementia without behavioral disturbance: Secondary | ICD-10-CM | POA: Diagnosis not present

## 2021-07-08 ENCOUNTER — Encounter: Payer: Self-pay | Admitting: Podiatry

## 2021-07-08 DIAGNOSIS — K219 Gastro-esophageal reflux disease without esophagitis: Secondary | ICD-10-CM | POA: Diagnosis not present

## 2021-07-08 DIAGNOSIS — E43 Unspecified severe protein-calorie malnutrition: Secondary | ICD-10-CM | POA: Diagnosis not present

## 2021-07-08 DIAGNOSIS — I10 Essential (primary) hypertension: Secondary | ICD-10-CM | POA: Diagnosis not present

## 2021-07-08 DIAGNOSIS — F0280 Dementia in other diseases classified elsewhere without behavioral disturbance: Secondary | ICD-10-CM | POA: Diagnosis not present

## 2021-07-08 DIAGNOSIS — Z6820 Body mass index (BMI) 20.0-20.9, adult: Secondary | ICD-10-CM | POA: Diagnosis not present

## 2021-07-08 DIAGNOSIS — R569 Unspecified convulsions: Secondary | ICD-10-CM | POA: Diagnosis not present

## 2021-07-08 DIAGNOSIS — G311 Senile degeneration of brain, not elsewhere classified: Secondary | ICD-10-CM | POA: Diagnosis not present

## 2021-07-08 DIAGNOSIS — D649 Anemia, unspecified: Secondary | ICD-10-CM | POA: Diagnosis not present

## 2021-07-08 DIAGNOSIS — R296 Repeated falls: Secondary | ICD-10-CM | POA: Diagnosis not present

## 2021-07-08 DIAGNOSIS — F0390 Unspecified dementia without behavioral disturbance: Secondary | ICD-10-CM | POA: Diagnosis not present

## 2021-07-08 DIAGNOSIS — H409 Unspecified glaucoma: Secondary | ICD-10-CM | POA: Diagnosis not present

## 2021-07-08 DIAGNOSIS — I4891 Unspecified atrial fibrillation: Secondary | ICD-10-CM | POA: Diagnosis not present

## 2021-07-08 NOTE — Progress Notes (Signed)
  Subjective:  Patient ID: Kendra Smith, female    DOB: 1926-06-15,  MRN: [id number]  Chief Complaint  Patient presents with   Nail Problem     (np) Routine Foot Care-Nail trim    87 y.o. female presents with the above complaint. History confirmed with patient.  Nails are thickened elongated and difficult to cut.  Objective:  Physical Exam: warm, good capillary refill, no trophic changes or ulcerative lesions, and normal DP and PT pulses. Left Foot: dystrophic yellowed discolored nail plates with subungual debris Right Foot: dystrophic yellowed discolored nail plates with subungual debris  Assessment:   1. Pain due to onychomycosis of toenails of both feet      Plan:  Patient was evaluated and treated and all questions answered.  Discussed the etiology and treatment options for the condition in detail with the patient. Educated patient on the topical and oral treatment options for mycotic nails. Recommended debridement of the nails today. Sharp and mechanical debridement performed of all painful and mycotic nails today. Nails debrided in length and thickness using a nail nipper to level of comfort. Discussed treatment options including appropriate shoe gear. Follow up as needed for painful nails.    Return in about 3 months (around 10/04/2021) for at risk diabetic foot care.

## 2021-07-09 DIAGNOSIS — H409 Unspecified glaucoma: Secondary | ICD-10-CM | POA: Diagnosis not present

## 2021-07-09 DIAGNOSIS — I4891 Unspecified atrial fibrillation: Secondary | ICD-10-CM | POA: Diagnosis not present

## 2021-07-09 DIAGNOSIS — F0390 Unspecified dementia without behavioral disturbance: Secondary | ICD-10-CM | POA: Diagnosis not present

## 2021-07-09 DIAGNOSIS — D649 Anemia, unspecified: Secondary | ICD-10-CM | POA: Diagnosis not present

## 2021-07-09 DIAGNOSIS — R569 Unspecified convulsions: Secondary | ICD-10-CM | POA: Diagnosis not present

## 2021-07-09 DIAGNOSIS — I10 Essential (primary) hypertension: Secondary | ICD-10-CM | POA: Diagnosis not present

## 2021-07-10 DIAGNOSIS — I4891 Unspecified atrial fibrillation: Secondary | ICD-10-CM | POA: Diagnosis not present

## 2021-07-10 DIAGNOSIS — H409 Unspecified glaucoma: Secondary | ICD-10-CM | POA: Diagnosis not present

## 2021-07-10 DIAGNOSIS — I10 Essential (primary) hypertension: Secondary | ICD-10-CM | POA: Diagnosis not present

## 2021-07-10 DIAGNOSIS — E43 Unspecified severe protein-calorie malnutrition: Secondary | ICD-10-CM | POA: Diagnosis not present

## 2021-07-10 DIAGNOSIS — G311 Senile degeneration of brain, not elsewhere classified: Secondary | ICD-10-CM | POA: Diagnosis not present

## 2021-07-10 DIAGNOSIS — K219 Gastro-esophageal reflux disease without esophagitis: Secondary | ICD-10-CM | POA: Diagnosis not present

## 2021-07-10 DIAGNOSIS — R296 Repeated falls: Secondary | ICD-10-CM | POA: Diagnosis not present

## 2021-07-10 DIAGNOSIS — F0280 Dementia in other diseases classified elsewhere without behavioral disturbance: Secondary | ICD-10-CM | POA: Diagnosis not present

## 2021-07-10 DIAGNOSIS — Z6820 Body mass index (BMI) 20.0-20.9, adult: Secondary | ICD-10-CM | POA: Diagnosis not present

## 2021-07-10 DIAGNOSIS — F0390 Unspecified dementia without behavioral disturbance: Secondary | ICD-10-CM | POA: Diagnosis not present

## 2021-07-10 DIAGNOSIS — D649 Anemia, unspecified: Secondary | ICD-10-CM | POA: Diagnosis not present

## 2021-07-10 DIAGNOSIS — R569 Unspecified convulsions: Secondary | ICD-10-CM | POA: Diagnosis not present

## 2021-07-11 DIAGNOSIS — M810 Age-related osteoporosis without current pathological fracture: Secondary | ICD-10-CM | POA: Diagnosis not present

## 2021-07-11 DIAGNOSIS — L299 Pruritus, unspecified: Secondary | ICD-10-CM | POA: Diagnosis not present

## 2021-07-11 DIAGNOSIS — F331 Major depressive disorder, recurrent, moderate: Secondary | ICD-10-CM | POA: Diagnosis not present

## 2021-07-11 DIAGNOSIS — K219 Gastro-esophageal reflux disease without esophagitis: Secondary | ICD-10-CM | POA: Diagnosis not present

## 2021-07-11 DIAGNOSIS — R296 Repeated falls: Secondary | ICD-10-CM | POA: Diagnosis not present

## 2021-07-11 DIAGNOSIS — Z741 Need for assistance with personal care: Secondary | ICD-10-CM | POA: Diagnosis not present

## 2021-07-11 DIAGNOSIS — I4891 Unspecified atrial fibrillation: Secondary | ICD-10-CM | POA: Diagnosis not present

## 2021-07-11 DIAGNOSIS — I129 Hypertensive chronic kidney disease with stage 1 through stage 4 chronic kidney disease, or unspecified chronic kidney disease: Secondary | ICD-10-CM | POA: Diagnosis not present

## 2021-07-11 DIAGNOSIS — F0280 Dementia in other diseases classified elsewhere without behavioral disturbance: Secondary | ICD-10-CM | POA: Diagnosis not present

## 2021-07-11 DIAGNOSIS — E43 Unspecified severe protein-calorie malnutrition: Secondary | ICD-10-CM | POA: Diagnosis not present

## 2021-07-11 DIAGNOSIS — E785 Hyperlipidemia, unspecified: Secondary | ICD-10-CM | POA: Diagnosis not present

## 2021-07-11 DIAGNOSIS — F32A Depression, unspecified: Secondary | ICD-10-CM | POA: Diagnosis not present

## 2021-07-11 DIAGNOSIS — Z87440 Personal history of urinary (tract) infections: Secondary | ICD-10-CM | POA: Diagnosis not present

## 2021-07-11 DIAGNOSIS — I10 Essential (primary) hypertension: Secondary | ICD-10-CM | POA: Diagnosis not present

## 2021-07-11 DIAGNOSIS — M6281 Muscle weakness (generalized): Secondary | ICD-10-CM | POA: Diagnosis not present

## 2021-07-11 DIAGNOSIS — F419 Anxiety disorder, unspecified: Secondary | ICD-10-CM | POA: Diagnosis not present

## 2021-07-11 DIAGNOSIS — R569 Unspecified convulsions: Secondary | ICD-10-CM | POA: Diagnosis not present

## 2021-07-11 DIAGNOSIS — R293 Abnormal posture: Secondary | ICD-10-CM | POA: Diagnosis not present

## 2021-07-11 DIAGNOSIS — E538 Deficiency of other specified B group vitamins: Secondary | ICD-10-CM | POA: Diagnosis not present

## 2021-07-11 DIAGNOSIS — F0390 Unspecified dementia without behavioral disturbance: Secondary | ICD-10-CM | POA: Diagnosis not present

## 2021-07-11 DIAGNOSIS — H409 Unspecified glaucoma: Secondary | ICD-10-CM | POA: Diagnosis not present

## 2021-07-11 DIAGNOSIS — Z6820 Body mass index (BMI) 20.0-20.9, adult: Secondary | ICD-10-CM | POA: Diagnosis not present

## 2021-07-11 DIAGNOSIS — D631 Anemia in chronic kidney disease: Secondary | ICD-10-CM | POA: Diagnosis not present

## 2021-07-11 DIAGNOSIS — G311 Senile degeneration of brain, not elsewhere classified: Secondary | ICD-10-CM | POA: Diagnosis not present

## 2021-07-11 DIAGNOSIS — Z8719 Personal history of other diseases of the digestive system: Secondary | ICD-10-CM | POA: Diagnosis not present

## 2021-07-11 DIAGNOSIS — N1831 Chronic kidney disease, stage 3a: Secondary | ICD-10-CM | POA: Diagnosis not present

## 2021-07-11 DIAGNOSIS — D649 Anemia, unspecified: Secondary | ICD-10-CM | POA: Diagnosis not present

## 2021-07-11 DIAGNOSIS — G40909 Epilepsy, unspecified, not intractable, without status epilepticus: Secondary | ICD-10-CM | POA: Diagnosis not present

## 2021-07-12 DIAGNOSIS — I4891 Unspecified atrial fibrillation: Secondary | ICD-10-CM | POA: Diagnosis not present

## 2021-07-12 DIAGNOSIS — D649 Anemia, unspecified: Secondary | ICD-10-CM | POA: Diagnosis not present

## 2021-07-12 DIAGNOSIS — H409 Unspecified glaucoma: Secondary | ICD-10-CM | POA: Diagnosis not present

## 2021-07-12 DIAGNOSIS — M6281 Muscle weakness (generalized): Secondary | ICD-10-CM | POA: Diagnosis not present

## 2021-07-12 DIAGNOSIS — F0390 Unspecified dementia without behavioral disturbance: Secondary | ICD-10-CM | POA: Diagnosis not present

## 2021-07-12 DIAGNOSIS — I10 Essential (primary) hypertension: Secondary | ICD-10-CM | POA: Diagnosis not present

## 2021-07-15 DIAGNOSIS — F0390 Unspecified dementia without behavioral disturbance: Secondary | ICD-10-CM | POA: Diagnosis not present

## 2021-07-15 DIAGNOSIS — H409 Unspecified glaucoma: Secondary | ICD-10-CM | POA: Diagnosis not present

## 2021-07-15 DIAGNOSIS — I10 Essential (primary) hypertension: Secondary | ICD-10-CM | POA: Diagnosis not present

## 2021-07-15 DIAGNOSIS — D649 Anemia, unspecified: Secondary | ICD-10-CM | POA: Diagnosis not present

## 2021-07-15 DIAGNOSIS — M6281 Muscle weakness (generalized): Secondary | ICD-10-CM | POA: Diagnosis not present

## 2021-07-15 DIAGNOSIS — I4891 Unspecified atrial fibrillation: Secondary | ICD-10-CM | POA: Diagnosis not present

## 2021-07-16 DIAGNOSIS — R296 Repeated falls: Secondary | ICD-10-CM | POA: Diagnosis not present

## 2021-07-16 DIAGNOSIS — F0390 Unspecified dementia without behavioral disturbance: Secondary | ICD-10-CM | POA: Diagnosis not present

## 2021-07-16 DIAGNOSIS — E43 Unspecified severe protein-calorie malnutrition: Secondary | ICD-10-CM | POA: Diagnosis not present

## 2021-07-16 DIAGNOSIS — G311 Senile degeneration of brain, not elsewhere classified: Secondary | ICD-10-CM | POA: Diagnosis not present

## 2021-07-16 DIAGNOSIS — D649 Anemia, unspecified: Secondary | ICD-10-CM | POA: Diagnosis not present

## 2021-07-16 DIAGNOSIS — H409 Unspecified glaucoma: Secondary | ICD-10-CM | POA: Diagnosis not present

## 2021-07-16 DIAGNOSIS — I10 Essential (primary) hypertension: Secondary | ICD-10-CM | POA: Diagnosis not present

## 2021-07-16 DIAGNOSIS — F0280 Dementia in other diseases classified elsewhere without behavioral disturbance: Secondary | ICD-10-CM | POA: Diagnosis not present

## 2021-07-16 DIAGNOSIS — Z6820 Body mass index (BMI) 20.0-20.9, adult: Secondary | ICD-10-CM | POA: Diagnosis not present

## 2021-07-16 DIAGNOSIS — M6281 Muscle weakness (generalized): Secondary | ICD-10-CM | POA: Diagnosis not present

## 2021-07-16 DIAGNOSIS — K219 Gastro-esophageal reflux disease without esophagitis: Secondary | ICD-10-CM | POA: Diagnosis not present

## 2021-07-16 DIAGNOSIS — I4891 Unspecified atrial fibrillation: Secondary | ICD-10-CM | POA: Diagnosis not present

## 2021-07-17 DIAGNOSIS — D649 Anemia, unspecified: Secondary | ICD-10-CM | POA: Diagnosis not present

## 2021-07-17 DIAGNOSIS — I480 Paroxysmal atrial fibrillation: Secondary | ICD-10-CM | POA: Diagnosis not present

## 2021-07-17 DIAGNOSIS — R5381 Other malaise: Secondary | ICD-10-CM | POA: Diagnosis not present

## 2021-07-17 DIAGNOSIS — F329 Major depressive disorder, single episode, unspecified: Secondary | ICD-10-CM | POA: Diagnosis not present

## 2021-07-17 DIAGNOSIS — R251 Tremor, unspecified: Secondary | ICD-10-CM | POA: Diagnosis not present

## 2021-07-17 DIAGNOSIS — F0390 Unspecified dementia without behavioral disturbance: Secondary | ICD-10-CM | POA: Diagnosis not present

## 2021-07-18 DIAGNOSIS — G311 Senile degeneration of brain, not elsewhere classified: Secondary | ICD-10-CM | POA: Diagnosis not present

## 2021-07-18 DIAGNOSIS — R296 Repeated falls: Secondary | ICD-10-CM | POA: Diagnosis not present

## 2021-07-18 DIAGNOSIS — K219 Gastro-esophageal reflux disease without esophagitis: Secondary | ICD-10-CM | POA: Diagnosis not present

## 2021-07-18 DIAGNOSIS — Z6820 Body mass index (BMI) 20.0-20.9, adult: Secondary | ICD-10-CM | POA: Diagnosis not present

## 2021-07-18 DIAGNOSIS — E43 Unspecified severe protein-calorie malnutrition: Secondary | ICD-10-CM | POA: Diagnosis not present

## 2021-07-18 DIAGNOSIS — F0280 Dementia in other diseases classified elsewhere without behavioral disturbance: Secondary | ICD-10-CM | POA: Diagnosis not present

## 2021-07-19 DIAGNOSIS — F0390 Unspecified dementia without behavioral disturbance: Secondary | ICD-10-CM | POA: Diagnosis not present

## 2021-07-19 DIAGNOSIS — F329 Major depressive disorder, single episode, unspecified: Secondary | ICD-10-CM | POA: Diagnosis not present

## 2021-07-19 DIAGNOSIS — R21 Rash and other nonspecific skin eruption: Secondary | ICD-10-CM | POA: Diagnosis not present

## 2021-07-19 DIAGNOSIS — I480 Paroxysmal atrial fibrillation: Secondary | ICD-10-CM | POA: Diagnosis not present

## 2021-07-21 DIAGNOSIS — F0390 Unspecified dementia without behavioral disturbance: Secondary | ICD-10-CM | POA: Diagnosis not present

## 2021-07-21 DIAGNOSIS — R21 Rash and other nonspecific skin eruption: Secondary | ICD-10-CM | POA: Diagnosis not present

## 2021-07-21 DIAGNOSIS — I480 Paroxysmal atrial fibrillation: Secondary | ICD-10-CM | POA: Diagnosis not present

## 2021-07-21 DIAGNOSIS — F329 Major depressive disorder, single episode, unspecified: Secondary | ICD-10-CM | POA: Diagnosis not present

## 2021-07-21 DIAGNOSIS — G4089 Other seizures: Secondary | ICD-10-CM | POA: Diagnosis not present

## 2021-07-21 DIAGNOSIS — R251 Tremor, unspecified: Secondary | ICD-10-CM | POA: Diagnosis not present

## 2021-07-22 DIAGNOSIS — G311 Senile degeneration of brain, not elsewhere classified: Secondary | ICD-10-CM | POA: Diagnosis not present

## 2021-07-22 DIAGNOSIS — R296 Repeated falls: Secondary | ICD-10-CM | POA: Diagnosis not present

## 2021-07-22 DIAGNOSIS — E43 Unspecified severe protein-calorie malnutrition: Secondary | ICD-10-CM | POA: Diagnosis not present

## 2021-07-22 DIAGNOSIS — Z6820 Body mass index (BMI) 20.0-20.9, adult: Secondary | ICD-10-CM | POA: Diagnosis not present

## 2021-07-22 DIAGNOSIS — K219 Gastro-esophageal reflux disease without esophagitis: Secondary | ICD-10-CM | POA: Diagnosis not present

## 2021-07-22 DIAGNOSIS — F0280 Dementia in other diseases classified elsewhere without behavioral disturbance: Secondary | ICD-10-CM | POA: Diagnosis not present

## 2021-07-29 DIAGNOSIS — G311 Senile degeneration of brain, not elsewhere classified: Secondary | ICD-10-CM | POA: Diagnosis not present

## 2021-07-29 DIAGNOSIS — F0280 Dementia in other diseases classified elsewhere without behavioral disturbance: Secondary | ICD-10-CM | POA: Diagnosis not present

## 2021-07-29 DIAGNOSIS — K219 Gastro-esophageal reflux disease without esophagitis: Secondary | ICD-10-CM | POA: Diagnosis not present

## 2021-07-29 DIAGNOSIS — R296 Repeated falls: Secondary | ICD-10-CM | POA: Diagnosis not present

## 2021-07-29 DIAGNOSIS — E43 Unspecified severe protein-calorie malnutrition: Secondary | ICD-10-CM | POA: Diagnosis not present

## 2021-07-29 DIAGNOSIS — Z6820 Body mass index (BMI) 20.0-20.9, adult: Secondary | ICD-10-CM | POA: Diagnosis not present

## 2021-08-01 DIAGNOSIS — G311 Senile degeneration of brain, not elsewhere classified: Secondary | ICD-10-CM | POA: Diagnosis not present

## 2021-08-01 DIAGNOSIS — F0280 Dementia in other diseases classified elsewhere without behavioral disturbance: Secondary | ICD-10-CM | POA: Diagnosis not present

## 2021-08-01 DIAGNOSIS — E43 Unspecified severe protein-calorie malnutrition: Secondary | ICD-10-CM | POA: Diagnosis not present

## 2021-08-01 DIAGNOSIS — R296 Repeated falls: Secondary | ICD-10-CM | POA: Diagnosis not present

## 2021-08-01 DIAGNOSIS — Z6820 Body mass index (BMI) 20.0-20.9, adult: Secondary | ICD-10-CM | POA: Diagnosis not present

## 2021-08-01 DIAGNOSIS — K219 Gastro-esophageal reflux disease without esophagitis: Secondary | ICD-10-CM | POA: Diagnosis not present

## 2021-08-06 DIAGNOSIS — F0280 Dementia in other diseases classified elsewhere without behavioral disturbance: Secondary | ICD-10-CM | POA: Diagnosis not present

## 2021-08-06 DIAGNOSIS — R296 Repeated falls: Secondary | ICD-10-CM | POA: Diagnosis not present

## 2021-08-06 DIAGNOSIS — E43 Unspecified severe protein-calorie malnutrition: Secondary | ICD-10-CM | POA: Diagnosis not present

## 2021-08-06 DIAGNOSIS — F329 Major depressive disorder, single episode, unspecified: Secondary | ICD-10-CM | POA: Diagnosis not present

## 2021-08-06 DIAGNOSIS — K219 Gastro-esophageal reflux disease without esophagitis: Secondary | ICD-10-CM | POA: Diagnosis not present

## 2021-08-06 DIAGNOSIS — F0390 Unspecified dementia without behavioral disturbance: Secondary | ICD-10-CM | POA: Diagnosis not present

## 2021-08-06 DIAGNOSIS — Z6820 Body mass index (BMI) 20.0-20.9, adult: Secondary | ICD-10-CM | POA: Diagnosis not present

## 2021-08-06 DIAGNOSIS — I480 Paroxysmal atrial fibrillation: Secondary | ICD-10-CM | POA: Diagnosis not present

## 2021-08-06 DIAGNOSIS — G311 Senile degeneration of brain, not elsewhere classified: Secondary | ICD-10-CM | POA: Diagnosis not present

## 2021-08-06 DIAGNOSIS — R21 Rash and other nonspecific skin eruption: Secondary | ICD-10-CM | POA: Diagnosis not present

## 2021-08-06 DIAGNOSIS — R251 Tremor, unspecified: Secondary | ICD-10-CM | POA: Diagnosis not present

## 2021-08-07 DIAGNOSIS — R296 Repeated falls: Secondary | ICD-10-CM | POA: Diagnosis not present

## 2021-08-07 DIAGNOSIS — G311 Senile degeneration of brain, not elsewhere classified: Secondary | ICD-10-CM | POA: Diagnosis not present

## 2021-08-07 DIAGNOSIS — Z6820 Body mass index (BMI) 20.0-20.9, adult: Secondary | ICD-10-CM | POA: Diagnosis not present

## 2021-08-07 DIAGNOSIS — E43 Unspecified severe protein-calorie malnutrition: Secondary | ICD-10-CM | POA: Diagnosis not present

## 2021-08-07 DIAGNOSIS — F0280 Dementia in other diseases classified elsewhere without behavioral disturbance: Secondary | ICD-10-CM | POA: Diagnosis not present

## 2021-08-07 DIAGNOSIS — K219 Gastro-esophageal reflux disease without esophagitis: Secondary | ICD-10-CM | POA: Diagnosis not present

## 2021-08-10 DIAGNOSIS — K219 Gastro-esophageal reflux disease without esophagitis: Secondary | ICD-10-CM | POA: Diagnosis not present

## 2021-08-10 DIAGNOSIS — G311 Senile degeneration of brain, not elsewhere classified: Secondary | ICD-10-CM | POA: Diagnosis not present

## 2021-08-10 DIAGNOSIS — N1831 Chronic kidney disease, stage 3a: Secondary | ICD-10-CM | POA: Diagnosis not present

## 2021-08-10 DIAGNOSIS — F0280 Dementia in other diseases classified elsewhere without behavioral disturbance: Secondary | ICD-10-CM | POA: Diagnosis not present

## 2021-08-10 DIAGNOSIS — L299 Pruritus, unspecified: Secondary | ICD-10-CM | POA: Diagnosis not present

## 2021-08-10 DIAGNOSIS — M810 Age-related osteoporosis without current pathological fracture: Secondary | ICD-10-CM | POA: Diagnosis not present

## 2021-08-10 DIAGNOSIS — Z87440 Personal history of urinary (tract) infections: Secondary | ICD-10-CM | POA: Diagnosis not present

## 2021-08-10 DIAGNOSIS — R296 Repeated falls: Secondary | ICD-10-CM | POA: Diagnosis not present

## 2021-08-10 DIAGNOSIS — E43 Unspecified severe protein-calorie malnutrition: Secondary | ICD-10-CM | POA: Diagnosis not present

## 2021-08-10 DIAGNOSIS — G40909 Epilepsy, unspecified, not intractable, without status epilepticus: Secondary | ICD-10-CM | POA: Diagnosis not present

## 2021-08-10 DIAGNOSIS — I129 Hypertensive chronic kidney disease with stage 1 through stage 4 chronic kidney disease, or unspecified chronic kidney disease: Secondary | ICD-10-CM | POA: Diagnosis not present

## 2021-08-10 DIAGNOSIS — F32A Depression, unspecified: Secondary | ICD-10-CM | POA: Diagnosis not present

## 2021-08-10 DIAGNOSIS — H409 Unspecified glaucoma: Secondary | ICD-10-CM | POA: Diagnosis not present

## 2021-08-10 DIAGNOSIS — F419 Anxiety disorder, unspecified: Secondary | ICD-10-CM | POA: Diagnosis not present

## 2021-08-10 DIAGNOSIS — E785 Hyperlipidemia, unspecified: Secondary | ICD-10-CM | POA: Diagnosis not present

## 2021-08-10 DIAGNOSIS — D631 Anemia in chronic kidney disease: Secondary | ICD-10-CM | POA: Diagnosis not present

## 2021-08-10 DIAGNOSIS — E538 Deficiency of other specified B group vitamins: Secondary | ICD-10-CM | POA: Diagnosis not present

## 2021-08-10 DIAGNOSIS — Z6820 Body mass index (BMI) 20.0-20.9, adult: Secondary | ICD-10-CM | POA: Diagnosis not present

## 2021-08-10 DIAGNOSIS — I4891 Unspecified atrial fibrillation: Secondary | ICD-10-CM | POA: Diagnosis not present

## 2021-08-10 DIAGNOSIS — Z8719 Personal history of other diseases of the digestive system: Secondary | ICD-10-CM | POA: Diagnosis not present

## 2021-08-12 DIAGNOSIS — F0280 Dementia in other diseases classified elsewhere without behavioral disturbance: Secondary | ICD-10-CM | POA: Diagnosis not present

## 2021-08-12 DIAGNOSIS — R296 Repeated falls: Secondary | ICD-10-CM | POA: Diagnosis not present

## 2021-08-12 DIAGNOSIS — E43 Unspecified severe protein-calorie malnutrition: Secondary | ICD-10-CM | POA: Diagnosis not present

## 2021-08-12 DIAGNOSIS — K219 Gastro-esophageal reflux disease without esophagitis: Secondary | ICD-10-CM | POA: Diagnosis not present

## 2021-08-12 DIAGNOSIS — G311 Senile degeneration of brain, not elsewhere classified: Secondary | ICD-10-CM | POA: Diagnosis not present

## 2021-08-12 DIAGNOSIS — Z6820 Body mass index (BMI) 20.0-20.9, adult: Secondary | ICD-10-CM | POA: Diagnosis not present

## 2021-08-14 DIAGNOSIS — G311 Senile degeneration of brain, not elsewhere classified: Secondary | ICD-10-CM | POA: Diagnosis not present

## 2021-08-14 DIAGNOSIS — F0280 Dementia in other diseases classified elsewhere without behavioral disturbance: Secondary | ICD-10-CM | POA: Diagnosis not present

## 2021-08-14 DIAGNOSIS — R296 Repeated falls: Secondary | ICD-10-CM | POA: Diagnosis not present

## 2021-08-14 DIAGNOSIS — Z6820 Body mass index (BMI) 20.0-20.9, adult: Secondary | ICD-10-CM | POA: Diagnosis not present

## 2021-08-14 DIAGNOSIS — E43 Unspecified severe protein-calorie malnutrition: Secondary | ICD-10-CM | POA: Diagnosis not present

## 2021-08-14 DIAGNOSIS — K219 Gastro-esophageal reflux disease without esophagitis: Secondary | ICD-10-CM | POA: Diagnosis not present

## 2021-08-15 DIAGNOSIS — Z6820 Body mass index (BMI) 20.0-20.9, adult: Secondary | ICD-10-CM | POA: Diagnosis not present

## 2021-08-15 DIAGNOSIS — G311 Senile degeneration of brain, not elsewhere classified: Secondary | ICD-10-CM | POA: Diagnosis not present

## 2021-08-15 DIAGNOSIS — F0280 Dementia in other diseases classified elsewhere without behavioral disturbance: Secondary | ICD-10-CM | POA: Diagnosis not present

## 2021-08-15 DIAGNOSIS — E43 Unspecified severe protein-calorie malnutrition: Secondary | ICD-10-CM | POA: Diagnosis not present

## 2021-08-15 DIAGNOSIS — R296 Repeated falls: Secondary | ICD-10-CM | POA: Diagnosis not present

## 2021-08-15 DIAGNOSIS — K219 Gastro-esophageal reflux disease without esophagitis: Secondary | ICD-10-CM | POA: Diagnosis not present

## 2021-08-16 DIAGNOSIS — Z6820 Body mass index (BMI) 20.0-20.9, adult: Secondary | ICD-10-CM | POA: Diagnosis not present

## 2021-08-16 DIAGNOSIS — E43 Unspecified severe protein-calorie malnutrition: Secondary | ICD-10-CM | POA: Diagnosis not present

## 2021-08-16 DIAGNOSIS — F0280 Dementia in other diseases classified elsewhere without behavioral disturbance: Secondary | ICD-10-CM | POA: Diagnosis not present

## 2021-08-16 DIAGNOSIS — R296 Repeated falls: Secondary | ICD-10-CM | POA: Diagnosis not present

## 2021-08-16 DIAGNOSIS — K219 Gastro-esophageal reflux disease without esophagitis: Secondary | ICD-10-CM | POA: Diagnosis not present

## 2021-08-16 DIAGNOSIS — G311 Senile degeneration of brain, not elsewhere classified: Secondary | ICD-10-CM | POA: Diagnosis not present

## 2021-08-19 DIAGNOSIS — Z6820 Body mass index (BMI) 20.0-20.9, adult: Secondary | ICD-10-CM | POA: Diagnosis not present

## 2021-08-19 DIAGNOSIS — F0280 Dementia in other diseases classified elsewhere without behavioral disturbance: Secondary | ICD-10-CM | POA: Diagnosis not present

## 2021-08-19 DIAGNOSIS — R296 Repeated falls: Secondary | ICD-10-CM | POA: Diagnosis not present

## 2021-08-19 DIAGNOSIS — K219 Gastro-esophageal reflux disease without esophagitis: Secondary | ICD-10-CM | POA: Diagnosis not present

## 2021-08-19 DIAGNOSIS — G311 Senile degeneration of brain, not elsewhere classified: Secondary | ICD-10-CM | POA: Diagnosis not present

## 2021-08-19 DIAGNOSIS — E43 Unspecified severe protein-calorie malnutrition: Secondary | ICD-10-CM | POA: Diagnosis not present

## 2021-08-21 DIAGNOSIS — F0280 Dementia in other diseases classified elsewhere without behavioral disturbance: Secondary | ICD-10-CM | POA: Diagnosis not present

## 2021-08-21 DIAGNOSIS — F0390 Unspecified dementia without behavioral disturbance: Secondary | ICD-10-CM | POA: Diagnosis not present

## 2021-08-21 DIAGNOSIS — D649 Anemia, unspecified: Secondary | ICD-10-CM | POA: Diagnosis not present

## 2021-08-21 DIAGNOSIS — G9340 Encephalopathy, unspecified: Secondary | ICD-10-CM | POA: Diagnosis not present

## 2021-08-21 DIAGNOSIS — Z6820 Body mass index (BMI) 20.0-20.9, adult: Secondary | ICD-10-CM | POA: Diagnosis not present

## 2021-08-21 DIAGNOSIS — G311 Senile degeneration of brain, not elsewhere classified: Secondary | ICD-10-CM | POA: Diagnosis not present

## 2021-08-21 DIAGNOSIS — E43 Unspecified severe protein-calorie malnutrition: Secondary | ICD-10-CM | POA: Diagnosis not present

## 2021-08-21 DIAGNOSIS — R296 Repeated falls: Secondary | ICD-10-CM | POA: Diagnosis not present

## 2021-08-21 DIAGNOSIS — G40909 Epilepsy, unspecified, not intractable, without status epilepticus: Secondary | ICD-10-CM | POA: Diagnosis not present

## 2021-08-21 DIAGNOSIS — K219 Gastro-esophageal reflux disease without esophagitis: Secondary | ICD-10-CM | POA: Diagnosis not present

## 2021-08-28 DIAGNOSIS — M6281 Muscle weakness (generalized): Secondary | ICD-10-CM | POA: Diagnosis not present

## 2021-08-28 DIAGNOSIS — F0390 Unspecified dementia without behavioral disturbance: Secondary | ICD-10-CM | POA: Diagnosis not present

## 2021-08-28 DIAGNOSIS — Z741 Need for assistance with personal care: Secondary | ICD-10-CM | POA: Diagnosis not present

## 2021-08-28 DIAGNOSIS — F331 Major depressive disorder, recurrent, moderate: Secondary | ICD-10-CM | POA: Diagnosis not present

## 2021-08-28 DIAGNOSIS — R293 Abnormal posture: Secondary | ICD-10-CM | POA: Diagnosis not present

## 2021-08-28 DIAGNOSIS — D649 Anemia, unspecified: Secondary | ICD-10-CM | POA: Diagnosis not present

## 2021-08-28 DIAGNOSIS — H409 Unspecified glaucoma: Secondary | ICD-10-CM | POA: Diagnosis not present

## 2021-08-28 DIAGNOSIS — R569 Unspecified convulsions: Secondary | ICD-10-CM | POA: Diagnosis not present

## 2021-08-28 DIAGNOSIS — E785 Hyperlipidemia, unspecified: Secondary | ICD-10-CM | POA: Diagnosis not present

## 2021-08-28 DIAGNOSIS — I10 Essential (primary) hypertension: Secondary | ICD-10-CM | POA: Diagnosis not present

## 2021-08-28 DIAGNOSIS — I4891 Unspecified atrial fibrillation: Secondary | ICD-10-CM | POA: Diagnosis not present

## 2021-08-29 DIAGNOSIS — K219 Gastro-esophageal reflux disease without esophagitis: Secondary | ICD-10-CM | POA: Diagnosis not present

## 2021-08-29 DIAGNOSIS — F0280 Dementia in other diseases classified elsewhere without behavioral disturbance: Secondary | ICD-10-CM | POA: Diagnosis not present

## 2021-08-29 DIAGNOSIS — G311 Senile degeneration of brain, not elsewhere classified: Secondary | ICD-10-CM | POA: Diagnosis not present

## 2021-08-29 DIAGNOSIS — F0390 Unspecified dementia without behavioral disturbance: Secondary | ICD-10-CM | POA: Diagnosis not present

## 2021-08-29 DIAGNOSIS — I10 Essential (primary) hypertension: Secondary | ICD-10-CM | POA: Diagnosis not present

## 2021-08-29 DIAGNOSIS — H409 Unspecified glaucoma: Secondary | ICD-10-CM | POA: Diagnosis not present

## 2021-08-29 DIAGNOSIS — R569 Unspecified convulsions: Secondary | ICD-10-CM | POA: Diagnosis not present

## 2021-08-29 DIAGNOSIS — Z6820 Body mass index (BMI) 20.0-20.9, adult: Secondary | ICD-10-CM | POA: Diagnosis not present

## 2021-08-29 DIAGNOSIS — I4891 Unspecified atrial fibrillation: Secondary | ICD-10-CM | POA: Diagnosis not present

## 2021-08-29 DIAGNOSIS — D649 Anemia, unspecified: Secondary | ICD-10-CM | POA: Diagnosis not present

## 2021-08-29 DIAGNOSIS — R296 Repeated falls: Secondary | ICD-10-CM | POA: Diagnosis not present

## 2021-08-29 DIAGNOSIS — E43 Unspecified severe protein-calorie malnutrition: Secondary | ICD-10-CM | POA: Diagnosis not present

## 2021-08-30 DIAGNOSIS — R296 Repeated falls: Secondary | ICD-10-CM | POA: Diagnosis not present

## 2021-08-30 DIAGNOSIS — F0280 Dementia in other diseases classified elsewhere without behavioral disturbance: Secondary | ICD-10-CM | POA: Diagnosis not present

## 2021-08-30 DIAGNOSIS — Z6820 Body mass index (BMI) 20.0-20.9, adult: Secondary | ICD-10-CM | POA: Diagnosis not present

## 2021-08-30 DIAGNOSIS — G311 Senile degeneration of brain, not elsewhere classified: Secondary | ICD-10-CM | POA: Diagnosis not present

## 2021-08-30 DIAGNOSIS — E43 Unspecified severe protein-calorie malnutrition: Secondary | ICD-10-CM | POA: Diagnosis not present

## 2021-08-30 DIAGNOSIS — K219 Gastro-esophageal reflux disease without esophagitis: Secondary | ICD-10-CM | POA: Diagnosis not present

## 2021-09-02 DIAGNOSIS — K219 Gastro-esophageal reflux disease without esophagitis: Secondary | ICD-10-CM | POA: Diagnosis not present

## 2021-09-02 DIAGNOSIS — F0280 Dementia in other diseases classified elsewhere without behavioral disturbance: Secondary | ICD-10-CM | POA: Diagnosis not present

## 2021-09-02 DIAGNOSIS — E43 Unspecified severe protein-calorie malnutrition: Secondary | ICD-10-CM | POA: Diagnosis not present

## 2021-09-02 DIAGNOSIS — Z6820 Body mass index (BMI) 20.0-20.9, adult: Secondary | ICD-10-CM | POA: Diagnosis not present

## 2021-09-02 DIAGNOSIS — G311 Senile degeneration of brain, not elsewhere classified: Secondary | ICD-10-CM | POA: Diagnosis not present

## 2021-09-02 DIAGNOSIS — R296 Repeated falls: Secondary | ICD-10-CM | POA: Diagnosis not present

## 2021-09-04 DIAGNOSIS — K219 Gastro-esophageal reflux disease without esophagitis: Secondary | ICD-10-CM | POA: Diagnosis not present

## 2021-09-04 DIAGNOSIS — Z6820 Body mass index (BMI) 20.0-20.9, adult: Secondary | ICD-10-CM | POA: Diagnosis not present

## 2021-09-04 DIAGNOSIS — F0280 Dementia in other diseases classified elsewhere without behavioral disturbance: Secondary | ICD-10-CM | POA: Diagnosis not present

## 2021-09-04 DIAGNOSIS — G311 Senile degeneration of brain, not elsewhere classified: Secondary | ICD-10-CM | POA: Diagnosis not present

## 2021-09-04 DIAGNOSIS — E43 Unspecified severe protein-calorie malnutrition: Secondary | ICD-10-CM | POA: Diagnosis not present

## 2021-09-04 DIAGNOSIS — R296 Repeated falls: Secondary | ICD-10-CM | POA: Diagnosis not present

## 2021-09-05 DIAGNOSIS — K219 Gastro-esophageal reflux disease without esophagitis: Secondary | ICD-10-CM | POA: Diagnosis not present

## 2021-09-05 DIAGNOSIS — R296 Repeated falls: Secondary | ICD-10-CM | POA: Diagnosis not present

## 2021-09-05 DIAGNOSIS — F0280 Dementia in other diseases classified elsewhere without behavioral disturbance: Secondary | ICD-10-CM | POA: Diagnosis not present

## 2021-09-05 DIAGNOSIS — Z6820 Body mass index (BMI) 20.0-20.9, adult: Secondary | ICD-10-CM | POA: Diagnosis not present

## 2021-09-05 DIAGNOSIS — G311 Senile degeneration of brain, not elsewhere classified: Secondary | ICD-10-CM | POA: Diagnosis not present

## 2021-09-05 DIAGNOSIS — E43 Unspecified severe protein-calorie malnutrition: Secondary | ICD-10-CM | POA: Diagnosis not present

## 2021-09-07 DIAGNOSIS — I10 Essential (primary) hypertension: Secondary | ICD-10-CM | POA: Diagnosis not present

## 2021-09-07 DIAGNOSIS — R251 Tremor, unspecified: Secondary | ICD-10-CM | POA: Diagnosis not present

## 2021-09-07 DIAGNOSIS — I480 Paroxysmal atrial fibrillation: Secondary | ICD-10-CM | POA: Diagnosis not present

## 2021-09-07 DIAGNOSIS — F0390 Unspecified dementia without behavioral disturbance: Secondary | ICD-10-CM | POA: Diagnosis not present

## 2021-09-07 DIAGNOSIS — F329 Major depressive disorder, single episode, unspecified: Secondary | ICD-10-CM | POA: Diagnosis not present

## 2021-09-07 DIAGNOSIS — K219 Gastro-esophageal reflux disease without esophagitis: Secondary | ICD-10-CM | POA: Diagnosis not present

## 2021-09-07 DIAGNOSIS — G4089 Other seizures: Secondary | ICD-10-CM | POA: Diagnosis not present

## 2021-09-09 DIAGNOSIS — R296 Repeated falls: Secondary | ICD-10-CM | POA: Diagnosis not present

## 2021-09-09 DIAGNOSIS — K219 Gastro-esophageal reflux disease without esophagitis: Secondary | ICD-10-CM | POA: Diagnosis not present

## 2021-09-09 DIAGNOSIS — G311 Senile degeneration of brain, not elsewhere classified: Secondary | ICD-10-CM | POA: Diagnosis not present

## 2021-09-09 DIAGNOSIS — Z6820 Body mass index (BMI) 20.0-20.9, adult: Secondary | ICD-10-CM | POA: Diagnosis not present

## 2021-09-09 DIAGNOSIS — E43 Unspecified severe protein-calorie malnutrition: Secondary | ICD-10-CM | POA: Diagnosis not present

## 2021-09-09 DIAGNOSIS — F0280 Dementia in other diseases classified elsewhere without behavioral disturbance: Secondary | ICD-10-CM | POA: Diagnosis not present

## 2021-09-10 DIAGNOSIS — K219 Gastro-esophageal reflux disease without esophagitis: Secondary | ICD-10-CM | POA: Diagnosis not present

## 2021-09-10 DIAGNOSIS — F0280 Dementia in other diseases classified elsewhere without behavioral disturbance: Secondary | ICD-10-CM | POA: Diagnosis not present

## 2021-09-10 DIAGNOSIS — N1831 Chronic kidney disease, stage 3a: Secondary | ICD-10-CM | POA: Diagnosis not present

## 2021-09-10 DIAGNOSIS — E538 Deficiency of other specified B group vitamins: Secondary | ICD-10-CM | POA: Diagnosis not present

## 2021-09-10 DIAGNOSIS — H409 Unspecified glaucoma: Secondary | ICD-10-CM | POA: Diagnosis not present

## 2021-09-10 DIAGNOSIS — R296 Repeated falls: Secondary | ICD-10-CM | POA: Diagnosis not present

## 2021-09-10 DIAGNOSIS — Z8719 Personal history of other diseases of the digestive system: Secondary | ICD-10-CM | POA: Diagnosis not present

## 2021-09-10 DIAGNOSIS — L299 Pruritus, unspecified: Secondary | ICD-10-CM | POA: Diagnosis not present

## 2021-09-10 DIAGNOSIS — Z6820 Body mass index (BMI) 20.0-20.9, adult: Secondary | ICD-10-CM | POA: Diagnosis not present

## 2021-09-10 DIAGNOSIS — E43 Unspecified severe protein-calorie malnutrition: Secondary | ICD-10-CM | POA: Diagnosis not present

## 2021-09-10 DIAGNOSIS — D631 Anemia in chronic kidney disease: Secondary | ICD-10-CM | POA: Diagnosis not present

## 2021-09-10 DIAGNOSIS — I4891 Unspecified atrial fibrillation: Secondary | ICD-10-CM | POA: Diagnosis not present

## 2021-09-10 DIAGNOSIS — I129 Hypertensive chronic kidney disease with stage 1 through stage 4 chronic kidney disease, or unspecified chronic kidney disease: Secondary | ICD-10-CM | POA: Diagnosis not present

## 2021-09-10 DIAGNOSIS — E785 Hyperlipidemia, unspecified: Secondary | ICD-10-CM | POA: Diagnosis not present

## 2021-09-10 DIAGNOSIS — G40909 Epilepsy, unspecified, not intractable, without status epilepticus: Secondary | ICD-10-CM | POA: Diagnosis not present

## 2021-09-10 DIAGNOSIS — Z87440 Personal history of urinary (tract) infections: Secondary | ICD-10-CM | POA: Diagnosis not present

## 2021-09-10 DIAGNOSIS — F419 Anxiety disorder, unspecified: Secondary | ICD-10-CM | POA: Diagnosis not present

## 2021-09-10 DIAGNOSIS — G311 Senile degeneration of brain, not elsewhere classified: Secondary | ICD-10-CM | POA: Diagnosis not present

## 2021-09-10 DIAGNOSIS — F32A Depression, unspecified: Secondary | ICD-10-CM | POA: Diagnosis not present

## 2021-09-10 DIAGNOSIS — M810 Age-related osteoporosis without current pathological fracture: Secondary | ICD-10-CM | POA: Diagnosis not present

## 2021-09-11 DIAGNOSIS — Z6820 Body mass index (BMI) 20.0-20.9, adult: Secondary | ICD-10-CM | POA: Diagnosis not present

## 2021-09-11 DIAGNOSIS — E43 Unspecified severe protein-calorie malnutrition: Secondary | ICD-10-CM | POA: Diagnosis not present

## 2021-09-11 DIAGNOSIS — G311 Senile degeneration of brain, not elsewhere classified: Secondary | ICD-10-CM | POA: Diagnosis not present

## 2021-09-11 DIAGNOSIS — F0280 Dementia in other diseases classified elsewhere without behavioral disturbance: Secondary | ICD-10-CM | POA: Diagnosis not present

## 2021-09-11 DIAGNOSIS — K219 Gastro-esophageal reflux disease without esophagitis: Secondary | ICD-10-CM | POA: Diagnosis not present

## 2021-09-11 DIAGNOSIS — R296 Repeated falls: Secondary | ICD-10-CM | POA: Diagnosis not present

## 2021-09-12 DIAGNOSIS — R296 Repeated falls: Secondary | ICD-10-CM | POA: Diagnosis not present

## 2021-09-12 DIAGNOSIS — F0280 Dementia in other diseases classified elsewhere without behavioral disturbance: Secondary | ICD-10-CM | POA: Diagnosis not present

## 2021-09-12 DIAGNOSIS — E43 Unspecified severe protein-calorie malnutrition: Secondary | ICD-10-CM | POA: Diagnosis not present

## 2021-09-12 DIAGNOSIS — K219 Gastro-esophageal reflux disease without esophagitis: Secondary | ICD-10-CM | POA: Diagnosis not present

## 2021-09-12 DIAGNOSIS — G311 Senile degeneration of brain, not elsewhere classified: Secondary | ICD-10-CM | POA: Diagnosis not present

## 2021-09-12 DIAGNOSIS — Z6820 Body mass index (BMI) 20.0-20.9, adult: Secondary | ICD-10-CM | POA: Diagnosis not present

## 2021-09-13 DIAGNOSIS — R296 Repeated falls: Secondary | ICD-10-CM | POA: Diagnosis not present

## 2021-09-13 DIAGNOSIS — K219 Gastro-esophageal reflux disease without esophagitis: Secondary | ICD-10-CM | POA: Diagnosis not present

## 2021-09-13 DIAGNOSIS — F0280 Dementia in other diseases classified elsewhere without behavioral disturbance: Secondary | ICD-10-CM | POA: Diagnosis not present

## 2021-09-13 DIAGNOSIS — Z6820 Body mass index (BMI) 20.0-20.9, adult: Secondary | ICD-10-CM | POA: Diagnosis not present

## 2021-09-13 DIAGNOSIS — G311 Senile degeneration of brain, not elsewhere classified: Secondary | ICD-10-CM | POA: Diagnosis not present

## 2021-09-13 DIAGNOSIS — E43 Unspecified severe protein-calorie malnutrition: Secondary | ICD-10-CM | POA: Diagnosis not present

## 2021-09-16 DIAGNOSIS — R21 Rash and other nonspecific skin eruption: Secondary | ICD-10-CM | POA: Diagnosis not present

## 2021-09-16 DIAGNOSIS — G311 Senile degeneration of brain, not elsewhere classified: Secondary | ICD-10-CM | POA: Diagnosis not present

## 2021-09-16 DIAGNOSIS — R296 Repeated falls: Secondary | ICD-10-CM | POA: Diagnosis not present

## 2021-09-16 DIAGNOSIS — F329 Major depressive disorder, single episode, unspecified: Secondary | ICD-10-CM | POA: Diagnosis not present

## 2021-09-16 DIAGNOSIS — F0390 Unspecified dementia without behavioral disturbance: Secondary | ICD-10-CM | POA: Diagnosis not present

## 2021-09-16 DIAGNOSIS — Z6820 Body mass index (BMI) 20.0-20.9, adult: Secondary | ICD-10-CM | POA: Diagnosis not present

## 2021-09-16 DIAGNOSIS — F0280 Dementia in other diseases classified elsewhere without behavioral disturbance: Secondary | ICD-10-CM | POA: Diagnosis not present

## 2021-09-16 DIAGNOSIS — E43 Unspecified severe protein-calorie malnutrition: Secondary | ICD-10-CM | POA: Diagnosis not present

## 2021-09-16 DIAGNOSIS — M62830 Muscle spasm of back: Secondary | ICD-10-CM | POA: Diagnosis not present

## 2021-09-16 DIAGNOSIS — K219 Gastro-esophageal reflux disease without esophagitis: Secondary | ICD-10-CM | POA: Diagnosis not present

## 2021-09-16 DIAGNOSIS — I480 Paroxysmal atrial fibrillation: Secondary | ICD-10-CM | POA: Diagnosis not present

## 2021-09-18 DIAGNOSIS — K219 Gastro-esophageal reflux disease without esophagitis: Secondary | ICD-10-CM | POA: Diagnosis not present

## 2021-09-18 DIAGNOSIS — Z6820 Body mass index (BMI) 20.0-20.9, adult: Secondary | ICD-10-CM | POA: Diagnosis not present

## 2021-09-18 DIAGNOSIS — F0280 Dementia in other diseases classified elsewhere without behavioral disturbance: Secondary | ICD-10-CM | POA: Diagnosis not present

## 2021-09-18 DIAGNOSIS — R296 Repeated falls: Secondary | ICD-10-CM | POA: Diagnosis not present

## 2021-09-18 DIAGNOSIS — E43 Unspecified severe protein-calorie malnutrition: Secondary | ICD-10-CM | POA: Diagnosis not present

## 2021-09-18 DIAGNOSIS — G311 Senile degeneration of brain, not elsewhere classified: Secondary | ICD-10-CM | POA: Diagnosis not present

## 2021-09-19 DIAGNOSIS — R296 Repeated falls: Secondary | ICD-10-CM | POA: Diagnosis not present

## 2021-09-19 DIAGNOSIS — F0280 Dementia in other diseases classified elsewhere without behavioral disturbance: Secondary | ICD-10-CM | POA: Diagnosis not present

## 2021-09-19 DIAGNOSIS — E43 Unspecified severe protein-calorie malnutrition: Secondary | ICD-10-CM | POA: Diagnosis not present

## 2021-09-19 DIAGNOSIS — K219 Gastro-esophageal reflux disease without esophagitis: Secondary | ICD-10-CM | POA: Diagnosis not present

## 2021-09-19 DIAGNOSIS — G311 Senile degeneration of brain, not elsewhere classified: Secondary | ICD-10-CM | POA: Diagnosis not present

## 2021-09-19 DIAGNOSIS — Z6820 Body mass index (BMI) 20.0-20.9, adult: Secondary | ICD-10-CM | POA: Diagnosis not present

## 2021-09-20 DIAGNOSIS — G311 Senile degeneration of brain, not elsewhere classified: Secondary | ICD-10-CM | POA: Diagnosis not present

## 2021-09-20 DIAGNOSIS — E43 Unspecified severe protein-calorie malnutrition: Secondary | ICD-10-CM | POA: Diagnosis not present

## 2021-09-20 DIAGNOSIS — Z6820 Body mass index (BMI) 20.0-20.9, adult: Secondary | ICD-10-CM | POA: Diagnosis not present

## 2021-09-20 DIAGNOSIS — K219 Gastro-esophageal reflux disease without esophagitis: Secondary | ICD-10-CM | POA: Diagnosis not present

## 2021-09-20 DIAGNOSIS — F0280 Dementia in other diseases classified elsewhere without behavioral disturbance: Secondary | ICD-10-CM | POA: Diagnosis not present

## 2021-09-20 DIAGNOSIS — R296 Repeated falls: Secondary | ICD-10-CM | POA: Diagnosis not present

## 2021-09-24 DIAGNOSIS — K219 Gastro-esophageal reflux disease without esophagitis: Secondary | ICD-10-CM | POA: Diagnosis not present

## 2021-09-24 DIAGNOSIS — R296 Repeated falls: Secondary | ICD-10-CM | POA: Diagnosis not present

## 2021-09-24 DIAGNOSIS — G311 Senile degeneration of brain, not elsewhere classified: Secondary | ICD-10-CM | POA: Diagnosis not present

## 2021-09-24 DIAGNOSIS — F0280 Dementia in other diseases classified elsewhere without behavioral disturbance: Secondary | ICD-10-CM | POA: Diagnosis not present

## 2021-09-24 DIAGNOSIS — E43 Unspecified severe protein-calorie malnutrition: Secondary | ICD-10-CM | POA: Diagnosis not present

## 2021-09-24 DIAGNOSIS — Z6820 Body mass index (BMI) 20.0-20.9, adult: Secondary | ICD-10-CM | POA: Diagnosis not present

## 2021-09-25 DIAGNOSIS — E43 Unspecified severe protein-calorie malnutrition: Secondary | ICD-10-CM | POA: Diagnosis not present

## 2021-09-25 DIAGNOSIS — F0280 Dementia in other diseases classified elsewhere without behavioral disturbance: Secondary | ICD-10-CM | POA: Diagnosis not present

## 2021-09-25 DIAGNOSIS — Z6820 Body mass index (BMI) 20.0-20.9, adult: Secondary | ICD-10-CM | POA: Diagnosis not present

## 2021-09-25 DIAGNOSIS — R296 Repeated falls: Secondary | ICD-10-CM | POA: Diagnosis not present

## 2021-09-25 DIAGNOSIS — K219 Gastro-esophageal reflux disease without esophagitis: Secondary | ICD-10-CM | POA: Diagnosis not present

## 2021-09-25 DIAGNOSIS — G311 Senile degeneration of brain, not elsewhere classified: Secondary | ICD-10-CM | POA: Diagnosis not present

## 2021-09-26 DIAGNOSIS — Z6820 Body mass index (BMI) 20.0-20.9, adult: Secondary | ICD-10-CM | POA: Diagnosis not present

## 2021-09-26 DIAGNOSIS — R296 Repeated falls: Secondary | ICD-10-CM | POA: Diagnosis not present

## 2021-09-26 DIAGNOSIS — G311 Senile degeneration of brain, not elsewhere classified: Secondary | ICD-10-CM | POA: Diagnosis not present

## 2021-09-26 DIAGNOSIS — F0280 Dementia in other diseases classified elsewhere without behavioral disturbance: Secondary | ICD-10-CM | POA: Diagnosis not present

## 2021-09-26 DIAGNOSIS — K219 Gastro-esophageal reflux disease without esophagitis: Secondary | ICD-10-CM | POA: Diagnosis not present

## 2021-09-26 DIAGNOSIS — E43 Unspecified severe protein-calorie malnutrition: Secondary | ICD-10-CM | POA: Diagnosis not present

## 2021-09-26 IMAGING — CT CT HEAD W/O CM
3 series · 15 of 45 positions shown, 18 images · non-contrast
Comparison: CT head dated January 03, 2021. CT head and cervical
spine dated July 30, 2020.

CLINICAL DATA: Unwitnessed slide out of her wheelchair.

EXAM:
CT HEAD WITHOUT CONTRAST
CT CERVICAL SPINE WITHOUT CONTRAST
TECHNIQUE: Multidetector CT imaging of the head and cervical spine was
performed following the standard protocol without intravenous
contrast. Multiplanar CT image reconstructions of the cervical spine
were also generated.

[Series 3: head wo · axial · 0.41mm/px · z∈[+1104,+1219]mm · 9 of 28 slices shown, 12 images]
[im 3/28  brain]
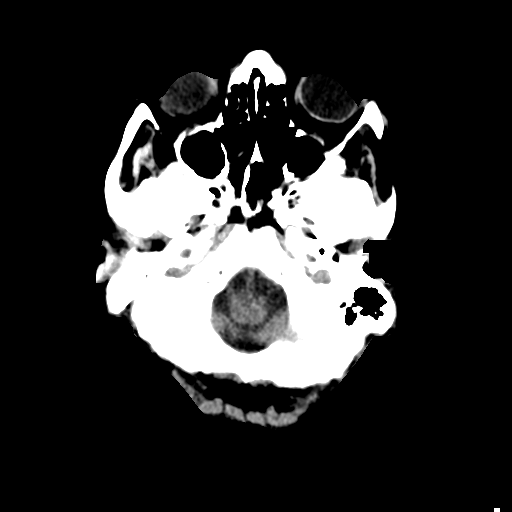
[im 3/28  bone]
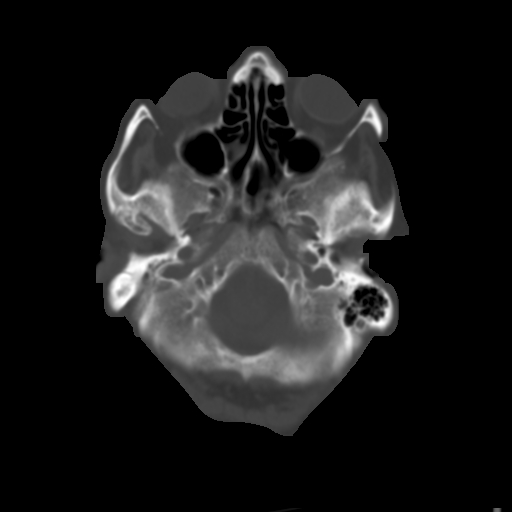
[im 6/28  brain]
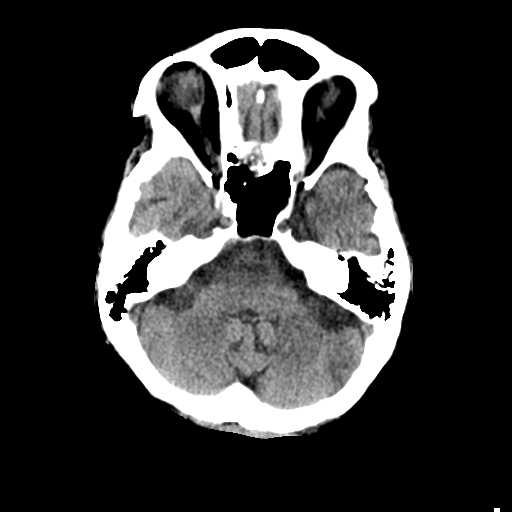
[im 9/28  brain]
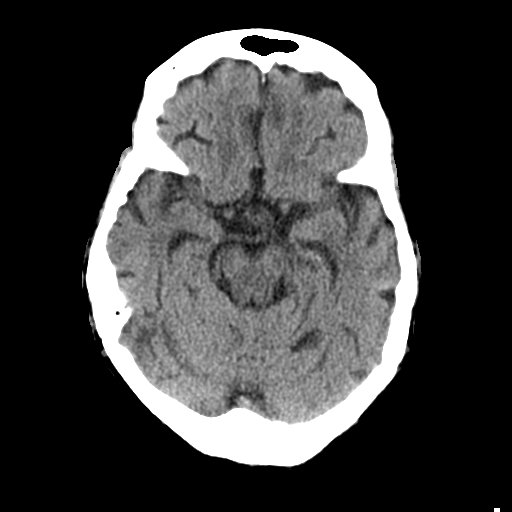
[im 12/28  brain]
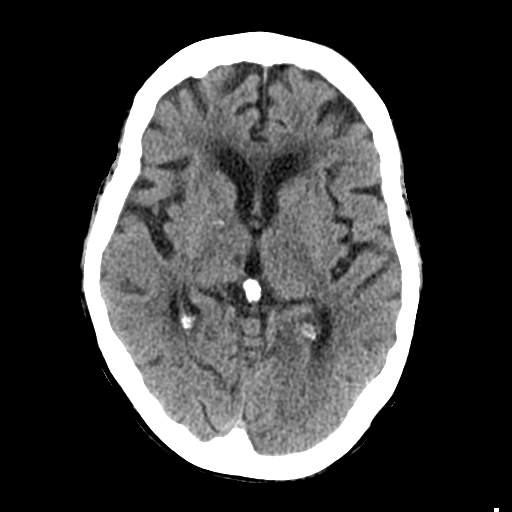
[im 15/28  brain]
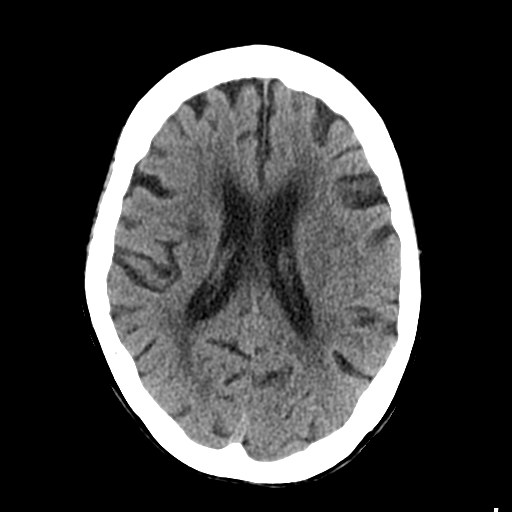
[im 15/28  bone]
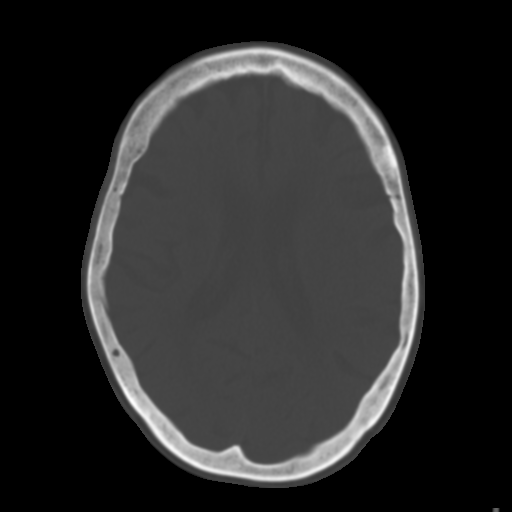
[im 17/28  brain]
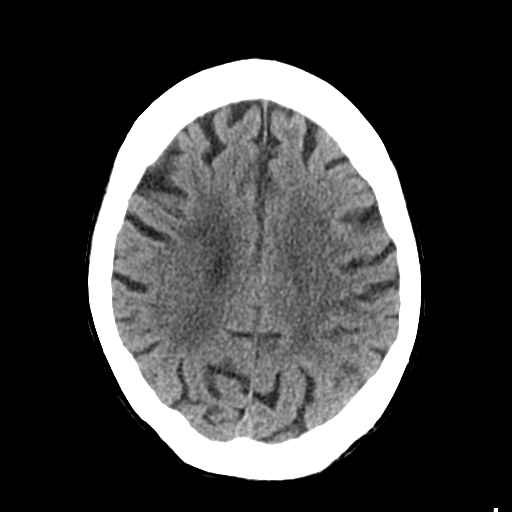
[im 20/28  brain]
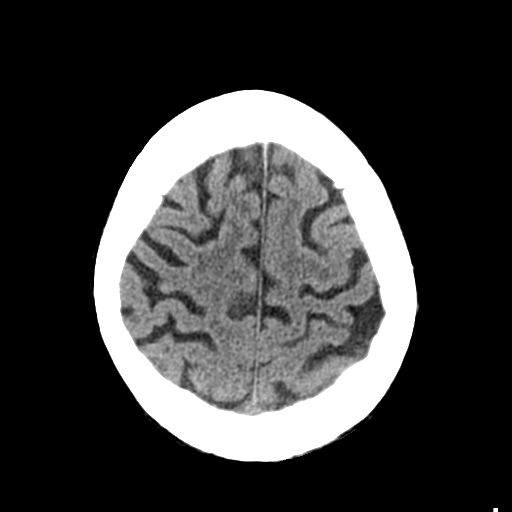
[im 23/28  brain]
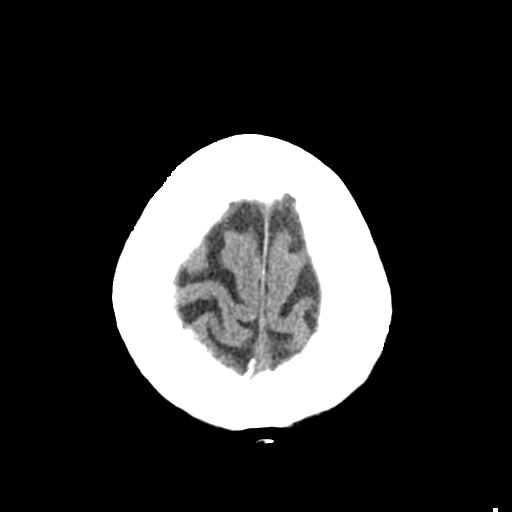
[im 26/28  brain]
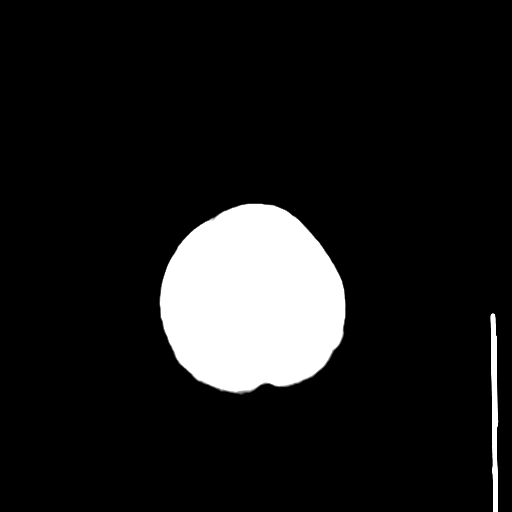
[im 26/28  bone]
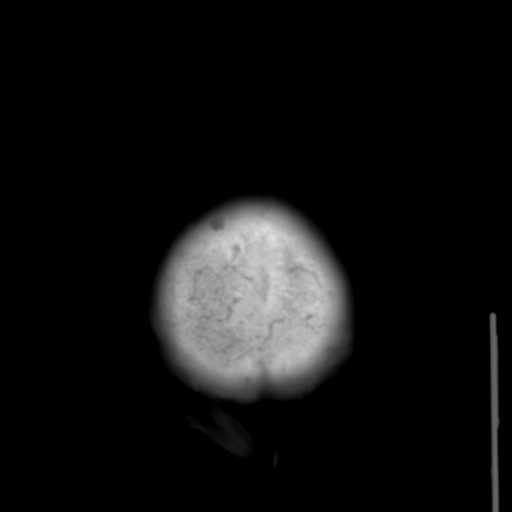

[Series 6: coronal soft tissue · coronal · 0.27mm/px · 3 of 65 slices shown]
[im 22/65  brain]
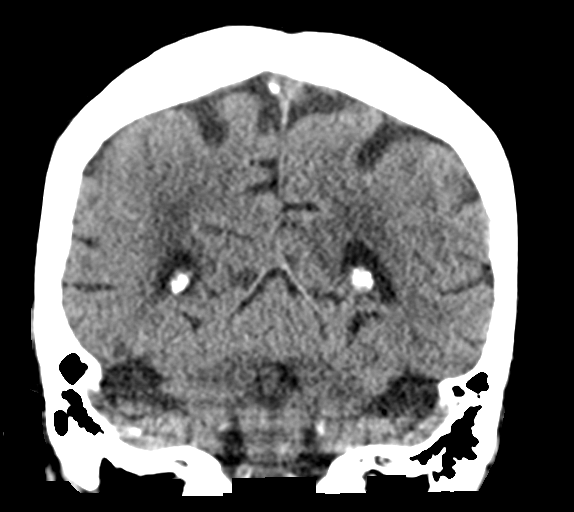
[im 29/65  brain]
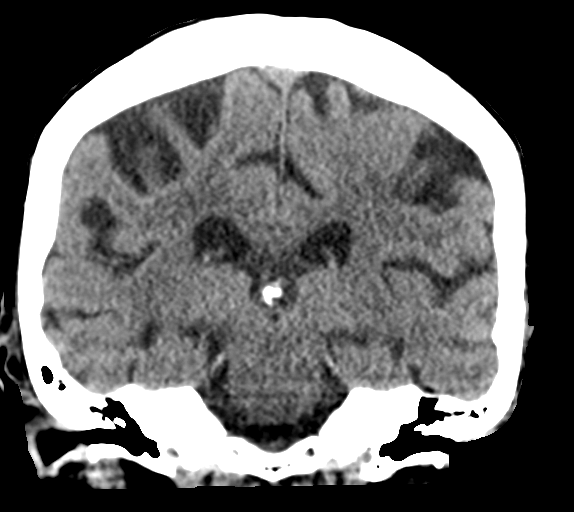
[im 36/65  brain]
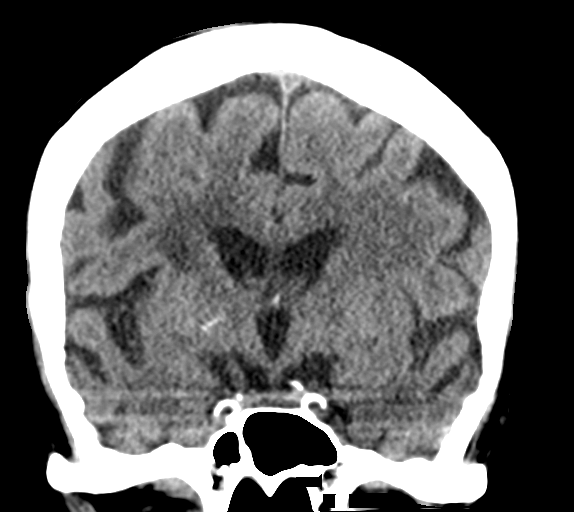

[Series 7: sagittal soft tissue · sagittal · 0.27mm/px · 3 of 53 slices shown]
[im 18/53  brain]
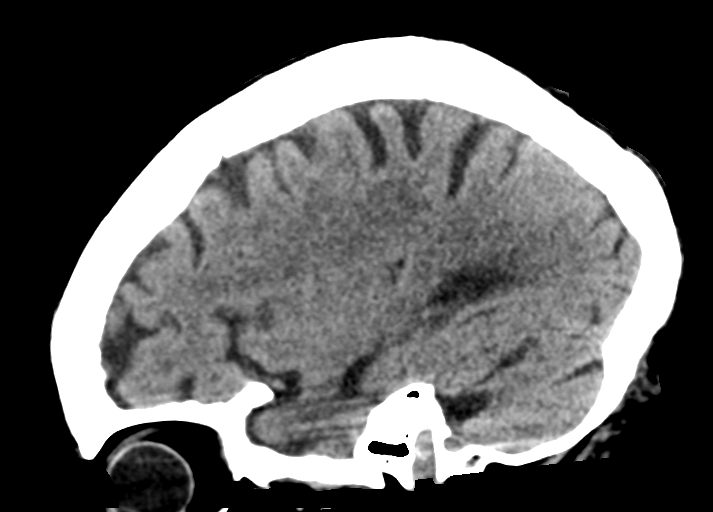
[im 27/53  brain]
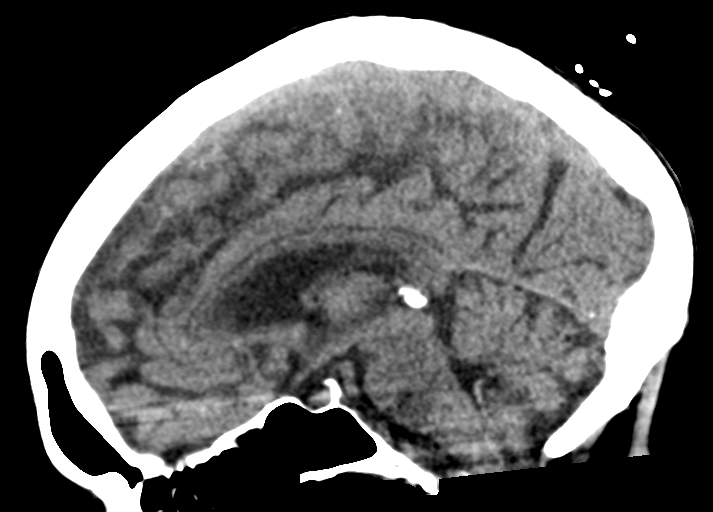
[im 35/53  brain]
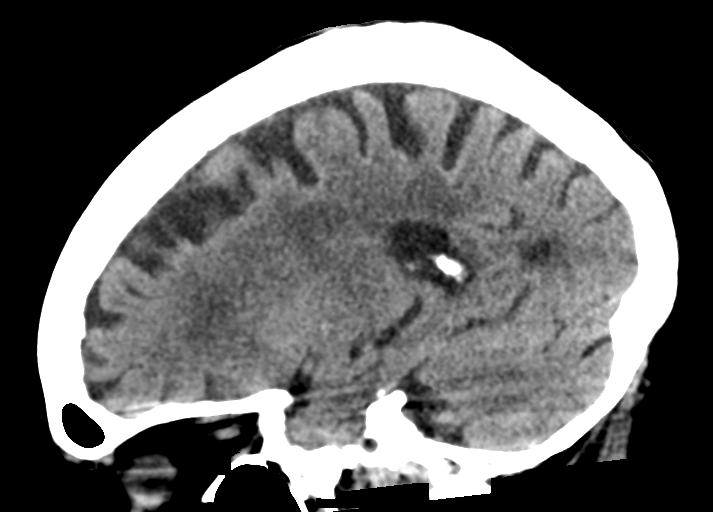

[15 of 45 positions shown; findings below may reference images not displayed]

FINDINGS: CT HEAD FINDINGS

Brain: No evidence of acute infarction, hemorrhage, hydrocephalus,
extra-axial collection or mass lesion/mass effect. Stable atrophy
and chronic microvascular ischemic changes.

Vascular: Calcified atherosclerosis at the skullbase. No hyperdense
vessel.

Skull: Normal. Negative for fracture or focal lesion.

Sinuses/Orbits: No acute finding.

Other: Tiny midline frontal scalp hematoma.

CT CERVICAL SPINE FINDINGS

Alignment: Normal.

Skull base and vertebrae: No acute fracture. No primary bone lesion
or focal pathologic process.

Soft tissues and spinal canal: No prevertebral fluid or swelling. No
visible canal hematoma.

Disc levels: Unchanged mild C4-C5 and mild-to-moderate C5-C6 disc
height loss and moderate right greater than left uncovertebral
hypertrophy. Unchanged mild facet arthropathy throughout the
cervical spine.

Upper chest: Minimal dependent atelectasis.

Other: None.
IMPRESSION: 1. No acute intracranial abnormality. Tiny midline frontal scalp
hematoma.
2. No acute cervical spine fracture or traumatic listhesis.

## 2021-09-26 IMAGING — CT CT CERVICAL SPINE W/O CM
3 of 4 series · 10 of 33 positions shown, 12 images · non-contrast
Comparison: CT head dated January 03, 2021. CT head and cervical
spine dated July 30, 2020.

CLINICAL DATA: Unwitnessed slide out of her wheelchair.

EXAM:
CT HEAD WITHOUT CONTRAST
CT CERVICAL SPINE WITHOUT CONTRAST
TECHNIQUE: Multidetector CT imaging of the head and cervical spine was
performed following the standard protocol without intravenous
contrast. Multiplanar CT image reconstructions of the cervical spine
were also generated.

[Series 6: orthogonal bone · axial · 0.20mm/px · z∈[+969,+1044]mm · 2 of 94 slices shown, 3 images]
[im 27/94  soft-tissue]
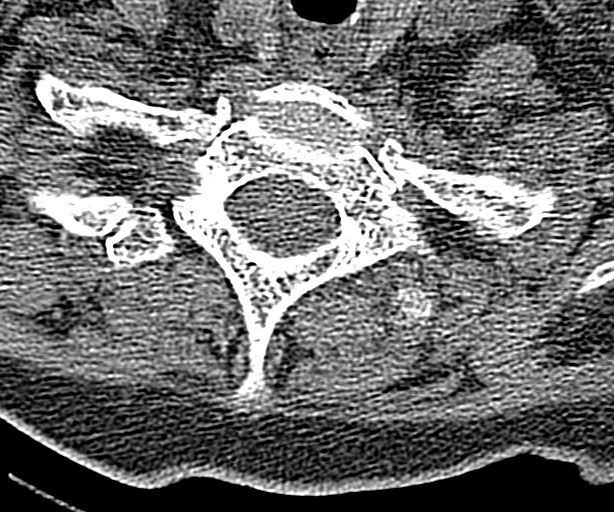
[im 27/94  bone]
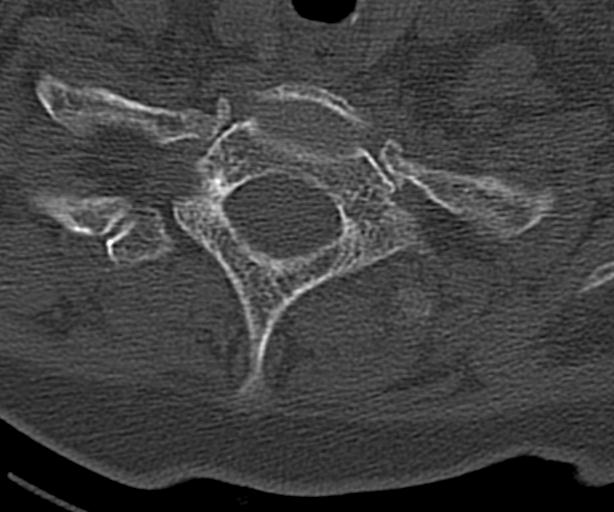
[im 67/94  bone]
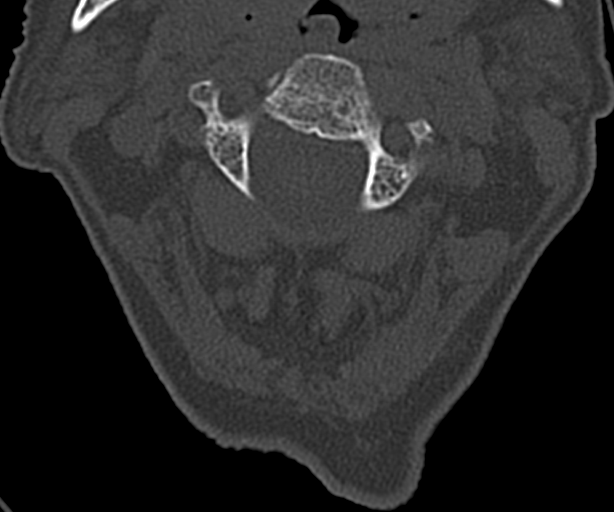

[Series 7: coronal bone · coronal · 0.23mm/px · 3 of 51 slices shown]
[im 11/51  bone]
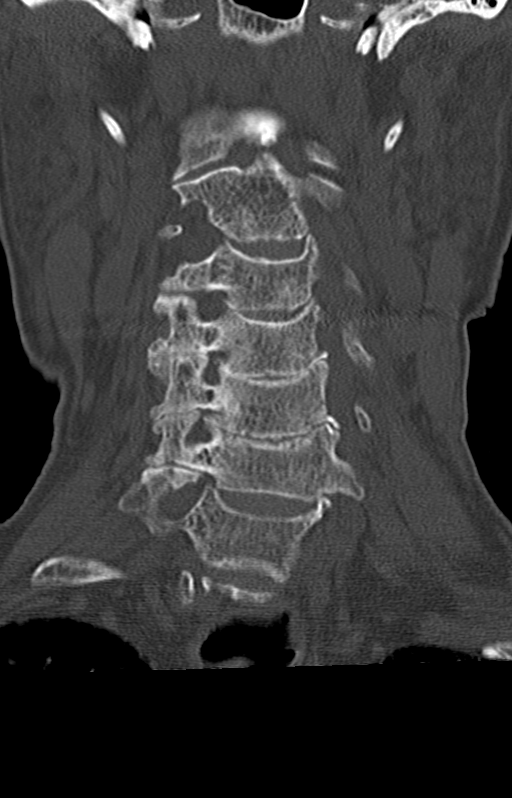
[im 21/51  bone]
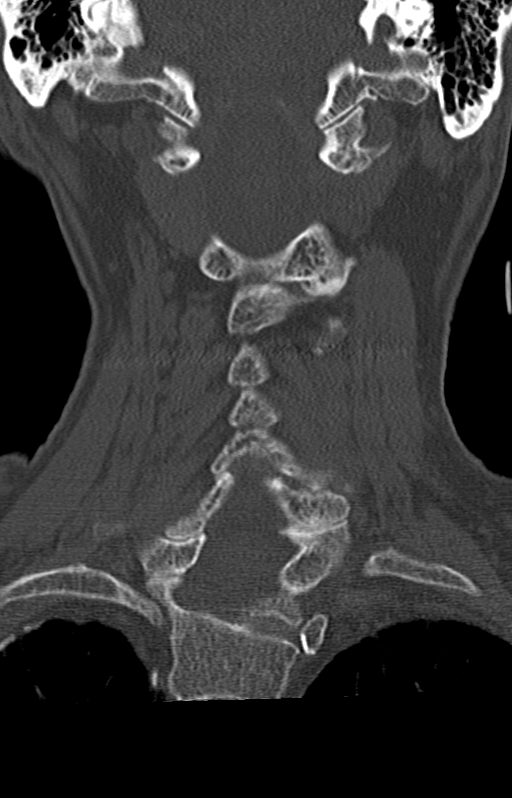
[im 31/51  bone]
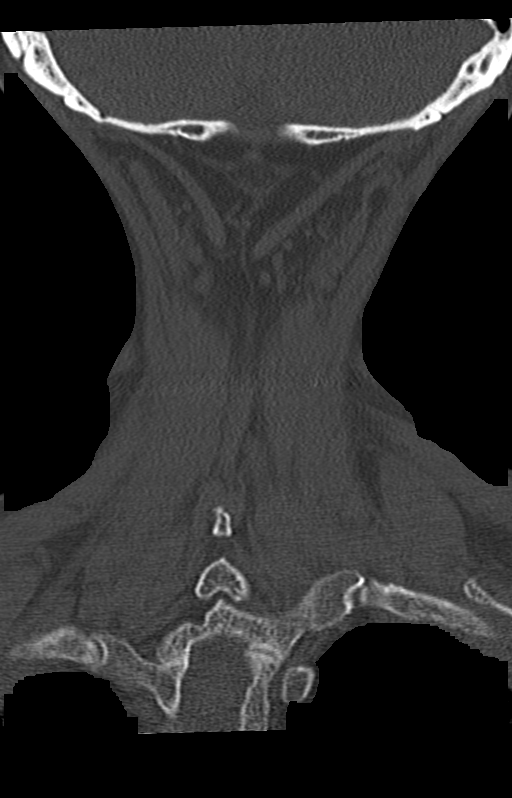

[Series 8: sagittal bone · sagittal · 0.20mm/px · 5 of 61 slices shown, 6 images]
[im 21/61  bone]
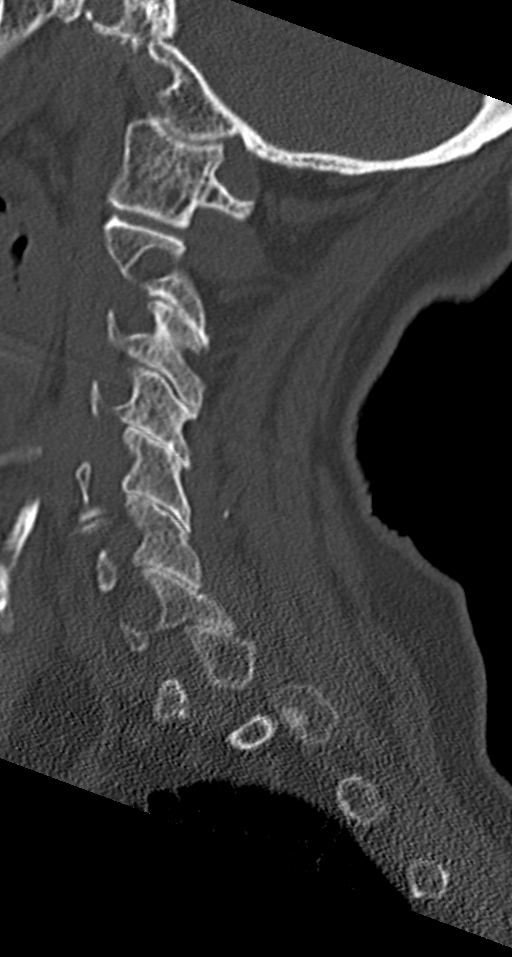
[im 26/61  bone]
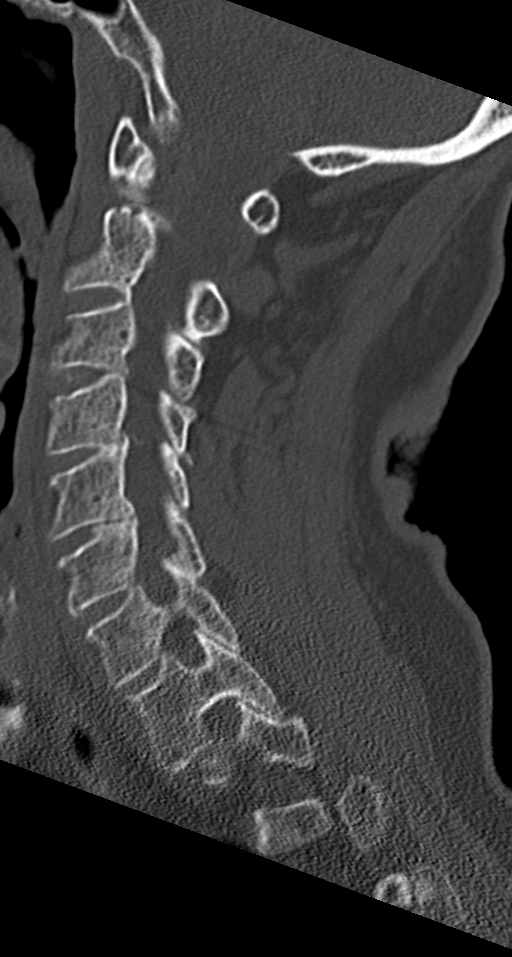
[im 31/61  soft-tissue]
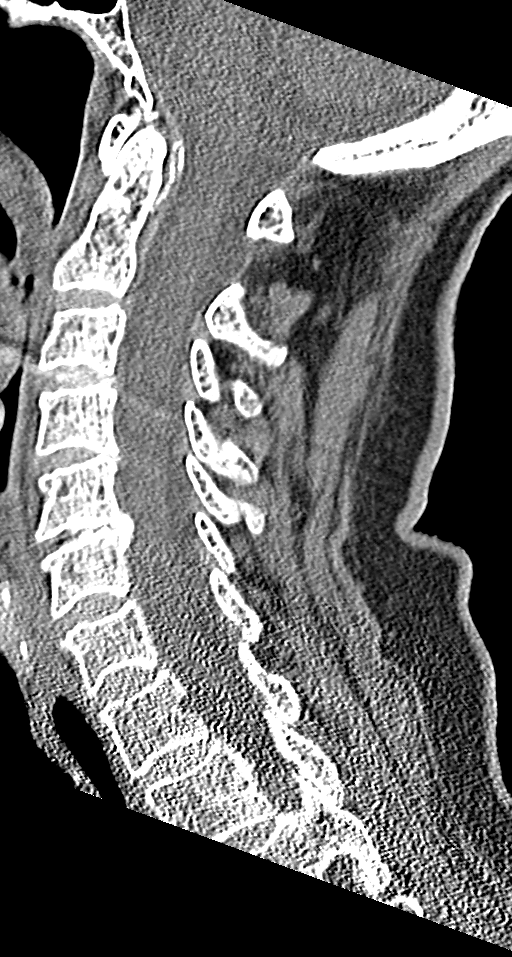
[im 31/61  bone]
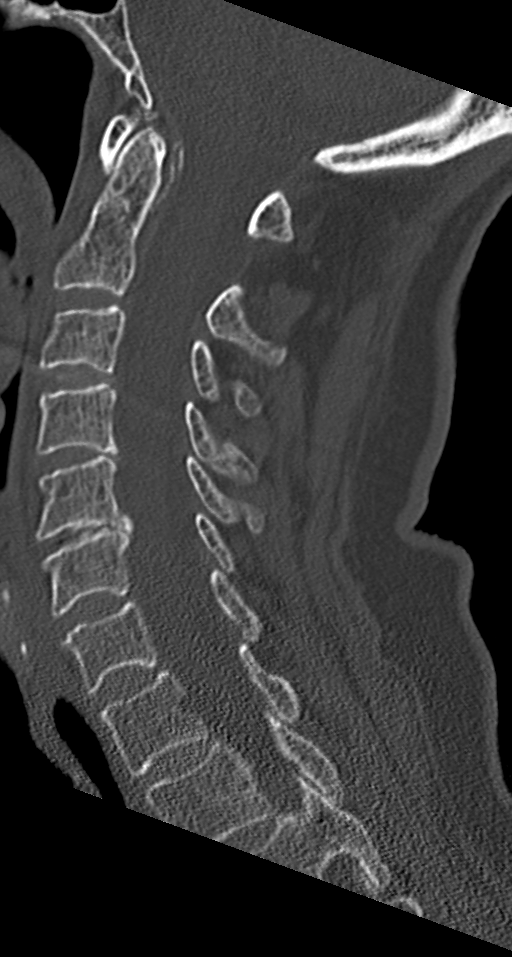
[im 36/61  bone]
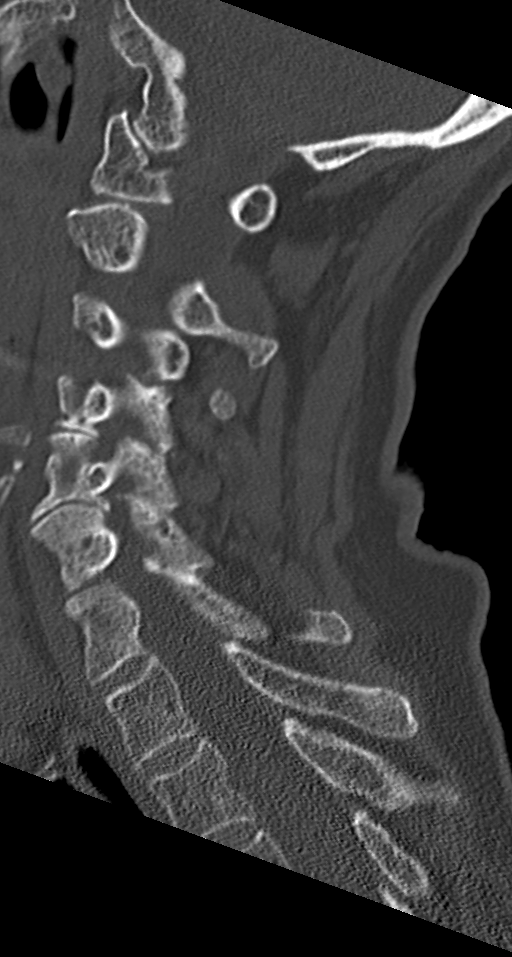
[im 41/61  bone]
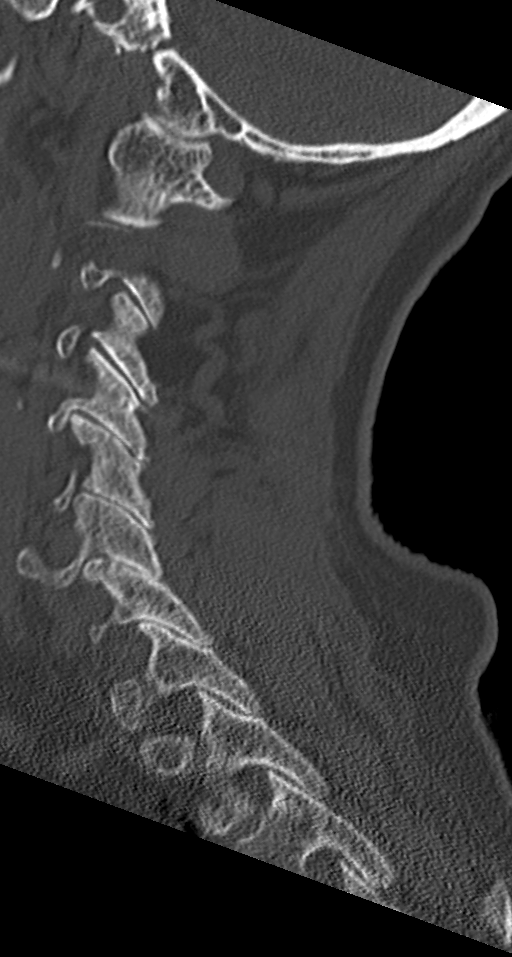

[10 of 33 positions shown; findings below may reference images not displayed]

FINDINGS: CT HEAD FINDINGS

Brain: No evidence of acute infarction, hemorrhage, hydrocephalus,
extra-axial collection or mass lesion/mass effect. Stable atrophy
and chronic microvascular ischemic changes.

Vascular: Calcified atherosclerosis at the skullbase. No hyperdense
vessel.

Skull: Normal. Negative for fracture or focal lesion.

Sinuses/Orbits: No acute finding.

Other: Tiny midline frontal scalp hematoma.

CT CERVICAL SPINE FINDINGS

Alignment: Normal.

Skull base and vertebrae: No acute fracture. No primary bone lesion
or focal pathologic process.

Soft tissues and spinal canal: No prevertebral fluid or swelling. No
visible canal hematoma.

Disc levels: Unchanged mild C4-C5 and mild-to-moderate C5-C6 disc
height loss and moderate right greater than left uncovertebral
hypertrophy. Unchanged mild facet arthropathy throughout the
cervical spine.

Upper chest: Minimal dependent atelectasis.

Other: None.
IMPRESSION: 1. No acute intracranial abnormality. Tiny midline frontal scalp
hematoma.
2. No acute cervical spine fracture or traumatic listhesis.

## 2021-09-27 DIAGNOSIS — F329 Major depressive disorder, single episode, unspecified: Secondary | ICD-10-CM | POA: Diagnosis not present

## 2021-09-27 DIAGNOSIS — I480 Paroxysmal atrial fibrillation: Secondary | ICD-10-CM | POA: Diagnosis not present

## 2021-09-27 DIAGNOSIS — F0390 Unspecified dementia without behavioral disturbance: Secondary | ICD-10-CM | POA: Diagnosis not present

## 2021-09-27 DIAGNOSIS — R21 Rash and other nonspecific skin eruption: Secondary | ICD-10-CM | POA: Diagnosis not present

## 2021-09-30 DIAGNOSIS — Z6820 Body mass index (BMI) 20.0-20.9, adult: Secondary | ICD-10-CM | POA: Diagnosis not present

## 2021-09-30 DIAGNOSIS — E43 Unspecified severe protein-calorie malnutrition: Secondary | ICD-10-CM | POA: Diagnosis not present

## 2021-09-30 DIAGNOSIS — R296 Repeated falls: Secondary | ICD-10-CM | POA: Diagnosis not present

## 2021-09-30 DIAGNOSIS — K219 Gastro-esophageal reflux disease without esophagitis: Secondary | ICD-10-CM | POA: Diagnosis not present

## 2021-09-30 DIAGNOSIS — F0280 Dementia in other diseases classified elsewhere without behavioral disturbance: Secondary | ICD-10-CM | POA: Diagnosis not present

## 2021-09-30 DIAGNOSIS — G311 Senile degeneration of brain, not elsewhere classified: Secondary | ICD-10-CM | POA: Diagnosis not present

## 2021-10-02 DIAGNOSIS — K219 Gastro-esophageal reflux disease without esophagitis: Secondary | ICD-10-CM | POA: Diagnosis not present

## 2021-10-02 DIAGNOSIS — Z6820 Body mass index (BMI) 20.0-20.9, adult: Secondary | ICD-10-CM | POA: Diagnosis not present

## 2021-10-02 DIAGNOSIS — F0280 Dementia in other diseases classified elsewhere without behavioral disturbance: Secondary | ICD-10-CM | POA: Diagnosis not present

## 2021-10-02 DIAGNOSIS — G311 Senile degeneration of brain, not elsewhere classified: Secondary | ICD-10-CM | POA: Diagnosis not present

## 2021-10-02 DIAGNOSIS — E43 Unspecified severe protein-calorie malnutrition: Secondary | ICD-10-CM | POA: Diagnosis not present

## 2021-10-02 DIAGNOSIS — R296 Repeated falls: Secondary | ICD-10-CM | POA: Diagnosis not present

## 2021-10-03 DIAGNOSIS — E43 Unspecified severe protein-calorie malnutrition: Secondary | ICD-10-CM | POA: Diagnosis not present

## 2021-10-03 DIAGNOSIS — K219 Gastro-esophageal reflux disease without esophagitis: Secondary | ICD-10-CM | POA: Diagnosis not present

## 2021-10-03 DIAGNOSIS — R296 Repeated falls: Secondary | ICD-10-CM | POA: Diagnosis not present

## 2021-10-03 DIAGNOSIS — Z6820 Body mass index (BMI) 20.0-20.9, adult: Secondary | ICD-10-CM | POA: Diagnosis not present

## 2021-10-03 DIAGNOSIS — F0280 Dementia in other diseases classified elsewhere without behavioral disturbance: Secondary | ICD-10-CM | POA: Diagnosis not present

## 2021-10-03 DIAGNOSIS — G311 Senile degeneration of brain, not elsewhere classified: Secondary | ICD-10-CM | POA: Diagnosis not present

## 2021-10-07 ENCOUNTER — Other Ambulatory Visit: Payer: Self-pay

## 2021-10-07 ENCOUNTER — Ambulatory Visit (INDEPENDENT_AMBULATORY_CARE_PROVIDER_SITE_OTHER): Payer: Medicare Other | Admitting: Podiatry

## 2021-10-07 ENCOUNTER — Encounter: Payer: Self-pay | Admitting: Podiatry

## 2021-10-07 DIAGNOSIS — G311 Senile degeneration of brain, not elsewhere classified: Secondary | ICD-10-CM | POA: Diagnosis not present

## 2021-10-07 DIAGNOSIS — M79674 Pain in right toe(s): Secondary | ICD-10-CM | POA: Diagnosis not present

## 2021-10-07 DIAGNOSIS — F0280 Dementia in other diseases classified elsewhere without behavioral disturbance: Secondary | ICD-10-CM | POA: Diagnosis not present

## 2021-10-07 DIAGNOSIS — M79675 Pain in left toe(s): Secondary | ICD-10-CM | POA: Diagnosis not present

## 2021-10-07 DIAGNOSIS — B351 Tinea unguium: Secondary | ICD-10-CM | POA: Diagnosis not present

## 2021-10-07 DIAGNOSIS — N179 Acute kidney failure, unspecified: Secondary | ICD-10-CM | POA: Diagnosis not present

## 2021-10-07 DIAGNOSIS — K219 Gastro-esophageal reflux disease without esophagitis: Secondary | ICD-10-CM | POA: Diagnosis not present

## 2021-10-07 DIAGNOSIS — R296 Repeated falls: Secondary | ICD-10-CM | POA: Diagnosis not present

## 2021-10-07 DIAGNOSIS — N1831 Chronic kidney disease, stage 3a: Secondary | ICD-10-CM | POA: Diagnosis not present

## 2021-10-07 DIAGNOSIS — E43 Unspecified severe protein-calorie malnutrition: Secondary | ICD-10-CM | POA: Diagnosis not present

## 2021-10-07 DIAGNOSIS — Z6820 Body mass index (BMI) 20.0-20.9, adult: Secondary | ICD-10-CM | POA: Diagnosis not present

## 2021-10-07 NOTE — Progress Notes (Signed)
This patient returns to my office for at risk foot care.  This patient requires this care by a professional since this patient will be at risk due to having acute renal disease. This patient is brought to the office by her daughter in a wheelchair.  This patient is unable to cut nails herself since the patient cannot reach hernails.These nails are painful walking and wearing shoes.  This patient presents for at risk foot care today.  General Appearance  Alert, conversant and in no acute stress.  Vascular  Dorsalis pedis and posterior tibial  pulses are  weakly palpable  bilaterally.  Capillary return is within normal limits  bilaterally. Cold feet.bilaterally. Absent digital hair  B/l.  Neurologic  Senn-Weinstein monofilament wire test within normal limits  bilaterally. Muscle power within normal limits bilaterally.  Nails Thick disfigured discolored nails with subungual debris  from hallux to fifth toes bilaterally. No evidence of bacterial infection or drainage bilaterally.  Orthopedic  No limitations of motion  feet .  No crepitus or effusions noted.  No bony pathology or digital deformities noted.  Skin  normotropic skin with no porokeratosis noted bilaterally.  No signs of infections or ulcers noted.     Onychomycosis  Pain in right toes  Pain in left toes  Consent was obtained for treatment procedures.   Mechanical debridement of nails 1-5  bilaterally performed with a nail nipper.  Filed with dremel without incident.    Return office visit     4 months                 Told patient to return for periodic foot care and evaluation due to potential at risk complications.   Helane Gunther DPM

## 2021-10-08 DIAGNOSIS — K219 Gastro-esophageal reflux disease without esophagitis: Secondary | ICD-10-CM | POA: Diagnosis not present

## 2021-10-08 DIAGNOSIS — E43 Unspecified severe protein-calorie malnutrition: Secondary | ICD-10-CM | POA: Diagnosis not present

## 2021-10-08 DIAGNOSIS — F0280 Dementia in other diseases classified elsewhere without behavioral disturbance: Secondary | ICD-10-CM | POA: Diagnosis not present

## 2021-10-08 DIAGNOSIS — R296 Repeated falls: Secondary | ICD-10-CM | POA: Diagnosis not present

## 2021-10-08 DIAGNOSIS — Z6820 Body mass index (BMI) 20.0-20.9, adult: Secondary | ICD-10-CM | POA: Diagnosis not present

## 2021-10-08 DIAGNOSIS — G311 Senile degeneration of brain, not elsewhere classified: Secondary | ICD-10-CM | POA: Diagnosis not present

## 2021-10-09 DIAGNOSIS — R296 Repeated falls: Secondary | ICD-10-CM | POA: Diagnosis not present

## 2021-10-09 DIAGNOSIS — G311 Senile degeneration of brain, not elsewhere classified: Secondary | ICD-10-CM | POA: Diagnosis not present

## 2021-10-09 DIAGNOSIS — F0280 Dementia in other diseases classified elsewhere without behavioral disturbance: Secondary | ICD-10-CM | POA: Diagnosis not present

## 2021-10-09 DIAGNOSIS — K219 Gastro-esophageal reflux disease without esophagitis: Secondary | ICD-10-CM | POA: Diagnosis not present

## 2021-10-09 DIAGNOSIS — E43 Unspecified severe protein-calorie malnutrition: Secondary | ICD-10-CM | POA: Diagnosis not present

## 2021-10-09 DIAGNOSIS — Z6820 Body mass index (BMI) 20.0-20.9, adult: Secondary | ICD-10-CM | POA: Diagnosis not present

## 2021-10-10 DIAGNOSIS — H409 Unspecified glaucoma: Secondary | ICD-10-CM | POA: Diagnosis not present

## 2021-10-10 DIAGNOSIS — F0283 Dementia in other diseases classified elsewhere, unspecified severity, with mood disturbance: Secondary | ICD-10-CM | POA: Diagnosis not present

## 2021-10-10 DIAGNOSIS — M810 Age-related osteoporosis without current pathological fracture: Secondary | ICD-10-CM | POA: Diagnosis not present

## 2021-10-10 DIAGNOSIS — Z6820 Body mass index (BMI) 20.0-20.9, adult: Secondary | ICD-10-CM | POA: Diagnosis not present

## 2021-10-10 DIAGNOSIS — I4891 Unspecified atrial fibrillation: Secondary | ICD-10-CM | POA: Diagnosis not present

## 2021-10-10 DIAGNOSIS — E785 Hyperlipidemia, unspecified: Secondary | ICD-10-CM | POA: Diagnosis not present

## 2021-10-10 DIAGNOSIS — I129 Hypertensive chronic kidney disease with stage 1 through stage 4 chronic kidney disease, or unspecified chronic kidney disease: Secondary | ICD-10-CM | POA: Diagnosis not present

## 2021-10-10 DIAGNOSIS — G311 Senile degeneration of brain, not elsewhere classified: Secondary | ICD-10-CM | POA: Diagnosis not present

## 2021-10-10 DIAGNOSIS — Z87440 Personal history of urinary (tract) infections: Secondary | ICD-10-CM | POA: Diagnosis not present

## 2021-10-10 DIAGNOSIS — G40909 Epilepsy, unspecified, not intractable, without status epilepticus: Secondary | ICD-10-CM | POA: Diagnosis not present

## 2021-10-10 DIAGNOSIS — Z8719 Personal history of other diseases of the digestive system: Secondary | ICD-10-CM | POA: Diagnosis not present

## 2021-10-10 DIAGNOSIS — K219 Gastro-esophageal reflux disease without esophagitis: Secondary | ICD-10-CM | POA: Diagnosis not present

## 2021-10-10 DIAGNOSIS — D631 Anemia in chronic kidney disease: Secondary | ICD-10-CM | POA: Diagnosis not present

## 2021-10-10 DIAGNOSIS — R296 Repeated falls: Secondary | ICD-10-CM | POA: Diagnosis not present

## 2021-10-10 DIAGNOSIS — F0284 Dementia in other diseases classified elsewhere, unspecified severity, with anxiety: Secondary | ICD-10-CM | POA: Diagnosis not present

## 2021-10-10 DIAGNOSIS — E43 Unspecified severe protein-calorie malnutrition: Secondary | ICD-10-CM | POA: Diagnosis not present

## 2021-10-10 DIAGNOSIS — E538 Deficiency of other specified B group vitamins: Secondary | ICD-10-CM | POA: Diagnosis not present

## 2021-10-10 DIAGNOSIS — N1831 Chronic kidney disease, stage 3a: Secondary | ICD-10-CM | POA: Diagnosis not present

## 2021-10-10 DIAGNOSIS — L299 Pruritus, unspecified: Secondary | ICD-10-CM | POA: Diagnosis not present

## 2021-10-13 DIAGNOSIS — F0390 Unspecified dementia without behavioral disturbance: Secondary | ICD-10-CM | POA: Diagnosis not present

## 2021-10-13 DIAGNOSIS — I480 Paroxysmal atrial fibrillation: Secondary | ICD-10-CM | POA: Diagnosis not present

## 2021-10-13 DIAGNOSIS — I10 Essential (primary) hypertension: Secondary | ICD-10-CM | POA: Diagnosis not present

## 2021-10-13 DIAGNOSIS — F329 Major depressive disorder, single episode, unspecified: Secondary | ICD-10-CM | POA: Diagnosis not present

## 2021-10-13 DIAGNOSIS — G4089 Other seizures: Secondary | ICD-10-CM | POA: Diagnosis not present

## 2021-10-14 DIAGNOSIS — E43 Unspecified severe protein-calorie malnutrition: Secondary | ICD-10-CM | POA: Diagnosis not present

## 2021-10-14 DIAGNOSIS — F0283 Dementia in other diseases classified elsewhere, unspecified severity, with mood disturbance: Secondary | ICD-10-CM | POA: Diagnosis not present

## 2021-10-14 DIAGNOSIS — R296 Repeated falls: Secondary | ICD-10-CM | POA: Diagnosis not present

## 2021-10-14 DIAGNOSIS — G311 Senile degeneration of brain, not elsewhere classified: Secondary | ICD-10-CM | POA: Diagnosis not present

## 2021-10-14 DIAGNOSIS — Z6820 Body mass index (BMI) 20.0-20.9, adult: Secondary | ICD-10-CM | POA: Diagnosis not present

## 2021-10-14 DIAGNOSIS — F0284 Dementia in other diseases classified elsewhere, unspecified severity, with anxiety: Secondary | ICD-10-CM | POA: Diagnosis not present

## 2021-10-16 DIAGNOSIS — R21 Rash and other nonspecific skin eruption: Secondary | ICD-10-CM | POA: Diagnosis not present

## 2021-10-16 DIAGNOSIS — E43 Unspecified severe protein-calorie malnutrition: Secondary | ICD-10-CM | POA: Diagnosis not present

## 2021-10-16 DIAGNOSIS — K219 Gastro-esophageal reflux disease without esophagitis: Secondary | ICD-10-CM | POA: Diagnosis not present

## 2021-10-16 DIAGNOSIS — F0284 Dementia in other diseases classified elsewhere, unspecified severity, with anxiety: Secondary | ICD-10-CM | POA: Diagnosis not present

## 2021-10-16 DIAGNOSIS — F0390 Unspecified dementia without behavioral disturbance: Secondary | ICD-10-CM | POA: Diagnosis not present

## 2021-10-16 DIAGNOSIS — Z6820 Body mass index (BMI) 20.0-20.9, adult: Secondary | ICD-10-CM | POA: Diagnosis not present

## 2021-10-16 DIAGNOSIS — R296 Repeated falls: Secondary | ICD-10-CM | POA: Diagnosis not present

## 2021-10-16 DIAGNOSIS — F329 Major depressive disorder, single episode, unspecified: Secondary | ICD-10-CM | POA: Diagnosis not present

## 2021-10-16 DIAGNOSIS — G311 Senile degeneration of brain, not elsewhere classified: Secondary | ICD-10-CM | POA: Diagnosis not present

## 2021-10-16 DIAGNOSIS — F0283 Dementia in other diseases classified elsewhere, unspecified severity, with mood disturbance: Secondary | ICD-10-CM | POA: Diagnosis not present

## 2021-10-16 DIAGNOSIS — I480 Paroxysmal atrial fibrillation: Secondary | ICD-10-CM | POA: Diagnosis not present

## 2021-10-17 DIAGNOSIS — F0283 Dementia in other diseases classified elsewhere, unspecified severity, with mood disturbance: Secondary | ICD-10-CM | POA: Diagnosis not present

## 2021-10-17 DIAGNOSIS — G311 Senile degeneration of brain, not elsewhere classified: Secondary | ICD-10-CM | POA: Diagnosis not present

## 2021-10-17 DIAGNOSIS — E43 Unspecified severe protein-calorie malnutrition: Secondary | ICD-10-CM | POA: Diagnosis not present

## 2021-10-17 DIAGNOSIS — R296 Repeated falls: Secondary | ICD-10-CM | POA: Diagnosis not present

## 2021-10-17 DIAGNOSIS — F0284 Dementia in other diseases classified elsewhere, unspecified severity, with anxiety: Secondary | ICD-10-CM | POA: Diagnosis not present

## 2021-10-17 DIAGNOSIS — Z6820 Body mass index (BMI) 20.0-20.9, adult: Secondary | ICD-10-CM | POA: Diagnosis not present

## 2021-10-18 DIAGNOSIS — Z6820 Body mass index (BMI) 20.0-20.9, adult: Secondary | ICD-10-CM | POA: Diagnosis not present

## 2021-10-18 DIAGNOSIS — F0284 Dementia in other diseases classified elsewhere, unspecified severity, with anxiety: Secondary | ICD-10-CM | POA: Diagnosis not present

## 2021-10-18 DIAGNOSIS — E43 Unspecified severe protein-calorie malnutrition: Secondary | ICD-10-CM | POA: Diagnosis not present

## 2021-10-18 DIAGNOSIS — F0283 Dementia in other diseases classified elsewhere, unspecified severity, with mood disturbance: Secondary | ICD-10-CM | POA: Diagnosis not present

## 2021-10-18 DIAGNOSIS — G311 Senile degeneration of brain, not elsewhere classified: Secondary | ICD-10-CM | POA: Diagnosis not present

## 2021-10-18 DIAGNOSIS — R296 Repeated falls: Secondary | ICD-10-CM | POA: Diagnosis not present

## 2021-10-21 DIAGNOSIS — E43 Unspecified severe protein-calorie malnutrition: Secondary | ICD-10-CM | POA: Diagnosis not present

## 2021-10-21 DIAGNOSIS — G311 Senile degeneration of brain, not elsewhere classified: Secondary | ICD-10-CM | POA: Diagnosis not present

## 2021-10-21 DIAGNOSIS — Z6820 Body mass index (BMI) 20.0-20.9, adult: Secondary | ICD-10-CM | POA: Diagnosis not present

## 2021-10-21 DIAGNOSIS — F0283 Dementia in other diseases classified elsewhere, unspecified severity, with mood disturbance: Secondary | ICD-10-CM | POA: Diagnosis not present

## 2021-10-21 DIAGNOSIS — R296 Repeated falls: Secondary | ICD-10-CM | POA: Diagnosis not present

## 2021-10-21 DIAGNOSIS — F0284 Dementia in other diseases classified elsewhere, unspecified severity, with anxiety: Secondary | ICD-10-CM | POA: Diagnosis not present

## 2021-10-23 DIAGNOSIS — G40909 Epilepsy, unspecified, not intractable, without status epilepticus: Secondary | ICD-10-CM | POA: Diagnosis not present

## 2021-10-23 DIAGNOSIS — E43 Unspecified severe protein-calorie malnutrition: Secondary | ICD-10-CM | POA: Diagnosis not present

## 2021-10-23 DIAGNOSIS — G9340 Encephalopathy, unspecified: Secondary | ICD-10-CM | POA: Diagnosis not present

## 2021-10-23 DIAGNOSIS — D649 Anemia, unspecified: Secondary | ICD-10-CM | POA: Diagnosis not present

## 2021-10-23 DIAGNOSIS — Z6820 Body mass index (BMI) 20.0-20.9, adult: Secondary | ICD-10-CM | POA: Diagnosis not present

## 2021-10-23 DIAGNOSIS — R296 Repeated falls: Secondary | ICD-10-CM | POA: Diagnosis not present

## 2021-10-23 DIAGNOSIS — F0284 Dementia in other diseases classified elsewhere, unspecified severity, with anxiety: Secondary | ICD-10-CM | POA: Diagnosis not present

## 2021-10-23 DIAGNOSIS — F0390 Unspecified dementia without behavioral disturbance: Secondary | ICD-10-CM | POA: Diagnosis not present

## 2021-10-23 DIAGNOSIS — G311 Senile degeneration of brain, not elsewhere classified: Secondary | ICD-10-CM | POA: Diagnosis not present

## 2021-10-23 DIAGNOSIS — F0283 Dementia in other diseases classified elsewhere, unspecified severity, with mood disturbance: Secondary | ICD-10-CM | POA: Diagnosis not present

## 2021-10-25 DIAGNOSIS — R21 Rash and other nonspecific skin eruption: Secondary | ICD-10-CM | POA: Diagnosis not present

## 2021-10-25 DIAGNOSIS — I480 Paroxysmal atrial fibrillation: Secondary | ICD-10-CM | POA: Diagnosis not present

## 2021-10-25 DIAGNOSIS — F0390 Unspecified dementia without behavioral disturbance: Secondary | ICD-10-CM | POA: Diagnosis not present

## 2021-10-25 DIAGNOSIS — F329 Major depressive disorder, single episode, unspecified: Secondary | ICD-10-CM | POA: Diagnosis not present

## 2021-10-25 DIAGNOSIS — K219 Gastro-esophageal reflux disease without esophagitis: Secondary | ICD-10-CM | POA: Diagnosis not present

## 2021-10-28 DIAGNOSIS — R296 Repeated falls: Secondary | ICD-10-CM | POA: Diagnosis not present

## 2021-10-28 DIAGNOSIS — F0283 Dementia in other diseases classified elsewhere, unspecified severity, with mood disturbance: Secondary | ICD-10-CM | POA: Diagnosis not present

## 2021-10-28 DIAGNOSIS — F0284 Dementia in other diseases classified elsewhere, unspecified severity, with anxiety: Secondary | ICD-10-CM | POA: Diagnosis not present

## 2021-10-28 DIAGNOSIS — Z6820 Body mass index (BMI) 20.0-20.9, adult: Secondary | ICD-10-CM | POA: Diagnosis not present

## 2021-10-28 DIAGNOSIS — E43 Unspecified severe protein-calorie malnutrition: Secondary | ICD-10-CM | POA: Diagnosis not present

## 2021-10-28 DIAGNOSIS — G311 Senile degeneration of brain, not elsewhere classified: Secondary | ICD-10-CM | POA: Diagnosis not present

## 2021-10-30 DIAGNOSIS — Z6820 Body mass index (BMI) 20.0-20.9, adult: Secondary | ICD-10-CM | POA: Diagnosis not present

## 2021-10-30 DIAGNOSIS — G311 Senile degeneration of brain, not elsewhere classified: Secondary | ICD-10-CM | POA: Diagnosis not present

## 2021-10-30 DIAGNOSIS — E43 Unspecified severe protein-calorie malnutrition: Secondary | ICD-10-CM | POA: Diagnosis not present

## 2021-10-30 DIAGNOSIS — F0283 Dementia in other diseases classified elsewhere, unspecified severity, with mood disturbance: Secondary | ICD-10-CM | POA: Diagnosis not present

## 2021-10-30 DIAGNOSIS — F0284 Dementia in other diseases classified elsewhere, unspecified severity, with anxiety: Secondary | ICD-10-CM | POA: Diagnosis not present

## 2021-10-30 DIAGNOSIS — R296 Repeated falls: Secondary | ICD-10-CM | POA: Diagnosis not present

## 2021-11-04 DIAGNOSIS — R296 Repeated falls: Secondary | ICD-10-CM | POA: Diagnosis not present

## 2021-11-04 DIAGNOSIS — G311 Senile degeneration of brain, not elsewhere classified: Secondary | ICD-10-CM | POA: Diagnosis not present

## 2021-11-04 DIAGNOSIS — F0284 Dementia in other diseases classified elsewhere, unspecified severity, with anxiety: Secondary | ICD-10-CM | POA: Diagnosis not present

## 2021-11-04 DIAGNOSIS — F0283 Dementia in other diseases classified elsewhere, unspecified severity, with mood disturbance: Secondary | ICD-10-CM | POA: Diagnosis not present

## 2021-11-04 DIAGNOSIS — E43 Unspecified severe protein-calorie malnutrition: Secondary | ICD-10-CM | POA: Diagnosis not present

## 2021-11-04 DIAGNOSIS — Z6820 Body mass index (BMI) 20.0-20.9, adult: Secondary | ICD-10-CM | POA: Diagnosis not present

## 2021-11-06 DIAGNOSIS — F0284 Dementia in other diseases classified elsewhere, unspecified severity, with anxiety: Secondary | ICD-10-CM | POA: Diagnosis not present

## 2021-11-06 DIAGNOSIS — E43 Unspecified severe protein-calorie malnutrition: Secondary | ICD-10-CM | POA: Diagnosis not present

## 2021-11-06 DIAGNOSIS — Z6820 Body mass index (BMI) 20.0-20.9, adult: Secondary | ICD-10-CM | POA: Diagnosis not present

## 2021-11-06 DIAGNOSIS — R296 Repeated falls: Secondary | ICD-10-CM | POA: Diagnosis not present

## 2021-11-06 DIAGNOSIS — G311 Senile degeneration of brain, not elsewhere classified: Secondary | ICD-10-CM | POA: Diagnosis not present

## 2021-11-06 DIAGNOSIS — F0283 Dementia in other diseases classified elsewhere, unspecified severity, with mood disturbance: Secondary | ICD-10-CM | POA: Diagnosis not present

## 2021-11-08 DIAGNOSIS — F0283 Dementia in other diseases classified elsewhere, unspecified severity, with mood disturbance: Secondary | ICD-10-CM | POA: Diagnosis not present

## 2021-11-08 DIAGNOSIS — Z6820 Body mass index (BMI) 20.0-20.9, adult: Secondary | ICD-10-CM | POA: Diagnosis not present

## 2021-11-08 DIAGNOSIS — F0284 Dementia in other diseases classified elsewhere, unspecified severity, with anxiety: Secondary | ICD-10-CM | POA: Diagnosis not present

## 2021-11-08 DIAGNOSIS — R296 Repeated falls: Secondary | ICD-10-CM | POA: Diagnosis not present

## 2021-11-08 DIAGNOSIS — G311 Senile degeneration of brain, not elsewhere classified: Secondary | ICD-10-CM | POA: Diagnosis not present

## 2021-11-08 DIAGNOSIS — E43 Unspecified severe protein-calorie malnutrition: Secondary | ICD-10-CM | POA: Diagnosis not present

## 2021-11-09 DIAGNOSIS — I480 Paroxysmal atrial fibrillation: Secondary | ICD-10-CM | POA: Diagnosis not present

## 2021-11-09 DIAGNOSIS — F0390 Unspecified dementia without behavioral disturbance: Secondary | ICD-10-CM | POA: Diagnosis not present

## 2021-11-09 DIAGNOSIS — G4089 Other seizures: Secondary | ICD-10-CM | POA: Diagnosis not present

## 2021-11-09 DIAGNOSIS — R21 Rash and other nonspecific skin eruption: Secondary | ICD-10-CM | POA: Diagnosis not present

## 2021-11-09 DIAGNOSIS — I10 Essential (primary) hypertension: Secondary | ICD-10-CM | POA: Diagnosis not present

## 2021-11-09 DIAGNOSIS — F329 Major depressive disorder, single episode, unspecified: Secondary | ICD-10-CM | POA: Diagnosis not present

## 2021-11-10 DIAGNOSIS — E538 Deficiency of other specified B group vitamins: Secondary | ICD-10-CM | POA: Diagnosis not present

## 2021-11-10 DIAGNOSIS — H409 Unspecified glaucoma: Secondary | ICD-10-CM | POA: Diagnosis not present

## 2021-11-10 DIAGNOSIS — Z8719 Personal history of other diseases of the digestive system: Secondary | ICD-10-CM | POA: Diagnosis not present

## 2021-11-10 DIAGNOSIS — Z6820 Body mass index (BMI) 20.0-20.9, adult: Secondary | ICD-10-CM | POA: Diagnosis not present

## 2021-11-10 DIAGNOSIS — I129 Hypertensive chronic kidney disease with stage 1 through stage 4 chronic kidney disease, or unspecified chronic kidney disease: Secondary | ICD-10-CM | POA: Diagnosis not present

## 2021-11-10 DIAGNOSIS — G311 Senile degeneration of brain, not elsewhere classified: Secondary | ICD-10-CM | POA: Diagnosis not present

## 2021-11-10 DIAGNOSIS — G40909 Epilepsy, unspecified, not intractable, without status epilepticus: Secondary | ICD-10-CM | POA: Diagnosis not present

## 2021-11-10 DIAGNOSIS — M810 Age-related osteoporosis without current pathological fracture: Secondary | ICD-10-CM | POA: Diagnosis not present

## 2021-11-10 DIAGNOSIS — N1831 Chronic kidney disease, stage 3a: Secondary | ICD-10-CM | POA: Diagnosis not present

## 2021-11-10 DIAGNOSIS — R296 Repeated falls: Secondary | ICD-10-CM | POA: Diagnosis not present

## 2021-11-10 DIAGNOSIS — E43 Unspecified severe protein-calorie malnutrition: Secondary | ICD-10-CM | POA: Diagnosis not present

## 2021-11-10 DIAGNOSIS — F0283 Dementia in other diseases classified elsewhere, unspecified severity, with mood disturbance: Secondary | ICD-10-CM | POA: Diagnosis not present

## 2021-11-10 DIAGNOSIS — I4891 Unspecified atrial fibrillation: Secondary | ICD-10-CM | POA: Diagnosis not present

## 2021-11-10 DIAGNOSIS — K219 Gastro-esophageal reflux disease without esophagitis: Secondary | ICD-10-CM | POA: Diagnosis not present

## 2021-11-10 DIAGNOSIS — F0284 Dementia in other diseases classified elsewhere, unspecified severity, with anxiety: Secondary | ICD-10-CM | POA: Diagnosis not present

## 2021-11-10 DIAGNOSIS — E785 Hyperlipidemia, unspecified: Secondary | ICD-10-CM | POA: Diagnosis not present

## 2021-11-10 DIAGNOSIS — D631 Anemia in chronic kidney disease: Secondary | ICD-10-CM | POA: Diagnosis not present

## 2021-11-10 DIAGNOSIS — L299 Pruritus, unspecified: Secondary | ICD-10-CM | POA: Diagnosis not present

## 2021-11-10 DIAGNOSIS — Z87440 Personal history of urinary (tract) infections: Secondary | ICD-10-CM | POA: Diagnosis not present

## 2021-11-11 DIAGNOSIS — Z6820 Body mass index (BMI) 20.0-20.9, adult: Secondary | ICD-10-CM | POA: Diagnosis not present

## 2021-11-11 DIAGNOSIS — F0283 Dementia in other diseases classified elsewhere, unspecified severity, with mood disturbance: Secondary | ICD-10-CM | POA: Diagnosis not present

## 2021-11-11 DIAGNOSIS — R296 Repeated falls: Secondary | ICD-10-CM | POA: Diagnosis not present

## 2021-11-11 DIAGNOSIS — G311 Senile degeneration of brain, not elsewhere classified: Secondary | ICD-10-CM | POA: Diagnosis not present

## 2021-11-11 DIAGNOSIS — F0284 Dementia in other diseases classified elsewhere, unspecified severity, with anxiety: Secondary | ICD-10-CM | POA: Diagnosis not present

## 2021-11-11 DIAGNOSIS — E43 Unspecified severe protein-calorie malnutrition: Secondary | ICD-10-CM | POA: Diagnosis not present

## 2021-11-12 DIAGNOSIS — G311 Senile degeneration of brain, not elsewhere classified: Secondary | ICD-10-CM | POA: Diagnosis not present

## 2021-11-12 DIAGNOSIS — R296 Repeated falls: Secondary | ICD-10-CM | POA: Diagnosis not present

## 2021-11-12 DIAGNOSIS — F0283 Dementia in other diseases classified elsewhere, unspecified severity, with mood disturbance: Secondary | ICD-10-CM | POA: Diagnosis not present

## 2021-11-12 DIAGNOSIS — Z6820 Body mass index (BMI) 20.0-20.9, adult: Secondary | ICD-10-CM | POA: Diagnosis not present

## 2021-11-12 DIAGNOSIS — F0284 Dementia in other diseases classified elsewhere, unspecified severity, with anxiety: Secondary | ICD-10-CM | POA: Diagnosis not present

## 2021-11-12 DIAGNOSIS — E43 Unspecified severe protein-calorie malnutrition: Secondary | ICD-10-CM | POA: Diagnosis not present

## 2021-11-14 DIAGNOSIS — R296 Repeated falls: Secondary | ICD-10-CM | POA: Diagnosis not present

## 2021-11-14 DIAGNOSIS — E43 Unspecified severe protein-calorie malnutrition: Secondary | ICD-10-CM | POA: Diagnosis not present

## 2021-11-14 DIAGNOSIS — F0284 Dementia in other diseases classified elsewhere, unspecified severity, with anxiety: Secondary | ICD-10-CM | POA: Diagnosis not present

## 2021-11-14 DIAGNOSIS — Z6820 Body mass index (BMI) 20.0-20.9, adult: Secondary | ICD-10-CM | POA: Diagnosis not present

## 2021-11-14 DIAGNOSIS — G311 Senile degeneration of brain, not elsewhere classified: Secondary | ICD-10-CM | POA: Diagnosis not present

## 2021-11-14 DIAGNOSIS — F0283 Dementia in other diseases classified elsewhere, unspecified severity, with mood disturbance: Secondary | ICD-10-CM | POA: Diagnosis not present

## 2021-11-15 DIAGNOSIS — I480 Paroxysmal atrial fibrillation: Secondary | ICD-10-CM | POA: Diagnosis not present

## 2021-11-15 DIAGNOSIS — K219 Gastro-esophageal reflux disease without esophagitis: Secondary | ICD-10-CM | POA: Diagnosis not present

## 2021-11-15 DIAGNOSIS — F0390 Unspecified dementia without behavioral disturbance: Secondary | ICD-10-CM | POA: Diagnosis not present

## 2021-11-15 DIAGNOSIS — F329 Major depressive disorder, single episode, unspecified: Secondary | ICD-10-CM | POA: Diagnosis not present

## 2021-11-21 DIAGNOSIS — G311 Senile degeneration of brain, not elsewhere classified: Secondary | ICD-10-CM | POA: Diagnosis not present

## 2021-11-21 DIAGNOSIS — F0283 Dementia in other diseases classified elsewhere, unspecified severity, with mood disturbance: Secondary | ICD-10-CM | POA: Diagnosis not present

## 2021-11-21 DIAGNOSIS — Z6820 Body mass index (BMI) 20.0-20.9, adult: Secondary | ICD-10-CM | POA: Diagnosis not present

## 2021-11-21 DIAGNOSIS — R296 Repeated falls: Secondary | ICD-10-CM | POA: Diagnosis not present

## 2021-11-21 DIAGNOSIS — E43 Unspecified severe protein-calorie malnutrition: Secondary | ICD-10-CM | POA: Diagnosis not present

## 2021-11-21 DIAGNOSIS — F0284 Dementia in other diseases classified elsewhere, unspecified severity, with anxiety: Secondary | ICD-10-CM | POA: Diagnosis not present

## 2021-11-22 DIAGNOSIS — F0283 Dementia in other diseases classified elsewhere, unspecified severity, with mood disturbance: Secondary | ICD-10-CM | POA: Diagnosis not present

## 2021-11-22 DIAGNOSIS — E43 Unspecified severe protein-calorie malnutrition: Secondary | ICD-10-CM | POA: Diagnosis not present

## 2021-11-22 DIAGNOSIS — R296 Repeated falls: Secondary | ICD-10-CM | POA: Diagnosis not present

## 2021-11-22 DIAGNOSIS — G311 Senile degeneration of brain, not elsewhere classified: Secondary | ICD-10-CM | POA: Diagnosis not present

## 2021-11-22 DIAGNOSIS — F0284 Dementia in other diseases classified elsewhere, unspecified severity, with anxiety: Secondary | ICD-10-CM | POA: Diagnosis not present

## 2021-11-22 DIAGNOSIS — Z6820 Body mass index (BMI) 20.0-20.9, adult: Secondary | ICD-10-CM | POA: Diagnosis not present

## 2021-11-25 DIAGNOSIS — R296 Repeated falls: Secondary | ICD-10-CM | POA: Diagnosis not present

## 2021-11-25 DIAGNOSIS — F0284 Dementia in other diseases classified elsewhere, unspecified severity, with anxiety: Secondary | ICD-10-CM | POA: Diagnosis not present

## 2021-11-25 DIAGNOSIS — G311 Senile degeneration of brain, not elsewhere classified: Secondary | ICD-10-CM | POA: Diagnosis not present

## 2021-11-25 DIAGNOSIS — E43 Unspecified severe protein-calorie malnutrition: Secondary | ICD-10-CM | POA: Diagnosis not present

## 2021-11-25 DIAGNOSIS — Z6820 Body mass index (BMI) 20.0-20.9, adult: Secondary | ICD-10-CM | POA: Diagnosis not present

## 2021-11-25 DIAGNOSIS — F0283 Dementia in other diseases classified elsewhere, unspecified severity, with mood disturbance: Secondary | ICD-10-CM | POA: Diagnosis not present

## 2021-11-26 DIAGNOSIS — Z6820 Body mass index (BMI) 20.0-20.9, adult: Secondary | ICD-10-CM | POA: Diagnosis not present

## 2021-11-26 DIAGNOSIS — G311 Senile degeneration of brain, not elsewhere classified: Secondary | ICD-10-CM | POA: Diagnosis not present

## 2021-11-26 DIAGNOSIS — R296 Repeated falls: Secondary | ICD-10-CM | POA: Diagnosis not present

## 2021-11-26 DIAGNOSIS — F0283 Dementia in other diseases classified elsewhere, unspecified severity, with mood disturbance: Secondary | ICD-10-CM | POA: Diagnosis not present

## 2021-11-26 DIAGNOSIS — F0284 Dementia in other diseases classified elsewhere, unspecified severity, with anxiety: Secondary | ICD-10-CM | POA: Diagnosis not present

## 2021-11-26 DIAGNOSIS — E43 Unspecified severe protein-calorie malnutrition: Secondary | ICD-10-CM | POA: Diagnosis not present

## 2021-11-27 DIAGNOSIS — L309 Dermatitis, unspecified: Secondary | ICD-10-CM | POA: Diagnosis not present

## 2021-11-27 DIAGNOSIS — L011 Impetiginization of other dermatoses: Secondary | ICD-10-CM | POA: Diagnosis not present

## 2021-11-28 DIAGNOSIS — G311 Senile degeneration of brain, not elsewhere classified: Secondary | ICD-10-CM | POA: Diagnosis not present

## 2021-11-28 DIAGNOSIS — E43 Unspecified severe protein-calorie malnutrition: Secondary | ICD-10-CM | POA: Diagnosis not present

## 2021-11-28 DIAGNOSIS — F0283 Dementia in other diseases classified elsewhere, unspecified severity, with mood disturbance: Secondary | ICD-10-CM | POA: Diagnosis not present

## 2021-11-28 DIAGNOSIS — F0284 Dementia in other diseases classified elsewhere, unspecified severity, with anxiety: Secondary | ICD-10-CM | POA: Diagnosis not present

## 2021-11-28 DIAGNOSIS — R296 Repeated falls: Secondary | ICD-10-CM | POA: Diagnosis not present

## 2021-11-28 DIAGNOSIS — Z6820 Body mass index (BMI) 20.0-20.9, adult: Secondary | ICD-10-CM | POA: Diagnosis not present

## 2021-12-02 DIAGNOSIS — R296 Repeated falls: Secondary | ICD-10-CM | POA: Diagnosis not present

## 2021-12-02 DIAGNOSIS — F0284 Dementia in other diseases classified elsewhere, unspecified severity, with anxiety: Secondary | ICD-10-CM | POA: Diagnosis not present

## 2021-12-02 DIAGNOSIS — Z6820 Body mass index (BMI) 20.0-20.9, adult: Secondary | ICD-10-CM | POA: Diagnosis not present

## 2021-12-02 DIAGNOSIS — F0283 Dementia in other diseases classified elsewhere, unspecified severity, with mood disturbance: Secondary | ICD-10-CM | POA: Diagnosis not present

## 2021-12-02 DIAGNOSIS — G311 Senile degeneration of brain, not elsewhere classified: Secondary | ICD-10-CM | POA: Diagnosis not present

## 2021-12-02 DIAGNOSIS — E43 Unspecified severe protein-calorie malnutrition: Secondary | ICD-10-CM | POA: Diagnosis not present

## 2021-12-04 DIAGNOSIS — R296 Repeated falls: Secondary | ICD-10-CM | POA: Diagnosis not present

## 2021-12-04 DIAGNOSIS — E43 Unspecified severe protein-calorie malnutrition: Secondary | ICD-10-CM | POA: Diagnosis not present

## 2021-12-04 DIAGNOSIS — F0284 Dementia in other diseases classified elsewhere, unspecified severity, with anxiety: Secondary | ICD-10-CM | POA: Diagnosis not present

## 2021-12-04 DIAGNOSIS — G311 Senile degeneration of brain, not elsewhere classified: Secondary | ICD-10-CM | POA: Diagnosis not present

## 2021-12-04 DIAGNOSIS — F0283 Dementia in other diseases classified elsewhere, unspecified severity, with mood disturbance: Secondary | ICD-10-CM | POA: Diagnosis not present

## 2021-12-04 DIAGNOSIS — Z6820 Body mass index (BMI) 20.0-20.9, adult: Secondary | ICD-10-CM | POA: Diagnosis not present

## 2021-12-05 DIAGNOSIS — Z6820 Body mass index (BMI) 20.0-20.9, adult: Secondary | ICD-10-CM | POA: Diagnosis not present

## 2021-12-05 DIAGNOSIS — I480 Paroxysmal atrial fibrillation: Secondary | ICD-10-CM | POA: Diagnosis not present

## 2021-12-05 DIAGNOSIS — R21 Rash and other nonspecific skin eruption: Secondary | ICD-10-CM | POA: Diagnosis not present

## 2021-12-05 DIAGNOSIS — F0283 Dementia in other diseases classified elsewhere, unspecified severity, with mood disturbance: Secondary | ICD-10-CM | POA: Diagnosis not present

## 2021-12-05 DIAGNOSIS — F0390 Unspecified dementia without behavioral disturbance: Secondary | ICD-10-CM | POA: Diagnosis not present

## 2021-12-05 DIAGNOSIS — E43 Unspecified severe protein-calorie malnutrition: Secondary | ICD-10-CM | POA: Diagnosis not present

## 2021-12-05 DIAGNOSIS — R296 Repeated falls: Secondary | ICD-10-CM | POA: Diagnosis not present

## 2021-12-05 DIAGNOSIS — I10 Essential (primary) hypertension: Secondary | ICD-10-CM | POA: Diagnosis not present

## 2021-12-05 DIAGNOSIS — G311 Senile degeneration of brain, not elsewhere classified: Secondary | ICD-10-CM | POA: Diagnosis not present

## 2021-12-05 DIAGNOSIS — F0284 Dementia in other diseases classified elsewhere, unspecified severity, with anxiety: Secondary | ICD-10-CM | POA: Diagnosis not present

## 2021-12-10 DIAGNOSIS — G311 Senile degeneration of brain, not elsewhere classified: Secondary | ICD-10-CM | POA: Diagnosis not present

## 2021-12-10 DIAGNOSIS — Z6820 Body mass index (BMI) 20.0-20.9, adult: Secondary | ICD-10-CM | POA: Diagnosis not present

## 2021-12-10 DIAGNOSIS — R296 Repeated falls: Secondary | ICD-10-CM | POA: Diagnosis not present

## 2021-12-10 DIAGNOSIS — E43 Unspecified severe protein-calorie malnutrition: Secondary | ICD-10-CM | POA: Diagnosis not present

## 2021-12-10 DIAGNOSIS — F0284 Dementia in other diseases classified elsewhere, unspecified severity, with anxiety: Secondary | ICD-10-CM | POA: Diagnosis not present

## 2021-12-10 DIAGNOSIS — F0283 Dementia in other diseases classified elsewhere, unspecified severity, with mood disturbance: Secondary | ICD-10-CM | POA: Diagnosis not present

## 2021-12-11 DIAGNOSIS — Z87440 Personal history of urinary (tract) infections: Secondary | ICD-10-CM | POA: Diagnosis not present

## 2021-12-11 DIAGNOSIS — G40909 Epilepsy, unspecified, not intractable, without status epilepticus: Secondary | ICD-10-CM | POA: Diagnosis not present

## 2021-12-11 DIAGNOSIS — F0284 Dementia in other diseases classified elsewhere, unspecified severity, with anxiety: Secondary | ICD-10-CM | POA: Diagnosis not present

## 2021-12-11 DIAGNOSIS — Z6820 Body mass index (BMI) 20.0-20.9, adult: Secondary | ICD-10-CM | POA: Diagnosis not present

## 2021-12-11 DIAGNOSIS — E43 Unspecified severe protein-calorie malnutrition: Secondary | ICD-10-CM | POA: Diagnosis not present

## 2021-12-11 DIAGNOSIS — E785 Hyperlipidemia, unspecified: Secondary | ICD-10-CM | POA: Diagnosis not present

## 2021-12-11 DIAGNOSIS — H409 Unspecified glaucoma: Secondary | ICD-10-CM | POA: Diagnosis not present

## 2021-12-11 DIAGNOSIS — F0283 Dementia in other diseases classified elsewhere, unspecified severity, with mood disturbance: Secondary | ICD-10-CM | POA: Diagnosis not present

## 2021-12-11 DIAGNOSIS — G311 Senile degeneration of brain, not elsewhere classified: Secondary | ICD-10-CM | POA: Diagnosis not present

## 2021-12-11 DIAGNOSIS — I4891 Unspecified atrial fibrillation: Secondary | ICD-10-CM | POA: Diagnosis not present

## 2021-12-11 DIAGNOSIS — I129 Hypertensive chronic kidney disease with stage 1 through stage 4 chronic kidney disease, or unspecified chronic kidney disease: Secondary | ICD-10-CM | POA: Diagnosis not present

## 2021-12-11 DIAGNOSIS — M810 Age-related osteoporosis without current pathological fracture: Secondary | ICD-10-CM | POA: Diagnosis not present

## 2021-12-11 DIAGNOSIS — Z8719 Personal history of other diseases of the digestive system: Secondary | ICD-10-CM | POA: Diagnosis not present

## 2021-12-11 DIAGNOSIS — R296 Repeated falls: Secondary | ICD-10-CM | POA: Diagnosis not present

## 2021-12-11 DIAGNOSIS — E538 Deficiency of other specified B group vitamins: Secondary | ICD-10-CM | POA: Diagnosis not present

## 2021-12-11 DIAGNOSIS — L299 Pruritus, unspecified: Secondary | ICD-10-CM | POA: Diagnosis not present

## 2021-12-11 DIAGNOSIS — K219 Gastro-esophageal reflux disease without esophagitis: Secondary | ICD-10-CM | POA: Diagnosis not present

## 2021-12-11 DIAGNOSIS — D631 Anemia in chronic kidney disease: Secondary | ICD-10-CM | POA: Diagnosis not present

## 2021-12-11 DIAGNOSIS — N1831 Chronic kidney disease, stage 3a: Secondary | ICD-10-CM | POA: Diagnosis not present

## 2021-12-12 DIAGNOSIS — F0284 Dementia in other diseases classified elsewhere, unspecified severity, with anxiety: Secondary | ICD-10-CM | POA: Diagnosis not present

## 2021-12-12 DIAGNOSIS — Z6820 Body mass index (BMI) 20.0-20.9, adult: Secondary | ICD-10-CM | POA: Diagnosis not present

## 2021-12-12 DIAGNOSIS — G311 Senile degeneration of brain, not elsewhere classified: Secondary | ICD-10-CM | POA: Diagnosis not present

## 2021-12-12 DIAGNOSIS — R296 Repeated falls: Secondary | ICD-10-CM | POA: Diagnosis not present

## 2021-12-12 DIAGNOSIS — E43 Unspecified severe protein-calorie malnutrition: Secondary | ICD-10-CM | POA: Diagnosis not present

## 2021-12-12 DIAGNOSIS — F0283 Dementia in other diseases classified elsewhere, unspecified severity, with mood disturbance: Secondary | ICD-10-CM | POA: Diagnosis not present

## 2021-12-15 DIAGNOSIS — F329 Major depressive disorder, single episode, unspecified: Secondary | ICD-10-CM | POA: Diagnosis not present

## 2021-12-15 DIAGNOSIS — G4089 Other seizures: Secondary | ICD-10-CM | POA: Diagnosis not present

## 2021-12-15 DIAGNOSIS — I480 Paroxysmal atrial fibrillation: Secondary | ICD-10-CM | POA: Diagnosis not present

## 2021-12-15 DIAGNOSIS — F0390 Unspecified dementia without behavioral disturbance: Secondary | ICD-10-CM | POA: Diagnosis not present

## 2021-12-15 DIAGNOSIS — R21 Rash and other nonspecific skin eruption: Secondary | ICD-10-CM | POA: Diagnosis not present

## 2021-12-16 DIAGNOSIS — Z20822 Contact with and (suspected) exposure to covid-19: Secondary | ICD-10-CM | POA: Diagnosis not present

## 2021-12-16 DIAGNOSIS — E43 Unspecified severe protein-calorie malnutrition: Secondary | ICD-10-CM | POA: Diagnosis not present

## 2021-12-16 DIAGNOSIS — F0284 Dementia in other diseases classified elsewhere, unspecified severity, with anxiety: Secondary | ICD-10-CM | POA: Diagnosis not present

## 2021-12-16 DIAGNOSIS — G311 Senile degeneration of brain, not elsewhere classified: Secondary | ICD-10-CM | POA: Diagnosis not present

## 2021-12-16 DIAGNOSIS — Z6820 Body mass index (BMI) 20.0-20.9, adult: Secondary | ICD-10-CM | POA: Diagnosis not present

## 2021-12-16 DIAGNOSIS — R296 Repeated falls: Secondary | ICD-10-CM | POA: Diagnosis not present

## 2021-12-16 DIAGNOSIS — F0283 Dementia in other diseases classified elsewhere, unspecified severity, with mood disturbance: Secondary | ICD-10-CM | POA: Diagnosis not present

## 2021-12-18 DIAGNOSIS — R296 Repeated falls: Secondary | ICD-10-CM | POA: Diagnosis not present

## 2021-12-18 DIAGNOSIS — Z1389 Encounter for screening for other disorder: Secondary | ICD-10-CM | POA: Diagnosis not present

## 2021-12-18 DIAGNOSIS — E43 Unspecified severe protein-calorie malnutrition: Secondary | ICD-10-CM | POA: Diagnosis not present

## 2021-12-18 DIAGNOSIS — G311 Senile degeneration of brain, not elsewhere classified: Secondary | ICD-10-CM | POA: Diagnosis not present

## 2021-12-18 DIAGNOSIS — F0283 Dementia in other diseases classified elsewhere, unspecified severity, with mood disturbance: Secondary | ICD-10-CM | POA: Diagnosis not present

## 2021-12-18 DIAGNOSIS — F0284 Dementia in other diseases classified elsewhere, unspecified severity, with anxiety: Secondary | ICD-10-CM | POA: Diagnosis not present

## 2021-12-18 DIAGNOSIS — Z6820 Body mass index (BMI) 20.0-20.9, adult: Secondary | ICD-10-CM | POA: Diagnosis not present

## 2021-12-19 DIAGNOSIS — F0283 Dementia in other diseases classified elsewhere, unspecified severity, with mood disturbance: Secondary | ICD-10-CM | POA: Diagnosis not present

## 2021-12-19 DIAGNOSIS — F0284 Dementia in other diseases classified elsewhere, unspecified severity, with anxiety: Secondary | ICD-10-CM | POA: Diagnosis not present

## 2021-12-19 DIAGNOSIS — Z6820 Body mass index (BMI) 20.0-20.9, adult: Secondary | ICD-10-CM | POA: Diagnosis not present

## 2021-12-19 DIAGNOSIS — E43 Unspecified severe protein-calorie malnutrition: Secondary | ICD-10-CM | POA: Diagnosis not present

## 2021-12-19 DIAGNOSIS — R296 Repeated falls: Secondary | ICD-10-CM | POA: Diagnosis not present

## 2021-12-19 DIAGNOSIS — G311 Senile degeneration of brain, not elsewhere classified: Secondary | ICD-10-CM | POA: Diagnosis not present

## 2021-12-20 DIAGNOSIS — F0284 Dementia in other diseases classified elsewhere, unspecified severity, with anxiety: Secondary | ICD-10-CM | POA: Diagnosis not present

## 2021-12-20 DIAGNOSIS — R296 Repeated falls: Secondary | ICD-10-CM | POA: Diagnosis not present

## 2021-12-20 DIAGNOSIS — F0283 Dementia in other diseases classified elsewhere, unspecified severity, with mood disturbance: Secondary | ICD-10-CM | POA: Diagnosis not present

## 2021-12-20 DIAGNOSIS — E43 Unspecified severe protein-calorie malnutrition: Secondary | ICD-10-CM | POA: Diagnosis not present

## 2021-12-20 DIAGNOSIS — G311 Senile degeneration of brain, not elsewhere classified: Secondary | ICD-10-CM | POA: Diagnosis not present

## 2021-12-20 DIAGNOSIS — Z6820 Body mass index (BMI) 20.0-20.9, adult: Secondary | ICD-10-CM | POA: Diagnosis not present

## 2021-12-21 DIAGNOSIS — I10 Essential (primary) hypertension: Secondary | ICD-10-CM | POA: Diagnosis not present

## 2021-12-21 DIAGNOSIS — I480 Paroxysmal atrial fibrillation: Secondary | ICD-10-CM | POA: Diagnosis not present

## 2021-12-21 DIAGNOSIS — F329 Major depressive disorder, single episode, unspecified: Secondary | ICD-10-CM | POA: Diagnosis not present

## 2021-12-21 DIAGNOSIS — G4089 Other seizures: Secondary | ICD-10-CM | POA: Diagnosis not present

## 2021-12-21 DIAGNOSIS — F0390 Unspecified dementia without behavioral disturbance: Secondary | ICD-10-CM | POA: Diagnosis not present

## 2021-12-23 DIAGNOSIS — G311 Senile degeneration of brain, not elsewhere classified: Secondary | ICD-10-CM | POA: Diagnosis not present

## 2021-12-23 DIAGNOSIS — R296 Repeated falls: Secondary | ICD-10-CM | POA: Diagnosis not present

## 2021-12-23 DIAGNOSIS — E43 Unspecified severe protein-calorie malnutrition: Secondary | ICD-10-CM | POA: Diagnosis not present

## 2021-12-23 DIAGNOSIS — F0284 Dementia in other diseases classified elsewhere, unspecified severity, with anxiety: Secondary | ICD-10-CM | POA: Diagnosis not present

## 2021-12-23 DIAGNOSIS — Z6820 Body mass index (BMI) 20.0-20.9, adult: Secondary | ICD-10-CM | POA: Diagnosis not present

## 2021-12-23 DIAGNOSIS — F0283 Dementia in other diseases classified elsewhere, unspecified severity, with mood disturbance: Secondary | ICD-10-CM | POA: Diagnosis not present

## 2021-12-25 DIAGNOSIS — R296 Repeated falls: Secondary | ICD-10-CM | POA: Diagnosis not present

## 2021-12-25 DIAGNOSIS — F0283 Dementia in other diseases classified elsewhere, unspecified severity, with mood disturbance: Secondary | ICD-10-CM | POA: Diagnosis not present

## 2021-12-25 DIAGNOSIS — G311 Senile degeneration of brain, not elsewhere classified: Secondary | ICD-10-CM | POA: Diagnosis not present

## 2021-12-25 DIAGNOSIS — F0284 Dementia in other diseases classified elsewhere, unspecified severity, with anxiety: Secondary | ICD-10-CM | POA: Diagnosis not present

## 2021-12-25 DIAGNOSIS — Z6820 Body mass index (BMI) 20.0-20.9, adult: Secondary | ICD-10-CM | POA: Diagnosis not present

## 2021-12-25 DIAGNOSIS — E43 Unspecified severe protein-calorie malnutrition: Secondary | ICD-10-CM | POA: Diagnosis not present

## 2021-12-26 DIAGNOSIS — E43 Unspecified severe protein-calorie malnutrition: Secondary | ICD-10-CM | POA: Diagnosis not present

## 2021-12-26 DIAGNOSIS — Z6820 Body mass index (BMI) 20.0-20.9, adult: Secondary | ICD-10-CM | POA: Diagnosis not present

## 2021-12-26 DIAGNOSIS — G311 Senile degeneration of brain, not elsewhere classified: Secondary | ICD-10-CM | POA: Diagnosis not present

## 2021-12-26 DIAGNOSIS — F0284 Dementia in other diseases classified elsewhere, unspecified severity, with anxiety: Secondary | ICD-10-CM | POA: Diagnosis not present

## 2021-12-26 DIAGNOSIS — R296 Repeated falls: Secondary | ICD-10-CM | POA: Diagnosis not present

## 2021-12-26 DIAGNOSIS — F0283 Dementia in other diseases classified elsewhere, unspecified severity, with mood disturbance: Secondary | ICD-10-CM | POA: Diagnosis not present

## 2021-12-30 DIAGNOSIS — G311 Senile degeneration of brain, not elsewhere classified: Secondary | ICD-10-CM | POA: Diagnosis not present

## 2021-12-30 DIAGNOSIS — R296 Repeated falls: Secondary | ICD-10-CM | POA: Diagnosis not present

## 2021-12-30 DIAGNOSIS — E43 Unspecified severe protein-calorie malnutrition: Secondary | ICD-10-CM | POA: Diagnosis not present

## 2021-12-30 DIAGNOSIS — Z6820 Body mass index (BMI) 20.0-20.9, adult: Secondary | ICD-10-CM | POA: Diagnosis not present

## 2021-12-30 DIAGNOSIS — F0284 Dementia in other diseases classified elsewhere, unspecified severity, with anxiety: Secondary | ICD-10-CM | POA: Diagnosis not present

## 2021-12-30 DIAGNOSIS — F0283 Dementia in other diseases classified elsewhere, unspecified severity, with mood disturbance: Secondary | ICD-10-CM | POA: Diagnosis not present

## 2022-01-01 DIAGNOSIS — F0284 Dementia in other diseases classified elsewhere, unspecified severity, with anxiety: Secondary | ICD-10-CM | POA: Diagnosis not present

## 2022-01-01 DIAGNOSIS — G311 Senile degeneration of brain, not elsewhere classified: Secondary | ICD-10-CM | POA: Diagnosis not present

## 2022-01-01 DIAGNOSIS — E43 Unspecified severe protein-calorie malnutrition: Secondary | ICD-10-CM | POA: Diagnosis not present

## 2022-01-01 DIAGNOSIS — R296 Repeated falls: Secondary | ICD-10-CM | POA: Diagnosis not present

## 2022-01-01 DIAGNOSIS — F0283 Dementia in other diseases classified elsewhere, unspecified severity, with mood disturbance: Secondary | ICD-10-CM | POA: Diagnosis not present

## 2022-01-01 DIAGNOSIS — Z6820 Body mass index (BMI) 20.0-20.9, adult: Secondary | ICD-10-CM | POA: Diagnosis not present

## 2022-01-02 DIAGNOSIS — G311 Senile degeneration of brain, not elsewhere classified: Secondary | ICD-10-CM | POA: Diagnosis not present

## 2022-01-02 DIAGNOSIS — Z6820 Body mass index (BMI) 20.0-20.9, adult: Secondary | ICD-10-CM | POA: Diagnosis not present

## 2022-01-02 DIAGNOSIS — F0283 Dementia in other diseases classified elsewhere, unspecified severity, with mood disturbance: Secondary | ICD-10-CM | POA: Diagnosis not present

## 2022-01-02 DIAGNOSIS — E43 Unspecified severe protein-calorie malnutrition: Secondary | ICD-10-CM | POA: Diagnosis not present

## 2022-01-02 DIAGNOSIS — R296 Repeated falls: Secondary | ICD-10-CM | POA: Diagnosis not present

## 2022-01-02 DIAGNOSIS — F0284 Dementia in other diseases classified elsewhere, unspecified severity, with anxiety: Secondary | ICD-10-CM | POA: Diagnosis not present

## 2022-01-06 DIAGNOSIS — F329 Major depressive disorder, single episode, unspecified: Secondary | ICD-10-CM | POA: Diagnosis not present

## 2022-01-06 DIAGNOSIS — I480 Paroxysmal atrial fibrillation: Secondary | ICD-10-CM | POA: Diagnosis not present

## 2022-01-06 DIAGNOSIS — I10 Essential (primary) hypertension: Secondary | ICD-10-CM | POA: Diagnosis not present

## 2022-01-06 DIAGNOSIS — F0390 Unspecified dementia without behavioral disturbance: Secondary | ICD-10-CM | POA: Diagnosis not present

## 2022-01-07 DIAGNOSIS — E43 Unspecified severe protein-calorie malnutrition: Secondary | ICD-10-CM | POA: Diagnosis not present

## 2022-01-07 DIAGNOSIS — R296 Repeated falls: Secondary | ICD-10-CM | POA: Diagnosis not present

## 2022-01-07 DIAGNOSIS — F0284 Dementia in other diseases classified elsewhere, unspecified severity, with anxiety: Secondary | ICD-10-CM | POA: Diagnosis not present

## 2022-01-07 DIAGNOSIS — F0283 Dementia in other diseases classified elsewhere, unspecified severity, with mood disturbance: Secondary | ICD-10-CM | POA: Diagnosis not present

## 2022-01-07 DIAGNOSIS — Z6820 Body mass index (BMI) 20.0-20.9, adult: Secondary | ICD-10-CM | POA: Diagnosis not present

## 2022-01-07 DIAGNOSIS — G311 Senile degeneration of brain, not elsewhere classified: Secondary | ICD-10-CM | POA: Diagnosis not present

## 2022-01-08 DIAGNOSIS — L299 Pruritus, unspecified: Secondary | ICD-10-CM | POA: Diagnosis not present

## 2022-01-08 DIAGNOSIS — K219 Gastro-esophageal reflux disease without esophagitis: Secondary | ICD-10-CM | POA: Diagnosis not present

## 2022-01-08 DIAGNOSIS — Z87440 Personal history of urinary (tract) infections: Secondary | ICD-10-CM | POA: Diagnosis not present

## 2022-01-08 DIAGNOSIS — G311 Senile degeneration of brain, not elsewhere classified: Secondary | ICD-10-CM | POA: Diagnosis not present

## 2022-01-08 DIAGNOSIS — E785 Hyperlipidemia, unspecified: Secondary | ICD-10-CM | POA: Diagnosis not present

## 2022-01-08 DIAGNOSIS — E538 Deficiency of other specified B group vitamins: Secondary | ICD-10-CM | POA: Diagnosis not present

## 2022-01-08 DIAGNOSIS — I129 Hypertensive chronic kidney disease with stage 1 through stage 4 chronic kidney disease, or unspecified chronic kidney disease: Secondary | ICD-10-CM | POA: Diagnosis not present

## 2022-01-08 DIAGNOSIS — R296 Repeated falls: Secondary | ICD-10-CM | POA: Diagnosis not present

## 2022-01-08 DIAGNOSIS — I4891 Unspecified atrial fibrillation: Secondary | ICD-10-CM | POA: Diagnosis not present

## 2022-01-08 DIAGNOSIS — D631 Anemia in chronic kidney disease: Secondary | ICD-10-CM | POA: Diagnosis not present

## 2022-01-08 DIAGNOSIS — E43 Unspecified severe protein-calorie malnutrition: Secondary | ICD-10-CM | POA: Diagnosis not present

## 2022-01-08 DIAGNOSIS — G40909 Epilepsy, unspecified, not intractable, without status epilepticus: Secondary | ICD-10-CM | POA: Diagnosis not present

## 2022-01-08 DIAGNOSIS — M810 Age-related osteoporosis without current pathological fracture: Secondary | ICD-10-CM | POA: Diagnosis not present

## 2022-01-08 DIAGNOSIS — F0284 Dementia in other diseases classified elsewhere, unspecified severity, with anxiety: Secondary | ICD-10-CM | POA: Diagnosis not present

## 2022-01-08 DIAGNOSIS — Z8719 Personal history of other diseases of the digestive system: Secondary | ICD-10-CM | POA: Diagnosis not present

## 2022-01-08 DIAGNOSIS — F0283 Dementia in other diseases classified elsewhere, unspecified severity, with mood disturbance: Secondary | ICD-10-CM | POA: Diagnosis not present

## 2022-01-08 DIAGNOSIS — H409 Unspecified glaucoma: Secondary | ICD-10-CM | POA: Diagnosis not present

## 2022-01-08 DIAGNOSIS — N1831 Chronic kidney disease, stage 3a: Secondary | ICD-10-CM | POA: Diagnosis not present

## 2022-01-08 DIAGNOSIS — Z6820 Body mass index (BMI) 20.0-20.9, adult: Secondary | ICD-10-CM | POA: Diagnosis not present

## 2022-01-09 DIAGNOSIS — F0284 Dementia in other diseases classified elsewhere, unspecified severity, with anxiety: Secondary | ICD-10-CM | POA: Diagnosis not present

## 2022-01-09 DIAGNOSIS — R296 Repeated falls: Secondary | ICD-10-CM | POA: Diagnosis not present

## 2022-01-09 DIAGNOSIS — Z6820 Body mass index (BMI) 20.0-20.9, adult: Secondary | ICD-10-CM | POA: Diagnosis not present

## 2022-01-09 DIAGNOSIS — E43 Unspecified severe protein-calorie malnutrition: Secondary | ICD-10-CM | POA: Diagnosis not present

## 2022-01-09 DIAGNOSIS — G311 Senile degeneration of brain, not elsewhere classified: Secondary | ICD-10-CM | POA: Diagnosis not present

## 2022-01-09 DIAGNOSIS — F0283 Dementia in other diseases classified elsewhere, unspecified severity, with mood disturbance: Secondary | ICD-10-CM | POA: Diagnosis not present

## 2022-01-11 DIAGNOSIS — I10 Essential (primary) hypertension: Secondary | ICD-10-CM | POA: Diagnosis not present

## 2022-01-11 DIAGNOSIS — I480 Paroxysmal atrial fibrillation: Secondary | ICD-10-CM | POA: Diagnosis not present

## 2022-01-11 DIAGNOSIS — G4089 Other seizures: Secondary | ICD-10-CM | POA: Diagnosis not present

## 2022-01-11 DIAGNOSIS — F329 Major depressive disorder, single episode, unspecified: Secondary | ICD-10-CM | POA: Diagnosis not present

## 2022-01-11 DIAGNOSIS — F0390 Unspecified dementia without behavioral disturbance: Secondary | ICD-10-CM | POA: Diagnosis not present

## 2022-01-13 DIAGNOSIS — E43 Unspecified severe protein-calorie malnutrition: Secondary | ICD-10-CM | POA: Diagnosis not present

## 2022-01-13 DIAGNOSIS — Z6820 Body mass index (BMI) 20.0-20.9, adult: Secondary | ICD-10-CM | POA: Diagnosis not present

## 2022-01-13 DIAGNOSIS — F0283 Dementia in other diseases classified elsewhere, unspecified severity, with mood disturbance: Secondary | ICD-10-CM | POA: Diagnosis not present

## 2022-01-13 DIAGNOSIS — G311 Senile degeneration of brain, not elsewhere classified: Secondary | ICD-10-CM | POA: Diagnosis not present

## 2022-01-13 DIAGNOSIS — R296 Repeated falls: Secondary | ICD-10-CM | POA: Diagnosis not present

## 2022-01-13 DIAGNOSIS — F0284 Dementia in other diseases classified elsewhere, unspecified severity, with anxiety: Secondary | ICD-10-CM | POA: Diagnosis not present

## 2022-01-15 DIAGNOSIS — E43 Unspecified severe protein-calorie malnutrition: Secondary | ICD-10-CM | POA: Diagnosis not present

## 2022-01-15 DIAGNOSIS — F0283 Dementia in other diseases classified elsewhere, unspecified severity, with mood disturbance: Secondary | ICD-10-CM | POA: Diagnosis not present

## 2022-01-15 DIAGNOSIS — Z6820 Body mass index (BMI) 20.0-20.9, adult: Secondary | ICD-10-CM | POA: Diagnosis not present

## 2022-01-15 DIAGNOSIS — F0284 Dementia in other diseases classified elsewhere, unspecified severity, with anxiety: Secondary | ICD-10-CM | POA: Diagnosis not present

## 2022-01-15 DIAGNOSIS — G311 Senile degeneration of brain, not elsewhere classified: Secondary | ICD-10-CM | POA: Diagnosis not present

## 2022-01-15 DIAGNOSIS — R296 Repeated falls: Secondary | ICD-10-CM | POA: Diagnosis not present

## 2022-01-16 DIAGNOSIS — Z6820 Body mass index (BMI) 20.0-20.9, adult: Secondary | ICD-10-CM | POA: Diagnosis not present

## 2022-01-16 DIAGNOSIS — E43 Unspecified severe protein-calorie malnutrition: Secondary | ICD-10-CM | POA: Diagnosis not present

## 2022-01-16 DIAGNOSIS — F0283 Dementia in other diseases classified elsewhere, unspecified severity, with mood disturbance: Secondary | ICD-10-CM | POA: Diagnosis not present

## 2022-01-16 DIAGNOSIS — F0284 Dementia in other diseases classified elsewhere, unspecified severity, with anxiety: Secondary | ICD-10-CM | POA: Diagnosis not present

## 2022-01-16 DIAGNOSIS — R296 Repeated falls: Secondary | ICD-10-CM | POA: Diagnosis not present

## 2022-01-16 DIAGNOSIS — G311 Senile degeneration of brain, not elsewhere classified: Secondary | ICD-10-CM | POA: Diagnosis not present

## 2022-01-21 DIAGNOSIS — E43 Unspecified severe protein-calorie malnutrition: Secondary | ICD-10-CM | POA: Diagnosis not present

## 2022-01-21 DIAGNOSIS — F0390 Unspecified dementia without behavioral disturbance: Secondary | ICD-10-CM | POA: Diagnosis not present

## 2022-01-21 DIAGNOSIS — Z6820 Body mass index (BMI) 20.0-20.9, adult: Secondary | ICD-10-CM | POA: Diagnosis not present

## 2022-01-21 DIAGNOSIS — F0284 Dementia in other diseases classified elsewhere, unspecified severity, with anxiety: Secondary | ICD-10-CM | POA: Diagnosis not present

## 2022-01-21 DIAGNOSIS — R296 Repeated falls: Secondary | ICD-10-CM | POA: Diagnosis not present

## 2022-01-21 DIAGNOSIS — F329 Major depressive disorder, single episode, unspecified: Secondary | ICD-10-CM | POA: Diagnosis not present

## 2022-01-21 DIAGNOSIS — K219 Gastro-esophageal reflux disease without esophagitis: Secondary | ICD-10-CM | POA: Diagnosis not present

## 2022-01-21 DIAGNOSIS — I480 Paroxysmal atrial fibrillation: Secondary | ICD-10-CM | POA: Diagnosis not present

## 2022-01-21 DIAGNOSIS — F0283 Dementia in other diseases classified elsewhere, unspecified severity, with mood disturbance: Secondary | ICD-10-CM | POA: Diagnosis not present

## 2022-01-21 DIAGNOSIS — R251 Tremor, unspecified: Secondary | ICD-10-CM | POA: Diagnosis not present

## 2022-01-21 DIAGNOSIS — R21 Rash and other nonspecific skin eruption: Secondary | ICD-10-CM | POA: Diagnosis not present

## 2022-01-21 DIAGNOSIS — G311 Senile degeneration of brain, not elsewhere classified: Secondary | ICD-10-CM | POA: Diagnosis not present

## 2022-01-23 DIAGNOSIS — F0284 Dementia in other diseases classified elsewhere, unspecified severity, with anxiety: Secondary | ICD-10-CM | POA: Diagnosis not present

## 2022-01-23 DIAGNOSIS — G311 Senile degeneration of brain, not elsewhere classified: Secondary | ICD-10-CM | POA: Diagnosis not present

## 2022-01-23 DIAGNOSIS — E43 Unspecified severe protein-calorie malnutrition: Secondary | ICD-10-CM | POA: Diagnosis not present

## 2022-01-23 DIAGNOSIS — R296 Repeated falls: Secondary | ICD-10-CM | POA: Diagnosis not present

## 2022-01-23 DIAGNOSIS — Z6820 Body mass index (BMI) 20.0-20.9, adult: Secondary | ICD-10-CM | POA: Diagnosis not present

## 2022-01-23 DIAGNOSIS — F0283 Dementia in other diseases classified elsewhere, unspecified severity, with mood disturbance: Secondary | ICD-10-CM | POA: Diagnosis not present

## 2022-01-29 DIAGNOSIS — F0283 Dementia in other diseases classified elsewhere, unspecified severity, with mood disturbance: Secondary | ICD-10-CM | POA: Diagnosis not present

## 2022-01-29 DIAGNOSIS — E43 Unspecified severe protein-calorie malnutrition: Secondary | ICD-10-CM | POA: Diagnosis not present

## 2022-01-29 DIAGNOSIS — R296 Repeated falls: Secondary | ICD-10-CM | POA: Diagnosis not present

## 2022-01-29 DIAGNOSIS — Z6820 Body mass index (BMI) 20.0-20.9, adult: Secondary | ICD-10-CM | POA: Diagnosis not present

## 2022-01-29 DIAGNOSIS — F0284 Dementia in other diseases classified elsewhere, unspecified severity, with anxiety: Secondary | ICD-10-CM | POA: Diagnosis not present

## 2022-01-29 DIAGNOSIS — G311 Senile degeneration of brain, not elsewhere classified: Secondary | ICD-10-CM | POA: Diagnosis not present

## 2022-01-30 DIAGNOSIS — E43 Unspecified severe protein-calorie malnutrition: Secondary | ICD-10-CM | POA: Diagnosis not present

## 2022-01-30 DIAGNOSIS — Z6820 Body mass index (BMI) 20.0-20.9, adult: Secondary | ICD-10-CM | POA: Diagnosis not present

## 2022-01-30 DIAGNOSIS — F0283 Dementia in other diseases classified elsewhere, unspecified severity, with mood disturbance: Secondary | ICD-10-CM | POA: Diagnosis not present

## 2022-01-30 DIAGNOSIS — F0284 Dementia in other diseases classified elsewhere, unspecified severity, with anxiety: Secondary | ICD-10-CM | POA: Diagnosis not present

## 2022-01-30 DIAGNOSIS — G311 Senile degeneration of brain, not elsewhere classified: Secondary | ICD-10-CM | POA: Diagnosis not present

## 2022-01-30 DIAGNOSIS — R296 Repeated falls: Secondary | ICD-10-CM | POA: Diagnosis not present

## 2022-02-03 DIAGNOSIS — F0284 Dementia in other diseases classified elsewhere, unspecified severity, with anxiety: Secondary | ICD-10-CM | POA: Diagnosis not present

## 2022-02-03 DIAGNOSIS — F0283 Dementia in other diseases classified elsewhere, unspecified severity, with mood disturbance: Secondary | ICD-10-CM | POA: Diagnosis not present

## 2022-02-03 DIAGNOSIS — G311 Senile degeneration of brain, not elsewhere classified: Secondary | ICD-10-CM | POA: Diagnosis not present

## 2022-02-03 DIAGNOSIS — E43 Unspecified severe protein-calorie malnutrition: Secondary | ICD-10-CM | POA: Diagnosis not present

## 2022-02-03 DIAGNOSIS — R296 Repeated falls: Secondary | ICD-10-CM | POA: Diagnosis not present

## 2022-02-03 DIAGNOSIS — Z6820 Body mass index (BMI) 20.0-20.9, adult: Secondary | ICD-10-CM | POA: Diagnosis not present

## 2022-02-04 DIAGNOSIS — F0284 Dementia in other diseases classified elsewhere, unspecified severity, with anxiety: Secondary | ICD-10-CM | POA: Diagnosis not present

## 2022-02-04 DIAGNOSIS — F0283 Dementia in other diseases classified elsewhere, unspecified severity, with mood disturbance: Secondary | ICD-10-CM | POA: Diagnosis not present

## 2022-02-04 DIAGNOSIS — G311 Senile degeneration of brain, not elsewhere classified: Secondary | ICD-10-CM | POA: Diagnosis not present

## 2022-02-04 DIAGNOSIS — Z6820 Body mass index (BMI) 20.0-20.9, adult: Secondary | ICD-10-CM | POA: Diagnosis not present

## 2022-02-04 DIAGNOSIS — R296 Repeated falls: Secondary | ICD-10-CM | POA: Diagnosis not present

## 2022-02-04 DIAGNOSIS — E43 Unspecified severe protein-calorie malnutrition: Secondary | ICD-10-CM | POA: Diagnosis not present

## 2022-02-05 ENCOUNTER — Ambulatory Visit: Payer: Medicare Other | Admitting: Podiatry

## 2022-02-05 DIAGNOSIS — Z20822 Contact with and (suspected) exposure to covid-19: Secondary | ICD-10-CM | POA: Diagnosis not present

## 2022-02-06 DIAGNOSIS — R296 Repeated falls: Secondary | ICD-10-CM | POA: Diagnosis not present

## 2022-02-06 DIAGNOSIS — G311 Senile degeneration of brain, not elsewhere classified: Secondary | ICD-10-CM | POA: Diagnosis not present

## 2022-02-06 DIAGNOSIS — F0283 Dementia in other diseases classified elsewhere, unspecified severity, with mood disturbance: Secondary | ICD-10-CM | POA: Diagnosis not present

## 2022-02-06 DIAGNOSIS — Z6820 Body mass index (BMI) 20.0-20.9, adult: Secondary | ICD-10-CM | POA: Diagnosis not present

## 2022-02-06 DIAGNOSIS — E43 Unspecified severe protein-calorie malnutrition: Secondary | ICD-10-CM | POA: Diagnosis not present

## 2022-02-06 DIAGNOSIS — F0284 Dementia in other diseases classified elsewhere, unspecified severity, with anxiety: Secondary | ICD-10-CM | POA: Diagnosis not present

## 2022-02-08 DIAGNOSIS — M810 Age-related osteoporosis without current pathological fracture: Secondary | ICD-10-CM | POA: Diagnosis not present

## 2022-02-08 DIAGNOSIS — G311 Senile degeneration of brain, not elsewhere classified: Secondary | ICD-10-CM | POA: Diagnosis not present

## 2022-02-08 DIAGNOSIS — Z6820 Body mass index (BMI) 20.0-20.9, adult: Secondary | ICD-10-CM | POA: Diagnosis not present

## 2022-02-08 DIAGNOSIS — E785 Hyperlipidemia, unspecified: Secondary | ICD-10-CM | POA: Diagnosis not present

## 2022-02-08 DIAGNOSIS — Z8719 Personal history of other diseases of the digestive system: Secondary | ICD-10-CM | POA: Diagnosis not present

## 2022-02-08 DIAGNOSIS — N1831 Chronic kidney disease, stage 3a: Secondary | ICD-10-CM | POA: Diagnosis not present

## 2022-02-08 DIAGNOSIS — H409 Unspecified glaucoma: Secondary | ICD-10-CM | POA: Diagnosis not present

## 2022-02-08 DIAGNOSIS — F0283 Dementia in other diseases classified elsewhere, unspecified severity, with mood disturbance: Secondary | ICD-10-CM | POA: Diagnosis not present

## 2022-02-08 DIAGNOSIS — D631 Anemia in chronic kidney disease: Secondary | ICD-10-CM | POA: Diagnosis not present

## 2022-02-08 DIAGNOSIS — Z87440 Personal history of urinary (tract) infections: Secondary | ICD-10-CM | POA: Diagnosis not present

## 2022-02-08 DIAGNOSIS — I129 Hypertensive chronic kidney disease with stage 1 through stage 4 chronic kidney disease, or unspecified chronic kidney disease: Secondary | ICD-10-CM | POA: Diagnosis not present

## 2022-02-08 DIAGNOSIS — G40909 Epilepsy, unspecified, not intractable, without status epilepticus: Secondary | ICD-10-CM | POA: Diagnosis not present

## 2022-02-08 DIAGNOSIS — L299 Pruritus, unspecified: Secondary | ICD-10-CM | POA: Diagnosis not present

## 2022-02-08 DIAGNOSIS — E538 Deficiency of other specified B group vitamins: Secondary | ICD-10-CM | POA: Diagnosis not present

## 2022-02-08 DIAGNOSIS — E43 Unspecified severe protein-calorie malnutrition: Secondary | ICD-10-CM | POA: Diagnosis not present

## 2022-02-08 DIAGNOSIS — K219 Gastro-esophageal reflux disease without esophagitis: Secondary | ICD-10-CM | POA: Diagnosis not present

## 2022-02-08 DIAGNOSIS — F0284 Dementia in other diseases classified elsewhere, unspecified severity, with anxiety: Secondary | ICD-10-CM | POA: Diagnosis not present

## 2022-02-08 DIAGNOSIS — I4891 Unspecified atrial fibrillation: Secondary | ICD-10-CM | POA: Diagnosis not present

## 2022-02-08 DIAGNOSIS — R296 Repeated falls: Secondary | ICD-10-CM | POA: Diagnosis not present

## 2022-02-10 DIAGNOSIS — R296 Repeated falls: Secondary | ICD-10-CM | POA: Diagnosis not present

## 2022-02-10 DIAGNOSIS — E43 Unspecified severe protein-calorie malnutrition: Secondary | ICD-10-CM | POA: Diagnosis not present

## 2022-02-10 DIAGNOSIS — G311 Senile degeneration of brain, not elsewhere classified: Secondary | ICD-10-CM | POA: Diagnosis not present

## 2022-02-10 DIAGNOSIS — F0283 Dementia in other diseases classified elsewhere, unspecified severity, with mood disturbance: Secondary | ICD-10-CM | POA: Diagnosis not present

## 2022-02-10 DIAGNOSIS — Z6820 Body mass index (BMI) 20.0-20.9, adult: Secondary | ICD-10-CM | POA: Diagnosis not present

## 2022-02-10 DIAGNOSIS — F0284 Dementia in other diseases classified elsewhere, unspecified severity, with anxiety: Secondary | ICD-10-CM | POA: Diagnosis not present

## 2022-02-11 DIAGNOSIS — E43 Unspecified severe protein-calorie malnutrition: Secondary | ICD-10-CM | POA: Diagnosis not present

## 2022-02-11 DIAGNOSIS — R296 Repeated falls: Secondary | ICD-10-CM | POA: Diagnosis not present

## 2022-02-11 DIAGNOSIS — Z6820 Body mass index (BMI) 20.0-20.9, adult: Secondary | ICD-10-CM | POA: Diagnosis not present

## 2022-02-11 DIAGNOSIS — F0284 Dementia in other diseases classified elsewhere, unspecified severity, with anxiety: Secondary | ICD-10-CM | POA: Diagnosis not present

## 2022-02-11 DIAGNOSIS — G311 Senile degeneration of brain, not elsewhere classified: Secondary | ICD-10-CM | POA: Diagnosis not present

## 2022-02-11 DIAGNOSIS — F0283 Dementia in other diseases classified elsewhere, unspecified severity, with mood disturbance: Secondary | ICD-10-CM | POA: Diagnosis not present

## 2022-02-12 DIAGNOSIS — R296 Repeated falls: Secondary | ICD-10-CM | POA: Diagnosis not present

## 2022-02-12 DIAGNOSIS — E43 Unspecified severe protein-calorie malnutrition: Secondary | ICD-10-CM | POA: Diagnosis not present

## 2022-02-12 DIAGNOSIS — F0283 Dementia in other diseases classified elsewhere, unspecified severity, with mood disturbance: Secondary | ICD-10-CM | POA: Diagnosis not present

## 2022-02-12 DIAGNOSIS — G311 Senile degeneration of brain, not elsewhere classified: Secondary | ICD-10-CM | POA: Diagnosis not present

## 2022-02-12 DIAGNOSIS — F0284 Dementia in other diseases classified elsewhere, unspecified severity, with anxiety: Secondary | ICD-10-CM | POA: Diagnosis not present

## 2022-02-12 DIAGNOSIS — Z6820 Body mass index (BMI) 20.0-20.9, adult: Secondary | ICD-10-CM | POA: Diagnosis not present

## 2022-02-13 DIAGNOSIS — R296 Repeated falls: Secondary | ICD-10-CM | POA: Diagnosis not present

## 2022-02-13 DIAGNOSIS — F0284 Dementia in other diseases classified elsewhere, unspecified severity, with anxiety: Secondary | ICD-10-CM | POA: Diagnosis not present

## 2022-02-13 DIAGNOSIS — E43 Unspecified severe protein-calorie malnutrition: Secondary | ICD-10-CM | POA: Diagnosis not present

## 2022-02-13 DIAGNOSIS — F0283 Dementia in other diseases classified elsewhere, unspecified severity, with mood disturbance: Secondary | ICD-10-CM | POA: Diagnosis not present

## 2022-02-13 DIAGNOSIS — Z6820 Body mass index (BMI) 20.0-20.9, adult: Secondary | ICD-10-CM | POA: Diagnosis not present

## 2022-02-13 DIAGNOSIS — G311 Senile degeneration of brain, not elsewhere classified: Secondary | ICD-10-CM | POA: Diagnosis not present

## 2022-02-16 DIAGNOSIS — G4089 Other seizures: Secondary | ICD-10-CM | POA: Diagnosis not present

## 2022-02-16 DIAGNOSIS — F0390 Unspecified dementia without behavioral disturbance: Secondary | ICD-10-CM | POA: Diagnosis not present

## 2022-02-16 DIAGNOSIS — I480 Paroxysmal atrial fibrillation: Secondary | ICD-10-CM | POA: Diagnosis not present

## 2022-02-16 DIAGNOSIS — H409 Unspecified glaucoma: Secondary | ICD-10-CM | POA: Diagnosis not present

## 2022-02-16 DIAGNOSIS — F329 Major depressive disorder, single episode, unspecified: Secondary | ICD-10-CM | POA: Diagnosis not present

## 2022-02-16 DIAGNOSIS — I10 Essential (primary) hypertension: Secondary | ICD-10-CM | POA: Diagnosis not present

## 2022-02-17 DIAGNOSIS — E43 Unspecified severe protein-calorie malnutrition: Secondary | ICD-10-CM | POA: Diagnosis not present

## 2022-02-17 DIAGNOSIS — Z6820 Body mass index (BMI) 20.0-20.9, adult: Secondary | ICD-10-CM | POA: Diagnosis not present

## 2022-02-17 DIAGNOSIS — F0283 Dementia in other diseases classified elsewhere, unspecified severity, with mood disturbance: Secondary | ICD-10-CM | POA: Diagnosis not present

## 2022-02-17 DIAGNOSIS — F0284 Dementia in other diseases classified elsewhere, unspecified severity, with anxiety: Secondary | ICD-10-CM | POA: Diagnosis not present

## 2022-02-17 DIAGNOSIS — G311 Senile degeneration of brain, not elsewhere classified: Secondary | ICD-10-CM | POA: Diagnosis not present

## 2022-02-17 DIAGNOSIS — R296 Repeated falls: Secondary | ICD-10-CM | POA: Diagnosis not present

## 2022-02-19 DIAGNOSIS — F0283 Dementia in other diseases classified elsewhere, unspecified severity, with mood disturbance: Secondary | ICD-10-CM | POA: Diagnosis not present

## 2022-02-19 DIAGNOSIS — G311 Senile degeneration of brain, not elsewhere classified: Secondary | ICD-10-CM | POA: Diagnosis not present

## 2022-02-19 DIAGNOSIS — R296 Repeated falls: Secondary | ICD-10-CM | POA: Diagnosis not present

## 2022-02-19 DIAGNOSIS — E43 Unspecified severe protein-calorie malnutrition: Secondary | ICD-10-CM | POA: Diagnosis not present

## 2022-02-19 DIAGNOSIS — Z20822 Contact with and (suspected) exposure to covid-19: Secondary | ICD-10-CM | POA: Diagnosis not present

## 2022-02-19 DIAGNOSIS — F0284 Dementia in other diseases classified elsewhere, unspecified severity, with anxiety: Secondary | ICD-10-CM | POA: Diagnosis not present

## 2022-02-19 DIAGNOSIS — Z6820 Body mass index (BMI) 20.0-20.9, adult: Secondary | ICD-10-CM | POA: Diagnosis not present

## 2022-02-20 DIAGNOSIS — F0283 Dementia in other diseases classified elsewhere, unspecified severity, with mood disturbance: Secondary | ICD-10-CM | POA: Diagnosis not present

## 2022-02-20 DIAGNOSIS — R296 Repeated falls: Secondary | ICD-10-CM | POA: Diagnosis not present

## 2022-02-20 DIAGNOSIS — G311 Senile degeneration of brain, not elsewhere classified: Secondary | ICD-10-CM | POA: Diagnosis not present

## 2022-02-20 DIAGNOSIS — F0284 Dementia in other diseases classified elsewhere, unspecified severity, with anxiety: Secondary | ICD-10-CM | POA: Diagnosis not present

## 2022-02-20 DIAGNOSIS — E43 Unspecified severe protein-calorie malnutrition: Secondary | ICD-10-CM | POA: Diagnosis not present

## 2022-02-20 DIAGNOSIS — Z6820 Body mass index (BMI) 20.0-20.9, adult: Secondary | ICD-10-CM | POA: Diagnosis not present

## 2022-02-21 DIAGNOSIS — F329 Major depressive disorder, single episode, unspecified: Secondary | ICD-10-CM | POA: Diagnosis not present

## 2022-02-21 DIAGNOSIS — F0390 Unspecified dementia without behavioral disturbance: Secondary | ICD-10-CM | POA: Diagnosis not present

## 2022-02-21 DIAGNOSIS — I10 Essential (primary) hypertension: Secondary | ICD-10-CM | POA: Diagnosis not present

## 2022-02-21 DIAGNOSIS — I480 Paroxysmal atrial fibrillation: Secondary | ICD-10-CM | POA: Diagnosis not present

## 2022-02-24 DIAGNOSIS — F0283 Dementia in other diseases classified elsewhere, unspecified severity, with mood disturbance: Secondary | ICD-10-CM | POA: Diagnosis not present

## 2022-02-24 DIAGNOSIS — G311 Senile degeneration of brain, not elsewhere classified: Secondary | ICD-10-CM | POA: Diagnosis not present

## 2022-02-24 DIAGNOSIS — Z20822 Contact with and (suspected) exposure to covid-19: Secondary | ICD-10-CM | POA: Diagnosis not present

## 2022-02-24 DIAGNOSIS — F0284 Dementia in other diseases classified elsewhere, unspecified severity, with anxiety: Secondary | ICD-10-CM | POA: Diagnosis not present

## 2022-02-24 DIAGNOSIS — R296 Repeated falls: Secondary | ICD-10-CM | POA: Diagnosis not present

## 2022-02-24 DIAGNOSIS — Z6820 Body mass index (BMI) 20.0-20.9, adult: Secondary | ICD-10-CM | POA: Diagnosis not present

## 2022-02-24 DIAGNOSIS — E43 Unspecified severe protein-calorie malnutrition: Secondary | ICD-10-CM | POA: Diagnosis not present

## 2022-02-26 DIAGNOSIS — F0390 Unspecified dementia without behavioral disturbance: Secondary | ICD-10-CM | POA: Diagnosis not present

## 2022-02-26 DIAGNOSIS — Z6820 Body mass index (BMI) 20.0-20.9, adult: Secondary | ICD-10-CM | POA: Diagnosis not present

## 2022-02-26 DIAGNOSIS — R296 Repeated falls: Secondary | ICD-10-CM | POA: Diagnosis not present

## 2022-02-26 DIAGNOSIS — F0284 Dementia in other diseases classified elsewhere, unspecified severity, with anxiety: Secondary | ICD-10-CM | POA: Diagnosis not present

## 2022-02-26 DIAGNOSIS — D649 Anemia, unspecified: Secondary | ICD-10-CM | POA: Diagnosis not present

## 2022-02-26 DIAGNOSIS — G9340 Encephalopathy, unspecified: Secondary | ICD-10-CM | POA: Diagnosis not present

## 2022-02-26 DIAGNOSIS — E43 Unspecified severe protein-calorie malnutrition: Secondary | ICD-10-CM | POA: Diagnosis not present

## 2022-02-26 DIAGNOSIS — G40909 Epilepsy, unspecified, not intractable, without status epilepticus: Secondary | ICD-10-CM | POA: Diagnosis not present

## 2022-02-26 DIAGNOSIS — F0283 Dementia in other diseases classified elsewhere, unspecified severity, with mood disturbance: Secondary | ICD-10-CM | POA: Diagnosis not present

## 2022-02-26 DIAGNOSIS — G311 Senile degeneration of brain, not elsewhere classified: Secondary | ICD-10-CM | POA: Diagnosis not present

## 2022-02-27 DIAGNOSIS — E43 Unspecified severe protein-calorie malnutrition: Secondary | ICD-10-CM | POA: Diagnosis not present

## 2022-02-27 DIAGNOSIS — F0283 Dementia in other diseases classified elsewhere, unspecified severity, with mood disturbance: Secondary | ICD-10-CM | POA: Diagnosis not present

## 2022-02-27 DIAGNOSIS — G311 Senile degeneration of brain, not elsewhere classified: Secondary | ICD-10-CM | POA: Diagnosis not present

## 2022-02-27 DIAGNOSIS — F0284 Dementia in other diseases classified elsewhere, unspecified severity, with anxiety: Secondary | ICD-10-CM | POA: Diagnosis not present

## 2022-02-27 DIAGNOSIS — Z6820 Body mass index (BMI) 20.0-20.9, adult: Secondary | ICD-10-CM | POA: Diagnosis not present

## 2022-02-27 DIAGNOSIS — R296 Repeated falls: Secondary | ICD-10-CM | POA: Diagnosis not present

## 2022-03-03 DIAGNOSIS — R296 Repeated falls: Secondary | ICD-10-CM | POA: Diagnosis not present

## 2022-03-03 DIAGNOSIS — F0284 Dementia in other diseases classified elsewhere, unspecified severity, with anxiety: Secondary | ICD-10-CM | POA: Diagnosis not present

## 2022-03-03 DIAGNOSIS — G311 Senile degeneration of brain, not elsewhere classified: Secondary | ICD-10-CM | POA: Diagnosis not present

## 2022-03-03 DIAGNOSIS — E43 Unspecified severe protein-calorie malnutrition: Secondary | ICD-10-CM | POA: Diagnosis not present

## 2022-03-03 DIAGNOSIS — Z6820 Body mass index (BMI) 20.0-20.9, adult: Secondary | ICD-10-CM | POA: Diagnosis not present

## 2022-03-03 DIAGNOSIS — F0283 Dementia in other diseases classified elsewhere, unspecified severity, with mood disturbance: Secondary | ICD-10-CM | POA: Diagnosis not present

## 2022-03-05 DIAGNOSIS — F0390 Unspecified dementia without behavioral disturbance: Secondary | ICD-10-CM | POA: Diagnosis not present

## 2022-03-05 DIAGNOSIS — F329 Major depressive disorder, single episode, unspecified: Secondary | ICD-10-CM | POA: Diagnosis not present

## 2022-03-05 DIAGNOSIS — R296 Repeated falls: Secondary | ICD-10-CM | POA: Diagnosis not present

## 2022-03-05 DIAGNOSIS — M150 Primary generalized (osteo)arthritis: Secondary | ICD-10-CM | POA: Diagnosis not present

## 2022-03-05 DIAGNOSIS — F0284 Dementia in other diseases classified elsewhere, unspecified severity, with anxiety: Secondary | ICD-10-CM | POA: Diagnosis not present

## 2022-03-05 DIAGNOSIS — G311 Senile degeneration of brain, not elsewhere classified: Secondary | ICD-10-CM | POA: Diagnosis not present

## 2022-03-05 DIAGNOSIS — I10 Essential (primary) hypertension: Secondary | ICD-10-CM | POA: Diagnosis not present

## 2022-03-05 DIAGNOSIS — Z6820 Body mass index (BMI) 20.0-20.9, adult: Secondary | ICD-10-CM | POA: Diagnosis not present

## 2022-03-05 DIAGNOSIS — E43 Unspecified severe protein-calorie malnutrition: Secondary | ICD-10-CM | POA: Diagnosis not present

## 2022-03-05 DIAGNOSIS — I480 Paroxysmal atrial fibrillation: Secondary | ICD-10-CM | POA: Diagnosis not present

## 2022-03-05 DIAGNOSIS — F0283 Dementia in other diseases classified elsewhere, unspecified severity, with mood disturbance: Secondary | ICD-10-CM | POA: Diagnosis not present

## 2022-03-06 DIAGNOSIS — Z6820 Body mass index (BMI) 20.0-20.9, adult: Secondary | ICD-10-CM | POA: Diagnosis not present

## 2022-03-06 DIAGNOSIS — F0283 Dementia in other diseases classified elsewhere, unspecified severity, with mood disturbance: Secondary | ICD-10-CM | POA: Diagnosis not present

## 2022-03-06 DIAGNOSIS — F0284 Dementia in other diseases classified elsewhere, unspecified severity, with anxiety: Secondary | ICD-10-CM | POA: Diagnosis not present

## 2022-03-06 DIAGNOSIS — R296 Repeated falls: Secondary | ICD-10-CM | POA: Diagnosis not present

## 2022-03-06 DIAGNOSIS — G311 Senile degeneration of brain, not elsewhere classified: Secondary | ICD-10-CM | POA: Diagnosis not present

## 2022-03-06 DIAGNOSIS — E43 Unspecified severe protein-calorie malnutrition: Secondary | ICD-10-CM | POA: Diagnosis not present

## 2022-03-10 DIAGNOSIS — D631 Anemia in chronic kidney disease: Secondary | ICD-10-CM | POA: Diagnosis not present

## 2022-03-10 DIAGNOSIS — E538 Deficiency of other specified B group vitamins: Secondary | ICD-10-CM | POA: Diagnosis not present

## 2022-03-10 DIAGNOSIS — G40909 Epilepsy, unspecified, not intractable, without status epilepticus: Secondary | ICD-10-CM | POA: Diagnosis not present

## 2022-03-10 DIAGNOSIS — F0284 Dementia in other diseases classified elsewhere, unspecified severity, with anxiety: Secondary | ICD-10-CM | POA: Diagnosis not present

## 2022-03-10 DIAGNOSIS — E43 Unspecified severe protein-calorie malnutrition: Secondary | ICD-10-CM | POA: Diagnosis not present

## 2022-03-10 DIAGNOSIS — N1831 Chronic kidney disease, stage 3a: Secondary | ICD-10-CM | POA: Diagnosis not present

## 2022-03-10 DIAGNOSIS — Z8719 Personal history of other diseases of the digestive system: Secondary | ICD-10-CM | POA: Diagnosis not present

## 2022-03-10 DIAGNOSIS — I129 Hypertensive chronic kidney disease with stage 1 through stage 4 chronic kidney disease, or unspecified chronic kidney disease: Secondary | ICD-10-CM | POA: Diagnosis not present

## 2022-03-10 DIAGNOSIS — M810 Age-related osteoporosis without current pathological fracture: Secondary | ICD-10-CM | POA: Diagnosis not present

## 2022-03-10 DIAGNOSIS — I4891 Unspecified atrial fibrillation: Secondary | ICD-10-CM | POA: Diagnosis not present

## 2022-03-10 DIAGNOSIS — Z87440 Personal history of urinary (tract) infections: Secondary | ICD-10-CM | POA: Diagnosis not present

## 2022-03-10 DIAGNOSIS — G311 Senile degeneration of brain, not elsewhere classified: Secondary | ICD-10-CM | POA: Diagnosis not present

## 2022-03-10 DIAGNOSIS — L299 Pruritus, unspecified: Secondary | ICD-10-CM | POA: Diagnosis not present

## 2022-03-10 DIAGNOSIS — Z6820 Body mass index (BMI) 20.0-20.9, adult: Secondary | ICD-10-CM | POA: Diagnosis not present

## 2022-03-10 DIAGNOSIS — E785 Hyperlipidemia, unspecified: Secondary | ICD-10-CM | POA: Diagnosis not present

## 2022-03-10 DIAGNOSIS — F0283 Dementia in other diseases classified elsewhere, unspecified severity, with mood disturbance: Secondary | ICD-10-CM | POA: Diagnosis not present

## 2022-03-10 DIAGNOSIS — K219 Gastro-esophageal reflux disease without esophagitis: Secondary | ICD-10-CM | POA: Diagnosis not present

## 2022-03-10 DIAGNOSIS — H409 Unspecified glaucoma: Secondary | ICD-10-CM | POA: Diagnosis not present

## 2022-03-10 DIAGNOSIS — R296 Repeated falls: Secondary | ICD-10-CM | POA: Diagnosis not present

## 2022-03-11 DIAGNOSIS — E43 Unspecified severe protein-calorie malnutrition: Secondary | ICD-10-CM | POA: Diagnosis not present

## 2022-03-11 DIAGNOSIS — Z6820 Body mass index (BMI) 20.0-20.9, adult: Secondary | ICD-10-CM | POA: Diagnosis not present

## 2022-03-11 DIAGNOSIS — F0283 Dementia in other diseases classified elsewhere, unspecified severity, with mood disturbance: Secondary | ICD-10-CM | POA: Diagnosis not present

## 2022-03-11 DIAGNOSIS — F0284 Dementia in other diseases classified elsewhere, unspecified severity, with anxiety: Secondary | ICD-10-CM | POA: Diagnosis not present

## 2022-03-11 DIAGNOSIS — G311 Senile degeneration of brain, not elsewhere classified: Secondary | ICD-10-CM | POA: Diagnosis not present

## 2022-03-11 DIAGNOSIS — R296 Repeated falls: Secondary | ICD-10-CM | POA: Diagnosis not present

## 2022-03-13 DIAGNOSIS — G311 Senile degeneration of brain, not elsewhere classified: Secondary | ICD-10-CM | POA: Diagnosis not present

## 2022-03-13 DIAGNOSIS — F0284 Dementia in other diseases classified elsewhere, unspecified severity, with anxiety: Secondary | ICD-10-CM | POA: Diagnosis not present

## 2022-03-13 DIAGNOSIS — Z6820 Body mass index (BMI) 20.0-20.9, adult: Secondary | ICD-10-CM | POA: Diagnosis not present

## 2022-03-13 DIAGNOSIS — F0283 Dementia in other diseases classified elsewhere, unspecified severity, with mood disturbance: Secondary | ICD-10-CM | POA: Diagnosis not present

## 2022-03-13 DIAGNOSIS — E43 Unspecified severe protein-calorie malnutrition: Secondary | ICD-10-CM | POA: Diagnosis not present

## 2022-03-13 DIAGNOSIS — R296 Repeated falls: Secondary | ICD-10-CM | POA: Diagnosis not present

## 2022-03-14 DIAGNOSIS — Z20822 Contact with and (suspected) exposure to covid-19: Secondary | ICD-10-CM | POA: Diagnosis not present

## 2022-03-17 DIAGNOSIS — F0283 Dementia in other diseases classified elsewhere, unspecified severity, with mood disturbance: Secondary | ICD-10-CM | POA: Diagnosis not present

## 2022-03-17 DIAGNOSIS — Z20822 Contact with and (suspected) exposure to covid-19: Secondary | ICD-10-CM | POA: Diagnosis not present

## 2022-03-17 DIAGNOSIS — R296 Repeated falls: Secondary | ICD-10-CM | POA: Diagnosis not present

## 2022-03-17 DIAGNOSIS — F0284 Dementia in other diseases classified elsewhere, unspecified severity, with anxiety: Secondary | ICD-10-CM | POA: Diagnosis not present

## 2022-03-17 DIAGNOSIS — E43 Unspecified severe protein-calorie malnutrition: Secondary | ICD-10-CM | POA: Diagnosis not present

## 2022-03-17 DIAGNOSIS — G311 Senile degeneration of brain, not elsewhere classified: Secondary | ICD-10-CM | POA: Diagnosis not present

## 2022-03-17 DIAGNOSIS — Z6820 Body mass index (BMI) 20.0-20.9, adult: Secondary | ICD-10-CM | POA: Diagnosis not present

## 2022-03-18 DIAGNOSIS — Z6820 Body mass index (BMI) 20.0-20.9, adult: Secondary | ICD-10-CM | POA: Diagnosis not present

## 2022-03-18 DIAGNOSIS — F0283 Dementia in other diseases classified elsewhere, unspecified severity, with mood disturbance: Secondary | ICD-10-CM | POA: Diagnosis not present

## 2022-03-18 DIAGNOSIS — F0284 Dementia in other diseases classified elsewhere, unspecified severity, with anxiety: Secondary | ICD-10-CM | POA: Diagnosis not present

## 2022-03-18 DIAGNOSIS — R296 Repeated falls: Secondary | ICD-10-CM | POA: Diagnosis not present

## 2022-03-18 DIAGNOSIS — E43 Unspecified severe protein-calorie malnutrition: Secondary | ICD-10-CM | POA: Diagnosis not present

## 2022-03-18 DIAGNOSIS — G311 Senile degeneration of brain, not elsewhere classified: Secondary | ICD-10-CM | POA: Diagnosis not present

## 2022-03-20 DIAGNOSIS — I10 Essential (primary) hypertension: Secondary | ICD-10-CM | POA: Diagnosis not present

## 2022-03-20 DIAGNOSIS — R296 Repeated falls: Secondary | ICD-10-CM | POA: Diagnosis not present

## 2022-03-20 DIAGNOSIS — K219 Gastro-esophageal reflux disease without esophagitis: Secondary | ICD-10-CM | POA: Diagnosis not present

## 2022-03-20 DIAGNOSIS — I480 Paroxysmal atrial fibrillation: Secondary | ICD-10-CM | POA: Diagnosis not present

## 2022-03-20 DIAGNOSIS — F0390 Unspecified dementia without behavioral disturbance: Secondary | ICD-10-CM | POA: Diagnosis not present

## 2022-03-20 DIAGNOSIS — Z6820 Body mass index (BMI) 20.0-20.9, adult: Secondary | ICD-10-CM | POA: Diagnosis not present

## 2022-03-20 DIAGNOSIS — G311 Senile degeneration of brain, not elsewhere classified: Secondary | ICD-10-CM | POA: Diagnosis not present

## 2022-03-20 DIAGNOSIS — F0283 Dementia in other diseases classified elsewhere, unspecified severity, with mood disturbance: Secondary | ICD-10-CM | POA: Diagnosis not present

## 2022-03-20 DIAGNOSIS — M150 Primary generalized (osteo)arthritis: Secondary | ICD-10-CM | POA: Diagnosis not present

## 2022-03-20 DIAGNOSIS — F0284 Dementia in other diseases classified elsewhere, unspecified severity, with anxiety: Secondary | ICD-10-CM | POA: Diagnosis not present

## 2022-03-20 DIAGNOSIS — E43 Unspecified severe protein-calorie malnutrition: Secondary | ICD-10-CM | POA: Diagnosis not present

## 2022-03-24 DIAGNOSIS — Z6820 Body mass index (BMI) 20.0-20.9, adult: Secondary | ICD-10-CM | POA: Diagnosis not present

## 2022-03-24 DIAGNOSIS — G311 Senile degeneration of brain, not elsewhere classified: Secondary | ICD-10-CM | POA: Diagnosis not present

## 2022-03-24 DIAGNOSIS — R296 Repeated falls: Secondary | ICD-10-CM | POA: Diagnosis not present

## 2022-03-24 DIAGNOSIS — F0283 Dementia in other diseases classified elsewhere, unspecified severity, with mood disturbance: Secondary | ICD-10-CM | POA: Diagnosis not present

## 2022-03-24 DIAGNOSIS — F0284 Dementia in other diseases classified elsewhere, unspecified severity, with anxiety: Secondary | ICD-10-CM | POA: Diagnosis not present

## 2022-03-24 DIAGNOSIS — E43 Unspecified severe protein-calorie malnutrition: Secondary | ICD-10-CM | POA: Diagnosis not present

## 2022-03-26 DIAGNOSIS — I480 Paroxysmal atrial fibrillation: Secondary | ICD-10-CM | POA: Diagnosis not present

## 2022-03-26 DIAGNOSIS — G311 Senile degeneration of brain, not elsewhere classified: Secondary | ICD-10-CM | POA: Diagnosis not present

## 2022-03-26 DIAGNOSIS — I10 Essential (primary) hypertension: Secondary | ICD-10-CM | POA: Diagnosis not present

## 2022-03-26 DIAGNOSIS — K219 Gastro-esophageal reflux disease without esophagitis: Secondary | ICD-10-CM | POA: Diagnosis not present

## 2022-03-26 DIAGNOSIS — R296 Repeated falls: Secondary | ICD-10-CM | POA: Diagnosis not present

## 2022-03-26 DIAGNOSIS — F0284 Dementia in other diseases classified elsewhere, unspecified severity, with anxiety: Secondary | ICD-10-CM | POA: Diagnosis not present

## 2022-03-26 DIAGNOSIS — Z6820 Body mass index (BMI) 20.0-20.9, adult: Secondary | ICD-10-CM | POA: Diagnosis not present

## 2022-03-26 DIAGNOSIS — E43 Unspecified severe protein-calorie malnutrition: Secondary | ICD-10-CM | POA: Diagnosis not present

## 2022-03-26 DIAGNOSIS — F0283 Dementia in other diseases classified elsewhere, unspecified severity, with mood disturbance: Secondary | ICD-10-CM | POA: Diagnosis not present

## 2022-03-26 DIAGNOSIS — F0390 Unspecified dementia without behavioral disturbance: Secondary | ICD-10-CM | POA: Diagnosis not present

## 2022-03-26 DIAGNOSIS — F329 Major depressive disorder, single episode, unspecified: Secondary | ICD-10-CM | POA: Diagnosis not present

## 2022-03-27 DIAGNOSIS — E43 Unspecified severe protein-calorie malnutrition: Secondary | ICD-10-CM | POA: Diagnosis not present

## 2022-03-27 DIAGNOSIS — G311 Senile degeneration of brain, not elsewhere classified: Secondary | ICD-10-CM | POA: Diagnosis not present

## 2022-03-27 DIAGNOSIS — F0284 Dementia in other diseases classified elsewhere, unspecified severity, with anxiety: Secondary | ICD-10-CM | POA: Diagnosis not present

## 2022-03-27 DIAGNOSIS — R296 Repeated falls: Secondary | ICD-10-CM | POA: Diagnosis not present

## 2022-03-27 DIAGNOSIS — Z6820 Body mass index (BMI) 20.0-20.9, adult: Secondary | ICD-10-CM | POA: Diagnosis not present

## 2022-03-27 DIAGNOSIS — F0283 Dementia in other diseases classified elsewhere, unspecified severity, with mood disturbance: Secondary | ICD-10-CM | POA: Diagnosis not present

## 2022-03-28 DIAGNOSIS — F0283 Dementia in other diseases classified elsewhere, unspecified severity, with mood disturbance: Secondary | ICD-10-CM | POA: Diagnosis not present

## 2022-03-28 DIAGNOSIS — Z6820 Body mass index (BMI) 20.0-20.9, adult: Secondary | ICD-10-CM | POA: Diagnosis not present

## 2022-03-28 DIAGNOSIS — E43 Unspecified severe protein-calorie malnutrition: Secondary | ICD-10-CM | POA: Diagnosis not present

## 2022-03-28 DIAGNOSIS — F0284 Dementia in other diseases classified elsewhere, unspecified severity, with anxiety: Secondary | ICD-10-CM | POA: Diagnosis not present

## 2022-03-28 DIAGNOSIS — G311 Senile degeneration of brain, not elsewhere classified: Secondary | ICD-10-CM | POA: Diagnosis not present

## 2022-03-28 DIAGNOSIS — R296 Repeated falls: Secondary | ICD-10-CM | POA: Diagnosis not present

## 2022-03-31 DIAGNOSIS — F0283 Dementia in other diseases classified elsewhere, unspecified severity, with mood disturbance: Secondary | ICD-10-CM | POA: Diagnosis not present

## 2022-03-31 DIAGNOSIS — G311 Senile degeneration of brain, not elsewhere classified: Secondary | ICD-10-CM | POA: Diagnosis not present

## 2022-03-31 DIAGNOSIS — Z6820 Body mass index (BMI) 20.0-20.9, adult: Secondary | ICD-10-CM | POA: Diagnosis not present

## 2022-03-31 DIAGNOSIS — F0284 Dementia in other diseases classified elsewhere, unspecified severity, with anxiety: Secondary | ICD-10-CM | POA: Diagnosis not present

## 2022-03-31 DIAGNOSIS — E43 Unspecified severe protein-calorie malnutrition: Secondary | ICD-10-CM | POA: Diagnosis not present

## 2022-03-31 DIAGNOSIS — R296 Repeated falls: Secondary | ICD-10-CM | POA: Diagnosis not present

## 2022-04-02 DIAGNOSIS — F0283 Dementia in other diseases classified elsewhere, unspecified severity, with mood disturbance: Secondary | ICD-10-CM | POA: Diagnosis not present

## 2022-04-02 DIAGNOSIS — G311 Senile degeneration of brain, not elsewhere classified: Secondary | ICD-10-CM | POA: Diagnosis not present

## 2022-04-02 DIAGNOSIS — E43 Unspecified severe protein-calorie malnutrition: Secondary | ICD-10-CM | POA: Diagnosis not present

## 2022-04-02 DIAGNOSIS — Z6820 Body mass index (BMI) 20.0-20.9, adult: Secondary | ICD-10-CM | POA: Diagnosis not present

## 2022-04-02 DIAGNOSIS — F0284 Dementia in other diseases classified elsewhere, unspecified severity, with anxiety: Secondary | ICD-10-CM | POA: Diagnosis not present

## 2022-04-02 DIAGNOSIS — R296 Repeated falls: Secondary | ICD-10-CM | POA: Diagnosis not present

## 2022-04-03 DIAGNOSIS — R296 Repeated falls: Secondary | ICD-10-CM | POA: Diagnosis not present

## 2022-04-03 DIAGNOSIS — F0283 Dementia in other diseases classified elsewhere, unspecified severity, with mood disturbance: Secondary | ICD-10-CM | POA: Diagnosis not present

## 2022-04-03 DIAGNOSIS — Z6820 Body mass index (BMI) 20.0-20.9, adult: Secondary | ICD-10-CM | POA: Diagnosis not present

## 2022-04-03 DIAGNOSIS — F0284 Dementia in other diseases classified elsewhere, unspecified severity, with anxiety: Secondary | ICD-10-CM | POA: Diagnosis not present

## 2022-04-03 DIAGNOSIS — E43 Unspecified severe protein-calorie malnutrition: Secondary | ICD-10-CM | POA: Diagnosis not present

## 2022-04-03 DIAGNOSIS — G311 Senile degeneration of brain, not elsewhere classified: Secondary | ICD-10-CM | POA: Diagnosis not present

## 2022-04-04 DIAGNOSIS — R296 Repeated falls: Secondary | ICD-10-CM | POA: Diagnosis not present

## 2022-04-04 DIAGNOSIS — E43 Unspecified severe protein-calorie malnutrition: Secondary | ICD-10-CM | POA: Diagnosis not present

## 2022-04-04 DIAGNOSIS — G311 Senile degeneration of brain, not elsewhere classified: Secondary | ICD-10-CM | POA: Diagnosis not present

## 2022-04-04 DIAGNOSIS — Z6820 Body mass index (BMI) 20.0-20.9, adult: Secondary | ICD-10-CM | POA: Diagnosis not present

## 2022-04-04 DIAGNOSIS — F0283 Dementia in other diseases classified elsewhere, unspecified severity, with mood disturbance: Secondary | ICD-10-CM | POA: Diagnosis not present

## 2022-04-04 DIAGNOSIS — F0284 Dementia in other diseases classified elsewhere, unspecified severity, with anxiety: Secondary | ICD-10-CM | POA: Diagnosis not present

## 2022-04-05 DIAGNOSIS — F329 Major depressive disorder, single episode, unspecified: Secondary | ICD-10-CM | POA: Diagnosis not present

## 2022-04-05 DIAGNOSIS — G4089 Other seizures: Secondary | ICD-10-CM | POA: Diagnosis not present

## 2022-04-05 DIAGNOSIS — I10 Essential (primary) hypertension: Secondary | ICD-10-CM | POA: Diagnosis not present

## 2022-04-05 DIAGNOSIS — F0390 Unspecified dementia without behavioral disturbance: Secondary | ICD-10-CM | POA: Diagnosis not present

## 2022-04-05 DIAGNOSIS — I480 Paroxysmal atrial fibrillation: Secondary | ICD-10-CM | POA: Diagnosis not present

## 2022-04-07 DIAGNOSIS — R296 Repeated falls: Secondary | ICD-10-CM | POA: Diagnosis not present

## 2022-04-07 DIAGNOSIS — G311 Senile degeneration of brain, not elsewhere classified: Secondary | ICD-10-CM | POA: Diagnosis not present

## 2022-04-07 DIAGNOSIS — Z6820 Body mass index (BMI) 20.0-20.9, adult: Secondary | ICD-10-CM | POA: Diagnosis not present

## 2022-04-07 DIAGNOSIS — F0284 Dementia in other diseases classified elsewhere, unspecified severity, with anxiety: Secondary | ICD-10-CM | POA: Diagnosis not present

## 2022-04-07 DIAGNOSIS — E43 Unspecified severe protein-calorie malnutrition: Secondary | ICD-10-CM | POA: Diagnosis not present

## 2022-04-07 DIAGNOSIS — F0283 Dementia in other diseases classified elsewhere, unspecified severity, with mood disturbance: Secondary | ICD-10-CM | POA: Diagnosis not present

## 2022-04-09 DIAGNOSIS — E43 Unspecified severe protein-calorie malnutrition: Secondary | ICD-10-CM | POA: Diagnosis not present

## 2022-04-09 DIAGNOSIS — F0284 Dementia in other diseases classified elsewhere, unspecified severity, with anxiety: Secondary | ICD-10-CM | POA: Diagnosis not present

## 2022-04-09 DIAGNOSIS — F329 Major depressive disorder, single episode, unspecified: Secondary | ICD-10-CM | POA: Diagnosis not present

## 2022-04-09 DIAGNOSIS — G311 Senile degeneration of brain, not elsewhere classified: Secondary | ICD-10-CM | POA: Diagnosis not present

## 2022-04-09 DIAGNOSIS — Z6820 Body mass index (BMI) 20.0-20.9, adult: Secondary | ICD-10-CM | POA: Diagnosis not present

## 2022-04-09 DIAGNOSIS — F0283 Dementia in other diseases classified elsewhere, unspecified severity, with mood disturbance: Secondary | ICD-10-CM | POA: Diagnosis not present

## 2022-04-09 DIAGNOSIS — R296 Repeated falls: Secondary | ICD-10-CM | POA: Diagnosis not present

## 2022-04-09 DIAGNOSIS — I10 Essential (primary) hypertension: Secondary | ICD-10-CM | POA: Diagnosis not present

## 2022-04-09 DIAGNOSIS — I480 Paroxysmal atrial fibrillation: Secondary | ICD-10-CM | POA: Diagnosis not present

## 2022-04-09 DIAGNOSIS — F0390 Unspecified dementia without behavioral disturbance: Secondary | ICD-10-CM | POA: Diagnosis not present

## 2022-04-09 DIAGNOSIS — K219 Gastro-esophageal reflux disease without esophagitis: Secondary | ICD-10-CM | POA: Diagnosis not present

## 2022-04-10 DIAGNOSIS — F0284 Dementia in other diseases classified elsewhere, unspecified severity, with anxiety: Secondary | ICD-10-CM | POA: Diagnosis not present

## 2022-04-10 DIAGNOSIS — Z8719 Personal history of other diseases of the digestive system: Secondary | ICD-10-CM | POA: Diagnosis not present

## 2022-04-10 DIAGNOSIS — D631 Anemia in chronic kidney disease: Secondary | ICD-10-CM | POA: Diagnosis not present

## 2022-04-10 DIAGNOSIS — L299 Pruritus, unspecified: Secondary | ICD-10-CM | POA: Diagnosis not present

## 2022-04-10 DIAGNOSIS — M810 Age-related osteoporosis without current pathological fracture: Secondary | ICD-10-CM | POA: Diagnosis not present

## 2022-04-10 DIAGNOSIS — I4891 Unspecified atrial fibrillation: Secondary | ICD-10-CM | POA: Diagnosis not present

## 2022-04-10 DIAGNOSIS — E785 Hyperlipidemia, unspecified: Secondary | ICD-10-CM | POA: Diagnosis not present

## 2022-04-10 DIAGNOSIS — H409 Unspecified glaucoma: Secondary | ICD-10-CM | POA: Diagnosis not present

## 2022-04-10 DIAGNOSIS — F0283 Dementia in other diseases classified elsewhere, unspecified severity, with mood disturbance: Secondary | ICD-10-CM | POA: Diagnosis not present

## 2022-04-10 DIAGNOSIS — N1831 Chronic kidney disease, stage 3a: Secondary | ICD-10-CM | POA: Diagnosis not present

## 2022-04-10 DIAGNOSIS — I129 Hypertensive chronic kidney disease with stage 1 through stage 4 chronic kidney disease, or unspecified chronic kidney disease: Secondary | ICD-10-CM | POA: Diagnosis not present

## 2022-04-10 DIAGNOSIS — E538 Deficiency of other specified B group vitamins: Secondary | ICD-10-CM | POA: Diagnosis not present

## 2022-04-10 DIAGNOSIS — Z87440 Personal history of urinary (tract) infections: Secondary | ICD-10-CM | POA: Diagnosis not present

## 2022-04-10 DIAGNOSIS — Z6820 Body mass index (BMI) 20.0-20.9, adult: Secondary | ICD-10-CM | POA: Diagnosis not present

## 2022-04-10 DIAGNOSIS — G40909 Epilepsy, unspecified, not intractable, without status epilepticus: Secondary | ICD-10-CM | POA: Diagnosis not present

## 2022-04-10 DIAGNOSIS — G311 Senile degeneration of brain, not elsewhere classified: Secondary | ICD-10-CM | POA: Diagnosis not present

## 2022-04-10 DIAGNOSIS — E43 Unspecified severe protein-calorie malnutrition: Secondary | ICD-10-CM | POA: Diagnosis not present

## 2022-04-10 DIAGNOSIS — K219 Gastro-esophageal reflux disease without esophagitis: Secondary | ICD-10-CM | POA: Diagnosis not present

## 2022-04-10 DIAGNOSIS — R296 Repeated falls: Secondary | ICD-10-CM | POA: Diagnosis not present

## 2022-04-11 DIAGNOSIS — R296 Repeated falls: Secondary | ICD-10-CM | POA: Diagnosis not present

## 2022-04-11 DIAGNOSIS — G311 Senile degeneration of brain, not elsewhere classified: Secondary | ICD-10-CM | POA: Diagnosis not present

## 2022-04-11 DIAGNOSIS — E43 Unspecified severe protein-calorie malnutrition: Secondary | ICD-10-CM | POA: Diagnosis not present

## 2022-04-11 DIAGNOSIS — Z6820 Body mass index (BMI) 20.0-20.9, adult: Secondary | ICD-10-CM | POA: Diagnosis not present

## 2022-04-11 DIAGNOSIS — F0283 Dementia in other diseases classified elsewhere, unspecified severity, with mood disturbance: Secondary | ICD-10-CM | POA: Diagnosis not present

## 2022-04-11 DIAGNOSIS — F0284 Dementia in other diseases classified elsewhere, unspecified severity, with anxiety: Secondary | ICD-10-CM | POA: Diagnosis not present

## 2022-04-14 DIAGNOSIS — E43 Unspecified severe protein-calorie malnutrition: Secondary | ICD-10-CM | POA: Diagnosis not present

## 2022-04-14 DIAGNOSIS — Z6820 Body mass index (BMI) 20.0-20.9, adult: Secondary | ICD-10-CM | POA: Diagnosis not present

## 2022-04-14 DIAGNOSIS — F0283 Dementia in other diseases classified elsewhere, unspecified severity, with mood disturbance: Secondary | ICD-10-CM | POA: Diagnosis not present

## 2022-04-14 DIAGNOSIS — G311 Senile degeneration of brain, not elsewhere classified: Secondary | ICD-10-CM | POA: Diagnosis not present

## 2022-04-14 DIAGNOSIS — R296 Repeated falls: Secondary | ICD-10-CM | POA: Diagnosis not present

## 2022-04-14 DIAGNOSIS — F0284 Dementia in other diseases classified elsewhere, unspecified severity, with anxiety: Secondary | ICD-10-CM | POA: Diagnosis not present

## 2022-04-16 DIAGNOSIS — E43 Unspecified severe protein-calorie malnutrition: Secondary | ICD-10-CM | POA: Diagnosis not present

## 2022-04-16 DIAGNOSIS — F0283 Dementia in other diseases classified elsewhere, unspecified severity, with mood disturbance: Secondary | ICD-10-CM | POA: Diagnosis not present

## 2022-04-16 DIAGNOSIS — F0284 Dementia in other diseases classified elsewhere, unspecified severity, with anxiety: Secondary | ICD-10-CM | POA: Diagnosis not present

## 2022-04-16 DIAGNOSIS — R296 Repeated falls: Secondary | ICD-10-CM | POA: Diagnosis not present

## 2022-04-16 DIAGNOSIS — G311 Senile degeneration of brain, not elsewhere classified: Secondary | ICD-10-CM | POA: Diagnosis not present

## 2022-04-16 DIAGNOSIS — Z6820 Body mass index (BMI) 20.0-20.9, adult: Secondary | ICD-10-CM | POA: Diagnosis not present

## 2022-04-17 DIAGNOSIS — F0284 Dementia in other diseases classified elsewhere, unspecified severity, with anxiety: Secondary | ICD-10-CM | POA: Diagnosis not present

## 2022-04-17 DIAGNOSIS — E43 Unspecified severe protein-calorie malnutrition: Secondary | ICD-10-CM | POA: Diagnosis not present

## 2022-04-17 DIAGNOSIS — F0283 Dementia in other diseases classified elsewhere, unspecified severity, with mood disturbance: Secondary | ICD-10-CM | POA: Diagnosis not present

## 2022-04-17 DIAGNOSIS — G311 Senile degeneration of brain, not elsewhere classified: Secondary | ICD-10-CM | POA: Diagnosis not present

## 2022-04-17 DIAGNOSIS — Z6820 Body mass index (BMI) 20.0-20.9, adult: Secondary | ICD-10-CM | POA: Diagnosis not present

## 2022-04-17 DIAGNOSIS — R296 Repeated falls: Secondary | ICD-10-CM | POA: Diagnosis not present

## 2022-04-18 DIAGNOSIS — F0283 Dementia in other diseases classified elsewhere, unspecified severity, with mood disturbance: Secondary | ICD-10-CM | POA: Diagnosis not present

## 2022-04-18 DIAGNOSIS — F0284 Dementia in other diseases classified elsewhere, unspecified severity, with anxiety: Secondary | ICD-10-CM | POA: Diagnosis not present

## 2022-04-18 DIAGNOSIS — R296 Repeated falls: Secondary | ICD-10-CM | POA: Diagnosis not present

## 2022-04-18 DIAGNOSIS — E43 Unspecified severe protein-calorie malnutrition: Secondary | ICD-10-CM | POA: Diagnosis not present

## 2022-04-18 DIAGNOSIS — G311 Senile degeneration of brain, not elsewhere classified: Secondary | ICD-10-CM | POA: Diagnosis not present

## 2022-04-18 DIAGNOSIS — Z6820 Body mass index (BMI) 20.0-20.9, adult: Secondary | ICD-10-CM | POA: Diagnosis not present

## 2022-04-21 DIAGNOSIS — F0284 Dementia in other diseases classified elsewhere, unspecified severity, with anxiety: Secondary | ICD-10-CM | POA: Diagnosis not present

## 2022-04-21 DIAGNOSIS — E43 Unspecified severe protein-calorie malnutrition: Secondary | ICD-10-CM | POA: Diagnosis not present

## 2022-04-21 DIAGNOSIS — F0283 Dementia in other diseases classified elsewhere, unspecified severity, with mood disturbance: Secondary | ICD-10-CM | POA: Diagnosis not present

## 2022-04-21 DIAGNOSIS — R296 Repeated falls: Secondary | ICD-10-CM | POA: Diagnosis not present

## 2022-04-21 DIAGNOSIS — G311 Senile degeneration of brain, not elsewhere classified: Secondary | ICD-10-CM | POA: Diagnosis not present

## 2022-04-21 DIAGNOSIS — Z6820 Body mass index (BMI) 20.0-20.9, adult: Secondary | ICD-10-CM | POA: Diagnosis not present

## 2022-04-23 DIAGNOSIS — F0390 Unspecified dementia without behavioral disturbance: Secondary | ICD-10-CM | POA: Diagnosis not present

## 2022-04-23 DIAGNOSIS — G9340 Encephalopathy, unspecified: Secondary | ICD-10-CM | POA: Diagnosis not present

## 2022-04-23 DIAGNOSIS — R296 Repeated falls: Secondary | ICD-10-CM | POA: Diagnosis not present

## 2022-04-23 DIAGNOSIS — G40909 Epilepsy, unspecified, not intractable, without status epilepticus: Secondary | ICD-10-CM | POA: Diagnosis not present

## 2022-04-23 DIAGNOSIS — G311 Senile degeneration of brain, not elsewhere classified: Secondary | ICD-10-CM | POA: Diagnosis not present

## 2022-04-23 DIAGNOSIS — Z6820 Body mass index (BMI) 20.0-20.9, adult: Secondary | ICD-10-CM | POA: Diagnosis not present

## 2022-04-23 DIAGNOSIS — E43 Unspecified severe protein-calorie malnutrition: Secondary | ICD-10-CM | POA: Diagnosis not present

## 2022-04-23 DIAGNOSIS — D649 Anemia, unspecified: Secondary | ICD-10-CM | POA: Diagnosis not present

## 2022-04-23 DIAGNOSIS — F0284 Dementia in other diseases classified elsewhere, unspecified severity, with anxiety: Secondary | ICD-10-CM | POA: Diagnosis not present

## 2022-04-23 DIAGNOSIS — F0283 Dementia in other diseases classified elsewhere, unspecified severity, with mood disturbance: Secondary | ICD-10-CM | POA: Diagnosis not present

## 2022-04-24 DIAGNOSIS — R296 Repeated falls: Secondary | ICD-10-CM | POA: Diagnosis not present

## 2022-04-24 DIAGNOSIS — G311 Senile degeneration of brain, not elsewhere classified: Secondary | ICD-10-CM | POA: Diagnosis not present

## 2022-04-24 DIAGNOSIS — F0284 Dementia in other diseases classified elsewhere, unspecified severity, with anxiety: Secondary | ICD-10-CM | POA: Diagnosis not present

## 2022-04-24 DIAGNOSIS — F0283 Dementia in other diseases classified elsewhere, unspecified severity, with mood disturbance: Secondary | ICD-10-CM | POA: Diagnosis not present

## 2022-04-24 DIAGNOSIS — Z6820 Body mass index (BMI) 20.0-20.9, adult: Secondary | ICD-10-CM | POA: Diagnosis not present

## 2022-04-24 DIAGNOSIS — E43 Unspecified severe protein-calorie malnutrition: Secondary | ICD-10-CM | POA: Diagnosis not present

## 2022-04-25 DIAGNOSIS — G311 Senile degeneration of brain, not elsewhere classified: Secondary | ICD-10-CM | POA: Diagnosis not present

## 2022-04-25 DIAGNOSIS — F0283 Dementia in other diseases classified elsewhere, unspecified severity, with mood disturbance: Secondary | ICD-10-CM | POA: Diagnosis not present

## 2022-04-25 DIAGNOSIS — Z6820 Body mass index (BMI) 20.0-20.9, adult: Secondary | ICD-10-CM | POA: Diagnosis not present

## 2022-04-25 DIAGNOSIS — F0284 Dementia in other diseases classified elsewhere, unspecified severity, with anxiety: Secondary | ICD-10-CM | POA: Diagnosis not present

## 2022-04-25 DIAGNOSIS — E43 Unspecified severe protein-calorie malnutrition: Secondary | ICD-10-CM | POA: Diagnosis not present

## 2022-04-25 DIAGNOSIS — R296 Repeated falls: Secondary | ICD-10-CM | POA: Diagnosis not present

## 2022-04-27 DIAGNOSIS — Z6820 Body mass index (BMI) 20.0-20.9, adult: Secondary | ICD-10-CM | POA: Diagnosis not present

## 2022-04-27 DIAGNOSIS — F0284 Dementia in other diseases classified elsewhere, unspecified severity, with anxiety: Secondary | ICD-10-CM | POA: Diagnosis not present

## 2022-04-27 DIAGNOSIS — R296 Repeated falls: Secondary | ICD-10-CM | POA: Diagnosis not present

## 2022-04-27 DIAGNOSIS — E43 Unspecified severe protein-calorie malnutrition: Secondary | ICD-10-CM | POA: Diagnosis not present

## 2022-04-27 DIAGNOSIS — G311 Senile degeneration of brain, not elsewhere classified: Secondary | ICD-10-CM | POA: Diagnosis not present

## 2022-04-27 DIAGNOSIS — F0283 Dementia in other diseases classified elsewhere, unspecified severity, with mood disturbance: Secondary | ICD-10-CM | POA: Diagnosis not present

## 2022-04-29 DIAGNOSIS — E43 Unspecified severe protein-calorie malnutrition: Secondary | ICD-10-CM | POA: Diagnosis not present

## 2022-04-29 DIAGNOSIS — F0283 Dementia in other diseases classified elsewhere, unspecified severity, with mood disturbance: Secondary | ICD-10-CM | POA: Diagnosis not present

## 2022-04-29 DIAGNOSIS — F0284 Dementia in other diseases classified elsewhere, unspecified severity, with anxiety: Secondary | ICD-10-CM | POA: Diagnosis not present

## 2022-04-29 DIAGNOSIS — Z6820 Body mass index (BMI) 20.0-20.9, adult: Secondary | ICD-10-CM | POA: Diagnosis not present

## 2022-04-29 DIAGNOSIS — R296 Repeated falls: Secondary | ICD-10-CM | POA: Diagnosis not present

## 2022-04-29 DIAGNOSIS — G311 Senile degeneration of brain, not elsewhere classified: Secondary | ICD-10-CM | POA: Diagnosis not present

## 2022-04-30 DIAGNOSIS — F0284 Dementia in other diseases classified elsewhere, unspecified severity, with anxiety: Secondary | ICD-10-CM | POA: Diagnosis not present

## 2022-04-30 DIAGNOSIS — F0283 Dementia in other diseases classified elsewhere, unspecified severity, with mood disturbance: Secondary | ICD-10-CM | POA: Diagnosis not present

## 2022-04-30 DIAGNOSIS — G311 Senile degeneration of brain, not elsewhere classified: Secondary | ICD-10-CM | POA: Diagnosis not present

## 2022-04-30 DIAGNOSIS — R296 Repeated falls: Secondary | ICD-10-CM | POA: Diagnosis not present

## 2022-04-30 DIAGNOSIS — Z6820 Body mass index (BMI) 20.0-20.9, adult: Secondary | ICD-10-CM | POA: Diagnosis not present

## 2022-04-30 DIAGNOSIS — E43 Unspecified severe protein-calorie malnutrition: Secondary | ICD-10-CM | POA: Diagnosis not present

## 2022-05-01 DIAGNOSIS — R296 Repeated falls: Secondary | ICD-10-CM | POA: Diagnosis not present

## 2022-05-01 DIAGNOSIS — G311 Senile degeneration of brain, not elsewhere classified: Secondary | ICD-10-CM | POA: Diagnosis not present

## 2022-05-01 DIAGNOSIS — F0284 Dementia in other diseases classified elsewhere, unspecified severity, with anxiety: Secondary | ICD-10-CM | POA: Diagnosis not present

## 2022-05-01 DIAGNOSIS — E43 Unspecified severe protein-calorie malnutrition: Secondary | ICD-10-CM | POA: Diagnosis not present

## 2022-05-01 DIAGNOSIS — F0283 Dementia in other diseases classified elsewhere, unspecified severity, with mood disturbance: Secondary | ICD-10-CM | POA: Diagnosis not present

## 2022-05-01 DIAGNOSIS — Z6820 Body mass index (BMI) 20.0-20.9, adult: Secondary | ICD-10-CM | POA: Diagnosis not present

## 2022-05-05 DIAGNOSIS — E43 Unspecified severe protein-calorie malnutrition: Secondary | ICD-10-CM | POA: Diagnosis not present

## 2022-05-05 DIAGNOSIS — F0284 Dementia in other diseases classified elsewhere, unspecified severity, with anxiety: Secondary | ICD-10-CM | POA: Diagnosis not present

## 2022-05-05 DIAGNOSIS — Z6820 Body mass index (BMI) 20.0-20.9, adult: Secondary | ICD-10-CM | POA: Diagnosis not present

## 2022-05-05 DIAGNOSIS — G311 Senile degeneration of brain, not elsewhere classified: Secondary | ICD-10-CM | POA: Diagnosis not present

## 2022-05-05 DIAGNOSIS — R296 Repeated falls: Secondary | ICD-10-CM | POA: Diagnosis not present

## 2022-05-05 DIAGNOSIS — F0283 Dementia in other diseases classified elsewhere, unspecified severity, with mood disturbance: Secondary | ICD-10-CM | POA: Diagnosis not present

## 2022-05-08 DIAGNOSIS — F0284 Dementia in other diseases classified elsewhere, unspecified severity, with anxiety: Secondary | ICD-10-CM | POA: Diagnosis not present

## 2022-05-08 DIAGNOSIS — F0283 Dementia in other diseases classified elsewhere, unspecified severity, with mood disturbance: Secondary | ICD-10-CM | POA: Diagnosis not present

## 2022-05-08 DIAGNOSIS — Z6820 Body mass index (BMI) 20.0-20.9, adult: Secondary | ICD-10-CM | POA: Diagnosis not present

## 2022-05-08 DIAGNOSIS — E43 Unspecified severe protein-calorie malnutrition: Secondary | ICD-10-CM | POA: Diagnosis not present

## 2022-05-08 DIAGNOSIS — G311 Senile degeneration of brain, not elsewhere classified: Secondary | ICD-10-CM | POA: Diagnosis not present

## 2022-05-08 DIAGNOSIS — R296 Repeated falls: Secondary | ICD-10-CM | POA: Diagnosis not present

## 2022-05-10 DIAGNOSIS — E785 Hyperlipidemia, unspecified: Secondary | ICD-10-CM | POA: Diagnosis not present

## 2022-05-10 DIAGNOSIS — F0284 Dementia in other diseases classified elsewhere, unspecified severity, with anxiety: Secondary | ICD-10-CM | POA: Diagnosis not present

## 2022-05-10 DIAGNOSIS — Z87440 Personal history of urinary (tract) infections: Secondary | ICD-10-CM | POA: Diagnosis not present

## 2022-05-10 DIAGNOSIS — Z8719 Personal history of other diseases of the digestive system: Secondary | ICD-10-CM | POA: Diagnosis not present

## 2022-05-10 DIAGNOSIS — Z6820 Body mass index (BMI) 20.0-20.9, adult: Secondary | ICD-10-CM | POA: Diagnosis not present

## 2022-05-10 DIAGNOSIS — I129 Hypertensive chronic kidney disease with stage 1 through stage 4 chronic kidney disease, or unspecified chronic kidney disease: Secondary | ICD-10-CM | POA: Diagnosis not present

## 2022-05-10 DIAGNOSIS — E43 Unspecified severe protein-calorie malnutrition: Secondary | ICD-10-CM | POA: Diagnosis not present

## 2022-05-10 DIAGNOSIS — N1831 Chronic kidney disease, stage 3a: Secondary | ICD-10-CM | POA: Diagnosis not present

## 2022-05-10 DIAGNOSIS — L299 Pruritus, unspecified: Secondary | ICD-10-CM | POA: Diagnosis not present

## 2022-05-10 DIAGNOSIS — G40909 Epilepsy, unspecified, not intractable, without status epilepticus: Secondary | ICD-10-CM | POA: Diagnosis not present

## 2022-05-10 DIAGNOSIS — I4891 Unspecified atrial fibrillation: Secondary | ICD-10-CM | POA: Diagnosis not present

## 2022-05-10 DIAGNOSIS — F0283 Dementia in other diseases classified elsewhere, unspecified severity, with mood disturbance: Secondary | ICD-10-CM | POA: Diagnosis not present

## 2022-05-10 DIAGNOSIS — R296 Repeated falls: Secondary | ICD-10-CM | POA: Diagnosis not present

## 2022-05-10 DIAGNOSIS — D631 Anemia in chronic kidney disease: Secondary | ICD-10-CM | POA: Diagnosis not present

## 2022-05-10 DIAGNOSIS — E538 Deficiency of other specified B group vitamins: Secondary | ICD-10-CM | POA: Diagnosis not present

## 2022-05-10 DIAGNOSIS — H409 Unspecified glaucoma: Secondary | ICD-10-CM | POA: Diagnosis not present

## 2022-05-10 DIAGNOSIS — G311 Senile degeneration of brain, not elsewhere classified: Secondary | ICD-10-CM | POA: Diagnosis not present

## 2022-05-10 DIAGNOSIS — R159 Full incontinence of feces: Secondary | ICD-10-CM | POA: Diagnosis not present

## 2022-05-10 DIAGNOSIS — Z741 Need for assistance with personal care: Secondary | ICD-10-CM | POA: Diagnosis not present

## 2022-05-10 DIAGNOSIS — R32 Unspecified urinary incontinence: Secondary | ICD-10-CM | POA: Diagnosis not present

## 2022-05-10 DIAGNOSIS — M810 Age-related osteoporosis without current pathological fracture: Secondary | ICD-10-CM | POA: Diagnosis not present

## 2022-05-10 DIAGNOSIS — K219 Gastro-esophageal reflux disease without esophagitis: Secondary | ICD-10-CM | POA: Diagnosis not present

## 2022-05-12 DIAGNOSIS — F0283 Dementia in other diseases classified elsewhere, unspecified severity, with mood disturbance: Secondary | ICD-10-CM | POA: Diagnosis not present

## 2022-05-12 DIAGNOSIS — G311 Senile degeneration of brain, not elsewhere classified: Secondary | ICD-10-CM | POA: Diagnosis not present

## 2022-05-12 DIAGNOSIS — Z6820 Body mass index (BMI) 20.0-20.9, adult: Secondary | ICD-10-CM | POA: Diagnosis not present

## 2022-05-12 DIAGNOSIS — R296 Repeated falls: Secondary | ICD-10-CM | POA: Diagnosis not present

## 2022-05-12 DIAGNOSIS — F0284 Dementia in other diseases classified elsewhere, unspecified severity, with anxiety: Secondary | ICD-10-CM | POA: Diagnosis not present

## 2022-05-12 DIAGNOSIS — E43 Unspecified severe protein-calorie malnutrition: Secondary | ICD-10-CM | POA: Diagnosis not present

## 2022-05-14 DIAGNOSIS — E43 Unspecified severe protein-calorie malnutrition: Secondary | ICD-10-CM | POA: Diagnosis not present

## 2022-05-14 DIAGNOSIS — F0283 Dementia in other diseases classified elsewhere, unspecified severity, with mood disturbance: Secondary | ICD-10-CM | POA: Diagnosis not present

## 2022-05-14 DIAGNOSIS — Z6820 Body mass index (BMI) 20.0-20.9, adult: Secondary | ICD-10-CM | POA: Diagnosis not present

## 2022-05-14 DIAGNOSIS — F0284 Dementia in other diseases classified elsewhere, unspecified severity, with anxiety: Secondary | ICD-10-CM | POA: Diagnosis not present

## 2022-05-14 DIAGNOSIS — R296 Repeated falls: Secondary | ICD-10-CM | POA: Diagnosis not present

## 2022-05-14 DIAGNOSIS — G311 Senile degeneration of brain, not elsewhere classified: Secondary | ICD-10-CM | POA: Diagnosis not present

## 2022-05-15 DIAGNOSIS — E43 Unspecified severe protein-calorie malnutrition: Secondary | ICD-10-CM | POA: Diagnosis not present

## 2022-05-15 DIAGNOSIS — Z6820 Body mass index (BMI) 20.0-20.9, adult: Secondary | ICD-10-CM | POA: Diagnosis not present

## 2022-05-15 DIAGNOSIS — G311 Senile degeneration of brain, not elsewhere classified: Secondary | ICD-10-CM | POA: Diagnosis not present

## 2022-05-15 DIAGNOSIS — F0283 Dementia in other diseases classified elsewhere, unspecified severity, with mood disturbance: Secondary | ICD-10-CM | POA: Diagnosis not present

## 2022-05-15 DIAGNOSIS — F0284 Dementia in other diseases classified elsewhere, unspecified severity, with anxiety: Secondary | ICD-10-CM | POA: Diagnosis not present

## 2022-05-15 DIAGNOSIS — R296 Repeated falls: Secondary | ICD-10-CM | POA: Diagnosis not present

## 2022-05-17 DIAGNOSIS — F0390 Unspecified dementia without behavioral disturbance: Secondary | ICD-10-CM | POA: Diagnosis not present

## 2022-05-17 DIAGNOSIS — H409 Unspecified glaucoma: Secondary | ICD-10-CM | POA: Diagnosis not present

## 2022-05-17 DIAGNOSIS — F329 Major depressive disorder, single episode, unspecified: Secondary | ICD-10-CM | POA: Diagnosis not present

## 2022-05-17 DIAGNOSIS — K219 Gastro-esophageal reflux disease without esophagitis: Secondary | ICD-10-CM | POA: Diagnosis not present

## 2022-05-17 DIAGNOSIS — I480 Paroxysmal atrial fibrillation: Secondary | ICD-10-CM | POA: Diagnosis not present

## 2022-05-17 DIAGNOSIS — G4089 Other seizures: Secondary | ICD-10-CM | POA: Diagnosis not present

## 2022-05-17 DIAGNOSIS — M150 Primary generalized (osteo)arthritis: Secondary | ICD-10-CM | POA: Diagnosis not present

## 2022-05-17 DIAGNOSIS — K5909 Other constipation: Secondary | ICD-10-CM | POA: Diagnosis not present

## 2022-05-19 DIAGNOSIS — G311 Senile degeneration of brain, not elsewhere classified: Secondary | ICD-10-CM | POA: Diagnosis not present

## 2022-05-19 DIAGNOSIS — Z6820 Body mass index (BMI) 20.0-20.9, adult: Secondary | ICD-10-CM | POA: Diagnosis not present

## 2022-05-19 DIAGNOSIS — F0284 Dementia in other diseases classified elsewhere, unspecified severity, with anxiety: Secondary | ICD-10-CM | POA: Diagnosis not present

## 2022-05-19 DIAGNOSIS — E43 Unspecified severe protein-calorie malnutrition: Secondary | ICD-10-CM | POA: Diagnosis not present

## 2022-05-19 DIAGNOSIS — F0283 Dementia in other diseases classified elsewhere, unspecified severity, with mood disturbance: Secondary | ICD-10-CM | POA: Diagnosis not present

## 2022-05-19 DIAGNOSIS — R296 Repeated falls: Secondary | ICD-10-CM | POA: Diagnosis not present

## 2022-05-21 DIAGNOSIS — F0283 Dementia in other diseases classified elsewhere, unspecified severity, with mood disturbance: Secondary | ICD-10-CM | POA: Diagnosis not present

## 2022-05-21 DIAGNOSIS — E43 Unspecified severe protein-calorie malnutrition: Secondary | ICD-10-CM | POA: Diagnosis not present

## 2022-05-21 DIAGNOSIS — F0284 Dementia in other diseases classified elsewhere, unspecified severity, with anxiety: Secondary | ICD-10-CM | POA: Diagnosis not present

## 2022-05-21 DIAGNOSIS — R296 Repeated falls: Secondary | ICD-10-CM | POA: Diagnosis not present

## 2022-05-21 DIAGNOSIS — G311 Senile degeneration of brain, not elsewhere classified: Secondary | ICD-10-CM | POA: Diagnosis not present

## 2022-05-21 DIAGNOSIS — Z6820 Body mass index (BMI) 20.0-20.9, adult: Secondary | ICD-10-CM | POA: Diagnosis not present

## 2022-05-22 DIAGNOSIS — Z6820 Body mass index (BMI) 20.0-20.9, adult: Secondary | ICD-10-CM | POA: Diagnosis not present

## 2022-05-22 DIAGNOSIS — R296 Repeated falls: Secondary | ICD-10-CM | POA: Diagnosis not present

## 2022-05-22 DIAGNOSIS — E43 Unspecified severe protein-calorie malnutrition: Secondary | ICD-10-CM | POA: Diagnosis not present

## 2022-05-22 DIAGNOSIS — F0284 Dementia in other diseases classified elsewhere, unspecified severity, with anxiety: Secondary | ICD-10-CM | POA: Diagnosis not present

## 2022-05-22 DIAGNOSIS — G311 Senile degeneration of brain, not elsewhere classified: Secondary | ICD-10-CM | POA: Diagnosis not present

## 2022-05-22 DIAGNOSIS — F0283 Dementia in other diseases classified elsewhere, unspecified severity, with mood disturbance: Secondary | ICD-10-CM | POA: Diagnosis not present

## 2022-05-23 DIAGNOSIS — Z6820 Body mass index (BMI) 20.0-20.9, adult: Secondary | ICD-10-CM | POA: Diagnosis not present

## 2022-05-23 DIAGNOSIS — R21 Rash and other nonspecific skin eruption: Secondary | ICD-10-CM | POA: Diagnosis not present

## 2022-05-23 DIAGNOSIS — F0283 Dementia in other diseases classified elsewhere, unspecified severity, with mood disturbance: Secondary | ICD-10-CM | POA: Diagnosis not present

## 2022-05-23 DIAGNOSIS — G311 Senile degeneration of brain, not elsewhere classified: Secondary | ICD-10-CM | POA: Diagnosis not present

## 2022-05-23 DIAGNOSIS — I10 Essential (primary) hypertension: Secondary | ICD-10-CM | POA: Diagnosis not present

## 2022-05-23 DIAGNOSIS — R296 Repeated falls: Secondary | ICD-10-CM | POA: Diagnosis not present

## 2022-05-23 DIAGNOSIS — F0284 Dementia in other diseases classified elsewhere, unspecified severity, with anxiety: Secondary | ICD-10-CM | POA: Diagnosis not present

## 2022-05-23 DIAGNOSIS — I480 Paroxysmal atrial fibrillation: Secondary | ICD-10-CM | POA: Diagnosis not present

## 2022-05-23 DIAGNOSIS — F329 Major depressive disorder, single episode, unspecified: Secondary | ICD-10-CM | POA: Diagnosis not present

## 2022-05-23 DIAGNOSIS — F0390 Unspecified dementia without behavioral disturbance: Secondary | ICD-10-CM | POA: Diagnosis not present

## 2022-05-23 DIAGNOSIS — E43 Unspecified severe protein-calorie malnutrition: Secondary | ICD-10-CM | POA: Diagnosis not present

## 2022-05-26 DIAGNOSIS — R296 Repeated falls: Secondary | ICD-10-CM | POA: Diagnosis not present

## 2022-05-26 DIAGNOSIS — Z6820 Body mass index (BMI) 20.0-20.9, adult: Secondary | ICD-10-CM | POA: Diagnosis not present

## 2022-05-26 DIAGNOSIS — G311 Senile degeneration of brain, not elsewhere classified: Secondary | ICD-10-CM | POA: Diagnosis not present

## 2022-05-26 DIAGNOSIS — F0284 Dementia in other diseases classified elsewhere, unspecified severity, with anxiety: Secondary | ICD-10-CM | POA: Diagnosis not present

## 2022-05-26 DIAGNOSIS — F0283 Dementia in other diseases classified elsewhere, unspecified severity, with mood disturbance: Secondary | ICD-10-CM | POA: Diagnosis not present

## 2022-05-26 DIAGNOSIS — E43 Unspecified severe protein-calorie malnutrition: Secondary | ICD-10-CM | POA: Diagnosis not present

## 2022-05-30 DIAGNOSIS — G311 Senile degeneration of brain, not elsewhere classified: Secondary | ICD-10-CM | POA: Diagnosis not present

## 2022-05-30 DIAGNOSIS — R296 Repeated falls: Secondary | ICD-10-CM | POA: Diagnosis not present

## 2022-05-30 DIAGNOSIS — E43 Unspecified severe protein-calorie malnutrition: Secondary | ICD-10-CM | POA: Diagnosis not present

## 2022-05-30 DIAGNOSIS — Z6820 Body mass index (BMI) 20.0-20.9, adult: Secondary | ICD-10-CM | POA: Diagnosis not present

## 2022-05-30 DIAGNOSIS — F0283 Dementia in other diseases classified elsewhere, unspecified severity, with mood disturbance: Secondary | ICD-10-CM | POA: Diagnosis not present

## 2022-05-30 DIAGNOSIS — F0284 Dementia in other diseases classified elsewhere, unspecified severity, with anxiety: Secondary | ICD-10-CM | POA: Diagnosis not present

## 2022-06-02 DIAGNOSIS — Z6820 Body mass index (BMI) 20.0-20.9, adult: Secondary | ICD-10-CM | POA: Diagnosis not present

## 2022-06-02 DIAGNOSIS — F0284 Dementia in other diseases classified elsewhere, unspecified severity, with anxiety: Secondary | ICD-10-CM | POA: Diagnosis not present

## 2022-06-02 DIAGNOSIS — R296 Repeated falls: Secondary | ICD-10-CM | POA: Diagnosis not present

## 2022-06-02 DIAGNOSIS — F0283 Dementia in other diseases classified elsewhere, unspecified severity, with mood disturbance: Secondary | ICD-10-CM | POA: Diagnosis not present

## 2022-06-02 DIAGNOSIS — E43 Unspecified severe protein-calorie malnutrition: Secondary | ICD-10-CM | POA: Diagnosis not present

## 2022-06-02 DIAGNOSIS — G311 Senile degeneration of brain, not elsewhere classified: Secondary | ICD-10-CM | POA: Diagnosis not present

## 2022-06-04 DIAGNOSIS — E43 Unspecified severe protein-calorie malnutrition: Secondary | ICD-10-CM | POA: Diagnosis not present

## 2022-06-04 DIAGNOSIS — F0283 Dementia in other diseases classified elsewhere, unspecified severity, with mood disturbance: Secondary | ICD-10-CM | POA: Diagnosis not present

## 2022-06-04 DIAGNOSIS — F0284 Dementia in other diseases classified elsewhere, unspecified severity, with anxiety: Secondary | ICD-10-CM | POA: Diagnosis not present

## 2022-06-04 DIAGNOSIS — R296 Repeated falls: Secondary | ICD-10-CM | POA: Diagnosis not present

## 2022-06-04 DIAGNOSIS — G311 Senile degeneration of brain, not elsewhere classified: Secondary | ICD-10-CM | POA: Diagnosis not present

## 2022-06-04 DIAGNOSIS — Z6820 Body mass index (BMI) 20.0-20.9, adult: Secondary | ICD-10-CM | POA: Diagnosis not present

## 2022-06-05 DIAGNOSIS — R296 Repeated falls: Secondary | ICD-10-CM | POA: Diagnosis not present

## 2022-06-05 DIAGNOSIS — G311 Senile degeneration of brain, not elsewhere classified: Secondary | ICD-10-CM | POA: Diagnosis not present

## 2022-06-05 DIAGNOSIS — Z6820 Body mass index (BMI) 20.0-20.9, adult: Secondary | ICD-10-CM | POA: Diagnosis not present

## 2022-06-05 DIAGNOSIS — E43 Unspecified severe protein-calorie malnutrition: Secondary | ICD-10-CM | POA: Diagnosis not present

## 2022-06-05 DIAGNOSIS — F0284 Dementia in other diseases classified elsewhere, unspecified severity, with anxiety: Secondary | ICD-10-CM | POA: Diagnosis not present

## 2022-06-05 DIAGNOSIS — F0283 Dementia in other diseases classified elsewhere, unspecified severity, with mood disturbance: Secondary | ICD-10-CM | POA: Diagnosis not present

## 2022-06-07 DIAGNOSIS — M150 Primary generalized (osteo)arthritis: Secondary | ICD-10-CM | POA: Diagnosis not present

## 2022-06-07 DIAGNOSIS — I480 Paroxysmal atrial fibrillation: Secondary | ICD-10-CM | POA: Diagnosis not present

## 2022-06-07 DIAGNOSIS — F0390 Unspecified dementia without behavioral disturbance: Secondary | ICD-10-CM | POA: Diagnosis not present

## 2022-06-07 DIAGNOSIS — G4089 Other seizures: Secondary | ICD-10-CM | POA: Diagnosis not present

## 2022-06-07 DIAGNOSIS — I10 Essential (primary) hypertension: Secondary | ICD-10-CM | POA: Diagnosis not present

## 2022-06-07 DIAGNOSIS — F329 Major depressive disorder, single episode, unspecified: Secondary | ICD-10-CM | POA: Diagnosis not present

## 2022-06-09 DIAGNOSIS — H10021 Other mucopurulent conjunctivitis, right eye: Secondary | ICD-10-CM | POA: Diagnosis not present

## 2022-06-09 DIAGNOSIS — I480 Paroxysmal atrial fibrillation: Secondary | ICD-10-CM | POA: Diagnosis not present

## 2022-06-09 DIAGNOSIS — F329 Major depressive disorder, single episode, unspecified: Secondary | ICD-10-CM | POA: Diagnosis not present

## 2022-06-09 DIAGNOSIS — Z6820 Body mass index (BMI) 20.0-20.9, adult: Secondary | ICD-10-CM | POA: Diagnosis not present

## 2022-06-09 DIAGNOSIS — F0390 Unspecified dementia without behavioral disturbance: Secondary | ICD-10-CM | POA: Diagnosis not present

## 2022-06-09 DIAGNOSIS — F0284 Dementia in other diseases classified elsewhere, unspecified severity, with anxiety: Secondary | ICD-10-CM | POA: Diagnosis not present

## 2022-06-09 DIAGNOSIS — G311 Senile degeneration of brain, not elsewhere classified: Secondary | ICD-10-CM | POA: Diagnosis not present

## 2022-06-09 DIAGNOSIS — R296 Repeated falls: Secondary | ICD-10-CM | POA: Diagnosis not present

## 2022-06-09 DIAGNOSIS — E43 Unspecified severe protein-calorie malnutrition: Secondary | ICD-10-CM | POA: Diagnosis not present

## 2022-06-09 DIAGNOSIS — F0283 Dementia in other diseases classified elsewhere, unspecified severity, with mood disturbance: Secondary | ICD-10-CM | POA: Diagnosis not present

## 2022-06-09 DIAGNOSIS — I10 Essential (primary) hypertension: Secondary | ICD-10-CM | POA: Diagnosis not present

## 2022-06-10 DIAGNOSIS — E785 Hyperlipidemia, unspecified: Secondary | ICD-10-CM | POA: Diagnosis not present

## 2022-06-10 DIAGNOSIS — I129 Hypertensive chronic kidney disease with stage 1 through stage 4 chronic kidney disease, or unspecified chronic kidney disease: Secondary | ICD-10-CM | POA: Diagnosis not present

## 2022-06-10 DIAGNOSIS — D631 Anemia in chronic kidney disease: Secondary | ICD-10-CM | POA: Diagnosis not present

## 2022-06-10 DIAGNOSIS — G311 Senile degeneration of brain, not elsewhere classified: Secondary | ICD-10-CM | POA: Diagnosis not present

## 2022-06-10 DIAGNOSIS — Z6820 Body mass index (BMI) 20.0-20.9, adult: Secondary | ICD-10-CM | POA: Diagnosis not present

## 2022-06-10 DIAGNOSIS — I4891 Unspecified atrial fibrillation: Secondary | ICD-10-CM | POA: Diagnosis not present

## 2022-06-10 DIAGNOSIS — L299 Pruritus, unspecified: Secondary | ICD-10-CM | POA: Diagnosis not present

## 2022-06-10 DIAGNOSIS — G40909 Epilepsy, unspecified, not intractable, without status epilepticus: Secondary | ICD-10-CM | POA: Diagnosis not present

## 2022-06-10 DIAGNOSIS — H409 Unspecified glaucoma: Secondary | ICD-10-CM | POA: Diagnosis not present

## 2022-06-10 DIAGNOSIS — R32 Unspecified urinary incontinence: Secondary | ICD-10-CM | POA: Diagnosis not present

## 2022-06-10 DIAGNOSIS — E538 Deficiency of other specified B group vitamins: Secondary | ICD-10-CM | POA: Diagnosis not present

## 2022-06-10 DIAGNOSIS — K219 Gastro-esophageal reflux disease without esophagitis: Secondary | ICD-10-CM | POA: Diagnosis not present

## 2022-06-10 DIAGNOSIS — R159 Full incontinence of feces: Secondary | ICD-10-CM | POA: Diagnosis not present

## 2022-06-10 DIAGNOSIS — F0283 Dementia in other diseases classified elsewhere, unspecified severity, with mood disturbance: Secondary | ICD-10-CM | POA: Diagnosis not present

## 2022-06-10 DIAGNOSIS — Z8719 Personal history of other diseases of the digestive system: Secondary | ICD-10-CM | POA: Diagnosis not present

## 2022-06-10 DIAGNOSIS — Z87440 Personal history of urinary (tract) infections: Secondary | ICD-10-CM | POA: Diagnosis not present

## 2022-06-10 DIAGNOSIS — Z741 Need for assistance with personal care: Secondary | ICD-10-CM | POA: Diagnosis not present

## 2022-06-10 DIAGNOSIS — R296 Repeated falls: Secondary | ICD-10-CM | POA: Diagnosis not present

## 2022-06-10 DIAGNOSIS — F0284 Dementia in other diseases classified elsewhere, unspecified severity, with anxiety: Secondary | ICD-10-CM | POA: Diagnosis not present

## 2022-06-10 DIAGNOSIS — M810 Age-related osteoporosis without current pathological fracture: Secondary | ICD-10-CM | POA: Diagnosis not present

## 2022-06-10 DIAGNOSIS — E43 Unspecified severe protein-calorie malnutrition: Secondary | ICD-10-CM | POA: Diagnosis not present

## 2022-06-10 DIAGNOSIS — N1831 Chronic kidney disease, stage 3a: Secondary | ICD-10-CM | POA: Diagnosis not present

## 2022-06-11 DIAGNOSIS — F0283 Dementia in other diseases classified elsewhere, unspecified severity, with mood disturbance: Secondary | ICD-10-CM | POA: Diagnosis not present

## 2022-06-11 DIAGNOSIS — F0284 Dementia in other diseases classified elsewhere, unspecified severity, with anxiety: Secondary | ICD-10-CM | POA: Diagnosis not present

## 2022-06-11 DIAGNOSIS — Z6820 Body mass index (BMI) 20.0-20.9, adult: Secondary | ICD-10-CM | POA: Diagnosis not present

## 2022-06-11 DIAGNOSIS — G311 Senile degeneration of brain, not elsewhere classified: Secondary | ICD-10-CM | POA: Diagnosis not present

## 2022-06-11 DIAGNOSIS — E43 Unspecified severe protein-calorie malnutrition: Secondary | ICD-10-CM | POA: Diagnosis not present

## 2022-06-11 DIAGNOSIS — R296 Repeated falls: Secondary | ICD-10-CM | POA: Diagnosis not present

## 2022-06-12 DIAGNOSIS — R296 Repeated falls: Secondary | ICD-10-CM | POA: Diagnosis not present

## 2022-06-12 DIAGNOSIS — I480 Paroxysmal atrial fibrillation: Secondary | ICD-10-CM | POA: Diagnosis not present

## 2022-06-12 DIAGNOSIS — G311 Senile degeneration of brain, not elsewhere classified: Secondary | ICD-10-CM | POA: Diagnosis not present

## 2022-06-12 DIAGNOSIS — F0283 Dementia in other diseases classified elsewhere, unspecified severity, with mood disturbance: Secondary | ICD-10-CM | POA: Diagnosis not present

## 2022-06-12 DIAGNOSIS — K219 Gastro-esophageal reflux disease without esophagitis: Secondary | ICD-10-CM | POA: Diagnosis not present

## 2022-06-12 DIAGNOSIS — E43 Unspecified severe protein-calorie malnutrition: Secondary | ICD-10-CM | POA: Diagnosis not present

## 2022-06-12 DIAGNOSIS — F0284 Dementia in other diseases classified elsewhere, unspecified severity, with anxiety: Secondary | ICD-10-CM | POA: Diagnosis not present

## 2022-06-12 DIAGNOSIS — Z6820 Body mass index (BMI) 20.0-20.9, adult: Secondary | ICD-10-CM | POA: Diagnosis not present

## 2022-06-12 DIAGNOSIS — H10021 Other mucopurulent conjunctivitis, right eye: Secondary | ICD-10-CM | POA: Diagnosis not present

## 2022-06-12 DIAGNOSIS — I10 Essential (primary) hypertension: Secondary | ICD-10-CM | POA: Diagnosis not present

## 2022-06-12 DIAGNOSIS — F0390 Unspecified dementia without behavioral disturbance: Secondary | ICD-10-CM | POA: Diagnosis not present

## 2022-06-15 DIAGNOSIS — E43 Unspecified severe protein-calorie malnutrition: Secondary | ICD-10-CM | POA: Diagnosis not present

## 2022-06-15 DIAGNOSIS — F0283 Dementia in other diseases classified elsewhere, unspecified severity, with mood disturbance: Secondary | ICD-10-CM | POA: Diagnosis not present

## 2022-06-15 DIAGNOSIS — F0284 Dementia in other diseases classified elsewhere, unspecified severity, with anxiety: Secondary | ICD-10-CM | POA: Diagnosis not present

## 2022-06-15 DIAGNOSIS — Z6820 Body mass index (BMI) 20.0-20.9, adult: Secondary | ICD-10-CM | POA: Diagnosis not present

## 2022-06-15 DIAGNOSIS — R296 Repeated falls: Secondary | ICD-10-CM | POA: Diagnosis not present

## 2022-06-15 DIAGNOSIS — G311 Senile degeneration of brain, not elsewhere classified: Secondary | ICD-10-CM | POA: Diagnosis not present

## 2022-06-18 DIAGNOSIS — D649 Anemia, unspecified: Secondary | ICD-10-CM | POA: Diagnosis not present

## 2022-06-18 DIAGNOSIS — F0390 Unspecified dementia without behavioral disturbance: Secondary | ICD-10-CM | POA: Diagnosis not present

## 2022-06-18 DIAGNOSIS — D500 Iron deficiency anemia secondary to blood loss (chronic): Secondary | ICD-10-CM | POA: Diagnosis not present

## 2022-06-18 DIAGNOSIS — G9340 Encephalopathy, unspecified: Secondary | ICD-10-CM | POA: Diagnosis not present

## 2022-06-18 DIAGNOSIS — F0284 Dementia in other diseases classified elsewhere, unspecified severity, with anxiety: Secondary | ICD-10-CM | POA: Diagnosis not present

## 2022-06-18 DIAGNOSIS — G40909 Epilepsy, unspecified, not intractable, without status epilepticus: Secondary | ICD-10-CM | POA: Diagnosis not present

## 2022-06-18 DIAGNOSIS — E43 Unspecified severe protein-calorie malnutrition: Secondary | ICD-10-CM | POA: Diagnosis not present

## 2022-06-18 DIAGNOSIS — Z6820 Body mass index (BMI) 20.0-20.9, adult: Secondary | ICD-10-CM | POA: Diagnosis not present

## 2022-06-18 DIAGNOSIS — R296 Repeated falls: Secondary | ICD-10-CM | POA: Diagnosis not present

## 2022-06-18 DIAGNOSIS — F0283 Dementia in other diseases classified elsewhere, unspecified severity, with mood disturbance: Secondary | ICD-10-CM | POA: Diagnosis not present

## 2022-06-18 DIAGNOSIS — I480 Paroxysmal atrial fibrillation: Secondary | ICD-10-CM | POA: Diagnosis not present

## 2022-06-18 DIAGNOSIS — G311 Senile degeneration of brain, not elsewhere classified: Secondary | ICD-10-CM | POA: Diagnosis not present

## 2022-06-19 DIAGNOSIS — R296 Repeated falls: Secondary | ICD-10-CM | POA: Diagnosis not present

## 2022-06-19 DIAGNOSIS — Z6820 Body mass index (BMI) 20.0-20.9, adult: Secondary | ICD-10-CM | POA: Diagnosis not present

## 2022-06-19 DIAGNOSIS — F0283 Dementia in other diseases classified elsewhere, unspecified severity, with mood disturbance: Secondary | ICD-10-CM | POA: Diagnosis not present

## 2022-06-19 DIAGNOSIS — G311 Senile degeneration of brain, not elsewhere classified: Secondary | ICD-10-CM | POA: Diagnosis not present

## 2022-06-19 DIAGNOSIS — F0284 Dementia in other diseases classified elsewhere, unspecified severity, with anxiety: Secondary | ICD-10-CM | POA: Diagnosis not present

## 2022-06-19 DIAGNOSIS — E43 Unspecified severe protein-calorie malnutrition: Secondary | ICD-10-CM | POA: Diagnosis not present

## 2022-06-20 DIAGNOSIS — M150 Primary generalized (osteo)arthritis: Secondary | ICD-10-CM | POA: Diagnosis not present

## 2022-06-20 DIAGNOSIS — I10 Essential (primary) hypertension: Secondary | ICD-10-CM | POA: Diagnosis not present

## 2022-06-20 DIAGNOSIS — I480 Paroxysmal atrial fibrillation: Secondary | ICD-10-CM | POA: Diagnosis not present

## 2022-06-20 DIAGNOSIS — F329 Major depressive disorder, single episode, unspecified: Secondary | ICD-10-CM | POA: Diagnosis not present

## 2022-06-20 DIAGNOSIS — F0390 Unspecified dementia without behavioral disturbance: Secondary | ICD-10-CM | POA: Diagnosis not present

## 2022-06-22 DIAGNOSIS — R21 Rash and other nonspecific skin eruption: Secondary | ICD-10-CM | POA: Diagnosis not present

## 2022-06-22 DIAGNOSIS — M150 Primary generalized (osteo)arthritis: Secondary | ICD-10-CM | POA: Diagnosis not present

## 2022-06-22 DIAGNOSIS — F329 Major depressive disorder, single episode, unspecified: Secondary | ICD-10-CM | POA: Diagnosis not present

## 2022-06-22 DIAGNOSIS — G4089 Other seizures: Secondary | ICD-10-CM | POA: Diagnosis not present

## 2022-06-22 DIAGNOSIS — I480 Paroxysmal atrial fibrillation: Secondary | ICD-10-CM | POA: Diagnosis not present

## 2022-06-22 DIAGNOSIS — F0390 Unspecified dementia without behavioral disturbance: Secondary | ICD-10-CM | POA: Diagnosis not present

## 2022-06-22 DIAGNOSIS — K219 Gastro-esophageal reflux disease without esophagitis: Secondary | ICD-10-CM | POA: Diagnosis not present

## 2022-06-22 DIAGNOSIS — K5909 Other constipation: Secondary | ICD-10-CM | POA: Diagnosis not present

## 2022-06-23 DIAGNOSIS — Z6820 Body mass index (BMI) 20.0-20.9, adult: Secondary | ICD-10-CM | POA: Diagnosis not present

## 2022-06-23 DIAGNOSIS — E43 Unspecified severe protein-calorie malnutrition: Secondary | ICD-10-CM | POA: Diagnosis not present

## 2022-06-23 DIAGNOSIS — G311 Senile degeneration of brain, not elsewhere classified: Secondary | ICD-10-CM | POA: Diagnosis not present

## 2022-06-23 DIAGNOSIS — R296 Repeated falls: Secondary | ICD-10-CM | POA: Diagnosis not present

## 2022-06-23 DIAGNOSIS — F0283 Dementia in other diseases classified elsewhere, unspecified severity, with mood disturbance: Secondary | ICD-10-CM | POA: Diagnosis not present

## 2022-06-23 DIAGNOSIS — F0284 Dementia in other diseases classified elsewhere, unspecified severity, with anxiety: Secondary | ICD-10-CM | POA: Diagnosis not present

## 2022-06-25 DIAGNOSIS — Z6820 Body mass index (BMI) 20.0-20.9, adult: Secondary | ICD-10-CM | POA: Diagnosis not present

## 2022-06-25 DIAGNOSIS — F0283 Dementia in other diseases classified elsewhere, unspecified severity, with mood disturbance: Secondary | ICD-10-CM | POA: Diagnosis not present

## 2022-06-25 DIAGNOSIS — R296 Repeated falls: Secondary | ICD-10-CM | POA: Diagnosis not present

## 2022-06-25 DIAGNOSIS — E43 Unspecified severe protein-calorie malnutrition: Secondary | ICD-10-CM | POA: Diagnosis not present

## 2022-06-25 DIAGNOSIS — F0284 Dementia in other diseases classified elsewhere, unspecified severity, with anxiety: Secondary | ICD-10-CM | POA: Diagnosis not present

## 2022-06-25 DIAGNOSIS — G311 Senile degeneration of brain, not elsewhere classified: Secondary | ICD-10-CM | POA: Diagnosis not present

## 2022-06-27 DIAGNOSIS — F0283 Dementia in other diseases classified elsewhere, unspecified severity, with mood disturbance: Secondary | ICD-10-CM | POA: Diagnosis not present

## 2022-06-27 DIAGNOSIS — R296 Repeated falls: Secondary | ICD-10-CM | POA: Diagnosis not present

## 2022-06-27 DIAGNOSIS — E43 Unspecified severe protein-calorie malnutrition: Secondary | ICD-10-CM | POA: Diagnosis not present

## 2022-06-27 DIAGNOSIS — Z6820 Body mass index (BMI) 20.0-20.9, adult: Secondary | ICD-10-CM | POA: Diagnosis not present

## 2022-06-27 DIAGNOSIS — G311 Senile degeneration of brain, not elsewhere classified: Secondary | ICD-10-CM | POA: Diagnosis not present

## 2022-06-27 DIAGNOSIS — F0284 Dementia in other diseases classified elsewhere, unspecified severity, with anxiety: Secondary | ICD-10-CM | POA: Diagnosis not present

## 2022-06-30 DIAGNOSIS — Z6820 Body mass index (BMI) 20.0-20.9, adult: Secondary | ICD-10-CM | POA: Diagnosis not present

## 2022-06-30 DIAGNOSIS — F0284 Dementia in other diseases classified elsewhere, unspecified severity, with anxiety: Secondary | ICD-10-CM | POA: Diagnosis not present

## 2022-06-30 DIAGNOSIS — E43 Unspecified severe protein-calorie malnutrition: Secondary | ICD-10-CM | POA: Diagnosis not present

## 2022-06-30 DIAGNOSIS — F0283 Dementia in other diseases classified elsewhere, unspecified severity, with mood disturbance: Secondary | ICD-10-CM | POA: Diagnosis not present

## 2022-06-30 DIAGNOSIS — R296 Repeated falls: Secondary | ICD-10-CM | POA: Diagnosis not present

## 2022-06-30 DIAGNOSIS — G311 Senile degeneration of brain, not elsewhere classified: Secondary | ICD-10-CM | POA: Diagnosis not present

## 2022-07-02 DIAGNOSIS — F0283 Dementia in other diseases classified elsewhere, unspecified severity, with mood disturbance: Secondary | ICD-10-CM | POA: Diagnosis not present

## 2022-07-02 DIAGNOSIS — Z6820 Body mass index (BMI) 20.0-20.9, adult: Secondary | ICD-10-CM | POA: Diagnosis not present

## 2022-07-02 DIAGNOSIS — F0284 Dementia in other diseases classified elsewhere, unspecified severity, with anxiety: Secondary | ICD-10-CM | POA: Diagnosis not present

## 2022-07-02 DIAGNOSIS — E43 Unspecified severe protein-calorie malnutrition: Secondary | ICD-10-CM | POA: Diagnosis not present

## 2022-07-02 DIAGNOSIS — G311 Senile degeneration of brain, not elsewhere classified: Secondary | ICD-10-CM | POA: Diagnosis not present

## 2022-07-02 DIAGNOSIS — R296 Repeated falls: Secondary | ICD-10-CM | POA: Diagnosis not present

## 2022-07-03 DIAGNOSIS — K219 Gastro-esophageal reflux disease without esophagitis: Secondary | ICD-10-CM | POA: Diagnosis not present

## 2022-07-03 DIAGNOSIS — I480 Paroxysmal atrial fibrillation: Secondary | ICD-10-CM | POA: Diagnosis not present

## 2022-07-03 DIAGNOSIS — F0390 Unspecified dementia without behavioral disturbance: Secondary | ICD-10-CM | POA: Diagnosis not present

## 2022-07-03 DIAGNOSIS — I10 Essential (primary) hypertension: Secondary | ICD-10-CM | POA: Diagnosis not present

## 2022-07-04 DIAGNOSIS — R296 Repeated falls: Secondary | ICD-10-CM | POA: Diagnosis not present

## 2022-07-04 DIAGNOSIS — G311 Senile degeneration of brain, not elsewhere classified: Secondary | ICD-10-CM | POA: Diagnosis not present

## 2022-07-04 DIAGNOSIS — Z6820 Body mass index (BMI) 20.0-20.9, adult: Secondary | ICD-10-CM | POA: Diagnosis not present

## 2022-07-04 DIAGNOSIS — E43 Unspecified severe protein-calorie malnutrition: Secondary | ICD-10-CM | POA: Diagnosis not present

## 2022-07-04 DIAGNOSIS — F0283 Dementia in other diseases classified elsewhere, unspecified severity, with mood disturbance: Secondary | ICD-10-CM | POA: Diagnosis not present

## 2022-07-04 DIAGNOSIS — F0284 Dementia in other diseases classified elsewhere, unspecified severity, with anxiety: Secondary | ICD-10-CM | POA: Diagnosis not present

## 2022-07-07 DIAGNOSIS — E43 Unspecified severe protein-calorie malnutrition: Secondary | ICD-10-CM | POA: Diagnosis not present

## 2022-07-07 DIAGNOSIS — R296 Repeated falls: Secondary | ICD-10-CM | POA: Diagnosis not present

## 2022-07-07 DIAGNOSIS — F0283 Dementia in other diseases classified elsewhere, unspecified severity, with mood disturbance: Secondary | ICD-10-CM | POA: Diagnosis not present

## 2022-07-07 DIAGNOSIS — Z6820 Body mass index (BMI) 20.0-20.9, adult: Secondary | ICD-10-CM | POA: Diagnosis not present

## 2022-07-07 DIAGNOSIS — F0284 Dementia in other diseases classified elsewhere, unspecified severity, with anxiety: Secondary | ICD-10-CM | POA: Diagnosis not present

## 2022-07-07 DIAGNOSIS — G311 Senile degeneration of brain, not elsewhere classified: Secondary | ICD-10-CM | POA: Diagnosis not present

## 2022-07-08 DIAGNOSIS — F0283 Dementia in other diseases classified elsewhere, unspecified severity, with mood disturbance: Secondary | ICD-10-CM | POA: Diagnosis not present

## 2022-07-08 DIAGNOSIS — F0284 Dementia in other diseases classified elsewhere, unspecified severity, with anxiety: Secondary | ICD-10-CM | POA: Diagnosis not present

## 2022-07-08 DIAGNOSIS — G311 Senile degeneration of brain, not elsewhere classified: Secondary | ICD-10-CM | POA: Diagnosis not present

## 2022-07-08 DIAGNOSIS — R296 Repeated falls: Secondary | ICD-10-CM | POA: Diagnosis not present

## 2022-07-08 DIAGNOSIS — E43 Unspecified severe protein-calorie malnutrition: Secondary | ICD-10-CM | POA: Diagnosis not present

## 2022-07-08 DIAGNOSIS — Z6820 Body mass index (BMI) 20.0-20.9, adult: Secondary | ICD-10-CM | POA: Diagnosis not present

## 2022-07-09 DIAGNOSIS — F0284 Dementia in other diseases classified elsewhere, unspecified severity, with anxiety: Secondary | ICD-10-CM | POA: Diagnosis not present

## 2022-07-09 DIAGNOSIS — F0283 Dementia in other diseases classified elsewhere, unspecified severity, with mood disturbance: Secondary | ICD-10-CM | POA: Diagnosis not present

## 2022-07-09 DIAGNOSIS — E43 Unspecified severe protein-calorie malnutrition: Secondary | ICD-10-CM | POA: Diagnosis not present

## 2022-07-09 DIAGNOSIS — Z6820 Body mass index (BMI) 20.0-20.9, adult: Secondary | ICD-10-CM | POA: Diagnosis not present

## 2022-07-09 DIAGNOSIS — R296 Repeated falls: Secondary | ICD-10-CM | POA: Diagnosis not present

## 2022-07-09 DIAGNOSIS — G311 Senile degeneration of brain, not elsewhere classified: Secondary | ICD-10-CM | POA: Diagnosis not present

## 2022-07-11 DIAGNOSIS — R296 Repeated falls: Secondary | ICD-10-CM | POA: Diagnosis not present

## 2022-07-11 DIAGNOSIS — I4891 Unspecified atrial fibrillation: Secondary | ICD-10-CM | POA: Diagnosis not present

## 2022-07-11 DIAGNOSIS — E785 Hyperlipidemia, unspecified: Secondary | ICD-10-CM | POA: Diagnosis not present

## 2022-07-11 DIAGNOSIS — L299 Pruritus, unspecified: Secondary | ICD-10-CM | POA: Diagnosis not present

## 2022-07-11 DIAGNOSIS — E43 Unspecified severe protein-calorie malnutrition: Secondary | ICD-10-CM | POA: Diagnosis not present

## 2022-07-11 DIAGNOSIS — D631 Anemia in chronic kidney disease: Secondary | ICD-10-CM | POA: Diagnosis not present

## 2022-07-11 DIAGNOSIS — H409 Unspecified glaucoma: Secondary | ICD-10-CM | POA: Diagnosis not present

## 2022-07-11 DIAGNOSIS — F0284 Dementia in other diseases classified elsewhere, unspecified severity, with anxiety: Secondary | ICD-10-CM | POA: Diagnosis not present

## 2022-07-11 DIAGNOSIS — N1831 Chronic kidney disease, stage 3a: Secondary | ICD-10-CM | POA: Diagnosis not present

## 2022-07-11 DIAGNOSIS — G311 Senile degeneration of brain, not elsewhere classified: Secondary | ICD-10-CM | POA: Diagnosis not present

## 2022-07-11 DIAGNOSIS — R159 Full incontinence of feces: Secondary | ICD-10-CM | POA: Diagnosis not present

## 2022-07-11 DIAGNOSIS — K219 Gastro-esophageal reflux disease without esophagitis: Secondary | ICD-10-CM | POA: Diagnosis not present

## 2022-07-11 DIAGNOSIS — F0283 Dementia in other diseases classified elsewhere, unspecified severity, with mood disturbance: Secondary | ICD-10-CM | POA: Diagnosis not present

## 2022-07-11 DIAGNOSIS — R32 Unspecified urinary incontinence: Secondary | ICD-10-CM | POA: Diagnosis not present

## 2022-07-11 DIAGNOSIS — M810 Age-related osteoporosis without current pathological fracture: Secondary | ICD-10-CM | POA: Diagnosis not present

## 2022-07-11 DIAGNOSIS — Z6820 Body mass index (BMI) 20.0-20.9, adult: Secondary | ICD-10-CM | POA: Diagnosis not present

## 2022-07-11 DIAGNOSIS — Z741 Need for assistance with personal care: Secondary | ICD-10-CM | POA: Diagnosis not present

## 2022-07-11 DIAGNOSIS — Z8719 Personal history of other diseases of the digestive system: Secondary | ICD-10-CM | POA: Diagnosis not present

## 2022-07-11 DIAGNOSIS — E538 Deficiency of other specified B group vitamins: Secondary | ICD-10-CM | POA: Diagnosis not present

## 2022-07-11 DIAGNOSIS — I129 Hypertensive chronic kidney disease with stage 1 through stage 4 chronic kidney disease, or unspecified chronic kidney disease: Secondary | ICD-10-CM | POA: Diagnosis not present

## 2022-07-11 DIAGNOSIS — G40909 Epilepsy, unspecified, not intractable, without status epilepticus: Secondary | ICD-10-CM | POA: Diagnosis not present

## 2022-07-11 DIAGNOSIS — Z87440 Personal history of urinary (tract) infections: Secondary | ICD-10-CM | POA: Diagnosis not present

## 2022-07-12 DIAGNOSIS — F0283 Dementia in other diseases classified elsewhere, unspecified severity, with mood disturbance: Secondary | ICD-10-CM | POA: Diagnosis not present

## 2022-07-12 DIAGNOSIS — G311 Senile degeneration of brain, not elsewhere classified: Secondary | ICD-10-CM | POA: Diagnosis not present

## 2022-07-12 DIAGNOSIS — E43 Unspecified severe protein-calorie malnutrition: Secondary | ICD-10-CM | POA: Diagnosis not present

## 2022-07-12 DIAGNOSIS — R296 Repeated falls: Secondary | ICD-10-CM | POA: Diagnosis not present

## 2022-07-12 DIAGNOSIS — Z6820 Body mass index (BMI) 20.0-20.9, adult: Secondary | ICD-10-CM | POA: Diagnosis not present

## 2022-07-12 DIAGNOSIS — F0284 Dementia in other diseases classified elsewhere, unspecified severity, with anxiety: Secondary | ICD-10-CM | POA: Diagnosis not present

## 2022-07-13 DIAGNOSIS — K219 Gastro-esophageal reflux disease without esophagitis: Secondary | ICD-10-CM | POA: Diagnosis not present

## 2022-07-13 DIAGNOSIS — I480 Paroxysmal atrial fibrillation: Secondary | ICD-10-CM | POA: Diagnosis not present

## 2022-07-13 DIAGNOSIS — F0280 Dementia in other diseases classified elsewhere without behavioral disturbance: Secondary | ICD-10-CM | POA: Diagnosis not present

## 2022-07-13 DIAGNOSIS — I10 Essential (primary) hypertension: Secondary | ICD-10-CM | POA: Diagnosis not present

## 2022-07-14 DIAGNOSIS — G311 Senile degeneration of brain, not elsewhere classified: Secondary | ICD-10-CM | POA: Diagnosis not present

## 2022-07-14 DIAGNOSIS — F0284 Dementia in other diseases classified elsewhere, unspecified severity, with anxiety: Secondary | ICD-10-CM | POA: Diagnosis not present

## 2022-07-14 DIAGNOSIS — E43 Unspecified severe protein-calorie malnutrition: Secondary | ICD-10-CM | POA: Diagnosis not present

## 2022-07-14 DIAGNOSIS — R296 Repeated falls: Secondary | ICD-10-CM | POA: Diagnosis not present

## 2022-07-14 DIAGNOSIS — F0283 Dementia in other diseases classified elsewhere, unspecified severity, with mood disturbance: Secondary | ICD-10-CM | POA: Diagnosis not present

## 2022-07-14 DIAGNOSIS — Z6820 Body mass index (BMI) 20.0-20.9, adult: Secondary | ICD-10-CM | POA: Diagnosis not present

## 2022-07-16 DIAGNOSIS — F0283 Dementia in other diseases classified elsewhere, unspecified severity, with mood disturbance: Secondary | ICD-10-CM | POA: Diagnosis not present

## 2022-07-16 DIAGNOSIS — R296 Repeated falls: Secondary | ICD-10-CM | POA: Diagnosis not present

## 2022-07-16 DIAGNOSIS — G311 Senile degeneration of brain, not elsewhere classified: Secondary | ICD-10-CM | POA: Diagnosis not present

## 2022-07-16 DIAGNOSIS — F0284 Dementia in other diseases classified elsewhere, unspecified severity, with anxiety: Secondary | ICD-10-CM | POA: Diagnosis not present

## 2022-07-16 DIAGNOSIS — Z6820 Body mass index (BMI) 20.0-20.9, adult: Secondary | ICD-10-CM | POA: Diagnosis not present

## 2022-07-16 DIAGNOSIS — E43 Unspecified severe protein-calorie malnutrition: Secondary | ICD-10-CM | POA: Diagnosis not present

## 2022-07-17 DIAGNOSIS — F0283 Dementia in other diseases classified elsewhere, unspecified severity, with mood disturbance: Secondary | ICD-10-CM | POA: Diagnosis not present

## 2022-07-17 DIAGNOSIS — F0284 Dementia in other diseases classified elsewhere, unspecified severity, with anxiety: Secondary | ICD-10-CM | POA: Diagnosis not present

## 2022-07-17 DIAGNOSIS — E43 Unspecified severe protein-calorie malnutrition: Secondary | ICD-10-CM | POA: Diagnosis not present

## 2022-07-17 DIAGNOSIS — G311 Senile degeneration of brain, not elsewhere classified: Secondary | ICD-10-CM | POA: Diagnosis not present

## 2022-07-17 DIAGNOSIS — Z6820 Body mass index (BMI) 20.0-20.9, adult: Secondary | ICD-10-CM | POA: Diagnosis not present

## 2022-07-17 DIAGNOSIS — R296 Repeated falls: Secondary | ICD-10-CM | POA: Diagnosis not present

## 2022-07-18 DIAGNOSIS — R296 Repeated falls: Secondary | ICD-10-CM | POA: Diagnosis not present

## 2022-07-18 DIAGNOSIS — G311 Senile degeneration of brain, not elsewhere classified: Secondary | ICD-10-CM | POA: Diagnosis not present

## 2022-07-18 DIAGNOSIS — Z6820 Body mass index (BMI) 20.0-20.9, adult: Secondary | ICD-10-CM | POA: Diagnosis not present

## 2022-07-18 DIAGNOSIS — E43 Unspecified severe protein-calorie malnutrition: Secondary | ICD-10-CM | POA: Diagnosis not present

## 2022-07-18 DIAGNOSIS — F0283 Dementia in other diseases classified elsewhere, unspecified severity, with mood disturbance: Secondary | ICD-10-CM | POA: Diagnosis not present

## 2022-07-18 DIAGNOSIS — F0284 Dementia in other diseases classified elsewhere, unspecified severity, with anxiety: Secondary | ICD-10-CM | POA: Diagnosis not present

## 2022-07-19 DIAGNOSIS — F0280 Dementia in other diseases classified elsewhere without behavioral disturbance: Secondary | ICD-10-CM | POA: Diagnosis not present

## 2022-07-19 DIAGNOSIS — G4089 Other seizures: Secondary | ICD-10-CM | POA: Diagnosis not present

## 2022-07-19 DIAGNOSIS — K219 Gastro-esophageal reflux disease without esophagitis: Secondary | ICD-10-CM | POA: Diagnosis not present

## 2022-07-19 DIAGNOSIS — F329 Major depressive disorder, single episode, unspecified: Secondary | ICD-10-CM | POA: Diagnosis not present

## 2022-07-19 DIAGNOSIS — I480 Paroxysmal atrial fibrillation: Secondary | ICD-10-CM | POA: Diagnosis not present

## 2022-07-19 DIAGNOSIS — K5909 Other constipation: Secondary | ICD-10-CM | POA: Diagnosis not present

## 2022-07-22 DIAGNOSIS — Z6820 Body mass index (BMI) 20.0-20.9, adult: Secondary | ICD-10-CM | POA: Diagnosis not present

## 2022-07-22 DIAGNOSIS — F0283 Dementia in other diseases classified elsewhere, unspecified severity, with mood disturbance: Secondary | ICD-10-CM | POA: Diagnosis not present

## 2022-07-22 DIAGNOSIS — F0284 Dementia in other diseases classified elsewhere, unspecified severity, with anxiety: Secondary | ICD-10-CM | POA: Diagnosis not present

## 2022-07-22 DIAGNOSIS — R296 Repeated falls: Secondary | ICD-10-CM | POA: Diagnosis not present

## 2022-07-22 DIAGNOSIS — G311 Senile degeneration of brain, not elsewhere classified: Secondary | ICD-10-CM | POA: Diagnosis not present

## 2022-07-22 DIAGNOSIS — E43 Unspecified severe protein-calorie malnutrition: Secondary | ICD-10-CM | POA: Diagnosis not present

## 2022-07-23 DIAGNOSIS — R296 Repeated falls: Secondary | ICD-10-CM | POA: Diagnosis not present

## 2022-07-23 DIAGNOSIS — G311 Senile degeneration of brain, not elsewhere classified: Secondary | ICD-10-CM | POA: Diagnosis not present

## 2022-07-23 DIAGNOSIS — K219 Gastro-esophageal reflux disease without esophagitis: Secondary | ICD-10-CM | POA: Diagnosis not present

## 2022-07-23 DIAGNOSIS — Z6820 Body mass index (BMI) 20.0-20.9, adult: Secondary | ICD-10-CM | POA: Diagnosis not present

## 2022-07-23 DIAGNOSIS — E43 Unspecified severe protein-calorie malnutrition: Secondary | ICD-10-CM | POA: Diagnosis not present

## 2022-07-23 DIAGNOSIS — I10 Essential (primary) hypertension: Secondary | ICD-10-CM | POA: Diagnosis not present

## 2022-07-23 DIAGNOSIS — F0283 Dementia in other diseases classified elsewhere, unspecified severity, with mood disturbance: Secondary | ICD-10-CM | POA: Diagnosis not present

## 2022-07-23 DIAGNOSIS — F0284 Dementia in other diseases classified elsewhere, unspecified severity, with anxiety: Secondary | ICD-10-CM | POA: Diagnosis not present

## 2022-07-23 DIAGNOSIS — F0280 Dementia in other diseases classified elsewhere without behavioral disturbance: Secondary | ICD-10-CM | POA: Diagnosis not present

## 2022-07-23 DIAGNOSIS — M150 Primary generalized (osteo)arthritis: Secondary | ICD-10-CM | POA: Diagnosis not present

## 2022-07-23 DIAGNOSIS — I480 Paroxysmal atrial fibrillation: Secondary | ICD-10-CM | POA: Diagnosis not present

## 2022-07-25 DIAGNOSIS — F0283 Dementia in other diseases classified elsewhere, unspecified severity, with mood disturbance: Secondary | ICD-10-CM | POA: Diagnosis not present

## 2022-07-25 DIAGNOSIS — F0284 Dementia in other diseases classified elsewhere, unspecified severity, with anxiety: Secondary | ICD-10-CM | POA: Diagnosis not present

## 2022-07-25 DIAGNOSIS — R296 Repeated falls: Secondary | ICD-10-CM | POA: Diagnosis not present

## 2022-07-25 DIAGNOSIS — G311 Senile degeneration of brain, not elsewhere classified: Secondary | ICD-10-CM | POA: Diagnosis not present

## 2022-07-25 DIAGNOSIS — Z6820 Body mass index (BMI) 20.0-20.9, adult: Secondary | ICD-10-CM | POA: Diagnosis not present

## 2022-07-25 DIAGNOSIS — E43 Unspecified severe protein-calorie malnutrition: Secondary | ICD-10-CM | POA: Diagnosis not present

## 2022-07-26 DIAGNOSIS — G311 Senile degeneration of brain, not elsewhere classified: Secondary | ICD-10-CM | POA: Diagnosis not present

## 2022-07-26 DIAGNOSIS — F0284 Dementia in other diseases classified elsewhere, unspecified severity, with anxiety: Secondary | ICD-10-CM | POA: Diagnosis not present

## 2022-07-26 DIAGNOSIS — Z6820 Body mass index (BMI) 20.0-20.9, adult: Secondary | ICD-10-CM | POA: Diagnosis not present

## 2022-07-26 DIAGNOSIS — R296 Repeated falls: Secondary | ICD-10-CM | POA: Diagnosis not present

## 2022-07-26 DIAGNOSIS — F0283 Dementia in other diseases classified elsewhere, unspecified severity, with mood disturbance: Secondary | ICD-10-CM | POA: Diagnosis not present

## 2022-07-26 DIAGNOSIS — E43 Unspecified severe protein-calorie malnutrition: Secondary | ICD-10-CM | POA: Diagnosis not present

## 2022-07-27 DIAGNOSIS — F0284 Dementia in other diseases classified elsewhere, unspecified severity, with anxiety: Secondary | ICD-10-CM | POA: Diagnosis not present

## 2022-07-27 DIAGNOSIS — E43 Unspecified severe protein-calorie malnutrition: Secondary | ICD-10-CM | POA: Diagnosis not present

## 2022-07-27 DIAGNOSIS — G311 Senile degeneration of brain, not elsewhere classified: Secondary | ICD-10-CM | POA: Diagnosis not present

## 2022-07-27 DIAGNOSIS — F0283 Dementia in other diseases classified elsewhere, unspecified severity, with mood disturbance: Secondary | ICD-10-CM | POA: Diagnosis not present

## 2022-07-27 DIAGNOSIS — R296 Repeated falls: Secondary | ICD-10-CM | POA: Diagnosis not present

## 2022-07-27 DIAGNOSIS — Z6820 Body mass index (BMI) 20.0-20.9, adult: Secondary | ICD-10-CM | POA: Diagnosis not present

## 2022-07-30 DIAGNOSIS — G311 Senile degeneration of brain, not elsewhere classified: Secondary | ICD-10-CM | POA: Diagnosis not present

## 2022-07-30 DIAGNOSIS — Z6820 Body mass index (BMI) 20.0-20.9, adult: Secondary | ICD-10-CM | POA: Diagnosis not present

## 2022-07-30 DIAGNOSIS — E43 Unspecified severe protein-calorie malnutrition: Secondary | ICD-10-CM | POA: Diagnosis not present

## 2022-07-30 DIAGNOSIS — F0283 Dementia in other diseases classified elsewhere, unspecified severity, with mood disturbance: Secondary | ICD-10-CM | POA: Diagnosis not present

## 2022-07-30 DIAGNOSIS — F0284 Dementia in other diseases classified elsewhere, unspecified severity, with anxiety: Secondary | ICD-10-CM | POA: Diagnosis not present

## 2022-07-30 DIAGNOSIS — R296 Repeated falls: Secondary | ICD-10-CM | POA: Diagnosis not present

## 2022-07-31 DIAGNOSIS — F0284 Dementia in other diseases classified elsewhere, unspecified severity, with anxiety: Secondary | ICD-10-CM | POA: Diagnosis not present

## 2022-07-31 DIAGNOSIS — R296 Repeated falls: Secondary | ICD-10-CM | POA: Diagnosis not present

## 2022-07-31 DIAGNOSIS — G311 Senile degeneration of brain, not elsewhere classified: Secondary | ICD-10-CM | POA: Diagnosis not present

## 2022-07-31 DIAGNOSIS — F0283 Dementia in other diseases classified elsewhere, unspecified severity, with mood disturbance: Secondary | ICD-10-CM | POA: Diagnosis not present

## 2022-07-31 DIAGNOSIS — Z6820 Body mass index (BMI) 20.0-20.9, adult: Secondary | ICD-10-CM | POA: Diagnosis not present

## 2022-07-31 DIAGNOSIS — E43 Unspecified severe protein-calorie malnutrition: Secondary | ICD-10-CM | POA: Diagnosis not present

## 2022-08-01 DIAGNOSIS — F0284 Dementia in other diseases classified elsewhere, unspecified severity, with anxiety: Secondary | ICD-10-CM | POA: Diagnosis not present

## 2022-08-01 DIAGNOSIS — Z6820 Body mass index (BMI) 20.0-20.9, adult: Secondary | ICD-10-CM | POA: Diagnosis not present

## 2022-08-01 DIAGNOSIS — E43 Unspecified severe protein-calorie malnutrition: Secondary | ICD-10-CM | POA: Diagnosis not present

## 2022-08-01 DIAGNOSIS — R296 Repeated falls: Secondary | ICD-10-CM | POA: Diagnosis not present

## 2022-08-01 DIAGNOSIS — F0283 Dementia in other diseases classified elsewhere, unspecified severity, with mood disturbance: Secondary | ICD-10-CM | POA: Diagnosis not present

## 2022-08-01 DIAGNOSIS — G311 Senile degeneration of brain, not elsewhere classified: Secondary | ICD-10-CM | POA: Diagnosis not present

## 2022-08-03 DIAGNOSIS — R296 Repeated falls: Secondary | ICD-10-CM | POA: Diagnosis not present

## 2022-08-03 DIAGNOSIS — Z6820 Body mass index (BMI) 20.0-20.9, adult: Secondary | ICD-10-CM | POA: Diagnosis not present

## 2022-08-03 DIAGNOSIS — F0284 Dementia in other diseases classified elsewhere, unspecified severity, with anxiety: Secondary | ICD-10-CM | POA: Diagnosis not present

## 2022-08-03 DIAGNOSIS — E43 Unspecified severe protein-calorie malnutrition: Secondary | ICD-10-CM | POA: Diagnosis not present

## 2022-08-03 DIAGNOSIS — G311 Senile degeneration of brain, not elsewhere classified: Secondary | ICD-10-CM | POA: Diagnosis not present

## 2022-08-03 DIAGNOSIS — F0283 Dementia in other diseases classified elsewhere, unspecified severity, with mood disturbance: Secondary | ICD-10-CM | POA: Diagnosis not present

## 2022-08-05 DIAGNOSIS — Z6820 Body mass index (BMI) 20.0-20.9, adult: Secondary | ICD-10-CM | POA: Diagnosis not present

## 2022-08-05 DIAGNOSIS — R296 Repeated falls: Secondary | ICD-10-CM | POA: Diagnosis not present

## 2022-08-05 DIAGNOSIS — F0283 Dementia in other diseases classified elsewhere, unspecified severity, with mood disturbance: Secondary | ICD-10-CM | POA: Diagnosis not present

## 2022-08-05 DIAGNOSIS — F0284 Dementia in other diseases classified elsewhere, unspecified severity, with anxiety: Secondary | ICD-10-CM | POA: Diagnosis not present

## 2022-08-05 DIAGNOSIS — E43 Unspecified severe protein-calorie malnutrition: Secondary | ICD-10-CM | POA: Diagnosis not present

## 2022-08-05 DIAGNOSIS — G311 Senile degeneration of brain, not elsewhere classified: Secondary | ICD-10-CM | POA: Diagnosis not present

## 2022-08-06 DIAGNOSIS — F0284 Dementia in other diseases classified elsewhere, unspecified severity, with anxiety: Secondary | ICD-10-CM | POA: Diagnosis not present

## 2022-08-06 DIAGNOSIS — E43 Unspecified severe protein-calorie malnutrition: Secondary | ICD-10-CM | POA: Diagnosis not present

## 2022-08-06 DIAGNOSIS — Z6820 Body mass index (BMI) 20.0-20.9, adult: Secondary | ICD-10-CM | POA: Diagnosis not present

## 2022-08-06 DIAGNOSIS — G311 Senile degeneration of brain, not elsewhere classified: Secondary | ICD-10-CM | POA: Diagnosis not present

## 2022-08-06 DIAGNOSIS — F0283 Dementia in other diseases classified elsewhere, unspecified severity, with mood disturbance: Secondary | ICD-10-CM | POA: Diagnosis not present

## 2022-08-06 DIAGNOSIS — R296 Repeated falls: Secondary | ICD-10-CM | POA: Diagnosis not present

## 2022-08-07 DIAGNOSIS — F0283 Dementia in other diseases classified elsewhere, unspecified severity, with mood disturbance: Secondary | ICD-10-CM | POA: Diagnosis not present

## 2022-08-07 DIAGNOSIS — F0284 Dementia in other diseases classified elsewhere, unspecified severity, with anxiety: Secondary | ICD-10-CM | POA: Diagnosis not present

## 2022-08-07 DIAGNOSIS — Z6820 Body mass index (BMI) 20.0-20.9, adult: Secondary | ICD-10-CM | POA: Diagnosis not present

## 2022-08-07 DIAGNOSIS — G311 Senile degeneration of brain, not elsewhere classified: Secondary | ICD-10-CM | POA: Diagnosis not present

## 2022-08-07 DIAGNOSIS — E43 Unspecified severe protein-calorie malnutrition: Secondary | ICD-10-CM | POA: Diagnosis not present

## 2022-08-07 DIAGNOSIS — R296 Repeated falls: Secondary | ICD-10-CM | POA: Diagnosis not present

## 2022-08-10 DIAGNOSIS — R296 Repeated falls: Secondary | ICD-10-CM | POA: Diagnosis not present

## 2022-08-10 DIAGNOSIS — E785 Hyperlipidemia, unspecified: Secondary | ICD-10-CM | POA: Diagnosis not present

## 2022-08-10 DIAGNOSIS — E538 Deficiency of other specified B group vitamins: Secondary | ICD-10-CM | POA: Diagnosis not present

## 2022-08-10 DIAGNOSIS — R159 Full incontinence of feces: Secondary | ICD-10-CM | POA: Diagnosis not present

## 2022-08-10 DIAGNOSIS — G311 Senile degeneration of brain, not elsewhere classified: Secondary | ICD-10-CM | POA: Diagnosis not present

## 2022-08-10 DIAGNOSIS — D631 Anemia in chronic kidney disease: Secondary | ICD-10-CM | POA: Diagnosis not present

## 2022-08-10 DIAGNOSIS — E43 Unspecified severe protein-calorie malnutrition: Secondary | ICD-10-CM | POA: Diagnosis not present

## 2022-08-10 DIAGNOSIS — M810 Age-related osteoporosis without current pathological fracture: Secondary | ICD-10-CM | POA: Diagnosis not present

## 2022-08-10 DIAGNOSIS — G40909 Epilepsy, unspecified, not intractable, without status epilepticus: Secondary | ICD-10-CM | POA: Diagnosis not present

## 2022-08-10 DIAGNOSIS — N1831 Chronic kidney disease, stage 3a: Secondary | ICD-10-CM | POA: Diagnosis not present

## 2022-08-10 DIAGNOSIS — Z8719 Personal history of other diseases of the digestive system: Secondary | ICD-10-CM | POA: Diagnosis not present

## 2022-08-10 DIAGNOSIS — Z741 Need for assistance with personal care: Secondary | ICD-10-CM | POA: Diagnosis not present

## 2022-08-10 DIAGNOSIS — Z87440 Personal history of urinary (tract) infections: Secondary | ICD-10-CM | POA: Diagnosis not present

## 2022-08-10 DIAGNOSIS — I129 Hypertensive chronic kidney disease with stage 1 through stage 4 chronic kidney disease, or unspecified chronic kidney disease: Secondary | ICD-10-CM | POA: Diagnosis not present

## 2022-08-10 DIAGNOSIS — F0283 Dementia in other diseases classified elsewhere, unspecified severity, with mood disturbance: Secondary | ICD-10-CM | POA: Diagnosis not present

## 2022-08-10 DIAGNOSIS — I4891 Unspecified atrial fibrillation: Secondary | ICD-10-CM | POA: Diagnosis not present

## 2022-08-10 DIAGNOSIS — Z6820 Body mass index (BMI) 20.0-20.9, adult: Secondary | ICD-10-CM | POA: Diagnosis not present

## 2022-08-10 DIAGNOSIS — K219 Gastro-esophageal reflux disease without esophagitis: Secondary | ICD-10-CM | POA: Diagnosis not present

## 2022-08-10 DIAGNOSIS — R32 Unspecified urinary incontinence: Secondary | ICD-10-CM | POA: Diagnosis not present

## 2022-08-10 DIAGNOSIS — F0284 Dementia in other diseases classified elsewhere, unspecified severity, with anxiety: Secondary | ICD-10-CM | POA: Diagnosis not present

## 2022-08-10 DIAGNOSIS — L299 Pruritus, unspecified: Secondary | ICD-10-CM | POA: Diagnosis not present

## 2022-08-10 DIAGNOSIS — H409 Unspecified glaucoma: Secondary | ICD-10-CM | POA: Diagnosis not present

## 2022-08-11 DIAGNOSIS — G311 Senile degeneration of brain, not elsewhere classified: Secondary | ICD-10-CM | POA: Diagnosis not present

## 2022-08-11 DIAGNOSIS — R296 Repeated falls: Secondary | ICD-10-CM | POA: Diagnosis not present

## 2022-08-11 DIAGNOSIS — E43 Unspecified severe protein-calorie malnutrition: Secondary | ICD-10-CM | POA: Diagnosis not present

## 2022-08-11 DIAGNOSIS — Z6820 Body mass index (BMI) 20.0-20.9, adult: Secondary | ICD-10-CM | POA: Diagnosis not present

## 2022-08-11 DIAGNOSIS — F0284 Dementia in other diseases classified elsewhere, unspecified severity, with anxiety: Secondary | ICD-10-CM | POA: Diagnosis not present

## 2022-08-11 DIAGNOSIS — F0283 Dementia in other diseases classified elsewhere, unspecified severity, with mood disturbance: Secondary | ICD-10-CM | POA: Diagnosis not present

## 2022-08-13 DIAGNOSIS — Z6820 Body mass index (BMI) 20.0-20.9, adult: Secondary | ICD-10-CM | POA: Diagnosis not present

## 2022-08-13 DIAGNOSIS — R296 Repeated falls: Secondary | ICD-10-CM | POA: Diagnosis not present

## 2022-08-13 DIAGNOSIS — F0283 Dementia in other diseases classified elsewhere, unspecified severity, with mood disturbance: Secondary | ICD-10-CM | POA: Diagnosis not present

## 2022-08-13 DIAGNOSIS — G311 Senile degeneration of brain, not elsewhere classified: Secondary | ICD-10-CM | POA: Diagnosis not present

## 2022-08-13 DIAGNOSIS — E43 Unspecified severe protein-calorie malnutrition: Secondary | ICD-10-CM | POA: Diagnosis not present

## 2022-08-13 DIAGNOSIS — F0284 Dementia in other diseases classified elsewhere, unspecified severity, with anxiety: Secondary | ICD-10-CM | POA: Diagnosis not present

## 2022-08-15 DIAGNOSIS — I480 Paroxysmal atrial fibrillation: Secondary | ICD-10-CM | POA: Diagnosis not present

## 2022-08-15 DIAGNOSIS — F0280 Dementia in other diseases classified elsewhere without behavioral disturbance: Secondary | ICD-10-CM | POA: Diagnosis not present

## 2022-08-15 DIAGNOSIS — F0390 Unspecified dementia without behavioral disturbance: Secondary | ICD-10-CM | POA: Diagnosis not present

## 2022-08-15 DIAGNOSIS — G9340 Encephalopathy, unspecified: Secondary | ICD-10-CM | POA: Diagnosis not present

## 2022-08-15 DIAGNOSIS — G40909 Epilepsy, unspecified, not intractable, without status epilepticus: Secondary | ICD-10-CM | POA: Diagnosis not present

## 2022-08-15 DIAGNOSIS — K219 Gastro-esophageal reflux disease without esophagitis: Secondary | ICD-10-CM | POA: Diagnosis not present

## 2022-08-15 DIAGNOSIS — D649 Anemia, unspecified: Secondary | ICD-10-CM | POA: Diagnosis not present

## 2022-08-15 DIAGNOSIS — I10 Essential (primary) hypertension: Secondary | ICD-10-CM | POA: Diagnosis not present

## 2022-08-16 DIAGNOSIS — Z6820 Body mass index (BMI) 20.0-20.9, adult: Secondary | ICD-10-CM | POA: Diagnosis not present

## 2022-08-16 DIAGNOSIS — E43 Unspecified severe protein-calorie malnutrition: Secondary | ICD-10-CM | POA: Diagnosis not present

## 2022-08-16 DIAGNOSIS — R296 Repeated falls: Secondary | ICD-10-CM | POA: Diagnosis not present

## 2022-08-16 DIAGNOSIS — F0283 Dementia in other diseases classified elsewhere, unspecified severity, with mood disturbance: Secondary | ICD-10-CM | POA: Diagnosis not present

## 2022-08-16 DIAGNOSIS — F0284 Dementia in other diseases classified elsewhere, unspecified severity, with anxiety: Secondary | ICD-10-CM | POA: Diagnosis not present

## 2022-08-16 DIAGNOSIS — G311 Senile degeneration of brain, not elsewhere classified: Secondary | ICD-10-CM | POA: Diagnosis not present

## 2022-08-18 DIAGNOSIS — G311 Senile degeneration of brain, not elsewhere classified: Secondary | ICD-10-CM | POA: Diagnosis not present

## 2022-08-18 DIAGNOSIS — E43 Unspecified severe protein-calorie malnutrition: Secondary | ICD-10-CM | POA: Diagnosis not present

## 2022-08-18 DIAGNOSIS — R296 Repeated falls: Secondary | ICD-10-CM | POA: Diagnosis not present

## 2022-08-18 DIAGNOSIS — F0283 Dementia in other diseases classified elsewhere, unspecified severity, with mood disturbance: Secondary | ICD-10-CM | POA: Diagnosis not present

## 2022-08-18 DIAGNOSIS — I5033 Acute on chronic diastolic (congestive) heart failure: Secondary | ICD-10-CM | POA: Diagnosis not present

## 2022-08-18 DIAGNOSIS — Z6820 Body mass index (BMI) 20.0-20.9, adult: Secondary | ICD-10-CM | POA: Diagnosis not present

## 2022-08-18 DIAGNOSIS — F0284 Dementia in other diseases classified elsewhere, unspecified severity, with anxiety: Secondary | ICD-10-CM | POA: Diagnosis not present

## 2022-08-19 DIAGNOSIS — R296 Repeated falls: Secondary | ICD-10-CM | POA: Diagnosis not present

## 2022-08-19 DIAGNOSIS — E43 Unspecified severe protein-calorie malnutrition: Secondary | ICD-10-CM | POA: Diagnosis not present

## 2022-08-19 DIAGNOSIS — F0283 Dementia in other diseases classified elsewhere, unspecified severity, with mood disturbance: Secondary | ICD-10-CM | POA: Diagnosis not present

## 2022-08-19 DIAGNOSIS — G311 Senile degeneration of brain, not elsewhere classified: Secondary | ICD-10-CM | POA: Diagnosis not present

## 2022-08-19 DIAGNOSIS — Z6820 Body mass index (BMI) 20.0-20.9, adult: Secondary | ICD-10-CM | POA: Diagnosis not present

## 2022-08-19 DIAGNOSIS — F0284 Dementia in other diseases classified elsewhere, unspecified severity, with anxiety: Secondary | ICD-10-CM | POA: Diagnosis not present

## 2022-08-20 DIAGNOSIS — G311 Senile degeneration of brain, not elsewhere classified: Secondary | ICD-10-CM | POA: Diagnosis not present

## 2022-08-20 DIAGNOSIS — R296 Repeated falls: Secondary | ICD-10-CM | POA: Diagnosis not present

## 2022-08-20 DIAGNOSIS — F0283 Dementia in other diseases classified elsewhere, unspecified severity, with mood disturbance: Secondary | ICD-10-CM | POA: Diagnosis not present

## 2022-08-20 DIAGNOSIS — F0284 Dementia in other diseases classified elsewhere, unspecified severity, with anxiety: Secondary | ICD-10-CM | POA: Diagnosis not present

## 2022-08-20 DIAGNOSIS — Z6820 Body mass index (BMI) 20.0-20.9, adult: Secondary | ICD-10-CM | POA: Diagnosis not present

## 2022-08-20 DIAGNOSIS — E43 Unspecified severe protein-calorie malnutrition: Secondary | ICD-10-CM | POA: Diagnosis not present

## 2022-08-22 DIAGNOSIS — E43 Unspecified severe protein-calorie malnutrition: Secondary | ICD-10-CM | POA: Diagnosis not present

## 2022-08-22 DIAGNOSIS — Z6820 Body mass index (BMI) 20.0-20.9, adult: Secondary | ICD-10-CM | POA: Diagnosis not present

## 2022-08-22 DIAGNOSIS — R296 Repeated falls: Secondary | ICD-10-CM | POA: Diagnosis not present

## 2022-08-22 DIAGNOSIS — G311 Senile degeneration of brain, not elsewhere classified: Secondary | ICD-10-CM | POA: Diagnosis not present

## 2022-08-22 DIAGNOSIS — F0284 Dementia in other diseases classified elsewhere, unspecified severity, with anxiety: Secondary | ICD-10-CM | POA: Diagnosis not present

## 2022-08-22 DIAGNOSIS — F0283 Dementia in other diseases classified elsewhere, unspecified severity, with mood disturbance: Secondary | ICD-10-CM | POA: Diagnosis not present

## 2022-08-24 DIAGNOSIS — R296 Repeated falls: Secondary | ICD-10-CM | POA: Diagnosis not present

## 2022-08-24 DIAGNOSIS — F0283 Dementia in other diseases classified elsewhere, unspecified severity, with mood disturbance: Secondary | ICD-10-CM | POA: Diagnosis not present

## 2022-08-24 DIAGNOSIS — G311 Senile degeneration of brain, not elsewhere classified: Secondary | ICD-10-CM | POA: Diagnosis not present

## 2022-08-24 DIAGNOSIS — F0284 Dementia in other diseases classified elsewhere, unspecified severity, with anxiety: Secondary | ICD-10-CM | POA: Diagnosis not present

## 2022-08-24 DIAGNOSIS — E43 Unspecified severe protein-calorie malnutrition: Secondary | ICD-10-CM | POA: Diagnosis not present

## 2022-08-24 DIAGNOSIS — Z6820 Body mass index (BMI) 20.0-20.9, adult: Secondary | ICD-10-CM | POA: Diagnosis not present

## 2022-08-26 DIAGNOSIS — G311 Senile degeneration of brain, not elsewhere classified: Secondary | ICD-10-CM | POA: Diagnosis not present

## 2022-08-26 DIAGNOSIS — F0284 Dementia in other diseases classified elsewhere, unspecified severity, with anxiety: Secondary | ICD-10-CM | POA: Diagnosis not present

## 2022-08-26 DIAGNOSIS — F0283 Dementia in other diseases classified elsewhere, unspecified severity, with mood disturbance: Secondary | ICD-10-CM | POA: Diagnosis not present

## 2022-08-26 DIAGNOSIS — R296 Repeated falls: Secondary | ICD-10-CM | POA: Diagnosis not present

## 2022-08-26 DIAGNOSIS — E43 Unspecified severe protein-calorie malnutrition: Secondary | ICD-10-CM | POA: Diagnosis not present

## 2022-08-26 DIAGNOSIS — Z6820 Body mass index (BMI) 20.0-20.9, adult: Secondary | ICD-10-CM | POA: Diagnosis not present

## 2022-08-29 DIAGNOSIS — I10 Essential (primary) hypertension: Secondary | ICD-10-CM | POA: Diagnosis not present

## 2022-08-29 DIAGNOSIS — R21 Rash and other nonspecific skin eruption: Secondary | ICD-10-CM | POA: Diagnosis not present

## 2022-08-29 DIAGNOSIS — F0280 Dementia in other diseases classified elsewhere without behavioral disturbance: Secondary | ICD-10-CM | POA: Diagnosis not present

## 2022-08-29 DIAGNOSIS — I480 Paroxysmal atrial fibrillation: Secondary | ICD-10-CM | POA: Diagnosis not present

## 2022-08-29 DIAGNOSIS — M150 Primary generalized (osteo)arthritis: Secondary | ICD-10-CM | POA: Diagnosis not present

## 2022-08-30 DIAGNOSIS — Z6820 Body mass index (BMI) 20.0-20.9, adult: Secondary | ICD-10-CM | POA: Diagnosis not present

## 2022-08-30 DIAGNOSIS — R296 Repeated falls: Secondary | ICD-10-CM | POA: Diagnosis not present

## 2022-08-30 DIAGNOSIS — F0284 Dementia in other diseases classified elsewhere, unspecified severity, with anxiety: Secondary | ICD-10-CM | POA: Diagnosis not present

## 2022-08-30 DIAGNOSIS — F0283 Dementia in other diseases classified elsewhere, unspecified severity, with mood disturbance: Secondary | ICD-10-CM | POA: Diagnosis not present

## 2022-08-30 DIAGNOSIS — E43 Unspecified severe protein-calorie malnutrition: Secondary | ICD-10-CM | POA: Diagnosis not present

## 2022-08-30 DIAGNOSIS — G311 Senile degeneration of brain, not elsewhere classified: Secondary | ICD-10-CM | POA: Diagnosis not present

## 2022-08-31 DIAGNOSIS — G311 Senile degeneration of brain, not elsewhere classified: Secondary | ICD-10-CM | POA: Diagnosis not present

## 2022-08-31 DIAGNOSIS — F0284 Dementia in other diseases classified elsewhere, unspecified severity, with anxiety: Secondary | ICD-10-CM | POA: Diagnosis not present

## 2022-08-31 DIAGNOSIS — F0283 Dementia in other diseases classified elsewhere, unspecified severity, with mood disturbance: Secondary | ICD-10-CM | POA: Diagnosis not present

## 2022-08-31 DIAGNOSIS — E43 Unspecified severe protein-calorie malnutrition: Secondary | ICD-10-CM | POA: Diagnosis not present

## 2022-08-31 DIAGNOSIS — R296 Repeated falls: Secondary | ICD-10-CM | POA: Diagnosis not present

## 2022-08-31 DIAGNOSIS — Z6820 Body mass index (BMI) 20.0-20.9, adult: Secondary | ICD-10-CM | POA: Diagnosis not present

## 2022-09-02 DIAGNOSIS — F0283 Dementia in other diseases classified elsewhere, unspecified severity, with mood disturbance: Secondary | ICD-10-CM | POA: Diagnosis not present

## 2022-09-02 DIAGNOSIS — Z6820 Body mass index (BMI) 20.0-20.9, adult: Secondary | ICD-10-CM | POA: Diagnosis not present

## 2022-09-02 DIAGNOSIS — F0284 Dementia in other diseases classified elsewhere, unspecified severity, with anxiety: Secondary | ICD-10-CM | POA: Diagnosis not present

## 2022-09-02 DIAGNOSIS — E43 Unspecified severe protein-calorie malnutrition: Secondary | ICD-10-CM | POA: Diagnosis not present

## 2022-09-02 DIAGNOSIS — R296 Repeated falls: Secondary | ICD-10-CM | POA: Diagnosis not present

## 2022-09-02 DIAGNOSIS — G311 Senile degeneration of brain, not elsewhere classified: Secondary | ICD-10-CM | POA: Diagnosis not present

## 2022-09-03 DIAGNOSIS — R296 Repeated falls: Secondary | ICD-10-CM | POA: Diagnosis not present

## 2022-09-03 DIAGNOSIS — G311 Senile degeneration of brain, not elsewhere classified: Secondary | ICD-10-CM | POA: Diagnosis not present

## 2022-09-03 DIAGNOSIS — F0284 Dementia in other diseases classified elsewhere, unspecified severity, with anxiety: Secondary | ICD-10-CM | POA: Diagnosis not present

## 2022-09-03 DIAGNOSIS — F0283 Dementia in other diseases classified elsewhere, unspecified severity, with mood disturbance: Secondary | ICD-10-CM | POA: Diagnosis not present

## 2022-09-03 DIAGNOSIS — E43 Unspecified severe protein-calorie malnutrition: Secondary | ICD-10-CM | POA: Diagnosis not present

## 2022-09-03 DIAGNOSIS — Z6820 Body mass index (BMI) 20.0-20.9, adult: Secondary | ICD-10-CM | POA: Diagnosis not present

## 2022-09-05 DIAGNOSIS — F0284 Dementia in other diseases classified elsewhere, unspecified severity, with anxiety: Secondary | ICD-10-CM | POA: Diagnosis not present

## 2022-09-05 DIAGNOSIS — E43 Unspecified severe protein-calorie malnutrition: Secondary | ICD-10-CM | POA: Diagnosis not present

## 2022-09-05 DIAGNOSIS — Z6820 Body mass index (BMI) 20.0-20.9, adult: Secondary | ICD-10-CM | POA: Diagnosis not present

## 2022-09-05 DIAGNOSIS — R296 Repeated falls: Secondary | ICD-10-CM | POA: Diagnosis not present

## 2022-09-05 DIAGNOSIS — F0283 Dementia in other diseases classified elsewhere, unspecified severity, with mood disturbance: Secondary | ICD-10-CM | POA: Diagnosis not present

## 2022-09-05 DIAGNOSIS — G311 Senile degeneration of brain, not elsewhere classified: Secondary | ICD-10-CM | POA: Diagnosis not present

## 2022-09-06 DIAGNOSIS — Z6820 Body mass index (BMI) 20.0-20.9, adult: Secondary | ICD-10-CM | POA: Diagnosis not present

## 2022-09-06 DIAGNOSIS — F0283 Dementia in other diseases classified elsewhere, unspecified severity, with mood disturbance: Secondary | ICD-10-CM | POA: Diagnosis not present

## 2022-09-06 DIAGNOSIS — F0284 Dementia in other diseases classified elsewhere, unspecified severity, with anxiety: Secondary | ICD-10-CM | POA: Diagnosis not present

## 2022-09-06 DIAGNOSIS — R296 Repeated falls: Secondary | ICD-10-CM | POA: Diagnosis not present

## 2022-09-06 DIAGNOSIS — G311 Senile degeneration of brain, not elsewhere classified: Secondary | ICD-10-CM | POA: Diagnosis not present

## 2022-09-06 DIAGNOSIS — E43 Unspecified severe protein-calorie malnutrition: Secondary | ICD-10-CM | POA: Diagnosis not present

## 2022-09-08 DIAGNOSIS — F0283 Dementia in other diseases classified elsewhere, unspecified severity, with mood disturbance: Secondary | ICD-10-CM | POA: Diagnosis not present

## 2022-09-08 DIAGNOSIS — G311 Senile degeneration of brain, not elsewhere classified: Secondary | ICD-10-CM | POA: Diagnosis not present

## 2022-09-08 DIAGNOSIS — Z6820 Body mass index (BMI) 20.0-20.9, adult: Secondary | ICD-10-CM | POA: Diagnosis not present

## 2022-09-08 DIAGNOSIS — E43 Unspecified severe protein-calorie malnutrition: Secondary | ICD-10-CM | POA: Diagnosis not present

## 2022-09-08 DIAGNOSIS — F0284 Dementia in other diseases classified elsewhere, unspecified severity, with anxiety: Secondary | ICD-10-CM | POA: Diagnosis not present

## 2022-09-08 DIAGNOSIS — R296 Repeated falls: Secondary | ICD-10-CM | POA: Diagnosis not present

## 2022-09-09 DIAGNOSIS — Z6820 Body mass index (BMI) 20.0-20.9, adult: Secondary | ICD-10-CM | POA: Diagnosis not present

## 2022-09-09 DIAGNOSIS — F0283 Dementia in other diseases classified elsewhere, unspecified severity, with mood disturbance: Secondary | ICD-10-CM | POA: Diagnosis not present

## 2022-09-09 DIAGNOSIS — R296 Repeated falls: Secondary | ICD-10-CM | POA: Diagnosis not present

## 2022-09-09 DIAGNOSIS — G311 Senile degeneration of brain, not elsewhere classified: Secondary | ICD-10-CM | POA: Diagnosis not present

## 2022-09-09 DIAGNOSIS — E43 Unspecified severe protein-calorie malnutrition: Secondary | ICD-10-CM | POA: Diagnosis not present

## 2022-09-09 DIAGNOSIS — F0284 Dementia in other diseases classified elsewhere, unspecified severity, with anxiety: Secondary | ICD-10-CM | POA: Diagnosis not present

## 2022-09-10 DIAGNOSIS — Z741 Need for assistance with personal care: Secondary | ICD-10-CM | POA: Diagnosis not present

## 2022-09-10 DIAGNOSIS — F0284 Dementia in other diseases classified elsewhere, unspecified severity, with anxiety: Secondary | ICD-10-CM | POA: Diagnosis not present

## 2022-09-10 DIAGNOSIS — Z8719 Personal history of other diseases of the digestive system: Secondary | ICD-10-CM | POA: Diagnosis not present

## 2022-09-10 DIAGNOSIS — Z6820 Body mass index (BMI) 20.0-20.9, adult: Secondary | ICD-10-CM | POA: Diagnosis not present

## 2022-09-10 DIAGNOSIS — E785 Hyperlipidemia, unspecified: Secondary | ICD-10-CM | POA: Diagnosis not present

## 2022-09-10 DIAGNOSIS — H409 Unspecified glaucoma: Secondary | ICD-10-CM | POA: Diagnosis not present

## 2022-09-10 DIAGNOSIS — R159 Full incontinence of feces: Secondary | ICD-10-CM | POA: Diagnosis not present

## 2022-09-10 DIAGNOSIS — R32 Unspecified urinary incontinence: Secondary | ICD-10-CM | POA: Diagnosis not present

## 2022-09-10 DIAGNOSIS — G40909 Epilepsy, unspecified, not intractable, without status epilepticus: Secondary | ICD-10-CM | POA: Diagnosis not present

## 2022-09-10 DIAGNOSIS — I4891 Unspecified atrial fibrillation: Secondary | ICD-10-CM | POA: Diagnosis not present

## 2022-09-10 DIAGNOSIS — G311 Senile degeneration of brain, not elsewhere classified: Secondary | ICD-10-CM | POA: Diagnosis not present

## 2022-09-10 DIAGNOSIS — E538 Deficiency of other specified B group vitamins: Secondary | ICD-10-CM | POA: Diagnosis not present

## 2022-09-10 DIAGNOSIS — D631 Anemia in chronic kidney disease: Secondary | ICD-10-CM | POA: Diagnosis not present

## 2022-09-10 DIAGNOSIS — N1831 Chronic kidney disease, stage 3a: Secondary | ICD-10-CM | POA: Diagnosis not present

## 2022-09-10 DIAGNOSIS — K219 Gastro-esophageal reflux disease without esophagitis: Secondary | ICD-10-CM | POA: Diagnosis not present

## 2022-09-10 DIAGNOSIS — I129 Hypertensive chronic kidney disease with stage 1 through stage 4 chronic kidney disease, or unspecified chronic kidney disease: Secondary | ICD-10-CM | POA: Diagnosis not present

## 2022-09-10 DIAGNOSIS — E43 Unspecified severe protein-calorie malnutrition: Secondary | ICD-10-CM | POA: Diagnosis not present

## 2022-09-10 DIAGNOSIS — Z87440 Personal history of urinary (tract) infections: Secondary | ICD-10-CM | POA: Diagnosis not present

## 2022-09-10 DIAGNOSIS — L299 Pruritus, unspecified: Secondary | ICD-10-CM | POA: Diagnosis not present

## 2022-09-10 DIAGNOSIS — M810 Age-related osteoporosis without current pathological fracture: Secondary | ICD-10-CM | POA: Diagnosis not present

## 2022-09-10 DIAGNOSIS — R296 Repeated falls: Secondary | ICD-10-CM | POA: Diagnosis not present

## 2022-09-10 DIAGNOSIS — F0283 Dementia in other diseases classified elsewhere, unspecified severity, with mood disturbance: Secondary | ICD-10-CM | POA: Diagnosis not present

## 2022-09-12 DIAGNOSIS — F0283 Dementia in other diseases classified elsewhere, unspecified severity, with mood disturbance: Secondary | ICD-10-CM | POA: Diagnosis not present

## 2022-09-12 DIAGNOSIS — G311 Senile degeneration of brain, not elsewhere classified: Secondary | ICD-10-CM | POA: Diagnosis not present

## 2022-09-12 DIAGNOSIS — E43 Unspecified severe protein-calorie malnutrition: Secondary | ICD-10-CM | POA: Diagnosis not present

## 2022-09-12 DIAGNOSIS — R296 Repeated falls: Secondary | ICD-10-CM | POA: Diagnosis not present

## 2022-09-12 DIAGNOSIS — F0284 Dementia in other diseases classified elsewhere, unspecified severity, with anxiety: Secondary | ICD-10-CM | POA: Diagnosis not present

## 2022-09-12 DIAGNOSIS — Z6820 Body mass index (BMI) 20.0-20.9, adult: Secondary | ICD-10-CM | POA: Diagnosis not present

## 2022-09-15 DIAGNOSIS — R296 Repeated falls: Secondary | ICD-10-CM | POA: Diagnosis not present

## 2022-09-15 DIAGNOSIS — Z6820 Body mass index (BMI) 20.0-20.9, adult: Secondary | ICD-10-CM | POA: Diagnosis not present

## 2022-09-15 DIAGNOSIS — F0283 Dementia in other diseases classified elsewhere, unspecified severity, with mood disturbance: Secondary | ICD-10-CM | POA: Diagnosis not present

## 2022-09-15 DIAGNOSIS — E43 Unspecified severe protein-calorie malnutrition: Secondary | ICD-10-CM | POA: Diagnosis not present

## 2022-09-15 DIAGNOSIS — F0284 Dementia in other diseases classified elsewhere, unspecified severity, with anxiety: Secondary | ICD-10-CM | POA: Diagnosis not present

## 2022-09-15 DIAGNOSIS — G311 Senile degeneration of brain, not elsewhere classified: Secondary | ICD-10-CM | POA: Diagnosis not present

## 2022-09-16 DIAGNOSIS — R296 Repeated falls: Secondary | ICD-10-CM | POA: Diagnosis not present

## 2022-09-16 DIAGNOSIS — E43 Unspecified severe protein-calorie malnutrition: Secondary | ICD-10-CM | POA: Diagnosis not present

## 2022-09-16 DIAGNOSIS — Z6820 Body mass index (BMI) 20.0-20.9, adult: Secondary | ICD-10-CM | POA: Diagnosis not present

## 2022-09-16 DIAGNOSIS — F0283 Dementia in other diseases classified elsewhere, unspecified severity, with mood disturbance: Secondary | ICD-10-CM | POA: Diagnosis not present

## 2022-09-16 DIAGNOSIS — F0284 Dementia in other diseases classified elsewhere, unspecified severity, with anxiety: Secondary | ICD-10-CM | POA: Diagnosis not present

## 2022-09-16 DIAGNOSIS — G311 Senile degeneration of brain, not elsewhere classified: Secondary | ICD-10-CM | POA: Diagnosis not present

## 2022-09-17 DIAGNOSIS — F0283 Dementia in other diseases classified elsewhere, unspecified severity, with mood disturbance: Secondary | ICD-10-CM | POA: Diagnosis not present

## 2022-09-17 DIAGNOSIS — R296 Repeated falls: Secondary | ICD-10-CM | POA: Diagnosis not present

## 2022-09-17 DIAGNOSIS — F0284 Dementia in other diseases classified elsewhere, unspecified severity, with anxiety: Secondary | ICD-10-CM | POA: Diagnosis not present

## 2022-09-17 DIAGNOSIS — Z6820 Body mass index (BMI) 20.0-20.9, adult: Secondary | ICD-10-CM | POA: Diagnosis not present

## 2022-09-17 DIAGNOSIS — E43 Unspecified severe protein-calorie malnutrition: Secondary | ICD-10-CM | POA: Diagnosis not present

## 2022-09-17 DIAGNOSIS — G311 Senile degeneration of brain, not elsewhere classified: Secondary | ICD-10-CM | POA: Diagnosis not present

## 2022-09-19 DIAGNOSIS — F0283 Dementia in other diseases classified elsewhere, unspecified severity, with mood disturbance: Secondary | ICD-10-CM | POA: Diagnosis not present

## 2022-09-19 DIAGNOSIS — E43 Unspecified severe protein-calorie malnutrition: Secondary | ICD-10-CM | POA: Diagnosis not present

## 2022-09-19 DIAGNOSIS — G311 Senile degeneration of brain, not elsewhere classified: Secondary | ICD-10-CM | POA: Diagnosis not present

## 2022-09-19 DIAGNOSIS — Z6820 Body mass index (BMI) 20.0-20.9, adult: Secondary | ICD-10-CM | POA: Diagnosis not present

## 2022-09-19 DIAGNOSIS — R296 Repeated falls: Secondary | ICD-10-CM | POA: Diagnosis not present

## 2022-09-19 DIAGNOSIS — F0284 Dementia in other diseases classified elsewhere, unspecified severity, with anxiety: Secondary | ICD-10-CM | POA: Diagnosis not present

## 2022-09-21 DIAGNOSIS — F0280 Dementia in other diseases classified elsewhere without behavioral disturbance: Secondary | ICD-10-CM | POA: Diagnosis not present

## 2022-09-21 DIAGNOSIS — G4089 Other seizures: Secondary | ICD-10-CM | POA: Diagnosis not present

## 2022-09-21 DIAGNOSIS — R21 Rash and other nonspecific skin eruption: Secondary | ICD-10-CM | POA: Diagnosis not present

## 2022-09-21 DIAGNOSIS — M150 Primary generalized (osteo)arthritis: Secondary | ICD-10-CM | POA: Diagnosis not present

## 2022-09-21 DIAGNOSIS — K219 Gastro-esophageal reflux disease without esophagitis: Secondary | ICD-10-CM | POA: Diagnosis not present

## 2022-09-21 DIAGNOSIS — I10 Essential (primary) hypertension: Secondary | ICD-10-CM | POA: Diagnosis not present

## 2022-09-21 DIAGNOSIS — F329 Major depressive disorder, single episode, unspecified: Secondary | ICD-10-CM | POA: Diagnosis not present

## 2022-09-22 DIAGNOSIS — E43 Unspecified severe protein-calorie malnutrition: Secondary | ICD-10-CM | POA: Diagnosis not present

## 2022-09-22 DIAGNOSIS — G311 Senile degeneration of brain, not elsewhere classified: Secondary | ICD-10-CM | POA: Diagnosis not present

## 2022-09-22 DIAGNOSIS — F0284 Dementia in other diseases classified elsewhere, unspecified severity, with anxiety: Secondary | ICD-10-CM | POA: Diagnosis not present

## 2022-09-22 DIAGNOSIS — Z6820 Body mass index (BMI) 20.0-20.9, adult: Secondary | ICD-10-CM | POA: Diagnosis not present

## 2022-09-22 DIAGNOSIS — F0283 Dementia in other diseases classified elsewhere, unspecified severity, with mood disturbance: Secondary | ICD-10-CM | POA: Diagnosis not present

## 2022-09-22 DIAGNOSIS — R296 Repeated falls: Secondary | ICD-10-CM | POA: Diagnosis not present

## 2022-09-23 DIAGNOSIS — F0284 Dementia in other diseases classified elsewhere, unspecified severity, with anxiety: Secondary | ICD-10-CM | POA: Diagnosis not present

## 2022-09-23 DIAGNOSIS — R296 Repeated falls: Secondary | ICD-10-CM | POA: Diagnosis not present

## 2022-09-23 DIAGNOSIS — F0283 Dementia in other diseases classified elsewhere, unspecified severity, with mood disturbance: Secondary | ICD-10-CM | POA: Diagnosis not present

## 2022-09-23 DIAGNOSIS — F0280 Dementia in other diseases classified elsewhere without behavioral disturbance: Secondary | ICD-10-CM | POA: Diagnosis not present

## 2022-09-23 DIAGNOSIS — Z6820 Body mass index (BMI) 20.0-20.9, adult: Secondary | ICD-10-CM | POA: Diagnosis not present

## 2022-09-23 DIAGNOSIS — E43 Unspecified severe protein-calorie malnutrition: Secondary | ICD-10-CM | POA: Diagnosis not present

## 2022-09-23 DIAGNOSIS — I10 Essential (primary) hypertension: Secondary | ICD-10-CM | POA: Diagnosis not present

## 2022-09-23 DIAGNOSIS — M150 Primary generalized (osteo)arthritis: Secondary | ICD-10-CM | POA: Diagnosis not present

## 2022-09-23 DIAGNOSIS — I480 Paroxysmal atrial fibrillation: Secondary | ICD-10-CM | POA: Diagnosis not present

## 2022-09-23 DIAGNOSIS — G311 Senile degeneration of brain, not elsewhere classified: Secondary | ICD-10-CM | POA: Diagnosis not present

## 2022-09-24 DIAGNOSIS — E43 Unspecified severe protein-calorie malnutrition: Secondary | ICD-10-CM | POA: Diagnosis not present

## 2022-09-24 DIAGNOSIS — F0284 Dementia in other diseases classified elsewhere, unspecified severity, with anxiety: Secondary | ICD-10-CM | POA: Diagnosis not present

## 2022-09-24 DIAGNOSIS — F0283 Dementia in other diseases classified elsewhere, unspecified severity, with mood disturbance: Secondary | ICD-10-CM | POA: Diagnosis not present

## 2022-09-24 DIAGNOSIS — R296 Repeated falls: Secondary | ICD-10-CM | POA: Diagnosis not present

## 2022-09-24 DIAGNOSIS — G311 Senile degeneration of brain, not elsewhere classified: Secondary | ICD-10-CM | POA: Diagnosis not present

## 2022-09-24 DIAGNOSIS — Z6820 Body mass index (BMI) 20.0-20.9, adult: Secondary | ICD-10-CM | POA: Diagnosis not present

## 2022-09-26 DIAGNOSIS — G311 Senile degeneration of brain, not elsewhere classified: Secondary | ICD-10-CM | POA: Diagnosis not present

## 2022-09-26 DIAGNOSIS — F0283 Dementia in other diseases classified elsewhere, unspecified severity, with mood disturbance: Secondary | ICD-10-CM | POA: Diagnosis not present

## 2022-09-26 DIAGNOSIS — F0284 Dementia in other diseases classified elsewhere, unspecified severity, with anxiety: Secondary | ICD-10-CM | POA: Diagnosis not present

## 2022-09-26 DIAGNOSIS — Z6820 Body mass index (BMI) 20.0-20.9, adult: Secondary | ICD-10-CM | POA: Diagnosis not present

## 2022-09-26 DIAGNOSIS — R296 Repeated falls: Secondary | ICD-10-CM | POA: Diagnosis not present

## 2022-09-26 DIAGNOSIS — E43 Unspecified severe protein-calorie malnutrition: Secondary | ICD-10-CM | POA: Diagnosis not present

## 2022-09-29 DIAGNOSIS — F0283 Dementia in other diseases classified elsewhere, unspecified severity, with mood disturbance: Secondary | ICD-10-CM | POA: Diagnosis not present

## 2022-09-29 DIAGNOSIS — E43 Unspecified severe protein-calorie malnutrition: Secondary | ICD-10-CM | POA: Diagnosis not present

## 2022-09-29 DIAGNOSIS — Z6820 Body mass index (BMI) 20.0-20.9, adult: Secondary | ICD-10-CM | POA: Diagnosis not present

## 2022-09-29 DIAGNOSIS — F0284 Dementia in other diseases classified elsewhere, unspecified severity, with anxiety: Secondary | ICD-10-CM | POA: Diagnosis not present

## 2022-09-29 DIAGNOSIS — R296 Repeated falls: Secondary | ICD-10-CM | POA: Diagnosis not present

## 2022-09-29 DIAGNOSIS — G311 Senile degeneration of brain, not elsewhere classified: Secondary | ICD-10-CM | POA: Diagnosis not present

## 2022-09-30 DIAGNOSIS — Z6820 Body mass index (BMI) 20.0-20.9, adult: Secondary | ICD-10-CM | POA: Diagnosis not present

## 2022-09-30 DIAGNOSIS — F0283 Dementia in other diseases classified elsewhere, unspecified severity, with mood disturbance: Secondary | ICD-10-CM | POA: Diagnosis not present

## 2022-09-30 DIAGNOSIS — G311 Senile degeneration of brain, not elsewhere classified: Secondary | ICD-10-CM | POA: Diagnosis not present

## 2022-09-30 DIAGNOSIS — E43 Unspecified severe protein-calorie malnutrition: Secondary | ICD-10-CM | POA: Diagnosis not present

## 2022-09-30 DIAGNOSIS — F0284 Dementia in other diseases classified elsewhere, unspecified severity, with anxiety: Secondary | ICD-10-CM | POA: Diagnosis not present

## 2022-09-30 DIAGNOSIS — R296 Repeated falls: Secondary | ICD-10-CM | POA: Diagnosis not present

## 2022-10-01 DIAGNOSIS — E43 Unspecified severe protein-calorie malnutrition: Secondary | ICD-10-CM | POA: Diagnosis not present

## 2022-10-01 DIAGNOSIS — F0283 Dementia in other diseases classified elsewhere, unspecified severity, with mood disturbance: Secondary | ICD-10-CM | POA: Diagnosis not present

## 2022-10-01 DIAGNOSIS — R296 Repeated falls: Secondary | ICD-10-CM | POA: Diagnosis not present

## 2022-10-01 DIAGNOSIS — F0284 Dementia in other diseases classified elsewhere, unspecified severity, with anxiety: Secondary | ICD-10-CM | POA: Diagnosis not present

## 2022-10-01 DIAGNOSIS — G311 Senile degeneration of brain, not elsewhere classified: Secondary | ICD-10-CM | POA: Diagnosis not present

## 2022-10-01 DIAGNOSIS — Z6820 Body mass index (BMI) 20.0-20.9, adult: Secondary | ICD-10-CM | POA: Diagnosis not present

## 2022-10-03 DIAGNOSIS — F0284 Dementia in other diseases classified elsewhere, unspecified severity, with anxiety: Secondary | ICD-10-CM | POA: Diagnosis not present

## 2022-10-03 DIAGNOSIS — F0283 Dementia in other diseases classified elsewhere, unspecified severity, with mood disturbance: Secondary | ICD-10-CM | POA: Diagnosis not present

## 2022-10-03 DIAGNOSIS — R296 Repeated falls: Secondary | ICD-10-CM | POA: Diagnosis not present

## 2022-10-03 DIAGNOSIS — E43 Unspecified severe protein-calorie malnutrition: Secondary | ICD-10-CM | POA: Diagnosis not present

## 2022-10-03 DIAGNOSIS — Z6820 Body mass index (BMI) 20.0-20.9, adult: Secondary | ICD-10-CM | POA: Diagnosis not present

## 2022-10-03 DIAGNOSIS — G311 Senile degeneration of brain, not elsewhere classified: Secondary | ICD-10-CM | POA: Diagnosis not present

## 2022-10-06 DIAGNOSIS — E43 Unspecified severe protein-calorie malnutrition: Secondary | ICD-10-CM | POA: Diagnosis not present

## 2022-10-06 DIAGNOSIS — F0283 Dementia in other diseases classified elsewhere, unspecified severity, with mood disturbance: Secondary | ICD-10-CM | POA: Diagnosis not present

## 2022-10-06 DIAGNOSIS — R296 Repeated falls: Secondary | ICD-10-CM | POA: Diagnosis not present

## 2022-10-06 DIAGNOSIS — G311 Senile degeneration of brain, not elsewhere classified: Secondary | ICD-10-CM | POA: Diagnosis not present

## 2022-10-06 DIAGNOSIS — F0284 Dementia in other diseases classified elsewhere, unspecified severity, with anxiety: Secondary | ICD-10-CM | POA: Diagnosis not present

## 2022-10-06 DIAGNOSIS — Z6820 Body mass index (BMI) 20.0-20.9, adult: Secondary | ICD-10-CM | POA: Diagnosis not present

## 2022-10-08 DIAGNOSIS — F0283 Dementia in other diseases classified elsewhere, unspecified severity, with mood disturbance: Secondary | ICD-10-CM | POA: Diagnosis not present

## 2022-10-08 DIAGNOSIS — F0284 Dementia in other diseases classified elsewhere, unspecified severity, with anxiety: Secondary | ICD-10-CM | POA: Diagnosis not present

## 2022-10-08 DIAGNOSIS — E43 Unspecified severe protein-calorie malnutrition: Secondary | ICD-10-CM | POA: Diagnosis not present

## 2022-10-08 DIAGNOSIS — G311 Senile degeneration of brain, not elsewhere classified: Secondary | ICD-10-CM | POA: Diagnosis not present

## 2022-10-08 DIAGNOSIS — Z6820 Body mass index (BMI) 20.0-20.9, adult: Secondary | ICD-10-CM | POA: Diagnosis not present

## 2022-10-08 DIAGNOSIS — R296 Repeated falls: Secondary | ICD-10-CM | POA: Diagnosis not present

## 2022-10-09 DIAGNOSIS — I480 Paroxysmal atrial fibrillation: Secondary | ICD-10-CM | POA: Diagnosis not present

## 2022-10-09 DIAGNOSIS — I10 Essential (primary) hypertension: Secondary | ICD-10-CM | POA: Diagnosis not present

## 2022-10-09 DIAGNOSIS — F329 Major depressive disorder, single episode, unspecified: Secondary | ICD-10-CM | POA: Diagnosis not present

## 2022-10-09 DIAGNOSIS — F0280 Dementia in other diseases classified elsewhere without behavioral disturbance: Secondary | ICD-10-CM | POA: Diagnosis not present

## 2022-10-09 DIAGNOSIS — K219 Gastro-esophageal reflux disease without esophagitis: Secondary | ICD-10-CM | POA: Diagnosis not present

## 2022-10-10 DIAGNOSIS — Z87440 Personal history of urinary (tract) infections: Secondary | ICD-10-CM | POA: Diagnosis not present

## 2022-10-10 DIAGNOSIS — G311 Senile degeneration of brain, not elsewhere classified: Secondary | ICD-10-CM | POA: Diagnosis not present

## 2022-10-10 DIAGNOSIS — I129 Hypertensive chronic kidney disease with stage 1 through stage 4 chronic kidney disease, or unspecified chronic kidney disease: Secondary | ICD-10-CM | POA: Diagnosis not present

## 2022-10-10 DIAGNOSIS — F0284 Dementia in other diseases classified elsewhere, unspecified severity, with anxiety: Secondary | ICD-10-CM | POA: Diagnosis not present

## 2022-10-10 DIAGNOSIS — R296 Repeated falls: Secondary | ICD-10-CM | POA: Diagnosis not present

## 2022-10-10 DIAGNOSIS — F0283 Dementia in other diseases classified elsewhere, unspecified severity, with mood disturbance: Secondary | ICD-10-CM | POA: Diagnosis not present

## 2022-10-10 DIAGNOSIS — Z6820 Body mass index (BMI) 20.0-20.9, adult: Secondary | ICD-10-CM | POA: Diagnosis not present

## 2022-10-10 DIAGNOSIS — E538 Deficiency of other specified B group vitamins: Secondary | ICD-10-CM | POA: Diagnosis not present

## 2022-10-10 DIAGNOSIS — L299 Pruritus, unspecified: Secondary | ICD-10-CM | POA: Diagnosis not present

## 2022-10-10 DIAGNOSIS — R32 Unspecified urinary incontinence: Secondary | ICD-10-CM | POA: Diagnosis not present

## 2022-10-10 DIAGNOSIS — E785 Hyperlipidemia, unspecified: Secondary | ICD-10-CM | POA: Diagnosis not present

## 2022-10-10 DIAGNOSIS — I4891 Unspecified atrial fibrillation: Secondary | ICD-10-CM | POA: Diagnosis not present

## 2022-10-10 DIAGNOSIS — M810 Age-related osteoporosis without current pathological fracture: Secondary | ICD-10-CM | POA: Diagnosis not present

## 2022-10-10 DIAGNOSIS — Z741 Need for assistance with personal care: Secondary | ICD-10-CM | POA: Diagnosis not present

## 2022-10-10 DIAGNOSIS — G40909 Epilepsy, unspecified, not intractable, without status epilepticus: Secondary | ICD-10-CM | POA: Diagnosis not present

## 2022-10-10 DIAGNOSIS — D631 Anemia in chronic kidney disease: Secondary | ICD-10-CM | POA: Diagnosis not present

## 2022-10-10 DIAGNOSIS — H409 Unspecified glaucoma: Secondary | ICD-10-CM | POA: Diagnosis not present

## 2022-10-10 DIAGNOSIS — R159 Full incontinence of feces: Secondary | ICD-10-CM | POA: Diagnosis not present

## 2022-10-10 DIAGNOSIS — K219 Gastro-esophageal reflux disease without esophagitis: Secondary | ICD-10-CM | POA: Diagnosis not present

## 2022-10-10 DIAGNOSIS — E43 Unspecified severe protein-calorie malnutrition: Secondary | ICD-10-CM | POA: Diagnosis not present

## 2022-10-10 DIAGNOSIS — Z8719 Personal history of other diseases of the digestive system: Secondary | ICD-10-CM | POA: Diagnosis not present

## 2022-10-10 DIAGNOSIS — N1831 Chronic kidney disease, stage 3a: Secondary | ICD-10-CM | POA: Diagnosis not present

## 2022-10-13 DIAGNOSIS — G311 Senile degeneration of brain, not elsewhere classified: Secondary | ICD-10-CM | POA: Diagnosis not present

## 2022-10-13 DIAGNOSIS — F0284 Dementia in other diseases classified elsewhere, unspecified severity, with anxiety: Secondary | ICD-10-CM | POA: Diagnosis not present

## 2022-10-13 DIAGNOSIS — E43 Unspecified severe protein-calorie malnutrition: Secondary | ICD-10-CM | POA: Diagnosis not present

## 2022-10-13 DIAGNOSIS — R296 Repeated falls: Secondary | ICD-10-CM | POA: Diagnosis not present

## 2022-10-13 DIAGNOSIS — Z6820 Body mass index (BMI) 20.0-20.9, adult: Secondary | ICD-10-CM | POA: Diagnosis not present

## 2022-10-13 DIAGNOSIS — F0283 Dementia in other diseases classified elsewhere, unspecified severity, with mood disturbance: Secondary | ICD-10-CM | POA: Diagnosis not present

## 2022-10-15 DIAGNOSIS — E43 Unspecified severe protein-calorie malnutrition: Secondary | ICD-10-CM | POA: Diagnosis not present

## 2022-10-15 DIAGNOSIS — Z6820 Body mass index (BMI) 20.0-20.9, adult: Secondary | ICD-10-CM | POA: Diagnosis not present

## 2022-10-15 DIAGNOSIS — R296 Repeated falls: Secondary | ICD-10-CM | POA: Diagnosis not present

## 2022-10-15 DIAGNOSIS — G311 Senile degeneration of brain, not elsewhere classified: Secondary | ICD-10-CM | POA: Diagnosis not present

## 2022-10-15 DIAGNOSIS — F0284 Dementia in other diseases classified elsewhere, unspecified severity, with anxiety: Secondary | ICD-10-CM | POA: Diagnosis not present

## 2022-10-15 DIAGNOSIS — F0283 Dementia in other diseases classified elsewhere, unspecified severity, with mood disturbance: Secondary | ICD-10-CM | POA: Diagnosis not present

## 2022-10-17 DIAGNOSIS — F0283 Dementia in other diseases classified elsewhere, unspecified severity, with mood disturbance: Secondary | ICD-10-CM | POA: Diagnosis not present

## 2022-10-17 DIAGNOSIS — G40909 Epilepsy, unspecified, not intractable, without status epilepticus: Secondary | ICD-10-CM | POA: Diagnosis not present

## 2022-10-17 DIAGNOSIS — F0390 Unspecified dementia without behavioral disturbance: Secondary | ICD-10-CM | POA: Diagnosis not present

## 2022-10-17 DIAGNOSIS — E43 Unspecified severe protein-calorie malnutrition: Secondary | ICD-10-CM | POA: Diagnosis not present

## 2022-10-17 DIAGNOSIS — D649 Anemia, unspecified: Secondary | ICD-10-CM | POA: Diagnosis not present

## 2022-10-17 DIAGNOSIS — G9340 Encephalopathy, unspecified: Secondary | ICD-10-CM | POA: Diagnosis not present

## 2022-10-17 DIAGNOSIS — R296 Repeated falls: Secondary | ICD-10-CM | POA: Diagnosis not present

## 2022-10-17 DIAGNOSIS — F0284 Dementia in other diseases classified elsewhere, unspecified severity, with anxiety: Secondary | ICD-10-CM | POA: Diagnosis not present

## 2022-10-17 DIAGNOSIS — G311 Senile degeneration of brain, not elsewhere classified: Secondary | ICD-10-CM | POA: Diagnosis not present

## 2022-10-17 DIAGNOSIS — Z6820 Body mass index (BMI) 20.0-20.9, adult: Secondary | ICD-10-CM | POA: Diagnosis not present

## 2022-10-20 DIAGNOSIS — F0284 Dementia in other diseases classified elsewhere, unspecified severity, with anxiety: Secondary | ICD-10-CM | POA: Diagnosis not present

## 2022-10-20 DIAGNOSIS — F0283 Dementia in other diseases classified elsewhere, unspecified severity, with mood disturbance: Secondary | ICD-10-CM | POA: Diagnosis not present

## 2022-10-20 DIAGNOSIS — R296 Repeated falls: Secondary | ICD-10-CM | POA: Diagnosis not present

## 2022-10-20 DIAGNOSIS — Z6820 Body mass index (BMI) 20.0-20.9, adult: Secondary | ICD-10-CM | POA: Diagnosis not present

## 2022-10-20 DIAGNOSIS — E43 Unspecified severe protein-calorie malnutrition: Secondary | ICD-10-CM | POA: Diagnosis not present

## 2022-10-20 DIAGNOSIS — G311 Senile degeneration of brain, not elsewhere classified: Secondary | ICD-10-CM | POA: Diagnosis not present

## 2022-10-22 DIAGNOSIS — R296 Repeated falls: Secondary | ICD-10-CM | POA: Diagnosis not present

## 2022-10-22 DIAGNOSIS — G311 Senile degeneration of brain, not elsewhere classified: Secondary | ICD-10-CM | POA: Diagnosis not present

## 2022-10-22 DIAGNOSIS — F0284 Dementia in other diseases classified elsewhere, unspecified severity, with anxiety: Secondary | ICD-10-CM | POA: Diagnosis not present

## 2022-10-22 DIAGNOSIS — E43 Unspecified severe protein-calorie malnutrition: Secondary | ICD-10-CM | POA: Diagnosis not present

## 2022-10-22 DIAGNOSIS — Z6820 Body mass index (BMI) 20.0-20.9, adult: Secondary | ICD-10-CM | POA: Diagnosis not present

## 2022-10-22 DIAGNOSIS — F0283 Dementia in other diseases classified elsewhere, unspecified severity, with mood disturbance: Secondary | ICD-10-CM | POA: Diagnosis not present

## 2022-10-24 DIAGNOSIS — F0283 Dementia in other diseases classified elsewhere, unspecified severity, with mood disturbance: Secondary | ICD-10-CM | POA: Diagnosis not present

## 2022-10-24 DIAGNOSIS — Z6820 Body mass index (BMI) 20.0-20.9, adult: Secondary | ICD-10-CM | POA: Diagnosis not present

## 2022-10-24 DIAGNOSIS — R296 Repeated falls: Secondary | ICD-10-CM | POA: Diagnosis not present

## 2022-10-24 DIAGNOSIS — G311 Senile degeneration of brain, not elsewhere classified: Secondary | ICD-10-CM | POA: Diagnosis not present

## 2022-10-24 DIAGNOSIS — E43 Unspecified severe protein-calorie malnutrition: Secondary | ICD-10-CM | POA: Diagnosis not present

## 2022-10-24 DIAGNOSIS — F0284 Dementia in other diseases classified elsewhere, unspecified severity, with anxiety: Secondary | ICD-10-CM | POA: Diagnosis not present

## 2022-10-27 DIAGNOSIS — Z6820 Body mass index (BMI) 20.0-20.9, adult: Secondary | ICD-10-CM | POA: Diagnosis not present

## 2022-10-27 DIAGNOSIS — R296 Repeated falls: Secondary | ICD-10-CM | POA: Diagnosis not present

## 2022-10-27 DIAGNOSIS — F0283 Dementia in other diseases classified elsewhere, unspecified severity, with mood disturbance: Secondary | ICD-10-CM | POA: Diagnosis not present

## 2022-10-27 DIAGNOSIS — E43 Unspecified severe protein-calorie malnutrition: Secondary | ICD-10-CM | POA: Diagnosis not present

## 2022-10-27 DIAGNOSIS — F0284 Dementia in other diseases classified elsewhere, unspecified severity, with anxiety: Secondary | ICD-10-CM | POA: Diagnosis not present

## 2022-10-27 DIAGNOSIS — G311 Senile degeneration of brain, not elsewhere classified: Secondary | ICD-10-CM | POA: Diagnosis not present

## 2022-10-29 DIAGNOSIS — R296 Repeated falls: Secondary | ICD-10-CM | POA: Diagnosis not present

## 2022-10-29 DIAGNOSIS — Z6820 Body mass index (BMI) 20.0-20.9, adult: Secondary | ICD-10-CM | POA: Diagnosis not present

## 2022-10-29 DIAGNOSIS — F0283 Dementia in other diseases classified elsewhere, unspecified severity, with mood disturbance: Secondary | ICD-10-CM | POA: Diagnosis not present

## 2022-10-29 DIAGNOSIS — E43 Unspecified severe protein-calorie malnutrition: Secondary | ICD-10-CM | POA: Diagnosis not present

## 2022-10-29 DIAGNOSIS — G311 Senile degeneration of brain, not elsewhere classified: Secondary | ICD-10-CM | POA: Diagnosis not present

## 2022-10-29 DIAGNOSIS — F0284 Dementia in other diseases classified elsewhere, unspecified severity, with anxiety: Secondary | ICD-10-CM | POA: Diagnosis not present

## 2022-10-30 DIAGNOSIS — R296 Repeated falls: Secondary | ICD-10-CM | POA: Diagnosis not present

## 2022-10-30 DIAGNOSIS — G311 Senile degeneration of brain, not elsewhere classified: Secondary | ICD-10-CM | POA: Diagnosis not present

## 2022-10-30 DIAGNOSIS — Z6820 Body mass index (BMI) 20.0-20.9, adult: Secondary | ICD-10-CM | POA: Diagnosis not present

## 2022-10-30 DIAGNOSIS — F0284 Dementia in other diseases classified elsewhere, unspecified severity, with anxiety: Secondary | ICD-10-CM | POA: Diagnosis not present

## 2022-10-30 DIAGNOSIS — E43 Unspecified severe protein-calorie malnutrition: Secondary | ICD-10-CM | POA: Diagnosis not present

## 2022-10-30 DIAGNOSIS — F0283 Dementia in other diseases classified elsewhere, unspecified severity, with mood disturbance: Secondary | ICD-10-CM | POA: Diagnosis not present

## 2022-10-31 DIAGNOSIS — F0284 Dementia in other diseases classified elsewhere, unspecified severity, with anxiety: Secondary | ICD-10-CM | POA: Diagnosis not present

## 2022-10-31 DIAGNOSIS — E43 Unspecified severe protein-calorie malnutrition: Secondary | ICD-10-CM | POA: Diagnosis not present

## 2022-10-31 DIAGNOSIS — G311 Senile degeneration of brain, not elsewhere classified: Secondary | ICD-10-CM | POA: Diagnosis not present

## 2022-10-31 DIAGNOSIS — F0283 Dementia in other diseases classified elsewhere, unspecified severity, with mood disturbance: Secondary | ICD-10-CM | POA: Diagnosis not present

## 2022-10-31 DIAGNOSIS — R296 Repeated falls: Secondary | ICD-10-CM | POA: Diagnosis not present

## 2022-10-31 DIAGNOSIS — Z6820 Body mass index (BMI) 20.0-20.9, adult: Secondary | ICD-10-CM | POA: Diagnosis not present

## 2022-11-01 DIAGNOSIS — I480 Paroxysmal atrial fibrillation: Secondary | ICD-10-CM | POA: Diagnosis not present

## 2022-11-01 DIAGNOSIS — F329 Major depressive disorder, single episode, unspecified: Secondary | ICD-10-CM | POA: Diagnosis not present

## 2022-11-01 DIAGNOSIS — I10 Essential (primary) hypertension: Secondary | ICD-10-CM | POA: Diagnosis not present

## 2022-11-01 DIAGNOSIS — K5909 Other constipation: Secondary | ICD-10-CM | POA: Diagnosis not present

## 2022-11-01 DIAGNOSIS — G4089 Other seizures: Secondary | ICD-10-CM | POA: Diagnosis not present

## 2022-11-01 DIAGNOSIS — F0280 Dementia in other diseases classified elsewhere without behavioral disturbance: Secondary | ICD-10-CM | POA: Diagnosis not present

## 2022-11-03 DIAGNOSIS — G311 Senile degeneration of brain, not elsewhere classified: Secondary | ICD-10-CM | POA: Diagnosis not present

## 2022-11-03 DIAGNOSIS — Z6820 Body mass index (BMI) 20.0-20.9, adult: Secondary | ICD-10-CM | POA: Diagnosis not present

## 2022-11-03 DIAGNOSIS — E43 Unspecified severe protein-calorie malnutrition: Secondary | ICD-10-CM | POA: Diagnosis not present

## 2022-11-03 DIAGNOSIS — F0284 Dementia in other diseases classified elsewhere, unspecified severity, with anxiety: Secondary | ICD-10-CM | POA: Diagnosis not present

## 2022-11-03 DIAGNOSIS — F0283 Dementia in other diseases classified elsewhere, unspecified severity, with mood disturbance: Secondary | ICD-10-CM | POA: Diagnosis not present

## 2022-11-03 DIAGNOSIS — R296 Repeated falls: Secondary | ICD-10-CM | POA: Diagnosis not present

## 2022-11-05 DIAGNOSIS — G311 Senile degeneration of brain, not elsewhere classified: Secondary | ICD-10-CM | POA: Diagnosis not present

## 2022-11-05 DIAGNOSIS — Z6820 Body mass index (BMI) 20.0-20.9, adult: Secondary | ICD-10-CM | POA: Diagnosis not present

## 2022-11-05 DIAGNOSIS — F0283 Dementia in other diseases classified elsewhere, unspecified severity, with mood disturbance: Secondary | ICD-10-CM | POA: Diagnosis not present

## 2022-11-05 DIAGNOSIS — R296 Repeated falls: Secondary | ICD-10-CM | POA: Diagnosis not present

## 2022-11-05 DIAGNOSIS — E43 Unspecified severe protein-calorie malnutrition: Secondary | ICD-10-CM | POA: Diagnosis not present

## 2022-11-05 DIAGNOSIS — F0284 Dementia in other diseases classified elsewhere, unspecified severity, with anxiety: Secondary | ICD-10-CM | POA: Diagnosis not present

## 2022-11-06 DIAGNOSIS — M150 Primary generalized (osteo)arthritis: Secondary | ICD-10-CM | POA: Diagnosis not present

## 2022-11-06 DIAGNOSIS — I10 Essential (primary) hypertension: Secondary | ICD-10-CM | POA: Diagnosis not present

## 2022-11-06 DIAGNOSIS — F0280 Dementia in other diseases classified elsewhere without behavioral disturbance: Secondary | ICD-10-CM | POA: Diagnosis not present

## 2022-11-07 DIAGNOSIS — Z6820 Body mass index (BMI) 20.0-20.9, adult: Secondary | ICD-10-CM | POA: Diagnosis not present

## 2022-11-07 DIAGNOSIS — F0283 Dementia in other diseases classified elsewhere, unspecified severity, with mood disturbance: Secondary | ICD-10-CM | POA: Diagnosis not present

## 2022-11-07 DIAGNOSIS — F0284 Dementia in other diseases classified elsewhere, unspecified severity, with anxiety: Secondary | ICD-10-CM | POA: Diagnosis not present

## 2022-11-07 DIAGNOSIS — E43 Unspecified severe protein-calorie malnutrition: Secondary | ICD-10-CM | POA: Diagnosis not present

## 2022-11-07 DIAGNOSIS — R296 Repeated falls: Secondary | ICD-10-CM | POA: Diagnosis not present

## 2022-11-07 DIAGNOSIS — G311 Senile degeneration of brain, not elsewhere classified: Secondary | ICD-10-CM | POA: Diagnosis not present

## 2022-11-10 DIAGNOSIS — Z6820 Body mass index (BMI) 20.0-20.9, adult: Secondary | ICD-10-CM | POA: Diagnosis not present

## 2022-11-10 DIAGNOSIS — E538 Deficiency of other specified B group vitamins: Secondary | ICD-10-CM | POA: Diagnosis not present

## 2022-11-10 DIAGNOSIS — M810 Age-related osteoporosis without current pathological fracture: Secondary | ICD-10-CM | POA: Diagnosis not present

## 2022-11-10 DIAGNOSIS — Z8719 Personal history of other diseases of the digestive system: Secondary | ICD-10-CM | POA: Diagnosis not present

## 2022-11-10 DIAGNOSIS — G311 Senile degeneration of brain, not elsewhere classified: Secondary | ICD-10-CM | POA: Diagnosis not present

## 2022-11-10 DIAGNOSIS — R32 Unspecified urinary incontinence: Secondary | ICD-10-CM | POA: Diagnosis not present

## 2022-11-10 DIAGNOSIS — G40909 Epilepsy, unspecified, not intractable, without status epilepticus: Secondary | ICD-10-CM | POA: Diagnosis not present

## 2022-11-10 DIAGNOSIS — K219 Gastro-esophageal reflux disease without esophagitis: Secondary | ICD-10-CM | POA: Diagnosis not present

## 2022-11-10 DIAGNOSIS — Z741 Need for assistance with personal care: Secondary | ICD-10-CM | POA: Diagnosis not present

## 2022-11-10 DIAGNOSIS — I4891 Unspecified atrial fibrillation: Secondary | ICD-10-CM | POA: Diagnosis not present

## 2022-11-10 DIAGNOSIS — F0284 Dementia in other diseases classified elsewhere, unspecified severity, with anxiety: Secondary | ICD-10-CM | POA: Diagnosis not present

## 2022-11-10 DIAGNOSIS — R296 Repeated falls: Secondary | ICD-10-CM | POA: Diagnosis not present

## 2022-11-10 DIAGNOSIS — D631 Anemia in chronic kidney disease: Secondary | ICD-10-CM | POA: Diagnosis not present

## 2022-11-10 DIAGNOSIS — Z87440 Personal history of urinary (tract) infections: Secondary | ICD-10-CM | POA: Diagnosis not present

## 2022-11-10 DIAGNOSIS — E43 Unspecified severe protein-calorie malnutrition: Secondary | ICD-10-CM | POA: Diagnosis not present

## 2022-11-10 DIAGNOSIS — H409 Unspecified glaucoma: Secondary | ICD-10-CM | POA: Diagnosis not present

## 2022-11-10 DIAGNOSIS — F0283 Dementia in other diseases classified elsewhere, unspecified severity, with mood disturbance: Secondary | ICD-10-CM | POA: Diagnosis not present

## 2022-11-10 DIAGNOSIS — R159 Full incontinence of feces: Secondary | ICD-10-CM | POA: Diagnosis not present

## 2022-11-10 DIAGNOSIS — I129 Hypertensive chronic kidney disease with stage 1 through stage 4 chronic kidney disease, or unspecified chronic kidney disease: Secondary | ICD-10-CM | POA: Diagnosis not present

## 2022-11-10 DIAGNOSIS — L299 Pruritus, unspecified: Secondary | ICD-10-CM | POA: Diagnosis not present

## 2022-11-10 DIAGNOSIS — E785 Hyperlipidemia, unspecified: Secondary | ICD-10-CM | POA: Diagnosis not present

## 2022-11-10 DIAGNOSIS — N1831 Chronic kidney disease, stage 3a: Secondary | ICD-10-CM | POA: Diagnosis not present

## 2022-11-12 DIAGNOSIS — F0283 Dementia in other diseases classified elsewhere, unspecified severity, with mood disturbance: Secondary | ICD-10-CM | POA: Diagnosis not present

## 2022-11-12 DIAGNOSIS — F0284 Dementia in other diseases classified elsewhere, unspecified severity, with anxiety: Secondary | ICD-10-CM | POA: Diagnosis not present

## 2022-11-12 DIAGNOSIS — R296 Repeated falls: Secondary | ICD-10-CM | POA: Diagnosis not present

## 2022-11-12 DIAGNOSIS — E43 Unspecified severe protein-calorie malnutrition: Secondary | ICD-10-CM | POA: Diagnosis not present

## 2022-11-12 DIAGNOSIS — G311 Senile degeneration of brain, not elsewhere classified: Secondary | ICD-10-CM | POA: Diagnosis not present

## 2022-11-12 DIAGNOSIS — Z6820 Body mass index (BMI) 20.0-20.9, adult: Secondary | ICD-10-CM | POA: Diagnosis not present

## 2022-11-14 ENCOUNTER — Encounter: Payer: Self-pay | Admitting: Podiatry

## 2022-11-14 ENCOUNTER — Ambulatory Visit (INDEPENDENT_AMBULATORY_CARE_PROVIDER_SITE_OTHER): Payer: Medicare Other | Admitting: Podiatry

## 2022-11-14 DIAGNOSIS — M79674 Pain in right toe(s): Secondary | ICD-10-CM | POA: Diagnosis not present

## 2022-11-14 DIAGNOSIS — Z6820 Body mass index (BMI) 20.0-20.9, adult: Secondary | ICD-10-CM | POA: Diagnosis not present

## 2022-11-14 DIAGNOSIS — N1831 Chronic kidney disease, stage 3a: Secondary | ICD-10-CM | POA: Diagnosis not present

## 2022-11-14 DIAGNOSIS — G311 Senile degeneration of brain, not elsewhere classified: Secondary | ICD-10-CM | POA: Diagnosis not present

## 2022-11-14 DIAGNOSIS — N179 Acute kidney failure, unspecified: Secondary | ICD-10-CM | POA: Diagnosis not present

## 2022-11-14 DIAGNOSIS — F0284 Dementia in other diseases classified elsewhere, unspecified severity, with anxiety: Secondary | ICD-10-CM | POA: Diagnosis not present

## 2022-11-14 DIAGNOSIS — B351 Tinea unguium: Secondary | ICD-10-CM

## 2022-11-14 DIAGNOSIS — M79675 Pain in left toe(s): Secondary | ICD-10-CM | POA: Diagnosis not present

## 2022-11-14 DIAGNOSIS — R296 Repeated falls: Secondary | ICD-10-CM | POA: Diagnosis not present

## 2022-11-14 DIAGNOSIS — E43 Unspecified severe protein-calorie malnutrition: Secondary | ICD-10-CM | POA: Diagnosis not present

## 2022-11-14 DIAGNOSIS — F0283 Dementia in other diseases classified elsewhere, unspecified severity, with mood disturbance: Secondary | ICD-10-CM | POA: Diagnosis not present

## 2022-11-14 NOTE — Progress Notes (Signed)
This patient returns to my office for at risk foot care.  This patient requires this care by a professional since this patient will be at risk due to having acute renal disease. This patient is brought to the office by her caregiver in a wheelchair.  This patient is unable to cut nails herself since the patient cannot reach her nails.These nails are painful walking and wearing shoes.  This patient presents for at risk foot care today.  General Appearance  Alert, conversant and in no acute stress.  Vascular  Dorsalis pedis and posterior tibial  pulses are  weakly palpable  bilaterally.  Capillary return is within normal limits  bilaterally. Cold feet.bilaterally. Absent digital hair  B/l.  Neurologic  Senn-Weinstein monofilament wire test within normal limits  bilaterally. Muscle power within normal limits bilaterally.  Nails Thick disfigured discolored nails with subungual debris  from hallux to fifth toes bilaterally. No evidence of bacterial infection or drainage bilaterally.  Orthopedic  No limitations of motion  feet .  No crepitus or effusions noted.  No bony pathology or digital deformities noted.  Skin  normotropic skin with no porokeratosis noted bilaterally.  No signs of infections or ulcers noted.     Onychomycosis  Pain in right toes  Pain in left toes  Consent was obtained for treatment procedures.   Mechanical debridement of nails 1-5  bilaterally performed with a nail nipper.  Filed with dremel without incident.    Return office visit     prn                Told patient to return for periodic foot care and evaluation due to potential at risk complications.   Gardiner Barefoot DPM

## 2022-11-16 DIAGNOSIS — F0280 Dementia in other diseases classified elsewhere without behavioral disturbance: Secondary | ICD-10-CM | POA: Diagnosis not present

## 2022-11-16 DIAGNOSIS — G4089 Other seizures: Secondary | ICD-10-CM | POA: Diagnosis not present

## 2022-11-16 DIAGNOSIS — I480 Paroxysmal atrial fibrillation: Secondary | ICD-10-CM | POA: Diagnosis not present

## 2022-11-16 DIAGNOSIS — K5909 Other constipation: Secondary | ICD-10-CM | POA: Diagnosis not present

## 2022-11-16 DIAGNOSIS — K219 Gastro-esophageal reflux disease without esophagitis: Secondary | ICD-10-CM | POA: Diagnosis not present

## 2022-11-16 DIAGNOSIS — F329 Major depressive disorder, single episode, unspecified: Secondary | ICD-10-CM | POA: Diagnosis not present

## 2022-11-16 DIAGNOSIS — I10 Essential (primary) hypertension: Secondary | ICD-10-CM | POA: Diagnosis not present

## 2022-11-17 DIAGNOSIS — R296 Repeated falls: Secondary | ICD-10-CM | POA: Diagnosis not present

## 2022-11-17 DIAGNOSIS — G311 Senile degeneration of brain, not elsewhere classified: Secondary | ICD-10-CM | POA: Diagnosis not present

## 2022-11-17 DIAGNOSIS — F0283 Dementia in other diseases classified elsewhere, unspecified severity, with mood disturbance: Secondary | ICD-10-CM | POA: Diagnosis not present

## 2022-11-17 DIAGNOSIS — E43 Unspecified severe protein-calorie malnutrition: Secondary | ICD-10-CM | POA: Diagnosis not present

## 2022-11-17 DIAGNOSIS — F0284 Dementia in other diseases classified elsewhere, unspecified severity, with anxiety: Secondary | ICD-10-CM | POA: Diagnosis not present

## 2022-11-17 DIAGNOSIS — Z6820 Body mass index (BMI) 20.0-20.9, adult: Secondary | ICD-10-CM | POA: Diagnosis not present

## 2022-11-19 DIAGNOSIS — F0284 Dementia in other diseases classified elsewhere, unspecified severity, with anxiety: Secondary | ICD-10-CM | POA: Diagnosis not present

## 2022-11-19 DIAGNOSIS — G311 Senile degeneration of brain, not elsewhere classified: Secondary | ICD-10-CM | POA: Diagnosis not present

## 2022-11-19 DIAGNOSIS — M150 Primary generalized (osteo)arthritis: Secondary | ICD-10-CM | POA: Diagnosis not present

## 2022-11-19 DIAGNOSIS — K219 Gastro-esophageal reflux disease without esophagitis: Secondary | ICD-10-CM | POA: Diagnosis not present

## 2022-11-19 DIAGNOSIS — I10 Essential (primary) hypertension: Secondary | ICD-10-CM | POA: Diagnosis not present

## 2022-11-19 DIAGNOSIS — R296 Repeated falls: Secondary | ICD-10-CM | POA: Diagnosis not present

## 2022-11-19 DIAGNOSIS — F0283 Dementia in other diseases classified elsewhere, unspecified severity, with mood disturbance: Secondary | ICD-10-CM | POA: Diagnosis not present

## 2022-11-19 DIAGNOSIS — E43 Unspecified severe protein-calorie malnutrition: Secondary | ICD-10-CM | POA: Diagnosis not present

## 2022-11-19 DIAGNOSIS — I480 Paroxysmal atrial fibrillation: Secondary | ICD-10-CM | POA: Diagnosis not present

## 2022-11-19 DIAGNOSIS — F0280 Dementia in other diseases classified elsewhere without behavioral disturbance: Secondary | ICD-10-CM | POA: Diagnosis not present

## 2022-11-19 DIAGNOSIS — Z6820 Body mass index (BMI) 20.0-20.9, adult: Secondary | ICD-10-CM | POA: Diagnosis not present

## 2022-11-21 DIAGNOSIS — E43 Unspecified severe protein-calorie malnutrition: Secondary | ICD-10-CM | POA: Diagnosis not present

## 2022-11-21 DIAGNOSIS — Z6820 Body mass index (BMI) 20.0-20.9, adult: Secondary | ICD-10-CM | POA: Diagnosis not present

## 2022-11-21 DIAGNOSIS — R296 Repeated falls: Secondary | ICD-10-CM | POA: Diagnosis not present

## 2022-11-21 DIAGNOSIS — G311 Senile degeneration of brain, not elsewhere classified: Secondary | ICD-10-CM | POA: Diagnosis not present

## 2022-11-21 DIAGNOSIS — F0283 Dementia in other diseases classified elsewhere, unspecified severity, with mood disturbance: Secondary | ICD-10-CM | POA: Diagnosis not present

## 2022-11-21 DIAGNOSIS — F0284 Dementia in other diseases classified elsewhere, unspecified severity, with anxiety: Secondary | ICD-10-CM | POA: Diagnosis not present

## 2022-11-23 DIAGNOSIS — K5909 Other constipation: Secondary | ICD-10-CM | POA: Diagnosis not present

## 2022-11-23 DIAGNOSIS — F329 Major depressive disorder, single episode, unspecified: Secondary | ICD-10-CM | POA: Diagnosis not present

## 2022-11-23 DIAGNOSIS — G4089 Other seizures: Secondary | ICD-10-CM | POA: Diagnosis not present

## 2022-11-23 DIAGNOSIS — I480 Paroxysmal atrial fibrillation: Secondary | ICD-10-CM | POA: Diagnosis not present

## 2022-11-23 DIAGNOSIS — F0280 Dementia in other diseases classified elsewhere without behavioral disturbance: Secondary | ICD-10-CM | POA: Diagnosis not present

## 2022-11-23 DIAGNOSIS — K219 Gastro-esophageal reflux disease without esophagitis: Secondary | ICD-10-CM | POA: Diagnosis not present

## 2022-11-24 DIAGNOSIS — F0284 Dementia in other diseases classified elsewhere, unspecified severity, with anxiety: Secondary | ICD-10-CM | POA: Diagnosis not present

## 2022-11-24 DIAGNOSIS — R296 Repeated falls: Secondary | ICD-10-CM | POA: Diagnosis not present

## 2022-11-24 DIAGNOSIS — G311 Senile degeneration of brain, not elsewhere classified: Secondary | ICD-10-CM | POA: Diagnosis not present

## 2022-11-24 DIAGNOSIS — F0283 Dementia in other diseases classified elsewhere, unspecified severity, with mood disturbance: Secondary | ICD-10-CM | POA: Diagnosis not present

## 2022-11-24 DIAGNOSIS — Z6820 Body mass index (BMI) 20.0-20.9, adult: Secondary | ICD-10-CM | POA: Diagnosis not present

## 2022-11-24 DIAGNOSIS — E43 Unspecified severe protein-calorie malnutrition: Secondary | ICD-10-CM | POA: Diagnosis not present

## 2022-11-26 DIAGNOSIS — F0284 Dementia in other diseases classified elsewhere, unspecified severity, with anxiety: Secondary | ICD-10-CM | POA: Diagnosis not present

## 2022-11-26 DIAGNOSIS — G311 Senile degeneration of brain, not elsewhere classified: Secondary | ICD-10-CM | POA: Diagnosis not present

## 2022-11-26 DIAGNOSIS — Z6820 Body mass index (BMI) 20.0-20.9, adult: Secondary | ICD-10-CM | POA: Diagnosis not present

## 2022-11-26 DIAGNOSIS — F0283 Dementia in other diseases classified elsewhere, unspecified severity, with mood disturbance: Secondary | ICD-10-CM | POA: Diagnosis not present

## 2022-11-26 DIAGNOSIS — E43 Unspecified severe protein-calorie malnutrition: Secondary | ICD-10-CM | POA: Diagnosis not present

## 2022-11-26 DIAGNOSIS — R296 Repeated falls: Secondary | ICD-10-CM | POA: Diagnosis not present

## 2022-11-28 DIAGNOSIS — F0390 Unspecified dementia without behavioral disturbance: Secondary | ICD-10-CM | POA: Diagnosis not present

## 2022-11-28 DIAGNOSIS — G311 Senile degeneration of brain, not elsewhere classified: Secondary | ICD-10-CM | POA: Diagnosis not present

## 2022-11-28 DIAGNOSIS — F0284 Dementia in other diseases classified elsewhere, unspecified severity, with anxiety: Secondary | ICD-10-CM | POA: Diagnosis not present

## 2022-11-28 DIAGNOSIS — G40909 Epilepsy, unspecified, not intractable, without status epilepticus: Secondary | ICD-10-CM | POA: Diagnosis not present

## 2022-11-28 DIAGNOSIS — R296 Repeated falls: Secondary | ICD-10-CM | POA: Diagnosis not present

## 2022-11-28 DIAGNOSIS — E43 Unspecified severe protein-calorie malnutrition: Secondary | ICD-10-CM | POA: Diagnosis not present

## 2022-11-28 DIAGNOSIS — D649 Anemia, unspecified: Secondary | ICD-10-CM | POA: Diagnosis not present

## 2022-11-28 DIAGNOSIS — Z6820 Body mass index (BMI) 20.0-20.9, adult: Secondary | ICD-10-CM | POA: Diagnosis not present

## 2022-11-28 DIAGNOSIS — F0283 Dementia in other diseases classified elsewhere, unspecified severity, with mood disturbance: Secondary | ICD-10-CM | POA: Diagnosis not present

## 2022-11-28 DIAGNOSIS — G9340 Encephalopathy, unspecified: Secondary | ICD-10-CM | POA: Diagnosis not present

## 2022-12-01 DIAGNOSIS — F0283 Dementia in other diseases classified elsewhere, unspecified severity, with mood disturbance: Secondary | ICD-10-CM | POA: Diagnosis not present

## 2022-12-01 DIAGNOSIS — F0284 Dementia in other diseases classified elsewhere, unspecified severity, with anxiety: Secondary | ICD-10-CM | POA: Diagnosis not present

## 2022-12-01 DIAGNOSIS — E43 Unspecified severe protein-calorie malnutrition: Secondary | ICD-10-CM | POA: Diagnosis not present

## 2022-12-01 DIAGNOSIS — R296 Repeated falls: Secondary | ICD-10-CM | POA: Diagnosis not present

## 2022-12-01 DIAGNOSIS — G311 Senile degeneration of brain, not elsewhere classified: Secondary | ICD-10-CM | POA: Diagnosis not present

## 2022-12-01 DIAGNOSIS — Z6820 Body mass index (BMI) 20.0-20.9, adult: Secondary | ICD-10-CM | POA: Diagnosis not present

## 2022-12-03 DIAGNOSIS — G311 Senile degeneration of brain, not elsewhere classified: Secondary | ICD-10-CM | POA: Diagnosis not present

## 2022-12-03 DIAGNOSIS — E43 Unspecified severe protein-calorie malnutrition: Secondary | ICD-10-CM | POA: Diagnosis not present

## 2022-12-03 DIAGNOSIS — F0284 Dementia in other diseases classified elsewhere, unspecified severity, with anxiety: Secondary | ICD-10-CM | POA: Diagnosis not present

## 2022-12-03 DIAGNOSIS — F0283 Dementia in other diseases classified elsewhere, unspecified severity, with mood disturbance: Secondary | ICD-10-CM | POA: Diagnosis not present

## 2022-12-03 DIAGNOSIS — Z6820 Body mass index (BMI) 20.0-20.9, adult: Secondary | ICD-10-CM | POA: Diagnosis not present

## 2022-12-03 DIAGNOSIS — R296 Repeated falls: Secondary | ICD-10-CM | POA: Diagnosis not present

## 2022-12-05 DIAGNOSIS — I10 Essential (primary) hypertension: Secondary | ICD-10-CM | POA: Diagnosis not present

## 2022-12-05 DIAGNOSIS — K219 Gastro-esophageal reflux disease without esophagitis: Secondary | ICD-10-CM | POA: Diagnosis not present

## 2022-12-05 DIAGNOSIS — F0283 Dementia in other diseases classified elsewhere, unspecified severity, with mood disturbance: Secondary | ICD-10-CM | POA: Diagnosis not present

## 2022-12-05 DIAGNOSIS — R296 Repeated falls: Secondary | ICD-10-CM | POA: Diagnosis not present

## 2022-12-05 DIAGNOSIS — I480 Paroxysmal atrial fibrillation: Secondary | ICD-10-CM | POA: Diagnosis not present

## 2022-12-05 DIAGNOSIS — G311 Senile degeneration of brain, not elsewhere classified: Secondary | ICD-10-CM | POA: Diagnosis not present

## 2022-12-05 DIAGNOSIS — E43 Unspecified severe protein-calorie malnutrition: Secondary | ICD-10-CM | POA: Diagnosis not present

## 2022-12-05 DIAGNOSIS — M150 Primary generalized (osteo)arthritis: Secondary | ICD-10-CM | POA: Diagnosis not present

## 2022-12-05 DIAGNOSIS — F0280 Dementia in other diseases classified elsewhere without behavioral disturbance: Secondary | ICD-10-CM | POA: Diagnosis not present

## 2022-12-05 DIAGNOSIS — F0284 Dementia in other diseases classified elsewhere, unspecified severity, with anxiety: Secondary | ICD-10-CM | POA: Diagnosis not present

## 2022-12-05 DIAGNOSIS — Z6820 Body mass index (BMI) 20.0-20.9, adult: Secondary | ICD-10-CM | POA: Diagnosis not present

## 2022-12-08 DIAGNOSIS — R296 Repeated falls: Secondary | ICD-10-CM | POA: Diagnosis not present

## 2022-12-08 DIAGNOSIS — E43 Unspecified severe protein-calorie malnutrition: Secondary | ICD-10-CM | POA: Diagnosis not present

## 2022-12-08 DIAGNOSIS — G311 Senile degeneration of brain, not elsewhere classified: Secondary | ICD-10-CM | POA: Diagnosis not present

## 2022-12-08 DIAGNOSIS — F0284 Dementia in other diseases classified elsewhere, unspecified severity, with anxiety: Secondary | ICD-10-CM | POA: Diagnosis not present

## 2022-12-08 DIAGNOSIS — F0283 Dementia in other diseases classified elsewhere, unspecified severity, with mood disturbance: Secondary | ICD-10-CM | POA: Diagnosis not present

## 2022-12-08 DIAGNOSIS — Z6820 Body mass index (BMI) 20.0-20.9, adult: Secondary | ICD-10-CM | POA: Diagnosis not present

## 2022-12-09 DIAGNOSIS — R296 Repeated falls: Secondary | ICD-10-CM | POA: Diagnosis not present

## 2022-12-09 DIAGNOSIS — F0283 Dementia in other diseases classified elsewhere, unspecified severity, with mood disturbance: Secondary | ICD-10-CM | POA: Diagnosis not present

## 2022-12-09 DIAGNOSIS — E43 Unspecified severe protein-calorie malnutrition: Secondary | ICD-10-CM | POA: Diagnosis not present

## 2022-12-09 DIAGNOSIS — Z6820 Body mass index (BMI) 20.0-20.9, adult: Secondary | ICD-10-CM | POA: Diagnosis not present

## 2022-12-09 DIAGNOSIS — F0284 Dementia in other diseases classified elsewhere, unspecified severity, with anxiety: Secondary | ICD-10-CM | POA: Diagnosis not present

## 2022-12-09 DIAGNOSIS — G311 Senile degeneration of brain, not elsewhere classified: Secondary | ICD-10-CM | POA: Diagnosis not present

## 2022-12-10 DIAGNOSIS — F0283 Dementia in other diseases classified elsewhere, unspecified severity, with mood disturbance: Secondary | ICD-10-CM | POA: Diagnosis not present

## 2022-12-10 DIAGNOSIS — F0284 Dementia in other diseases classified elsewhere, unspecified severity, with anxiety: Secondary | ICD-10-CM | POA: Diagnosis not present

## 2022-12-10 DIAGNOSIS — Z6820 Body mass index (BMI) 20.0-20.9, adult: Secondary | ICD-10-CM | POA: Diagnosis not present

## 2022-12-10 DIAGNOSIS — G311 Senile degeneration of brain, not elsewhere classified: Secondary | ICD-10-CM | POA: Diagnosis not present

## 2022-12-10 DIAGNOSIS — R296 Repeated falls: Secondary | ICD-10-CM | POA: Diagnosis not present

## 2022-12-10 DIAGNOSIS — E43 Unspecified severe protein-calorie malnutrition: Secondary | ICD-10-CM | POA: Diagnosis not present

## 2022-12-11 DIAGNOSIS — I129 Hypertensive chronic kidney disease with stage 1 through stage 4 chronic kidney disease, or unspecified chronic kidney disease: Secondary | ICD-10-CM | POA: Diagnosis not present

## 2022-12-11 DIAGNOSIS — R159 Full incontinence of feces: Secondary | ICD-10-CM | POA: Diagnosis not present

## 2022-12-11 DIAGNOSIS — Z741 Need for assistance with personal care: Secondary | ICD-10-CM | POA: Diagnosis not present

## 2022-12-11 DIAGNOSIS — R32 Unspecified urinary incontinence: Secondary | ICD-10-CM | POA: Diagnosis not present

## 2022-12-11 DIAGNOSIS — F0283 Dementia in other diseases classified elsewhere, unspecified severity, with mood disturbance: Secondary | ICD-10-CM | POA: Diagnosis not present

## 2022-12-11 DIAGNOSIS — Z6820 Body mass index (BMI) 20.0-20.9, adult: Secondary | ICD-10-CM | POA: Diagnosis not present

## 2022-12-11 DIAGNOSIS — H409 Unspecified glaucoma: Secondary | ICD-10-CM | POA: Diagnosis not present

## 2022-12-11 DIAGNOSIS — K219 Gastro-esophageal reflux disease without esophagitis: Secondary | ICD-10-CM | POA: Diagnosis not present

## 2022-12-11 DIAGNOSIS — M810 Age-related osteoporosis without current pathological fracture: Secondary | ICD-10-CM | POA: Diagnosis not present

## 2022-12-11 DIAGNOSIS — E538 Deficiency of other specified B group vitamins: Secondary | ICD-10-CM | POA: Diagnosis not present

## 2022-12-11 DIAGNOSIS — Z8719 Personal history of other diseases of the digestive system: Secondary | ICD-10-CM | POA: Diagnosis not present

## 2022-12-11 DIAGNOSIS — E785 Hyperlipidemia, unspecified: Secondary | ICD-10-CM | POA: Diagnosis not present

## 2022-12-11 DIAGNOSIS — E43 Unspecified severe protein-calorie malnutrition: Secondary | ICD-10-CM | POA: Diagnosis not present

## 2022-12-11 DIAGNOSIS — F0284 Dementia in other diseases classified elsewhere, unspecified severity, with anxiety: Secondary | ICD-10-CM | POA: Diagnosis not present

## 2022-12-11 DIAGNOSIS — N1831 Chronic kidney disease, stage 3a: Secondary | ICD-10-CM | POA: Diagnosis not present

## 2022-12-11 DIAGNOSIS — L299 Pruritus, unspecified: Secondary | ICD-10-CM | POA: Diagnosis not present

## 2022-12-11 DIAGNOSIS — G40909 Epilepsy, unspecified, not intractable, without status epilepticus: Secondary | ICD-10-CM | POA: Diagnosis not present

## 2022-12-11 DIAGNOSIS — R296 Repeated falls: Secondary | ICD-10-CM | POA: Diagnosis not present

## 2022-12-11 DIAGNOSIS — D631 Anemia in chronic kidney disease: Secondary | ICD-10-CM | POA: Diagnosis not present

## 2022-12-11 DIAGNOSIS — Z87440 Personal history of urinary (tract) infections: Secondary | ICD-10-CM | POA: Diagnosis not present

## 2022-12-11 DIAGNOSIS — G311 Senile degeneration of brain, not elsewhere classified: Secondary | ICD-10-CM | POA: Diagnosis not present

## 2022-12-11 DIAGNOSIS — I4891 Unspecified atrial fibrillation: Secondary | ICD-10-CM | POA: Diagnosis not present

## 2022-12-12 DIAGNOSIS — E43 Unspecified severe protein-calorie malnutrition: Secondary | ICD-10-CM | POA: Diagnosis not present

## 2022-12-12 DIAGNOSIS — F0283 Dementia in other diseases classified elsewhere, unspecified severity, with mood disturbance: Secondary | ICD-10-CM | POA: Diagnosis not present

## 2022-12-12 DIAGNOSIS — Z6820 Body mass index (BMI) 20.0-20.9, adult: Secondary | ICD-10-CM | POA: Diagnosis not present

## 2022-12-12 DIAGNOSIS — G311 Senile degeneration of brain, not elsewhere classified: Secondary | ICD-10-CM | POA: Diagnosis not present

## 2022-12-12 DIAGNOSIS — F0284 Dementia in other diseases classified elsewhere, unspecified severity, with anxiety: Secondary | ICD-10-CM | POA: Diagnosis not present

## 2022-12-12 DIAGNOSIS — R296 Repeated falls: Secondary | ICD-10-CM | POA: Diagnosis not present

## 2022-12-14 DIAGNOSIS — I480 Paroxysmal atrial fibrillation: Secondary | ICD-10-CM | POA: Diagnosis not present

## 2022-12-14 DIAGNOSIS — F0280 Dementia in other diseases classified elsewhere without behavioral disturbance: Secondary | ICD-10-CM | POA: Diagnosis not present

## 2022-12-14 DIAGNOSIS — M150 Primary generalized (osteo)arthritis: Secondary | ICD-10-CM | POA: Diagnosis not present

## 2022-12-14 DIAGNOSIS — K219 Gastro-esophageal reflux disease without esophagitis: Secondary | ICD-10-CM | POA: Diagnosis not present

## 2022-12-14 DIAGNOSIS — I10 Essential (primary) hypertension: Secondary | ICD-10-CM | POA: Diagnosis not present

## 2022-12-14 DIAGNOSIS — F329 Major depressive disorder, single episode, unspecified: Secondary | ICD-10-CM | POA: Diagnosis not present

## 2022-12-14 DIAGNOSIS — G4089 Other seizures: Secondary | ICD-10-CM | POA: Diagnosis not present

## 2022-12-14 DIAGNOSIS — K5909 Other constipation: Secondary | ICD-10-CM | POA: Diagnosis not present

## 2022-12-15 DIAGNOSIS — E43 Unspecified severe protein-calorie malnutrition: Secondary | ICD-10-CM | POA: Diagnosis not present

## 2022-12-15 DIAGNOSIS — F0284 Dementia in other diseases classified elsewhere, unspecified severity, with anxiety: Secondary | ICD-10-CM | POA: Diagnosis not present

## 2022-12-15 DIAGNOSIS — Z6820 Body mass index (BMI) 20.0-20.9, adult: Secondary | ICD-10-CM | POA: Diagnosis not present

## 2022-12-15 DIAGNOSIS — F0283 Dementia in other diseases classified elsewhere, unspecified severity, with mood disturbance: Secondary | ICD-10-CM | POA: Diagnosis not present

## 2022-12-15 DIAGNOSIS — G311 Senile degeneration of brain, not elsewhere classified: Secondary | ICD-10-CM | POA: Diagnosis not present

## 2022-12-15 DIAGNOSIS — R296 Repeated falls: Secondary | ICD-10-CM | POA: Diagnosis not present

## 2022-12-17 DIAGNOSIS — R296 Repeated falls: Secondary | ICD-10-CM | POA: Diagnosis not present

## 2022-12-17 DIAGNOSIS — F0284 Dementia in other diseases classified elsewhere, unspecified severity, with anxiety: Secondary | ICD-10-CM | POA: Diagnosis not present

## 2022-12-17 DIAGNOSIS — E43 Unspecified severe protein-calorie malnutrition: Secondary | ICD-10-CM | POA: Diagnosis not present

## 2022-12-17 DIAGNOSIS — F0283 Dementia in other diseases classified elsewhere, unspecified severity, with mood disturbance: Secondary | ICD-10-CM | POA: Diagnosis not present

## 2022-12-17 DIAGNOSIS — G311 Senile degeneration of brain, not elsewhere classified: Secondary | ICD-10-CM | POA: Diagnosis not present

## 2022-12-17 DIAGNOSIS — Z6820 Body mass index (BMI) 20.0-20.9, adult: Secondary | ICD-10-CM | POA: Diagnosis not present

## 2022-12-19 DIAGNOSIS — I10 Essential (primary) hypertension: Secondary | ICD-10-CM | POA: Diagnosis not present

## 2022-12-19 DIAGNOSIS — F0284 Dementia in other diseases classified elsewhere, unspecified severity, with anxiety: Secondary | ICD-10-CM | POA: Diagnosis not present

## 2022-12-19 DIAGNOSIS — G311 Senile degeneration of brain, not elsewhere classified: Secondary | ICD-10-CM | POA: Diagnosis not present

## 2022-12-19 DIAGNOSIS — F0280 Dementia in other diseases classified elsewhere without behavioral disturbance: Secondary | ICD-10-CM | POA: Diagnosis not present

## 2022-12-19 DIAGNOSIS — Z6820 Body mass index (BMI) 20.0-20.9, adult: Secondary | ICD-10-CM | POA: Diagnosis not present

## 2022-12-19 DIAGNOSIS — E43 Unspecified severe protein-calorie malnutrition: Secondary | ICD-10-CM | POA: Diagnosis not present

## 2022-12-19 DIAGNOSIS — R296 Repeated falls: Secondary | ICD-10-CM | POA: Diagnosis not present

## 2022-12-19 DIAGNOSIS — F0283 Dementia in other diseases classified elsewhere, unspecified severity, with mood disturbance: Secondary | ICD-10-CM | POA: Diagnosis not present

## 2022-12-19 DIAGNOSIS — I480 Paroxysmal atrial fibrillation: Secondary | ICD-10-CM | POA: Diagnosis not present

## 2022-12-19 DIAGNOSIS — K219 Gastro-esophageal reflux disease without esophagitis: Secondary | ICD-10-CM | POA: Diagnosis not present

## 2022-12-22 DIAGNOSIS — R296 Repeated falls: Secondary | ICD-10-CM | POA: Diagnosis not present

## 2022-12-22 DIAGNOSIS — F0284 Dementia in other diseases classified elsewhere, unspecified severity, with anxiety: Secondary | ICD-10-CM | POA: Diagnosis not present

## 2022-12-22 DIAGNOSIS — Z6820 Body mass index (BMI) 20.0-20.9, adult: Secondary | ICD-10-CM | POA: Diagnosis not present

## 2022-12-22 DIAGNOSIS — F0283 Dementia in other diseases classified elsewhere, unspecified severity, with mood disturbance: Secondary | ICD-10-CM | POA: Diagnosis not present

## 2022-12-22 DIAGNOSIS — E43 Unspecified severe protein-calorie malnutrition: Secondary | ICD-10-CM | POA: Diagnosis not present

## 2022-12-22 DIAGNOSIS — G311 Senile degeneration of brain, not elsewhere classified: Secondary | ICD-10-CM | POA: Diagnosis not present

## 2022-12-23 DIAGNOSIS — R296 Repeated falls: Secondary | ICD-10-CM | POA: Diagnosis not present

## 2022-12-23 DIAGNOSIS — E43 Unspecified severe protein-calorie malnutrition: Secondary | ICD-10-CM | POA: Diagnosis not present

## 2022-12-23 DIAGNOSIS — G311 Senile degeneration of brain, not elsewhere classified: Secondary | ICD-10-CM | POA: Diagnosis not present

## 2022-12-23 DIAGNOSIS — F0284 Dementia in other diseases classified elsewhere, unspecified severity, with anxiety: Secondary | ICD-10-CM | POA: Diagnosis not present

## 2022-12-23 DIAGNOSIS — Z6820 Body mass index (BMI) 20.0-20.9, adult: Secondary | ICD-10-CM | POA: Diagnosis not present

## 2022-12-23 DIAGNOSIS — F0283 Dementia in other diseases classified elsewhere, unspecified severity, with mood disturbance: Secondary | ICD-10-CM | POA: Diagnosis not present

## 2022-12-24 DIAGNOSIS — Z6820 Body mass index (BMI) 20.0-20.9, adult: Secondary | ICD-10-CM | POA: Diagnosis not present

## 2022-12-24 DIAGNOSIS — F0283 Dementia in other diseases classified elsewhere, unspecified severity, with mood disturbance: Secondary | ICD-10-CM | POA: Diagnosis not present

## 2022-12-24 DIAGNOSIS — G311 Senile degeneration of brain, not elsewhere classified: Secondary | ICD-10-CM | POA: Diagnosis not present

## 2022-12-24 DIAGNOSIS — F0284 Dementia in other diseases classified elsewhere, unspecified severity, with anxiety: Secondary | ICD-10-CM | POA: Diagnosis not present

## 2022-12-24 DIAGNOSIS — R296 Repeated falls: Secondary | ICD-10-CM | POA: Diagnosis not present

## 2022-12-24 DIAGNOSIS — E43 Unspecified severe protein-calorie malnutrition: Secondary | ICD-10-CM | POA: Diagnosis not present

## 2022-12-26 DIAGNOSIS — G311 Senile degeneration of brain, not elsewhere classified: Secondary | ICD-10-CM | POA: Diagnosis not present

## 2022-12-26 DIAGNOSIS — F0283 Dementia in other diseases classified elsewhere, unspecified severity, with mood disturbance: Secondary | ICD-10-CM | POA: Diagnosis not present

## 2022-12-26 DIAGNOSIS — F0284 Dementia in other diseases classified elsewhere, unspecified severity, with anxiety: Secondary | ICD-10-CM | POA: Diagnosis not present

## 2022-12-26 DIAGNOSIS — Z6820 Body mass index (BMI) 20.0-20.9, adult: Secondary | ICD-10-CM | POA: Diagnosis not present

## 2022-12-26 DIAGNOSIS — R296 Repeated falls: Secondary | ICD-10-CM | POA: Diagnosis not present

## 2022-12-26 DIAGNOSIS — E43 Unspecified severe protein-calorie malnutrition: Secondary | ICD-10-CM | POA: Diagnosis not present

## 2022-12-29 DIAGNOSIS — E43 Unspecified severe protein-calorie malnutrition: Secondary | ICD-10-CM | POA: Diagnosis not present

## 2022-12-29 DIAGNOSIS — R296 Repeated falls: Secondary | ICD-10-CM | POA: Diagnosis not present

## 2022-12-29 DIAGNOSIS — F0283 Dementia in other diseases classified elsewhere, unspecified severity, with mood disturbance: Secondary | ICD-10-CM | POA: Diagnosis not present

## 2022-12-29 DIAGNOSIS — Z6820 Body mass index (BMI) 20.0-20.9, adult: Secondary | ICD-10-CM | POA: Diagnosis not present

## 2022-12-29 DIAGNOSIS — G311 Senile degeneration of brain, not elsewhere classified: Secondary | ICD-10-CM | POA: Diagnosis not present

## 2022-12-29 DIAGNOSIS — F0284 Dementia in other diseases classified elsewhere, unspecified severity, with anxiety: Secondary | ICD-10-CM | POA: Diagnosis not present

## 2022-12-30 DIAGNOSIS — R296 Repeated falls: Secondary | ICD-10-CM | POA: Diagnosis not present

## 2022-12-30 DIAGNOSIS — Z6820 Body mass index (BMI) 20.0-20.9, adult: Secondary | ICD-10-CM | POA: Diagnosis not present

## 2022-12-30 DIAGNOSIS — F0284 Dementia in other diseases classified elsewhere, unspecified severity, with anxiety: Secondary | ICD-10-CM | POA: Diagnosis not present

## 2022-12-30 DIAGNOSIS — G311 Senile degeneration of brain, not elsewhere classified: Secondary | ICD-10-CM | POA: Diagnosis not present

## 2022-12-30 DIAGNOSIS — F0283 Dementia in other diseases classified elsewhere, unspecified severity, with mood disturbance: Secondary | ICD-10-CM | POA: Diagnosis not present

## 2022-12-30 DIAGNOSIS — E43 Unspecified severe protein-calorie malnutrition: Secondary | ICD-10-CM | POA: Diagnosis not present

## 2022-12-31 DIAGNOSIS — F0283 Dementia in other diseases classified elsewhere, unspecified severity, with mood disturbance: Secondary | ICD-10-CM | POA: Diagnosis not present

## 2022-12-31 DIAGNOSIS — E43 Unspecified severe protein-calorie malnutrition: Secondary | ICD-10-CM | POA: Diagnosis not present

## 2022-12-31 DIAGNOSIS — G311 Senile degeneration of brain, not elsewhere classified: Secondary | ICD-10-CM | POA: Diagnosis not present

## 2022-12-31 DIAGNOSIS — F0284 Dementia in other diseases classified elsewhere, unspecified severity, with anxiety: Secondary | ICD-10-CM | POA: Diagnosis not present

## 2022-12-31 DIAGNOSIS — Z6820 Body mass index (BMI) 20.0-20.9, adult: Secondary | ICD-10-CM | POA: Diagnosis not present

## 2022-12-31 DIAGNOSIS — R296 Repeated falls: Secondary | ICD-10-CM | POA: Diagnosis not present

## 2023-01-02 DIAGNOSIS — F0280 Dementia in other diseases classified elsewhere without behavioral disturbance: Secondary | ICD-10-CM | POA: Diagnosis not present

## 2023-01-02 DIAGNOSIS — F329 Major depressive disorder, single episode, unspecified: Secondary | ICD-10-CM | POA: Diagnosis not present

## 2023-01-02 DIAGNOSIS — I10 Essential (primary) hypertension: Secondary | ICD-10-CM | POA: Diagnosis not present

## 2023-01-02 DIAGNOSIS — Z6820 Body mass index (BMI) 20.0-20.9, adult: Secondary | ICD-10-CM | POA: Diagnosis not present

## 2023-01-02 DIAGNOSIS — G4089 Other seizures: Secondary | ICD-10-CM | POA: Diagnosis not present

## 2023-01-02 DIAGNOSIS — F0283 Dementia in other diseases classified elsewhere, unspecified severity, with mood disturbance: Secondary | ICD-10-CM | POA: Diagnosis not present

## 2023-01-02 DIAGNOSIS — R296 Repeated falls: Secondary | ICD-10-CM | POA: Diagnosis not present

## 2023-01-02 DIAGNOSIS — G311 Senile degeneration of brain, not elsewhere classified: Secondary | ICD-10-CM | POA: Diagnosis not present

## 2023-01-02 DIAGNOSIS — I482 Chronic atrial fibrillation, unspecified: Secondary | ICD-10-CM | POA: Diagnosis not present

## 2023-01-02 DIAGNOSIS — E43 Unspecified severe protein-calorie malnutrition: Secondary | ICD-10-CM | POA: Diagnosis not present

## 2023-01-02 DIAGNOSIS — F0284 Dementia in other diseases classified elsewhere, unspecified severity, with anxiety: Secondary | ICD-10-CM | POA: Diagnosis not present

## 2023-01-04 DIAGNOSIS — F0280 Dementia in other diseases classified elsewhere without behavioral disturbance: Secondary | ICD-10-CM | POA: Diagnosis not present

## 2023-01-04 DIAGNOSIS — I482 Chronic atrial fibrillation, unspecified: Secondary | ICD-10-CM | POA: Diagnosis not present

## 2023-01-04 DIAGNOSIS — K5909 Other constipation: Secondary | ICD-10-CM | POA: Diagnosis not present

## 2023-01-04 DIAGNOSIS — I10 Essential (primary) hypertension: Secondary | ICD-10-CM | POA: Diagnosis not present

## 2023-01-04 DIAGNOSIS — K219 Gastro-esophageal reflux disease without esophagitis: Secondary | ICD-10-CM | POA: Diagnosis not present

## 2023-01-04 DIAGNOSIS — F329 Major depressive disorder, single episode, unspecified: Secondary | ICD-10-CM | POA: Diagnosis not present

## 2023-01-04 DIAGNOSIS — G4089 Other seizures: Secondary | ICD-10-CM | POA: Diagnosis not present

## 2023-01-04 DIAGNOSIS — M150 Primary generalized (osteo)arthritis: Secondary | ICD-10-CM | POA: Diagnosis not present

## 2023-01-05 DIAGNOSIS — G311 Senile degeneration of brain, not elsewhere classified: Secondary | ICD-10-CM | POA: Diagnosis not present

## 2023-01-05 DIAGNOSIS — F0283 Dementia in other diseases classified elsewhere, unspecified severity, with mood disturbance: Secondary | ICD-10-CM | POA: Diagnosis not present

## 2023-01-05 DIAGNOSIS — E43 Unspecified severe protein-calorie malnutrition: Secondary | ICD-10-CM | POA: Diagnosis not present

## 2023-01-05 DIAGNOSIS — R296 Repeated falls: Secondary | ICD-10-CM | POA: Diagnosis not present

## 2023-01-05 DIAGNOSIS — F0284 Dementia in other diseases classified elsewhere, unspecified severity, with anxiety: Secondary | ICD-10-CM | POA: Diagnosis not present

## 2023-01-05 DIAGNOSIS — Z6820 Body mass index (BMI) 20.0-20.9, adult: Secondary | ICD-10-CM | POA: Diagnosis not present

## 2023-01-06 DIAGNOSIS — F0284 Dementia in other diseases classified elsewhere, unspecified severity, with anxiety: Secondary | ICD-10-CM | POA: Diagnosis not present

## 2023-01-06 DIAGNOSIS — R296 Repeated falls: Secondary | ICD-10-CM | POA: Diagnosis not present

## 2023-01-06 DIAGNOSIS — Z6820 Body mass index (BMI) 20.0-20.9, adult: Secondary | ICD-10-CM | POA: Diagnosis not present

## 2023-01-06 DIAGNOSIS — G311 Senile degeneration of brain, not elsewhere classified: Secondary | ICD-10-CM | POA: Diagnosis not present

## 2023-01-06 DIAGNOSIS — F0283 Dementia in other diseases classified elsewhere, unspecified severity, with mood disturbance: Secondary | ICD-10-CM | POA: Diagnosis not present

## 2023-01-06 DIAGNOSIS — E43 Unspecified severe protein-calorie malnutrition: Secondary | ICD-10-CM | POA: Diagnosis not present

## 2023-01-07 DIAGNOSIS — R296 Repeated falls: Secondary | ICD-10-CM | POA: Diagnosis not present

## 2023-01-07 DIAGNOSIS — E43 Unspecified severe protein-calorie malnutrition: Secondary | ICD-10-CM | POA: Diagnosis not present

## 2023-01-07 DIAGNOSIS — F0284 Dementia in other diseases classified elsewhere, unspecified severity, with anxiety: Secondary | ICD-10-CM | POA: Diagnosis not present

## 2023-01-07 DIAGNOSIS — F0283 Dementia in other diseases classified elsewhere, unspecified severity, with mood disturbance: Secondary | ICD-10-CM | POA: Diagnosis not present

## 2023-01-07 DIAGNOSIS — Z6820 Body mass index (BMI) 20.0-20.9, adult: Secondary | ICD-10-CM | POA: Diagnosis not present

## 2023-01-07 DIAGNOSIS — G311 Senile degeneration of brain, not elsewhere classified: Secondary | ICD-10-CM | POA: Diagnosis not present

## 2023-01-08 DIAGNOSIS — F0280 Dementia in other diseases classified elsewhere without behavioral disturbance: Secondary | ICD-10-CM | POA: Diagnosis not present

## 2023-01-08 DIAGNOSIS — R21 Rash and other nonspecific skin eruption: Secondary | ICD-10-CM | POA: Diagnosis not present

## 2023-01-08 DIAGNOSIS — I10 Essential (primary) hypertension: Secondary | ICD-10-CM | POA: Diagnosis not present

## 2023-01-08 DIAGNOSIS — I482 Chronic atrial fibrillation, unspecified: Secondary | ICD-10-CM | POA: Diagnosis not present

## 2023-01-09 DIAGNOSIS — H409 Unspecified glaucoma: Secondary | ICD-10-CM | POA: Diagnosis not present

## 2023-01-09 DIAGNOSIS — E43 Unspecified severe protein-calorie malnutrition: Secondary | ICD-10-CM | POA: Diagnosis not present

## 2023-01-09 DIAGNOSIS — I4891 Unspecified atrial fibrillation: Secondary | ICD-10-CM | POA: Diagnosis not present

## 2023-01-09 DIAGNOSIS — K219 Gastro-esophageal reflux disease without esophagitis: Secondary | ICD-10-CM | POA: Diagnosis not present

## 2023-01-09 DIAGNOSIS — R32 Unspecified urinary incontinence: Secondary | ICD-10-CM | POA: Diagnosis not present

## 2023-01-09 DIAGNOSIS — Z6820 Body mass index (BMI) 20.0-20.9, adult: Secondary | ICD-10-CM | POA: Diagnosis not present

## 2023-01-09 DIAGNOSIS — F0283 Dementia in other diseases classified elsewhere, unspecified severity, with mood disturbance: Secondary | ICD-10-CM | POA: Diagnosis not present

## 2023-01-09 DIAGNOSIS — E785 Hyperlipidemia, unspecified: Secondary | ICD-10-CM | POA: Diagnosis not present

## 2023-01-09 DIAGNOSIS — E538 Deficiency of other specified B group vitamins: Secondary | ICD-10-CM | POA: Diagnosis not present

## 2023-01-09 DIAGNOSIS — G40909 Epilepsy, unspecified, not intractable, without status epilepticus: Secondary | ICD-10-CM | POA: Diagnosis not present

## 2023-01-09 DIAGNOSIS — Z87440 Personal history of urinary (tract) infections: Secondary | ICD-10-CM | POA: Diagnosis not present

## 2023-01-09 DIAGNOSIS — M810 Age-related osteoporosis without current pathological fracture: Secondary | ICD-10-CM | POA: Diagnosis not present

## 2023-01-09 DIAGNOSIS — R296 Repeated falls: Secondary | ICD-10-CM | POA: Diagnosis not present

## 2023-01-09 DIAGNOSIS — Z8719 Personal history of other diseases of the digestive system: Secondary | ICD-10-CM | POA: Diagnosis not present

## 2023-01-09 DIAGNOSIS — F0284 Dementia in other diseases classified elsewhere, unspecified severity, with anxiety: Secondary | ICD-10-CM | POA: Diagnosis not present

## 2023-01-09 DIAGNOSIS — R159 Full incontinence of feces: Secondary | ICD-10-CM | POA: Diagnosis not present

## 2023-01-09 DIAGNOSIS — G311 Senile degeneration of brain, not elsewhere classified: Secondary | ICD-10-CM | POA: Diagnosis not present

## 2023-01-09 DIAGNOSIS — N1831 Chronic kidney disease, stage 3a: Secondary | ICD-10-CM | POA: Diagnosis not present

## 2023-01-09 DIAGNOSIS — D631 Anemia in chronic kidney disease: Secondary | ICD-10-CM | POA: Diagnosis not present

## 2023-01-09 DIAGNOSIS — Z741 Need for assistance with personal care: Secondary | ICD-10-CM | POA: Diagnosis not present

## 2023-01-09 DIAGNOSIS — L299 Pruritus, unspecified: Secondary | ICD-10-CM | POA: Diagnosis not present

## 2023-01-09 DIAGNOSIS — I129 Hypertensive chronic kidney disease with stage 1 through stage 4 chronic kidney disease, or unspecified chronic kidney disease: Secondary | ICD-10-CM | POA: Diagnosis not present

## 2023-01-12 DIAGNOSIS — E43 Unspecified severe protein-calorie malnutrition: Secondary | ICD-10-CM | POA: Diagnosis not present

## 2023-01-12 DIAGNOSIS — Z6820 Body mass index (BMI) 20.0-20.9, adult: Secondary | ICD-10-CM | POA: Diagnosis not present

## 2023-01-12 DIAGNOSIS — F0283 Dementia in other diseases classified elsewhere, unspecified severity, with mood disturbance: Secondary | ICD-10-CM | POA: Diagnosis not present

## 2023-01-12 DIAGNOSIS — R296 Repeated falls: Secondary | ICD-10-CM | POA: Diagnosis not present

## 2023-01-12 DIAGNOSIS — F0284 Dementia in other diseases classified elsewhere, unspecified severity, with anxiety: Secondary | ICD-10-CM | POA: Diagnosis not present

## 2023-01-12 DIAGNOSIS — G311 Senile degeneration of brain, not elsewhere classified: Secondary | ICD-10-CM | POA: Diagnosis not present

## 2023-01-13 DIAGNOSIS — R296 Repeated falls: Secondary | ICD-10-CM | POA: Diagnosis not present

## 2023-01-13 DIAGNOSIS — G311 Senile degeneration of brain, not elsewhere classified: Secondary | ICD-10-CM | POA: Diagnosis not present

## 2023-01-13 DIAGNOSIS — F0283 Dementia in other diseases classified elsewhere, unspecified severity, with mood disturbance: Secondary | ICD-10-CM | POA: Diagnosis not present

## 2023-01-13 DIAGNOSIS — F0284 Dementia in other diseases classified elsewhere, unspecified severity, with anxiety: Secondary | ICD-10-CM | POA: Diagnosis not present

## 2023-01-13 DIAGNOSIS — E43 Unspecified severe protein-calorie malnutrition: Secondary | ICD-10-CM | POA: Diagnosis not present

## 2023-01-13 DIAGNOSIS — Z6820 Body mass index (BMI) 20.0-20.9, adult: Secondary | ICD-10-CM | POA: Diagnosis not present

## 2023-01-14 DIAGNOSIS — Z6820 Body mass index (BMI) 20.0-20.9, adult: Secondary | ICD-10-CM | POA: Diagnosis not present

## 2023-01-14 DIAGNOSIS — R296 Repeated falls: Secondary | ICD-10-CM | POA: Diagnosis not present

## 2023-01-14 DIAGNOSIS — G311 Senile degeneration of brain, not elsewhere classified: Secondary | ICD-10-CM | POA: Diagnosis not present

## 2023-01-14 DIAGNOSIS — F0284 Dementia in other diseases classified elsewhere, unspecified severity, with anxiety: Secondary | ICD-10-CM | POA: Diagnosis not present

## 2023-01-14 DIAGNOSIS — E43 Unspecified severe protein-calorie malnutrition: Secondary | ICD-10-CM | POA: Diagnosis not present

## 2023-01-14 DIAGNOSIS — F0283 Dementia in other diseases classified elsewhere, unspecified severity, with mood disturbance: Secondary | ICD-10-CM | POA: Diagnosis not present

## 2023-01-16 DIAGNOSIS — Z6820 Body mass index (BMI) 20.0-20.9, adult: Secondary | ICD-10-CM | POA: Diagnosis not present

## 2023-01-16 DIAGNOSIS — F0284 Dementia in other diseases classified elsewhere, unspecified severity, with anxiety: Secondary | ICD-10-CM | POA: Diagnosis not present

## 2023-01-16 DIAGNOSIS — R296 Repeated falls: Secondary | ICD-10-CM | POA: Diagnosis not present

## 2023-01-16 DIAGNOSIS — E43 Unspecified severe protein-calorie malnutrition: Secondary | ICD-10-CM | POA: Diagnosis not present

## 2023-01-16 DIAGNOSIS — F0283 Dementia in other diseases classified elsewhere, unspecified severity, with mood disturbance: Secondary | ICD-10-CM | POA: Diagnosis not present

## 2023-01-16 DIAGNOSIS — G311 Senile degeneration of brain, not elsewhere classified: Secondary | ICD-10-CM | POA: Diagnosis not present

## 2023-01-19 DIAGNOSIS — F0284 Dementia in other diseases classified elsewhere, unspecified severity, with anxiety: Secondary | ICD-10-CM | POA: Diagnosis not present

## 2023-01-19 DIAGNOSIS — R296 Repeated falls: Secondary | ICD-10-CM | POA: Diagnosis not present

## 2023-01-19 DIAGNOSIS — E43 Unspecified severe protein-calorie malnutrition: Secondary | ICD-10-CM | POA: Diagnosis not present

## 2023-01-19 DIAGNOSIS — G311 Senile degeneration of brain, not elsewhere classified: Secondary | ICD-10-CM | POA: Diagnosis not present

## 2023-01-19 DIAGNOSIS — F0283 Dementia in other diseases classified elsewhere, unspecified severity, with mood disturbance: Secondary | ICD-10-CM | POA: Diagnosis not present

## 2023-01-19 DIAGNOSIS — Z6820 Body mass index (BMI) 20.0-20.9, adult: Secondary | ICD-10-CM | POA: Diagnosis not present

## 2023-01-20 DIAGNOSIS — F0284 Dementia in other diseases classified elsewhere, unspecified severity, with anxiety: Secondary | ICD-10-CM | POA: Diagnosis not present

## 2023-01-20 DIAGNOSIS — E43 Unspecified severe protein-calorie malnutrition: Secondary | ICD-10-CM | POA: Diagnosis not present

## 2023-01-20 DIAGNOSIS — Z6820 Body mass index (BMI) 20.0-20.9, adult: Secondary | ICD-10-CM | POA: Diagnosis not present

## 2023-01-20 DIAGNOSIS — F0283 Dementia in other diseases classified elsewhere, unspecified severity, with mood disturbance: Secondary | ICD-10-CM | POA: Diagnosis not present

## 2023-01-20 DIAGNOSIS — R296 Repeated falls: Secondary | ICD-10-CM | POA: Diagnosis not present

## 2023-01-20 DIAGNOSIS — G311 Senile degeneration of brain, not elsewhere classified: Secondary | ICD-10-CM | POA: Diagnosis not present

## 2023-01-21 DIAGNOSIS — G311 Senile degeneration of brain, not elsewhere classified: Secondary | ICD-10-CM | POA: Diagnosis not present

## 2023-01-21 DIAGNOSIS — F0284 Dementia in other diseases classified elsewhere, unspecified severity, with anxiety: Secondary | ICD-10-CM | POA: Diagnosis not present

## 2023-01-21 DIAGNOSIS — E43 Unspecified severe protein-calorie malnutrition: Secondary | ICD-10-CM | POA: Diagnosis not present

## 2023-01-21 DIAGNOSIS — F0283 Dementia in other diseases classified elsewhere, unspecified severity, with mood disturbance: Secondary | ICD-10-CM | POA: Diagnosis not present

## 2023-01-21 DIAGNOSIS — Z6820 Body mass index (BMI) 20.0-20.9, adult: Secondary | ICD-10-CM | POA: Diagnosis not present

## 2023-01-21 DIAGNOSIS — R296 Repeated falls: Secondary | ICD-10-CM | POA: Diagnosis not present

## 2023-01-23 ENCOUNTER — Emergency Department (HOSPITAL_COMMUNITY): Payer: Medicare Other

## 2023-01-23 ENCOUNTER — Encounter (HOSPITAL_COMMUNITY): Payer: Self-pay | Admitting: Emergency Medicine

## 2023-01-23 ENCOUNTER — Other Ambulatory Visit: Payer: Self-pay

## 2023-01-23 ENCOUNTER — Observation Stay (HOSPITAL_COMMUNITY)
Admission: EM | Admit: 2023-01-23 | Discharge: 2023-01-24 | Disposition: A | Discharge status: Home / Self Care | Payer: Medicare Other | Attending: Family Medicine | Admitting: Family Medicine

## 2023-01-23 DIAGNOSIS — G40802 Other epilepsy, not intractable, without status epilepticus: Secondary | ICD-10-CM | POA: Diagnosis not present

## 2023-01-23 DIAGNOSIS — I480 Paroxysmal atrial fibrillation: Secondary | ICD-10-CM | POA: Diagnosis present

## 2023-01-23 DIAGNOSIS — F0390 Unspecified dementia without behavioral disturbance: Secondary | ICD-10-CM | POA: Diagnosis present

## 2023-01-23 DIAGNOSIS — R000 Tachycardia, unspecified: Secondary | ICD-10-CM | POA: Diagnosis not present

## 2023-01-23 DIAGNOSIS — R404 Transient alteration of awareness: Secondary | ICD-10-CM | POA: Diagnosis not present

## 2023-01-23 DIAGNOSIS — I6381 Other cerebral infarction due to occlusion or stenosis of small artery: Secondary | ICD-10-CM | POA: Diagnosis not present

## 2023-01-23 DIAGNOSIS — G40901 Epilepsy, unspecified, not intractable, with status epilepticus: Principal | ICD-10-CM | POA: Insufficient documentation

## 2023-01-23 DIAGNOSIS — I10 Essential (primary) hypertension: Secondary | ICD-10-CM | POA: Diagnosis not present

## 2023-01-23 DIAGNOSIS — D649 Anemia, unspecified: Secondary | ICD-10-CM | POA: Diagnosis present

## 2023-01-23 DIAGNOSIS — R0902 Hypoxemia: Secondary | ICD-10-CM | POA: Diagnosis not present

## 2023-01-23 DIAGNOSIS — G311 Senile degeneration of brain, not elsewhere classified: Secondary | ICD-10-CM | POA: Diagnosis not present

## 2023-01-23 DIAGNOSIS — R4182 Altered mental status, unspecified: Secondary | ICD-10-CM | POA: Diagnosis not present

## 2023-01-23 DIAGNOSIS — Z6820 Body mass index (BMI) 20.0-20.9, adult: Secondary | ICD-10-CM | POA: Diagnosis not present

## 2023-01-23 DIAGNOSIS — R569 Unspecified convulsions: Secondary | ICD-10-CM

## 2023-01-23 DIAGNOSIS — R55 Syncope and collapse: Secondary | ICD-10-CM | POA: Diagnosis not present

## 2023-01-23 DIAGNOSIS — F0283 Dementia in other diseases classified elsewhere, unspecified severity, with mood disturbance: Secondary | ICD-10-CM | POA: Diagnosis not present

## 2023-01-23 DIAGNOSIS — Z79899 Other long term (current) drug therapy: Secondary | ICD-10-CM | POA: Insufficient documentation

## 2023-01-23 DIAGNOSIS — G40909 Epilepsy, unspecified, not intractable, without status epilepticus: Secondary | ICD-10-CM | POA: Diagnosis not present

## 2023-01-23 DIAGNOSIS — F0284 Dementia in other diseases classified elsewhere, unspecified severity, with anxiety: Secondary | ICD-10-CM | POA: Diagnosis not present

## 2023-01-23 DIAGNOSIS — E43 Unspecified severe protein-calorie malnutrition: Secondary | ICD-10-CM | POA: Diagnosis not present

## 2023-01-23 DIAGNOSIS — I2510 Atherosclerotic heart disease of native coronary artery without angina pectoris: Secondary | ICD-10-CM | POA: Diagnosis not present

## 2023-01-23 DIAGNOSIS — F32A Depression, unspecified: Secondary | ICD-10-CM | POA: Diagnosis not present

## 2023-01-23 DIAGNOSIS — R296 Repeated falls: Secondary | ICD-10-CM | POA: Diagnosis not present

## 2023-01-23 LAB — I-STAT CHEM 8, ED
BUN: 14 mg/dL (ref 8–23)
Calcium, Ion: 1.12 mmol/L — ABNORMAL LOW (ref 1.15–1.40)
Chloride: 104 mmol/L (ref 98–111)
Creatinine, Ser: 0.7 mg/dL (ref 0.44–1.00)
Glucose, Bld: 124 mg/dL — ABNORMAL HIGH (ref 70–99)
HCT: 36 % (ref 36.0–46.0)
Hemoglobin: 12.2 g/dL (ref 12.0–15.0)
Potassium: 4.2 mmol/L (ref 3.5–5.1)
Sodium: 141 mmol/L (ref 135–145)
TCO2: 27 mmol/L (ref 22–32)

## 2023-01-23 LAB — CBG MONITORING, ED: Glucose-Capillary: 120 mg/dL — ABNORMAL HIGH (ref 70–99)

## 2023-01-23 LAB — CBC WITH DIFFERENTIAL/PLATELET
Abs Immature Granulocytes: 0.14 10*3/uL — ABNORMAL HIGH (ref 0.00–0.07)
Basophils Absolute: 0.1 10*3/uL (ref 0.0–0.1)
Basophils Relative: 0 %
Eosinophils Absolute: 0.3 10*3/uL (ref 0.0–0.5)
Eosinophils Relative: 2 %
HCT: 37.2 % (ref 36.0–46.0)
Hemoglobin: 11.6 g/dL — ABNORMAL LOW (ref 12.0–15.0)
Immature Granulocytes: 1 %
Lymphocytes Relative: 8 %
Lymphs Abs: 1.3 10*3/uL (ref 0.7–4.0)
MCH: 29.6 pg (ref 26.0–34.0)
MCHC: 31.2 g/dL (ref 30.0–36.0)
MCV: 94.9 fL (ref 80.0–100.0)
Monocytes Absolute: 1 10*3/uL (ref 0.1–1.0)
Monocytes Relative: 6 %
Neutro Abs: 13.1 10*3/uL — ABNORMAL HIGH (ref 1.7–7.7)
Neutrophils Relative %: 83 %
Platelets: 276 10*3/uL (ref 150–400)
RBC: 3.92 MIL/uL (ref 3.87–5.11)
RDW: 14.9 % (ref 11.5–15.5)
WBC: 15.9 10*3/uL — ABNORMAL HIGH (ref 4.0–10.5)
nRBC: 0 % (ref 0.0–0.2)

## 2023-01-23 LAB — COMPREHENSIVE METABOLIC PANEL
ALT: 13 U/L (ref 0–44)
AST: 19 U/L (ref 15–41)
Albumin: 2.7 g/dL — ABNORMAL LOW (ref 3.5–5.0)
Alkaline Phosphatase: 72 U/L (ref 38–126)
Anion gap: 12 (ref 5–15)
BUN: 12 mg/dL (ref 8–23)
CO2: 25 mmol/L (ref 22–32)
Calcium: 8.8 mg/dL — ABNORMAL LOW (ref 8.9–10.3)
Chloride: 102 mmol/L (ref 98–111)
Creatinine, Ser: 0.89 mg/dL (ref 0.44–1.00)
GFR, Estimated: >60 mL/min (ref >60)
Glucose, Bld: 125 mg/dL — ABNORMAL HIGH (ref 70–99)
Potassium: 4.2 mmol/L (ref 3.5–5.1)
Sodium: 139 mmol/L (ref 135–145)
Total Bilirubin: 0.7 mg/dL (ref 0.3–1.2)
Total Protein: 6.9 g/dL (ref 6.5–8.1)

## 2023-01-23 LAB — MAGNESIUM: Magnesium: 1.3 mg/dL — ABNORMAL LOW (ref 1.7–2.4)

## 2023-01-23 LAB — VALPROIC ACID LEVEL: Valproic Acid Lvl: <10 ug/mL — ABNORMAL LOW (ref 50.0–100.0)

## 2023-01-23 MED ORDER — SODIUM CHLORIDE 0.9% FLUSH
3.0000 mL | Freq: Two times a day (BID) | INTRAVENOUS | Status: DC
  Administered 2023-01-24 (×2): 3 mL via INTRAVENOUS

## 2023-01-23 MED ORDER — SODIUM CHLORIDE 0.9 % IV SOLN: 60.0000 mg/kg | Freq: Once | INTRAVENOUS | Status: DC

## 2023-01-23 MED ORDER — LEVETIRACETAM IN NACL 500 MG/100ML IV SOLN
500.0000 mg | Freq: Two times a day (BID) | INTRAVENOUS | Status: DC
  Administered 2023-01-24: 500 mg via INTRAVENOUS
  Filled 2023-01-23: qty 100

## 2023-01-23 MED ORDER — ACETAMINOPHEN 325 MG PO TABS: 650.0000 mg | ORAL_TABLET | Freq: Four times a day (QID) | ORAL | Status: DC | PRN

## 2023-01-23 MED ORDER — MAGNESIUM SULFATE 2 GM/50ML IV SOLN
2.0000 g | Freq: Once | INTRAVENOUS | Status: AC
  Administered 2023-01-23: 2 g via INTRAVENOUS
  Filled 2023-01-23: qty 50

## 2023-01-23 MED ORDER — ENOXAPARIN SODIUM 40 MG/0.4ML IJ SOSY
40.0000 mg | PREFILLED_SYRINGE | INTRAMUSCULAR | Status: DC
  Administered 2023-01-24: 40 mg via SUBCUTANEOUS
  Filled 2023-01-23: qty 0.4

## 2023-01-23 MED ORDER — LEVETIRACETAM IN NACL 1500 MG/100ML IV SOLN
1500.0000 mg | Freq: Once | INTRAVENOUS | Status: AC
  Administered 2023-01-23: 1500 mg via INTRAVENOUS
  Filled 2023-01-23: qty 100

## 2023-01-23 MED ORDER — LORAZEPAM 2 MG/ML IJ SOLN: 2.0000 mg | INTRAMUSCULAR | Status: DC | PRN

## 2023-01-23 MED ORDER — ORAL CARE MOUTH RINSE
15.0000 mL | OROMUCOSAL | Status: DC
  Administered 2023-01-24 (×3): 15 mL via OROMUCOSAL

## 2023-01-23 MED ORDER — PANTOPRAZOLE SODIUM 40 MG PO TBEC
40.0000 mg | DELAYED_RELEASE_TABLET | Freq: Every day | ORAL | Status: DC
  Administered 2023-01-24: 40 mg via ORAL
  Filled 2023-01-23: qty 1

## 2023-01-23 MED ORDER — PREGABALIN 25 MG PO CAPS
50.0000 mg | ORAL_CAPSULE | Freq: Two times a day (BID) | ORAL | Status: DC
  Administered 2023-01-24: 50 mg via ORAL
  Filled 2023-01-23: qty 2

## 2023-01-23 MED ORDER — SODIUM CHLORIDE 0.9 % IV SOLN
2500.0000 mg | Freq: Once | INTRAVENOUS | Status: AC
  Administered 2023-01-23: 2500 mg via INTRAVENOUS
  Filled 2023-01-23: qty 15

## 2023-01-23 MED ORDER — POLYETHYLENE GLYCOL 3350 17 G PO PACK: 17.0000 g | PACK | Freq: Every day | ORAL | Status: DC | PRN

## 2023-01-23 MED ORDER — ACETAMINOPHEN 650 MG RE SUPP: 650.0000 mg | Freq: Four times a day (QID) | RECTAL | Status: DC | PRN

## 2023-01-23 MED ORDER — CHLORHEXIDINE GLUCONATE 0.12% ORAL RINSE (MEDLINE KIT): 15.0000 mL | Freq: Two times a day (BID) | OROMUCOSAL | Status: DC

## 2023-01-23 NOTE — ED Triage Notes (Signed)
Pt is here from nursing home ,( [facility]) for seizure , pt seized for  approx 30 mins prior to ems arrival , pt received 5mg  Im versed and 5mg  Iv versed by ems , pt has hx of seizure , pt being assisted ventilations on arrival

## 2023-01-23 NOTE — ED Provider Notes (Signed)
[facility] Provider Note   CSN: [id number] Arrival date & time: 01/23/23  1417     History  Chief Complaint  Patient presents with   Seizures    Kendra Smith is a 89 y.o. female.  HPI 89 year old female presents with seizures. History is initially from EMS and later the daughter at the bedside.  Patient has had at least 30 minutes of seizure starting at the facility.  Patient is a DNR.  EMS had to give 5 IM Versed and later were able to give an IV and give 5 total more milligrams of IV Versed.  The seizing has been better though EMS has noticed some mild left hand twitching intermittently.  The patient has a history of seizures according to the [facility].  EMS reports that she has been hypertensive and at times they were bagging her though currently she is breathing on her own.  Daughter reports she was talking to patient and she started having left sided twitching in face and arm. Eventually seemed to generalize.  Has a prior history of seizures though patient has never had a seizure in front of the daughter.  Home Medications Prior to Admission medications   Medication Sig Start Date End Date Taking? Authorizing Provider  acetaminophen (TYLENOL) 500 MG tablet Take 1,000 mg by mouth every 6 (six) hours as needed for mild pain, fever or headache.     [provider]  calcitonin, salmon, (MIACALCIN/FORTICAL) 200 UNIT/ACT nasal spray Place 1 spray into alternate nostrils daily.    [provider]  calcium carbonate (OSCAL) 1500 (600 Ca) MG TABS tablet Take 600 mg of elemental calcium by mouth daily with breakfast.    [provider]  cetirizine (ZYRTEC) 10 MG tablet Take 10 mg by mouth daily.    [provider]  cholecalciferol (VITAMIN D3) 25 MCG (1000 UT) tablet Take 1,000 Units by mouth daily with breakfast.     [provider]  DESITIN 40 % PSTE Apply 1 application topically in the morning and  at bedtime. To buttocks 11/05/20   [provider]  digoxin (LANOXIN) 0.125 MG tablet Take 0.125 mg by mouth every other day.     [provider]  donepezil (ARICEPT) 5 MG tablet Take 5 mg by mouth at bedtime. 10/15/18   [provider]  DULoxetine (CYMBALTA) 30 MG capsule Take 30 mg by mouth daily. 12/11/20   [provider]  ferrous sulfate 325 (65 FE) MG tablet Take 1 tablet (325 mg total) by mouth daily with breakfast. 06/22/21 07/22/21  Elodia Florence., MD  HYDROcodone-acetaminophen (NORCO/VICODIN) 5-325 MG tablet Take 1-2 tablets by mouth every 6 (six) hours as needed. 01/07/21   She[facility], [facility] [place]) Apply 1 application topically See admin instructions. Dermacloud ointment- Apply to buttocks after each incontinent episode    [provider]  latanoprost (XALATAN) 0.005 % ophthalmic solution Place 1 drop into both eyes at bedtime.    [provider]  levETIRAcetam (KEPPRA) 500 MG tablet Take 500 mg by mouth 2 (two) times daily.    [provider]  Nystatin (GERHARDT'S BUTT CREAM) CREA Apply 1 application topically 2 (two) times daily. 01/07/21   Raiford Noble Latif, DO  ondansetron (ZOFRAN) 4 MG tablet Take 1 tablet (4 mg total) by mouth every 6 (six) hours as needed for nausea. 01/07/21   Raiford Noble Latif, DO  pantoprazole (PROTONIX) 40  MG tablet Take 40 mg by mouth daily.    [provider]  pregabalin (LYRICA) 50 MG capsule Take 50 mg by mouth 2 (two) times daily.    [provider]  saccharomyces boulardii (FLORASTOR) 250 MG capsule Take 1 capsule (250 mg total) by mouth 2 (two) times daily. 01/07/21   Raiford Noble Latif, DO  timolol (TIMOPTIC) 0.5 % ophthalmic solution Place 1 drop into both eyes daily. 06/15/14   [provider]  traMADol (ULTRAM) 50 MG tablet Take 1 tablet (50 mg total) by mouth every 8 (eight) hours as needed for severe pain. 01/07/21   Raiford Noble Latif, DO  traZODone (DESYREL) 50 MG tablet Take 25 mg by mouth every evening.  10/15/18   [provider]      Allergies    Ciprofloxacin hcl, Effexor [venlafaxine], Gantrisin [sulfisoxazole], Levaquin [levofloxacin], and Sulfa antibiotics    Review of Systems   Review of Systems  Unable to perform ROS: Mental status change    Physical Exam Updated Vital Signs Pulse (!) 102   Resp (!) 23   Wt 68 kg   SpO2 100%   BMI 29.29 kg/m  Physical Exam Vitals and nursing note reviewed.  Constitutional:      Appearance: She is well-developed. She is ill-appearing.  HENT:     Head: Normocephalic and atraumatic.  Eyes:     Pupils: Pupils are equal, round, and reactive to light.     Comments: Eyes deviated to the left  Cardiovascular:     Rate and Rhythm: Regular rhythm. Tachycardia present.     Heart sounds: Normal heart sounds.  Pulmonary:     Effort: Pulmonary effort is normal.     Breath sounds: Normal breath sounds.     Comments: Breathing about 25 times a minute Abdominal:     General: There is no distension.     Palpations: Abdomen is soft.  Skin:    General: Skin is warm and dry.  Neurological:     Mental Status: She is unresponsive.     Comments: Patient seems to withdraw from pain in her right upper and right lower extremity but not on the left.  There seems to be some subtle twitching to her left eye, face, and arm.     ED Results / Procedures / Treatments   Labs (all labs ordered are listed, but only abnormal results are displayed) Labs Reviewed  COMPREHENSIVE METABOLIC PANEL - Abnormal; Notable for the following components:      Result Value   Glucose, Bld 125 (*)    Calcium 8.8 (*)    Albumin 2.7 (*)    All other components within normal limits  CBC WITH DIFFERENTIAL/PLATELET - Abnormal; Notable for the following components:   WBC 15.9 (*)    Hemoglobin 11.6 (*)    Neutro Abs 13.1 (*)    Abs Immature Granulocytes 0.14 (*)    All other  components within normal limits  MAGNESIUM - Abnormal; Notable for the following components:   Magnesium 1.3 (*)    All other components within normal limits  CBG MONITORING, ED - Abnormal; Notable for the following components:   Glucose-Capillary 120 (*)    All other components within normal limits  I-STAT CHEM 8, ED - Abnormal; Notable for the following components:   Glucose, Bld 124 (*)    Calcium, Ion 1.12 (*)    All other components within normal limits  LEVETIRACETAM LEVEL  VALPROIC ACID LEVEL  CBG MONITORING,  ED    EKG EKG Interpretation  Date/Time:  Friday January 23 2023 14:34:55 EDT Ventricular Rate:  107 PR Interval:  170 QRS Duration: 118 QT Interval:  371 QTC Calculation: 495 R Axis:   -59 Text Interpretation: Sinus tachycardia Incomplete right bundle branch block LVH with IVCD, LAD and secondary repol abnrm Borderline prolonged QT interval Confirmed by Bale Gambler [phone]) on 01/23/2023 3:42:05 PM  Radiology CT HEAD WO CONTRAST  Result Date: 01/23/2023 CLINICAL DATA:  Altered mental status. EXAM: CT HEAD WITHOUT CONTRAST TECHNIQUE: Contiguous axial images were obtained from the base of the skull through the vertex without intravenous contrast. RADIATION DOSE REDUCTION: This exam was performed according to the departmental dose-optimization program which includes automated exposure control, adjustment of the mA and/or kV according to patient size and/or use of iterative reconstruction technique. COMPARISON:  CT head dated January 28, 2021. FINDINGS: Brain: No evidence of acute infarction, hemorrhage, hydrocephalus, extra-axial collection or mass lesion/mass effect. Stable atrophy and chronic microvascular ischemic changes. 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 Vascular: Calcified atherosclerosis at the skull base. No hyperdense vessel. Skull: Normal. Negative for fracture or focal lesion. Sinuses/Orbits: No acute finding. Other: None. IMPRESSION: 1. No  acute intracranial abnormality. Stable atrophy and chronic microvascular ischemic changes. Electronically Signed   By: Titus Dubin M.D.   On: 01/23/2023 15:32    Procedures .Critical Care  Performed by: Foody Gambler, MD Authorized by: Derrington Gambler, MD   Critical care provider statement:    Critical care time (minutes):  35   Critical care time was exclusive of:  Separately billable procedures and treating other patients   Critical care was necessary to treat or prevent imminent or life-threatening deterioration of the following conditions:  CNS failure or compromise   Critical care was time spent personally by me on the following activities:  Development of treatment plan with patient or surrogate, discussions with consultants, evaluation of patient's response to treatment, examination of patient, ordering and review of laboratory studies, ordering and review of radiographic studies, ordering and performing treatments and interventions, pulse oximetry, re-evaluation of patient's condition and review of old charts     Medications Ordered in ED Medications  levETIRAcetam (KEPPRA) IVPB 1500 mg/ 100 mL premix (0 mg Intravenous Stopped 01/23/23 1505)    And  levETIRAcetam (KEPPRA) 2,500 mg in sodium chloride 0.9 % 250 mL IVPB (2,500 mg Intravenous New Bag/Given 01/23/23 1449)    ED Course/ Medical Decision Making/ A&P                             Medical Decision Making Amount and/or Complexity of Data Reviewed Labs: ordered.    Details: Leukocytosis, probably reactive to status epilepticus.  No hypoglycemia or hyponatremia Radiology: ordered. ECG/medicine tests: ordered and independent interpretation performed.    Details: Sinus tachycardia  Risk Prescription drug management.   Patient presents with status epilepticus.  She was loaded with IV Keppra, after discussion with pharmacy we loaded with 4 g.  After the first 1500 which were more easily accessed in the emergency  department she seems to stop seizing.  Seems to be responding to pain.  I think part of her current unresponsiveness is from sedation from benzos as well as postictal state.  Had long discussion with daughter at the bedside.  She is upholding the DNR.  It will depend on how patient responds.  The ideal would be for patient to go back  to her facility under hospice care and at [facility].  However she needed to be admitted for seizures that would be okay as well.  For now patient is protecting her airway though requiring 2 L.  Will get head CT though no other obvious metabolic cause such as hyponatremia or hypoglycemia.  Care transferred to Dr. Vanita Panda.  Of note I did talk to neurology briefly, Dr. Lorrin Goodell.  For now he is not sure LTM EEG would be helpful as the patient likely would not be able to tolerate deeper sedation, especially without an airway, and especially with patient's preferences.  For now Keppra is good but if needs more would go with 1 g Depakote.        Final Clinical Impression(s) / ED Diagnoses Final diagnoses:  Status epilepticus [facility])    Rx / DC Orders ED Discharge Orders     None         Lobo Gambler, MD 01/23/23 1544

## 2023-01-23 NOTE — Progress Notes (Signed)
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  The [facility] Team will follow this patient during this hospitalization.  Please reach out with any hospice related questions.  Gar Ponto, RN [facility] ( on [place]) [phone]

## 2023-01-23 NOTE — H&P (Addendum)
History and Physical   Kendra Smith [id number] DOB: [birth year] DOA: 01/23/2023  PCP: Seward Carol, MD   Patient coming from: [facility]  Chief Complaint: Seizures  HPI: Kendra Smith is a 89 y.o. female with medical history significant of Seizure disorder, Atrial fibrillation, HTN, Dementia, Depression, Anemia, EKG 3a presenting after seizures at facility.  History obtained with assistance of chart review and family.  Patient was at facility where she had at least 30 minutes of seizure activity.  Family reports that it seemed to start with left face and arm twitching and then became more generalized.  EMS was called and patient received 5 mg IM Versed followed later by 5 mg IV Versed.  There was improvement in seizure activity but they did notice some continued hand twitching.  Patient is postictal in the ED.  Unable to obtain review of systems due to patient's dementia and postictal state.  Patient is DNR involved with hospice.  Daughter at bedside wishes to continue to enforce patient's wishes and patient is remaining DNR/DNI.  ED Course: Vital signs in the ED significant for blood pressure in the 170s, heart rate in the 90s to 100s, respiratory rate in the 20s.  Lab workup included CMP with glucose 125, calcium 8.8, albumin 2.3.  CBC with leukocytosis to 15.9 and hemoglobin stable at 11.6.  Valproic acid level less than 10.  Magnesium 1.3.  CT head showed no acute abnormality.  Patient received Keppra and 2 g IV magnesium in the ED.  Case was discussed with neurology who felt that patient was less likely to benefit from LTM EEG or deeper sedation given her current wishes of being DNR and DNI with deeper sedation putting patient at risk of being unable to protect airway.  They agree with continue with Keppra and if needed adding Depakote.  Review of Systems: Unable to obtain review of systems due to patient's dementia and postictal state.  Past Medical History:  Diagnosis  Date   Dementia ([place])    Depression    Glaucoma    Hyperlipidemia    Hypertension    Osteoporosis     Past Surgical History:  Procedure Laterality Date   ABDOMINAL HYSTERECTOMY     ANKLE ARTHROSCOPY     CHOLECYSTECTOMY     EYE SURGERY     RECTOCELE REPAIR      Social History  reports that she has never smoked. She has never used smokeless tobacco. She reports that she does not drink alcohol and does not use drugs.  Allergies  Allergen Reactions   Ciprofloxacin Hcl Other (See Comments)    On MAR   Effexor [Venlafaxine] Other (See Comments)    On MAR   Gantrisin [Sulfisoxazole] Other (See Comments)    On MAR   Levaquin [Levofloxacin] Other (See Comments)    On MAR   Sulfa Antibiotics Rash    History reviewed. No pertinent family history.   Prior to Admission medications   Medication Sig Start Date End Date Taking? Authorizing Provider  acetaminophen (TYLENOL) 500 MG tablet Take 1,000 mg by mouth every 6 (six) hours as needed for mild pain, fever or headache.     [provider]  calcitonin, salmon, (MIACALCIN/FORTICAL) 200 UNIT/ACT nasal spray Place 1 spray into alternate nostrils daily.    [provider]  calcium carbonate (OSCAL) 1500 (600 Ca) MG TABS tablet Take 600 mg of elemental calcium by mouth daily with breakfast.    [provider]  cetirizine (ZYRTEC) 10 MG  tablet Take 10 mg by mouth daily.    [provider]  cholecalciferol (VITAMIN D3) 25 MCG (1000 UT) tablet Take 1,000 Units by mouth daily with breakfast.     [provider]  DESITIN 40 % PSTE Apply 1 application topically in the morning and at bedtime. To buttocks 11/05/20   [provider]  digoxin (LANOXIN) 0.125 MG tablet Take 0.125 mg by mouth every other day.     [provider]  donepezil (ARICEPT) 5 MG tablet Take 5 mg by mouth at bedtime. 10/15/18   [provider]  DULoxetine (CYMBALTA) 30 MG capsule Take 30 mg by mouth daily.  12/11/20   [provider]  ferrous sulfate 325 (65 FE) MG tablet Take 1 tablet (325 mg total) by mouth daily with breakfast. 06/22/21 07/22/21  Elodia Florence., MD  HYDROcodone-acetaminophen (NORCO/VICODIN) 5-325 MG tablet Take 1-2 tablets by mouth every 6 (six) hours as needed. 01/07/21   Sheikh, [facility] [place]) Apply 1 application topically See admin instructions. Dermacloud ointment- Apply to buttocks after each incontinent episode    [provider]  latanoprost (XALATAN) 0.005 % ophthalmic solution Place 1 drop into both eyes at bedtime.    [provider]  levETIRAcetam (KEPPRA) 500 MG tablet Take 500 mg by mouth 2 (two) times daily.    [provider]  Nystatin (GERHARDT'S BUTT CREAM) CREA Apply 1 application topically 2 (two) times daily. 01/07/21   Raiford Noble Latif, DO  ondansetron (ZOFRAN) 4 MG tablet Take 1 tablet (4 mg total) by mouth every 6 (six) hours as needed for nausea. 01/07/21   Raiford Noble Latif, DO  pantoprazole (PROTONIX) 40 MG tablet Take 40 mg by mouth daily.    [provider]  pregabalin (LYRICA) 50 MG capsule Take 50 mg by mouth 2 (two) times daily.    [provider]  saccharomyces boulardii (FLORASTOR) 250 MG capsule Take 1 capsule (250 mg total) by mouth 2 (two) times daily. 01/07/21   Raiford Noble Latif, DO  timolol (TIMOPTIC) 0.5 % ophthalmic solution Place 1 drop into both eyes daily. 06/15/14   [provider]  traMADol (ULTRAM) 50 MG tablet Take 1 tablet (50 mg total) by mouth every 8 (eight) hours as needed for severe pain. 01/07/21   Raiford Noble Latif, DO  traZODone (DESYREL) 50 MG tablet Take 25 mg by mouth every evening.  10/15/18   [provider]    Physical Exam: Vitals:   01/23/23 1420 01/23/23 1515 01/23/23 1600 01/23/23 1715  BP:   (!) 171/74 (!) 178/72  Pulse:  (!) 102 100 99  Resp:  (!) 23 (!) 22 (!) 23  SpO2:  100% 100% 99%  Weight:  68 kg       Physical Exam Constitutional:      General: She is not in acute distress.    Comments: Postictal  HENT:     Head: Normocephalic and atraumatic.     Mouth/Throat:     Mouth: Mucous membranes are moist.     Pharynx: Oropharynx is clear.  Eyes:     Extraocular Movements: Extraocular movements intact.     Pupils: Pupils are equal, round, and reactive to light.  Cardiovascular:     Rate and Rhythm: Normal rate and regular rhythm.     Pulses: Normal pulses.     Heart sounds: Normal heart sounds.  Pulmonary:     Effort: Pulmonary effort is normal. No  respiratory distress.     Breath sounds: Normal breath sounds.  Abdominal:     General: Bowel sounds are normal. There is no distension.     Palpations: Abdomen is soft.     Tenderness: There is no abdominal tenderness.  Musculoskeletal:        General: No swelling or deformity.  Skin:    General: Skin is warm and dry.  Neurological:     General: No focal deficit present.     Comments: Postictal     Labs on Admission: I have personally reviewed following labs and imaging studies  CBC: Recent Labs  Lab 01/23/23 1432 01/23/23 1446  WBC 15.9*  --   NEUTROABS 13.1*  --   HGB 11.6* 12.2  HCT 37.2 36.0  MCV 94.9  --   PLT 276  --     Basic Metabolic Panel: Recent Labs  Lab 01/23/23 1432 01/23/23 1446  NA 139 141  K 4.2 4.2  CL 102 104  CO2 25  --   GLUCOSE 125* 124*  BUN 12 14  CREATININE 0.89 0.70  CALCIUM 8.8*  --   MG 1.3*  --     GFR: CrCl cannot be calculated (Unknown ideal weight.).  Liver Function Tests: Recent Labs  Lab 01/23/23 1432  AST 19  ALT 13  ALKPHOS 72  BILITOT 0.7  PROT 6.9  ALBUMIN 2.7*    Urine analysis:    Component Value Date/Time   COLORURINE YELLOW 01/03/2021 1851   APPEARANCEUR HAZY (A) 01/03/2021 1851   LABSPEC 1.020 01/03/2021 1851   PHURINE 5.0 01/03/2021 1851   GLUCOSEU NEGATIVE 01/03/2021 [place] 01/03/2021 [place]  01/03/2021 [place] 01/03/2021 1851   PROTEINUR 30 (A) 01/03/2021 1851   UROBILINOGEN 1.0 06/22/2011 1224   NITRITE NEGATIVE 01/03/2021 1851   LEUKOCYTESUR MODERATE (A) 01/03/2021 1851    Radiological Exams on Admission: CT HEAD WO CONTRAST  Result Date: 01/23/2023 CLINICAL DATA:  Altered mental status. EXAM: CT HEAD WITHOUT CONTRAST TECHNIQUE: Contiguous axial images were obtained from the base of the skull through the vertex without intravenous contrast. RADIATION DOSE REDUCTION: This exam was performed according to the departmental dose-optimization program which includes automated exposure control, adjustment of the mA and/or kV according to patient size and/or use of iterative reconstruction technique. COMPARISON:  CT head dated January 28, 2021. FINDINGS: Brain: No evidence of acute infarction, hemorrhage, hydrocephalus, extra-axial collection or mass lesion/mass effect. Stable atrophy and chronic microvascular ischemic changes. 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 Vascular: Calcified atherosclerosis at the skull base. No hyperdense vessel. Skull: Normal. Negative for fracture or focal lesion. Sinuses/Orbits: No acute finding. Other: None. IMPRESSION: 1. No acute intracranial abnormality. Stable atrophy and chronic microvascular ischemic changes. Electronically Signed   By: Titus Dubin M.D.   On: 01/23/2023 15:32    EKG: Independently reviewed.  Sinus tachycardia at 107 bpm.  Incomplete right bundle branch block with QRS of 118.  Borderline QTc at 495.  Assessment/Plan Active Problems:   Depression   Hypertension   Dementia (HCC)   PAF (paroxysmal atrial fibrillation) (HCC)   Anemia   Seizures > Known history of seizures.  At least 30+ minutes of status epilepticus.  Remains postictal. > Patient is DNR and DNI which continues to stand per family.  This would make deeper sedation risky for her.  Neurology also was contacted by EDP in addition to  less likely to tolerate  deeper sedation they feel it is less likely to benefit from LTM EEG. > Patient received Keppra load in the ED which neurology agreed with and agree with continuing [place].  If patient needs additional antiepileptic recommend Depakote. - Monitor on progressive unit - Continue IV Keppra - As needed IV Ativan - Seizure precautions - Neuro Checks - Supportive care - N.p.o. for now  Atrial fibrillation - Continue home digoxin when able to tolerate p.o.  Dementia Depression > Patient has been agitated in the hospital in the past.  When she is no longer postictal this may recur.  Daughter is available if needed. - Continue home duloxetine, donepezil, trazodone when able to tolerate p.o.  Hypertension > Blood pressure in the 170s in the ED. - Consider as needed antihypertensives if persistently elevated greater than [id number] systolic.   DVT prophylaxis: Lovenox Code Status:   DNR/DNI Family Communication:  Updated at bedside Disposition Plan:   Patient is from:  [facility]  Anticipated DC to:  Same as above  Anticipated DC date:  1 to 2 days  Anticipated DC barriers: None  Consults called:  Discussed with neurology in the ED by EDP, but not following. Admission status:  Observation, progressive  Severity of Illness: The appropriate patient status for this patient is OBSERVATION. Observation status is judged to be reasonable and necessary in order to provide the required intensity of service to ensure the patient's safety. The patient's presenting symptoms, physical exam findings, and initial radiographic and laboratory data in the context of their medical condition is felt to place them at decreased risk for further clinical deterioration. Furthermore, it is anticipated that the patient will be medically stable for discharge from the hospital within 2 midnights of admission.    Marcelyn Bruins MD [facility]  How to contact the [facility] Attending or  Consulting provider [place] or covering provider during after hours [place], for this patient?   Check the care team in [facility] and look for a) attending/consulting [facility] provider listed and b) the [facility] team listed Log into [email or web address] and use [place] universal password to access. If you do not have the password, please contact the hospital operator. Locate the [facility] provider you are looking for under [facility] and page to a number that you can be directly reached. If you still have difficulty reaching the provider, please page the [facility] (Director on Call) for the Hospitalists listed on amion for assistance.  01/23/2023, 6:10 PM

## 2023-01-23 NOTE — ED Provider Notes (Signed)
I reviewed the patient's remaining findings with the patient and her daughter at bedside.  With concern for still not returning to her baseline she was admitted to the hospitalist team.   Carmin Muskrat, MD 01/23/23 2350

## 2023-01-24 DIAGNOSIS — G40901 Epilepsy, unspecified, not intractable, with status epilepticus: Secondary | ICD-10-CM | POA: Diagnosis not present

## 2023-01-24 DIAGNOSIS — F0284 Dementia in other diseases classified elsewhere, unspecified severity, with anxiety: Secondary | ICD-10-CM | POA: Diagnosis not present

## 2023-01-24 DIAGNOSIS — R569 Unspecified convulsions: Secondary | ICD-10-CM | POA: Diagnosis not present

## 2023-01-24 DIAGNOSIS — R296 Repeated falls: Secondary | ICD-10-CM | POA: Diagnosis not present

## 2023-01-24 DIAGNOSIS — G311 Senile degeneration of brain, not elsewhere classified: Secondary | ICD-10-CM | POA: Diagnosis not present

## 2023-01-24 DIAGNOSIS — Z6820 Body mass index (BMI) 20.0-20.9, adult: Secondary | ICD-10-CM | POA: Diagnosis not present

## 2023-01-24 DIAGNOSIS — F0283 Dementia in other diseases classified elsewhere, unspecified severity, with mood disturbance: Secondary | ICD-10-CM | POA: Diagnosis not present

## 2023-01-24 DIAGNOSIS — E43 Unspecified severe protein-calorie malnutrition: Secondary | ICD-10-CM | POA: Diagnosis not present

## 2023-01-24 LAB — CBC
HCT: 36.6 % (ref 36.0–46.0)
Hemoglobin: 11.3 g/dL — ABNORMAL LOW (ref 12.0–15.0)
MCH: 29.1 pg (ref 26.0–34.0)
MCHC: 30.9 g/dL (ref 30.0–36.0)
MCV: 94.3 fL (ref 80.0–100.0)
Platelets: 270 10*3/uL (ref 150–400)
RBC: 3.88 MIL/uL (ref 3.87–5.11)
RDW: 15.1 % (ref 11.5–15.5)
WBC: 11.2 10*3/uL — ABNORMAL HIGH (ref 4.0–10.5)
nRBC: 0 % (ref 0.0–0.2)

## 2023-01-24 LAB — COMPREHENSIVE METABOLIC PANEL
ALT: 10 U/L (ref 0–44)
AST: 14 U/L — ABNORMAL LOW (ref 15–41)
Albumin: 2.7 g/dL — ABNORMAL LOW (ref 3.5–5.0)
Alkaline Phosphatase: 69 U/L (ref 38–126)
Anion gap: 12 (ref 5–15)
BUN: 9 mg/dL (ref 8–23)
CO2: 22 mmol/L (ref 22–32)
Calcium: 8.7 mg/dL — ABNORMAL LOW (ref 8.9–10.3)
Chloride: 101 mmol/L (ref 98–111)
Creatinine, Ser: 0.76 mg/dL (ref 0.44–1.00)
GFR, Estimated: >60 mL/min (ref >60)
Glucose, Bld: 115 mg/dL — ABNORMAL HIGH (ref 70–99)
Potassium: 3.5 mmol/L (ref 3.5–5.1)
Sodium: 135 mmol/L (ref 135–145)
Total Bilirubin: 0.7 mg/dL (ref 0.3–1.2)
Total Protein: 6.6 g/dL (ref 6.5–8.1)

## 2023-01-24 MED ORDER — VITAMIN B-12 1000 MCG PO TABS
1000.0000 ug | ORAL_TABLET | Freq: Every day | ORAL | Status: DC
  Administered 2023-01-24: 1000 ug via ORAL
  Filled 2023-01-24: qty 1

## 2023-01-24 MED ORDER — FERROUS SULFATE 325 (65 FE) MG PO TABS
325.0000 mg | ORAL_TABLET | Freq: Every day | ORAL | Status: DC
  Administered 2023-01-24: 325 mg via ORAL
  Filled 2023-01-24: qty 1

## 2023-01-24 MED ORDER — HYDROCODONE-ACETAMINOPHEN 5-325 MG PO TABS: 1.0000 | ORAL_TABLET | Freq: Four times a day (QID) | ORAL | Status: DC | PRN

## 2023-01-24 MED ORDER — HYDROCODONE-ACETAMINOPHEN 5-325 MG PO TABS: 1.0000 | ORAL_TABLET | Freq: Four times a day (QID) | ORAL | 0 refills | Status: AC | PRN

## 2023-01-24 MED ORDER — DONEPEZIL HCL 10 MG PO TABS: 5.0000 mg | ORAL_TABLET | Freq: Every day | ORAL | Status: DC

## 2023-01-24 MED ORDER — HYDROXYZINE HCL 25 MG PO TABS
25.0000 mg | ORAL_TABLET | Freq: Three times a day (TID) | ORAL | Status: DC
  Administered 2023-01-24: 25 mg via ORAL
  Filled 2023-01-24: qty 1

## 2023-01-24 MED ORDER — DULOXETINE HCL 30 MG PO CPEP
30.0000 mg | ORAL_CAPSULE | Freq: Every day | ORAL | Status: DC
  Administered 2023-01-24: 30 mg via ORAL
  Filled 2023-01-24: qty 1

## 2023-01-24 MED ORDER — DIVALPROEX SODIUM 125 MG PO CSDR: 125.0000 mg | DELAYED_RELEASE_CAPSULE | Freq: Every day | ORAL | Status: DC

## 2023-01-24 MED ORDER — DIGOXIN 125 MCG PO TABS
0.1250 mg | ORAL_TABLET | ORAL | Status: DC
  Administered 2023-01-24: 0.125 mg via ORAL
  Filled 2023-01-24: qty 1

## 2023-01-24 MED ORDER — ACETAMINOPHEN 500 MG PO TABS: 1000.0000 mg | ORAL_TABLET | Freq: Three times a day (TID) | ORAL | Status: DC

## 2023-01-24 MED ORDER — HYDROCODONE-ACETAMINOPHEN 5-325 MG PO TABS: 1.0000 | ORAL_TABLET | Freq: Four times a day (QID) | ORAL | 0 refills | Status: DC | PRN

## 2023-01-24 NOTE — Discharge Summary (Signed)
Physician Discharge Summary  Kendra Smith [id number] DOB: [birth year] DOA: 01/23/2023  PCP: Seward Carol, MD  Admit date: 01/23/2023 Discharge date: 01/24/2023    Admitted From: [facility] Disposition: [facility]  Recommendations for Outpatient Follow-up:  Follow up with PCP in 1-2 weeks Please obtain BMP/CBC in one week Please follow up with your PCP on the following pending results: Unresulted Labs (From admission, onward)     Start     Ordered   01/30/23 0500  Creatinine, serum  (enoxaparin (LOVENOX)    CrCl >/= 30 ml/min)  Weekly,   R     Comments: while on enoxaparin therapy    01/23/23 1815   01/23/23 1422  Keppra (Levetiracetam) level  Once,   URGENT        01/23/23 1422              Home Health: None Equipment/Devices: None  Discharge Condition: Stable CODE STATUS: DNR Diet recommendation: Cardiac  Following HPI and ED course is copied from admitting hospitalist H&P. HPI: Kendra Smith is a 89 y.o. female with medical history significant of Seizure disorder, Atrial fibrillation, HTN, Dementia, Depression, Anemia, EKG 3a presenting after seizures at facility.   History obtained with assistance of chart review and family.  Patient was at facility where she had at least 30 minutes of seizure activity.  Family reports that it seemed to start with left face and arm twitching and then became more generalized.  EMS was called and patient received 5 mg IM Versed followed later by 5 mg IV Versed.  There was improvement in seizure activity but they did notice some continued hand twitching.  Patient is postictal in the ED.   Unable to obtain review of systems due to patient's dementia and postictal state.   Patient is DNR involved with hospice.  Daughter at bedside wishes to continue to enforce patient's wishes and patient is remaining DNR/DNI.   ED Course: Vital signs in the ED significant for blood pressure in the 170s, heart rate in the 90s to 100s,  respiratory rate in the 20s.  Lab workup included CMP with glucose 125, calcium 8.8, albumin 2.3.  CBC with leukocytosis to 15.9 and hemoglobin stable at 11.6.  Valproic acid level less than 10.  Magnesium 1.3.  CT head showed no acute abnormality.  Patient received Keppra and 2 g IV magnesium in the ED.  Case was discussed with neurology who felt that patient was less likely to benefit from LTM EEG or deeper sedation given her current wishes of being DNR and DNI with deeper sedation putting patient at risk of being unable to protect airway.  They agree with continue with Keppra and if needed adding Depakote.  Subjective: Seen and examined.  She was still slightly confused this morning however I was informed by nurses at 10:40 AM that her daughter has come to see her at the bedside and her daughter has confirmed that the patient is back at her baseline and is requesting to discharge the patient back to facility.  Brief/Interim Summary: Patient was admitted due to an episode of seizure at the facility.  She was loaded with Keppra and resumed her home dose of Keppra 500 mg p.o. twice daily.  ED physician discussed the case with neurology who did not recommend any long-term EEG or spot EEG due to known history and the fact that it will not change the management and they recommended continuing home medications.  Patient was observed and she has not had  any further episodes of seizure during this hospitalization and as mentioned above, daughter confirms that patient is back at her baseline and is requesting discharge.  Patient is already under hospice care.  She is being discharged back to the [facility] facility.  Discharge plan was discussed with patient and/or family member and they verbalized understanding and agreed with it.  Discharge Diagnoses:  Principal Problem:   Seizures ([place]) Active Problems:   Depression   Hypertension   Dementia (HCC)   PAF (paroxysmal atrial fibrillation) ([place])    Anemia    Discharge Instructions   Allergies as of 01/24/2023       Reactions   Cipro [ciprofloxacin Hcl] Other (See Comments)   Unknown reaction Documented on MAR   Effexor [venlafaxine] Other (See Comments)   Unknown reaction Documented on MAR   Gantrisin [sulfisoxazole] Other (See Comments)   Unknown reaction Documented on MAR   Levaquin [levofloxacin] Other (See Comments)   Unknown reaction Documented on MAR   Sulfa Antibiotics Rash        Medication List     TAKE these medications    acetaminophen 500 MG tablet Commonly known as: TYLENOL Take 1,000 mg by mouth every 8 (eight) hours.   calcitonin (sal[place]) 200 UNIT/ACT nasal spray Commonly known as: MIACALCIN/FORTICAL Place 1 spray into alternate nostrils daily.   cetirizine 10 MG tablet Commonly known as: ZYRTEC Take 10 mg by mouth daily.   cyanocobalamin 1000 MCG tablet Commonly known as: VITAMIN B12 Take 1,000 mcg by mouth daily.   Dermacloud Oint Apply 1 application  topically See admin instructions. Apply to buttocks after each incontinent episode.   Desitin 40 % Pste Generic drug: Zinc Oxide Apply 1 Application topically See admin instructions. Apply after each bowel movement or brief change as needed.   digoxin 0.125 MG tablet Commonly known as: LANOXIN Take 0.125 mg by mouth every other day.   divalproex 125 MG capsule Commonly known as: DEPAKOTE SPRINKLE Take 125 mg by mouth at bedtime.   donepezil 5 MG tablet Commonly known as: ARICEPT Take 5 mg by mouth at bedtime.   DULoxetine 30 MG capsule Commonly known as: CYMBALTA Take 30 mg by mouth daily.   ferrous sulfate 325 (65 FE) MG tablet Take 1 tablet (325 mg total) by mouth daily with breakfast. What changed: when to take this   HYDROcodone-acetaminophen 5-325 MG tablet Commonly known as: NORCO/VICODIN Take 1-2 tablets by mouth every 6 (six) hours as needed. What changed:  how much to take reasons to take this   hydrOXYzine  25 MG tablet Commonly known as: ATARAX Take 25 mg by mouth 3 (three) times daily.   latanoprost 0.005 % ophthalmic solution Commonly known as: XALATAN Place 1 drop into both eyes at bedtime.   levETIRAcetam 500 MG tablet Commonly known as: KEPPRA Take 500 mg by mouth 2 (two) times daily.   ondansetron 4 MG tablet Commonly known as: ZOFRAN Take 1 tablet (4 mg total) by mouth every 6 (six) hours as needed for nausea.   pantoprazole 40 MG tablet Commonly known as: PROTONIX Take 40 mg by mouth daily.   pregabalin 50 MG capsule Commonly known as: LYRICA Take 50 mg by mouth 2 (two) times daily.   saccharomyces boulardii 250 MG capsule Commonly known as: FLORASTOR Take 1 capsule (250 mg total) by mouth 2 (two) times daily.   timolol 0.5 % ophthalmic solution Commonly known as: TIMOPTIC Place 1 drop into both eyes daily.  Follow-up Information     Seward Carol, MD Follow up in 1 week(s).   Specialty: Internal Medicine Contact information: [address] [place] [address] [phone]                Allergies  Allergen Reactions   Cipro [Ciprofloxacin Hcl] Other (See Comments)    Unknown reaction Documented on MAR   Effexor [Venlafaxine] Other (See Comments)    Unknown reaction Documented on MAR   Gantrisin [Sulfisoxazole] Other (See Comments)    Unknown reaction Documented on MAR   Levaquin [Levofloxacin] Other (See Comments)    Unknown reaction Documented on MAR   Sulfa Antibiotics Rash    Consultations: Neurology   Procedures/Studies: CT HEAD WO CONTRAST  Result Date: 01/23/2023 CLINICAL DATA:  Altered mental status. EXAM: CT HEAD WITHOUT CONTRAST TECHNIQUE: Contiguous axial images were obtained from the base of the skull through the vertex without intravenous contrast. RADIATION DOSE REDUCTION: This exam was performed according to the departmental dose-optimization program which includes automated exposure control,  adjustment of the mA and/or kV according to patient size and/or use of iterative reconstruction technique. COMPARISON:  CT head dated January 28, 2021. FINDINGS: Brain: No evidence of acute infarction, hemorrhage, hydrocephalus, extra-axial collection or mass lesion/mass effect. Stable atrophy and chronic microvascular ischemic changes. 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 Vascular: Calcified atherosclerosis at the skull base. No hyperdense vessel. Skull: Normal. Negative for fracture or focal lesion. Sinuses/Orbits: No acute finding. Other: None. IMPRESSION: 1. No acute intracranial abnormality. Stable atrophy and chronic microvascular ischemic changes. Electronically Signed   By: Titus Dubin M.D.   On: 01/23/2023 15:32     Discharge Exam: Vitals:   01/24/23 0918 01/24/23 0931  BP: (!) 157/68   Pulse: 90   Resp: 19   Temp:  98.6 F (37 C)  SpO2: 100%    Vitals:   01/24/23 0907 01/24/23 0915 01/24/23 0918 01/24/23 0931  BP:   (!) 157/68   Pulse: 84 85 90   Resp:  15 19   Temp:    98.6 F (37 C)  TempSrc:    Axillary  SpO2:  100% 100%   Weight:      Height:        General: Pt is alert, awake, not in acute distress Cardiovascular: RRR, S1/S2 +, no rubs, no gallops Respiratory: CTA bilaterally, no wheezing, no rhonchi Abdominal: Soft, NT, ND, bowel sounds + Extremities: no edema, no cyanosis    The results of significant diagnostics from this hospitalization (including imaging, microbiology, ancillary and laboratory) are listed below for reference.     Microbiology: No results found for this or any previous visit (from the past 240 hour(s)).   Labs: BNP (last 3 results) No results for input(s): "BNP" in the last 8760 hours. Basic Metabolic Panel: Recent Labs  Lab 01/23/23 1432 01/23/23 1446 01/24/23 0531  NA 139 141 135  K 4.2 4.2 3.5  CL 102 104 101  CO2 25  --  22  GLUCOSE 125* 124* 115*  BUN 12 14 9   CREATININE 0.89 0.70 0.76  CALCIUM 8.8*   --  8.7*  MG 1.3*  --   --    Liver Function Tests: Recent Labs  Lab 01/23/23 1432 01/24/23 0531  AST 19 14*  ALT 13 10  ALKPHOS 72 69  BILITOT 0.7 0.7  PROT 6.9 6.6  ALBUMIN 2.7* 2.7*   No results for input(s): "LIPASE", "AMYLASE" in the last  168 hours. No results for input(s): "AMMONIA" in the last 168 hours. CBC: Recent Labs  Lab 01/23/23 1432 01/23/23 1446 01/24/23 0531  WBC 15.9*  --  11.2*  NEUTROABS 13.1*  --   --   HGB 11.6* 12.2 11.3*  HCT 37.2 36.0 36.6  MCV 94.9  --  94.3  PLT 276  --  270   Cardiac Enzymes: No results for input(s): "CKTOTAL", "CKMB", "CKMBINDEX", "TROPONINI" in the last 168 hours. BNP: Invalid input(s): "POCBNP" CBG: Recent Labs  Lab 01/23/23 1428  GLUCAP 120*   D-Dimer No results for input(s): "DDIMER" in the last 72 hours. Hgb A1c No results for input(s): "HGBA1C" in the last 72 hours. Lipid Profile No results for input(s): "CHOL", "HDL", "LDLCALC", "TRIG", "CHOLHDL", "LDLDIRECT" in the last 72 hours. Thyroid function studies No results for input(s): "TSH", "T4TOTAL", "T3FREE", "THYROIDAB" in the last 72 hours.  Invalid input(s): "FREET3" Anemia work up No results for input(s): "VITAMINB12", "FOLATE", "FERRITIN", "TIBC", "IRON", "RETICCTPCT" in the last 72 hours. Urinalysis    Component Value Date/Time   COLORURINE YELLOW 01/03/2021 1851   APPEARANCEUR HAZY (A) 01/03/2021 1851   LABSPEC 1.020 01/03/2021 1851   PHURINE 5.0 01/03/2021 1851   GLUCOSEU NEGATIVE 01/03/2021 1851   HGBUR NEGATIVE 01/03/2021 1851   BILIRUBINUR NEGATIVE 01/03/2021 1851   KETONESUR NEGATIVE 01/03/2021 1851   PROTEINUR 30 (A) 01/03/2021 1851   UROBILINOGEN 1.0 06/22/2011 1224   NITRITE NEGATIVE 01/03/2021 1851   LEUKOCYTESUR MODERATE (A) 01/03/2021 1851   Sepsis Labs Recent Labs  Lab 01/23/23 1432 01/24/23 0531  WBC 15.9* 11.2*   Microbiology No results found for this or any previous visit (from the past 240 hour(s)).   Time  coordinating discharge: Over 30 minutes  SIGNED:   Darliss Cheney, MD  [facility] 01/24/2023, 10:45 AM *Please note that this is a verbal dictation therefore any spelling or grammatical errors are due to the "[employer]" system interpretation. If 7PM-7AM, please contact night-coverage [email or web address]

## 2023-01-24 NOTE — Progress Notes (Signed)
[facility] Liaison Note  Met with patient and daughter, [name], at bedside. Patient resting comfortably, does not open eyes but does mumble when spoken to. Per daughter this is her baseline.  [name] shares that she would like her mother to return to the facility, if medical provider feels she is stable for discharge. She feels her mother would do better if in her familiar environment.  Epic chat with Dr. Doristine Bosworth to share request of daughter. Dr. Doristine Bosworth is in agreement with plan, though would like to watch into the afternoon to ensure stability.  [name] will continue to follow for discharge disposition.  Please call with any hospice related questions or concerns.  Thank you, Margaretmary Eddy, BSN, RN [facility] Liaison [phone]

## 2023-01-24 NOTE — ED Notes (Signed)
[facility] called to transport hospice patient unable to gve pick up time

## 2023-01-24 NOTE — ED Notes (Signed)
Daughter(Linda) aware of [facility] present for pt transfer back to facility via stretcer

## 2023-01-24 NOTE — ED Notes (Signed)
New IV placed. New linens, gown, and brief applied.

## 2023-01-24 NOTE — ED Notes (Signed)
Pt repositioned to right side of body in bed, tolerated well, daughter present at bedside.

## 2023-01-24 NOTE — ED Notes (Signed)
ED TO INPATIENT HANDOFF REPORT  ED Nurse Name and Phone #: Su Grand [phone]  S Name/Age/Gender Kendra Smith 89 y.o. female Room/Bed: 041C/041C  Code Status   Code Status: DNR  Home/SNF/Other Nursing Home Patient oriented to: self Is this baseline? Yes   Triage Complete: Triage complete  Chief Complaint Seizures ([place]) [R56.9]  Triage Note Pt is here from nursing home ,( [facility]) for seizure , pt seized for  approx 30 mins prior to ems arrival , pt received 5mg  Im versed and 5mg  Iv versed by ems , pt has hx of seizure , pt being assisted ventilations on arrival     Allergies Allergies  Allergen Reactions   Cipro [Ciprofloxacin Hcl] Other (See Comments)    Unknown reaction Documented on MAR   Effexor [Venlafaxine] Other (See Comments)    Unknown reaction Documented on MAR   Gantrisin [Sulfisoxazole] Other (See Comments)    Unknown reaction Documented on MAR   Levaquin [Levofloxacin] Other (See Comments)    Unknown reaction Documented on MAR   Sulfa Antibiotics Rash    Level of Care/Admitting Diagnosis ED Disposition     ED Disposition  Admit   Condition  --   [place]: [facility] [[id number]]  Level of Care: Progressive [102]  Admit to Progressive based on following criteria: NEUROLOGICAL AND NEUROSURGICAL complex patients with significant risk of instability, who do not meet ICU criteria, yet require close observation or frequent assessment (< / = every 2 - 4 hours) with medical / nursing intervention.  May place patient in observation at [facility] or [facility] if equivalent level of care is available:: No  Covid Evaluation: Asymptomatic - no recent exposure (last 10 days) testing not required  Diagnosis: Seizures [facility]) [id number]  Admitting Physician: Marcelyn Bruins [id number]  Attending Physician: Marcelyn Bruins [id number]          B Medical/Surgery History Past Medical History:  Diagnosis Date    Dementia ([place])    Depression    Glaucoma    Hyperlipidemia    Hypertension    Osteoporosis    Past Surgical History:  Procedure Laterality Date   ABDOMINAL HYSTERECTOMY     ANKLE ARTHROSCOPY     CHOLECYSTECTOMY     EYE SURGERY     RECTOCELE REPAIR       A IV Location/Drains/Wounds Patient Lines/Drains/Airways Status     Active Line/Drains/Airways     Name Placement date Placement time Site Days   Peripheral IV 01/24/23 20 G 1" Left;Posterior Hand 01/24/23  0530  Hand  less than 1   Pressure Injury 01/04/21 Buttocks Right;Left Stage 2 -  Partial thickness loss of dermis presenting as a shallow open injury with a red, pink wound bed without slough. 01/04/21  0308  -- 750   Pressure Injury 06/22/21 Buttocks Right Stage 2 -  Partial thickness loss of dermis presenting as a shallow open injury with a red, pink wound bed without slough. pink 06/22/21  0457  -- 581            Intake/Output Last 24 hours  Intake/Output Summary (Last 24 hours) at 01/24/2023 1036 Last data filed at 01/24/2023 0931 Gross per 24 hour  Intake 425.8 ml  Output --  Net 425.8 ml    Labs/Imaging Results for orders placed or performed during the hospital encounter of 01/23/23 (from the past 48 hour(s))  CBG monitoring, ED     Status: Abnormal   Collection Time: 01/23/23  2:28 PM  Result Value Ref Range   Glucose-Capillary 120 (H) 70 - 99 mg/dL    Comment: Glucose reference range applies only to samples taken after fasting for at least 8 hours.  Comprehensive metabolic panel     Status: Abnormal   Collection Time: 01/23/23  2:32 PM  Result Value Ref Range   Sodium 139 135 - 145 mmol/L   Potassium 4.2 3.5 - 5.1 mmol/L   Chloride 102 98 - 111 mmol/L   CO2 25 22 - 32 mmol/L   Glucose, Bld 125 (H) 70 - 99 mg/dL    Comment: Glucose reference range applies only to samples taken after fasting for at least 8 hours.   BUN 12 8 - 23 mg/dL   Creatinine, Ser 0.89 0.44 - 1.00 mg/dL   Calcium 8.8 (L) 8.9 -  10.3 mg/dL   Total Protein 6.9 6.5 - 8.1 g/dL   Albumin 2.7 (L) 3.5 - 5.0 g/dL   AST 19 15 - 41 U/L   ALT 13 0 - 44 U/L   Alkaline Phosphatase 72 38 - 126 U/L   Total Bilirubin 0.7 0.3 - 1.2 mg/dL   GFR, Estimated >60 >60 mL/min    Comment: (NOTE) Calculated using the CKD-EPI Creatinine Equation (2021)    Anion gap 12 5 - 15    Comment: Performed at [facility], [place] [address] [place], [place] [address]  CBC with Differential/Platelet     Status: Abnormal   Collection Time: 01/23/23  2:32 PM  Result Value Ref Range   WBC 15.9 (H) 4.0 - 10.5 K/uL   RBC 3.92 3.87 - 5.11 MIL/uL   Hemoglobin 11.6 (L) 12.0 - 15.0 g/dL   HCT 37.2 36.0 - 46.0 %   MCV 94.9 80.0 - 100.0 fL   MCH 29.6 26.0 - 34.0 pg   MCHC 31.2 30.0 - 36.0 g/dL   RDW 14.9 11.5 - 15.5 %   Platelets 276 150 - 400 K/uL   nRBC 0.0 0.0 - 0.2 %   Neutrophils Relative % 83 %   Neutro Abs 13.1 (H) 1.7 - 7.7 K/uL   Lymphocytes Relative 8 %   Lymphs Abs 1.3 0.7 - 4.0 K/uL   Monocytes Relative 6 %   Monocytes Absolute 1.0 0.1 - 1.0 K/uL   Eosinophils Relative 2 %   Eosinophils Absolute 0.3 0.0 - 0.5 K/uL   Basophils Relative 0 %   Basophils Absolute 0.1 0.0 - 0.1 K/uL   Immature Granulocytes 1 %   Abs Immature Granulocytes 0.14 (H) 0.00 - 0.07 K/uL    Comment: Performed at [facility] [address] [place], [place] [address]  Magnesium     Status: Abnormal   Collection Time: 01/23/23  2:32 PM  Result Value Ref Range   Magnesium 1.3 (L) 1.7 - 2.4 mg/dL    Comment: Performed at [facility] [address], [place], [place] [address]  Valproic acid level     Status: Abnormal   Collection Time: 01/23/23  2:32 PM  Result Value Ref Range   Valproic Acid Lvl <10 (L) 50.0 - 100.0 ug/mL    Comment: RESULT CONFIRMED BY MANUAL DILUTION Performed at [facility] [address], [place], [place] [address]   I-stat chem 8, ED     Status: Abnormal   Collection Time: 01/23/23  2:46 PM  Result Value Ref  Range   Sodium 141 135 - 145 mmol/L   Potassium 4.2  3.5 - 5.1 mmol/L   Chloride 104 98 - 111 mmol/L   BUN 14 8 - 23 mg/dL   Creatinine, Ser 0.70 0.44 - 1.00 mg/dL   Glucose, Bld 124 (H) 70 - 99 mg/dL    Comment: Glucose reference range applies only to samples taken after fasting for at least 8 hours.   Calcium, Ion 1.12 (L) 1.15 - 1.40 mmol/L   TCO2 27 22 - 32 mmol/L   Hemoglobin 12.2 12.0 - 15.0 g/dL   HCT 36.0 36.0 - 46.0 %  Comprehensive metabolic panel     Status: Abnormal   Collection Time: 01/24/23  5:31 AM  Result Value Ref Range   Sodium 135 135 - 145 mmol/L   Potassium 3.5 3.5 - 5.1 mmol/L   Chloride 101 98 - 111 mmol/L   CO2 22 22 - 32 mmol/L   Glucose, Bld 115 (H) 70 - 99 mg/dL    Comment: Glucose reference range applies only to samples taken after fasting for at least 8 hours.   BUN 9 8 - 23 mg/dL   Creatinine, Ser 0.76 0.44 - 1.00 mg/dL   Calcium 8.7 (L) 8.9 - 10.3 mg/dL   Total Protein 6.6 6.5 - 8.1 g/dL   Albumin 2.7 (L) 3.5 - 5.0 g/dL   AST 14 (L) 15 - 41 U/L   ALT 10 0 - 44 U/L   Alkaline Phosphatase 69 38 - 126 U/L   Total Bilirubin 0.7 0.3 - 1.2 mg/dL   GFR, Estimated >60 >60 mL/min    Comment: (NOTE) Calculated using the CKD-EPI Creatinine Equation (2021)    Anion gap 12 5 - 15    Comment: Performed at [facility], [place] [address], [place], [place] [address]  CBC     Status: Abnormal   Collection Time: 01/24/23  5:31 AM  Result Value Ref Range   WBC 11.2 (H) 4.0 - 10.5 K/uL   RBC 3.88 3.87 - 5.11 MIL/uL   Hemoglobin 11.3 (L) 12.0 - 15.0 g/dL   HCT 36.6 36.0 - 46.0 %   MCV 94.3 80.0 - 100.0 fL   MCH 29.1 26.0 - 34.0 pg   MCHC 30.9 30.0 - 36.0 g/dL   RDW 15.1 11.5 - 15.5 %   Platelets 270 150 - 400 K/uL   nRBC 0.0 0.0 - 0.2 %    Comment: Performed at [facility], [place] [address], [place], [place] [address]   CT HEAD WO CONTRAST  Result Date: 01/23/2023 CLINICAL DATA:  Altered mental status. EXAM: CT HEAD WITHOUT CONTRAST TECHNIQUE:  Contiguous axial images were obtained from the base of the skull through the vertex without intravenous contrast. RADIATION DOSE REDUCTION: This exam was performed according to the departmental dose-optimization program which includes automated exposure control, adjustment of the mA and/or kV according to patient size and/or use of iterative reconstruction technique. COMPARISON:  CT head dated January 28, 2021. FINDINGS: Brain: No evidence of acute infarction, hemorrhage, hydrocephalus, extra-axial collection or mass lesion/mass effect. Stable atrophy and chronic microvascular ischemic changes. 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 Vascular: Calcified atherosclerosis at the skull base. No hyperdense vessel. Skull: Normal. Negative for fracture or focal lesion. Sinuses/Orbits: No acute finding. Other: None. IMPRESSION: 1. No acute intracranial abnormality. Stable atrophy and chronic microvascular ischemic changes. Electronically Signed   By: Titus Dubin M.D.   On: 01/23/2023 15:32    Pending Labs [employer] (From admission, onward)     Start  Ordered   01/30/23 0500  Creatinine, serum  (enoxaparin (LOVENOX)    CrCl >/= 30 ml/min)  Weekly,   R     Comments: while on enoxaparin therapy    01/23/23 1815   01/23/23 1422  Keppra (Levetiracetam) level  Once,   URGENT        01/23/23 1422            Vitals/Pain Today's Vitals   01/24/23 0907 01/24/23 0915 01/24/23 0918 01/24/23 0931  BP:   (!) 157/68   Pulse: 84 85 90   Resp:  15 19   Temp:    98.6 F (37 C)  TempSrc:    Axillary  SpO2:  100% 100%   Weight:      Smith:      PainSc:        Isolation Precautions No active isolations  Medications Medications  pantoprazole (PROTONIX) EC tablet 40 mg (40 mg Oral Given 01/24/23 0908)  levETIRAcetam (KEPPRA) IVPB 500 mg/100 mL premix (0 mg Intravenous Stopped 01/24/23 0931)  pregabalin (LYRICA) capsule 50 mg (50 mg Oral Given 01/24/23 0908)  enoxaparin (LOVENOX)  injection 40 mg (40 mg Subcutaneous Given 01/24/23 0031)  chlorhexidine gluconate (MEDLINE KIT) (PERIDEX) 0.12 % solution 15 mL (15 mLs Mouth Rinse Not Given 01/24/23 0700)  Oral care mouth rinse (15 mLs Mouth Rinse Given 01/24/23 0925)  LORazepam (ATIVAN) injection 2 mg (has no administration in time range)  sodium chloride flush (NS) 0.9 % injection 3 mL (3 mLs Intravenous Given 01/24/23 0918)  acetaminophen (TYLENOL) tablet 650 mg (has no administration in time range)    Or  acetaminophen (TYLENOL) suppository 650 mg (has no administration in time range)  polyethylene glycol (MIRALAX / GLYCOLAX) packet 17 g (has no administration in time range)  HYDROcodone-acetaminophen (NORCO/VICODIN) 5-325 MG per tablet 1-2 tablet (has no administration in time range)  digoxin (LANOXIN) tablet 0.125 mg (0.125 mg Oral Given 01/24/23 0907)  donepezil (ARICEPT) tablet 5 mg (has no administration in time range)  DULoxetine (CYMBALTA) DR capsule 30 mg (30 mg Oral Given 01/24/23 0908)  hydrOXYzine (ATARAX) tablet 25 mg (25 mg Oral Given 01/24/23 0908)  ferrous sulfate tablet 325 mg (325 mg Oral Given 01/24/23 0908)  cyanocobalamin (VITAMIN B12) tablet 1,000 mcg (1,000 mcg Oral Given 01/24/23 0907)  divalproex (DEPAKOTE SPRINKLE) capsule 125 mg (has no administration in time range)  levETIRAcetam (KEPPRA) IVPB 1500 mg/ 100 mL premix (0 mg Intravenous Stopped 01/23/23 1505)    And  levETIRAcetam (KEPPRA) 2,500 mg in sodium chloride 0.9 % 250 mL IVPB (0 mg Intravenous Stopped 01/23/23 1554)  magnesium sulfate IVPB 2 g 50 mL (0 g Intravenous Stopped 01/23/23 1717)    Mobility non-ambulatory     Focused Assessments Neuro Assessment Handoff:  Swallow screen pass? Yes          Neuro Assessment: Exceptions to [facility] Neuro Checks:      Has TPA been given? No If patient is a Neuro Trauma and patient is going to OR before floor call report to [place] nurse: [phone] or [phone]   R Recommendations: See  Admitting Provider Note  Report given to:   Additional Notes:

## 2023-01-25 DIAGNOSIS — E43 Unspecified severe protein-calorie malnutrition: Secondary | ICD-10-CM | POA: Diagnosis not present

## 2023-01-25 DIAGNOSIS — F329 Major depressive disorder, single episode, unspecified: Secondary | ICD-10-CM | POA: Diagnosis not present

## 2023-01-25 DIAGNOSIS — M150 Primary generalized (osteo)arthritis: Secondary | ICD-10-CM | POA: Diagnosis not present

## 2023-01-25 DIAGNOSIS — Z6820 Body mass index (BMI) 20.0-20.9, adult: Secondary | ICD-10-CM | POA: Diagnosis not present

## 2023-01-25 DIAGNOSIS — G311 Senile degeneration of brain, not elsewhere classified: Secondary | ICD-10-CM | POA: Diagnosis not present

## 2023-01-25 DIAGNOSIS — R296 Repeated falls: Secondary | ICD-10-CM | POA: Diagnosis not present

## 2023-01-25 DIAGNOSIS — G4089 Other seizures: Secondary | ICD-10-CM | POA: Diagnosis not present

## 2023-01-25 DIAGNOSIS — K5909 Other constipation: Secondary | ICD-10-CM | POA: Diagnosis not present

## 2023-01-25 DIAGNOSIS — F0283 Dementia in other diseases classified elsewhere, unspecified severity, with mood disturbance: Secondary | ICD-10-CM | POA: Diagnosis not present

## 2023-01-25 DIAGNOSIS — F0280 Dementia in other diseases classified elsewhere without behavioral disturbance: Secondary | ICD-10-CM | POA: Diagnosis not present

## 2023-01-25 DIAGNOSIS — I480 Paroxysmal atrial fibrillation: Secondary | ICD-10-CM | POA: Diagnosis not present

## 2023-01-25 DIAGNOSIS — F0284 Dementia in other diseases classified elsewhere, unspecified severity, with anxiety: Secondary | ICD-10-CM | POA: Diagnosis not present

## 2023-01-25 DIAGNOSIS — K219 Gastro-esophageal reflux disease without esophagitis: Secondary | ICD-10-CM | POA: Diagnosis not present

## 2023-01-26 DIAGNOSIS — E43 Unspecified severe protein-calorie malnutrition: Secondary | ICD-10-CM | POA: Diagnosis not present

## 2023-01-26 DIAGNOSIS — F0280 Dementia in other diseases classified elsewhere without behavioral disturbance: Secondary | ICD-10-CM | POA: Diagnosis not present

## 2023-01-26 DIAGNOSIS — F0283 Dementia in other diseases classified elsewhere, unspecified severity, with mood disturbance: Secondary | ICD-10-CM | POA: Diagnosis not present

## 2023-01-26 DIAGNOSIS — Z6820 Body mass index (BMI) 20.0-20.9, adult: Secondary | ICD-10-CM | POA: Diagnosis not present

## 2023-01-26 DIAGNOSIS — G311 Senile degeneration of brain, not elsewhere classified: Secondary | ICD-10-CM | POA: Diagnosis not present

## 2023-01-26 DIAGNOSIS — I482 Chronic atrial fibrillation, unspecified: Secondary | ICD-10-CM | POA: Diagnosis not present

## 2023-01-26 DIAGNOSIS — R296 Repeated falls: Secondary | ICD-10-CM | POA: Diagnosis not present

## 2023-01-26 DIAGNOSIS — F0284 Dementia in other diseases classified elsewhere, unspecified severity, with anxiety: Secondary | ICD-10-CM | POA: Diagnosis not present

## 2023-01-26 DIAGNOSIS — I10 Essential (primary) hypertension: Secondary | ICD-10-CM | POA: Diagnosis not present

## 2023-01-26 DIAGNOSIS — G40901 Epilepsy, unspecified, not intractable, with status epilepticus: Secondary | ICD-10-CM | POA: Diagnosis not present

## 2023-01-26 LAB — LEVETIRACETAM LEVEL: Levetiracetam Lvl: 24.1 ug/mL (ref 10.0–40.0)

## 2023-01-27 DIAGNOSIS — F0283 Dementia in other diseases classified elsewhere, unspecified severity, with mood disturbance: Secondary | ICD-10-CM | POA: Diagnosis not present

## 2023-01-27 DIAGNOSIS — E43 Unspecified severe protein-calorie malnutrition: Secondary | ICD-10-CM | POA: Diagnosis not present

## 2023-01-27 DIAGNOSIS — G311 Senile degeneration of brain, not elsewhere classified: Secondary | ICD-10-CM | POA: Diagnosis not present

## 2023-01-27 DIAGNOSIS — R296 Repeated falls: Secondary | ICD-10-CM | POA: Diagnosis not present

## 2023-01-27 DIAGNOSIS — F0284 Dementia in other diseases classified elsewhere, unspecified severity, with anxiety: Secondary | ICD-10-CM | POA: Diagnosis not present

## 2023-01-27 DIAGNOSIS — Z6820 Body mass index (BMI) 20.0-20.9, adult: Secondary | ICD-10-CM | POA: Diagnosis not present

## 2023-01-28 ENCOUNTER — Emergency Department (HOSPITAL_COMMUNITY): Payer: Medicare Other

## 2023-01-28 ENCOUNTER — Encounter (HOSPITAL_COMMUNITY): Payer: Self-pay

## 2023-01-28 ENCOUNTER — Emergency Department (HOSPITAL_COMMUNITY)
Admission: EM | Admit: 2023-01-28 | Discharge: 2023-01-28 | Disposition: A | Discharge status: Skilled Nursing Facility | Payer: Medicare Other | Attending: Emergency Medicine | Admitting: Emergency Medicine

## 2023-01-28 ENCOUNTER — Other Ambulatory Visit: Payer: Self-pay

## 2023-01-28 DIAGNOSIS — S0990XA Unspecified injury of head, initial encounter: Secondary | ICD-10-CM

## 2023-01-28 DIAGNOSIS — G311 Senile degeneration of brain, not elsewhere classified: Secondary | ICD-10-CM | POA: Diagnosis not present

## 2023-01-28 DIAGNOSIS — G8929 Other chronic pain: Secondary | ICD-10-CM | POA: Insufficient documentation

## 2023-01-28 DIAGNOSIS — W19XXXA Unspecified fall, initial encounter: Secondary | ICD-10-CM

## 2023-01-28 DIAGNOSIS — W133XXA Fall through floor, initial encounter: Secondary | ICD-10-CM | POA: Diagnosis not present

## 2023-01-28 DIAGNOSIS — S0993XA Unspecified injury of face, initial encounter: Secondary | ICD-10-CM | POA: Diagnosis not present

## 2023-01-28 DIAGNOSIS — N390 Urinary tract infection, site not specified: Secondary | ICD-10-CM | POA: Insufficient documentation

## 2023-01-28 DIAGNOSIS — Z79899 Other long term (current) drug therapy: Secondary | ICD-10-CM | POA: Diagnosis not present

## 2023-01-28 DIAGNOSIS — Z6820 Body mass index (BMI) 20.0-20.9, adult: Secondary | ICD-10-CM | POA: Diagnosis not present

## 2023-01-28 DIAGNOSIS — N1831 Chronic kidney disease, stage 3a: Secondary | ICD-10-CM | POA: Insufficient documentation

## 2023-01-28 DIAGNOSIS — R296 Repeated falls: Secondary | ICD-10-CM | POA: Diagnosis not present

## 2023-01-28 DIAGNOSIS — M25512 Pain in left shoulder: Secondary | ICD-10-CM | POA: Diagnosis not present

## 2023-01-28 DIAGNOSIS — Z8616 Personal history of COVID-19: Secondary | ICD-10-CM | POA: Insufficient documentation

## 2023-01-28 DIAGNOSIS — S199XXA Unspecified injury of neck, initial encounter: Secondary | ICD-10-CM | POA: Diagnosis not present

## 2023-01-28 DIAGNOSIS — D72829 Elevated white blood cell count, unspecified: Secondary | ICD-10-CM | POA: Diagnosis not present

## 2023-01-28 DIAGNOSIS — F0390 Unspecified dementia without behavioral disturbance: Secondary | ICD-10-CM | POA: Insufficient documentation

## 2023-01-28 DIAGNOSIS — I129 Hypertensive chronic kidney disease with stage 1 through stage 4 chronic kidney disease, or unspecified chronic kidney disease: Secondary | ICD-10-CM | POA: Diagnosis not present

## 2023-01-28 DIAGNOSIS — R319 Hematuria, unspecified: Secondary | ICD-10-CM | POA: Insufficient documentation

## 2023-01-28 DIAGNOSIS — R0902 Hypoxemia: Secondary | ICD-10-CM | POA: Diagnosis not present

## 2023-01-28 DIAGNOSIS — S0083XA Contusion of other part of head, initial encounter: Secondary | ICD-10-CM | POA: Diagnosis not present

## 2023-01-28 DIAGNOSIS — F0284 Dementia in other diseases classified elsewhere, unspecified severity, with anxiety: Secondary | ICD-10-CM | POA: Diagnosis not present

## 2023-01-28 DIAGNOSIS — E43 Unspecified severe protein-calorie malnutrition: Secondary | ICD-10-CM | POA: Diagnosis not present

## 2023-01-28 DIAGNOSIS — R519 Headache, unspecified: Secondary | ICD-10-CM | POA: Diagnosis not present

## 2023-01-28 DIAGNOSIS — F0283 Dementia in other diseases classified elsewhere, unspecified severity, with mood disturbance: Secondary | ICD-10-CM | POA: Diagnosis not present

## 2023-01-28 LAB — CBC WITH DIFFERENTIAL/PLATELET
Abs Immature Granulocytes: 0.09 10*3/uL — ABNORMAL HIGH (ref 0.00–0.07)
Basophils Absolute: 0 10*3/uL (ref 0.0–0.1)
Basophils Relative: 0 %
Eosinophils Absolute: 0.5 10*3/uL (ref 0.0–0.5)
Eosinophils Relative: 4 %
HCT: 36.1 % (ref 36.0–46.0)
Hemoglobin: 11.1 g/dL — ABNORMAL LOW (ref 12.0–15.0)
Immature Granulocytes: 1 %
Lymphocytes Relative: 14 %
Lymphs Abs: 1.7 10*3/uL (ref 0.7–4.0)
MCH: 29.2 pg (ref 26.0–34.0)
MCHC: 30.7 g/dL (ref 30.0–36.0)
MCV: 95 fL (ref 80.0–100.0)
Monocytes Absolute: 0.8 10*3/uL (ref 0.1–1.0)
Monocytes Relative: 7 %
Neutro Abs: 9.1 10*3/uL — ABNORMAL HIGH (ref 1.7–7.7)
Neutrophils Relative %: 74 %
Platelets: 313 10*3/uL (ref 150–400)
RBC: 3.8 MIL/uL — ABNORMAL LOW (ref 3.87–5.11)
RDW: 15 % (ref 11.5–15.5)
WBC: 12.3 10*3/uL — ABNORMAL HIGH (ref 4.0–10.5)
nRBC: 0 % (ref 0.0–0.2)

## 2023-01-28 LAB — URINALYSIS, ROUTINE W REFLEX MICROSCOPIC
Bilirubin Urine: NEGATIVE
Glucose, UA: NEGATIVE mg/dL
Ketones, ur: NEGATIVE mg/dL
Nitrite: NEGATIVE
Protein, ur: 100 mg/dL — AB
RBC / HPF: >50 RBC/hpf (ref 0–5)
Specific Gravity, Urine: 1.021 (ref 1.005–1.030)
WBC, UA: >50 WBC/hpf (ref 0–5)
pH: 5 (ref 5.0–8.0)

## 2023-01-28 LAB — BASIC METABOLIC PANEL
Anion gap: 9 (ref 5–15)
BUN: 19 mg/dL (ref 8–23)
CO2: 26 mmol/L (ref 22–32)
Calcium: 8.8 mg/dL — ABNORMAL LOW (ref 8.9–10.3)
Chloride: 106 mmol/L (ref 98–111)
Creatinine, Ser: 0.85 mg/dL (ref 0.44–1.00)
GFR, Estimated: >60 mL/min (ref >60)
Glucose, Bld: 124 mg/dL — ABNORMAL HIGH (ref 70–99)
Potassium: 3.8 mmol/L (ref 3.5–5.1)
Sodium: 141 mmol/L (ref 135–145)

## 2023-01-28 LAB — VALPROIC ACID LEVEL: Valproic Acid Lvl: <10 ug/mL — ABNORMAL LOW (ref 50.0–100.0)

## 2023-01-28 MED ORDER — CEFTRIAXONE SODIUM 1 G IJ SOLR
500.0000 mg | Freq: Once | INTRAMUSCULAR | Status: AC
  Administered 2023-01-28: 500 mg via INTRAMUSCULAR
  Filled 2023-01-28: qty 10

## 2023-01-28 MED ORDER — CEPHALEXIN 500 MG PO CAPS: 500.0000 mg | ORAL_CAPSULE | Freq: Three times a day (TID) | ORAL | 0 refills | Status: AC

## 2023-01-28 MED ORDER — STERILE WATER FOR INJECTION IJ SOLN
INTRAMUSCULAR | Status: AC
  Administered 2023-01-28: 10 mL
  Filled 2023-01-28: qty 10

## 2023-01-28 NOTE — ED Provider Notes (Signed)
[facility] Provider Note  CSN: [id number] Arrival date & time: 01/28/23 [id number]  Chief Complaint(s) Fall  HPI Kendra Smith is a 89 y.o. female with past medical history as below, significant for dementia, depression, HLD, HTN, seizure who presents to the ED with complaint of fall. Pt with hx seizure with med compliance per family at bedside. Pt with reported fall at facility pta this am. Downtime around 10-20 minutes per nursing home. No seizure activity reported. Pt typically does not walk but she attempted to get out of bed this am and fell to the ground. Chronic pain to left shoulder at baseline, unchanged. Has headache and facial pain, dysuria, o/w no complaints . No seizure activity reported prior to arrival by snf or en route.   No thinners. Level 5 caveat dementia  Past Medical History Past Medical History:  Diagnosis Date   Dementia ([place])    Depression    Glaucoma    Hyperlipidemia    Hypertension    Osteoporosis    Patient Active Problem List   Diagnosis Date Noted   Seizures ([place]) 01/23/2023   Goals of care, counseling/discussion    Anemia 06/20/2021   Pressure injury of skin 01/04/2021   Acute renal failure superimposed on stage 3a chronic kidney disease ([place]) 01/03/2021   Severe sepsis ([facility]) 123XX123   Metabolic acidosis 123XX123   Acute metabolic encephalopathy 123XX123   Seizure ([place]) 05/28/2020   Weakness generalized    GI bleed 08/30/2019   UTI (urinary tract infection) 08/30/2019   Hypokalemia 08/30/2019   Demand ischemia 08/30/2019   Palliative care by specialist    COVID-19    DNR (do not resuscitate)    Adult failure to thrive    Left acetabular fracture ([place]) 08/23/2019   Pelvic rami fracture ([place]) 08/23/2019   PAF (paroxysmal atrial fibrillation) ([place]) 08/23/2019   Rib fracture-12 01/12/2019   Fracture of transverse process of lumbar vertebra L1-L2 01/12/2019   Dementia ([place]) 01/12/2019    Fall 01/12/2019   Leukocytosis 01/12/2019   Depression    Hypertension    Home Medication(s) Prior to Admission medications   Medication Sig Start Date End Date Taking? Authorizing Provider  cephALEXin (KEFLEX) 500 MG capsule Take 1 capsule (500 mg total) by mouth 3 (three) times daily for 7 days. 01/28/23 02/04/23 Yes Jeanell Sparrow, DO  acetaminophen (TYLENOL) 500 MG tablet Take 1,000 mg by mouth every 8 (eight) hours.    [provider]  calcitonin, salmon, (MIACALCIN/FORTICAL) 200 UNIT/ACT nasal spray Place 1 spray into alternate nostrils daily.    [provider]  cetirizine (ZYRTEC) 10 MG tablet Take 10 mg by mouth daily.    [provider]  cyanocobalamin (VITAMIN B12) 1000 MCG tablet Take 1,000 mcg by mouth daily.    [provider]  digoxin (LANOXIN) 0.125 MG tablet Take 0.125 mg by mouth every other day.     [provider]  divalproex (DEPAKOTE SPRINKLE) 125 MG capsule Take 125 mg by mouth at bedtime.    [provider]  donepezil (ARICEPT) 5 MG tablet Take 5 mg by mouth at bedtime. 10/15/18   [provider]  DULoxetine (CYMBALTA) 30 MG capsule Take 30 mg by mouth daily. 12/11/20   [provider]  ferrous sulfate 325 (65 FE) MG tablet Take 1 tablet (325 mg total) by mouth daily with breakfast. Patient taking differently: Take 325 mg by mouth daily. 06/22/21 03/21/23  Elodia Florence., MD  HYDROcodone-acetaminophen (NORCO/VICODIN) 5-325 MG tablet Take 1-2 tablets by mouth every 6 (six) hours as needed. 01/24/23   Darliss Cheney, MD  hydrOXYzine (ATARAX) 25 MG tablet Take 25 mg by mouth 3 (three) times daily.    [provider]  [employer] [facility]) OINT Apply 1 application  topically See admin instructions. Apply to buttocks after each incontinent episode.    [provider]  latanoprost (XALATAN) 0.005 % ophthalmic solution Place 1 drop into both eyes at bedtime.    [provider]  levETIRAcetam (KEPPRA) 500 MG tablet Take 500 mg by mouth 2 (two) times daily.    [provider]  ondansetron (ZOFRAN) 4 MG tablet Take 1 tablet (4 mg total) by mouth every 6 (six) hours as needed for nausea. 01/07/21   Raiford Noble Latif, DO  pantoprazole (PROTONIX) 40 MG tablet Take 40 mg by mouth daily.    [provider]  pregabalin (LYRICA) 50 MG capsule Take 50 mg by mouth 2 (two) times daily.    [provider]  saccharomyces boulardii (FLORASTOR) 250 MG capsule Take 1 capsule (250 mg total) by mouth 2 (two) times daily. 01/07/21   Raiford Noble Latif, DO  timolol (TIMOPTIC) 0.5 % ophthalmic solution Place 1 drop into both eyes daily. 06/15/14   [provider]  Zinc Oxide (DESITIN) 40 % PSTE Apply 1 Application topically See admin instructions. Apply after each bowel movement or brief change as needed.    [provider]                                                                                                                                    Past Surgical History Past Surgical History:  Procedure Laterality Date   ABDOMINAL HYSTERECTOMY     ANKLE ARTHROSCOPY     CHOLECYSTECTOMY     EYE SURGERY     RECTOCELE REPAIR     Family History History reviewed. No pertinent family history.  Social History Social History   Tobacco Use   Smoking status: Never   Smokeless tobacco: Never  Vaping Use   Vaping Use: Never used  Substance Use Topics   Alcohol use: No   Drug use: No   Allergies Cipro [ciprofloxacin hcl], Effexor [venlafaxine], Gantrisin [sulfisoxazole], Levaquin [levofloxacin], and Sulfa antibiotics  Review of Systems Review of Systems  Unable to perform ROS: Dementia  Constitutional:  Negative for chills and fever.  Respiratory:  Negative for cough and shortness of breath.   Cardiovascular:  Negative for chest pain.  Gastrointestinal:  Negative for nausea and vomiting.  Genitourinary:  Positive for  dysuria. Negative for hematuria.  Musculoskeletal:  Negative for neck pain.  Skin:  Positive for wound.  Neurological:  Positive for headaches.    Physical Exam Vital Signs  I have reviewed the triage vital signs BP (!) 174/80 (BP Location: Left Arm)   Pulse 90   Temp 97.6  F (36.4 C) (Oral)   Resp 18   Ht 5' (1.524 m)   Wt 68 kg   SpO2 95%   BMI 29.28 kg/m  Physical Exam Vitals and nursing note reviewed. Exam conducted with a chaperone present.  Constitutional:      General: She is not in acute distress.    Appearance: Normal appearance.  HENT:     Head: Normocephalic. No raccoon eyes or Battle's sign.     Jaw: There is normal jaw occlusion.     Comments: Hematoma forehead    Right Ear: External ear normal.     Left Ear: External ear normal.     Nose: Nose normal.     Mouth/Throat:     Mouth: Mucous membranes are moist.  Eyes:     General: No scleral icterus.       Right eye: No discharge.        Left eye: No discharge.     Extraocular Movements: Extraocular movements intact.     Pupils: Pupils are equal, round, and reactive to light.  Cardiovascular:     Rate and Rhythm: Normal rate and regular rhythm.     Pulses: Normal pulses.     Heart sounds: Normal heart sounds.  Pulmonary:     Effort: Pulmonary effort is normal. No respiratory distress.     Breath sounds: Normal breath sounds.  Abdominal:     General: Abdomen is flat.     Palpations: Abdomen is soft.     Tenderness: There is abdominal tenderness in the suprapubic area. There is no guarding or rebound.    Musculoskeletal:     Cervical back: No edema or rigidity.     Right lower leg: No edema.     Left lower leg: No edema.  Skin:    General: Skin is warm and dry.     Capillary Refill: Capillary refill takes less than 2 seconds.  Neurological:     Mental Status: She is alert. Mental status is at baseline.     GCS: GCS eye subscore is 4. GCS verbal subscore is 5. GCS motor subscore is 6.  Psychiatric:         Mood and Affect: Mood normal.        Behavior: Behavior normal.     ED Results and Treatments Labs (all labs ordered are listed, but only abnormal results are displayed) Labs Reviewed  CBC WITH DIFFERENTIAL/PLATELET - Abnormal; Notable for the following components:      Result Value   WBC 12.3 (*)    RBC 3.80 (*)    Hemoglobin 11.1 (*)    Neutro Abs 9.1 (*)    Abs Immature Granulocytes 0.09 (*)    All other components within normal limits  BASIC METABOLIC PANEL - Abnormal; Notable for the following components:   Glucose, Bld 124 (*)    Calcium 8.8 (*)    All other components within normal limits  URINALYSIS, ROUTINE W REFLEX MICROSCOPIC - Abnormal; Notable for the following components:   APPearance TURBID (*)    Hgb urine dipstick SMALL (*)    Protein, ur 100 (*)    Leukocytes,Ua MODERATE (*)    Bacteria, UA MANY (*)    All other components within normal limits  VALPROIC ACID LEVEL - Abnormal; Notable for the following components:   Valproic Acid Lvl <10 (*)    All other components within normal limits  URINE CULTURE  LEVETIRACETAM LEVEL  Radiology CT Head Wo Contrast  Result Date: 01/28/2023 CLINICAL DATA:  Head trauma, minor (Age >= 65y); Neck trauma (Age >= 65y); Facial trauma, blunt EXAM: CT HEAD WITHOUT CONTRAST CT MAXILLOFACIAL WITHOUT CONTRAST CT CERVICAL SPINE WITHOUT CONTRAST TECHNIQUE: Multidetector CT imaging of the head, cervical spine, and maxillofacial structures were performed using the standard protocol without intravenous contrast. Multiplanar CT image reconstructions of the cervical spine and maxillofacial structures were also generated. RADIATION DOSE REDUCTION: This exam was performed according to the departmental dose-optimization program which includes automated exposure control, adjustment of the mA and/or kV according to patient  size and/or use of iterative reconstruction technique. COMPARISON:  01/23/2023, 01/28/2021 FINDINGS: CT HEAD FINDINGS Brain: No evidence of acute infarction, hemorrhage, hydrocephalus, extra-axial collection or mass lesion/mass effect. Patchy low-density changes within the periventricular and subcortical white matter most compatible with chronic microvascular ischemic change. Mild-moderate diffuse cerebral volume loss. Vascular: Atherosclerotic calcifications involving the large vessels of the skull base. No unexpected hyperdense vessel. Skull: Normal. Negative for fracture or focal lesion. Other: Negative for scalp hematoma. CT MAXILLOFACIAL FINDINGS Technical note: Motion degraded exam. Osseous: No acute maxillofacial bone fracture. Bony orbital walls are intact. Mandible intact. Temporomandibular joints are aligned without dislocation. Degenerative changes of both temporomandibular joints. Orbits: Negative. No traumatic or inflammatory finding. Sinuses: Clear. Soft tissues: Negative. CT CERVICAL SPINE FINDINGS Alignment: Facet joints are aligned without dislocation or traumatic listhesis. Dens and lateral masses are aligned. Skull base and vertebrae: No acute fracture. No primary bone lesion or focal pathologic process. Soft tissues and spinal canal: No prevertebral fluid or swelling. No visible canal hematoma. Disc levels: Facet predominant cervical spondylosis, overall mild to moderate. No significant progression from prior. Upper chest: No acute abnormality. Other: Aortic and bilateral carotid atherosclerosis. IMPRESSION: 1. No acute intracranial abnormality. 2. No acute maxillofacial bone fracture. 3. No acute fracture or subluxation of the cervical spine. Aortic Atherosclerosis (ICD10-I70.0). Electronically Signed   By: Davina Poke D.O.   On: 01/28/2023 11:07   CT Maxillofacial Wo Contrast  Result Date: 01/28/2023 CLINICAL DATA:  Head trauma, minor (Age >= 65y); Neck trauma (Age >= 65y); Facial  trauma, blunt EXAM: CT HEAD WITHOUT CONTRAST CT MAXILLOFACIAL WITHOUT CONTRAST CT CERVICAL SPINE WITHOUT CONTRAST TECHNIQUE: Multidetector CT imaging of the head, cervical spine, and maxillofacial structures were performed using the standard protocol without intravenous contrast. Multiplanar CT image reconstructions of the cervical spine and maxillofacial structures were also generated. RADIATION DOSE REDUCTION: This exam was performed according to the departmental dose-optimization program which includes automated exposure control, adjustment of the mA and/or kV according to patient size and/or use of iterative reconstruction technique. COMPARISON:  01/23/2023, 01/28/2021 FINDINGS: CT HEAD FINDINGS Brain: No evidence of acute infarction, hemorrhage, hydrocephalus, extra-axial collection or mass lesion/mass effect. Patchy low-density changes within the periventricular and subcortical white matter most compatible with chronic microvascular ischemic change. Mild-moderate diffuse cerebral volume loss. Vascular: Atherosclerotic calcifications involving the large vessels of the skull base. No unexpected hyperdense vessel. Skull: Normal. Negative for fracture or focal lesion. Other: Negative for scalp hematoma. CT MAXILLOFACIAL FINDINGS Technical note: Motion degraded exam. Osseous: No acute maxillofacial bone fracture. Bony orbital walls are intact. Mandible intact. Temporomandibular joints are aligned without dislocation. Degenerative changes of both temporomandibular joints. Orbits: Negative. No traumatic or inflammatory finding. Sinuses: Clear. Soft tissues: Negative. CT CERVICAL SPINE FINDINGS Alignment: Facet joints are aligned without dislocation or traumatic listhesis. Dens and lateral masses are aligned. Skull base and vertebrae: No acute fracture. No primary bone lesion  or focal pathologic process. Soft tissues and spinal canal: No prevertebral fluid or swelling. No visible canal hematoma. Disc levels: Facet  predominant cervical spondylosis, overall mild to moderate. No significant progression from prior. Upper chest: No acute abnormality. Other: Aortic and bilateral carotid atherosclerosis. IMPRESSION: 1. No acute intracranial abnormality. 2. No acute maxillofacial bone fracture. 3. No acute fracture or subluxation of the cervical spine. Aortic Atherosclerosis (ICD10-I70.0). Electronically Signed   By: Davina Poke D.O.   On: 01/28/2023 11:07   CT Cervical Spine Wo Contrast  Result Date: 01/28/2023 CLINICAL DATA:  Head trauma, minor (Age >= 65y); Neck trauma (Age >= 65y); Facial trauma, blunt EXAM: CT HEAD WITHOUT CONTRAST CT MAXILLOFACIAL WITHOUT CONTRAST CT CERVICAL SPINE WITHOUT CONTRAST TECHNIQUE: Multidetector CT imaging of the head, cervical spine, and maxillofacial structures were performed using the standard protocol without intravenous contrast. Multiplanar CT image reconstructions of the cervical spine and maxillofacial structures were also generated. RADIATION DOSE REDUCTION: This exam was performed according to the departmental dose-optimization program which includes automated exposure control, adjustment of the mA and/or kV according to patient size and/or use of iterative reconstruction technique. COMPARISON:  01/23/2023, 01/28/2021 FINDINGS: CT HEAD FINDINGS Brain: No evidence of acute infarction, hemorrhage, hydrocephalus, extra-axial collection or mass lesion/mass effect. Patchy low-density changes within the periventricular and subcortical white matter most compatible with chronic microvascular ischemic change. Mild-moderate diffuse cerebral volume loss. Vascular: Atherosclerotic calcifications involving the large vessels of the skull base. No unexpected hyperdense vessel. Skull: Normal. Negative for fracture or focal lesion. Other: Negative for scalp hematoma. CT MAXILLOFACIAL FINDINGS Technical note: Motion degraded exam. Osseous: No acute maxillofacial bone fracture. Bony orbital walls are  intact. Mandible intact. Temporomandibular joints are aligned without dislocation. Degenerative changes of both temporomandibular joints. Orbits: Negative. No traumatic or inflammatory finding. Sinuses: Clear. Soft tissues: Negative. CT CERVICAL SPINE FINDINGS Alignment: Facet joints are aligned without dislocation or traumatic listhesis. Dens and lateral masses are aligned. Skull base and vertebrae: No acute fracture. No primary bone lesion or focal pathologic process. Soft tissues and spinal canal: No prevertebral fluid or swelling. No visible canal hematoma. Disc levels: Facet predominant cervical spondylosis, overall mild to moderate. No significant progression from prior. Upper chest: No acute abnormality. Other: Aortic and bilateral carotid atherosclerosis. IMPRESSION: 1. No acute intracranial abnormality. 2. No acute maxillofacial bone fracture. 3. No acute fracture or subluxation of the cervical spine. Aortic Atherosclerosis (ICD10-I70.0). Electronically Signed   By: Davina Poke D.O.   On: 01/28/2023 11:07    Pertinent labs & imaging results that were available during my care of the patient were reviewed by me and considered in my medical decision making (see MDM for details).  Medications Ordered in ED Medications  cefTRIAXone (ROCEPHIN) injection 500 mg (has no administration in time range)  Procedures Procedures  (including critical care time)  Medical Decision Making / ED Course    Medical Decision Making:    Kendra Smith is a 89 y.o. female ith past medical history as below, significant for dementia, depression, HLD, HTN, seizure who presents to the ED with complaint of fall.. The complaint involves an extensive differential diagnosis and also carries with it a high risk of complications and morbidity.  Serious etiology was considered. Ddx  includes but is not limited to: Differential diagnoses for head trauma includes subdural hematoma, epidural hematoma, acute concussion, traumatic subarachnoid hemorrhage, cerebral contusions, etc.   Complete initial physical exam performed, notably the patient  was NAD, hematoma to forehead noted, moving extremities spontaneously, more talkative than baseline per family but o/w wnl.    Reviewed and confirmed nursing documentation for past medical history, family history, social history.  Vital signs reviewed.    Clinical Course as of 01/28/23 1536  Wed Jan 28, 2023  1515 UTI noted, culture sent, afebrile. Given 1x dose rocephin while in the ED. Start keflex. Similar to baseline per family.  [SG]    Clinical Course User Index [SG] Jeanell Sparrow, DO   Labs reviewed. WBC 12.3 and UTI, WBC + likely 2/2 uti. Not septic. CTH/Face/c-spine w/o acute fx D/w family at bedside, pt is from [facility] and has hx dementia. She is somewhat more confused than her baseline. Similar to prior UTI. No seizure activity while in ED. Compliant with home seizure meds per family. Admission offered given uti/ams but family prefers to have patient back at facility, feel her behavior/ams will worsen while in ED> she is not septic. Will give rocephin IM prior to dc. Reviewed cultures, start keflex. Urine culture sent. Have her f/u with pcp.   Given concussion precautions  The patient improved significantly and was discharged in stable condition. Detailed discussions were had with the patient regarding current findings, and need for close f/u with PCP or on call doctor. The patient has been instructed to return immediately if the symptoms worsen in any way for re-evaluation. Patient verbalized understanding and is in agreement with current care plan. All questions answered prior to discharge.    Additional history obtained: -Additional history obtained from family -External records from outside source obtained and  reviewed including: Chart review including previous notes, labs, imaging, consultation notes including prior admission, home meds, prior labs/imaging  Admitted 3/15 with seizure/status epilepticus. DNR,    Lab Tests: -I ordered, reviewed, and interpreted labs.   The pertinent results include:   Labs Reviewed  CBC WITH DIFFERENTIAL/PLATELET - Abnormal; Notable for the following components:      Result Value   WBC 12.3 (*)    RBC 3.80 (*)    Hemoglobin 11.1 (*)    Neutro Abs 9.1 (*)    Abs Immature Granulocytes 0.09 (*)    All other components within normal limits  BASIC METABOLIC PANEL - Abnormal; Notable for the following components:   Glucose, Bld 124 (*)    Calcium 8.8 (*)    All other components within normal limits  URINALYSIS, ROUTINE W REFLEX MICROSCOPIC - Abnormal; Notable for the following components:   APPearance TURBID (*)    Hgb urine dipstick SMALL (*)    Protein, ur 100 (*)    Leukocytes,Ua MODERATE (*)    Bacteria, UA MANY (*)    All other components within normal limits  VALPROIC ACID LEVEL - Abnormal; Notable for the following components:   Valproic Acid Lvl <  10 (*)    All other components within normal limits  URINE CULTURE  LEVETIRACETAM LEVEL    Notable for leukocytosis, depakote level low  EKG   EKG Interpretation  Date/Time:    Ventricular Rate:    PR Interval:    QRS Duration:   QT Interval:    QTC Calculation:   R Axis:     Text Interpretation:           Imaging Studies ordered: I ordered imaging studies including CTH/face/c-spine I independently visualized the following imaging with scope of interpretation limited to determining acute life threatening conditions related to emergency care; findings noted above, significant for stable I independently visualized and interpreted imaging. I agree with the radiologist interpretation   Medicines ordered and prescription drug management: Meds ordered this encounter  Medications    cephALEXin (KEFLEX) 500 MG capsule    Sig: Take 1 capsule (500 mg total) by mouth 3 (three) times daily for 7 days.    Dispense:  21 capsule    Refill:  0   cefTRIAXone (ROCEPHIN) injection 500 mg    Order Specific Question:   Antibiotic Indication:    Answer:   UTI    -I have reviewed the patients home medicines and have made adjustments as needed   Consultations Obtained: na   Cardiac Monitoring: na  Social Determinants of Health:  Diagnosis or treatment significantly limited by social determinants of health: dementia/snf   Reevaluation: After the interventions noted above, I reevaluated the patient and found that they have improved  Co morbidities that complicate the patient evaluation  Past Medical History:  Diagnosis Date   Dementia ([place])    Depression    Glaucoma    Hyperlipidemia    Hypertension    Osteoporosis       Dispostion: Disposition decision including need for hospitalization was considered, and patient discharged from emergency department.    Final Clinical Impression(s) / ED Diagnoses Final diagnoses:  Urinary tract infection with hematuria, site unspecified  Fall, initial encounter  Minor head injury, initial encounter     This chart was dictated using voice recognition software.  Despite best efforts to proofread,  errors can occur which can change the documentation meaning.    Jeanell Sparrow, DO 01/28/23 1536

## 2023-01-28 NOTE — ED Triage Notes (Addendum)
Patient BIB [facility] from [facility]. Staff found patient laying on floor. Patient complaining of head pain. Hematoma to right side of head. No blood thinners. Has dementia.

## 2023-01-28 NOTE — Discharge Instructions (Signed)
It was a pleasure caring for you today in the emergency department. ? ?Please return to the emergency department for any worsening or worrisome symptoms. ? ?Based on the events which brought you to the ER today, it is possible that you may have a concussion. A concussion occurs when there is a blow to the head or body, with enough force to shake the brain and disrupt how the brain functions. You may experience symptoms such as headaches, sensitivity to light/noise, dizziness, cognitive slowing, difficulty concentrating / remembering, trouble sleeping and drowsiness. These symptoms may last anywhere from hours/days to potentially weeks/months. While these symptoms are very frustrating and perhaps debilitating, it is important that you remember that they will improve over time. Everyone has a different rate of recovery; it is difficult to predict when your symptoms will resolve. In order to allow for your brain to heal after the injury, we recommend that you see your primary physician or a physician knowledgeable in concussion management. We also advise you to let your body and brain rest: avoid physical activities (sports, gym, and exercise) and reduce cognitive demands (reading, texting, TV watching, computer use, video games, etc). School attendance, after-school activities and work may need to be modified to avoid increasing symptoms. We recommend against driving until until all symptoms have resolved. Come back to the ER right away if you are having repeated episodes of vomiting, severe/worsening headache/dizziness or any other symptom that alarms you. We recommended that someone stay with you for the next 24 hours to monitor for these worrisome symptoms. ?

## 2023-01-29 DIAGNOSIS — R296 Repeated falls: Secondary | ICD-10-CM | POA: Diagnosis not present

## 2023-01-29 DIAGNOSIS — G311 Senile degeneration of brain, not elsewhere classified: Secondary | ICD-10-CM | POA: Diagnosis not present

## 2023-01-29 DIAGNOSIS — E43 Unspecified severe protein-calorie malnutrition: Secondary | ICD-10-CM | POA: Diagnosis not present

## 2023-01-29 DIAGNOSIS — Z6820 Body mass index (BMI) 20.0-20.9, adult: Secondary | ICD-10-CM | POA: Diagnosis not present

## 2023-01-29 DIAGNOSIS — N390 Urinary tract infection, site not specified: Secondary | ICD-10-CM | POA: Diagnosis not present

## 2023-01-29 DIAGNOSIS — F0283 Dementia in other diseases classified elsewhere, unspecified severity, with mood disturbance: Secondary | ICD-10-CM | POA: Diagnosis not present

## 2023-01-29 DIAGNOSIS — G40901 Epilepsy, unspecified, not intractable, with status epilepticus: Secondary | ICD-10-CM | POA: Diagnosis not present

## 2023-01-29 DIAGNOSIS — W19XXXD Unspecified fall, subsequent encounter: Secondary | ICD-10-CM | POA: Diagnosis not present

## 2023-01-29 DIAGNOSIS — F0284 Dementia in other diseases classified elsewhere, unspecified severity, with anxiety: Secondary | ICD-10-CM | POA: Diagnosis not present

## 2023-01-29 DIAGNOSIS — F0280 Dementia in other diseases classified elsewhere without behavioral disturbance: Secondary | ICD-10-CM | POA: Diagnosis not present

## 2023-01-29 DIAGNOSIS — I10 Essential (primary) hypertension: Secondary | ICD-10-CM | POA: Diagnosis not present

## 2023-01-29 DIAGNOSIS — I482 Chronic atrial fibrillation, unspecified: Secondary | ICD-10-CM | POA: Diagnosis not present

## 2023-01-29 LAB — URINE CULTURE: Culture: <10000 — AB

## 2023-01-29 LAB — LEVETIRACETAM LEVEL: Levetiracetam Lvl: 21.9 ug/mL (ref 10.0–40.0)

## 2023-01-30 DIAGNOSIS — G40909 Epilepsy, unspecified, not intractable, without status epilepticus: Secondary | ICD-10-CM | POA: Diagnosis not present

## 2023-01-30 DIAGNOSIS — F0283 Dementia in other diseases classified elsewhere, unspecified severity, with mood disturbance: Secondary | ICD-10-CM | POA: Diagnosis not present

## 2023-01-30 DIAGNOSIS — Z6820 Body mass index (BMI) 20.0-20.9, adult: Secondary | ICD-10-CM | POA: Diagnosis not present

## 2023-01-30 DIAGNOSIS — F0390 Unspecified dementia without behavioral disturbance: Secondary | ICD-10-CM | POA: Diagnosis not present

## 2023-01-30 DIAGNOSIS — R296 Repeated falls: Secondary | ICD-10-CM | POA: Diagnosis not present

## 2023-01-30 DIAGNOSIS — G9340 Encephalopathy, unspecified: Secondary | ICD-10-CM | POA: Diagnosis not present

## 2023-01-30 DIAGNOSIS — F0284 Dementia in other diseases classified elsewhere, unspecified severity, with anxiety: Secondary | ICD-10-CM | POA: Diagnosis not present

## 2023-01-30 DIAGNOSIS — D649 Anemia, unspecified: Secondary | ICD-10-CM | POA: Diagnosis not present

## 2023-01-30 DIAGNOSIS — E43 Unspecified severe protein-calorie malnutrition: Secondary | ICD-10-CM | POA: Diagnosis not present

## 2023-01-30 DIAGNOSIS — G311 Senile degeneration of brain, not elsewhere classified: Secondary | ICD-10-CM | POA: Diagnosis not present

## 2023-02-01 DIAGNOSIS — R251 Tremor, unspecified: Secondary | ICD-10-CM | POA: Diagnosis not present

## 2023-02-01 DIAGNOSIS — I10 Essential (primary) hypertension: Secondary | ICD-10-CM | POA: Diagnosis not present

## 2023-02-01 DIAGNOSIS — F329 Major depressive disorder, single episode, unspecified: Secondary | ICD-10-CM | POA: Diagnosis not present

## 2023-02-01 DIAGNOSIS — K5909 Other constipation: Secondary | ICD-10-CM | POA: Diagnosis not present

## 2023-02-01 DIAGNOSIS — I480 Paroxysmal atrial fibrillation: Secondary | ICD-10-CM | POA: Diagnosis not present

## 2023-02-01 DIAGNOSIS — K219 Gastro-esophageal reflux disease without esophagitis: Secondary | ICD-10-CM | POA: Diagnosis not present

## 2023-02-01 DIAGNOSIS — G4089 Other seizures: Secondary | ICD-10-CM | POA: Diagnosis not present

## 2023-02-01 DIAGNOSIS — F0280 Dementia in other diseases classified elsewhere without behavioral disturbance: Secondary | ICD-10-CM | POA: Diagnosis not present

## 2023-02-01 DIAGNOSIS — M150 Primary generalized (osteo)arthritis: Secondary | ICD-10-CM | POA: Diagnosis not present

## 2023-02-02 DIAGNOSIS — G311 Senile degeneration of brain, not elsewhere classified: Secondary | ICD-10-CM | POA: Diagnosis not present

## 2023-02-02 DIAGNOSIS — F0283 Dementia in other diseases classified elsewhere, unspecified severity, with mood disturbance: Secondary | ICD-10-CM | POA: Diagnosis not present

## 2023-02-02 DIAGNOSIS — Z6820 Body mass index (BMI) 20.0-20.9, adult: Secondary | ICD-10-CM | POA: Diagnosis not present

## 2023-02-02 DIAGNOSIS — F0284 Dementia in other diseases classified elsewhere, unspecified severity, with anxiety: Secondary | ICD-10-CM | POA: Diagnosis not present

## 2023-02-02 DIAGNOSIS — R296 Repeated falls: Secondary | ICD-10-CM | POA: Diagnosis not present

## 2023-02-02 DIAGNOSIS — E43 Unspecified severe protein-calorie malnutrition: Secondary | ICD-10-CM | POA: Diagnosis not present

## 2023-02-03 ENCOUNTER — Telehealth: Payer: Self-pay

## 2023-02-03 DIAGNOSIS — G311 Senile degeneration of brain, not elsewhere classified: Secondary | ICD-10-CM | POA: Diagnosis not present

## 2023-02-03 DIAGNOSIS — F0283 Dementia in other diseases classified elsewhere, unspecified severity, with mood disturbance: Secondary | ICD-10-CM | POA: Diagnosis not present

## 2023-02-03 DIAGNOSIS — F0284 Dementia in other diseases classified elsewhere, unspecified severity, with anxiety: Secondary | ICD-10-CM | POA: Diagnosis not present

## 2023-02-03 DIAGNOSIS — E43 Unspecified severe protein-calorie malnutrition: Secondary | ICD-10-CM | POA: Diagnosis not present

## 2023-02-03 DIAGNOSIS — Z6820 Body mass index (BMI) 20.0-20.9, adult: Secondary | ICD-10-CM | POA: Diagnosis not present

## 2023-02-03 DIAGNOSIS — R296 Repeated falls: Secondary | ICD-10-CM | POA: Diagnosis not present

## 2023-02-03 NOTE — Telephone Encounter (Signed)
     Patient  visit on 3/20  at [facility] you been able to follow up with your primary care physician? Yes   The patient was or was not able to obtain any needed medicine or equipment. Yes   Are there diet recommendations that you are having difficulty following? Na   Patient expresses understanding of discharge instructions and education provided has no other needs at this time.  Yes      [facility] [phone] [address], [place], [place] [address] Phone: [phone] Email: [email or web address]

## 2023-02-04 DIAGNOSIS — G311 Senile degeneration of brain, not elsewhere classified: Secondary | ICD-10-CM | POA: Diagnosis not present

## 2023-02-04 DIAGNOSIS — Z6820 Body mass index (BMI) 20.0-20.9, adult: Secondary | ICD-10-CM | POA: Diagnosis not present

## 2023-02-04 DIAGNOSIS — F0283 Dementia in other diseases classified elsewhere, unspecified severity, with mood disturbance: Secondary | ICD-10-CM | POA: Diagnosis not present

## 2023-02-04 DIAGNOSIS — F0284 Dementia in other diseases classified elsewhere, unspecified severity, with anxiety: Secondary | ICD-10-CM | POA: Diagnosis not present

## 2023-02-04 DIAGNOSIS — E43 Unspecified severe protein-calorie malnutrition: Secondary | ICD-10-CM | POA: Diagnosis not present

## 2023-02-04 DIAGNOSIS — R296 Repeated falls: Secondary | ICD-10-CM | POA: Diagnosis not present

## 2023-02-06 DIAGNOSIS — R296 Repeated falls: Secondary | ICD-10-CM | POA: Diagnosis not present

## 2023-02-06 DIAGNOSIS — F0283 Dementia in other diseases classified elsewhere, unspecified severity, with mood disturbance: Secondary | ICD-10-CM | POA: Diagnosis not present

## 2023-02-06 DIAGNOSIS — E43 Unspecified severe protein-calorie malnutrition: Secondary | ICD-10-CM | POA: Diagnosis not present

## 2023-02-06 DIAGNOSIS — F0284 Dementia in other diseases classified elsewhere, unspecified severity, with anxiety: Secondary | ICD-10-CM | POA: Diagnosis not present

## 2023-02-06 DIAGNOSIS — Z6820 Body mass index (BMI) 20.0-20.9, adult: Secondary | ICD-10-CM | POA: Diagnosis not present

## 2023-02-06 DIAGNOSIS — G311 Senile degeneration of brain, not elsewhere classified: Secondary | ICD-10-CM | POA: Diagnosis not present

## 2023-02-09 DIAGNOSIS — E785 Hyperlipidemia, unspecified: Secondary | ICD-10-CM | POA: Diagnosis not present

## 2023-02-09 DIAGNOSIS — I129 Hypertensive chronic kidney disease with stage 1 through stage 4 chronic kidney disease, or unspecified chronic kidney disease: Secondary | ICD-10-CM | POA: Diagnosis not present

## 2023-02-09 DIAGNOSIS — L299 Pruritus, unspecified: Secondary | ICD-10-CM | POA: Diagnosis not present

## 2023-02-09 DIAGNOSIS — H409 Unspecified glaucoma: Secondary | ICD-10-CM | POA: Diagnosis not present

## 2023-02-09 DIAGNOSIS — G40909 Epilepsy, unspecified, not intractable, without status epilepticus: Secondary | ICD-10-CM | POA: Diagnosis not present

## 2023-02-09 DIAGNOSIS — E538 Deficiency of other specified B group vitamins: Secondary | ICD-10-CM | POA: Diagnosis not present

## 2023-02-09 DIAGNOSIS — R32 Unspecified urinary incontinence: Secondary | ICD-10-CM | POA: Diagnosis not present

## 2023-02-09 DIAGNOSIS — R296 Repeated falls: Secondary | ICD-10-CM | POA: Diagnosis not present

## 2023-02-09 DIAGNOSIS — I4891 Unspecified atrial fibrillation: Secondary | ICD-10-CM | POA: Diagnosis not present

## 2023-02-09 DIAGNOSIS — R159 Full incontinence of feces: Secondary | ICD-10-CM | POA: Diagnosis not present

## 2023-02-09 DIAGNOSIS — Z741 Need for assistance with personal care: Secondary | ICD-10-CM | POA: Diagnosis not present

## 2023-02-09 DIAGNOSIS — F0283 Dementia in other diseases classified elsewhere, unspecified severity, with mood disturbance: Secondary | ICD-10-CM | POA: Diagnosis not present

## 2023-02-09 DIAGNOSIS — Z6820 Body mass index (BMI) 20.0-20.9, adult: Secondary | ICD-10-CM | POA: Diagnosis not present

## 2023-02-09 DIAGNOSIS — K219 Gastro-esophageal reflux disease without esophagitis: Secondary | ICD-10-CM | POA: Diagnosis not present

## 2023-02-09 DIAGNOSIS — Z87440 Personal history of urinary (tract) infections: Secondary | ICD-10-CM | POA: Diagnosis not present

## 2023-02-09 DIAGNOSIS — D631 Anemia in chronic kidney disease: Secondary | ICD-10-CM | POA: Diagnosis not present

## 2023-02-09 DIAGNOSIS — G311 Senile degeneration of brain, not elsewhere classified: Secondary | ICD-10-CM | POA: Diagnosis not present

## 2023-02-09 DIAGNOSIS — M810 Age-related osteoporosis without current pathological fracture: Secondary | ICD-10-CM | POA: Diagnosis not present

## 2023-02-09 DIAGNOSIS — N1831 Chronic kidney disease, stage 3a: Secondary | ICD-10-CM | POA: Diagnosis not present

## 2023-02-09 DIAGNOSIS — Z8719 Personal history of other diseases of the digestive system: Secondary | ICD-10-CM | POA: Diagnosis not present

## 2023-02-09 DIAGNOSIS — F0284 Dementia in other diseases classified elsewhere, unspecified severity, with anxiety: Secondary | ICD-10-CM | POA: Diagnosis not present

## 2023-02-09 DIAGNOSIS — E43 Unspecified severe protein-calorie malnutrition: Secondary | ICD-10-CM | POA: Diagnosis not present

## 2023-02-11 DIAGNOSIS — R296 Repeated falls: Secondary | ICD-10-CM | POA: Diagnosis not present

## 2023-02-11 DIAGNOSIS — G311 Senile degeneration of brain, not elsewhere classified: Secondary | ICD-10-CM | POA: Diagnosis not present

## 2023-02-11 DIAGNOSIS — E43 Unspecified severe protein-calorie malnutrition: Secondary | ICD-10-CM | POA: Diagnosis not present

## 2023-02-11 DIAGNOSIS — F0283 Dementia in other diseases classified elsewhere, unspecified severity, with mood disturbance: Secondary | ICD-10-CM | POA: Diagnosis not present

## 2023-02-11 DIAGNOSIS — F0284 Dementia in other diseases classified elsewhere, unspecified severity, with anxiety: Secondary | ICD-10-CM | POA: Diagnosis not present

## 2023-02-11 DIAGNOSIS — Z6820 Body mass index (BMI) 20.0-20.9, adult: Secondary | ICD-10-CM | POA: Diagnosis not present

## 2023-02-13 ENCOUNTER — Ambulatory Visit: Payer: Medicare Other | Admitting: Podiatry

## 2023-02-13 DIAGNOSIS — R296 Repeated falls: Secondary | ICD-10-CM | POA: Diagnosis not present

## 2023-02-13 DIAGNOSIS — F0280 Dementia in other diseases classified elsewhere without behavioral disturbance: Secondary | ICD-10-CM | POA: Diagnosis not present

## 2023-02-13 DIAGNOSIS — I10 Essential (primary) hypertension: Secondary | ICD-10-CM | POA: Diagnosis not present

## 2023-02-13 DIAGNOSIS — N390 Urinary tract infection, site not specified: Secondary | ICD-10-CM | POA: Diagnosis not present

## 2023-02-13 DIAGNOSIS — F0283 Dementia in other diseases classified elsewhere, unspecified severity, with mood disturbance: Secondary | ICD-10-CM | POA: Diagnosis not present

## 2023-02-13 DIAGNOSIS — Z6820 Body mass index (BMI) 20.0-20.9, adult: Secondary | ICD-10-CM | POA: Diagnosis not present

## 2023-02-13 DIAGNOSIS — F329 Major depressive disorder, single episode, unspecified: Secondary | ICD-10-CM | POA: Diagnosis not present

## 2023-02-13 DIAGNOSIS — E43 Unspecified severe protein-calorie malnutrition: Secondary | ICD-10-CM | POA: Diagnosis not present

## 2023-02-13 DIAGNOSIS — I482 Chronic atrial fibrillation, unspecified: Secondary | ICD-10-CM | POA: Diagnosis not present

## 2023-02-13 DIAGNOSIS — G311 Senile degeneration of brain, not elsewhere classified: Secondary | ICD-10-CM | POA: Diagnosis not present

## 2023-02-13 DIAGNOSIS — F0284 Dementia in other diseases classified elsewhere, unspecified severity, with anxiety: Secondary | ICD-10-CM | POA: Diagnosis not present

## 2023-02-13 DIAGNOSIS — G4089 Other seizures: Secondary | ICD-10-CM | POA: Diagnosis not present

## 2023-02-16 DIAGNOSIS — E43 Unspecified severe protein-calorie malnutrition: Secondary | ICD-10-CM | POA: Diagnosis not present

## 2023-02-16 DIAGNOSIS — R296 Repeated falls: Secondary | ICD-10-CM | POA: Diagnosis not present

## 2023-02-16 DIAGNOSIS — F0283 Dementia in other diseases classified elsewhere, unspecified severity, with mood disturbance: Secondary | ICD-10-CM | POA: Diagnosis not present

## 2023-02-16 DIAGNOSIS — F0284 Dementia in other diseases classified elsewhere, unspecified severity, with anxiety: Secondary | ICD-10-CM | POA: Diagnosis not present

## 2023-02-16 DIAGNOSIS — Z6820 Body mass index (BMI) 20.0-20.9, adult: Secondary | ICD-10-CM | POA: Diagnosis not present

## 2023-02-16 DIAGNOSIS — G311 Senile degeneration of brain, not elsewhere classified: Secondary | ICD-10-CM | POA: Diagnosis not present

## 2023-02-18 DIAGNOSIS — F0283 Dementia in other diseases classified elsewhere, unspecified severity, with mood disturbance: Secondary | ICD-10-CM | POA: Diagnosis not present

## 2023-02-18 DIAGNOSIS — R296 Repeated falls: Secondary | ICD-10-CM | POA: Diagnosis not present

## 2023-02-18 DIAGNOSIS — E43 Unspecified severe protein-calorie malnutrition: Secondary | ICD-10-CM | POA: Diagnosis not present

## 2023-02-18 DIAGNOSIS — G311 Senile degeneration of brain, not elsewhere classified: Secondary | ICD-10-CM | POA: Diagnosis not present

## 2023-02-18 DIAGNOSIS — F0284 Dementia in other diseases classified elsewhere, unspecified severity, with anxiety: Secondary | ICD-10-CM | POA: Diagnosis not present

## 2023-02-18 DIAGNOSIS — Z6820 Body mass index (BMI) 20.0-20.9, adult: Secondary | ICD-10-CM | POA: Diagnosis not present

## 2023-02-19 DIAGNOSIS — G311 Senile degeneration of brain, not elsewhere classified: Secondary | ICD-10-CM | POA: Diagnosis not present

## 2023-02-19 DIAGNOSIS — R296 Repeated falls: Secondary | ICD-10-CM | POA: Diagnosis not present

## 2023-02-19 DIAGNOSIS — F0283 Dementia in other diseases classified elsewhere, unspecified severity, with mood disturbance: Secondary | ICD-10-CM | POA: Diagnosis not present

## 2023-02-19 DIAGNOSIS — F0284 Dementia in other diseases classified elsewhere, unspecified severity, with anxiety: Secondary | ICD-10-CM | POA: Diagnosis not present

## 2023-02-19 DIAGNOSIS — E43 Unspecified severe protein-calorie malnutrition: Secondary | ICD-10-CM | POA: Diagnosis not present

## 2023-02-19 DIAGNOSIS — Z6820 Body mass index (BMI) 20.0-20.9, adult: Secondary | ICD-10-CM | POA: Diagnosis not present

## 2023-02-20 DIAGNOSIS — F0283 Dementia in other diseases classified elsewhere, unspecified severity, with mood disturbance: Secondary | ICD-10-CM | POA: Diagnosis not present

## 2023-02-20 DIAGNOSIS — F0284 Dementia in other diseases classified elsewhere, unspecified severity, with anxiety: Secondary | ICD-10-CM | POA: Diagnosis not present

## 2023-02-20 DIAGNOSIS — E43 Unspecified severe protein-calorie malnutrition: Secondary | ICD-10-CM | POA: Diagnosis not present

## 2023-02-20 DIAGNOSIS — R296 Repeated falls: Secondary | ICD-10-CM | POA: Diagnosis not present

## 2023-02-20 DIAGNOSIS — G311 Senile degeneration of brain, not elsewhere classified: Secondary | ICD-10-CM | POA: Diagnosis not present

## 2023-02-20 DIAGNOSIS — Z6820 Body mass index (BMI) 20.0-20.9, adult: Secondary | ICD-10-CM | POA: Diagnosis not present

## 2023-02-21 DIAGNOSIS — R251 Tremor, unspecified: Secondary | ICD-10-CM | POA: Diagnosis not present

## 2023-02-21 DIAGNOSIS — I10 Essential (primary) hypertension: Secondary | ICD-10-CM | POA: Diagnosis not present

## 2023-02-21 DIAGNOSIS — F0280 Dementia in other diseases classified elsewhere without behavioral disturbance: Secondary | ICD-10-CM | POA: Diagnosis not present

## 2023-02-21 DIAGNOSIS — I480 Paroxysmal atrial fibrillation: Secondary | ICD-10-CM | POA: Diagnosis not present

## 2023-02-21 DIAGNOSIS — M150 Primary generalized (osteo)arthritis: Secondary | ICD-10-CM | POA: Diagnosis not present

## 2023-02-21 DIAGNOSIS — K219 Gastro-esophageal reflux disease without esophagitis: Secondary | ICD-10-CM | POA: Diagnosis not present

## 2023-02-21 DIAGNOSIS — F329 Major depressive disorder, single episode, unspecified: Secondary | ICD-10-CM | POA: Diagnosis not present

## 2023-02-21 DIAGNOSIS — G4089 Other seizures: Secondary | ICD-10-CM | POA: Diagnosis not present

## 2023-02-21 DIAGNOSIS — R569 Unspecified convulsions: Secondary | ICD-10-CM | POA: Diagnosis not present

## 2023-02-23 DIAGNOSIS — E43 Unspecified severe protein-calorie malnutrition: Secondary | ICD-10-CM | POA: Diagnosis not present

## 2023-02-23 DIAGNOSIS — R296 Repeated falls: Secondary | ICD-10-CM | POA: Diagnosis not present

## 2023-02-23 DIAGNOSIS — F0284 Dementia in other diseases classified elsewhere, unspecified severity, with anxiety: Secondary | ICD-10-CM | POA: Diagnosis not present

## 2023-02-23 DIAGNOSIS — G311 Senile degeneration of brain, not elsewhere classified: Secondary | ICD-10-CM | POA: Diagnosis not present

## 2023-02-23 DIAGNOSIS — Z6820 Body mass index (BMI) 20.0-20.9, adult: Secondary | ICD-10-CM | POA: Diagnosis not present

## 2023-02-23 DIAGNOSIS — F0283 Dementia in other diseases classified elsewhere, unspecified severity, with mood disturbance: Secondary | ICD-10-CM | POA: Diagnosis not present

## 2023-02-25 DIAGNOSIS — E43 Unspecified severe protein-calorie malnutrition: Secondary | ICD-10-CM | POA: Diagnosis not present

## 2023-02-25 DIAGNOSIS — F0284 Dementia in other diseases classified elsewhere, unspecified severity, with anxiety: Secondary | ICD-10-CM | POA: Diagnosis not present

## 2023-02-25 DIAGNOSIS — F0283 Dementia in other diseases classified elsewhere, unspecified severity, with mood disturbance: Secondary | ICD-10-CM | POA: Diagnosis not present

## 2023-02-25 DIAGNOSIS — G311 Senile degeneration of brain, not elsewhere classified: Secondary | ICD-10-CM | POA: Diagnosis not present

## 2023-02-25 DIAGNOSIS — Z6820 Body mass index (BMI) 20.0-20.9, adult: Secondary | ICD-10-CM | POA: Diagnosis not present

## 2023-02-25 DIAGNOSIS — R296 Repeated falls: Secondary | ICD-10-CM | POA: Diagnosis not present

## 2023-02-27 DIAGNOSIS — Z6820 Body mass index (BMI) 20.0-20.9, adult: Secondary | ICD-10-CM | POA: Diagnosis not present

## 2023-02-27 DIAGNOSIS — R296 Repeated falls: Secondary | ICD-10-CM | POA: Diagnosis not present

## 2023-02-27 DIAGNOSIS — F0284 Dementia in other diseases classified elsewhere, unspecified severity, with anxiety: Secondary | ICD-10-CM | POA: Diagnosis not present

## 2023-02-27 DIAGNOSIS — F0283 Dementia in other diseases classified elsewhere, unspecified severity, with mood disturbance: Secondary | ICD-10-CM | POA: Diagnosis not present

## 2023-02-27 DIAGNOSIS — E43 Unspecified severe protein-calorie malnutrition: Secondary | ICD-10-CM | POA: Diagnosis not present

## 2023-02-27 DIAGNOSIS — G311 Senile degeneration of brain, not elsewhere classified: Secondary | ICD-10-CM | POA: Diagnosis not present

## 2023-03-02 DIAGNOSIS — G311 Senile degeneration of brain, not elsewhere classified: Secondary | ICD-10-CM | POA: Diagnosis not present

## 2023-03-02 DIAGNOSIS — R296 Repeated falls: Secondary | ICD-10-CM | POA: Diagnosis not present

## 2023-03-02 DIAGNOSIS — F0284 Dementia in other diseases classified elsewhere, unspecified severity, with anxiety: Secondary | ICD-10-CM | POA: Diagnosis not present

## 2023-03-02 DIAGNOSIS — Z6820 Body mass index (BMI) 20.0-20.9, adult: Secondary | ICD-10-CM | POA: Diagnosis not present

## 2023-03-02 DIAGNOSIS — E43 Unspecified severe protein-calorie malnutrition: Secondary | ICD-10-CM | POA: Diagnosis not present

## 2023-03-02 DIAGNOSIS — F0283 Dementia in other diseases classified elsewhere, unspecified severity, with mood disturbance: Secondary | ICD-10-CM | POA: Diagnosis not present

## 2023-03-04 DIAGNOSIS — F0283 Dementia in other diseases classified elsewhere, unspecified severity, with mood disturbance: Secondary | ICD-10-CM | POA: Diagnosis not present

## 2023-03-04 DIAGNOSIS — F0284 Dementia in other diseases classified elsewhere, unspecified severity, with anxiety: Secondary | ICD-10-CM | POA: Diagnosis not present

## 2023-03-04 DIAGNOSIS — G311 Senile degeneration of brain, not elsewhere classified: Secondary | ICD-10-CM | POA: Diagnosis not present

## 2023-03-04 DIAGNOSIS — Z6820 Body mass index (BMI) 20.0-20.9, adult: Secondary | ICD-10-CM | POA: Diagnosis not present

## 2023-03-04 DIAGNOSIS — E43 Unspecified severe protein-calorie malnutrition: Secondary | ICD-10-CM | POA: Diagnosis not present

## 2023-03-04 DIAGNOSIS — R296 Repeated falls: Secondary | ICD-10-CM | POA: Diagnosis not present

## 2023-03-06 DIAGNOSIS — G311 Senile degeneration of brain, not elsewhere classified: Secondary | ICD-10-CM | POA: Diagnosis not present

## 2023-03-06 DIAGNOSIS — R296 Repeated falls: Secondary | ICD-10-CM | POA: Diagnosis not present

## 2023-03-06 DIAGNOSIS — Z6820 Body mass index (BMI) 20.0-20.9, adult: Secondary | ICD-10-CM | POA: Diagnosis not present

## 2023-03-06 DIAGNOSIS — F0284 Dementia in other diseases classified elsewhere, unspecified severity, with anxiety: Secondary | ICD-10-CM | POA: Diagnosis not present

## 2023-03-06 DIAGNOSIS — E43 Unspecified severe protein-calorie malnutrition: Secondary | ICD-10-CM | POA: Diagnosis not present

## 2023-03-06 DIAGNOSIS — F0283 Dementia in other diseases classified elsewhere, unspecified severity, with mood disturbance: Secondary | ICD-10-CM | POA: Diagnosis not present

## 2023-03-09 DIAGNOSIS — F0283 Dementia in other diseases classified elsewhere, unspecified severity, with mood disturbance: Secondary | ICD-10-CM | POA: Diagnosis not present

## 2023-03-09 DIAGNOSIS — E43 Unspecified severe protein-calorie malnutrition: Secondary | ICD-10-CM | POA: Diagnosis not present

## 2023-03-09 DIAGNOSIS — G311 Senile degeneration of brain, not elsewhere classified: Secondary | ICD-10-CM | POA: Diagnosis not present

## 2023-03-09 DIAGNOSIS — R296 Repeated falls: Secondary | ICD-10-CM | POA: Diagnosis not present

## 2023-03-09 DIAGNOSIS — Z6820 Body mass index (BMI) 20.0-20.9, adult: Secondary | ICD-10-CM | POA: Diagnosis not present

## 2023-03-09 DIAGNOSIS — F0284 Dementia in other diseases classified elsewhere, unspecified severity, with anxiety: Secondary | ICD-10-CM | POA: Diagnosis not present

## 2023-03-10 DIAGNOSIS — I10 Essential (primary) hypertension: Secondary | ICD-10-CM | POA: Diagnosis not present

## 2023-03-10 DIAGNOSIS — I482 Chronic atrial fibrillation, unspecified: Secondary | ICD-10-CM | POA: Diagnosis not present

## 2023-03-10 DIAGNOSIS — F0280 Dementia in other diseases classified elsewhere without behavioral disturbance: Secondary | ICD-10-CM | POA: Diagnosis not present

## 2023-03-10 DIAGNOSIS — K219 Gastro-esophageal reflux disease without esophagitis: Secondary | ICD-10-CM | POA: Diagnosis not present

## 2023-03-11 DIAGNOSIS — Z6820 Body mass index (BMI) 20.0-20.9, adult: Secondary | ICD-10-CM | POA: Diagnosis not present

## 2023-03-11 DIAGNOSIS — I129 Hypertensive chronic kidney disease with stage 1 through stage 4 chronic kidney disease, or unspecified chronic kidney disease: Secondary | ICD-10-CM | POA: Diagnosis not present

## 2023-03-11 DIAGNOSIS — I4891 Unspecified atrial fibrillation: Secondary | ICD-10-CM | POA: Diagnosis not present

## 2023-03-11 DIAGNOSIS — R159 Full incontinence of feces: Secondary | ICD-10-CM | POA: Diagnosis not present

## 2023-03-11 DIAGNOSIS — E785 Hyperlipidemia, unspecified: Secondary | ICD-10-CM | POA: Diagnosis not present

## 2023-03-11 DIAGNOSIS — F0283 Dementia in other diseases classified elsewhere, unspecified severity, with mood disturbance: Secondary | ICD-10-CM | POA: Diagnosis not present

## 2023-03-11 DIAGNOSIS — R32 Unspecified urinary incontinence: Secondary | ICD-10-CM | POA: Diagnosis not present

## 2023-03-11 DIAGNOSIS — N1831 Chronic kidney disease, stage 3a: Secondary | ICD-10-CM | POA: Diagnosis not present

## 2023-03-11 DIAGNOSIS — R296 Repeated falls: Secondary | ICD-10-CM | POA: Diagnosis not present

## 2023-03-11 DIAGNOSIS — G40909 Epilepsy, unspecified, not intractable, without status epilepticus: Secondary | ICD-10-CM | POA: Diagnosis not present

## 2023-03-11 DIAGNOSIS — Z741 Need for assistance with personal care: Secondary | ICD-10-CM | POA: Diagnosis not present

## 2023-03-11 DIAGNOSIS — E43 Unspecified severe protein-calorie malnutrition: Secondary | ICD-10-CM | POA: Diagnosis not present

## 2023-03-11 DIAGNOSIS — Z8719 Personal history of other diseases of the digestive system: Secondary | ICD-10-CM | POA: Diagnosis not present

## 2023-03-11 DIAGNOSIS — L299 Pruritus, unspecified: Secondary | ICD-10-CM | POA: Diagnosis not present

## 2023-03-11 DIAGNOSIS — Z87440 Personal history of urinary (tract) infections: Secondary | ICD-10-CM | POA: Diagnosis not present

## 2023-03-11 DIAGNOSIS — K219 Gastro-esophageal reflux disease without esophagitis: Secondary | ICD-10-CM | POA: Diagnosis not present

## 2023-03-11 DIAGNOSIS — D631 Anemia in chronic kidney disease: Secondary | ICD-10-CM | POA: Diagnosis not present

## 2023-03-11 DIAGNOSIS — H409 Unspecified glaucoma: Secondary | ICD-10-CM | POA: Diagnosis not present

## 2023-03-11 DIAGNOSIS — E538 Deficiency of other specified B group vitamins: Secondary | ICD-10-CM | POA: Diagnosis not present

## 2023-03-11 DIAGNOSIS — F0284 Dementia in other diseases classified elsewhere, unspecified severity, with anxiety: Secondary | ICD-10-CM | POA: Diagnosis not present

## 2023-03-11 DIAGNOSIS — M810 Age-related osteoporosis without current pathological fracture: Secondary | ICD-10-CM | POA: Diagnosis not present

## 2023-03-11 DIAGNOSIS — G311 Senile degeneration of brain, not elsewhere classified: Secondary | ICD-10-CM | POA: Diagnosis not present

## 2023-03-12 DIAGNOSIS — F0283 Dementia in other diseases classified elsewhere, unspecified severity, with mood disturbance: Secondary | ICD-10-CM | POA: Diagnosis not present

## 2023-03-12 DIAGNOSIS — G311 Senile degeneration of brain, not elsewhere classified: Secondary | ICD-10-CM | POA: Diagnosis not present

## 2023-03-12 DIAGNOSIS — E43 Unspecified severe protein-calorie malnutrition: Secondary | ICD-10-CM | POA: Diagnosis not present

## 2023-03-12 DIAGNOSIS — R296 Repeated falls: Secondary | ICD-10-CM | POA: Diagnosis not present

## 2023-03-12 DIAGNOSIS — F0284 Dementia in other diseases classified elsewhere, unspecified severity, with anxiety: Secondary | ICD-10-CM | POA: Diagnosis not present

## 2023-03-12 DIAGNOSIS — Z6820 Body mass index (BMI) 20.0-20.9, adult: Secondary | ICD-10-CM | POA: Diagnosis not present

## 2023-03-13 DIAGNOSIS — G311 Senile degeneration of brain, not elsewhere classified: Secondary | ICD-10-CM | POA: Diagnosis not present

## 2023-03-13 DIAGNOSIS — F0284 Dementia in other diseases classified elsewhere, unspecified severity, with anxiety: Secondary | ICD-10-CM | POA: Diagnosis not present

## 2023-03-13 DIAGNOSIS — G9340 Encephalopathy, unspecified: Secondary | ICD-10-CM | POA: Diagnosis not present

## 2023-03-13 DIAGNOSIS — E43 Unspecified severe protein-calorie malnutrition: Secondary | ICD-10-CM | POA: Diagnosis not present

## 2023-03-13 DIAGNOSIS — R296 Repeated falls: Secondary | ICD-10-CM | POA: Diagnosis not present

## 2023-03-13 DIAGNOSIS — F0283 Dementia in other diseases classified elsewhere, unspecified severity, with mood disturbance: Secondary | ICD-10-CM | POA: Diagnosis not present

## 2023-03-13 DIAGNOSIS — D649 Anemia, unspecified: Secondary | ICD-10-CM | POA: Diagnosis not present

## 2023-03-13 DIAGNOSIS — F0390 Unspecified dementia without behavioral disturbance: Secondary | ICD-10-CM | POA: Diagnosis not present

## 2023-03-13 DIAGNOSIS — G40909 Epilepsy, unspecified, not intractable, without status epilepticus: Secondary | ICD-10-CM | POA: Diagnosis not present

## 2023-03-13 DIAGNOSIS — Z6820 Body mass index (BMI) 20.0-20.9, adult: Secondary | ICD-10-CM | POA: Diagnosis not present

## 2023-03-15 DIAGNOSIS — R296 Repeated falls: Secondary | ICD-10-CM | POA: Diagnosis not present

## 2023-03-15 DIAGNOSIS — F0283 Dementia in other diseases classified elsewhere, unspecified severity, with mood disturbance: Secondary | ICD-10-CM | POA: Diagnosis not present

## 2023-03-15 DIAGNOSIS — F0284 Dementia in other diseases classified elsewhere, unspecified severity, with anxiety: Secondary | ICD-10-CM | POA: Diagnosis not present

## 2023-03-15 DIAGNOSIS — E43 Unspecified severe protein-calorie malnutrition: Secondary | ICD-10-CM | POA: Diagnosis not present

## 2023-03-15 DIAGNOSIS — G311 Senile degeneration of brain, not elsewhere classified: Secondary | ICD-10-CM | POA: Diagnosis not present

## 2023-03-15 DIAGNOSIS — Z6820 Body mass index (BMI) 20.0-20.9, adult: Secondary | ICD-10-CM | POA: Diagnosis not present

## 2023-03-17 DIAGNOSIS — Z6820 Body mass index (BMI) 20.0-20.9, adult: Secondary | ICD-10-CM | POA: Diagnosis not present

## 2023-03-17 DIAGNOSIS — F0283 Dementia in other diseases classified elsewhere, unspecified severity, with mood disturbance: Secondary | ICD-10-CM | POA: Diagnosis not present

## 2023-03-17 DIAGNOSIS — R296 Repeated falls: Secondary | ICD-10-CM | POA: Diagnosis not present

## 2023-03-17 DIAGNOSIS — E43 Unspecified severe protein-calorie malnutrition: Secondary | ICD-10-CM | POA: Diagnosis not present

## 2023-03-17 DIAGNOSIS — G311 Senile degeneration of brain, not elsewhere classified: Secondary | ICD-10-CM | POA: Diagnosis not present

## 2023-03-17 DIAGNOSIS — F0284 Dementia in other diseases classified elsewhere, unspecified severity, with anxiety: Secondary | ICD-10-CM | POA: Diagnosis not present

## 2023-03-18 DIAGNOSIS — F0284 Dementia in other diseases classified elsewhere, unspecified severity, with anxiety: Secondary | ICD-10-CM | POA: Diagnosis not present

## 2023-03-18 DIAGNOSIS — I482 Chronic atrial fibrillation, unspecified: Secondary | ICD-10-CM | POA: Diagnosis not present

## 2023-03-18 DIAGNOSIS — G311 Senile degeneration of brain, not elsewhere classified: Secondary | ICD-10-CM | POA: Diagnosis not present

## 2023-03-18 DIAGNOSIS — I10 Essential (primary) hypertension: Secondary | ICD-10-CM | POA: Diagnosis not present

## 2023-03-18 DIAGNOSIS — E43 Unspecified severe protein-calorie malnutrition: Secondary | ICD-10-CM | POA: Diagnosis not present

## 2023-03-18 DIAGNOSIS — F0283 Dementia in other diseases classified elsewhere, unspecified severity, with mood disturbance: Secondary | ICD-10-CM | POA: Diagnosis not present

## 2023-03-18 DIAGNOSIS — R059 Cough, unspecified: Secondary | ICD-10-CM | POA: Diagnosis not present

## 2023-03-18 DIAGNOSIS — Z6820 Body mass index (BMI) 20.0-20.9, adult: Secondary | ICD-10-CM | POA: Diagnosis not present

## 2023-03-18 DIAGNOSIS — F0280 Dementia in other diseases classified elsewhere without behavioral disturbance: Secondary | ICD-10-CM | POA: Diagnosis not present

## 2023-03-18 DIAGNOSIS — R296 Repeated falls: Secondary | ICD-10-CM | POA: Diagnosis not present

## 2023-03-20 DIAGNOSIS — E43 Unspecified severe protein-calorie malnutrition: Secondary | ICD-10-CM | POA: Diagnosis not present

## 2023-03-20 DIAGNOSIS — F0283 Dementia in other diseases classified elsewhere, unspecified severity, with mood disturbance: Secondary | ICD-10-CM | POA: Diagnosis not present

## 2023-03-20 DIAGNOSIS — Z6820 Body mass index (BMI) 20.0-20.9, adult: Secondary | ICD-10-CM | POA: Diagnosis not present

## 2023-03-20 DIAGNOSIS — F0284 Dementia in other diseases classified elsewhere, unspecified severity, with anxiety: Secondary | ICD-10-CM | POA: Diagnosis not present

## 2023-03-20 DIAGNOSIS — R296 Repeated falls: Secondary | ICD-10-CM | POA: Diagnosis not present

## 2023-03-20 DIAGNOSIS — G311 Senile degeneration of brain, not elsewhere classified: Secondary | ICD-10-CM | POA: Diagnosis not present

## 2023-03-23 DIAGNOSIS — F0284 Dementia in other diseases classified elsewhere, unspecified severity, with anxiety: Secondary | ICD-10-CM | POA: Diagnosis not present

## 2023-03-23 DIAGNOSIS — E43 Unspecified severe protein-calorie malnutrition: Secondary | ICD-10-CM | POA: Diagnosis not present

## 2023-03-23 DIAGNOSIS — F0283 Dementia in other diseases classified elsewhere, unspecified severity, with mood disturbance: Secondary | ICD-10-CM | POA: Diagnosis not present

## 2023-03-23 DIAGNOSIS — G311 Senile degeneration of brain, not elsewhere classified: Secondary | ICD-10-CM | POA: Diagnosis not present

## 2023-03-23 DIAGNOSIS — Z6820 Body mass index (BMI) 20.0-20.9, adult: Secondary | ICD-10-CM | POA: Diagnosis not present

## 2023-03-23 DIAGNOSIS — R296 Repeated falls: Secondary | ICD-10-CM | POA: Diagnosis not present

## 2023-03-25 DIAGNOSIS — Z6820 Body mass index (BMI) 20.0-20.9, adult: Secondary | ICD-10-CM | POA: Diagnosis not present

## 2023-03-25 DIAGNOSIS — F0283 Dementia in other diseases classified elsewhere, unspecified severity, with mood disturbance: Secondary | ICD-10-CM | POA: Diagnosis not present

## 2023-03-25 DIAGNOSIS — R296 Repeated falls: Secondary | ICD-10-CM | POA: Diagnosis not present

## 2023-03-25 DIAGNOSIS — F0284 Dementia in other diseases classified elsewhere, unspecified severity, with anxiety: Secondary | ICD-10-CM | POA: Diagnosis not present

## 2023-03-25 DIAGNOSIS — G311 Senile degeneration of brain, not elsewhere classified: Secondary | ICD-10-CM | POA: Diagnosis not present

## 2023-03-25 DIAGNOSIS — E43 Unspecified severe protein-calorie malnutrition: Secondary | ICD-10-CM | POA: Diagnosis not present

## 2023-03-26 DIAGNOSIS — Z6820 Body mass index (BMI) 20.0-20.9, adult: Secondary | ICD-10-CM | POA: Diagnosis not present

## 2023-03-26 DIAGNOSIS — G311 Senile degeneration of brain, not elsewhere classified: Secondary | ICD-10-CM | POA: Diagnosis not present

## 2023-03-26 DIAGNOSIS — R296 Repeated falls: Secondary | ICD-10-CM | POA: Diagnosis not present

## 2023-03-26 DIAGNOSIS — F0284 Dementia in other diseases classified elsewhere, unspecified severity, with anxiety: Secondary | ICD-10-CM | POA: Diagnosis not present

## 2023-03-26 DIAGNOSIS — E43 Unspecified severe protein-calorie malnutrition: Secondary | ICD-10-CM | POA: Diagnosis not present

## 2023-03-26 DIAGNOSIS — F0283 Dementia in other diseases classified elsewhere, unspecified severity, with mood disturbance: Secondary | ICD-10-CM | POA: Diagnosis not present

## 2023-03-27 DIAGNOSIS — E43 Unspecified severe protein-calorie malnutrition: Secondary | ICD-10-CM | POA: Diagnosis not present

## 2023-03-27 DIAGNOSIS — F0284 Dementia in other diseases classified elsewhere, unspecified severity, with anxiety: Secondary | ICD-10-CM | POA: Diagnosis not present

## 2023-03-27 DIAGNOSIS — Z6820 Body mass index (BMI) 20.0-20.9, adult: Secondary | ICD-10-CM | POA: Diagnosis not present

## 2023-03-27 DIAGNOSIS — G311 Senile degeneration of brain, not elsewhere classified: Secondary | ICD-10-CM | POA: Diagnosis not present

## 2023-03-27 DIAGNOSIS — F0283 Dementia in other diseases classified elsewhere, unspecified severity, with mood disturbance: Secondary | ICD-10-CM | POA: Diagnosis not present

## 2023-03-27 DIAGNOSIS — R296 Repeated falls: Secondary | ICD-10-CM | POA: Diagnosis not present

## 2023-03-30 DIAGNOSIS — F0283 Dementia in other diseases classified elsewhere, unspecified severity, with mood disturbance: Secondary | ICD-10-CM | POA: Diagnosis not present

## 2023-03-30 DIAGNOSIS — E43 Unspecified severe protein-calorie malnutrition: Secondary | ICD-10-CM | POA: Diagnosis not present

## 2023-03-30 DIAGNOSIS — Z6820 Body mass index (BMI) 20.0-20.9, adult: Secondary | ICD-10-CM | POA: Diagnosis not present

## 2023-03-30 DIAGNOSIS — F0284 Dementia in other diseases classified elsewhere, unspecified severity, with anxiety: Secondary | ICD-10-CM | POA: Diagnosis not present

## 2023-03-30 DIAGNOSIS — R296 Repeated falls: Secondary | ICD-10-CM | POA: Diagnosis not present

## 2023-03-30 DIAGNOSIS — G311 Senile degeneration of brain, not elsewhere classified: Secondary | ICD-10-CM | POA: Diagnosis not present

## 2023-04-01 DIAGNOSIS — F0283 Dementia in other diseases classified elsewhere, unspecified severity, with mood disturbance: Secondary | ICD-10-CM | POA: Diagnosis not present

## 2023-04-01 DIAGNOSIS — R296 Repeated falls: Secondary | ICD-10-CM | POA: Diagnosis not present

## 2023-04-01 DIAGNOSIS — E43 Unspecified severe protein-calorie malnutrition: Secondary | ICD-10-CM | POA: Diagnosis not present

## 2023-04-01 DIAGNOSIS — G311 Senile degeneration of brain, not elsewhere classified: Secondary | ICD-10-CM | POA: Diagnosis not present

## 2023-04-01 DIAGNOSIS — F0284 Dementia in other diseases classified elsewhere, unspecified severity, with anxiety: Secondary | ICD-10-CM | POA: Diagnosis not present

## 2023-04-01 DIAGNOSIS — Z6820 Body mass index (BMI) 20.0-20.9, adult: Secondary | ICD-10-CM | POA: Diagnosis not present

## 2023-04-03 DIAGNOSIS — F0284 Dementia in other diseases classified elsewhere, unspecified severity, with anxiety: Secondary | ICD-10-CM | POA: Diagnosis not present

## 2023-04-03 DIAGNOSIS — G311 Senile degeneration of brain, not elsewhere classified: Secondary | ICD-10-CM | POA: Diagnosis not present

## 2023-04-03 DIAGNOSIS — E43 Unspecified severe protein-calorie malnutrition: Secondary | ICD-10-CM | POA: Diagnosis not present

## 2023-04-03 DIAGNOSIS — F0283 Dementia in other diseases classified elsewhere, unspecified severity, with mood disturbance: Secondary | ICD-10-CM | POA: Diagnosis not present

## 2023-04-03 DIAGNOSIS — Z6820 Body mass index (BMI) 20.0-20.9, adult: Secondary | ICD-10-CM | POA: Diagnosis not present

## 2023-04-03 DIAGNOSIS — R296 Repeated falls: Secondary | ICD-10-CM | POA: Diagnosis not present

## 2023-04-04 DIAGNOSIS — K219 Gastro-esophageal reflux disease without esophagitis: Secondary | ICD-10-CM | POA: Diagnosis not present

## 2023-04-04 DIAGNOSIS — G4089 Other seizures: Secondary | ICD-10-CM | POA: Diagnosis not present

## 2023-04-04 DIAGNOSIS — F329 Major depressive disorder, single episode, unspecified: Secondary | ICD-10-CM | POA: Diagnosis not present

## 2023-04-04 DIAGNOSIS — F0280 Dementia in other diseases classified elsewhere without behavioral disturbance: Secondary | ICD-10-CM | POA: Diagnosis not present

## 2023-04-04 DIAGNOSIS — R569 Unspecified convulsions: Secondary | ICD-10-CM | POA: Diagnosis not present

## 2023-04-04 DIAGNOSIS — I482 Chronic atrial fibrillation, unspecified: Secondary | ICD-10-CM | POA: Diagnosis not present

## 2023-04-04 DIAGNOSIS — M150 Primary generalized (osteo)arthritis: Secondary | ICD-10-CM | POA: Diagnosis not present

## 2023-04-04 DIAGNOSIS — I10 Essential (primary) hypertension: Secondary | ICD-10-CM | POA: Diagnosis not present

## 2023-04-04 DIAGNOSIS — F0390 Unspecified dementia without behavioral disturbance: Secondary | ICD-10-CM | POA: Diagnosis not present

## 2023-04-04 DIAGNOSIS — K5909 Other constipation: Secondary | ICD-10-CM | POA: Diagnosis not present

## 2023-04-06 DIAGNOSIS — R296 Repeated falls: Secondary | ICD-10-CM | POA: Diagnosis not present

## 2023-04-06 DIAGNOSIS — F0284 Dementia in other diseases classified elsewhere, unspecified severity, with anxiety: Secondary | ICD-10-CM | POA: Diagnosis not present

## 2023-04-06 DIAGNOSIS — F0283 Dementia in other diseases classified elsewhere, unspecified severity, with mood disturbance: Secondary | ICD-10-CM | POA: Diagnosis not present

## 2023-04-06 DIAGNOSIS — K219 Gastro-esophageal reflux disease without esophagitis: Secondary | ICD-10-CM | POA: Diagnosis not present

## 2023-04-06 DIAGNOSIS — G4089 Other seizures: Secondary | ICD-10-CM | POA: Diagnosis not present

## 2023-04-06 DIAGNOSIS — Z6820 Body mass index (BMI) 20.0-20.9, adult: Secondary | ICD-10-CM | POA: Diagnosis not present

## 2023-04-06 DIAGNOSIS — G311 Senile degeneration of brain, not elsewhere classified: Secondary | ICD-10-CM | POA: Diagnosis not present

## 2023-04-06 DIAGNOSIS — F0280 Dementia in other diseases classified elsewhere without behavioral disturbance: Secondary | ICD-10-CM | POA: Diagnosis not present

## 2023-04-06 DIAGNOSIS — I480 Paroxysmal atrial fibrillation: Secondary | ICD-10-CM | POA: Diagnosis not present

## 2023-04-06 DIAGNOSIS — E43 Unspecified severe protein-calorie malnutrition: Secondary | ICD-10-CM | POA: Diagnosis not present

## 2023-04-06 DIAGNOSIS — I10 Essential (primary) hypertension: Secondary | ICD-10-CM | POA: Diagnosis not present

## 2023-04-06 DIAGNOSIS — F329 Major depressive disorder, single episode, unspecified: Secondary | ICD-10-CM | POA: Diagnosis not present

## 2023-04-06 DIAGNOSIS — M150 Primary generalized (osteo)arthritis: Secondary | ICD-10-CM | POA: Diagnosis not present

## 2023-04-06 DIAGNOSIS — R251 Tremor, unspecified: Secondary | ICD-10-CM | POA: Diagnosis not present

## 2023-04-07 DIAGNOSIS — I482 Chronic atrial fibrillation, unspecified: Secondary | ICD-10-CM | POA: Diagnosis not present

## 2023-04-07 DIAGNOSIS — I10 Essential (primary) hypertension: Secondary | ICD-10-CM | POA: Diagnosis not present

## 2023-04-07 DIAGNOSIS — F0390 Unspecified dementia without behavioral disturbance: Secondary | ICD-10-CM | POA: Diagnosis not present

## 2023-04-07 DIAGNOSIS — W19XXXD Unspecified fall, subsequent encounter: Secondary | ICD-10-CM | POA: Diagnosis not present

## 2023-04-08 DIAGNOSIS — I10 Essential (primary) hypertension: Secondary | ICD-10-CM | POA: Diagnosis not present

## 2023-04-08 DIAGNOSIS — D649 Anemia, unspecified: Secondary | ICD-10-CM | POA: Diagnosis not present

## 2023-04-08 DIAGNOSIS — H409 Unspecified glaucoma: Secondary | ICD-10-CM | POA: Diagnosis not present

## 2023-04-08 DIAGNOSIS — F0284 Dementia in other diseases classified elsewhere, unspecified severity, with anxiety: Secondary | ICD-10-CM | POA: Diagnosis not present

## 2023-04-08 DIAGNOSIS — E43 Unspecified severe protein-calorie malnutrition: Secondary | ICD-10-CM | POA: Diagnosis not present

## 2023-04-08 DIAGNOSIS — R569 Unspecified convulsions: Secondary | ICD-10-CM | POA: Diagnosis not present

## 2023-04-08 DIAGNOSIS — R296 Repeated falls: Secondary | ICD-10-CM | POA: Diagnosis not present

## 2023-04-08 DIAGNOSIS — F331 Major depressive disorder, recurrent, moderate: Secondary | ICD-10-CM | POA: Diagnosis not present

## 2023-04-08 DIAGNOSIS — I4891 Unspecified atrial fibrillation: Secondary | ICD-10-CM | POA: Diagnosis not present

## 2023-04-08 DIAGNOSIS — F0390 Unspecified dementia without behavioral disturbance: Secondary | ICD-10-CM | POA: Diagnosis not present

## 2023-04-08 DIAGNOSIS — Z6820 Body mass index (BMI) 20.0-20.9, adult: Secondary | ICD-10-CM | POA: Diagnosis not present

## 2023-04-08 DIAGNOSIS — R293 Abnormal posture: Secondary | ICD-10-CM | POA: Diagnosis not present

## 2023-04-08 DIAGNOSIS — G311 Senile degeneration of brain, not elsewhere classified: Secondary | ICD-10-CM | POA: Diagnosis not present

## 2023-04-08 DIAGNOSIS — E785 Hyperlipidemia, unspecified: Secondary | ICD-10-CM | POA: Diagnosis not present

## 2023-04-08 DIAGNOSIS — F0283 Dementia in other diseases classified elsewhere, unspecified severity, with mood disturbance: Secondary | ICD-10-CM | POA: Diagnosis not present

## 2023-04-09 DIAGNOSIS — R296 Repeated falls: Secondary | ICD-10-CM | POA: Diagnosis not present

## 2023-04-09 DIAGNOSIS — F0284 Dementia in other diseases classified elsewhere, unspecified severity, with anxiety: Secondary | ICD-10-CM | POA: Diagnosis not present

## 2023-04-09 DIAGNOSIS — G311 Senile degeneration of brain, not elsewhere classified: Secondary | ICD-10-CM | POA: Diagnosis not present

## 2023-04-09 DIAGNOSIS — F0283 Dementia in other diseases classified elsewhere, unspecified severity, with mood disturbance: Secondary | ICD-10-CM | POA: Diagnosis not present

## 2023-04-09 DIAGNOSIS — Z6820 Body mass index (BMI) 20.0-20.9, adult: Secondary | ICD-10-CM | POA: Diagnosis not present

## 2023-04-09 DIAGNOSIS — E43 Unspecified severe protein-calorie malnutrition: Secondary | ICD-10-CM | POA: Diagnosis not present

## 2023-04-11 DIAGNOSIS — R296 Repeated falls: Secondary | ICD-10-CM | POA: Diagnosis not present

## 2023-04-11 DIAGNOSIS — Z8719 Personal history of other diseases of the digestive system: Secondary | ICD-10-CM | POA: Diagnosis not present

## 2023-04-11 DIAGNOSIS — H409 Unspecified glaucoma: Secondary | ICD-10-CM | POA: Diagnosis not present

## 2023-04-11 DIAGNOSIS — E538 Deficiency of other specified B group vitamins: Secondary | ICD-10-CM | POA: Diagnosis not present

## 2023-04-11 DIAGNOSIS — R32 Unspecified urinary incontinence: Secondary | ICD-10-CM | POA: Diagnosis not present

## 2023-04-11 DIAGNOSIS — I129 Hypertensive chronic kidney disease with stage 1 through stage 4 chronic kidney disease, or unspecified chronic kidney disease: Secondary | ICD-10-CM | POA: Diagnosis not present

## 2023-04-11 DIAGNOSIS — G311 Senile degeneration of brain, not elsewhere classified: Secondary | ICD-10-CM | POA: Diagnosis not present

## 2023-04-11 DIAGNOSIS — Z6820 Body mass index (BMI) 20.0-20.9, adult: Secondary | ICD-10-CM | POA: Diagnosis not present

## 2023-04-11 DIAGNOSIS — F0284 Dementia in other diseases classified elsewhere, unspecified severity, with anxiety: Secondary | ICD-10-CM | POA: Diagnosis not present

## 2023-04-11 DIAGNOSIS — E43 Unspecified severe protein-calorie malnutrition: Secondary | ICD-10-CM | POA: Diagnosis not present

## 2023-04-11 DIAGNOSIS — K219 Gastro-esophageal reflux disease without esophagitis: Secondary | ICD-10-CM | POA: Diagnosis not present

## 2023-04-11 DIAGNOSIS — N1831 Chronic kidney disease, stage 3a: Secondary | ICD-10-CM | POA: Diagnosis not present

## 2023-04-11 DIAGNOSIS — F0283 Dementia in other diseases classified elsewhere, unspecified severity, with mood disturbance: Secondary | ICD-10-CM | POA: Diagnosis not present

## 2023-04-11 DIAGNOSIS — Z741 Need for assistance with personal care: Secondary | ICD-10-CM | POA: Diagnosis not present

## 2023-04-11 DIAGNOSIS — Z87440 Personal history of urinary (tract) infections: Secondary | ICD-10-CM | POA: Diagnosis not present

## 2023-04-11 DIAGNOSIS — I4891 Unspecified atrial fibrillation: Secondary | ICD-10-CM | POA: Diagnosis not present

## 2023-04-11 DIAGNOSIS — D631 Anemia in chronic kidney disease: Secondary | ICD-10-CM | POA: Diagnosis not present

## 2023-04-11 DIAGNOSIS — L299 Pruritus, unspecified: Secondary | ICD-10-CM | POA: Diagnosis not present

## 2023-04-11 DIAGNOSIS — M810 Age-related osteoporosis without current pathological fracture: Secondary | ICD-10-CM | POA: Diagnosis not present

## 2023-04-11 DIAGNOSIS — E785 Hyperlipidemia, unspecified: Secondary | ICD-10-CM | POA: Diagnosis not present

## 2023-04-11 DIAGNOSIS — G40909 Epilepsy, unspecified, not intractable, without status epilepticus: Secondary | ICD-10-CM | POA: Diagnosis not present

## 2023-04-11 DIAGNOSIS — R159 Full incontinence of feces: Secondary | ICD-10-CM | POA: Diagnosis not present

## 2023-04-13 DIAGNOSIS — Z6820 Body mass index (BMI) 20.0-20.9, adult: Secondary | ICD-10-CM | POA: Diagnosis not present

## 2023-04-13 DIAGNOSIS — G311 Senile degeneration of brain, not elsewhere classified: Secondary | ICD-10-CM | POA: Diagnosis not present

## 2023-04-13 DIAGNOSIS — R296 Repeated falls: Secondary | ICD-10-CM | POA: Diagnosis not present

## 2023-04-13 DIAGNOSIS — F0284 Dementia in other diseases classified elsewhere, unspecified severity, with anxiety: Secondary | ICD-10-CM | POA: Diagnosis not present

## 2023-04-13 DIAGNOSIS — F0283 Dementia in other diseases classified elsewhere, unspecified severity, with mood disturbance: Secondary | ICD-10-CM | POA: Diagnosis not present

## 2023-04-13 DIAGNOSIS — E43 Unspecified severe protein-calorie malnutrition: Secondary | ICD-10-CM | POA: Diagnosis not present

## 2023-04-15 DIAGNOSIS — R21 Rash and other nonspecific skin eruption: Secondary | ICD-10-CM | POA: Diagnosis not present

## 2023-04-15 DIAGNOSIS — Z6820 Body mass index (BMI) 20.0-20.9, adult: Secondary | ICD-10-CM | POA: Diagnosis not present

## 2023-04-15 DIAGNOSIS — I10 Essential (primary) hypertension: Secondary | ICD-10-CM | POA: Diagnosis not present

## 2023-04-15 DIAGNOSIS — I482 Chronic atrial fibrillation, unspecified: Secondary | ICD-10-CM | POA: Diagnosis not present

## 2023-04-15 DIAGNOSIS — G311 Senile degeneration of brain, not elsewhere classified: Secondary | ICD-10-CM | POA: Diagnosis not present

## 2023-04-15 DIAGNOSIS — R296 Repeated falls: Secondary | ICD-10-CM | POA: Diagnosis not present

## 2023-04-15 DIAGNOSIS — F0390 Unspecified dementia without behavioral disturbance: Secondary | ICD-10-CM | POA: Diagnosis not present

## 2023-04-15 DIAGNOSIS — E43 Unspecified severe protein-calorie malnutrition: Secondary | ICD-10-CM | POA: Diagnosis not present

## 2023-04-15 DIAGNOSIS — F0283 Dementia in other diseases classified elsewhere, unspecified severity, with mood disturbance: Secondary | ICD-10-CM | POA: Diagnosis not present

## 2023-04-15 DIAGNOSIS — K219 Gastro-esophageal reflux disease without esophagitis: Secondary | ICD-10-CM | POA: Diagnosis not present

## 2023-04-15 DIAGNOSIS — F0284 Dementia in other diseases classified elsewhere, unspecified severity, with anxiety: Secondary | ICD-10-CM | POA: Diagnosis not present

## 2023-04-16 DIAGNOSIS — G311 Senile degeneration of brain, not elsewhere classified: Secondary | ICD-10-CM | POA: Diagnosis not present

## 2023-04-16 DIAGNOSIS — E43 Unspecified severe protein-calorie malnutrition: Secondary | ICD-10-CM | POA: Diagnosis not present

## 2023-04-16 DIAGNOSIS — Z6820 Body mass index (BMI) 20.0-20.9, adult: Secondary | ICD-10-CM | POA: Diagnosis not present

## 2023-04-16 DIAGNOSIS — F0283 Dementia in other diseases classified elsewhere, unspecified severity, with mood disturbance: Secondary | ICD-10-CM | POA: Diagnosis not present

## 2023-04-16 DIAGNOSIS — R296 Repeated falls: Secondary | ICD-10-CM | POA: Diagnosis not present

## 2023-04-16 DIAGNOSIS — F0284 Dementia in other diseases classified elsewhere, unspecified severity, with anxiety: Secondary | ICD-10-CM | POA: Diagnosis not present

## 2023-04-17 DIAGNOSIS — D649 Anemia, unspecified: Secondary | ICD-10-CM | POA: Diagnosis not present

## 2023-04-17 DIAGNOSIS — G9340 Encephalopathy, unspecified: Secondary | ICD-10-CM | POA: Diagnosis not present

## 2023-04-17 DIAGNOSIS — E43 Unspecified severe protein-calorie malnutrition: Secondary | ICD-10-CM | POA: Diagnosis not present

## 2023-04-17 DIAGNOSIS — F0283 Dementia in other diseases classified elsewhere, unspecified severity, with mood disturbance: Secondary | ICD-10-CM | POA: Diagnosis not present

## 2023-04-17 DIAGNOSIS — F0284 Dementia in other diseases classified elsewhere, unspecified severity, with anxiety: Secondary | ICD-10-CM | POA: Diagnosis not present

## 2023-04-17 DIAGNOSIS — R296 Repeated falls: Secondary | ICD-10-CM | POA: Diagnosis not present

## 2023-04-17 DIAGNOSIS — F0390 Unspecified dementia without behavioral disturbance: Secondary | ICD-10-CM | POA: Diagnosis not present

## 2023-04-17 DIAGNOSIS — Z6820 Body mass index (BMI) 20.0-20.9, adult: Secondary | ICD-10-CM | POA: Diagnosis not present

## 2023-04-17 DIAGNOSIS — G40909 Epilepsy, unspecified, not intractable, without status epilepticus: Secondary | ICD-10-CM | POA: Diagnosis not present

## 2023-04-17 DIAGNOSIS — G311 Senile degeneration of brain, not elsewhere classified: Secondary | ICD-10-CM | POA: Diagnosis not present

## 2023-04-20 DIAGNOSIS — E43 Unspecified severe protein-calorie malnutrition: Secondary | ICD-10-CM | POA: Diagnosis not present

## 2023-04-20 DIAGNOSIS — F0284 Dementia in other diseases classified elsewhere, unspecified severity, with anxiety: Secondary | ICD-10-CM | POA: Diagnosis not present

## 2023-04-20 DIAGNOSIS — F0283 Dementia in other diseases classified elsewhere, unspecified severity, with mood disturbance: Secondary | ICD-10-CM | POA: Diagnosis not present

## 2023-04-20 DIAGNOSIS — Z6820 Body mass index (BMI) 20.0-20.9, adult: Secondary | ICD-10-CM | POA: Diagnosis not present

## 2023-04-20 DIAGNOSIS — G311 Senile degeneration of brain, not elsewhere classified: Secondary | ICD-10-CM | POA: Diagnosis not present

## 2023-04-20 DIAGNOSIS — R296 Repeated falls: Secondary | ICD-10-CM | POA: Diagnosis not present

## 2023-04-21 DIAGNOSIS — Z6820 Body mass index (BMI) 20.0-20.9, adult: Secondary | ICD-10-CM | POA: Diagnosis not present

## 2023-04-21 DIAGNOSIS — F0284 Dementia in other diseases classified elsewhere, unspecified severity, with anxiety: Secondary | ICD-10-CM | POA: Diagnosis not present

## 2023-04-21 DIAGNOSIS — F0283 Dementia in other diseases classified elsewhere, unspecified severity, with mood disturbance: Secondary | ICD-10-CM | POA: Diagnosis not present

## 2023-04-21 DIAGNOSIS — G311 Senile degeneration of brain, not elsewhere classified: Secondary | ICD-10-CM | POA: Diagnosis not present

## 2023-04-21 DIAGNOSIS — E43 Unspecified severe protein-calorie malnutrition: Secondary | ICD-10-CM | POA: Diagnosis not present

## 2023-04-21 DIAGNOSIS — R296 Repeated falls: Secondary | ICD-10-CM | POA: Diagnosis not present

## 2023-04-22 DIAGNOSIS — F0284 Dementia in other diseases classified elsewhere, unspecified severity, with anxiety: Secondary | ICD-10-CM | POA: Diagnosis not present

## 2023-04-22 DIAGNOSIS — Z6820 Body mass index (BMI) 20.0-20.9, adult: Secondary | ICD-10-CM | POA: Diagnosis not present

## 2023-04-22 DIAGNOSIS — F0283 Dementia in other diseases classified elsewhere, unspecified severity, with mood disturbance: Secondary | ICD-10-CM | POA: Diagnosis not present

## 2023-04-22 DIAGNOSIS — E43 Unspecified severe protein-calorie malnutrition: Secondary | ICD-10-CM | POA: Diagnosis not present

## 2023-04-22 DIAGNOSIS — R296 Repeated falls: Secondary | ICD-10-CM | POA: Diagnosis not present

## 2023-04-22 DIAGNOSIS — G311 Senile degeneration of brain, not elsewhere classified: Secondary | ICD-10-CM | POA: Diagnosis not present

## 2023-04-23 DIAGNOSIS — G311 Senile degeneration of brain, not elsewhere classified: Secondary | ICD-10-CM | POA: Diagnosis not present

## 2023-04-23 DIAGNOSIS — R296 Repeated falls: Secondary | ICD-10-CM | POA: Diagnosis not present

## 2023-04-23 DIAGNOSIS — E43 Unspecified severe protein-calorie malnutrition: Secondary | ICD-10-CM | POA: Diagnosis not present

## 2023-04-23 DIAGNOSIS — F0283 Dementia in other diseases classified elsewhere, unspecified severity, with mood disturbance: Secondary | ICD-10-CM | POA: Diagnosis not present

## 2023-04-23 DIAGNOSIS — Z6820 Body mass index (BMI) 20.0-20.9, adult: Secondary | ICD-10-CM | POA: Diagnosis not present

## 2023-04-23 DIAGNOSIS — F0284 Dementia in other diseases classified elsewhere, unspecified severity, with anxiety: Secondary | ICD-10-CM | POA: Diagnosis not present

## 2023-04-24 DIAGNOSIS — G311 Senile degeneration of brain, not elsewhere classified: Secondary | ICD-10-CM | POA: Diagnosis not present

## 2023-04-24 DIAGNOSIS — F0284 Dementia in other diseases classified elsewhere, unspecified severity, with anxiety: Secondary | ICD-10-CM | POA: Diagnosis not present

## 2023-04-24 DIAGNOSIS — F0283 Dementia in other diseases classified elsewhere, unspecified severity, with mood disturbance: Secondary | ICD-10-CM | POA: Diagnosis not present

## 2023-04-24 DIAGNOSIS — R296 Repeated falls: Secondary | ICD-10-CM | POA: Diagnosis not present

## 2023-04-24 DIAGNOSIS — E43 Unspecified severe protein-calorie malnutrition: Secondary | ICD-10-CM | POA: Diagnosis not present

## 2023-04-24 DIAGNOSIS — Z6820 Body mass index (BMI) 20.0-20.9, adult: Secondary | ICD-10-CM | POA: Diagnosis not present

## 2023-04-27 DIAGNOSIS — G311 Senile degeneration of brain, not elsewhere classified: Secondary | ICD-10-CM | POA: Diagnosis not present

## 2023-04-27 DIAGNOSIS — F0283 Dementia in other diseases classified elsewhere, unspecified severity, with mood disturbance: Secondary | ICD-10-CM | POA: Diagnosis not present

## 2023-04-27 DIAGNOSIS — R296 Repeated falls: Secondary | ICD-10-CM | POA: Diagnosis not present

## 2023-04-27 DIAGNOSIS — Z6820 Body mass index (BMI) 20.0-20.9, adult: Secondary | ICD-10-CM | POA: Diagnosis not present

## 2023-04-27 DIAGNOSIS — F0284 Dementia in other diseases classified elsewhere, unspecified severity, with anxiety: Secondary | ICD-10-CM | POA: Diagnosis not present

## 2023-04-27 DIAGNOSIS — E43 Unspecified severe protein-calorie malnutrition: Secondary | ICD-10-CM | POA: Diagnosis not present

## 2023-04-28 DIAGNOSIS — G311 Senile degeneration of brain, not elsewhere classified: Secondary | ICD-10-CM | POA: Diagnosis not present

## 2023-04-28 DIAGNOSIS — F0283 Dementia in other diseases classified elsewhere, unspecified severity, with mood disturbance: Secondary | ICD-10-CM | POA: Diagnosis not present

## 2023-04-28 DIAGNOSIS — R296 Repeated falls: Secondary | ICD-10-CM | POA: Diagnosis not present

## 2023-04-28 DIAGNOSIS — Z6820 Body mass index (BMI) 20.0-20.9, adult: Secondary | ICD-10-CM | POA: Diagnosis not present

## 2023-04-28 DIAGNOSIS — E43 Unspecified severe protein-calorie malnutrition: Secondary | ICD-10-CM | POA: Diagnosis not present

## 2023-04-28 DIAGNOSIS — F0284 Dementia in other diseases classified elsewhere, unspecified severity, with anxiety: Secondary | ICD-10-CM | POA: Diagnosis not present

## 2023-04-29 DIAGNOSIS — Z6820 Body mass index (BMI) 20.0-20.9, adult: Secondary | ICD-10-CM | POA: Diagnosis not present

## 2023-04-29 DIAGNOSIS — F0284 Dementia in other diseases classified elsewhere, unspecified severity, with anxiety: Secondary | ICD-10-CM | POA: Diagnosis not present

## 2023-04-29 DIAGNOSIS — G311 Senile degeneration of brain, not elsewhere classified: Secondary | ICD-10-CM | POA: Diagnosis not present

## 2023-04-29 DIAGNOSIS — E43 Unspecified severe protein-calorie malnutrition: Secondary | ICD-10-CM | POA: Diagnosis not present

## 2023-04-29 DIAGNOSIS — R296 Repeated falls: Secondary | ICD-10-CM | POA: Diagnosis not present

## 2023-04-29 DIAGNOSIS — F0283 Dementia in other diseases classified elsewhere, unspecified severity, with mood disturbance: Secondary | ICD-10-CM | POA: Diagnosis not present

## 2023-04-30 DIAGNOSIS — R21 Rash and other nonspecific skin eruption: Secondary | ICD-10-CM | POA: Diagnosis not present

## 2023-04-30 DIAGNOSIS — I10 Essential (primary) hypertension: Secondary | ICD-10-CM | POA: Diagnosis not present

## 2023-04-30 DIAGNOSIS — I482 Chronic atrial fibrillation, unspecified: Secondary | ICD-10-CM | POA: Diagnosis not present

## 2023-04-30 DIAGNOSIS — F0390 Unspecified dementia without behavioral disturbance: Secondary | ICD-10-CM | POA: Diagnosis not present

## 2023-04-30 DIAGNOSIS — M150 Primary generalized (osteo)arthritis: Secondary | ICD-10-CM | POA: Diagnosis not present

## 2023-05-01 DIAGNOSIS — F0284 Dementia in other diseases classified elsewhere, unspecified severity, with anxiety: Secondary | ICD-10-CM | POA: Diagnosis not present

## 2023-05-01 DIAGNOSIS — F0283 Dementia in other diseases classified elsewhere, unspecified severity, with mood disturbance: Secondary | ICD-10-CM | POA: Diagnosis not present

## 2023-05-01 DIAGNOSIS — E43 Unspecified severe protein-calorie malnutrition: Secondary | ICD-10-CM | POA: Diagnosis not present

## 2023-05-01 DIAGNOSIS — G311 Senile degeneration of brain, not elsewhere classified: Secondary | ICD-10-CM | POA: Diagnosis not present

## 2023-05-01 DIAGNOSIS — R296 Repeated falls: Secondary | ICD-10-CM | POA: Diagnosis not present

## 2023-05-01 DIAGNOSIS — Z6820 Body mass index (BMI) 20.0-20.9, adult: Secondary | ICD-10-CM | POA: Diagnosis not present

## 2023-05-02 DIAGNOSIS — F0283 Dementia in other diseases classified elsewhere, unspecified severity, with mood disturbance: Secondary | ICD-10-CM | POA: Diagnosis not present

## 2023-05-02 DIAGNOSIS — R296 Repeated falls: Secondary | ICD-10-CM | POA: Diagnosis not present

## 2023-05-02 DIAGNOSIS — G311 Senile degeneration of brain, not elsewhere classified: Secondary | ICD-10-CM | POA: Diagnosis not present

## 2023-05-02 DIAGNOSIS — Z6820 Body mass index (BMI) 20.0-20.9, adult: Secondary | ICD-10-CM | POA: Diagnosis not present

## 2023-05-02 DIAGNOSIS — E43 Unspecified severe protein-calorie malnutrition: Secondary | ICD-10-CM | POA: Diagnosis not present

## 2023-05-02 DIAGNOSIS — F0284 Dementia in other diseases classified elsewhere, unspecified severity, with anxiety: Secondary | ICD-10-CM | POA: Diagnosis not present

## 2023-05-04 DIAGNOSIS — F0284 Dementia in other diseases classified elsewhere, unspecified severity, with anxiety: Secondary | ICD-10-CM | POA: Diagnosis not present

## 2023-05-04 DIAGNOSIS — Z6820 Body mass index (BMI) 20.0-20.9, adult: Secondary | ICD-10-CM | POA: Diagnosis not present

## 2023-05-04 DIAGNOSIS — R296 Repeated falls: Secondary | ICD-10-CM | POA: Diagnosis not present

## 2023-05-04 DIAGNOSIS — G311 Senile degeneration of brain, not elsewhere classified: Secondary | ICD-10-CM | POA: Diagnosis not present

## 2023-05-04 DIAGNOSIS — E43 Unspecified severe protein-calorie malnutrition: Secondary | ICD-10-CM | POA: Diagnosis not present

## 2023-05-04 DIAGNOSIS — F0283 Dementia in other diseases classified elsewhere, unspecified severity, with mood disturbance: Secondary | ICD-10-CM | POA: Diagnosis not present

## 2023-05-05 DIAGNOSIS — E43 Unspecified severe protein-calorie malnutrition: Secondary | ICD-10-CM | POA: Diagnosis not present

## 2023-05-05 DIAGNOSIS — F0284 Dementia in other diseases classified elsewhere, unspecified severity, with anxiety: Secondary | ICD-10-CM | POA: Diagnosis not present

## 2023-05-05 DIAGNOSIS — F0283 Dementia in other diseases classified elsewhere, unspecified severity, with mood disturbance: Secondary | ICD-10-CM | POA: Diagnosis not present

## 2023-05-05 DIAGNOSIS — R296 Repeated falls: Secondary | ICD-10-CM | POA: Diagnosis not present

## 2023-05-05 DIAGNOSIS — Z6820 Body mass index (BMI) 20.0-20.9, adult: Secondary | ICD-10-CM | POA: Diagnosis not present

## 2023-05-05 DIAGNOSIS — G311 Senile degeneration of brain, not elsewhere classified: Secondary | ICD-10-CM | POA: Diagnosis not present

## 2023-05-06 DIAGNOSIS — R296 Repeated falls: Secondary | ICD-10-CM | POA: Diagnosis not present

## 2023-05-06 DIAGNOSIS — F0284 Dementia in other diseases classified elsewhere, unspecified severity, with anxiety: Secondary | ICD-10-CM | POA: Diagnosis not present

## 2023-05-06 DIAGNOSIS — E43 Unspecified severe protein-calorie malnutrition: Secondary | ICD-10-CM | POA: Diagnosis not present

## 2023-05-06 DIAGNOSIS — Z6820 Body mass index (BMI) 20.0-20.9, adult: Secondary | ICD-10-CM | POA: Diagnosis not present

## 2023-05-06 DIAGNOSIS — F0283 Dementia in other diseases classified elsewhere, unspecified severity, with mood disturbance: Secondary | ICD-10-CM | POA: Diagnosis not present

## 2023-05-06 DIAGNOSIS — G311 Senile degeneration of brain, not elsewhere classified: Secondary | ICD-10-CM | POA: Diagnosis not present

## 2023-05-08 DIAGNOSIS — Z6820 Body mass index (BMI) 20.0-20.9, adult: Secondary | ICD-10-CM | POA: Diagnosis not present

## 2023-05-08 DIAGNOSIS — R296 Repeated falls: Secondary | ICD-10-CM | POA: Diagnosis not present

## 2023-05-08 DIAGNOSIS — F0283 Dementia in other diseases classified elsewhere, unspecified severity, with mood disturbance: Secondary | ICD-10-CM | POA: Diagnosis not present

## 2023-05-08 DIAGNOSIS — F0284 Dementia in other diseases classified elsewhere, unspecified severity, with anxiety: Secondary | ICD-10-CM | POA: Diagnosis not present

## 2023-05-08 DIAGNOSIS — G311 Senile degeneration of brain, not elsewhere classified: Secondary | ICD-10-CM | POA: Diagnosis not present

## 2023-05-08 DIAGNOSIS — E43 Unspecified severe protein-calorie malnutrition: Secondary | ICD-10-CM | POA: Diagnosis not present

## 2023-05-09 DIAGNOSIS — F0280 Dementia in other diseases classified elsewhere without behavioral disturbance: Secondary | ICD-10-CM | POA: Diagnosis not present

## 2023-05-09 DIAGNOSIS — F329 Major depressive disorder, single episode, unspecified: Secondary | ICD-10-CM | POA: Diagnosis not present

## 2023-05-09 DIAGNOSIS — I480 Paroxysmal atrial fibrillation: Secondary | ICD-10-CM | POA: Diagnosis not present

## 2023-05-09 DIAGNOSIS — I10 Essential (primary) hypertension: Secondary | ICD-10-CM | POA: Diagnosis not present

## 2023-05-09 DIAGNOSIS — G4089 Other seizures: Secondary | ICD-10-CM | POA: Diagnosis not present

## 2023-05-09 DIAGNOSIS — K5909 Other constipation: Secondary | ICD-10-CM | POA: Diagnosis not present

## 2023-05-09 DIAGNOSIS — K219 Gastro-esophageal reflux disease without esophagitis: Secondary | ICD-10-CM | POA: Diagnosis not present

## 2023-05-09 DIAGNOSIS — M150 Primary generalized (osteo)arthritis: Secondary | ICD-10-CM | POA: Diagnosis not present

## 2023-05-09 DIAGNOSIS — R251 Tremor, unspecified: Secondary | ICD-10-CM | POA: Diagnosis not present

## 2023-05-11 DIAGNOSIS — F0283 Dementia in other diseases classified elsewhere, unspecified severity, with mood disturbance: Secondary | ICD-10-CM | POA: Diagnosis not present

## 2023-05-11 DIAGNOSIS — F0284 Dementia in other diseases classified elsewhere, unspecified severity, with anxiety: Secondary | ICD-10-CM | POA: Diagnosis not present

## 2023-05-11 DIAGNOSIS — R159 Full incontinence of feces: Secondary | ICD-10-CM | POA: Diagnosis not present

## 2023-05-11 DIAGNOSIS — N1831 Chronic kidney disease, stage 3a: Secondary | ICD-10-CM | POA: Diagnosis not present

## 2023-05-11 DIAGNOSIS — G311 Senile degeneration of brain, not elsewhere classified: Secondary | ICD-10-CM | POA: Diagnosis not present

## 2023-05-11 DIAGNOSIS — I129 Hypertensive chronic kidney disease with stage 1 through stage 4 chronic kidney disease, or unspecified chronic kidney disease: Secondary | ICD-10-CM | POA: Diagnosis not present

## 2023-05-11 DIAGNOSIS — M810 Age-related osteoporosis without current pathological fracture: Secondary | ICD-10-CM | POA: Diagnosis not present

## 2023-05-11 DIAGNOSIS — E785 Hyperlipidemia, unspecified: Secondary | ICD-10-CM | POA: Diagnosis not present

## 2023-05-11 DIAGNOSIS — Z87440 Personal history of urinary (tract) infections: Secondary | ICD-10-CM | POA: Diagnosis not present

## 2023-05-11 DIAGNOSIS — H409 Unspecified glaucoma: Secondary | ICD-10-CM | POA: Diagnosis not present

## 2023-05-11 DIAGNOSIS — E538 Deficiency of other specified B group vitamins: Secondary | ICD-10-CM | POA: Diagnosis not present

## 2023-05-11 DIAGNOSIS — R32 Unspecified urinary incontinence: Secondary | ICD-10-CM | POA: Diagnosis not present

## 2023-05-11 DIAGNOSIS — G40909 Epilepsy, unspecified, not intractable, without status epilepticus: Secondary | ICD-10-CM | POA: Diagnosis not present

## 2023-05-11 DIAGNOSIS — Z741 Need for assistance with personal care: Secondary | ICD-10-CM | POA: Diagnosis not present

## 2023-05-11 DIAGNOSIS — E43 Unspecified severe protein-calorie malnutrition: Secondary | ICD-10-CM | POA: Diagnosis not present

## 2023-05-11 DIAGNOSIS — L299 Pruritus, unspecified: Secondary | ICD-10-CM | POA: Diagnosis not present

## 2023-05-11 DIAGNOSIS — R296 Repeated falls: Secondary | ICD-10-CM | POA: Diagnosis not present

## 2023-05-11 DIAGNOSIS — K219 Gastro-esophageal reflux disease without esophagitis: Secondary | ICD-10-CM | POA: Diagnosis not present

## 2023-05-11 DIAGNOSIS — Z6820 Body mass index (BMI) 20.0-20.9, adult: Secondary | ICD-10-CM | POA: Diagnosis not present

## 2023-05-11 DIAGNOSIS — Z8719 Personal history of other diseases of the digestive system: Secondary | ICD-10-CM | POA: Diagnosis not present

## 2023-05-11 DIAGNOSIS — D631 Anemia in chronic kidney disease: Secondary | ICD-10-CM | POA: Diagnosis not present

## 2023-05-11 DIAGNOSIS — I4891 Unspecified atrial fibrillation: Secondary | ICD-10-CM | POA: Diagnosis not present

## 2023-05-12 DIAGNOSIS — E43 Unspecified severe protein-calorie malnutrition: Secondary | ICD-10-CM | POA: Diagnosis not present

## 2023-05-12 DIAGNOSIS — R296 Repeated falls: Secondary | ICD-10-CM | POA: Diagnosis not present

## 2023-05-12 DIAGNOSIS — F0284 Dementia in other diseases classified elsewhere, unspecified severity, with anxiety: Secondary | ICD-10-CM | POA: Diagnosis not present

## 2023-05-12 DIAGNOSIS — G311 Senile degeneration of brain, not elsewhere classified: Secondary | ICD-10-CM | POA: Diagnosis not present

## 2023-05-12 DIAGNOSIS — F0283 Dementia in other diseases classified elsewhere, unspecified severity, with mood disturbance: Secondary | ICD-10-CM | POA: Diagnosis not present

## 2023-05-12 DIAGNOSIS — Z6820 Body mass index (BMI) 20.0-20.9, adult: Secondary | ICD-10-CM | POA: Diagnosis not present

## 2023-05-13 DIAGNOSIS — R296 Repeated falls: Secondary | ICD-10-CM | POA: Diagnosis not present

## 2023-05-13 DIAGNOSIS — E43 Unspecified severe protein-calorie malnutrition: Secondary | ICD-10-CM | POA: Diagnosis not present

## 2023-05-13 DIAGNOSIS — F0283 Dementia in other diseases classified elsewhere, unspecified severity, with mood disturbance: Secondary | ICD-10-CM | POA: Diagnosis not present

## 2023-05-13 DIAGNOSIS — F0284 Dementia in other diseases classified elsewhere, unspecified severity, with anxiety: Secondary | ICD-10-CM | POA: Diagnosis not present

## 2023-05-13 DIAGNOSIS — Z6820 Body mass index (BMI) 20.0-20.9, adult: Secondary | ICD-10-CM | POA: Diagnosis not present

## 2023-05-13 DIAGNOSIS — G311 Senile degeneration of brain, not elsewhere classified: Secondary | ICD-10-CM | POA: Diagnosis not present

## 2023-05-15 DIAGNOSIS — Z6820 Body mass index (BMI) 20.0-20.9, adult: Secondary | ICD-10-CM | POA: Diagnosis not present

## 2023-05-15 DIAGNOSIS — R296 Repeated falls: Secondary | ICD-10-CM | POA: Diagnosis not present

## 2023-05-15 DIAGNOSIS — E43 Unspecified severe protein-calorie malnutrition: Secondary | ICD-10-CM | POA: Diagnosis not present

## 2023-05-15 DIAGNOSIS — G311 Senile degeneration of brain, not elsewhere classified: Secondary | ICD-10-CM | POA: Diagnosis not present

## 2023-05-15 DIAGNOSIS — F0283 Dementia in other diseases classified elsewhere, unspecified severity, with mood disturbance: Secondary | ICD-10-CM | POA: Diagnosis not present

## 2023-05-15 DIAGNOSIS — F0284 Dementia in other diseases classified elsewhere, unspecified severity, with anxiety: Secondary | ICD-10-CM | POA: Diagnosis not present

## 2023-05-18 DIAGNOSIS — G311 Senile degeneration of brain, not elsewhere classified: Secondary | ICD-10-CM | POA: Diagnosis not present

## 2023-05-18 DIAGNOSIS — F0284 Dementia in other diseases classified elsewhere, unspecified severity, with anxiety: Secondary | ICD-10-CM | POA: Diagnosis not present

## 2023-05-18 DIAGNOSIS — Z6820 Body mass index (BMI) 20.0-20.9, adult: Secondary | ICD-10-CM | POA: Diagnosis not present

## 2023-05-18 DIAGNOSIS — R296 Repeated falls: Secondary | ICD-10-CM | POA: Diagnosis not present

## 2023-05-18 DIAGNOSIS — F0283 Dementia in other diseases classified elsewhere, unspecified severity, with mood disturbance: Secondary | ICD-10-CM | POA: Diagnosis not present

## 2023-05-18 DIAGNOSIS — E43 Unspecified severe protein-calorie malnutrition: Secondary | ICD-10-CM | POA: Diagnosis not present

## 2023-05-19 DIAGNOSIS — Z6820 Body mass index (BMI) 20.0-20.9, adult: Secondary | ICD-10-CM | POA: Diagnosis not present

## 2023-05-19 DIAGNOSIS — F0283 Dementia in other diseases classified elsewhere, unspecified severity, with mood disturbance: Secondary | ICD-10-CM | POA: Diagnosis not present

## 2023-05-19 DIAGNOSIS — G311 Senile degeneration of brain, not elsewhere classified: Secondary | ICD-10-CM | POA: Diagnosis not present

## 2023-05-19 DIAGNOSIS — E43 Unspecified severe protein-calorie malnutrition: Secondary | ICD-10-CM | POA: Diagnosis not present

## 2023-05-19 DIAGNOSIS — F0284 Dementia in other diseases classified elsewhere, unspecified severity, with anxiety: Secondary | ICD-10-CM | POA: Diagnosis not present

## 2023-05-19 DIAGNOSIS — R296 Repeated falls: Secondary | ICD-10-CM | POA: Diagnosis not present

## 2023-05-20 DIAGNOSIS — G311 Senile degeneration of brain, not elsewhere classified: Secondary | ICD-10-CM | POA: Diagnosis not present

## 2023-05-20 DIAGNOSIS — F0283 Dementia in other diseases classified elsewhere, unspecified severity, with mood disturbance: Secondary | ICD-10-CM | POA: Diagnosis not present

## 2023-05-20 DIAGNOSIS — R296 Repeated falls: Secondary | ICD-10-CM | POA: Diagnosis not present

## 2023-05-20 DIAGNOSIS — E43 Unspecified severe protein-calorie malnutrition: Secondary | ICD-10-CM | POA: Diagnosis not present

## 2023-05-20 DIAGNOSIS — Z6820 Body mass index (BMI) 20.0-20.9, adult: Secondary | ICD-10-CM | POA: Diagnosis not present

## 2023-05-20 DIAGNOSIS — F0284 Dementia in other diseases classified elsewhere, unspecified severity, with anxiety: Secondary | ICD-10-CM | POA: Diagnosis not present

## 2023-05-22 DIAGNOSIS — Z6820 Body mass index (BMI) 20.0-20.9, adult: Secondary | ICD-10-CM | POA: Diagnosis not present

## 2023-05-22 DIAGNOSIS — G311 Senile degeneration of brain, not elsewhere classified: Secondary | ICD-10-CM | POA: Diagnosis not present

## 2023-05-22 DIAGNOSIS — F0283 Dementia in other diseases classified elsewhere, unspecified severity, with mood disturbance: Secondary | ICD-10-CM | POA: Diagnosis not present

## 2023-05-22 DIAGNOSIS — R296 Repeated falls: Secondary | ICD-10-CM | POA: Diagnosis not present

## 2023-05-22 DIAGNOSIS — F0284 Dementia in other diseases classified elsewhere, unspecified severity, with anxiety: Secondary | ICD-10-CM | POA: Diagnosis not present

## 2023-05-22 DIAGNOSIS — E43 Unspecified severe protein-calorie malnutrition: Secondary | ICD-10-CM | POA: Diagnosis not present

## 2023-05-25 DIAGNOSIS — Z6820 Body mass index (BMI) 20.0-20.9, adult: Secondary | ICD-10-CM | POA: Diagnosis not present

## 2023-05-25 DIAGNOSIS — G311 Senile degeneration of brain, not elsewhere classified: Secondary | ICD-10-CM | POA: Diagnosis not present

## 2023-05-25 DIAGNOSIS — F0284 Dementia in other diseases classified elsewhere, unspecified severity, with anxiety: Secondary | ICD-10-CM | POA: Diagnosis not present

## 2023-05-25 DIAGNOSIS — R296 Repeated falls: Secondary | ICD-10-CM | POA: Diagnosis not present

## 2023-05-25 DIAGNOSIS — F0283 Dementia in other diseases classified elsewhere, unspecified severity, with mood disturbance: Secondary | ICD-10-CM | POA: Diagnosis not present

## 2023-05-25 DIAGNOSIS — E43 Unspecified severe protein-calorie malnutrition: Secondary | ICD-10-CM | POA: Diagnosis not present

## 2023-05-27 DIAGNOSIS — G311 Senile degeneration of brain, not elsewhere classified: Secondary | ICD-10-CM | POA: Diagnosis not present

## 2023-05-27 DIAGNOSIS — Z6820 Body mass index (BMI) 20.0-20.9, adult: Secondary | ICD-10-CM | POA: Diagnosis not present

## 2023-05-27 DIAGNOSIS — R296 Repeated falls: Secondary | ICD-10-CM | POA: Diagnosis not present

## 2023-05-27 DIAGNOSIS — F0283 Dementia in other diseases classified elsewhere, unspecified severity, with mood disturbance: Secondary | ICD-10-CM | POA: Diagnosis not present

## 2023-05-27 DIAGNOSIS — E43 Unspecified severe protein-calorie malnutrition: Secondary | ICD-10-CM | POA: Diagnosis not present

## 2023-05-27 DIAGNOSIS — F0284 Dementia in other diseases classified elsewhere, unspecified severity, with anxiety: Secondary | ICD-10-CM | POA: Diagnosis not present

## 2023-05-29 DIAGNOSIS — G311 Senile degeneration of brain, not elsewhere classified: Secondary | ICD-10-CM | POA: Diagnosis not present

## 2023-05-29 DIAGNOSIS — E43 Unspecified severe protein-calorie malnutrition: Secondary | ICD-10-CM | POA: Diagnosis not present

## 2023-05-29 DIAGNOSIS — G40909 Epilepsy, unspecified, not intractable, without status epilepticus: Secondary | ICD-10-CM | POA: Diagnosis not present

## 2023-05-29 DIAGNOSIS — D649 Anemia, unspecified: Secondary | ICD-10-CM | POA: Diagnosis not present

## 2023-05-29 DIAGNOSIS — F0284 Dementia in other diseases classified elsewhere, unspecified severity, with anxiety: Secondary | ICD-10-CM | POA: Diagnosis not present

## 2023-05-29 DIAGNOSIS — R296 Repeated falls: Secondary | ICD-10-CM | POA: Diagnosis not present

## 2023-05-29 DIAGNOSIS — F0283 Dementia in other diseases classified elsewhere, unspecified severity, with mood disturbance: Secondary | ICD-10-CM | POA: Diagnosis not present

## 2023-05-29 DIAGNOSIS — G9340 Encephalopathy, unspecified: Secondary | ICD-10-CM | POA: Diagnosis not present

## 2023-05-29 DIAGNOSIS — Z6820 Body mass index (BMI) 20.0-20.9, adult: Secondary | ICD-10-CM | POA: Diagnosis not present

## 2023-05-29 DIAGNOSIS — F0390 Unspecified dementia without behavioral disturbance: Secondary | ICD-10-CM | POA: Diagnosis not present

## 2023-06-01 DIAGNOSIS — F0284 Dementia in other diseases classified elsewhere, unspecified severity, with anxiety: Secondary | ICD-10-CM | POA: Diagnosis not present

## 2023-06-01 DIAGNOSIS — R296 Repeated falls: Secondary | ICD-10-CM | POA: Diagnosis not present

## 2023-06-01 DIAGNOSIS — G311 Senile degeneration of brain, not elsewhere classified: Secondary | ICD-10-CM | POA: Diagnosis not present

## 2023-06-01 DIAGNOSIS — Z6820 Body mass index (BMI) 20.0-20.9, adult: Secondary | ICD-10-CM | POA: Diagnosis not present

## 2023-06-01 DIAGNOSIS — F0283 Dementia in other diseases classified elsewhere, unspecified severity, with mood disturbance: Secondary | ICD-10-CM | POA: Diagnosis not present

## 2023-06-01 DIAGNOSIS — E43 Unspecified severe protein-calorie malnutrition: Secondary | ICD-10-CM | POA: Diagnosis not present

## 2023-06-03 DIAGNOSIS — I482 Chronic atrial fibrillation, unspecified: Secondary | ICD-10-CM | POA: Diagnosis not present

## 2023-06-03 DIAGNOSIS — E43 Unspecified severe protein-calorie malnutrition: Secondary | ICD-10-CM | POA: Diagnosis not present

## 2023-06-03 DIAGNOSIS — R21 Rash and other nonspecific skin eruption: Secondary | ICD-10-CM | POA: Diagnosis not present

## 2023-06-03 DIAGNOSIS — K219 Gastro-esophageal reflux disease without esophagitis: Secondary | ICD-10-CM | POA: Diagnosis not present

## 2023-06-03 DIAGNOSIS — F0283 Dementia in other diseases classified elsewhere, unspecified severity, with mood disturbance: Secondary | ICD-10-CM | POA: Diagnosis not present

## 2023-06-03 DIAGNOSIS — Z6820 Body mass index (BMI) 20.0-20.9, adult: Secondary | ICD-10-CM | POA: Diagnosis not present

## 2023-06-03 DIAGNOSIS — R296 Repeated falls: Secondary | ICD-10-CM | POA: Diagnosis not present

## 2023-06-03 DIAGNOSIS — F0284 Dementia in other diseases classified elsewhere, unspecified severity, with anxiety: Secondary | ICD-10-CM | POA: Diagnosis not present

## 2023-06-03 DIAGNOSIS — F0390 Unspecified dementia without behavioral disturbance: Secondary | ICD-10-CM | POA: Diagnosis not present

## 2023-06-03 DIAGNOSIS — G311 Senile degeneration of brain, not elsewhere classified: Secondary | ICD-10-CM | POA: Diagnosis not present

## 2023-06-05 DIAGNOSIS — R296 Repeated falls: Secondary | ICD-10-CM | POA: Diagnosis not present

## 2023-06-05 DIAGNOSIS — Z6820 Body mass index (BMI) 20.0-20.9, adult: Secondary | ICD-10-CM | POA: Diagnosis not present

## 2023-06-05 DIAGNOSIS — E43 Unspecified severe protein-calorie malnutrition: Secondary | ICD-10-CM | POA: Diagnosis not present

## 2023-06-05 DIAGNOSIS — F0284 Dementia in other diseases classified elsewhere, unspecified severity, with anxiety: Secondary | ICD-10-CM | POA: Diagnosis not present

## 2023-06-05 DIAGNOSIS — F0283 Dementia in other diseases classified elsewhere, unspecified severity, with mood disturbance: Secondary | ICD-10-CM | POA: Diagnosis not present

## 2023-06-05 DIAGNOSIS — G311 Senile degeneration of brain, not elsewhere classified: Secondary | ICD-10-CM | POA: Diagnosis not present

## 2023-06-06 DIAGNOSIS — R296 Repeated falls: Secondary | ICD-10-CM | POA: Diagnosis not present

## 2023-06-06 DIAGNOSIS — F0283 Dementia in other diseases classified elsewhere, unspecified severity, with mood disturbance: Secondary | ICD-10-CM | POA: Diagnosis not present

## 2023-06-06 DIAGNOSIS — F0284 Dementia in other diseases classified elsewhere, unspecified severity, with anxiety: Secondary | ICD-10-CM | POA: Diagnosis not present

## 2023-06-06 DIAGNOSIS — Z6820 Body mass index (BMI) 20.0-20.9, adult: Secondary | ICD-10-CM | POA: Diagnosis not present

## 2023-06-06 DIAGNOSIS — E43 Unspecified severe protein-calorie malnutrition: Secondary | ICD-10-CM | POA: Diagnosis not present

## 2023-06-06 DIAGNOSIS — G311 Senile degeneration of brain, not elsewhere classified: Secondary | ICD-10-CM | POA: Diagnosis not present

## 2023-06-08 DIAGNOSIS — F0283 Dementia in other diseases classified elsewhere, unspecified severity, with mood disturbance: Secondary | ICD-10-CM | POA: Diagnosis not present

## 2023-06-08 DIAGNOSIS — G311 Senile degeneration of brain, not elsewhere classified: Secondary | ICD-10-CM | POA: Diagnosis not present

## 2023-06-08 DIAGNOSIS — Z6820 Body mass index (BMI) 20.0-20.9, adult: Secondary | ICD-10-CM | POA: Diagnosis not present

## 2023-06-08 DIAGNOSIS — F0284 Dementia in other diseases classified elsewhere, unspecified severity, with anxiety: Secondary | ICD-10-CM | POA: Diagnosis not present

## 2023-06-08 DIAGNOSIS — R296 Repeated falls: Secondary | ICD-10-CM | POA: Diagnosis not present

## 2023-06-08 DIAGNOSIS — E43 Unspecified severe protein-calorie malnutrition: Secondary | ICD-10-CM | POA: Diagnosis not present

## 2023-06-10 DIAGNOSIS — F0283 Dementia in other diseases classified elsewhere, unspecified severity, with mood disturbance: Secondary | ICD-10-CM | POA: Diagnosis not present

## 2023-06-10 DIAGNOSIS — R296 Repeated falls: Secondary | ICD-10-CM | POA: Diagnosis not present

## 2023-06-10 DIAGNOSIS — F0284 Dementia in other diseases classified elsewhere, unspecified severity, with anxiety: Secondary | ICD-10-CM | POA: Diagnosis not present

## 2023-06-10 DIAGNOSIS — G311 Senile degeneration of brain, not elsewhere classified: Secondary | ICD-10-CM | POA: Diagnosis not present

## 2023-06-10 DIAGNOSIS — E43 Unspecified severe protein-calorie malnutrition: Secondary | ICD-10-CM | POA: Diagnosis not present

## 2023-06-10 DIAGNOSIS — Z6820 Body mass index (BMI) 20.0-20.9, adult: Secondary | ICD-10-CM | POA: Diagnosis not present

## 2023-06-11 DIAGNOSIS — G40909 Epilepsy, unspecified, not intractable, without status epilepticus: Secondary | ICD-10-CM | POA: Diagnosis not present

## 2023-06-11 DIAGNOSIS — N1831 Chronic kidney disease, stage 3a: Secondary | ICD-10-CM | POA: Diagnosis not present

## 2023-06-11 DIAGNOSIS — F0284 Dementia in other diseases classified elsewhere, unspecified severity, with anxiety: Secondary | ICD-10-CM | POA: Diagnosis not present

## 2023-06-11 DIAGNOSIS — E43 Unspecified severe protein-calorie malnutrition: Secondary | ICD-10-CM | POA: Diagnosis not present

## 2023-06-11 DIAGNOSIS — D631 Anemia in chronic kidney disease: Secondary | ICD-10-CM | POA: Diagnosis not present

## 2023-06-11 DIAGNOSIS — I129 Hypertensive chronic kidney disease with stage 1 through stage 4 chronic kidney disease, or unspecified chronic kidney disease: Secondary | ICD-10-CM | POA: Diagnosis not present

## 2023-06-11 DIAGNOSIS — G311 Senile degeneration of brain, not elsewhere classified: Secondary | ICD-10-CM | POA: Diagnosis not present

## 2023-06-11 DIAGNOSIS — Z8719 Personal history of other diseases of the digestive system: Secondary | ICD-10-CM | POA: Diagnosis not present

## 2023-06-11 DIAGNOSIS — R159 Full incontinence of feces: Secondary | ICD-10-CM | POA: Diagnosis not present

## 2023-06-11 DIAGNOSIS — E785 Hyperlipidemia, unspecified: Secondary | ICD-10-CM | POA: Diagnosis not present

## 2023-06-11 DIAGNOSIS — E538 Deficiency of other specified B group vitamins: Secondary | ICD-10-CM | POA: Diagnosis not present

## 2023-06-11 DIAGNOSIS — M810 Age-related osteoporosis without current pathological fracture: Secondary | ICD-10-CM | POA: Diagnosis not present

## 2023-06-11 DIAGNOSIS — Z741 Need for assistance with personal care: Secondary | ICD-10-CM | POA: Diagnosis not present

## 2023-06-11 DIAGNOSIS — L299 Pruritus, unspecified: Secondary | ICD-10-CM | POA: Diagnosis not present

## 2023-06-11 DIAGNOSIS — F0283 Dementia in other diseases classified elsewhere, unspecified severity, with mood disturbance: Secondary | ICD-10-CM | POA: Diagnosis not present

## 2023-06-11 DIAGNOSIS — K219 Gastro-esophageal reflux disease without esophagitis: Secondary | ICD-10-CM | POA: Diagnosis not present

## 2023-06-11 DIAGNOSIS — Z87440 Personal history of urinary (tract) infections: Secondary | ICD-10-CM | POA: Diagnosis not present

## 2023-06-11 DIAGNOSIS — R32 Unspecified urinary incontinence: Secondary | ICD-10-CM | POA: Diagnosis not present

## 2023-06-11 DIAGNOSIS — Z6820 Body mass index (BMI) 20.0-20.9, adult: Secondary | ICD-10-CM | POA: Diagnosis not present

## 2023-06-11 DIAGNOSIS — H409 Unspecified glaucoma: Secondary | ICD-10-CM | POA: Diagnosis not present

## 2023-06-11 DIAGNOSIS — R296 Repeated falls: Secondary | ICD-10-CM | POA: Diagnosis not present

## 2023-06-11 DIAGNOSIS — I4891 Unspecified atrial fibrillation: Secondary | ICD-10-CM | POA: Diagnosis not present

## 2023-06-12 DIAGNOSIS — G311 Senile degeneration of brain, not elsewhere classified: Secondary | ICD-10-CM | POA: Diagnosis not present

## 2023-06-12 DIAGNOSIS — F0284 Dementia in other diseases classified elsewhere, unspecified severity, with anxiety: Secondary | ICD-10-CM | POA: Diagnosis not present

## 2023-06-12 DIAGNOSIS — Z6820 Body mass index (BMI) 20.0-20.9, adult: Secondary | ICD-10-CM | POA: Diagnosis not present

## 2023-06-12 DIAGNOSIS — R296 Repeated falls: Secondary | ICD-10-CM | POA: Diagnosis not present

## 2023-06-12 DIAGNOSIS — E43 Unspecified severe protein-calorie malnutrition: Secondary | ICD-10-CM | POA: Diagnosis not present

## 2023-06-12 DIAGNOSIS — F0283 Dementia in other diseases classified elsewhere, unspecified severity, with mood disturbance: Secondary | ICD-10-CM | POA: Diagnosis not present

## 2023-06-15 DIAGNOSIS — F0284 Dementia in other diseases classified elsewhere, unspecified severity, with anxiety: Secondary | ICD-10-CM | POA: Diagnosis not present

## 2023-06-15 DIAGNOSIS — R296 Repeated falls: Secondary | ICD-10-CM | POA: Diagnosis not present

## 2023-06-15 DIAGNOSIS — G311 Senile degeneration of brain, not elsewhere classified: Secondary | ICD-10-CM | POA: Diagnosis not present

## 2023-06-15 DIAGNOSIS — Z6820 Body mass index (BMI) 20.0-20.9, adult: Secondary | ICD-10-CM | POA: Diagnosis not present

## 2023-06-15 DIAGNOSIS — F0283 Dementia in other diseases classified elsewhere, unspecified severity, with mood disturbance: Secondary | ICD-10-CM | POA: Diagnosis not present

## 2023-06-15 DIAGNOSIS — E43 Unspecified severe protein-calorie malnutrition: Secondary | ICD-10-CM | POA: Diagnosis not present

## 2023-06-17 DIAGNOSIS — F0283 Dementia in other diseases classified elsewhere, unspecified severity, with mood disturbance: Secondary | ICD-10-CM | POA: Diagnosis not present

## 2023-06-17 DIAGNOSIS — Z6820 Body mass index (BMI) 20.0-20.9, adult: Secondary | ICD-10-CM | POA: Diagnosis not present

## 2023-06-17 DIAGNOSIS — R296 Repeated falls: Secondary | ICD-10-CM | POA: Diagnosis not present

## 2023-06-17 DIAGNOSIS — F0284 Dementia in other diseases classified elsewhere, unspecified severity, with anxiety: Secondary | ICD-10-CM | POA: Diagnosis not present

## 2023-06-17 DIAGNOSIS — G311 Senile degeneration of brain, not elsewhere classified: Secondary | ICD-10-CM | POA: Diagnosis not present

## 2023-06-17 DIAGNOSIS — E43 Unspecified severe protein-calorie malnutrition: Secondary | ICD-10-CM | POA: Diagnosis not present

## 2023-06-18 DIAGNOSIS — I482 Chronic atrial fibrillation, unspecified: Secondary | ICD-10-CM | POA: Diagnosis not present

## 2023-06-18 DIAGNOSIS — K219 Gastro-esophageal reflux disease without esophagitis: Secondary | ICD-10-CM | POA: Diagnosis not present

## 2023-06-18 DIAGNOSIS — G4089 Other seizures: Secondary | ICD-10-CM | POA: Diagnosis not present

## 2023-06-18 DIAGNOSIS — F329 Major depressive disorder, single episode, unspecified: Secondary | ICD-10-CM | POA: Diagnosis not present

## 2023-06-19 DIAGNOSIS — G311 Senile degeneration of brain, not elsewhere classified: Secondary | ICD-10-CM | POA: Diagnosis not present

## 2023-06-19 DIAGNOSIS — F0283 Dementia in other diseases classified elsewhere, unspecified severity, with mood disturbance: Secondary | ICD-10-CM | POA: Diagnosis not present

## 2023-06-19 DIAGNOSIS — R296 Repeated falls: Secondary | ICD-10-CM | POA: Diagnosis not present

## 2023-06-19 DIAGNOSIS — Z6820 Body mass index (BMI) 20.0-20.9, adult: Secondary | ICD-10-CM | POA: Diagnosis not present

## 2023-06-19 DIAGNOSIS — E43 Unspecified severe protein-calorie malnutrition: Secondary | ICD-10-CM | POA: Diagnosis not present

## 2023-06-19 DIAGNOSIS — F0284 Dementia in other diseases classified elsewhere, unspecified severity, with anxiety: Secondary | ICD-10-CM | POA: Diagnosis not present

## 2023-06-21 DIAGNOSIS — F0280 Dementia in other diseases classified elsewhere without behavioral disturbance: Secondary | ICD-10-CM | POA: Diagnosis not present

## 2023-06-21 DIAGNOSIS — R251 Tremor, unspecified: Secondary | ICD-10-CM | POA: Diagnosis not present

## 2023-06-21 DIAGNOSIS — M150 Primary generalized (osteo)arthritis: Secondary | ICD-10-CM | POA: Diagnosis not present

## 2023-06-21 DIAGNOSIS — G4089 Other seizures: Secondary | ICD-10-CM | POA: Diagnosis not present

## 2023-06-21 DIAGNOSIS — K219 Gastro-esophageal reflux disease without esophagitis: Secondary | ICD-10-CM | POA: Diagnosis not present

## 2023-06-21 DIAGNOSIS — I480 Paroxysmal atrial fibrillation: Secondary | ICD-10-CM | POA: Diagnosis not present

## 2023-06-21 DIAGNOSIS — K5909 Other constipation: Secondary | ICD-10-CM | POA: Diagnosis not present

## 2023-06-21 DIAGNOSIS — F329 Major depressive disorder, single episode, unspecified: Secondary | ICD-10-CM | POA: Diagnosis not present

## 2023-06-22 DIAGNOSIS — R296 Repeated falls: Secondary | ICD-10-CM | POA: Diagnosis not present

## 2023-06-22 DIAGNOSIS — F0283 Dementia in other diseases classified elsewhere, unspecified severity, with mood disturbance: Secondary | ICD-10-CM | POA: Diagnosis not present

## 2023-06-22 DIAGNOSIS — G311 Senile degeneration of brain, not elsewhere classified: Secondary | ICD-10-CM | POA: Diagnosis not present

## 2023-06-22 DIAGNOSIS — Z6820 Body mass index (BMI) 20.0-20.9, adult: Secondary | ICD-10-CM | POA: Diagnosis not present

## 2023-06-22 DIAGNOSIS — E43 Unspecified severe protein-calorie malnutrition: Secondary | ICD-10-CM | POA: Diagnosis not present

## 2023-06-22 DIAGNOSIS — F0284 Dementia in other diseases classified elsewhere, unspecified severity, with anxiety: Secondary | ICD-10-CM | POA: Diagnosis not present

## 2023-06-24 DIAGNOSIS — F0283 Dementia in other diseases classified elsewhere, unspecified severity, with mood disturbance: Secondary | ICD-10-CM | POA: Diagnosis not present

## 2023-06-24 DIAGNOSIS — Z6820 Body mass index (BMI) 20.0-20.9, adult: Secondary | ICD-10-CM | POA: Diagnosis not present

## 2023-06-24 DIAGNOSIS — F0284 Dementia in other diseases classified elsewhere, unspecified severity, with anxiety: Secondary | ICD-10-CM | POA: Diagnosis not present

## 2023-06-24 DIAGNOSIS — R296 Repeated falls: Secondary | ICD-10-CM | POA: Diagnosis not present

## 2023-06-24 DIAGNOSIS — E43 Unspecified severe protein-calorie malnutrition: Secondary | ICD-10-CM | POA: Diagnosis not present

## 2023-06-24 DIAGNOSIS — G311 Senile degeneration of brain, not elsewhere classified: Secondary | ICD-10-CM | POA: Diagnosis not present

## 2023-06-25 DIAGNOSIS — F0390 Unspecified dementia without behavioral disturbance: Secondary | ICD-10-CM | POA: Diagnosis not present

## 2023-06-25 DIAGNOSIS — I482 Chronic atrial fibrillation, unspecified: Secondary | ICD-10-CM | POA: Diagnosis not present

## 2023-06-25 DIAGNOSIS — M150 Primary generalized (osteo)arthritis: Secondary | ICD-10-CM | POA: Diagnosis not present

## 2023-06-25 DIAGNOSIS — K219 Gastro-esophageal reflux disease without esophagitis: Secondary | ICD-10-CM | POA: Diagnosis not present

## 2023-06-26 DIAGNOSIS — R296 Repeated falls: Secondary | ICD-10-CM | POA: Diagnosis not present

## 2023-06-26 DIAGNOSIS — Z6820 Body mass index (BMI) 20.0-20.9, adult: Secondary | ICD-10-CM | POA: Diagnosis not present

## 2023-06-26 DIAGNOSIS — F0284 Dementia in other diseases classified elsewhere, unspecified severity, with anxiety: Secondary | ICD-10-CM | POA: Diagnosis not present

## 2023-06-26 DIAGNOSIS — F0283 Dementia in other diseases classified elsewhere, unspecified severity, with mood disturbance: Secondary | ICD-10-CM | POA: Diagnosis not present

## 2023-06-26 DIAGNOSIS — G311 Senile degeneration of brain, not elsewhere classified: Secondary | ICD-10-CM | POA: Diagnosis not present

## 2023-06-26 DIAGNOSIS — E43 Unspecified severe protein-calorie malnutrition: Secondary | ICD-10-CM | POA: Diagnosis not present

## 2023-06-29 DIAGNOSIS — E43 Unspecified severe protein-calorie malnutrition: Secondary | ICD-10-CM | POA: Diagnosis not present

## 2023-06-29 DIAGNOSIS — Z6820 Body mass index (BMI) 20.0-20.9, adult: Secondary | ICD-10-CM | POA: Diagnosis not present

## 2023-06-29 DIAGNOSIS — F0283 Dementia in other diseases classified elsewhere, unspecified severity, with mood disturbance: Secondary | ICD-10-CM | POA: Diagnosis not present

## 2023-06-29 DIAGNOSIS — R296 Repeated falls: Secondary | ICD-10-CM | POA: Diagnosis not present

## 2023-06-29 DIAGNOSIS — G311 Senile degeneration of brain, not elsewhere classified: Secondary | ICD-10-CM | POA: Diagnosis not present

## 2023-06-29 DIAGNOSIS — F0284 Dementia in other diseases classified elsewhere, unspecified severity, with anxiety: Secondary | ICD-10-CM | POA: Diagnosis not present

## 2023-07-01 DIAGNOSIS — F0284 Dementia in other diseases classified elsewhere, unspecified severity, with anxiety: Secondary | ICD-10-CM | POA: Diagnosis not present

## 2023-07-01 DIAGNOSIS — R296 Repeated falls: Secondary | ICD-10-CM | POA: Diagnosis not present

## 2023-07-01 DIAGNOSIS — E43 Unspecified severe protein-calorie malnutrition: Secondary | ICD-10-CM | POA: Diagnosis not present

## 2023-07-01 DIAGNOSIS — Z6820 Body mass index (BMI) 20.0-20.9, adult: Secondary | ICD-10-CM | POA: Diagnosis not present

## 2023-07-01 DIAGNOSIS — G311 Senile degeneration of brain, not elsewhere classified: Secondary | ICD-10-CM | POA: Diagnosis not present

## 2023-07-01 DIAGNOSIS — F0283 Dementia in other diseases classified elsewhere, unspecified severity, with mood disturbance: Secondary | ICD-10-CM | POA: Diagnosis not present

## 2023-07-03 DIAGNOSIS — G311 Senile degeneration of brain, not elsewhere classified: Secondary | ICD-10-CM | POA: Diagnosis not present

## 2023-07-03 DIAGNOSIS — F0284 Dementia in other diseases classified elsewhere, unspecified severity, with anxiety: Secondary | ICD-10-CM | POA: Diagnosis not present

## 2023-07-03 DIAGNOSIS — G9340 Encephalopathy, unspecified: Secondary | ICD-10-CM | POA: Diagnosis not present

## 2023-07-03 DIAGNOSIS — Z6820 Body mass index (BMI) 20.0-20.9, adult: Secondary | ICD-10-CM | POA: Diagnosis not present

## 2023-07-03 DIAGNOSIS — R296 Repeated falls: Secondary | ICD-10-CM | POA: Diagnosis not present

## 2023-07-03 DIAGNOSIS — F0390 Unspecified dementia without behavioral disturbance: Secondary | ICD-10-CM | POA: Diagnosis not present

## 2023-07-03 DIAGNOSIS — E43 Unspecified severe protein-calorie malnutrition: Secondary | ICD-10-CM | POA: Diagnosis not present

## 2023-07-03 DIAGNOSIS — G40909 Epilepsy, unspecified, not intractable, without status epilepticus: Secondary | ICD-10-CM | POA: Diagnosis not present

## 2023-07-03 DIAGNOSIS — D649 Anemia, unspecified: Secondary | ICD-10-CM | POA: Diagnosis not present

## 2023-07-03 DIAGNOSIS — F0283 Dementia in other diseases classified elsewhere, unspecified severity, with mood disturbance: Secondary | ICD-10-CM | POA: Diagnosis not present

## 2023-07-06 DIAGNOSIS — F0283 Dementia in other diseases classified elsewhere, unspecified severity, with mood disturbance: Secondary | ICD-10-CM | POA: Diagnosis not present

## 2023-07-06 DIAGNOSIS — F0284 Dementia in other diseases classified elsewhere, unspecified severity, with anxiety: Secondary | ICD-10-CM | POA: Diagnosis not present

## 2023-07-06 DIAGNOSIS — E43 Unspecified severe protein-calorie malnutrition: Secondary | ICD-10-CM | POA: Diagnosis not present

## 2023-07-06 DIAGNOSIS — Z6820 Body mass index (BMI) 20.0-20.9, adult: Secondary | ICD-10-CM | POA: Diagnosis not present

## 2023-07-06 DIAGNOSIS — R296 Repeated falls: Secondary | ICD-10-CM | POA: Diagnosis not present

## 2023-07-06 DIAGNOSIS — G311 Senile degeneration of brain, not elsewhere classified: Secondary | ICD-10-CM | POA: Diagnosis not present

## 2023-07-08 DIAGNOSIS — E43 Unspecified severe protein-calorie malnutrition: Secondary | ICD-10-CM | POA: Diagnosis not present

## 2023-07-08 DIAGNOSIS — Z6820 Body mass index (BMI) 20.0-20.9, adult: Secondary | ICD-10-CM | POA: Diagnosis not present

## 2023-07-08 DIAGNOSIS — F0283 Dementia in other diseases classified elsewhere, unspecified severity, with mood disturbance: Secondary | ICD-10-CM | POA: Diagnosis not present

## 2023-07-08 DIAGNOSIS — G311 Senile degeneration of brain, not elsewhere classified: Secondary | ICD-10-CM | POA: Diagnosis not present

## 2023-07-08 DIAGNOSIS — R296 Repeated falls: Secondary | ICD-10-CM | POA: Diagnosis not present

## 2023-07-08 DIAGNOSIS — F0284 Dementia in other diseases classified elsewhere, unspecified severity, with anxiety: Secondary | ICD-10-CM | POA: Diagnosis not present

## 2023-07-10 DIAGNOSIS — R296 Repeated falls: Secondary | ICD-10-CM | POA: Diagnosis not present

## 2023-07-10 DIAGNOSIS — F0283 Dementia in other diseases classified elsewhere, unspecified severity, with mood disturbance: Secondary | ICD-10-CM | POA: Diagnosis not present

## 2023-07-10 DIAGNOSIS — F0284 Dementia in other diseases classified elsewhere, unspecified severity, with anxiety: Secondary | ICD-10-CM | POA: Diagnosis not present

## 2023-07-10 DIAGNOSIS — G311 Senile degeneration of brain, not elsewhere classified: Secondary | ICD-10-CM | POA: Diagnosis not present

## 2023-07-10 DIAGNOSIS — E43 Unspecified severe protein-calorie malnutrition: Secondary | ICD-10-CM | POA: Diagnosis not present

## 2023-07-10 DIAGNOSIS — Z6820 Body mass index (BMI) 20.0-20.9, adult: Secondary | ICD-10-CM | POA: Diagnosis not present

## 2023-07-12 DIAGNOSIS — K219 Gastro-esophageal reflux disease without esophagitis: Secondary | ICD-10-CM | POA: Diagnosis not present

## 2023-07-12 DIAGNOSIS — Z87440 Personal history of urinary (tract) infections: Secondary | ICD-10-CM | POA: Diagnosis not present

## 2023-07-12 DIAGNOSIS — F329 Major depressive disorder, single episode, unspecified: Secondary | ICD-10-CM | POA: Diagnosis not present

## 2023-07-12 DIAGNOSIS — D631 Anemia in chronic kidney disease: Secondary | ICD-10-CM | POA: Diagnosis not present

## 2023-07-12 DIAGNOSIS — E43 Unspecified severe protein-calorie malnutrition: Secondary | ICD-10-CM | POA: Diagnosis not present

## 2023-07-12 DIAGNOSIS — I10 Essential (primary) hypertension: Secondary | ICD-10-CM | POA: Diagnosis not present

## 2023-07-12 DIAGNOSIS — F0280 Dementia in other diseases classified elsewhere without behavioral disturbance: Secondary | ICD-10-CM | POA: Diagnosis not present

## 2023-07-12 DIAGNOSIS — Z8719 Personal history of other diseases of the digestive system: Secondary | ICD-10-CM | POA: Diagnosis not present

## 2023-07-12 DIAGNOSIS — N1831 Chronic kidney disease, stage 3a: Secondary | ICD-10-CM | POA: Diagnosis not present

## 2023-07-12 DIAGNOSIS — M150 Primary generalized (osteo)arthritis: Secondary | ICD-10-CM | POA: Diagnosis not present

## 2023-07-12 DIAGNOSIS — I129 Hypertensive chronic kidney disease with stage 1 through stage 4 chronic kidney disease, or unspecified chronic kidney disease: Secondary | ICD-10-CM | POA: Diagnosis not present

## 2023-07-12 DIAGNOSIS — G4089 Other seizures: Secondary | ICD-10-CM | POA: Diagnosis not present

## 2023-07-12 DIAGNOSIS — R569 Unspecified convulsions: Secondary | ICD-10-CM | POA: Diagnosis not present

## 2023-07-12 DIAGNOSIS — R296 Repeated falls: Secondary | ICD-10-CM | POA: Diagnosis not present

## 2023-07-12 DIAGNOSIS — Z6820 Body mass index (BMI) 20.0-20.9, adult: Secondary | ICD-10-CM | POA: Diagnosis not present

## 2023-07-12 DIAGNOSIS — E785 Hyperlipidemia, unspecified: Secondary | ICD-10-CM | POA: Diagnosis not present

## 2023-07-12 DIAGNOSIS — I4891 Unspecified atrial fibrillation: Secondary | ICD-10-CM | POA: Diagnosis not present

## 2023-07-12 DIAGNOSIS — I482 Chronic atrial fibrillation, unspecified: Secondary | ICD-10-CM | POA: Diagnosis not present

## 2023-07-12 DIAGNOSIS — G311 Senile degeneration of brain, not elsewhere classified: Secondary | ICD-10-CM | POA: Diagnosis not present

## 2023-07-13 DIAGNOSIS — Z6820 Body mass index (BMI) 20.0-20.9, adult: Secondary | ICD-10-CM | POA: Diagnosis not present

## 2023-07-13 DIAGNOSIS — K219 Gastro-esophageal reflux disease without esophagitis: Secondary | ICD-10-CM | POA: Diagnosis not present

## 2023-07-13 DIAGNOSIS — G311 Senile degeneration of brain, not elsewhere classified: Secondary | ICD-10-CM | POA: Diagnosis not present

## 2023-07-13 DIAGNOSIS — E43 Unspecified severe protein-calorie malnutrition: Secondary | ICD-10-CM | POA: Diagnosis not present

## 2023-07-13 DIAGNOSIS — R296 Repeated falls: Secondary | ICD-10-CM | POA: Diagnosis not present

## 2023-07-13 DIAGNOSIS — Z8719 Personal history of other diseases of the digestive system: Secondary | ICD-10-CM | POA: Diagnosis not present

## 2023-07-14 DIAGNOSIS — F0390 Unspecified dementia without behavioral disturbance: Secondary | ICD-10-CM | POA: Diagnosis not present

## 2023-07-14 DIAGNOSIS — I482 Chronic atrial fibrillation, unspecified: Secondary | ICD-10-CM | POA: Diagnosis not present

## 2023-07-14 DIAGNOSIS — Z8719 Personal history of other diseases of the digestive system: Secondary | ICD-10-CM | POA: Diagnosis not present

## 2023-07-14 DIAGNOSIS — G4089 Other seizures: Secondary | ICD-10-CM | POA: Diagnosis not present

## 2023-07-14 DIAGNOSIS — R296 Repeated falls: Secondary | ICD-10-CM | POA: Diagnosis not present

## 2023-07-14 DIAGNOSIS — K219 Gastro-esophageal reflux disease without esophagitis: Secondary | ICD-10-CM | POA: Diagnosis not present

## 2023-07-14 DIAGNOSIS — E43 Unspecified severe protein-calorie malnutrition: Secondary | ICD-10-CM | POA: Diagnosis not present

## 2023-07-14 DIAGNOSIS — Z6820 Body mass index (BMI) 20.0-20.9, adult: Secondary | ICD-10-CM | POA: Diagnosis not present

## 2023-07-14 DIAGNOSIS — G311 Senile degeneration of brain, not elsewhere classified: Secondary | ICD-10-CM | POA: Diagnosis not present

## 2023-07-15 DIAGNOSIS — R296 Repeated falls: Secondary | ICD-10-CM | POA: Diagnosis not present

## 2023-07-15 DIAGNOSIS — E43 Unspecified severe protein-calorie malnutrition: Secondary | ICD-10-CM | POA: Diagnosis not present

## 2023-07-15 DIAGNOSIS — Z8719 Personal history of other diseases of the digestive system: Secondary | ICD-10-CM | POA: Diagnosis not present

## 2023-07-15 DIAGNOSIS — G311 Senile degeneration of brain, not elsewhere classified: Secondary | ICD-10-CM | POA: Diagnosis not present

## 2023-07-15 DIAGNOSIS — Z6820 Body mass index (BMI) 20.0-20.9, adult: Secondary | ICD-10-CM | POA: Diagnosis not present

## 2023-07-15 DIAGNOSIS — K219 Gastro-esophageal reflux disease without esophagitis: Secondary | ICD-10-CM | POA: Diagnosis not present

## 2023-07-16 DIAGNOSIS — K219 Gastro-esophageal reflux disease without esophagitis: Secondary | ICD-10-CM | POA: Diagnosis not present

## 2023-07-16 DIAGNOSIS — R296 Repeated falls: Secondary | ICD-10-CM | POA: Diagnosis not present

## 2023-07-16 DIAGNOSIS — Z6820 Body mass index (BMI) 20.0-20.9, adult: Secondary | ICD-10-CM | POA: Diagnosis not present

## 2023-07-16 DIAGNOSIS — Z8719 Personal history of other diseases of the digestive system: Secondary | ICD-10-CM | POA: Diagnosis not present

## 2023-07-16 DIAGNOSIS — G311 Senile degeneration of brain, not elsewhere classified: Secondary | ICD-10-CM | POA: Diagnosis not present

## 2023-07-16 DIAGNOSIS — E43 Unspecified severe protein-calorie malnutrition: Secondary | ICD-10-CM | POA: Diagnosis not present

## 2023-07-17 DIAGNOSIS — R296 Repeated falls: Secondary | ICD-10-CM | POA: Diagnosis not present

## 2023-07-17 DIAGNOSIS — Z6820 Body mass index (BMI) 20.0-20.9, adult: Secondary | ICD-10-CM | POA: Diagnosis not present

## 2023-07-17 DIAGNOSIS — K219 Gastro-esophageal reflux disease without esophagitis: Secondary | ICD-10-CM | POA: Diagnosis not present

## 2023-07-17 DIAGNOSIS — E43 Unspecified severe protein-calorie malnutrition: Secondary | ICD-10-CM | POA: Diagnosis not present

## 2023-07-17 DIAGNOSIS — G311 Senile degeneration of brain, not elsewhere classified: Secondary | ICD-10-CM | POA: Diagnosis not present

## 2023-07-17 DIAGNOSIS — Z8719 Personal history of other diseases of the digestive system: Secondary | ICD-10-CM | POA: Diagnosis not present

## 2023-07-18 DIAGNOSIS — Z8719 Personal history of other diseases of the digestive system: Secondary | ICD-10-CM | POA: Diagnosis not present

## 2023-07-18 DIAGNOSIS — G311 Senile degeneration of brain, not elsewhere classified: Secondary | ICD-10-CM | POA: Diagnosis not present

## 2023-07-18 DIAGNOSIS — R296 Repeated falls: Secondary | ICD-10-CM | POA: Diagnosis not present

## 2023-07-18 DIAGNOSIS — Z6820 Body mass index (BMI) 20.0-20.9, adult: Secondary | ICD-10-CM | POA: Diagnosis not present

## 2023-07-18 DIAGNOSIS — K219 Gastro-esophageal reflux disease without esophagitis: Secondary | ICD-10-CM | POA: Diagnosis not present

## 2023-07-18 DIAGNOSIS — E43 Unspecified severe protein-calorie malnutrition: Secondary | ICD-10-CM | POA: Diagnosis not present

## 2023-07-19 DIAGNOSIS — K5909 Other constipation: Secondary | ICD-10-CM | POA: Diagnosis not present

## 2023-07-19 DIAGNOSIS — E43 Unspecified severe protein-calorie malnutrition: Secondary | ICD-10-CM | POA: Diagnosis not present

## 2023-07-19 DIAGNOSIS — Z6820 Body mass index (BMI) 20.0-20.9, adult: Secondary | ICD-10-CM | POA: Diagnosis not present

## 2023-07-19 DIAGNOSIS — M150 Primary generalized (osteo)arthritis: Secondary | ICD-10-CM | POA: Diagnosis not present

## 2023-07-19 DIAGNOSIS — I480 Paroxysmal atrial fibrillation: Secondary | ICD-10-CM | POA: Diagnosis not present

## 2023-07-19 DIAGNOSIS — R296 Repeated falls: Secondary | ICD-10-CM | POA: Diagnosis not present

## 2023-07-19 DIAGNOSIS — G311 Senile degeneration of brain, not elsewhere classified: Secondary | ICD-10-CM | POA: Diagnosis not present

## 2023-07-19 DIAGNOSIS — F329 Major depressive disorder, single episode, unspecified: Secondary | ICD-10-CM | POA: Diagnosis not present

## 2023-07-19 DIAGNOSIS — F0280 Dementia in other diseases classified elsewhere without behavioral disturbance: Secondary | ICD-10-CM | POA: Diagnosis not present

## 2023-07-19 DIAGNOSIS — Z8719 Personal history of other diseases of the digestive system: Secondary | ICD-10-CM | POA: Diagnosis not present

## 2023-07-19 DIAGNOSIS — K219 Gastro-esophageal reflux disease without esophagitis: Secondary | ICD-10-CM | POA: Diagnosis not present

## 2023-07-19 DIAGNOSIS — H409 Unspecified glaucoma: Secondary | ICD-10-CM | POA: Diagnosis not present

## 2023-07-19 DIAGNOSIS — G4089 Other seizures: Secondary | ICD-10-CM | POA: Diagnosis not present

## 2023-07-20 DIAGNOSIS — E43 Unspecified severe protein-calorie malnutrition: Secondary | ICD-10-CM | POA: Diagnosis not present

## 2023-07-20 DIAGNOSIS — G311 Senile degeneration of brain, not elsewhere classified: Secondary | ICD-10-CM | POA: Diagnosis not present

## 2023-07-20 DIAGNOSIS — Z6820 Body mass index (BMI) 20.0-20.9, adult: Secondary | ICD-10-CM | POA: Diagnosis not present

## 2023-07-20 DIAGNOSIS — Z8719 Personal history of other diseases of the digestive system: Secondary | ICD-10-CM | POA: Diagnosis not present

## 2023-07-20 DIAGNOSIS — R296 Repeated falls: Secondary | ICD-10-CM | POA: Diagnosis not present

## 2023-07-20 DIAGNOSIS — K219 Gastro-esophageal reflux disease without esophagitis: Secondary | ICD-10-CM | POA: Diagnosis not present

## 2023-07-21 DIAGNOSIS — G311 Senile degeneration of brain, not elsewhere classified: Secondary | ICD-10-CM | POA: Diagnosis not present

## 2023-07-21 DIAGNOSIS — Z8719 Personal history of other diseases of the digestive system: Secondary | ICD-10-CM | POA: Diagnosis not present

## 2023-07-21 DIAGNOSIS — E43 Unspecified severe protein-calorie malnutrition: Secondary | ICD-10-CM | POA: Diagnosis not present

## 2023-07-21 DIAGNOSIS — Z6820 Body mass index (BMI) 20.0-20.9, adult: Secondary | ICD-10-CM | POA: Diagnosis not present

## 2023-07-21 DIAGNOSIS — R296 Repeated falls: Secondary | ICD-10-CM | POA: Diagnosis not present

## 2023-07-21 DIAGNOSIS — K219 Gastro-esophageal reflux disease without esophagitis: Secondary | ICD-10-CM | POA: Diagnosis not present

## 2023-07-22 DIAGNOSIS — E43 Unspecified severe protein-calorie malnutrition: Secondary | ICD-10-CM | POA: Diagnosis not present

## 2023-07-22 DIAGNOSIS — R296 Repeated falls: Secondary | ICD-10-CM | POA: Diagnosis not present

## 2023-07-22 DIAGNOSIS — K219 Gastro-esophageal reflux disease without esophagitis: Secondary | ICD-10-CM | POA: Diagnosis not present

## 2023-07-22 DIAGNOSIS — Z6820 Body mass index (BMI) 20.0-20.9, adult: Secondary | ICD-10-CM | POA: Diagnosis not present

## 2023-07-22 DIAGNOSIS — Z8719 Personal history of other diseases of the digestive system: Secondary | ICD-10-CM | POA: Diagnosis not present

## 2023-07-22 DIAGNOSIS — G311 Senile degeneration of brain, not elsewhere classified: Secondary | ICD-10-CM | POA: Diagnosis not present

## 2023-07-23 DIAGNOSIS — Z6820 Body mass index (BMI) 20.0-20.9, adult: Secondary | ICD-10-CM | POA: Diagnosis not present

## 2023-07-23 DIAGNOSIS — K219 Gastro-esophageal reflux disease without esophagitis: Secondary | ICD-10-CM | POA: Diagnosis not present

## 2023-07-23 DIAGNOSIS — G311 Senile degeneration of brain, not elsewhere classified: Secondary | ICD-10-CM | POA: Diagnosis not present

## 2023-07-23 DIAGNOSIS — R296 Repeated falls: Secondary | ICD-10-CM | POA: Diagnosis not present

## 2023-07-23 DIAGNOSIS — Z8719 Personal history of other diseases of the digestive system: Secondary | ICD-10-CM | POA: Diagnosis not present

## 2023-07-23 DIAGNOSIS — E43 Unspecified severe protein-calorie malnutrition: Secondary | ICD-10-CM | POA: Diagnosis not present

## 2023-07-24 DIAGNOSIS — Z8719 Personal history of other diseases of the digestive system: Secondary | ICD-10-CM | POA: Diagnosis not present

## 2023-07-24 DIAGNOSIS — Z6820 Body mass index (BMI) 20.0-20.9, adult: Secondary | ICD-10-CM | POA: Diagnosis not present

## 2023-07-24 DIAGNOSIS — R296 Repeated falls: Secondary | ICD-10-CM | POA: Diagnosis not present

## 2023-07-24 DIAGNOSIS — G311 Senile degeneration of brain, not elsewhere classified: Secondary | ICD-10-CM | POA: Diagnosis not present

## 2023-07-24 DIAGNOSIS — E43 Unspecified severe protein-calorie malnutrition: Secondary | ICD-10-CM | POA: Diagnosis not present

## 2023-07-24 DIAGNOSIS — K219 Gastro-esophageal reflux disease without esophagitis: Secondary | ICD-10-CM | POA: Diagnosis not present

## 2023-07-25 DIAGNOSIS — G4089 Other seizures: Secondary | ICD-10-CM | POA: Diagnosis not present

## 2023-07-25 DIAGNOSIS — E43 Unspecified severe protein-calorie malnutrition: Secondary | ICD-10-CM | POA: Diagnosis not present

## 2023-07-25 DIAGNOSIS — Z6820 Body mass index (BMI) 20.0-20.9, adult: Secondary | ICD-10-CM | POA: Diagnosis not present

## 2023-07-25 DIAGNOSIS — F329 Major depressive disorder, single episode, unspecified: Secondary | ICD-10-CM | POA: Diagnosis not present

## 2023-07-25 DIAGNOSIS — K219 Gastro-esophageal reflux disease without esophagitis: Secondary | ICD-10-CM | POA: Diagnosis not present

## 2023-07-25 DIAGNOSIS — F0280 Dementia in other diseases classified elsewhere without behavioral disturbance: Secondary | ICD-10-CM | POA: Diagnosis not present

## 2023-07-25 DIAGNOSIS — I480 Paroxysmal atrial fibrillation: Secondary | ICD-10-CM | POA: Diagnosis not present

## 2023-07-25 DIAGNOSIS — H409 Unspecified glaucoma: Secondary | ICD-10-CM | POA: Diagnosis not present

## 2023-07-25 DIAGNOSIS — G311 Senile degeneration of brain, not elsewhere classified: Secondary | ICD-10-CM | POA: Diagnosis not present

## 2023-07-25 DIAGNOSIS — R296 Repeated falls: Secondary | ICD-10-CM | POA: Diagnosis not present

## 2023-07-25 DIAGNOSIS — I10 Essential (primary) hypertension: Secondary | ICD-10-CM | POA: Diagnosis not present

## 2023-07-25 DIAGNOSIS — Z8719 Personal history of other diseases of the digestive system: Secondary | ICD-10-CM | POA: Diagnosis not present

## 2023-07-26 DIAGNOSIS — Z6820 Body mass index (BMI) 20.0-20.9, adult: Secondary | ICD-10-CM | POA: Diagnosis not present

## 2023-07-26 DIAGNOSIS — K219 Gastro-esophageal reflux disease without esophagitis: Secondary | ICD-10-CM | POA: Diagnosis not present

## 2023-07-26 DIAGNOSIS — Z8719 Personal history of other diseases of the digestive system: Secondary | ICD-10-CM | POA: Diagnosis not present

## 2023-07-26 DIAGNOSIS — G311 Senile degeneration of brain, not elsewhere classified: Secondary | ICD-10-CM | POA: Diagnosis not present

## 2023-07-26 DIAGNOSIS — E43 Unspecified severe protein-calorie malnutrition: Secondary | ICD-10-CM | POA: Diagnosis not present

## 2023-07-26 DIAGNOSIS — R296 Repeated falls: Secondary | ICD-10-CM | POA: Diagnosis not present

## 2023-07-27 DIAGNOSIS — E43 Unspecified severe protein-calorie malnutrition: Secondary | ICD-10-CM | POA: Diagnosis not present

## 2023-07-27 DIAGNOSIS — Z6820 Body mass index (BMI) 20.0-20.9, adult: Secondary | ICD-10-CM | POA: Diagnosis not present

## 2023-07-27 DIAGNOSIS — R296 Repeated falls: Secondary | ICD-10-CM | POA: Diagnosis not present

## 2023-07-27 DIAGNOSIS — K219 Gastro-esophageal reflux disease without esophagitis: Secondary | ICD-10-CM | POA: Diagnosis not present

## 2023-07-27 DIAGNOSIS — Z8719 Personal history of other diseases of the digestive system: Secondary | ICD-10-CM | POA: Diagnosis not present

## 2023-07-27 DIAGNOSIS — G311 Senile degeneration of brain, not elsewhere classified: Secondary | ICD-10-CM | POA: Diagnosis not present

## 2023-07-28 DIAGNOSIS — G311 Senile degeneration of brain, not elsewhere classified: Secondary | ICD-10-CM | POA: Diagnosis not present

## 2023-07-28 DIAGNOSIS — F329 Major depressive disorder, single episode, unspecified: Secondary | ICD-10-CM | POA: Diagnosis not present

## 2023-07-28 DIAGNOSIS — F0390 Unspecified dementia without behavioral disturbance: Secondary | ICD-10-CM | POA: Diagnosis not present

## 2023-07-28 DIAGNOSIS — Z8719 Personal history of other diseases of the digestive system: Secondary | ICD-10-CM | POA: Diagnosis not present

## 2023-07-28 DIAGNOSIS — E43 Unspecified severe protein-calorie malnutrition: Secondary | ICD-10-CM | POA: Diagnosis not present

## 2023-07-28 DIAGNOSIS — K219 Gastro-esophageal reflux disease without esophagitis: Secondary | ICD-10-CM | POA: Diagnosis not present

## 2023-07-28 DIAGNOSIS — Z6820 Body mass index (BMI) 20.0-20.9, adult: Secondary | ICD-10-CM | POA: Diagnosis not present

## 2023-07-28 DIAGNOSIS — R296 Repeated falls: Secondary | ICD-10-CM | POA: Diagnosis not present

## 2023-07-28 DIAGNOSIS — I482 Chronic atrial fibrillation, unspecified: Secondary | ICD-10-CM | POA: Diagnosis not present

## 2023-07-29 DIAGNOSIS — Z8719 Personal history of other diseases of the digestive system: Secondary | ICD-10-CM | POA: Diagnosis not present

## 2023-07-29 DIAGNOSIS — E43 Unspecified severe protein-calorie malnutrition: Secondary | ICD-10-CM | POA: Diagnosis not present

## 2023-07-29 DIAGNOSIS — G311 Senile degeneration of brain, not elsewhere classified: Secondary | ICD-10-CM | POA: Diagnosis not present

## 2023-07-29 DIAGNOSIS — R296 Repeated falls: Secondary | ICD-10-CM | POA: Diagnosis not present

## 2023-07-29 DIAGNOSIS — K219 Gastro-esophageal reflux disease without esophagitis: Secondary | ICD-10-CM | POA: Diagnosis not present

## 2023-07-29 DIAGNOSIS — Z6820 Body mass index (BMI) 20.0-20.9, adult: Secondary | ICD-10-CM | POA: Diagnosis not present

## 2023-07-30 DIAGNOSIS — G311 Senile degeneration of brain, not elsewhere classified: Secondary | ICD-10-CM | POA: Diagnosis not present

## 2023-07-30 DIAGNOSIS — E43 Unspecified severe protein-calorie malnutrition: Secondary | ICD-10-CM | POA: Diagnosis not present

## 2023-07-30 DIAGNOSIS — Z8719 Personal history of other diseases of the digestive system: Secondary | ICD-10-CM | POA: Diagnosis not present

## 2023-07-30 DIAGNOSIS — K219 Gastro-esophageal reflux disease without esophagitis: Secondary | ICD-10-CM | POA: Diagnosis not present

## 2023-07-30 DIAGNOSIS — Z6820 Body mass index (BMI) 20.0-20.9, adult: Secondary | ICD-10-CM | POA: Diagnosis not present

## 2023-07-30 DIAGNOSIS — R296 Repeated falls: Secondary | ICD-10-CM | POA: Diagnosis not present

## 2023-07-31 DIAGNOSIS — R296 Repeated falls: Secondary | ICD-10-CM | POA: Diagnosis not present

## 2023-07-31 DIAGNOSIS — G311 Senile degeneration of brain, not elsewhere classified: Secondary | ICD-10-CM | POA: Diagnosis not present

## 2023-07-31 DIAGNOSIS — E43 Unspecified severe protein-calorie malnutrition: Secondary | ICD-10-CM | POA: Diagnosis not present

## 2023-07-31 DIAGNOSIS — K219 Gastro-esophageal reflux disease without esophagitis: Secondary | ICD-10-CM | POA: Diagnosis not present

## 2023-07-31 DIAGNOSIS — Z8719 Personal history of other diseases of the digestive system: Secondary | ICD-10-CM | POA: Diagnosis not present

## 2023-07-31 DIAGNOSIS — Z6820 Body mass index (BMI) 20.0-20.9, adult: Secondary | ICD-10-CM | POA: Diagnosis not present

## 2023-08-01 DIAGNOSIS — R296 Repeated falls: Secondary | ICD-10-CM | POA: Diagnosis not present

## 2023-08-01 DIAGNOSIS — K219 Gastro-esophageal reflux disease without esophagitis: Secondary | ICD-10-CM | POA: Diagnosis not present

## 2023-08-01 DIAGNOSIS — G311 Senile degeneration of brain, not elsewhere classified: Secondary | ICD-10-CM | POA: Diagnosis not present

## 2023-08-01 DIAGNOSIS — Z6820 Body mass index (BMI) 20.0-20.9, adult: Secondary | ICD-10-CM | POA: Diagnosis not present

## 2023-08-01 DIAGNOSIS — E43 Unspecified severe protein-calorie malnutrition: Secondary | ICD-10-CM | POA: Diagnosis not present

## 2023-08-01 DIAGNOSIS — Z8719 Personal history of other diseases of the digestive system: Secondary | ICD-10-CM | POA: Diagnosis not present

## 2023-08-02 DIAGNOSIS — E43 Unspecified severe protein-calorie malnutrition: Secondary | ICD-10-CM | POA: Diagnosis not present

## 2023-08-02 DIAGNOSIS — R296 Repeated falls: Secondary | ICD-10-CM | POA: Diagnosis not present

## 2023-08-02 DIAGNOSIS — G311 Senile degeneration of brain, not elsewhere classified: Secondary | ICD-10-CM | POA: Diagnosis not present

## 2023-08-02 DIAGNOSIS — K219 Gastro-esophageal reflux disease without esophagitis: Secondary | ICD-10-CM | POA: Diagnosis not present

## 2023-08-02 DIAGNOSIS — Z8719 Personal history of other diseases of the digestive system: Secondary | ICD-10-CM | POA: Diagnosis not present

## 2023-08-02 DIAGNOSIS — Z6820 Body mass index (BMI) 20.0-20.9, adult: Secondary | ICD-10-CM | POA: Diagnosis not present

## 2023-08-03 DIAGNOSIS — Z8719 Personal history of other diseases of the digestive system: Secondary | ICD-10-CM | POA: Diagnosis not present

## 2023-08-03 DIAGNOSIS — G311 Senile degeneration of brain, not elsewhere classified: Secondary | ICD-10-CM | POA: Diagnosis not present

## 2023-08-03 DIAGNOSIS — K219 Gastro-esophageal reflux disease without esophagitis: Secondary | ICD-10-CM | POA: Diagnosis not present

## 2023-08-03 DIAGNOSIS — E43 Unspecified severe protein-calorie malnutrition: Secondary | ICD-10-CM | POA: Diagnosis not present

## 2023-08-03 DIAGNOSIS — Z6820 Body mass index (BMI) 20.0-20.9, adult: Secondary | ICD-10-CM | POA: Diagnosis not present

## 2023-08-03 DIAGNOSIS — R296 Repeated falls: Secondary | ICD-10-CM | POA: Diagnosis not present

## 2023-08-04 DIAGNOSIS — K219 Gastro-esophageal reflux disease without esophagitis: Secondary | ICD-10-CM | POA: Diagnosis not present

## 2023-08-04 DIAGNOSIS — G311 Senile degeneration of brain, not elsewhere classified: Secondary | ICD-10-CM | POA: Diagnosis not present

## 2023-08-04 DIAGNOSIS — R296 Repeated falls: Secondary | ICD-10-CM | POA: Diagnosis not present

## 2023-08-04 DIAGNOSIS — Z8719 Personal history of other diseases of the digestive system: Secondary | ICD-10-CM | POA: Diagnosis not present

## 2023-08-04 DIAGNOSIS — Z6820 Body mass index (BMI) 20.0-20.9, adult: Secondary | ICD-10-CM | POA: Diagnosis not present

## 2023-08-04 DIAGNOSIS — E43 Unspecified severe protein-calorie malnutrition: Secondary | ICD-10-CM | POA: Diagnosis not present

## 2023-08-05 DIAGNOSIS — G311 Senile degeneration of brain, not elsewhere classified: Secondary | ICD-10-CM | POA: Diagnosis not present

## 2023-08-05 DIAGNOSIS — Z8719 Personal history of other diseases of the digestive system: Secondary | ICD-10-CM | POA: Diagnosis not present

## 2023-08-05 DIAGNOSIS — K219 Gastro-esophageal reflux disease without esophagitis: Secondary | ICD-10-CM | POA: Diagnosis not present

## 2023-08-05 DIAGNOSIS — R296 Repeated falls: Secondary | ICD-10-CM | POA: Diagnosis not present

## 2023-08-05 DIAGNOSIS — Z6820 Body mass index (BMI) 20.0-20.9, adult: Secondary | ICD-10-CM | POA: Diagnosis not present

## 2023-08-05 DIAGNOSIS — E43 Unspecified severe protein-calorie malnutrition: Secondary | ICD-10-CM | POA: Diagnosis not present

## 2023-08-06 DIAGNOSIS — E43 Unspecified severe protein-calorie malnutrition: Secondary | ICD-10-CM | POA: Diagnosis not present

## 2023-08-06 DIAGNOSIS — K219 Gastro-esophageal reflux disease without esophagitis: Secondary | ICD-10-CM | POA: Diagnosis not present

## 2023-08-06 DIAGNOSIS — G311 Senile degeneration of brain, not elsewhere classified: Secondary | ICD-10-CM | POA: Diagnosis not present

## 2023-08-06 DIAGNOSIS — Z6820 Body mass index (BMI) 20.0-20.9, adult: Secondary | ICD-10-CM | POA: Diagnosis not present

## 2023-08-06 DIAGNOSIS — R296 Repeated falls: Secondary | ICD-10-CM | POA: Diagnosis not present

## 2023-08-06 DIAGNOSIS — Z8719 Personal history of other diseases of the digestive system: Secondary | ICD-10-CM | POA: Diagnosis not present

## 2023-08-07 DIAGNOSIS — I10 Essential (primary) hypertension: Secondary | ICD-10-CM | POA: Diagnosis not present

## 2023-08-07 DIAGNOSIS — F331 Major depressive disorder, recurrent, moderate: Secondary | ICD-10-CM | POA: Diagnosis not present

## 2023-08-07 DIAGNOSIS — Z6820 Body mass index (BMI) 20.0-20.9, adult: Secondary | ICD-10-CM | POA: Diagnosis not present

## 2023-08-07 DIAGNOSIS — G311 Senile degeneration of brain, not elsewhere classified: Secondary | ICD-10-CM | POA: Diagnosis not present

## 2023-08-07 DIAGNOSIS — K219 Gastro-esophageal reflux disease without esophagitis: Secondary | ICD-10-CM | POA: Diagnosis not present

## 2023-08-07 DIAGNOSIS — R293 Abnormal posture: Secondary | ICD-10-CM | POA: Diagnosis not present

## 2023-08-07 DIAGNOSIS — R569 Unspecified convulsions: Secondary | ICD-10-CM | POA: Diagnosis not present

## 2023-08-07 DIAGNOSIS — R296 Repeated falls: Secondary | ICD-10-CM | POA: Diagnosis not present

## 2023-08-07 DIAGNOSIS — E785 Hyperlipidemia, unspecified: Secondary | ICD-10-CM | POA: Diagnosis not present

## 2023-08-07 DIAGNOSIS — M810 Age-related osteoporosis without current pathological fracture: Secondary | ICD-10-CM | POA: Diagnosis not present

## 2023-08-07 DIAGNOSIS — E43 Unspecified severe protein-calorie malnutrition: Secondary | ICD-10-CM | POA: Diagnosis not present

## 2023-08-07 DIAGNOSIS — H409 Unspecified glaucoma: Secondary | ICD-10-CM | POA: Diagnosis not present

## 2023-08-07 DIAGNOSIS — F0390 Unspecified dementia without behavioral disturbance: Secondary | ICD-10-CM | POA: Diagnosis not present

## 2023-08-07 DIAGNOSIS — D649 Anemia, unspecified: Secondary | ICD-10-CM | POA: Diagnosis not present

## 2023-08-07 DIAGNOSIS — Z8719 Personal history of other diseases of the digestive system: Secondary | ICD-10-CM | POA: Diagnosis not present

## 2023-08-07 DIAGNOSIS — I4891 Unspecified atrial fibrillation: Secondary | ICD-10-CM | POA: Diagnosis not present

## 2023-08-08 DIAGNOSIS — F0280 Dementia in other diseases classified elsewhere without behavioral disturbance: Secondary | ICD-10-CM | POA: Diagnosis not present

## 2023-08-08 DIAGNOSIS — R251 Tremor, unspecified: Secondary | ICD-10-CM | POA: Diagnosis not present

## 2023-08-08 DIAGNOSIS — G4089 Other seizures: Secondary | ICD-10-CM | POA: Diagnosis not present

## 2023-08-08 DIAGNOSIS — I482 Chronic atrial fibrillation, unspecified: Secondary | ICD-10-CM | POA: Diagnosis not present

## 2023-08-08 DIAGNOSIS — K5909 Other constipation: Secondary | ICD-10-CM | POA: Diagnosis not present

## 2023-08-08 DIAGNOSIS — G311 Senile degeneration of brain, not elsewhere classified: Secondary | ICD-10-CM | POA: Diagnosis not present

## 2023-08-08 DIAGNOSIS — R296 Repeated falls: Secondary | ICD-10-CM | POA: Diagnosis not present

## 2023-08-08 DIAGNOSIS — F329 Major depressive disorder, single episode, unspecified: Secondary | ICD-10-CM | POA: Diagnosis not present

## 2023-08-08 DIAGNOSIS — K219 Gastro-esophageal reflux disease without esophagitis: Secondary | ICD-10-CM | POA: Diagnosis not present

## 2023-08-08 DIAGNOSIS — Z8719 Personal history of other diseases of the digestive system: Secondary | ICD-10-CM | POA: Diagnosis not present

## 2023-08-08 DIAGNOSIS — I10 Essential (primary) hypertension: Secondary | ICD-10-CM | POA: Diagnosis not present

## 2023-08-08 DIAGNOSIS — E43 Unspecified severe protein-calorie malnutrition: Secondary | ICD-10-CM | POA: Diagnosis not present

## 2023-08-08 DIAGNOSIS — Z6820 Body mass index (BMI) 20.0-20.9, adult: Secondary | ICD-10-CM | POA: Diagnosis not present

## 2023-08-09 DIAGNOSIS — R569 Unspecified convulsions: Secondary | ICD-10-CM | POA: Diagnosis not present

## 2023-08-09 DIAGNOSIS — F0390 Unspecified dementia without behavioral disturbance: Secondary | ICD-10-CM | POA: Diagnosis not present

## 2023-08-09 DIAGNOSIS — M810 Age-related osteoporosis without current pathological fracture: Secondary | ICD-10-CM | POA: Diagnosis not present

## 2023-08-09 DIAGNOSIS — R293 Abnormal posture: Secondary | ICD-10-CM | POA: Diagnosis not present

## 2023-08-09 DIAGNOSIS — H409 Unspecified glaucoma: Secondary | ICD-10-CM | POA: Diagnosis not present

## 2023-08-09 DIAGNOSIS — I10 Essential (primary) hypertension: Secondary | ICD-10-CM | POA: Diagnosis not present

## 2023-08-09 DIAGNOSIS — I4891 Unspecified atrial fibrillation: Secondary | ICD-10-CM | POA: Diagnosis not present

## 2023-08-09 DIAGNOSIS — K219 Gastro-esophageal reflux disease without esophagitis: Secondary | ICD-10-CM | POA: Diagnosis not present

## 2023-08-09 DIAGNOSIS — F331 Major depressive disorder, recurrent, moderate: Secondary | ICD-10-CM | POA: Diagnosis not present

## 2023-08-09 DIAGNOSIS — E43 Unspecified severe protein-calorie malnutrition: Secondary | ICD-10-CM | POA: Diagnosis not present

## 2023-08-09 DIAGNOSIS — D649 Anemia, unspecified: Secondary | ICD-10-CM | POA: Diagnosis not present

## 2023-08-09 DIAGNOSIS — G311 Senile degeneration of brain, not elsewhere classified: Secondary | ICD-10-CM | POA: Diagnosis not present

## 2023-08-09 DIAGNOSIS — R296 Repeated falls: Secondary | ICD-10-CM | POA: Diagnosis not present

## 2023-08-09 DIAGNOSIS — E785 Hyperlipidemia, unspecified: Secondary | ICD-10-CM | POA: Diagnosis not present

## 2023-08-09 DIAGNOSIS — Z8719 Personal history of other diseases of the digestive system: Secondary | ICD-10-CM | POA: Diagnosis not present

## 2023-08-09 DIAGNOSIS — Z6820 Body mass index (BMI) 20.0-20.9, adult: Secondary | ICD-10-CM | POA: Diagnosis not present

## 2023-08-10 DIAGNOSIS — K219 Gastro-esophageal reflux disease without esophagitis: Secondary | ICD-10-CM | POA: Diagnosis not present

## 2023-08-10 DIAGNOSIS — R569 Unspecified convulsions: Secondary | ICD-10-CM | POA: Diagnosis not present

## 2023-08-10 DIAGNOSIS — E43 Unspecified severe protein-calorie malnutrition: Secondary | ICD-10-CM | POA: Diagnosis not present

## 2023-08-10 DIAGNOSIS — G311 Senile degeneration of brain, not elsewhere classified: Secondary | ICD-10-CM | POA: Diagnosis not present

## 2023-08-10 DIAGNOSIS — F0390 Unspecified dementia without behavioral disturbance: Secondary | ICD-10-CM | POA: Diagnosis not present

## 2023-08-10 DIAGNOSIS — R293 Abnormal posture: Secondary | ICD-10-CM | POA: Diagnosis not present

## 2023-08-10 DIAGNOSIS — F331 Major depressive disorder, recurrent, moderate: Secondary | ICD-10-CM | POA: Diagnosis not present

## 2023-08-10 DIAGNOSIS — E785 Hyperlipidemia, unspecified: Secondary | ICD-10-CM | POA: Diagnosis not present

## 2023-08-10 DIAGNOSIS — D649 Anemia, unspecified: Secondary | ICD-10-CM | POA: Diagnosis not present

## 2023-08-10 DIAGNOSIS — I4891 Unspecified atrial fibrillation: Secondary | ICD-10-CM | POA: Diagnosis not present

## 2023-08-10 DIAGNOSIS — R296 Repeated falls: Secondary | ICD-10-CM | POA: Diagnosis not present

## 2023-08-10 DIAGNOSIS — I10 Essential (primary) hypertension: Secondary | ICD-10-CM | POA: Diagnosis not present

## 2023-08-10 DIAGNOSIS — H409 Unspecified glaucoma: Secondary | ICD-10-CM | POA: Diagnosis not present

## 2023-08-10 DIAGNOSIS — M810 Age-related osteoporosis without current pathological fracture: Secondary | ICD-10-CM | POA: Diagnosis not present

## 2023-08-10 DIAGNOSIS — Z8719 Personal history of other diseases of the digestive system: Secondary | ICD-10-CM | POA: Diagnosis not present

## 2023-08-10 DIAGNOSIS — Z6820 Body mass index (BMI) 20.0-20.9, adult: Secondary | ICD-10-CM | POA: Diagnosis not present

## 2023-08-11 DIAGNOSIS — Z8719 Personal history of other diseases of the digestive system: Secondary | ICD-10-CM | POA: Diagnosis not present

## 2023-08-11 DIAGNOSIS — D631 Anemia in chronic kidney disease: Secondary | ICD-10-CM | POA: Diagnosis not present

## 2023-08-11 DIAGNOSIS — G311 Senile degeneration of brain, not elsewhere classified: Secondary | ICD-10-CM | POA: Diagnosis not present

## 2023-08-11 DIAGNOSIS — Z6820 Body mass index (BMI) 20.0-20.9, adult: Secondary | ICD-10-CM | POA: Diagnosis not present

## 2023-08-11 DIAGNOSIS — Z87440 Personal history of urinary (tract) infections: Secondary | ICD-10-CM | POA: Diagnosis not present

## 2023-08-11 DIAGNOSIS — E43 Unspecified severe protein-calorie malnutrition: Secondary | ICD-10-CM | POA: Diagnosis not present

## 2023-08-11 DIAGNOSIS — N1831 Chronic kidney disease, stage 3a: Secondary | ICD-10-CM | POA: Diagnosis not present

## 2023-08-11 DIAGNOSIS — I4891 Unspecified atrial fibrillation: Secondary | ICD-10-CM | POA: Diagnosis not present

## 2023-08-11 DIAGNOSIS — K219 Gastro-esophageal reflux disease without esophagitis: Secondary | ICD-10-CM | POA: Diagnosis not present

## 2023-08-11 DIAGNOSIS — I129 Hypertensive chronic kidney disease with stage 1 through stage 4 chronic kidney disease, or unspecified chronic kidney disease: Secondary | ICD-10-CM | POA: Diagnosis not present

## 2023-08-11 DIAGNOSIS — E785 Hyperlipidemia, unspecified: Secondary | ICD-10-CM | POA: Diagnosis not present

## 2023-08-11 DIAGNOSIS — R296 Repeated falls: Secondary | ICD-10-CM | POA: Diagnosis not present

## 2023-08-12 DIAGNOSIS — K219 Gastro-esophageal reflux disease without esophagitis: Secondary | ICD-10-CM | POA: Diagnosis not present

## 2023-08-12 DIAGNOSIS — G311 Senile degeneration of brain, not elsewhere classified: Secondary | ICD-10-CM | POA: Diagnosis not present

## 2023-08-12 DIAGNOSIS — Z6820 Body mass index (BMI) 20.0-20.9, adult: Secondary | ICD-10-CM | POA: Diagnosis not present

## 2023-08-12 DIAGNOSIS — R296 Repeated falls: Secondary | ICD-10-CM | POA: Diagnosis not present

## 2023-08-12 DIAGNOSIS — Z8719 Personal history of other diseases of the digestive system: Secondary | ICD-10-CM | POA: Diagnosis not present

## 2023-08-12 DIAGNOSIS — E43 Unspecified severe protein-calorie malnutrition: Secondary | ICD-10-CM | POA: Diagnosis not present

## 2023-08-13 DIAGNOSIS — Z6820 Body mass index (BMI) 20.0-20.9, adult: Secondary | ICD-10-CM | POA: Diagnosis not present

## 2023-08-13 DIAGNOSIS — E43 Unspecified severe protein-calorie malnutrition: Secondary | ICD-10-CM | POA: Diagnosis not present

## 2023-08-13 DIAGNOSIS — K219 Gastro-esophageal reflux disease without esophagitis: Secondary | ICD-10-CM | POA: Diagnosis not present

## 2023-08-13 DIAGNOSIS — G311 Senile degeneration of brain, not elsewhere classified: Secondary | ICD-10-CM | POA: Diagnosis not present

## 2023-08-13 DIAGNOSIS — Z8719 Personal history of other diseases of the digestive system: Secondary | ICD-10-CM | POA: Diagnosis not present

## 2023-08-13 DIAGNOSIS — R296 Repeated falls: Secondary | ICD-10-CM | POA: Diagnosis not present

## 2023-08-18 DIAGNOSIS — Z8719 Personal history of other diseases of the digestive system: Secondary | ICD-10-CM | POA: Diagnosis not present

## 2023-08-18 DIAGNOSIS — G311 Senile degeneration of brain, not elsewhere classified: Secondary | ICD-10-CM | POA: Diagnosis not present

## 2023-08-18 DIAGNOSIS — K219 Gastro-esophageal reflux disease without esophagitis: Secondary | ICD-10-CM | POA: Diagnosis not present

## 2023-08-18 DIAGNOSIS — R296 Repeated falls: Secondary | ICD-10-CM | POA: Diagnosis not present

## 2023-08-18 DIAGNOSIS — E43 Unspecified severe protein-calorie malnutrition: Secondary | ICD-10-CM | POA: Diagnosis not present

## 2023-08-18 DIAGNOSIS — Z6820 Body mass index (BMI) 20.0-20.9, adult: Secondary | ICD-10-CM | POA: Diagnosis not present

## 2023-08-19 DIAGNOSIS — Z8719 Personal history of other diseases of the digestive system: Secondary | ICD-10-CM | POA: Diagnosis not present

## 2023-08-19 DIAGNOSIS — R296 Repeated falls: Secondary | ICD-10-CM | POA: Diagnosis not present

## 2023-08-19 DIAGNOSIS — K219 Gastro-esophageal reflux disease without esophagitis: Secondary | ICD-10-CM | POA: Diagnosis not present

## 2023-08-19 DIAGNOSIS — G311 Senile degeneration of brain, not elsewhere classified: Secondary | ICD-10-CM | POA: Diagnosis not present

## 2023-08-19 DIAGNOSIS — E43 Unspecified severe protein-calorie malnutrition: Secondary | ICD-10-CM | POA: Diagnosis not present

## 2023-08-19 DIAGNOSIS — Z6820 Body mass index (BMI) 20.0-20.9, adult: Secondary | ICD-10-CM | POA: Diagnosis not present

## 2023-08-21 DIAGNOSIS — R296 Repeated falls: Secondary | ICD-10-CM | POA: Diagnosis not present

## 2023-08-21 DIAGNOSIS — E43 Unspecified severe protein-calorie malnutrition: Secondary | ICD-10-CM | POA: Diagnosis not present

## 2023-08-21 DIAGNOSIS — K219 Gastro-esophageal reflux disease without esophagitis: Secondary | ICD-10-CM | POA: Diagnosis not present

## 2023-08-21 DIAGNOSIS — Z8719 Personal history of other diseases of the digestive system: Secondary | ICD-10-CM | POA: Diagnosis not present

## 2023-08-21 DIAGNOSIS — G311 Senile degeneration of brain, not elsewhere classified: Secondary | ICD-10-CM | POA: Diagnosis not present

## 2023-08-21 DIAGNOSIS — Z6820 Body mass index (BMI) 20.0-20.9, adult: Secondary | ICD-10-CM | POA: Diagnosis not present

## 2023-08-24 DIAGNOSIS — G311 Senile degeneration of brain, not elsewhere classified: Secondary | ICD-10-CM | POA: Diagnosis not present

## 2023-08-24 DIAGNOSIS — Z6820 Body mass index (BMI) 20.0-20.9, adult: Secondary | ICD-10-CM | POA: Diagnosis not present

## 2023-08-24 DIAGNOSIS — E43 Unspecified severe protein-calorie malnutrition: Secondary | ICD-10-CM | POA: Diagnosis not present

## 2023-08-24 DIAGNOSIS — K219 Gastro-esophageal reflux disease without esophagitis: Secondary | ICD-10-CM | POA: Diagnosis not present

## 2023-08-24 DIAGNOSIS — R296 Repeated falls: Secondary | ICD-10-CM | POA: Diagnosis not present

## 2023-08-24 DIAGNOSIS — Z8719 Personal history of other diseases of the digestive system: Secondary | ICD-10-CM | POA: Diagnosis not present

## 2023-08-25 DIAGNOSIS — R296 Repeated falls: Secondary | ICD-10-CM | POA: Diagnosis not present

## 2023-08-25 DIAGNOSIS — E43 Unspecified severe protein-calorie malnutrition: Secondary | ICD-10-CM | POA: Diagnosis not present

## 2023-08-25 DIAGNOSIS — K219 Gastro-esophageal reflux disease without esophagitis: Secondary | ICD-10-CM | POA: Diagnosis not present

## 2023-08-25 DIAGNOSIS — Z6820 Body mass index (BMI) 20.0-20.9, adult: Secondary | ICD-10-CM | POA: Diagnosis not present

## 2023-08-25 DIAGNOSIS — Z8719 Personal history of other diseases of the digestive system: Secondary | ICD-10-CM | POA: Diagnosis not present

## 2023-08-25 DIAGNOSIS — G311 Senile degeneration of brain, not elsewhere classified: Secondary | ICD-10-CM | POA: Diagnosis not present

## 2023-08-26 DIAGNOSIS — K219 Gastro-esophageal reflux disease without esophagitis: Secondary | ICD-10-CM | POA: Diagnosis not present

## 2023-08-26 DIAGNOSIS — E43 Unspecified severe protein-calorie malnutrition: Secondary | ICD-10-CM | POA: Diagnosis not present

## 2023-08-26 DIAGNOSIS — Z8719 Personal history of other diseases of the digestive system: Secondary | ICD-10-CM | POA: Diagnosis not present

## 2023-08-26 DIAGNOSIS — Z6820 Body mass index (BMI) 20.0-20.9, adult: Secondary | ICD-10-CM | POA: Diagnosis not present

## 2023-08-26 DIAGNOSIS — R296 Repeated falls: Secondary | ICD-10-CM | POA: Diagnosis not present

## 2023-08-26 DIAGNOSIS — G311 Senile degeneration of brain, not elsewhere classified: Secondary | ICD-10-CM | POA: Diagnosis not present

## 2023-08-28 DIAGNOSIS — R296 Repeated falls: Secondary | ICD-10-CM | POA: Diagnosis not present

## 2023-08-28 DIAGNOSIS — K219 Gastro-esophageal reflux disease without esophagitis: Secondary | ICD-10-CM | POA: Diagnosis not present

## 2023-08-28 DIAGNOSIS — G311 Senile degeneration of brain, not elsewhere classified: Secondary | ICD-10-CM | POA: Diagnosis not present

## 2023-08-28 DIAGNOSIS — Z6820 Body mass index (BMI) 20.0-20.9, adult: Secondary | ICD-10-CM | POA: Diagnosis not present

## 2023-08-28 DIAGNOSIS — E43 Unspecified severe protein-calorie malnutrition: Secondary | ICD-10-CM | POA: Diagnosis not present

## 2023-08-28 DIAGNOSIS — Z8719 Personal history of other diseases of the digestive system: Secondary | ICD-10-CM | POA: Diagnosis not present

## 2023-08-31 DIAGNOSIS — Z6820 Body mass index (BMI) 20.0-20.9, adult: Secondary | ICD-10-CM | POA: Diagnosis not present

## 2023-08-31 DIAGNOSIS — G311 Senile degeneration of brain, not elsewhere classified: Secondary | ICD-10-CM | POA: Diagnosis not present

## 2023-08-31 DIAGNOSIS — Z8719 Personal history of other diseases of the digestive system: Secondary | ICD-10-CM | POA: Diagnosis not present

## 2023-08-31 DIAGNOSIS — K219 Gastro-esophageal reflux disease without esophagitis: Secondary | ICD-10-CM | POA: Diagnosis not present

## 2023-08-31 DIAGNOSIS — R296 Repeated falls: Secondary | ICD-10-CM | POA: Diagnosis not present

## 2023-08-31 DIAGNOSIS — E43 Unspecified severe protein-calorie malnutrition: Secondary | ICD-10-CM | POA: Diagnosis not present

## 2023-09-01 DIAGNOSIS — I10 Essential (primary) hypertension: Secondary | ICD-10-CM | POA: Diagnosis not present

## 2023-09-01 DIAGNOSIS — R569 Unspecified convulsions: Secondary | ICD-10-CM | POA: Diagnosis not present

## 2023-09-01 DIAGNOSIS — F0390 Unspecified dementia without behavioral disturbance: Secondary | ICD-10-CM | POA: Diagnosis not present

## 2023-09-01 DIAGNOSIS — R293 Abnormal posture: Secondary | ICD-10-CM | POA: Diagnosis not present

## 2023-09-01 DIAGNOSIS — E785 Hyperlipidemia, unspecified: Secondary | ICD-10-CM | POA: Diagnosis not present

## 2023-09-01 DIAGNOSIS — M810 Age-related osteoporosis without current pathological fracture: Secondary | ICD-10-CM | POA: Diagnosis not present

## 2023-09-01 DIAGNOSIS — F331 Major depressive disorder, recurrent, moderate: Secondary | ICD-10-CM | POA: Diagnosis not present

## 2023-09-01 DIAGNOSIS — I4891 Unspecified atrial fibrillation: Secondary | ICD-10-CM | POA: Diagnosis not present

## 2023-09-01 DIAGNOSIS — H409 Unspecified glaucoma: Secondary | ICD-10-CM | POA: Diagnosis not present

## 2023-09-01 DIAGNOSIS — D649 Anemia, unspecified: Secondary | ICD-10-CM | POA: Diagnosis not present

## 2023-09-02 DIAGNOSIS — K219 Gastro-esophageal reflux disease without esophagitis: Secondary | ICD-10-CM | POA: Diagnosis not present

## 2023-09-02 DIAGNOSIS — G311 Senile degeneration of brain, not elsewhere classified: Secondary | ICD-10-CM | POA: Diagnosis not present

## 2023-09-02 DIAGNOSIS — E43 Unspecified severe protein-calorie malnutrition: Secondary | ICD-10-CM | POA: Diagnosis not present

## 2023-09-02 DIAGNOSIS — Z6820 Body mass index (BMI) 20.0-20.9, adult: Secondary | ICD-10-CM | POA: Diagnosis not present

## 2023-09-02 DIAGNOSIS — Z8719 Personal history of other diseases of the digestive system: Secondary | ICD-10-CM | POA: Diagnosis not present

## 2023-09-02 DIAGNOSIS — R296 Repeated falls: Secondary | ICD-10-CM | POA: Diagnosis not present

## 2023-09-03 DIAGNOSIS — R296 Repeated falls: Secondary | ICD-10-CM | POA: Diagnosis not present

## 2023-09-03 DIAGNOSIS — K219 Gastro-esophageal reflux disease without esophagitis: Secondary | ICD-10-CM | POA: Diagnosis not present

## 2023-09-03 DIAGNOSIS — Z6820 Body mass index (BMI) 20.0-20.9, adult: Secondary | ICD-10-CM | POA: Diagnosis not present

## 2023-09-03 DIAGNOSIS — G311 Senile degeneration of brain, not elsewhere classified: Secondary | ICD-10-CM | POA: Diagnosis not present

## 2023-09-03 DIAGNOSIS — Z8719 Personal history of other diseases of the digestive system: Secondary | ICD-10-CM | POA: Diagnosis not present

## 2023-09-03 DIAGNOSIS — E43 Unspecified severe protein-calorie malnutrition: Secondary | ICD-10-CM | POA: Diagnosis not present

## 2023-09-04 DIAGNOSIS — G311 Senile degeneration of brain, not elsewhere classified: Secondary | ICD-10-CM | POA: Diagnosis not present

## 2023-09-04 DIAGNOSIS — R296 Repeated falls: Secondary | ICD-10-CM | POA: Diagnosis not present

## 2023-09-04 DIAGNOSIS — I739 Peripheral vascular disease, unspecified: Secondary | ICD-10-CM | POA: Diagnosis not present

## 2023-09-04 DIAGNOSIS — L602 Onychogryphosis: Secondary | ICD-10-CM | POA: Diagnosis not present

## 2023-09-04 DIAGNOSIS — L603 Nail dystrophy: Secondary | ICD-10-CM | POA: Diagnosis not present

## 2023-09-04 DIAGNOSIS — Z6820 Body mass index (BMI) 20.0-20.9, adult: Secondary | ICD-10-CM | POA: Diagnosis not present

## 2023-09-04 DIAGNOSIS — E43 Unspecified severe protein-calorie malnutrition: Secondary | ICD-10-CM | POA: Diagnosis not present

## 2023-09-04 DIAGNOSIS — K219 Gastro-esophageal reflux disease without esophagitis: Secondary | ICD-10-CM | POA: Diagnosis not present

## 2023-09-04 DIAGNOSIS — L84 Corns and callosities: Secondary | ICD-10-CM | POA: Diagnosis not present

## 2023-09-04 DIAGNOSIS — Z8719 Personal history of other diseases of the digestive system: Secondary | ICD-10-CM | POA: Diagnosis not present

## 2023-09-07 DIAGNOSIS — Z6820 Body mass index (BMI) 20.0-20.9, adult: Secondary | ICD-10-CM | POA: Diagnosis not present

## 2023-09-07 DIAGNOSIS — G311 Senile degeneration of brain, not elsewhere classified: Secondary | ICD-10-CM | POA: Diagnosis not present

## 2023-09-07 DIAGNOSIS — E43 Unspecified severe protein-calorie malnutrition: Secondary | ICD-10-CM | POA: Diagnosis not present

## 2023-09-07 DIAGNOSIS — R296 Repeated falls: Secondary | ICD-10-CM | POA: Diagnosis not present

## 2023-09-07 DIAGNOSIS — K219 Gastro-esophageal reflux disease without esophagitis: Secondary | ICD-10-CM | POA: Diagnosis not present

## 2023-09-07 DIAGNOSIS — Z8719 Personal history of other diseases of the digestive system: Secondary | ICD-10-CM | POA: Diagnosis not present

## 2023-09-08 DIAGNOSIS — I4891 Unspecified atrial fibrillation: Secondary | ICD-10-CM | POA: Diagnosis not present

## 2023-09-08 DIAGNOSIS — M810 Age-related osteoporosis without current pathological fracture: Secondary | ICD-10-CM | POA: Diagnosis not present

## 2023-09-08 DIAGNOSIS — E785 Hyperlipidemia, unspecified: Secondary | ICD-10-CM | POA: Diagnosis not present

## 2023-09-08 DIAGNOSIS — F331 Major depressive disorder, recurrent, moderate: Secondary | ICD-10-CM | POA: Diagnosis not present

## 2023-09-08 DIAGNOSIS — D649 Anemia, unspecified: Secondary | ICD-10-CM | POA: Diagnosis not present

## 2023-09-08 DIAGNOSIS — F0390 Unspecified dementia without behavioral disturbance: Secondary | ICD-10-CM | POA: Diagnosis not present

## 2023-09-08 DIAGNOSIS — R569 Unspecified convulsions: Secondary | ICD-10-CM | POA: Diagnosis not present

## 2023-09-08 DIAGNOSIS — H409 Unspecified glaucoma: Secondary | ICD-10-CM | POA: Diagnosis not present

## 2023-09-08 DIAGNOSIS — I10 Essential (primary) hypertension: Secondary | ICD-10-CM | POA: Diagnosis not present

## 2023-09-08 DIAGNOSIS — R293 Abnormal posture: Secondary | ICD-10-CM | POA: Diagnosis not present

## 2023-09-09 DIAGNOSIS — E43 Unspecified severe protein-calorie malnutrition: Secondary | ICD-10-CM | POA: Diagnosis not present

## 2023-09-09 DIAGNOSIS — R296 Repeated falls: Secondary | ICD-10-CM | POA: Diagnosis not present

## 2023-09-09 DIAGNOSIS — K219 Gastro-esophageal reflux disease without esophagitis: Secondary | ICD-10-CM | POA: Diagnosis not present

## 2023-09-09 DIAGNOSIS — Z8719 Personal history of other diseases of the digestive system: Secondary | ICD-10-CM | POA: Diagnosis not present

## 2023-09-09 DIAGNOSIS — Z6820 Body mass index (BMI) 20.0-20.9, adult: Secondary | ICD-10-CM | POA: Diagnosis not present

## 2023-09-09 DIAGNOSIS — G311 Senile degeneration of brain, not elsewhere classified: Secondary | ICD-10-CM | POA: Diagnosis not present

## 2023-09-11 DIAGNOSIS — E43 Unspecified severe protein-calorie malnutrition: Secondary | ICD-10-CM | POA: Diagnosis not present

## 2023-09-11 DIAGNOSIS — I4891 Unspecified atrial fibrillation: Secondary | ICD-10-CM | POA: Diagnosis not present

## 2023-09-11 DIAGNOSIS — Z6820 Body mass index (BMI) 20.0-20.9, adult: Secondary | ICD-10-CM | POA: Diagnosis not present

## 2023-09-11 DIAGNOSIS — Z8719 Personal history of other diseases of the digestive system: Secondary | ICD-10-CM | POA: Diagnosis not present

## 2023-09-11 DIAGNOSIS — R296 Repeated falls: Secondary | ICD-10-CM | POA: Diagnosis not present

## 2023-09-11 DIAGNOSIS — Z87440 Personal history of urinary (tract) infections: Secondary | ICD-10-CM | POA: Diagnosis not present

## 2023-09-11 DIAGNOSIS — N1831 Chronic kidney disease, stage 3a: Secondary | ICD-10-CM | POA: Diagnosis not present

## 2023-09-11 DIAGNOSIS — K219 Gastro-esophageal reflux disease without esophagitis: Secondary | ICD-10-CM | POA: Diagnosis not present

## 2023-09-11 DIAGNOSIS — D631 Anemia in chronic kidney disease: Secondary | ICD-10-CM | POA: Diagnosis not present

## 2023-09-11 DIAGNOSIS — G311 Senile degeneration of brain, not elsewhere classified: Secondary | ICD-10-CM | POA: Diagnosis not present

## 2023-09-11 DIAGNOSIS — E785 Hyperlipidemia, unspecified: Secondary | ICD-10-CM | POA: Diagnosis not present

## 2023-09-11 DIAGNOSIS — I129 Hypertensive chronic kidney disease with stage 1 through stage 4 chronic kidney disease, or unspecified chronic kidney disease: Secondary | ICD-10-CM | POA: Diagnosis not present

## 2023-09-14 DIAGNOSIS — Z6820 Body mass index (BMI) 20.0-20.9, adult: Secondary | ICD-10-CM | POA: Diagnosis not present

## 2023-09-14 DIAGNOSIS — E43 Unspecified severe protein-calorie malnutrition: Secondary | ICD-10-CM | POA: Diagnosis not present

## 2023-09-14 DIAGNOSIS — K219 Gastro-esophageal reflux disease without esophagitis: Secondary | ICD-10-CM | POA: Diagnosis not present

## 2023-09-14 DIAGNOSIS — G311 Senile degeneration of brain, not elsewhere classified: Secondary | ICD-10-CM | POA: Diagnosis not present

## 2023-09-14 DIAGNOSIS — Z8719 Personal history of other diseases of the digestive system: Secondary | ICD-10-CM | POA: Diagnosis not present

## 2023-09-14 DIAGNOSIS — R296 Repeated falls: Secondary | ICD-10-CM | POA: Diagnosis not present

## 2023-09-15 DIAGNOSIS — H409 Unspecified glaucoma: Secondary | ICD-10-CM | POA: Diagnosis not present

## 2023-09-15 DIAGNOSIS — F331 Major depressive disorder, recurrent, moderate: Secondary | ICD-10-CM | POA: Diagnosis not present

## 2023-09-15 DIAGNOSIS — F0390 Unspecified dementia without behavioral disturbance: Secondary | ICD-10-CM | POA: Diagnosis not present

## 2023-09-15 DIAGNOSIS — R293 Abnormal posture: Secondary | ICD-10-CM | POA: Diagnosis not present

## 2023-09-15 DIAGNOSIS — E785 Hyperlipidemia, unspecified: Secondary | ICD-10-CM | POA: Diagnosis not present

## 2023-09-15 DIAGNOSIS — R569 Unspecified convulsions: Secondary | ICD-10-CM | POA: Diagnosis not present

## 2023-09-15 DIAGNOSIS — I4891 Unspecified atrial fibrillation: Secondary | ICD-10-CM | POA: Diagnosis not present

## 2023-09-15 DIAGNOSIS — I10 Essential (primary) hypertension: Secondary | ICD-10-CM | POA: Diagnosis not present

## 2023-09-15 DIAGNOSIS — D649 Anemia, unspecified: Secondary | ICD-10-CM | POA: Diagnosis not present

## 2023-09-15 DIAGNOSIS — M810 Age-related osteoporosis without current pathological fracture: Secondary | ICD-10-CM | POA: Diagnosis not present

## 2023-09-16 DIAGNOSIS — E43 Unspecified severe protein-calorie malnutrition: Secondary | ICD-10-CM | POA: Diagnosis not present

## 2023-09-16 DIAGNOSIS — G311 Senile degeneration of brain, not elsewhere classified: Secondary | ICD-10-CM | POA: Diagnosis not present

## 2023-09-16 DIAGNOSIS — K219 Gastro-esophageal reflux disease without esophagitis: Secondary | ICD-10-CM | POA: Diagnosis not present

## 2023-09-16 DIAGNOSIS — R296 Repeated falls: Secondary | ICD-10-CM | POA: Diagnosis not present

## 2023-09-16 DIAGNOSIS — Z8719 Personal history of other diseases of the digestive system: Secondary | ICD-10-CM | POA: Diagnosis not present

## 2023-09-16 DIAGNOSIS — Z6820 Body mass index (BMI) 20.0-20.9, adult: Secondary | ICD-10-CM | POA: Diagnosis not present

## 2023-09-18 DIAGNOSIS — E43 Unspecified severe protein-calorie malnutrition: Secondary | ICD-10-CM | POA: Diagnosis not present

## 2023-09-18 DIAGNOSIS — Z8719 Personal history of other diseases of the digestive system: Secondary | ICD-10-CM | POA: Diagnosis not present

## 2023-09-18 DIAGNOSIS — Z6820 Body mass index (BMI) 20.0-20.9, adult: Secondary | ICD-10-CM | POA: Diagnosis not present

## 2023-09-18 DIAGNOSIS — K219 Gastro-esophageal reflux disease without esophagitis: Secondary | ICD-10-CM | POA: Diagnosis not present

## 2023-09-18 DIAGNOSIS — R296 Repeated falls: Secondary | ICD-10-CM | POA: Diagnosis not present

## 2023-09-18 DIAGNOSIS — G311 Senile degeneration of brain, not elsewhere classified: Secondary | ICD-10-CM | POA: Diagnosis not present

## 2023-09-21 DIAGNOSIS — Z6820 Body mass index (BMI) 20.0-20.9, adult: Secondary | ICD-10-CM | POA: Diagnosis not present

## 2023-09-21 DIAGNOSIS — K219 Gastro-esophageal reflux disease without esophagitis: Secondary | ICD-10-CM | POA: Diagnosis not present

## 2023-09-21 DIAGNOSIS — Z8719 Personal history of other diseases of the digestive system: Secondary | ICD-10-CM | POA: Diagnosis not present

## 2023-09-21 DIAGNOSIS — G311 Senile degeneration of brain, not elsewhere classified: Secondary | ICD-10-CM | POA: Diagnosis not present

## 2023-09-21 DIAGNOSIS — R296 Repeated falls: Secondary | ICD-10-CM | POA: Diagnosis not present

## 2023-09-21 DIAGNOSIS — E43 Unspecified severe protein-calorie malnutrition: Secondary | ICD-10-CM | POA: Diagnosis not present

## 2023-09-23 DIAGNOSIS — R293 Abnormal posture: Secondary | ICD-10-CM | POA: Diagnosis not present

## 2023-09-23 DIAGNOSIS — I10 Essential (primary) hypertension: Secondary | ICD-10-CM | POA: Diagnosis not present

## 2023-09-23 DIAGNOSIS — Z6820 Body mass index (BMI) 20.0-20.9, adult: Secondary | ICD-10-CM | POA: Diagnosis not present

## 2023-09-23 DIAGNOSIS — K219 Gastro-esophageal reflux disease without esophagitis: Secondary | ICD-10-CM | POA: Diagnosis not present

## 2023-09-23 DIAGNOSIS — F331 Major depressive disorder, recurrent, moderate: Secondary | ICD-10-CM | POA: Diagnosis not present

## 2023-09-23 DIAGNOSIS — F0390 Unspecified dementia without behavioral disturbance: Secondary | ICD-10-CM | POA: Diagnosis not present

## 2023-09-23 DIAGNOSIS — R569 Unspecified convulsions: Secondary | ICD-10-CM | POA: Diagnosis not present

## 2023-09-23 DIAGNOSIS — I4891 Unspecified atrial fibrillation: Secondary | ICD-10-CM | POA: Diagnosis not present

## 2023-09-23 DIAGNOSIS — G311 Senile degeneration of brain, not elsewhere classified: Secondary | ICD-10-CM | POA: Diagnosis not present

## 2023-09-23 DIAGNOSIS — E43 Unspecified severe protein-calorie malnutrition: Secondary | ICD-10-CM | POA: Diagnosis not present

## 2023-09-23 DIAGNOSIS — M810 Age-related osteoporosis without current pathological fracture: Secondary | ICD-10-CM | POA: Diagnosis not present

## 2023-09-23 DIAGNOSIS — E785 Hyperlipidemia, unspecified: Secondary | ICD-10-CM | POA: Diagnosis not present

## 2023-09-23 DIAGNOSIS — Z8719 Personal history of other diseases of the digestive system: Secondary | ICD-10-CM | POA: Diagnosis not present

## 2023-09-23 DIAGNOSIS — H409 Unspecified glaucoma: Secondary | ICD-10-CM | POA: Diagnosis not present

## 2023-09-23 DIAGNOSIS — D649 Anemia, unspecified: Secondary | ICD-10-CM | POA: Diagnosis not present

## 2023-09-23 DIAGNOSIS — R296 Repeated falls: Secondary | ICD-10-CM | POA: Diagnosis not present

## 2023-09-25 DIAGNOSIS — Z6820 Body mass index (BMI) 20.0-20.9, adult: Secondary | ICD-10-CM | POA: Diagnosis not present

## 2023-09-25 DIAGNOSIS — G311 Senile degeneration of brain, not elsewhere classified: Secondary | ICD-10-CM | POA: Diagnosis not present

## 2023-09-25 DIAGNOSIS — Z8719 Personal history of other diseases of the digestive system: Secondary | ICD-10-CM | POA: Diagnosis not present

## 2023-09-25 DIAGNOSIS — R296 Repeated falls: Secondary | ICD-10-CM | POA: Diagnosis not present

## 2023-09-25 DIAGNOSIS — K219 Gastro-esophageal reflux disease without esophagitis: Secondary | ICD-10-CM | POA: Diagnosis not present

## 2023-09-25 DIAGNOSIS — E43 Unspecified severe protein-calorie malnutrition: Secondary | ICD-10-CM | POA: Diagnosis not present

## 2023-09-28 DIAGNOSIS — R296 Repeated falls: Secondary | ICD-10-CM | POA: Diagnosis not present

## 2023-09-28 DIAGNOSIS — Z8719 Personal history of other diseases of the digestive system: Secondary | ICD-10-CM | POA: Diagnosis not present

## 2023-09-28 DIAGNOSIS — K219 Gastro-esophageal reflux disease without esophagitis: Secondary | ICD-10-CM | POA: Diagnosis not present

## 2023-09-28 DIAGNOSIS — E43 Unspecified severe protein-calorie malnutrition: Secondary | ICD-10-CM | POA: Diagnosis not present

## 2023-09-28 DIAGNOSIS — G311 Senile degeneration of brain, not elsewhere classified: Secondary | ICD-10-CM | POA: Diagnosis not present

## 2023-09-28 DIAGNOSIS — Z6820 Body mass index (BMI) 20.0-20.9, adult: Secondary | ICD-10-CM | POA: Diagnosis not present

## 2023-09-29 DIAGNOSIS — K219 Gastro-esophageal reflux disease without esophagitis: Secondary | ICD-10-CM | POA: Diagnosis not present

## 2023-09-29 DIAGNOSIS — R293 Abnormal posture: Secondary | ICD-10-CM | POA: Diagnosis not present

## 2023-09-29 DIAGNOSIS — H409 Unspecified glaucoma: Secondary | ICD-10-CM | POA: Diagnosis not present

## 2023-09-29 DIAGNOSIS — Z8719 Personal history of other diseases of the digestive system: Secondary | ICD-10-CM | POA: Diagnosis not present

## 2023-09-29 DIAGNOSIS — R569 Unspecified convulsions: Secondary | ICD-10-CM | POA: Diagnosis not present

## 2023-09-29 DIAGNOSIS — E785 Hyperlipidemia, unspecified: Secondary | ICD-10-CM | POA: Diagnosis not present

## 2023-09-29 DIAGNOSIS — D649 Anemia, unspecified: Secondary | ICD-10-CM | POA: Diagnosis not present

## 2023-09-29 DIAGNOSIS — G311 Senile degeneration of brain, not elsewhere classified: Secondary | ICD-10-CM | POA: Diagnosis not present

## 2023-09-29 DIAGNOSIS — I4891 Unspecified atrial fibrillation: Secondary | ICD-10-CM | POA: Diagnosis not present

## 2023-09-29 DIAGNOSIS — I10 Essential (primary) hypertension: Secondary | ICD-10-CM | POA: Diagnosis not present

## 2023-09-29 DIAGNOSIS — E43 Unspecified severe protein-calorie malnutrition: Secondary | ICD-10-CM | POA: Diagnosis not present

## 2023-09-29 DIAGNOSIS — F331 Major depressive disorder, recurrent, moderate: Secondary | ICD-10-CM | POA: Diagnosis not present

## 2023-09-29 DIAGNOSIS — M810 Age-related osteoporosis without current pathological fracture: Secondary | ICD-10-CM | POA: Diagnosis not present

## 2023-09-29 DIAGNOSIS — Z6820 Body mass index (BMI) 20.0-20.9, adult: Secondary | ICD-10-CM | POA: Diagnosis not present

## 2023-09-29 DIAGNOSIS — F0390 Unspecified dementia without behavioral disturbance: Secondary | ICD-10-CM | POA: Diagnosis not present

## 2023-09-29 DIAGNOSIS — R296 Repeated falls: Secondary | ICD-10-CM | POA: Diagnosis not present

## 2023-09-30 DIAGNOSIS — K219 Gastro-esophageal reflux disease without esophagitis: Secondary | ICD-10-CM | POA: Diagnosis not present

## 2023-09-30 DIAGNOSIS — R296 Repeated falls: Secondary | ICD-10-CM | POA: Diagnosis not present

## 2023-09-30 DIAGNOSIS — G311 Senile degeneration of brain, not elsewhere classified: Secondary | ICD-10-CM | POA: Diagnosis not present

## 2023-09-30 DIAGNOSIS — Z6820 Body mass index (BMI) 20.0-20.9, adult: Secondary | ICD-10-CM | POA: Diagnosis not present

## 2023-09-30 DIAGNOSIS — J189 Pneumonia, unspecified organism: Secondary | ICD-10-CM | POA: Diagnosis not present

## 2023-09-30 DIAGNOSIS — E43 Unspecified severe protein-calorie malnutrition: Secondary | ICD-10-CM | POA: Diagnosis not present

## 2023-09-30 DIAGNOSIS — Z8719 Personal history of other diseases of the digestive system: Secondary | ICD-10-CM | POA: Diagnosis not present

## 2023-10-01 DIAGNOSIS — D649 Anemia, unspecified: Secondary | ICD-10-CM | POA: Diagnosis not present

## 2023-10-01 DIAGNOSIS — R569 Unspecified convulsions: Secondary | ICD-10-CM | POA: Diagnosis not present

## 2023-10-01 DIAGNOSIS — F0390 Unspecified dementia without behavioral disturbance: Secondary | ICD-10-CM | POA: Diagnosis not present

## 2023-10-01 DIAGNOSIS — I10 Essential (primary) hypertension: Secondary | ICD-10-CM | POA: Diagnosis not present

## 2023-10-01 DIAGNOSIS — H409 Unspecified glaucoma: Secondary | ICD-10-CM | POA: Diagnosis not present

## 2023-10-01 DIAGNOSIS — F331 Major depressive disorder, recurrent, moderate: Secondary | ICD-10-CM | POA: Diagnosis not present

## 2023-10-01 DIAGNOSIS — M810 Age-related osteoporosis without current pathological fracture: Secondary | ICD-10-CM | POA: Diagnosis not present

## 2023-10-01 DIAGNOSIS — E785 Hyperlipidemia, unspecified: Secondary | ICD-10-CM | POA: Diagnosis not present

## 2023-10-01 DIAGNOSIS — I4891 Unspecified atrial fibrillation: Secondary | ICD-10-CM | POA: Diagnosis not present

## 2023-10-01 DIAGNOSIS — R293 Abnormal posture: Secondary | ICD-10-CM | POA: Diagnosis not present

## 2023-10-02 DIAGNOSIS — Z8719 Personal history of other diseases of the digestive system: Secondary | ICD-10-CM | POA: Diagnosis not present

## 2023-10-02 DIAGNOSIS — E43 Unspecified severe protein-calorie malnutrition: Secondary | ICD-10-CM | POA: Diagnosis not present

## 2023-10-02 DIAGNOSIS — G311 Senile degeneration of brain, not elsewhere classified: Secondary | ICD-10-CM | POA: Diagnosis not present

## 2023-10-02 DIAGNOSIS — R296 Repeated falls: Secondary | ICD-10-CM | POA: Diagnosis not present

## 2023-10-02 DIAGNOSIS — Z6820 Body mass index (BMI) 20.0-20.9, adult: Secondary | ICD-10-CM | POA: Diagnosis not present

## 2023-10-02 DIAGNOSIS — K219 Gastro-esophageal reflux disease without esophagitis: Secondary | ICD-10-CM | POA: Diagnosis not present

## 2023-10-05 DIAGNOSIS — R293 Abnormal posture: Secondary | ICD-10-CM | POA: Diagnosis not present

## 2023-10-05 DIAGNOSIS — I10 Essential (primary) hypertension: Secondary | ICD-10-CM | POA: Diagnosis not present

## 2023-10-05 DIAGNOSIS — D649 Anemia, unspecified: Secondary | ICD-10-CM | POA: Diagnosis not present

## 2023-10-05 DIAGNOSIS — F331 Major depressive disorder, recurrent, moderate: Secondary | ICD-10-CM | POA: Diagnosis not present

## 2023-10-05 DIAGNOSIS — F0390 Unspecified dementia without behavioral disturbance: Secondary | ICD-10-CM | POA: Diagnosis not present

## 2023-10-05 DIAGNOSIS — I4891 Unspecified atrial fibrillation: Secondary | ICD-10-CM | POA: Diagnosis not present

## 2023-10-05 DIAGNOSIS — R569 Unspecified convulsions: Secondary | ICD-10-CM | POA: Diagnosis not present

## 2023-10-05 DIAGNOSIS — E785 Hyperlipidemia, unspecified: Secondary | ICD-10-CM | POA: Diagnosis not present

## 2023-10-05 DIAGNOSIS — M810 Age-related osteoporosis without current pathological fracture: Secondary | ICD-10-CM | POA: Diagnosis not present

## 2023-10-05 DIAGNOSIS — H409 Unspecified glaucoma: Secondary | ICD-10-CM | POA: Diagnosis not present

## 2023-10-06 DIAGNOSIS — Z8719 Personal history of other diseases of the digestive system: Secondary | ICD-10-CM | POA: Diagnosis not present

## 2023-10-06 DIAGNOSIS — Z6820 Body mass index (BMI) 20.0-20.9, adult: Secondary | ICD-10-CM | POA: Diagnosis not present

## 2023-10-06 DIAGNOSIS — R296 Repeated falls: Secondary | ICD-10-CM | POA: Diagnosis not present

## 2023-10-06 DIAGNOSIS — G311 Senile degeneration of brain, not elsewhere classified: Secondary | ICD-10-CM | POA: Diagnosis not present

## 2023-10-06 DIAGNOSIS — E43 Unspecified severe protein-calorie malnutrition: Secondary | ICD-10-CM | POA: Diagnosis not present

## 2023-10-06 DIAGNOSIS — K219 Gastro-esophageal reflux disease without esophagitis: Secondary | ICD-10-CM | POA: Diagnosis not present

## 2023-10-07 DIAGNOSIS — E43 Unspecified severe protein-calorie malnutrition: Secondary | ICD-10-CM | POA: Diagnosis not present

## 2023-10-07 DIAGNOSIS — K219 Gastro-esophageal reflux disease without esophagitis: Secondary | ICD-10-CM | POA: Diagnosis not present

## 2023-10-07 DIAGNOSIS — R296 Repeated falls: Secondary | ICD-10-CM | POA: Diagnosis not present

## 2023-10-07 DIAGNOSIS — G311 Senile degeneration of brain, not elsewhere classified: Secondary | ICD-10-CM | POA: Diagnosis not present

## 2023-10-07 DIAGNOSIS — Z8719 Personal history of other diseases of the digestive system: Secondary | ICD-10-CM | POA: Diagnosis not present

## 2023-10-07 DIAGNOSIS — Z6820 Body mass index (BMI) 20.0-20.9, adult: Secondary | ICD-10-CM | POA: Diagnosis not present

## 2023-10-09 DIAGNOSIS — Z6820 Body mass index (BMI) 20.0-20.9, adult: Secondary | ICD-10-CM | POA: Diagnosis not present

## 2023-10-09 DIAGNOSIS — R296 Repeated falls: Secondary | ICD-10-CM | POA: Diagnosis not present

## 2023-10-09 DIAGNOSIS — E43 Unspecified severe protein-calorie malnutrition: Secondary | ICD-10-CM | POA: Diagnosis not present

## 2023-10-09 DIAGNOSIS — G311 Senile degeneration of brain, not elsewhere classified: Secondary | ICD-10-CM | POA: Diagnosis not present

## 2023-10-09 DIAGNOSIS — Z8719 Personal history of other diseases of the digestive system: Secondary | ICD-10-CM | POA: Diagnosis not present

## 2023-10-09 DIAGNOSIS — K219 Gastro-esophageal reflux disease without esophagitis: Secondary | ICD-10-CM | POA: Diagnosis not present

## 2023-10-11 DIAGNOSIS — G311 Senile degeneration of brain, not elsewhere classified: Secondary | ICD-10-CM | POA: Diagnosis not present

## 2023-10-11 DIAGNOSIS — K219 Gastro-esophageal reflux disease without esophagitis: Secondary | ICD-10-CM | POA: Diagnosis not present

## 2023-10-11 DIAGNOSIS — D631 Anemia in chronic kidney disease: Secondary | ICD-10-CM | POA: Diagnosis not present

## 2023-10-11 DIAGNOSIS — Z6820 Body mass index (BMI) 20.0-20.9, adult: Secondary | ICD-10-CM | POA: Diagnosis not present

## 2023-10-11 DIAGNOSIS — Z87440 Personal history of urinary (tract) infections: Secondary | ICD-10-CM | POA: Diagnosis not present

## 2023-10-11 DIAGNOSIS — I129 Hypertensive chronic kidney disease with stage 1 through stage 4 chronic kidney disease, or unspecified chronic kidney disease: Secondary | ICD-10-CM | POA: Diagnosis not present

## 2023-10-11 DIAGNOSIS — Z8719 Personal history of other diseases of the digestive system: Secondary | ICD-10-CM | POA: Diagnosis not present

## 2023-10-11 DIAGNOSIS — E43 Unspecified severe protein-calorie malnutrition: Secondary | ICD-10-CM | POA: Diagnosis not present

## 2023-10-11 DIAGNOSIS — E785 Hyperlipidemia, unspecified: Secondary | ICD-10-CM | POA: Diagnosis not present

## 2023-10-11 DIAGNOSIS — I4891 Unspecified atrial fibrillation: Secondary | ICD-10-CM | POA: Diagnosis not present

## 2023-10-11 DIAGNOSIS — R296 Repeated falls: Secondary | ICD-10-CM | POA: Diagnosis not present

## 2023-10-11 DIAGNOSIS — N1831 Chronic kidney disease, stage 3a: Secondary | ICD-10-CM | POA: Diagnosis not present

## 2023-10-12 DIAGNOSIS — R569 Unspecified convulsions: Secondary | ICD-10-CM | POA: Diagnosis not present

## 2023-10-12 DIAGNOSIS — I10 Essential (primary) hypertension: Secondary | ICD-10-CM | POA: Diagnosis not present

## 2023-10-12 DIAGNOSIS — F331 Major depressive disorder, recurrent, moderate: Secondary | ICD-10-CM | POA: Diagnosis not present

## 2023-10-12 DIAGNOSIS — G311 Senile degeneration of brain, not elsewhere classified: Secondary | ICD-10-CM | POA: Diagnosis not present

## 2023-10-12 DIAGNOSIS — H409 Unspecified glaucoma: Secondary | ICD-10-CM | POA: Diagnosis not present

## 2023-10-12 DIAGNOSIS — E43 Unspecified severe protein-calorie malnutrition: Secondary | ICD-10-CM | POA: Diagnosis not present

## 2023-10-12 DIAGNOSIS — K219 Gastro-esophageal reflux disease without esophagitis: Secondary | ICD-10-CM | POA: Diagnosis not present

## 2023-10-12 DIAGNOSIS — Z8719 Personal history of other diseases of the digestive system: Secondary | ICD-10-CM | POA: Diagnosis not present

## 2023-10-12 DIAGNOSIS — R296 Repeated falls: Secondary | ICD-10-CM | POA: Diagnosis not present

## 2023-10-12 DIAGNOSIS — D649 Anemia, unspecified: Secondary | ICD-10-CM | POA: Diagnosis not present

## 2023-10-12 DIAGNOSIS — I4891 Unspecified atrial fibrillation: Secondary | ICD-10-CM | POA: Diagnosis not present

## 2023-10-12 DIAGNOSIS — E785 Hyperlipidemia, unspecified: Secondary | ICD-10-CM | POA: Diagnosis not present

## 2023-10-12 DIAGNOSIS — R293 Abnormal posture: Secondary | ICD-10-CM | POA: Diagnosis not present

## 2023-10-12 DIAGNOSIS — F0390 Unspecified dementia without behavioral disturbance: Secondary | ICD-10-CM | POA: Diagnosis not present

## 2023-10-12 DIAGNOSIS — Z6820 Body mass index (BMI) 20.0-20.9, adult: Secondary | ICD-10-CM | POA: Diagnosis not present

## 2023-10-12 DIAGNOSIS — M810 Age-related osteoporosis without current pathological fracture: Secondary | ICD-10-CM | POA: Diagnosis not present

## 2023-10-13 DIAGNOSIS — Z6820 Body mass index (BMI) 20.0-20.9, adult: Secondary | ICD-10-CM | POA: Diagnosis not present

## 2023-10-13 DIAGNOSIS — I10 Essential (primary) hypertension: Secondary | ICD-10-CM | POA: Diagnosis not present

## 2023-10-13 DIAGNOSIS — M810 Age-related osteoporosis without current pathological fracture: Secondary | ICD-10-CM | POA: Diagnosis not present

## 2023-10-13 DIAGNOSIS — G311 Senile degeneration of brain, not elsewhere classified: Secondary | ICD-10-CM | POA: Diagnosis not present

## 2023-10-13 DIAGNOSIS — D649 Anemia, unspecified: Secondary | ICD-10-CM | POA: Diagnosis not present

## 2023-10-13 DIAGNOSIS — R569 Unspecified convulsions: Secondary | ICD-10-CM | POA: Diagnosis not present

## 2023-10-13 DIAGNOSIS — Z8719 Personal history of other diseases of the digestive system: Secondary | ICD-10-CM | POA: Diagnosis not present

## 2023-10-13 DIAGNOSIS — E43 Unspecified severe protein-calorie malnutrition: Secondary | ICD-10-CM | POA: Diagnosis not present

## 2023-10-13 DIAGNOSIS — F0390 Unspecified dementia without behavioral disturbance: Secondary | ICD-10-CM | POA: Diagnosis not present

## 2023-10-13 DIAGNOSIS — R293 Abnormal posture: Secondary | ICD-10-CM | POA: Diagnosis not present

## 2023-10-13 DIAGNOSIS — K219 Gastro-esophageal reflux disease without esophagitis: Secondary | ICD-10-CM | POA: Diagnosis not present

## 2023-10-13 DIAGNOSIS — R296 Repeated falls: Secondary | ICD-10-CM | POA: Diagnosis not present

## 2023-10-13 DIAGNOSIS — F331 Major depressive disorder, recurrent, moderate: Secondary | ICD-10-CM | POA: Diagnosis not present

## 2023-10-13 DIAGNOSIS — E785 Hyperlipidemia, unspecified: Secondary | ICD-10-CM | POA: Diagnosis not present

## 2023-10-13 DIAGNOSIS — I4891 Unspecified atrial fibrillation: Secondary | ICD-10-CM | POA: Diagnosis not present

## 2023-10-13 DIAGNOSIS — H409 Unspecified glaucoma: Secondary | ICD-10-CM | POA: Diagnosis not present

## 2023-10-14 DIAGNOSIS — G311 Senile degeneration of brain, not elsewhere classified: Secondary | ICD-10-CM | POA: Diagnosis not present

## 2023-10-14 DIAGNOSIS — R296 Repeated falls: Secondary | ICD-10-CM | POA: Diagnosis not present

## 2023-10-14 DIAGNOSIS — E43 Unspecified severe protein-calorie malnutrition: Secondary | ICD-10-CM | POA: Diagnosis not present

## 2023-10-14 DIAGNOSIS — Z6820 Body mass index (BMI) 20.0-20.9, adult: Secondary | ICD-10-CM | POA: Diagnosis not present

## 2023-10-14 DIAGNOSIS — K219 Gastro-esophageal reflux disease without esophagitis: Secondary | ICD-10-CM | POA: Diagnosis not present

## 2023-10-14 DIAGNOSIS — Z8719 Personal history of other diseases of the digestive system: Secondary | ICD-10-CM | POA: Diagnosis not present

## 2023-10-16 DIAGNOSIS — E43 Unspecified severe protein-calorie malnutrition: Secondary | ICD-10-CM | POA: Diagnosis not present

## 2023-10-16 DIAGNOSIS — R296 Repeated falls: Secondary | ICD-10-CM | POA: Diagnosis not present

## 2023-10-16 DIAGNOSIS — Z8719 Personal history of other diseases of the digestive system: Secondary | ICD-10-CM | POA: Diagnosis not present

## 2023-10-16 DIAGNOSIS — Z6820 Body mass index (BMI) 20.0-20.9, adult: Secondary | ICD-10-CM | POA: Diagnosis not present

## 2023-10-16 DIAGNOSIS — K219 Gastro-esophageal reflux disease without esophagitis: Secondary | ICD-10-CM | POA: Diagnosis not present

## 2023-10-16 DIAGNOSIS — G311 Senile degeneration of brain, not elsewhere classified: Secondary | ICD-10-CM | POA: Diagnosis not present

## 2023-10-19 DIAGNOSIS — G311 Senile degeneration of brain, not elsewhere classified: Secondary | ICD-10-CM | POA: Diagnosis not present

## 2023-10-19 DIAGNOSIS — Z6820 Body mass index (BMI) 20.0-20.9, adult: Secondary | ICD-10-CM | POA: Diagnosis not present

## 2023-10-19 DIAGNOSIS — K219 Gastro-esophageal reflux disease without esophagitis: Secondary | ICD-10-CM | POA: Diagnosis not present

## 2023-10-19 DIAGNOSIS — Z8719 Personal history of other diseases of the digestive system: Secondary | ICD-10-CM | POA: Diagnosis not present

## 2023-10-19 DIAGNOSIS — E43 Unspecified severe protein-calorie malnutrition: Secondary | ICD-10-CM | POA: Diagnosis not present

## 2023-10-19 DIAGNOSIS — R296 Repeated falls: Secondary | ICD-10-CM | POA: Diagnosis not present

## 2023-10-20 DIAGNOSIS — E785 Hyperlipidemia, unspecified: Secondary | ICD-10-CM | POA: Diagnosis not present

## 2023-10-20 DIAGNOSIS — F331 Major depressive disorder, recurrent, moderate: Secondary | ICD-10-CM | POA: Diagnosis not present

## 2023-10-20 DIAGNOSIS — E43 Unspecified severe protein-calorie malnutrition: Secondary | ICD-10-CM | POA: Diagnosis not present

## 2023-10-20 DIAGNOSIS — R569 Unspecified convulsions: Secondary | ICD-10-CM | POA: Diagnosis not present

## 2023-10-20 DIAGNOSIS — R293 Abnormal posture: Secondary | ICD-10-CM | POA: Diagnosis not present

## 2023-10-20 DIAGNOSIS — I10 Essential (primary) hypertension: Secondary | ICD-10-CM | POA: Diagnosis not present

## 2023-10-20 DIAGNOSIS — R296 Repeated falls: Secondary | ICD-10-CM | POA: Diagnosis not present

## 2023-10-20 DIAGNOSIS — Z8719 Personal history of other diseases of the digestive system: Secondary | ICD-10-CM | POA: Diagnosis not present

## 2023-10-20 DIAGNOSIS — F0390 Unspecified dementia without behavioral disturbance: Secondary | ICD-10-CM | POA: Diagnosis not present

## 2023-10-20 DIAGNOSIS — H409 Unspecified glaucoma: Secondary | ICD-10-CM | POA: Diagnosis not present

## 2023-10-20 DIAGNOSIS — M810 Age-related osteoporosis without current pathological fracture: Secondary | ICD-10-CM | POA: Diagnosis not present

## 2023-10-20 DIAGNOSIS — K219 Gastro-esophageal reflux disease without esophagitis: Secondary | ICD-10-CM | POA: Diagnosis not present

## 2023-10-20 DIAGNOSIS — Z6820 Body mass index (BMI) 20.0-20.9, adult: Secondary | ICD-10-CM | POA: Diagnosis not present

## 2023-10-20 DIAGNOSIS — G311 Senile degeneration of brain, not elsewhere classified: Secondary | ICD-10-CM | POA: Diagnosis not present

## 2023-10-20 DIAGNOSIS — D649 Anemia, unspecified: Secondary | ICD-10-CM | POA: Diagnosis not present

## 2023-10-20 DIAGNOSIS — I4891 Unspecified atrial fibrillation: Secondary | ICD-10-CM | POA: Diagnosis not present

## 2023-10-21 DIAGNOSIS — R296 Repeated falls: Secondary | ICD-10-CM | POA: Diagnosis not present

## 2023-10-21 DIAGNOSIS — Z6820 Body mass index (BMI) 20.0-20.9, adult: Secondary | ICD-10-CM | POA: Diagnosis not present

## 2023-10-21 DIAGNOSIS — K219 Gastro-esophageal reflux disease without esophagitis: Secondary | ICD-10-CM | POA: Diagnosis not present

## 2023-10-21 DIAGNOSIS — G311 Senile degeneration of brain, not elsewhere classified: Secondary | ICD-10-CM | POA: Diagnosis not present

## 2023-10-21 DIAGNOSIS — E43 Unspecified severe protein-calorie malnutrition: Secondary | ICD-10-CM | POA: Diagnosis not present

## 2023-10-21 DIAGNOSIS — Z8719 Personal history of other diseases of the digestive system: Secondary | ICD-10-CM | POA: Diagnosis not present

## 2023-10-23 DIAGNOSIS — Z8719 Personal history of other diseases of the digestive system: Secondary | ICD-10-CM | POA: Diagnosis not present

## 2023-10-23 DIAGNOSIS — R296 Repeated falls: Secondary | ICD-10-CM | POA: Diagnosis not present

## 2023-10-23 DIAGNOSIS — E43 Unspecified severe protein-calorie malnutrition: Secondary | ICD-10-CM | POA: Diagnosis not present

## 2023-10-23 DIAGNOSIS — G311 Senile degeneration of brain, not elsewhere classified: Secondary | ICD-10-CM | POA: Diagnosis not present

## 2023-10-23 DIAGNOSIS — K219 Gastro-esophageal reflux disease without esophagitis: Secondary | ICD-10-CM | POA: Diagnosis not present

## 2023-10-23 DIAGNOSIS — Z6820 Body mass index (BMI) 20.0-20.9, adult: Secondary | ICD-10-CM | POA: Diagnosis not present

## 2023-10-26 DIAGNOSIS — G311 Senile degeneration of brain, not elsewhere classified: Secondary | ICD-10-CM | POA: Diagnosis not present

## 2023-10-26 DIAGNOSIS — K219 Gastro-esophageal reflux disease without esophagitis: Secondary | ICD-10-CM | POA: Diagnosis not present

## 2023-10-26 DIAGNOSIS — E43 Unspecified severe protein-calorie malnutrition: Secondary | ICD-10-CM | POA: Diagnosis not present

## 2023-10-26 DIAGNOSIS — R296 Repeated falls: Secondary | ICD-10-CM | POA: Diagnosis not present

## 2023-10-26 DIAGNOSIS — Z8719 Personal history of other diseases of the digestive system: Secondary | ICD-10-CM | POA: Diagnosis not present

## 2023-10-26 DIAGNOSIS — Z6820 Body mass index (BMI) 20.0-20.9, adult: Secondary | ICD-10-CM | POA: Diagnosis not present

## 2023-10-27 DIAGNOSIS — Z6820 Body mass index (BMI) 20.0-20.9, adult: Secondary | ICD-10-CM | POA: Diagnosis not present

## 2023-10-27 DIAGNOSIS — K219 Gastro-esophageal reflux disease without esophagitis: Secondary | ICD-10-CM | POA: Diagnosis not present

## 2023-10-27 DIAGNOSIS — Z8719 Personal history of other diseases of the digestive system: Secondary | ICD-10-CM | POA: Diagnosis not present

## 2023-10-27 DIAGNOSIS — E43 Unspecified severe protein-calorie malnutrition: Secondary | ICD-10-CM | POA: Diagnosis not present

## 2023-10-27 DIAGNOSIS — G311 Senile degeneration of brain, not elsewhere classified: Secondary | ICD-10-CM | POA: Diagnosis not present

## 2023-10-27 DIAGNOSIS — R296 Repeated falls: Secondary | ICD-10-CM | POA: Diagnosis not present

## 2023-10-28 DIAGNOSIS — Z6820 Body mass index (BMI) 20.0-20.9, adult: Secondary | ICD-10-CM | POA: Diagnosis not present

## 2023-10-28 DIAGNOSIS — Z8719 Personal history of other diseases of the digestive system: Secondary | ICD-10-CM | POA: Diagnosis not present

## 2023-10-28 DIAGNOSIS — R296 Repeated falls: Secondary | ICD-10-CM | POA: Diagnosis not present

## 2023-10-28 DIAGNOSIS — K219 Gastro-esophageal reflux disease without esophagitis: Secondary | ICD-10-CM | POA: Diagnosis not present

## 2023-10-28 DIAGNOSIS — E43 Unspecified severe protein-calorie malnutrition: Secondary | ICD-10-CM | POA: Diagnosis not present

## 2023-10-28 DIAGNOSIS — G311 Senile degeneration of brain, not elsewhere classified: Secondary | ICD-10-CM | POA: Diagnosis not present

## 2023-10-30 DIAGNOSIS — E43 Unspecified severe protein-calorie malnutrition: Secondary | ICD-10-CM | POA: Diagnosis not present

## 2023-10-30 DIAGNOSIS — Z6820 Body mass index (BMI) 20.0-20.9, adult: Secondary | ICD-10-CM | POA: Diagnosis not present

## 2023-10-30 DIAGNOSIS — Z8719 Personal history of other diseases of the digestive system: Secondary | ICD-10-CM | POA: Diagnosis not present

## 2023-10-30 DIAGNOSIS — R296 Repeated falls: Secondary | ICD-10-CM | POA: Diagnosis not present

## 2023-10-30 DIAGNOSIS — G311 Senile degeneration of brain, not elsewhere classified: Secondary | ICD-10-CM | POA: Diagnosis not present

## 2023-10-30 DIAGNOSIS — K219 Gastro-esophageal reflux disease without esophagitis: Secondary | ICD-10-CM | POA: Diagnosis not present

## 2023-11-02 DIAGNOSIS — E43 Unspecified severe protein-calorie malnutrition: Secondary | ICD-10-CM | POA: Diagnosis not present

## 2023-11-02 DIAGNOSIS — Z6820 Body mass index (BMI) 20.0-20.9, adult: Secondary | ICD-10-CM | POA: Diagnosis not present

## 2023-11-02 DIAGNOSIS — R296 Repeated falls: Secondary | ICD-10-CM | POA: Diagnosis not present

## 2023-11-02 DIAGNOSIS — G311 Senile degeneration of brain, not elsewhere classified: Secondary | ICD-10-CM | POA: Diagnosis not present

## 2023-11-02 DIAGNOSIS — Z8719 Personal history of other diseases of the digestive system: Secondary | ICD-10-CM | POA: Diagnosis not present

## 2023-11-02 DIAGNOSIS — K219 Gastro-esophageal reflux disease without esophagitis: Secondary | ICD-10-CM | POA: Diagnosis not present

## 2023-11-03 DIAGNOSIS — Z6820 Body mass index (BMI) 20.0-20.9, adult: Secondary | ICD-10-CM | POA: Diagnosis not present

## 2023-11-03 DIAGNOSIS — E43 Unspecified severe protein-calorie malnutrition: Secondary | ICD-10-CM | POA: Diagnosis not present

## 2023-11-03 DIAGNOSIS — Z8719 Personal history of other diseases of the digestive system: Secondary | ICD-10-CM | POA: Diagnosis not present

## 2023-11-03 DIAGNOSIS — R296 Repeated falls: Secondary | ICD-10-CM | POA: Diagnosis not present

## 2023-11-03 DIAGNOSIS — G311 Senile degeneration of brain, not elsewhere classified: Secondary | ICD-10-CM | POA: Diagnosis not present

## 2023-11-03 DIAGNOSIS — K219 Gastro-esophageal reflux disease without esophagitis: Secondary | ICD-10-CM | POA: Diagnosis not present

## 2023-11-05 DIAGNOSIS — G311 Senile degeneration of brain, not elsewhere classified: Secondary | ICD-10-CM | POA: Diagnosis not present

## 2023-11-05 DIAGNOSIS — Z8719 Personal history of other diseases of the digestive system: Secondary | ICD-10-CM | POA: Diagnosis not present

## 2023-11-05 DIAGNOSIS — K219 Gastro-esophageal reflux disease without esophagitis: Secondary | ICD-10-CM | POA: Diagnosis not present

## 2023-11-05 DIAGNOSIS — Z6820 Body mass index (BMI) 20.0-20.9, adult: Secondary | ICD-10-CM | POA: Diagnosis not present

## 2023-11-05 DIAGNOSIS — E43 Unspecified severe protein-calorie malnutrition: Secondary | ICD-10-CM | POA: Diagnosis not present

## 2023-11-05 DIAGNOSIS — R296 Repeated falls: Secondary | ICD-10-CM | POA: Diagnosis not present

## 2023-11-06 DIAGNOSIS — Z6820 Body mass index (BMI) 20.0-20.9, adult: Secondary | ICD-10-CM | POA: Diagnosis not present

## 2023-11-06 DIAGNOSIS — G311 Senile degeneration of brain, not elsewhere classified: Secondary | ICD-10-CM | POA: Diagnosis not present

## 2023-11-06 DIAGNOSIS — Z8719 Personal history of other diseases of the digestive system: Secondary | ICD-10-CM | POA: Diagnosis not present

## 2023-11-06 DIAGNOSIS — R296 Repeated falls: Secondary | ICD-10-CM | POA: Diagnosis not present

## 2023-11-06 DIAGNOSIS — K219 Gastro-esophageal reflux disease without esophagitis: Secondary | ICD-10-CM | POA: Diagnosis not present

## 2023-11-06 DIAGNOSIS — E43 Unspecified severe protein-calorie malnutrition: Secondary | ICD-10-CM | POA: Diagnosis not present

## 2023-11-07 DIAGNOSIS — D649 Anemia, unspecified: Secondary | ICD-10-CM | POA: Diagnosis not present

## 2023-11-07 DIAGNOSIS — R569 Unspecified convulsions: Secondary | ICD-10-CM | POA: Diagnosis not present

## 2023-11-07 DIAGNOSIS — F0390 Unspecified dementia without behavioral disturbance: Secondary | ICD-10-CM | POA: Diagnosis not present

## 2023-11-07 DIAGNOSIS — I10 Essential (primary) hypertension: Secondary | ICD-10-CM | POA: Diagnosis not present

## 2023-11-07 DIAGNOSIS — R293 Abnormal posture: Secondary | ICD-10-CM | POA: Diagnosis not present

## 2023-11-07 DIAGNOSIS — E785 Hyperlipidemia, unspecified: Secondary | ICD-10-CM | POA: Diagnosis not present

## 2023-11-07 DIAGNOSIS — F331 Major depressive disorder, recurrent, moderate: Secondary | ICD-10-CM | POA: Diagnosis not present

## 2023-11-07 DIAGNOSIS — M810 Age-related osteoporosis without current pathological fracture: Secondary | ICD-10-CM | POA: Diagnosis not present

## 2023-11-07 DIAGNOSIS — H409 Unspecified glaucoma: Secondary | ICD-10-CM | POA: Diagnosis not present

## 2023-11-07 DIAGNOSIS — I4891 Unspecified atrial fibrillation: Secondary | ICD-10-CM | POA: Diagnosis not present

## 2023-11-08 DIAGNOSIS — G311 Senile degeneration of brain, not elsewhere classified: Secondary | ICD-10-CM | POA: Diagnosis not present

## 2023-11-08 DIAGNOSIS — Z6820 Body mass index (BMI) 20.0-20.9, adult: Secondary | ICD-10-CM | POA: Diagnosis not present

## 2023-11-08 DIAGNOSIS — E43 Unspecified severe protein-calorie malnutrition: Secondary | ICD-10-CM | POA: Diagnosis not present

## 2023-11-08 DIAGNOSIS — Z8719 Personal history of other diseases of the digestive system: Secondary | ICD-10-CM | POA: Diagnosis not present

## 2023-11-08 DIAGNOSIS — R296 Repeated falls: Secondary | ICD-10-CM | POA: Diagnosis not present

## 2023-11-08 DIAGNOSIS — K219 Gastro-esophageal reflux disease without esophagitis: Secondary | ICD-10-CM | POA: Diagnosis not present

## 2023-11-09 DIAGNOSIS — E43 Unspecified severe protein-calorie malnutrition: Secondary | ICD-10-CM | POA: Diagnosis not present

## 2023-11-09 DIAGNOSIS — G311 Senile degeneration of brain, not elsewhere classified: Secondary | ICD-10-CM | POA: Diagnosis not present

## 2023-11-09 DIAGNOSIS — Z6820 Body mass index (BMI) 20.0-20.9, adult: Secondary | ICD-10-CM | POA: Diagnosis not present

## 2023-11-09 DIAGNOSIS — Z8719 Personal history of other diseases of the digestive system: Secondary | ICD-10-CM | POA: Diagnosis not present

## 2023-11-09 DIAGNOSIS — K219 Gastro-esophageal reflux disease without esophagitis: Secondary | ICD-10-CM | POA: Diagnosis not present

## 2023-11-09 DIAGNOSIS — R296 Repeated falls: Secondary | ICD-10-CM | POA: Diagnosis not present

## 2023-11-10 DIAGNOSIS — Z8719 Personal history of other diseases of the digestive system: Secondary | ICD-10-CM | POA: Diagnosis not present

## 2023-11-10 DIAGNOSIS — G311 Senile degeneration of brain, not elsewhere classified: Secondary | ICD-10-CM | POA: Diagnosis not present

## 2023-11-10 DIAGNOSIS — E43 Unspecified severe protein-calorie malnutrition: Secondary | ICD-10-CM | POA: Diagnosis not present

## 2023-11-10 DIAGNOSIS — K219 Gastro-esophageal reflux disease without esophagitis: Secondary | ICD-10-CM | POA: Diagnosis not present

## 2023-11-10 DIAGNOSIS — R296 Repeated falls: Secondary | ICD-10-CM | POA: Diagnosis not present

## 2023-11-10 DIAGNOSIS — Z6820 Body mass index (BMI) 20.0-20.9, adult: Secondary | ICD-10-CM | POA: Diagnosis not present

## 2023-11-11 DIAGNOSIS — I129 Hypertensive chronic kidney disease with stage 1 through stage 4 chronic kidney disease, or unspecified chronic kidney disease: Secondary | ICD-10-CM | POA: Diagnosis not present

## 2023-11-11 DIAGNOSIS — E785 Hyperlipidemia, unspecified: Secondary | ICD-10-CM | POA: Diagnosis not present

## 2023-11-11 DIAGNOSIS — K219 Gastro-esophageal reflux disease without esophagitis: Secondary | ICD-10-CM | POA: Diagnosis not present

## 2023-11-11 DIAGNOSIS — Z8719 Personal history of other diseases of the digestive system: Secondary | ICD-10-CM | POA: Diagnosis not present

## 2023-11-11 DIAGNOSIS — E43 Unspecified severe protein-calorie malnutrition: Secondary | ICD-10-CM | POA: Diagnosis not present

## 2023-11-11 DIAGNOSIS — G311 Senile degeneration of brain, not elsewhere classified: Secondary | ICD-10-CM | POA: Diagnosis not present

## 2023-11-11 DIAGNOSIS — I4891 Unspecified atrial fibrillation: Secondary | ICD-10-CM | POA: Diagnosis not present

## 2023-11-11 DIAGNOSIS — Z87440 Personal history of urinary (tract) infections: Secondary | ICD-10-CM | POA: Diagnosis not present

## 2023-11-11 DIAGNOSIS — R296 Repeated falls: Secondary | ICD-10-CM | POA: Diagnosis not present

## 2023-11-11 DIAGNOSIS — Z6820 Body mass index (BMI) 20.0-20.9, adult: Secondary | ICD-10-CM | POA: Diagnosis not present

## 2023-11-11 DIAGNOSIS — N1831 Chronic kidney disease, stage 3a: Secondary | ICD-10-CM | POA: Diagnosis not present

## 2023-11-11 DIAGNOSIS — D631 Anemia in chronic kidney disease: Secondary | ICD-10-CM | POA: Diagnosis not present

## 2023-11-13 DIAGNOSIS — K219 Gastro-esophageal reflux disease without esophagitis: Secondary | ICD-10-CM | POA: Diagnosis not present

## 2023-11-13 DIAGNOSIS — G311 Senile degeneration of brain, not elsewhere classified: Secondary | ICD-10-CM | POA: Diagnosis not present

## 2023-11-13 DIAGNOSIS — Z8719 Personal history of other diseases of the digestive system: Secondary | ICD-10-CM | POA: Diagnosis not present

## 2023-11-13 DIAGNOSIS — R296 Repeated falls: Secondary | ICD-10-CM | POA: Diagnosis not present

## 2023-11-13 DIAGNOSIS — E43 Unspecified severe protein-calorie malnutrition: Secondary | ICD-10-CM | POA: Diagnosis not present

## 2023-11-13 DIAGNOSIS — Z6820 Body mass index (BMI) 20.0-20.9, adult: Secondary | ICD-10-CM | POA: Diagnosis not present

## 2023-11-14 DIAGNOSIS — F0390 Unspecified dementia without behavioral disturbance: Secondary | ICD-10-CM | POA: Diagnosis not present

## 2023-11-14 DIAGNOSIS — D649 Anemia, unspecified: Secondary | ICD-10-CM | POA: Diagnosis not present

## 2023-11-14 DIAGNOSIS — F331 Major depressive disorder, recurrent, moderate: Secondary | ICD-10-CM | POA: Diagnosis not present

## 2023-11-14 DIAGNOSIS — M810 Age-related osteoporosis without current pathological fracture: Secondary | ICD-10-CM | POA: Diagnosis not present

## 2023-11-14 DIAGNOSIS — E785 Hyperlipidemia, unspecified: Secondary | ICD-10-CM | POA: Diagnosis not present

## 2023-11-14 DIAGNOSIS — I4891 Unspecified atrial fibrillation: Secondary | ICD-10-CM | POA: Diagnosis not present

## 2023-11-14 DIAGNOSIS — R569 Unspecified convulsions: Secondary | ICD-10-CM | POA: Diagnosis not present

## 2023-11-14 DIAGNOSIS — R293 Abnormal posture: Secondary | ICD-10-CM | POA: Diagnosis not present

## 2023-11-14 DIAGNOSIS — I10 Essential (primary) hypertension: Secondary | ICD-10-CM | POA: Diagnosis not present

## 2023-11-14 DIAGNOSIS — H409 Unspecified glaucoma: Secondary | ICD-10-CM | POA: Diagnosis not present

## 2023-11-16 DIAGNOSIS — R296 Repeated falls: Secondary | ICD-10-CM | POA: Diagnosis not present

## 2023-11-16 DIAGNOSIS — Z6820 Body mass index (BMI) 20.0-20.9, adult: Secondary | ICD-10-CM | POA: Diagnosis not present

## 2023-11-16 DIAGNOSIS — K219 Gastro-esophageal reflux disease without esophagitis: Secondary | ICD-10-CM | POA: Diagnosis not present

## 2023-11-16 DIAGNOSIS — E43 Unspecified severe protein-calorie malnutrition: Secondary | ICD-10-CM | POA: Diagnosis not present

## 2023-11-16 DIAGNOSIS — G311 Senile degeneration of brain, not elsewhere classified: Secondary | ICD-10-CM | POA: Diagnosis not present

## 2023-11-16 DIAGNOSIS — Z8719 Personal history of other diseases of the digestive system: Secondary | ICD-10-CM | POA: Diagnosis not present

## 2023-11-17 DIAGNOSIS — Z8719 Personal history of other diseases of the digestive system: Secondary | ICD-10-CM | POA: Diagnosis not present

## 2023-11-17 DIAGNOSIS — E43 Unspecified severe protein-calorie malnutrition: Secondary | ICD-10-CM | POA: Diagnosis not present

## 2023-11-17 DIAGNOSIS — K219 Gastro-esophageal reflux disease without esophagitis: Secondary | ICD-10-CM | POA: Diagnosis not present

## 2023-11-17 DIAGNOSIS — R296 Repeated falls: Secondary | ICD-10-CM | POA: Diagnosis not present

## 2023-11-17 DIAGNOSIS — Z6820 Body mass index (BMI) 20.0-20.9, adult: Secondary | ICD-10-CM | POA: Diagnosis not present

## 2023-11-17 DIAGNOSIS — G311 Senile degeneration of brain, not elsewhere classified: Secondary | ICD-10-CM | POA: Diagnosis not present

## 2023-11-18 DIAGNOSIS — E43 Unspecified severe protein-calorie malnutrition: Secondary | ICD-10-CM | POA: Diagnosis not present

## 2023-11-18 DIAGNOSIS — R569 Unspecified convulsions: Secondary | ICD-10-CM | POA: Diagnosis not present

## 2023-11-18 DIAGNOSIS — E785 Hyperlipidemia, unspecified: Secondary | ICD-10-CM | POA: Diagnosis not present

## 2023-11-18 DIAGNOSIS — M810 Age-related osteoporosis without current pathological fracture: Secondary | ICD-10-CM | POA: Diagnosis not present

## 2023-11-18 DIAGNOSIS — I4891 Unspecified atrial fibrillation: Secondary | ICD-10-CM | POA: Diagnosis not present

## 2023-11-18 DIAGNOSIS — F0390 Unspecified dementia without behavioral disturbance: Secondary | ICD-10-CM | POA: Diagnosis not present

## 2023-11-18 DIAGNOSIS — I10 Essential (primary) hypertension: Secondary | ICD-10-CM | POA: Diagnosis not present

## 2023-11-18 DIAGNOSIS — G311 Senile degeneration of brain, not elsewhere classified: Secondary | ICD-10-CM | POA: Diagnosis not present

## 2023-11-18 DIAGNOSIS — R296 Repeated falls: Secondary | ICD-10-CM | POA: Diagnosis not present

## 2023-11-18 DIAGNOSIS — Z8719 Personal history of other diseases of the digestive system: Secondary | ICD-10-CM | POA: Diagnosis not present

## 2023-11-18 DIAGNOSIS — F331 Major depressive disorder, recurrent, moderate: Secondary | ICD-10-CM | POA: Diagnosis not present

## 2023-11-18 DIAGNOSIS — Z6820 Body mass index (BMI) 20.0-20.9, adult: Secondary | ICD-10-CM | POA: Diagnosis not present

## 2023-11-18 DIAGNOSIS — D649 Anemia, unspecified: Secondary | ICD-10-CM | POA: Diagnosis not present

## 2023-11-18 DIAGNOSIS — H409 Unspecified glaucoma: Secondary | ICD-10-CM | POA: Diagnosis not present

## 2023-11-18 DIAGNOSIS — R293 Abnormal posture: Secondary | ICD-10-CM | POA: Diagnosis not present

## 2023-11-18 DIAGNOSIS — K219 Gastro-esophageal reflux disease without esophagitis: Secondary | ICD-10-CM | POA: Diagnosis not present

## 2023-11-20 DIAGNOSIS — Z6820 Body mass index (BMI) 20.0-20.9, adult: Secondary | ICD-10-CM | POA: Diagnosis not present

## 2023-11-20 DIAGNOSIS — Z8719 Personal history of other diseases of the digestive system: Secondary | ICD-10-CM | POA: Diagnosis not present

## 2023-11-20 DIAGNOSIS — R296 Repeated falls: Secondary | ICD-10-CM | POA: Diagnosis not present

## 2023-11-20 DIAGNOSIS — G311 Senile degeneration of brain, not elsewhere classified: Secondary | ICD-10-CM | POA: Diagnosis not present

## 2023-11-20 DIAGNOSIS — E43 Unspecified severe protein-calorie malnutrition: Secondary | ICD-10-CM | POA: Diagnosis not present

## 2023-11-20 DIAGNOSIS — K219 Gastro-esophageal reflux disease without esophagitis: Secondary | ICD-10-CM | POA: Diagnosis not present

## 2023-11-23 DIAGNOSIS — E43 Unspecified severe protein-calorie malnutrition: Secondary | ICD-10-CM | POA: Diagnosis not present

## 2023-11-23 DIAGNOSIS — Z8719 Personal history of other diseases of the digestive system: Secondary | ICD-10-CM | POA: Diagnosis not present

## 2023-11-23 DIAGNOSIS — R296 Repeated falls: Secondary | ICD-10-CM | POA: Diagnosis not present

## 2023-11-23 DIAGNOSIS — G311 Senile degeneration of brain, not elsewhere classified: Secondary | ICD-10-CM | POA: Diagnosis not present

## 2023-11-23 DIAGNOSIS — K219 Gastro-esophageal reflux disease without esophagitis: Secondary | ICD-10-CM | POA: Diagnosis not present

## 2023-11-23 DIAGNOSIS — Z6820 Body mass index (BMI) 20.0-20.9, adult: Secondary | ICD-10-CM | POA: Diagnosis not present

## 2023-11-24 DIAGNOSIS — G311 Senile degeneration of brain, not elsewhere classified: Secondary | ICD-10-CM | POA: Diagnosis not present

## 2023-11-24 DIAGNOSIS — Z8719 Personal history of other diseases of the digestive system: Secondary | ICD-10-CM | POA: Diagnosis not present

## 2023-11-24 DIAGNOSIS — R296 Repeated falls: Secondary | ICD-10-CM | POA: Diagnosis not present

## 2023-11-24 DIAGNOSIS — K219 Gastro-esophageal reflux disease without esophagitis: Secondary | ICD-10-CM | POA: Diagnosis not present

## 2023-11-24 DIAGNOSIS — Z6820 Body mass index (BMI) 20.0-20.9, adult: Secondary | ICD-10-CM | POA: Diagnosis not present

## 2023-11-24 DIAGNOSIS — E43 Unspecified severe protein-calorie malnutrition: Secondary | ICD-10-CM | POA: Diagnosis not present

## 2023-11-25 DIAGNOSIS — E43 Unspecified severe protein-calorie malnutrition: Secondary | ICD-10-CM | POA: Diagnosis not present

## 2023-11-25 DIAGNOSIS — G311 Senile degeneration of brain, not elsewhere classified: Secondary | ICD-10-CM | POA: Diagnosis not present

## 2023-11-25 DIAGNOSIS — R296 Repeated falls: Secondary | ICD-10-CM | POA: Diagnosis not present

## 2023-11-25 DIAGNOSIS — Z8719 Personal history of other diseases of the digestive system: Secondary | ICD-10-CM | POA: Diagnosis not present

## 2023-11-25 DIAGNOSIS — K219 Gastro-esophageal reflux disease without esophagitis: Secondary | ICD-10-CM | POA: Diagnosis not present

## 2023-11-25 DIAGNOSIS — Z6820 Body mass index (BMI) 20.0-20.9, adult: Secondary | ICD-10-CM | POA: Diagnosis not present

## 2023-11-27 DIAGNOSIS — Z6820 Body mass index (BMI) 20.0-20.9, adult: Secondary | ICD-10-CM | POA: Diagnosis not present

## 2023-11-27 DIAGNOSIS — G311 Senile degeneration of brain, not elsewhere classified: Secondary | ICD-10-CM | POA: Diagnosis not present

## 2023-11-27 DIAGNOSIS — K219 Gastro-esophageal reflux disease without esophagitis: Secondary | ICD-10-CM | POA: Diagnosis not present

## 2023-11-27 DIAGNOSIS — R296 Repeated falls: Secondary | ICD-10-CM | POA: Diagnosis not present

## 2023-11-27 DIAGNOSIS — E43 Unspecified severe protein-calorie malnutrition: Secondary | ICD-10-CM | POA: Diagnosis not present

## 2023-11-27 DIAGNOSIS — Z8719 Personal history of other diseases of the digestive system: Secondary | ICD-10-CM | POA: Diagnosis not present

## 2023-11-30 DIAGNOSIS — E43 Unspecified severe protein-calorie malnutrition: Secondary | ICD-10-CM | POA: Diagnosis not present

## 2023-11-30 DIAGNOSIS — K219 Gastro-esophageal reflux disease without esophagitis: Secondary | ICD-10-CM | POA: Diagnosis not present

## 2023-11-30 DIAGNOSIS — Z6820 Body mass index (BMI) 20.0-20.9, adult: Secondary | ICD-10-CM | POA: Diagnosis not present

## 2023-11-30 DIAGNOSIS — G311 Senile degeneration of brain, not elsewhere classified: Secondary | ICD-10-CM | POA: Diagnosis not present

## 2023-11-30 DIAGNOSIS — Z8719 Personal history of other diseases of the digestive system: Secondary | ICD-10-CM | POA: Diagnosis not present

## 2023-11-30 DIAGNOSIS — R296 Repeated falls: Secondary | ICD-10-CM | POA: Diagnosis not present

## 2023-12-01 DIAGNOSIS — D649 Anemia, unspecified: Secondary | ICD-10-CM | POA: Diagnosis not present

## 2023-12-01 DIAGNOSIS — H409 Unspecified glaucoma: Secondary | ICD-10-CM | POA: Diagnosis not present

## 2023-12-01 DIAGNOSIS — R569 Unspecified convulsions: Secondary | ICD-10-CM | POA: Diagnosis not present

## 2023-12-01 DIAGNOSIS — F331 Major depressive disorder, recurrent, moderate: Secondary | ICD-10-CM | POA: Diagnosis not present

## 2023-12-01 DIAGNOSIS — M810 Age-related osteoporosis without current pathological fracture: Secondary | ICD-10-CM | POA: Diagnosis not present

## 2023-12-01 DIAGNOSIS — F0390 Unspecified dementia without behavioral disturbance: Secondary | ICD-10-CM | POA: Diagnosis not present

## 2023-12-01 DIAGNOSIS — R293 Abnormal posture: Secondary | ICD-10-CM | POA: Diagnosis not present

## 2023-12-01 DIAGNOSIS — I10 Essential (primary) hypertension: Secondary | ICD-10-CM | POA: Diagnosis not present

## 2023-12-01 DIAGNOSIS — I4891 Unspecified atrial fibrillation: Secondary | ICD-10-CM | POA: Diagnosis not present

## 2023-12-01 DIAGNOSIS — E785 Hyperlipidemia, unspecified: Secondary | ICD-10-CM | POA: Diagnosis not present

## 2023-12-02 DIAGNOSIS — Z8719 Personal history of other diseases of the digestive system: Secondary | ICD-10-CM | POA: Diagnosis not present

## 2023-12-02 DIAGNOSIS — R296 Repeated falls: Secondary | ICD-10-CM | POA: Diagnosis not present

## 2023-12-02 DIAGNOSIS — K219 Gastro-esophageal reflux disease without esophagitis: Secondary | ICD-10-CM | POA: Diagnosis not present

## 2023-12-02 DIAGNOSIS — G311 Senile degeneration of brain, not elsewhere classified: Secondary | ICD-10-CM | POA: Diagnosis not present

## 2023-12-02 DIAGNOSIS — E43 Unspecified severe protein-calorie malnutrition: Secondary | ICD-10-CM | POA: Diagnosis not present

## 2023-12-02 DIAGNOSIS — Z6820 Body mass index (BMI) 20.0-20.9, adult: Secondary | ICD-10-CM | POA: Diagnosis not present

## 2023-12-06 DIAGNOSIS — E785 Hyperlipidemia, unspecified: Secondary | ICD-10-CM | POA: Diagnosis not present

## 2023-12-06 DIAGNOSIS — F331 Major depressive disorder, recurrent, moderate: Secondary | ICD-10-CM | POA: Diagnosis not present

## 2023-12-06 DIAGNOSIS — M810 Age-related osteoporosis without current pathological fracture: Secondary | ICD-10-CM | POA: Diagnosis not present

## 2023-12-06 DIAGNOSIS — R293 Abnormal posture: Secondary | ICD-10-CM | POA: Diagnosis not present

## 2023-12-06 DIAGNOSIS — F0390 Unspecified dementia without behavioral disturbance: Secondary | ICD-10-CM | POA: Diagnosis not present

## 2023-12-06 DIAGNOSIS — I4891 Unspecified atrial fibrillation: Secondary | ICD-10-CM | POA: Diagnosis not present

## 2023-12-06 DIAGNOSIS — R569 Unspecified convulsions: Secondary | ICD-10-CM | POA: Diagnosis not present

## 2023-12-06 DIAGNOSIS — D649 Anemia, unspecified: Secondary | ICD-10-CM | POA: Diagnosis not present

## 2023-12-06 DIAGNOSIS — H409 Unspecified glaucoma: Secondary | ICD-10-CM | POA: Diagnosis not present

## 2023-12-06 DIAGNOSIS — I10 Essential (primary) hypertension: Secondary | ICD-10-CM | POA: Diagnosis not present

## 2023-12-07 DIAGNOSIS — Z6820 Body mass index (BMI) 20.0-20.9, adult: Secondary | ICD-10-CM | POA: Diagnosis not present

## 2023-12-07 DIAGNOSIS — R296 Repeated falls: Secondary | ICD-10-CM | POA: Diagnosis not present

## 2023-12-07 DIAGNOSIS — K219 Gastro-esophageal reflux disease without esophagitis: Secondary | ICD-10-CM | POA: Diagnosis not present

## 2023-12-07 DIAGNOSIS — Z8719 Personal history of other diseases of the digestive system: Secondary | ICD-10-CM | POA: Diagnosis not present

## 2023-12-07 DIAGNOSIS — E43 Unspecified severe protein-calorie malnutrition: Secondary | ICD-10-CM | POA: Diagnosis not present

## 2023-12-07 DIAGNOSIS — G311 Senile degeneration of brain, not elsewhere classified: Secondary | ICD-10-CM | POA: Diagnosis not present

## 2023-12-08 DIAGNOSIS — G311 Senile degeneration of brain, not elsewhere classified: Secondary | ICD-10-CM | POA: Diagnosis not present

## 2023-12-08 DIAGNOSIS — Z6820 Body mass index (BMI) 20.0-20.9, adult: Secondary | ICD-10-CM | POA: Diagnosis not present

## 2023-12-08 DIAGNOSIS — R296 Repeated falls: Secondary | ICD-10-CM | POA: Diagnosis not present

## 2023-12-08 DIAGNOSIS — Z8719 Personal history of other diseases of the digestive system: Secondary | ICD-10-CM | POA: Diagnosis not present

## 2023-12-08 DIAGNOSIS — E43 Unspecified severe protein-calorie malnutrition: Secondary | ICD-10-CM | POA: Diagnosis not present

## 2023-12-08 DIAGNOSIS — K219 Gastro-esophageal reflux disease without esophagitis: Secondary | ICD-10-CM | POA: Diagnosis not present

## 2023-12-09 DIAGNOSIS — R296 Repeated falls: Secondary | ICD-10-CM | POA: Diagnosis not present

## 2023-12-09 DIAGNOSIS — G311 Senile degeneration of brain, not elsewhere classified: Secondary | ICD-10-CM | POA: Diagnosis not present

## 2023-12-09 DIAGNOSIS — E43 Unspecified severe protein-calorie malnutrition: Secondary | ICD-10-CM | POA: Diagnosis not present

## 2023-12-09 DIAGNOSIS — Z6820 Body mass index (BMI) 20.0-20.9, adult: Secondary | ICD-10-CM | POA: Diagnosis not present

## 2023-12-09 DIAGNOSIS — Z8719 Personal history of other diseases of the digestive system: Secondary | ICD-10-CM | POA: Diagnosis not present

## 2023-12-09 DIAGNOSIS — K219 Gastro-esophageal reflux disease without esophagitis: Secondary | ICD-10-CM | POA: Diagnosis not present

## 2023-12-11 DIAGNOSIS — Z8719 Personal history of other diseases of the digestive system: Secondary | ICD-10-CM | POA: Diagnosis not present

## 2023-12-11 DIAGNOSIS — K219 Gastro-esophageal reflux disease without esophagitis: Secondary | ICD-10-CM | POA: Diagnosis not present

## 2023-12-11 DIAGNOSIS — Z6820 Body mass index (BMI) 20.0-20.9, adult: Secondary | ICD-10-CM | POA: Diagnosis not present

## 2023-12-11 DIAGNOSIS — G311 Senile degeneration of brain, not elsewhere classified: Secondary | ICD-10-CM | POA: Diagnosis not present

## 2023-12-11 DIAGNOSIS — E43 Unspecified severe protein-calorie malnutrition: Secondary | ICD-10-CM | POA: Diagnosis not present

## 2023-12-11 DIAGNOSIS — R296 Repeated falls: Secondary | ICD-10-CM | POA: Diagnosis not present

## 2023-12-12 DIAGNOSIS — Z87440 Personal history of urinary (tract) infections: Secondary | ICD-10-CM | POA: Diagnosis not present

## 2023-12-12 DIAGNOSIS — D631 Anemia in chronic kidney disease: Secondary | ICD-10-CM | POA: Diagnosis not present

## 2023-12-12 DIAGNOSIS — E43 Unspecified severe protein-calorie malnutrition: Secondary | ICD-10-CM | POA: Diagnosis not present

## 2023-12-12 DIAGNOSIS — Z8719 Personal history of other diseases of the digestive system: Secondary | ICD-10-CM | POA: Diagnosis not present

## 2023-12-12 DIAGNOSIS — G311 Senile degeneration of brain, not elsewhere classified: Secondary | ICD-10-CM | POA: Diagnosis not present

## 2023-12-12 DIAGNOSIS — R296 Repeated falls: Secondary | ICD-10-CM | POA: Diagnosis not present

## 2023-12-12 DIAGNOSIS — N1831 Chronic kidney disease, stage 3a: Secondary | ICD-10-CM | POA: Diagnosis not present

## 2023-12-12 DIAGNOSIS — E785 Hyperlipidemia, unspecified: Secondary | ICD-10-CM | POA: Diagnosis not present

## 2023-12-12 DIAGNOSIS — I129 Hypertensive chronic kidney disease with stage 1 through stage 4 chronic kidney disease, or unspecified chronic kidney disease: Secondary | ICD-10-CM | POA: Diagnosis not present

## 2023-12-12 DIAGNOSIS — I4891 Unspecified atrial fibrillation: Secondary | ICD-10-CM | POA: Diagnosis not present

## 2023-12-12 DIAGNOSIS — Z6820 Body mass index (BMI) 20.0-20.9, adult: Secondary | ICD-10-CM | POA: Diagnosis not present

## 2023-12-12 DIAGNOSIS — K219 Gastro-esophageal reflux disease without esophagitis: Secondary | ICD-10-CM | POA: Diagnosis not present

## 2023-12-14 DIAGNOSIS — Z8719 Personal history of other diseases of the digestive system: Secondary | ICD-10-CM | POA: Diagnosis not present

## 2023-12-14 DIAGNOSIS — E43 Unspecified severe protein-calorie malnutrition: Secondary | ICD-10-CM | POA: Diagnosis not present

## 2023-12-14 DIAGNOSIS — G311 Senile degeneration of brain, not elsewhere classified: Secondary | ICD-10-CM | POA: Diagnosis not present

## 2023-12-14 DIAGNOSIS — R296 Repeated falls: Secondary | ICD-10-CM | POA: Diagnosis not present

## 2023-12-14 DIAGNOSIS — K219 Gastro-esophageal reflux disease without esophagitis: Secondary | ICD-10-CM | POA: Diagnosis not present

## 2023-12-14 DIAGNOSIS — Z6820 Body mass index (BMI) 20.0-20.9, adult: Secondary | ICD-10-CM | POA: Diagnosis not present

## 2023-12-15 DIAGNOSIS — G311 Senile degeneration of brain, not elsewhere classified: Secondary | ICD-10-CM | POA: Diagnosis not present

## 2023-12-15 DIAGNOSIS — E43 Unspecified severe protein-calorie malnutrition: Secondary | ICD-10-CM | POA: Diagnosis not present

## 2023-12-15 DIAGNOSIS — Z6820 Body mass index (BMI) 20.0-20.9, adult: Secondary | ICD-10-CM | POA: Diagnosis not present

## 2023-12-15 DIAGNOSIS — K219 Gastro-esophageal reflux disease without esophagitis: Secondary | ICD-10-CM | POA: Diagnosis not present

## 2023-12-15 DIAGNOSIS — R296 Repeated falls: Secondary | ICD-10-CM | POA: Diagnosis not present

## 2023-12-15 DIAGNOSIS — Z8719 Personal history of other diseases of the digestive system: Secondary | ICD-10-CM | POA: Diagnosis not present

## 2023-12-16 DIAGNOSIS — K219 Gastro-esophageal reflux disease without esophagitis: Secondary | ICD-10-CM | POA: Diagnosis not present

## 2023-12-16 DIAGNOSIS — E43 Unspecified severe protein-calorie malnutrition: Secondary | ICD-10-CM | POA: Diagnosis not present

## 2023-12-16 DIAGNOSIS — R296 Repeated falls: Secondary | ICD-10-CM | POA: Diagnosis not present

## 2023-12-16 DIAGNOSIS — Z6820 Body mass index (BMI) 20.0-20.9, adult: Secondary | ICD-10-CM | POA: Diagnosis not present

## 2023-12-16 DIAGNOSIS — G311 Senile degeneration of brain, not elsewhere classified: Secondary | ICD-10-CM | POA: Diagnosis not present

## 2023-12-16 DIAGNOSIS — Z8719 Personal history of other diseases of the digestive system: Secondary | ICD-10-CM | POA: Diagnosis not present

## 2023-12-17 DIAGNOSIS — D649 Anemia, unspecified: Secondary | ICD-10-CM | POA: Diagnosis not present

## 2023-12-17 DIAGNOSIS — H409 Unspecified glaucoma: Secondary | ICD-10-CM | POA: Diagnosis not present

## 2023-12-17 DIAGNOSIS — R569 Unspecified convulsions: Secondary | ICD-10-CM | POA: Diagnosis not present

## 2023-12-17 DIAGNOSIS — I4891 Unspecified atrial fibrillation: Secondary | ICD-10-CM | POA: Diagnosis not present

## 2023-12-17 DIAGNOSIS — R293 Abnormal posture: Secondary | ICD-10-CM | POA: Diagnosis not present

## 2023-12-17 DIAGNOSIS — F331 Major depressive disorder, recurrent, moderate: Secondary | ICD-10-CM | POA: Diagnosis not present

## 2023-12-17 DIAGNOSIS — E785 Hyperlipidemia, unspecified: Secondary | ICD-10-CM | POA: Diagnosis not present

## 2023-12-17 DIAGNOSIS — I10 Essential (primary) hypertension: Secondary | ICD-10-CM | POA: Diagnosis not present

## 2023-12-17 DIAGNOSIS — F0390 Unspecified dementia without behavioral disturbance: Secondary | ICD-10-CM | POA: Diagnosis not present

## 2023-12-17 DIAGNOSIS — M810 Age-related osteoporosis without current pathological fracture: Secondary | ICD-10-CM | POA: Diagnosis not present

## 2023-12-18 DIAGNOSIS — G311 Senile degeneration of brain, not elsewhere classified: Secondary | ICD-10-CM | POA: Diagnosis not present

## 2023-12-18 DIAGNOSIS — Z8719 Personal history of other diseases of the digestive system: Secondary | ICD-10-CM | POA: Diagnosis not present

## 2023-12-18 DIAGNOSIS — R296 Repeated falls: Secondary | ICD-10-CM | POA: Diagnosis not present

## 2023-12-18 DIAGNOSIS — K219 Gastro-esophageal reflux disease without esophagitis: Secondary | ICD-10-CM | POA: Diagnosis not present

## 2023-12-18 DIAGNOSIS — E43 Unspecified severe protein-calorie malnutrition: Secondary | ICD-10-CM | POA: Diagnosis not present

## 2023-12-18 DIAGNOSIS — Z6820 Body mass index (BMI) 20.0-20.9, adult: Secondary | ICD-10-CM | POA: Diagnosis not present

## 2023-12-21 DIAGNOSIS — Z8719 Personal history of other diseases of the digestive system: Secondary | ICD-10-CM | POA: Diagnosis not present

## 2023-12-21 DIAGNOSIS — G311 Senile degeneration of brain, not elsewhere classified: Secondary | ICD-10-CM | POA: Diagnosis not present

## 2023-12-21 DIAGNOSIS — E43 Unspecified severe protein-calorie malnutrition: Secondary | ICD-10-CM | POA: Diagnosis not present

## 2023-12-21 DIAGNOSIS — K219 Gastro-esophageal reflux disease without esophagitis: Secondary | ICD-10-CM | POA: Diagnosis not present

## 2023-12-21 DIAGNOSIS — R296 Repeated falls: Secondary | ICD-10-CM | POA: Diagnosis not present

## 2023-12-21 DIAGNOSIS — Z6820 Body mass index (BMI) 20.0-20.9, adult: Secondary | ICD-10-CM | POA: Diagnosis not present

## 2023-12-22 DIAGNOSIS — K219 Gastro-esophageal reflux disease without esophagitis: Secondary | ICD-10-CM | POA: Diagnosis not present

## 2023-12-22 DIAGNOSIS — G311 Senile degeneration of brain, not elsewhere classified: Secondary | ICD-10-CM | POA: Diagnosis not present

## 2023-12-22 DIAGNOSIS — R296 Repeated falls: Secondary | ICD-10-CM | POA: Diagnosis not present

## 2023-12-22 DIAGNOSIS — Z6820 Body mass index (BMI) 20.0-20.9, adult: Secondary | ICD-10-CM | POA: Diagnosis not present

## 2023-12-22 DIAGNOSIS — Z8719 Personal history of other diseases of the digestive system: Secondary | ICD-10-CM | POA: Diagnosis not present

## 2023-12-22 DIAGNOSIS — E43 Unspecified severe protein-calorie malnutrition: Secondary | ICD-10-CM | POA: Diagnosis not present

## 2023-12-23 DIAGNOSIS — G311 Senile degeneration of brain, not elsewhere classified: Secondary | ICD-10-CM | POA: Diagnosis not present

## 2023-12-23 DIAGNOSIS — R296 Repeated falls: Secondary | ICD-10-CM | POA: Diagnosis not present

## 2023-12-23 DIAGNOSIS — K219 Gastro-esophageal reflux disease without esophagitis: Secondary | ICD-10-CM | POA: Diagnosis not present

## 2023-12-23 DIAGNOSIS — E43 Unspecified severe protein-calorie malnutrition: Secondary | ICD-10-CM | POA: Diagnosis not present

## 2023-12-23 DIAGNOSIS — Z8719 Personal history of other diseases of the digestive system: Secondary | ICD-10-CM | POA: Diagnosis not present

## 2023-12-23 DIAGNOSIS — Z6820 Body mass index (BMI) 20.0-20.9, adult: Secondary | ICD-10-CM | POA: Diagnosis not present

## 2023-12-25 DIAGNOSIS — G311 Senile degeneration of brain, not elsewhere classified: Secondary | ICD-10-CM | POA: Diagnosis not present

## 2023-12-25 DIAGNOSIS — Z8719 Personal history of other diseases of the digestive system: Secondary | ICD-10-CM | POA: Diagnosis not present

## 2023-12-25 DIAGNOSIS — R296 Repeated falls: Secondary | ICD-10-CM | POA: Diagnosis not present

## 2023-12-25 DIAGNOSIS — Z6820 Body mass index (BMI) 20.0-20.9, adult: Secondary | ICD-10-CM | POA: Diagnosis not present

## 2023-12-25 DIAGNOSIS — K219 Gastro-esophageal reflux disease without esophagitis: Secondary | ICD-10-CM | POA: Diagnosis not present

## 2023-12-25 DIAGNOSIS — E43 Unspecified severe protein-calorie malnutrition: Secondary | ICD-10-CM | POA: Diagnosis not present

## 2023-12-28 DIAGNOSIS — E43 Unspecified severe protein-calorie malnutrition: Secondary | ICD-10-CM | POA: Diagnosis not present

## 2023-12-28 DIAGNOSIS — R569 Unspecified convulsions: Secondary | ICD-10-CM | POA: Diagnosis not present

## 2023-12-28 DIAGNOSIS — E785 Hyperlipidemia, unspecified: Secondary | ICD-10-CM | POA: Diagnosis not present

## 2023-12-28 DIAGNOSIS — I4891 Unspecified atrial fibrillation: Secondary | ICD-10-CM | POA: Diagnosis not present

## 2023-12-28 DIAGNOSIS — G311 Senile degeneration of brain, not elsewhere classified: Secondary | ICD-10-CM | POA: Diagnosis not present

## 2023-12-28 DIAGNOSIS — R293 Abnormal posture: Secondary | ICD-10-CM | POA: Diagnosis not present

## 2023-12-28 DIAGNOSIS — D649 Anemia, unspecified: Secondary | ICD-10-CM | POA: Diagnosis not present

## 2023-12-28 DIAGNOSIS — M810 Age-related osteoporosis without current pathological fracture: Secondary | ICD-10-CM | POA: Diagnosis not present

## 2023-12-28 DIAGNOSIS — K219 Gastro-esophageal reflux disease without esophagitis: Secondary | ICD-10-CM | POA: Diagnosis not present

## 2023-12-28 DIAGNOSIS — F331 Major depressive disorder, recurrent, moderate: Secondary | ICD-10-CM | POA: Diagnosis not present

## 2023-12-28 DIAGNOSIS — F0390 Unspecified dementia without behavioral disturbance: Secondary | ICD-10-CM | POA: Diagnosis not present

## 2023-12-28 DIAGNOSIS — I10 Essential (primary) hypertension: Secondary | ICD-10-CM | POA: Diagnosis not present

## 2023-12-28 DIAGNOSIS — R296 Repeated falls: Secondary | ICD-10-CM | POA: Diagnosis not present

## 2023-12-28 DIAGNOSIS — Z6820 Body mass index (BMI) 20.0-20.9, adult: Secondary | ICD-10-CM | POA: Diagnosis not present

## 2023-12-28 DIAGNOSIS — Z8719 Personal history of other diseases of the digestive system: Secondary | ICD-10-CM | POA: Diagnosis not present

## 2023-12-28 DIAGNOSIS — H409 Unspecified glaucoma: Secondary | ICD-10-CM | POA: Diagnosis not present

## 2023-12-29 DIAGNOSIS — M810 Age-related osteoporosis without current pathological fracture: Secondary | ICD-10-CM | POA: Diagnosis not present

## 2023-12-29 DIAGNOSIS — I4891 Unspecified atrial fibrillation: Secondary | ICD-10-CM | POA: Diagnosis not present

## 2023-12-29 DIAGNOSIS — H409 Unspecified glaucoma: Secondary | ICD-10-CM | POA: Diagnosis not present

## 2023-12-29 DIAGNOSIS — D649 Anemia, unspecified: Secondary | ICD-10-CM | POA: Diagnosis not present

## 2023-12-29 DIAGNOSIS — I10 Essential (primary) hypertension: Secondary | ICD-10-CM | POA: Diagnosis not present

## 2023-12-29 DIAGNOSIS — R569 Unspecified convulsions: Secondary | ICD-10-CM | POA: Diagnosis not present

## 2023-12-29 DIAGNOSIS — E785 Hyperlipidemia, unspecified: Secondary | ICD-10-CM | POA: Diagnosis not present

## 2023-12-29 DIAGNOSIS — F331 Major depressive disorder, recurrent, moderate: Secondary | ICD-10-CM | POA: Diagnosis not present

## 2023-12-29 DIAGNOSIS — R293 Abnormal posture: Secondary | ICD-10-CM | POA: Diagnosis not present

## 2023-12-29 DIAGNOSIS — F0390 Unspecified dementia without behavioral disturbance: Secondary | ICD-10-CM | POA: Diagnosis not present

## 2023-12-30 DIAGNOSIS — Z6820 Body mass index (BMI) 20.0-20.9, adult: Secondary | ICD-10-CM | POA: Diagnosis not present

## 2023-12-30 DIAGNOSIS — K219 Gastro-esophageal reflux disease without esophagitis: Secondary | ICD-10-CM | POA: Diagnosis not present

## 2023-12-30 DIAGNOSIS — Z8719 Personal history of other diseases of the digestive system: Secondary | ICD-10-CM | POA: Diagnosis not present

## 2023-12-30 DIAGNOSIS — G311 Senile degeneration of brain, not elsewhere classified: Secondary | ICD-10-CM | POA: Diagnosis not present

## 2023-12-30 DIAGNOSIS — R296 Repeated falls: Secondary | ICD-10-CM | POA: Diagnosis not present

## 2023-12-30 DIAGNOSIS — E43 Unspecified severe protein-calorie malnutrition: Secondary | ICD-10-CM | POA: Diagnosis not present

## 2024-01-01 DIAGNOSIS — G311 Senile degeneration of brain, not elsewhere classified: Secondary | ICD-10-CM | POA: Diagnosis not present

## 2024-01-01 DIAGNOSIS — K219 Gastro-esophageal reflux disease without esophagitis: Secondary | ICD-10-CM | POA: Diagnosis not present

## 2024-01-01 DIAGNOSIS — Z8719 Personal history of other diseases of the digestive system: Secondary | ICD-10-CM | POA: Diagnosis not present

## 2024-01-01 DIAGNOSIS — Z6820 Body mass index (BMI) 20.0-20.9, adult: Secondary | ICD-10-CM | POA: Diagnosis not present

## 2024-01-01 DIAGNOSIS — E43 Unspecified severe protein-calorie malnutrition: Secondary | ICD-10-CM | POA: Diagnosis not present

## 2024-01-01 DIAGNOSIS — R296 Repeated falls: Secondary | ICD-10-CM | POA: Diagnosis not present

## 2024-01-03 DIAGNOSIS — M810 Age-related osteoporosis without current pathological fracture: Secondary | ICD-10-CM | POA: Diagnosis not present

## 2024-01-03 DIAGNOSIS — F0390 Unspecified dementia without behavioral disturbance: Secondary | ICD-10-CM | POA: Diagnosis not present

## 2024-01-03 DIAGNOSIS — I10 Essential (primary) hypertension: Secondary | ICD-10-CM | POA: Diagnosis not present

## 2024-01-03 DIAGNOSIS — F331 Major depressive disorder, recurrent, moderate: Secondary | ICD-10-CM | POA: Diagnosis not present

## 2024-01-03 DIAGNOSIS — R293 Abnormal posture: Secondary | ICD-10-CM | POA: Diagnosis not present

## 2024-01-03 DIAGNOSIS — I4891 Unspecified atrial fibrillation: Secondary | ICD-10-CM | POA: Diagnosis not present

## 2024-01-03 DIAGNOSIS — E785 Hyperlipidemia, unspecified: Secondary | ICD-10-CM | POA: Diagnosis not present

## 2024-01-03 DIAGNOSIS — D649 Anemia, unspecified: Secondary | ICD-10-CM | POA: Diagnosis not present

## 2024-01-03 DIAGNOSIS — H409 Unspecified glaucoma: Secondary | ICD-10-CM | POA: Diagnosis not present

## 2024-01-03 DIAGNOSIS — R569 Unspecified convulsions: Secondary | ICD-10-CM | POA: Diagnosis not present

## 2024-01-04 DIAGNOSIS — Z6820 Body mass index (BMI) 20.0-20.9, adult: Secondary | ICD-10-CM | POA: Diagnosis not present

## 2024-01-04 DIAGNOSIS — G311 Senile degeneration of brain, not elsewhere classified: Secondary | ICD-10-CM | POA: Diagnosis not present

## 2024-01-04 DIAGNOSIS — Z8719 Personal history of other diseases of the digestive system: Secondary | ICD-10-CM | POA: Diagnosis not present

## 2024-01-04 DIAGNOSIS — E43 Unspecified severe protein-calorie malnutrition: Secondary | ICD-10-CM | POA: Diagnosis not present

## 2024-01-04 DIAGNOSIS — K219 Gastro-esophageal reflux disease without esophagitis: Secondary | ICD-10-CM | POA: Diagnosis not present

## 2024-01-04 DIAGNOSIS — R296 Repeated falls: Secondary | ICD-10-CM | POA: Diagnosis not present

## 2024-01-06 DIAGNOSIS — K219 Gastro-esophageal reflux disease without esophagitis: Secondary | ICD-10-CM | POA: Diagnosis not present

## 2024-01-06 DIAGNOSIS — Z6820 Body mass index (BMI) 20.0-20.9, adult: Secondary | ICD-10-CM | POA: Diagnosis not present

## 2024-01-06 DIAGNOSIS — E43 Unspecified severe protein-calorie malnutrition: Secondary | ICD-10-CM | POA: Diagnosis not present

## 2024-01-06 DIAGNOSIS — Z8719 Personal history of other diseases of the digestive system: Secondary | ICD-10-CM | POA: Diagnosis not present

## 2024-01-06 DIAGNOSIS — G311 Senile degeneration of brain, not elsewhere classified: Secondary | ICD-10-CM | POA: Diagnosis not present

## 2024-01-06 DIAGNOSIS — R296 Repeated falls: Secondary | ICD-10-CM | POA: Diagnosis not present

## 2024-01-08 DIAGNOSIS — Z8719 Personal history of other diseases of the digestive system: Secondary | ICD-10-CM | POA: Diagnosis not present

## 2024-01-08 DIAGNOSIS — E43 Unspecified severe protein-calorie malnutrition: Secondary | ICD-10-CM | POA: Diagnosis not present

## 2024-01-08 DIAGNOSIS — G311 Senile degeneration of brain, not elsewhere classified: Secondary | ICD-10-CM | POA: Diagnosis not present

## 2024-01-08 DIAGNOSIS — Z6820 Body mass index (BMI) 20.0-20.9, adult: Secondary | ICD-10-CM | POA: Diagnosis not present

## 2024-01-08 DIAGNOSIS — R296 Repeated falls: Secondary | ICD-10-CM | POA: Diagnosis not present

## 2024-01-08 DIAGNOSIS — K219 Gastro-esophageal reflux disease without esophagitis: Secondary | ICD-10-CM | POA: Diagnosis not present

## 2024-01-09 DIAGNOSIS — K219 Gastro-esophageal reflux disease without esophagitis: Secondary | ICD-10-CM | POA: Diagnosis not present

## 2024-01-09 DIAGNOSIS — Z8719 Personal history of other diseases of the digestive system: Secondary | ICD-10-CM | POA: Diagnosis not present

## 2024-01-09 DIAGNOSIS — E785 Hyperlipidemia, unspecified: Secondary | ICD-10-CM | POA: Diagnosis not present

## 2024-01-09 DIAGNOSIS — R296 Repeated falls: Secondary | ICD-10-CM | POA: Diagnosis not present

## 2024-01-09 DIAGNOSIS — Z87440 Personal history of urinary (tract) infections: Secondary | ICD-10-CM | POA: Diagnosis not present

## 2024-01-09 DIAGNOSIS — G311 Senile degeneration of brain, not elsewhere classified: Secondary | ICD-10-CM | POA: Diagnosis not present

## 2024-01-09 DIAGNOSIS — E43 Unspecified severe protein-calorie malnutrition: Secondary | ICD-10-CM | POA: Diagnosis not present

## 2024-01-09 DIAGNOSIS — D631 Anemia in chronic kidney disease: Secondary | ICD-10-CM | POA: Diagnosis not present

## 2024-01-09 DIAGNOSIS — N1831 Chronic kidney disease, stage 3a: Secondary | ICD-10-CM | POA: Diagnosis not present

## 2024-01-09 DIAGNOSIS — I4891 Unspecified atrial fibrillation: Secondary | ICD-10-CM | POA: Diagnosis not present

## 2024-01-09 DIAGNOSIS — Z6820 Body mass index (BMI) 20.0-20.9, adult: Secondary | ICD-10-CM | POA: Diagnosis not present

## 2024-01-09 DIAGNOSIS — I129 Hypertensive chronic kidney disease with stage 1 through stage 4 chronic kidney disease, or unspecified chronic kidney disease: Secondary | ICD-10-CM | POA: Diagnosis not present

## 2024-01-11 DIAGNOSIS — K219 Gastro-esophageal reflux disease without esophagitis: Secondary | ICD-10-CM | POA: Diagnosis not present

## 2024-01-11 DIAGNOSIS — R296 Repeated falls: Secondary | ICD-10-CM | POA: Diagnosis not present

## 2024-01-11 DIAGNOSIS — E43 Unspecified severe protein-calorie malnutrition: Secondary | ICD-10-CM | POA: Diagnosis not present

## 2024-01-11 DIAGNOSIS — Z6820 Body mass index (BMI) 20.0-20.9, adult: Secondary | ICD-10-CM | POA: Diagnosis not present

## 2024-01-11 DIAGNOSIS — Z8719 Personal history of other diseases of the digestive system: Secondary | ICD-10-CM | POA: Diagnosis not present

## 2024-01-11 DIAGNOSIS — G311 Senile degeneration of brain, not elsewhere classified: Secondary | ICD-10-CM | POA: Diagnosis not present

## 2024-01-12 DIAGNOSIS — R569 Unspecified convulsions: Secondary | ICD-10-CM | POA: Diagnosis not present

## 2024-01-12 DIAGNOSIS — I4891 Unspecified atrial fibrillation: Secondary | ICD-10-CM | POA: Diagnosis not present

## 2024-01-12 DIAGNOSIS — F0390 Unspecified dementia without behavioral disturbance: Secondary | ICD-10-CM | POA: Diagnosis not present

## 2024-01-12 DIAGNOSIS — I10 Essential (primary) hypertension: Secondary | ICD-10-CM | POA: Diagnosis not present

## 2024-01-12 DIAGNOSIS — E785 Hyperlipidemia, unspecified: Secondary | ICD-10-CM | POA: Diagnosis not present

## 2024-01-12 DIAGNOSIS — F331 Major depressive disorder, recurrent, moderate: Secondary | ICD-10-CM | POA: Diagnosis not present

## 2024-01-12 DIAGNOSIS — D649 Anemia, unspecified: Secondary | ICD-10-CM | POA: Diagnosis not present

## 2024-01-12 DIAGNOSIS — M810 Age-related osteoporosis without current pathological fracture: Secondary | ICD-10-CM | POA: Diagnosis not present

## 2024-01-12 DIAGNOSIS — R293 Abnormal posture: Secondary | ICD-10-CM | POA: Diagnosis not present

## 2024-01-12 DIAGNOSIS — H409 Unspecified glaucoma: Secondary | ICD-10-CM | POA: Diagnosis not present

## 2024-01-13 DIAGNOSIS — G311 Senile degeneration of brain, not elsewhere classified: Secondary | ICD-10-CM | POA: Diagnosis not present

## 2024-01-13 DIAGNOSIS — K219 Gastro-esophageal reflux disease without esophagitis: Secondary | ICD-10-CM | POA: Diagnosis not present

## 2024-01-13 DIAGNOSIS — Z6820 Body mass index (BMI) 20.0-20.9, adult: Secondary | ICD-10-CM | POA: Diagnosis not present

## 2024-01-13 DIAGNOSIS — R296 Repeated falls: Secondary | ICD-10-CM | POA: Diagnosis not present

## 2024-01-13 DIAGNOSIS — E43 Unspecified severe protein-calorie malnutrition: Secondary | ICD-10-CM | POA: Diagnosis not present

## 2024-01-13 DIAGNOSIS — Z8719 Personal history of other diseases of the digestive system: Secondary | ICD-10-CM | POA: Diagnosis not present

## 2024-01-14 DIAGNOSIS — M810 Age-related osteoporosis without current pathological fracture: Secondary | ICD-10-CM | POA: Diagnosis not present

## 2024-01-14 DIAGNOSIS — I10 Essential (primary) hypertension: Secondary | ICD-10-CM | POA: Diagnosis not present

## 2024-01-14 DIAGNOSIS — F0390 Unspecified dementia without behavioral disturbance: Secondary | ICD-10-CM | POA: Diagnosis not present

## 2024-01-14 DIAGNOSIS — I4891 Unspecified atrial fibrillation: Secondary | ICD-10-CM | POA: Diagnosis not present

## 2024-01-14 DIAGNOSIS — F331 Major depressive disorder, recurrent, moderate: Secondary | ICD-10-CM | POA: Diagnosis not present

## 2024-01-14 DIAGNOSIS — Z8719 Personal history of other diseases of the digestive system: Secondary | ICD-10-CM | POA: Diagnosis not present

## 2024-01-14 DIAGNOSIS — R569 Unspecified convulsions: Secondary | ICD-10-CM | POA: Diagnosis not present

## 2024-01-14 DIAGNOSIS — R296 Repeated falls: Secondary | ICD-10-CM | POA: Diagnosis not present

## 2024-01-14 DIAGNOSIS — R293 Abnormal posture: Secondary | ICD-10-CM | POA: Diagnosis not present

## 2024-01-14 DIAGNOSIS — E43 Unspecified severe protein-calorie malnutrition: Secondary | ICD-10-CM | POA: Diagnosis not present

## 2024-01-14 DIAGNOSIS — E785 Hyperlipidemia, unspecified: Secondary | ICD-10-CM | POA: Diagnosis not present

## 2024-01-14 DIAGNOSIS — G311 Senile degeneration of brain, not elsewhere classified: Secondary | ICD-10-CM | POA: Diagnosis not present

## 2024-01-14 DIAGNOSIS — D649 Anemia, unspecified: Secondary | ICD-10-CM | POA: Diagnosis not present

## 2024-01-14 DIAGNOSIS — Z6820 Body mass index (BMI) 20.0-20.9, adult: Secondary | ICD-10-CM | POA: Diagnosis not present

## 2024-01-14 DIAGNOSIS — H409 Unspecified glaucoma: Secondary | ICD-10-CM | POA: Diagnosis not present

## 2024-01-14 DIAGNOSIS — K219 Gastro-esophageal reflux disease without esophagitis: Secondary | ICD-10-CM | POA: Diagnosis not present

## 2024-01-15 DIAGNOSIS — N390 Urinary tract infection, site not specified: Secondary | ICD-10-CM | POA: Diagnosis not present

## 2024-01-16 DIAGNOSIS — Z6820 Body mass index (BMI) 20.0-20.9, adult: Secondary | ICD-10-CM | POA: Diagnosis not present

## 2024-01-16 DIAGNOSIS — R296 Repeated falls: Secondary | ICD-10-CM | POA: Diagnosis not present

## 2024-01-16 DIAGNOSIS — R112 Nausea with vomiting, unspecified: Secondary | ICD-10-CM | POA: Diagnosis not present

## 2024-01-16 DIAGNOSIS — Z8719 Personal history of other diseases of the digestive system: Secondary | ICD-10-CM | POA: Diagnosis not present

## 2024-01-16 DIAGNOSIS — K219 Gastro-esophageal reflux disease without esophagitis: Secondary | ICD-10-CM | POA: Diagnosis not present

## 2024-01-16 DIAGNOSIS — G311 Senile degeneration of brain, not elsewhere classified: Secondary | ICD-10-CM | POA: Diagnosis not present

## 2024-01-16 DIAGNOSIS — E43 Unspecified severe protein-calorie malnutrition: Secondary | ICD-10-CM | POA: Diagnosis not present

## 2024-01-17 DIAGNOSIS — E785 Hyperlipidemia, unspecified: Secondary | ICD-10-CM | POA: Diagnosis not present

## 2024-01-17 DIAGNOSIS — H409 Unspecified glaucoma: Secondary | ICD-10-CM | POA: Diagnosis not present

## 2024-01-17 DIAGNOSIS — D649 Anemia, unspecified: Secondary | ICD-10-CM | POA: Diagnosis not present

## 2024-01-17 DIAGNOSIS — F0390 Unspecified dementia without behavioral disturbance: Secondary | ICD-10-CM | POA: Diagnosis not present

## 2024-01-17 DIAGNOSIS — F331 Major depressive disorder, recurrent, moderate: Secondary | ICD-10-CM | POA: Diagnosis not present

## 2024-01-17 DIAGNOSIS — I4891 Unspecified atrial fibrillation: Secondary | ICD-10-CM | POA: Diagnosis not present

## 2024-01-17 DIAGNOSIS — R293 Abnormal posture: Secondary | ICD-10-CM | POA: Diagnosis not present

## 2024-01-17 DIAGNOSIS — I10 Essential (primary) hypertension: Secondary | ICD-10-CM | POA: Diagnosis not present

## 2024-01-17 DIAGNOSIS — M810 Age-related osteoporosis without current pathological fracture: Secondary | ICD-10-CM | POA: Diagnosis not present

## 2024-01-17 DIAGNOSIS — R569 Unspecified convulsions: Secondary | ICD-10-CM | POA: Diagnosis not present

## 2024-01-18 DIAGNOSIS — E43 Unspecified severe protein-calorie malnutrition: Secondary | ICD-10-CM | POA: Diagnosis not present

## 2024-01-18 DIAGNOSIS — Z6820 Body mass index (BMI) 20.0-20.9, adult: Secondary | ICD-10-CM | POA: Diagnosis not present

## 2024-01-18 DIAGNOSIS — L603 Nail dystrophy: Secondary | ICD-10-CM | POA: Diagnosis not present

## 2024-01-18 DIAGNOSIS — Z8719 Personal history of other diseases of the digestive system: Secondary | ICD-10-CM | POA: Diagnosis not present

## 2024-01-18 DIAGNOSIS — G311 Senile degeneration of brain, not elsewhere classified: Secondary | ICD-10-CM | POA: Diagnosis not present

## 2024-01-18 DIAGNOSIS — K219 Gastro-esophageal reflux disease without esophagitis: Secondary | ICD-10-CM | POA: Diagnosis not present

## 2024-01-18 DIAGNOSIS — I739 Peripheral vascular disease, unspecified: Secondary | ICD-10-CM | POA: Diagnosis not present

## 2024-01-18 DIAGNOSIS — L602 Onychogryphosis: Secondary | ICD-10-CM | POA: Diagnosis not present

## 2024-01-18 DIAGNOSIS — R296 Repeated falls: Secondary | ICD-10-CM | POA: Diagnosis not present

## 2024-01-19 DIAGNOSIS — N390 Urinary tract infection, site not specified: Secondary | ICD-10-CM | POA: Diagnosis not present

## 2024-01-20 DIAGNOSIS — Z6820 Body mass index (BMI) 20.0-20.9, adult: Secondary | ICD-10-CM | POA: Diagnosis not present

## 2024-01-20 DIAGNOSIS — H409 Unspecified glaucoma: Secondary | ICD-10-CM | POA: Diagnosis not present

## 2024-01-20 DIAGNOSIS — G311 Senile degeneration of brain, not elsewhere classified: Secondary | ICD-10-CM | POA: Diagnosis not present

## 2024-01-20 DIAGNOSIS — Z8719 Personal history of other diseases of the digestive system: Secondary | ICD-10-CM | POA: Diagnosis not present

## 2024-01-20 DIAGNOSIS — E785 Hyperlipidemia, unspecified: Secondary | ICD-10-CM | POA: Diagnosis not present

## 2024-01-20 DIAGNOSIS — D649 Anemia, unspecified: Secondary | ICD-10-CM | POA: Diagnosis not present

## 2024-01-20 DIAGNOSIS — F0390 Unspecified dementia without behavioral disturbance: Secondary | ICD-10-CM | POA: Diagnosis not present

## 2024-01-20 DIAGNOSIS — I10 Essential (primary) hypertension: Secondary | ICD-10-CM | POA: Diagnosis not present

## 2024-01-20 DIAGNOSIS — K219 Gastro-esophageal reflux disease without esophagitis: Secondary | ICD-10-CM | POA: Diagnosis not present

## 2024-01-20 DIAGNOSIS — I4891 Unspecified atrial fibrillation: Secondary | ICD-10-CM | POA: Diagnosis not present

## 2024-01-20 DIAGNOSIS — R296 Repeated falls: Secondary | ICD-10-CM | POA: Diagnosis not present

## 2024-01-20 DIAGNOSIS — M810 Age-related osteoporosis without current pathological fracture: Secondary | ICD-10-CM | POA: Diagnosis not present

## 2024-01-20 DIAGNOSIS — E43 Unspecified severe protein-calorie malnutrition: Secondary | ICD-10-CM | POA: Diagnosis not present

## 2024-01-20 DIAGNOSIS — F331 Major depressive disorder, recurrent, moderate: Secondary | ICD-10-CM | POA: Diagnosis not present

## 2024-01-20 DIAGNOSIS — R293 Abnormal posture: Secondary | ICD-10-CM | POA: Diagnosis not present

## 2024-01-20 DIAGNOSIS — R569 Unspecified convulsions: Secondary | ICD-10-CM | POA: Diagnosis not present

## 2024-01-21 DIAGNOSIS — E43 Unspecified severe protein-calorie malnutrition: Secondary | ICD-10-CM | POA: Diagnosis not present

## 2024-01-21 DIAGNOSIS — Z6820 Body mass index (BMI) 20.0-20.9, adult: Secondary | ICD-10-CM | POA: Diagnosis not present

## 2024-01-21 DIAGNOSIS — R296 Repeated falls: Secondary | ICD-10-CM | POA: Diagnosis not present

## 2024-01-21 DIAGNOSIS — G311 Senile degeneration of brain, not elsewhere classified: Secondary | ICD-10-CM | POA: Diagnosis not present

## 2024-01-21 DIAGNOSIS — Z8719 Personal history of other diseases of the digestive system: Secondary | ICD-10-CM | POA: Diagnosis not present

## 2024-01-21 DIAGNOSIS — K219 Gastro-esophageal reflux disease without esophagitis: Secondary | ICD-10-CM | POA: Diagnosis not present

## 2024-01-25 DIAGNOSIS — R569 Unspecified convulsions: Secondary | ICD-10-CM | POA: Diagnosis not present

## 2024-01-25 DIAGNOSIS — Z6820 Body mass index (BMI) 20.0-20.9, adult: Secondary | ICD-10-CM | POA: Diagnosis not present

## 2024-01-25 DIAGNOSIS — D649 Anemia, unspecified: Secondary | ICD-10-CM | POA: Diagnosis not present

## 2024-01-25 DIAGNOSIS — E785 Hyperlipidemia, unspecified: Secondary | ICD-10-CM | POA: Diagnosis not present

## 2024-01-25 DIAGNOSIS — K219 Gastro-esophageal reflux disease without esophagitis: Secondary | ICD-10-CM | POA: Diagnosis not present

## 2024-01-25 DIAGNOSIS — H409 Unspecified glaucoma: Secondary | ICD-10-CM | POA: Diagnosis not present

## 2024-01-25 DIAGNOSIS — M810 Age-related osteoporosis without current pathological fracture: Secondary | ICD-10-CM | POA: Diagnosis not present

## 2024-01-25 DIAGNOSIS — I10 Essential (primary) hypertension: Secondary | ICD-10-CM | POA: Diagnosis not present

## 2024-01-25 DIAGNOSIS — E43 Unspecified severe protein-calorie malnutrition: Secondary | ICD-10-CM | POA: Diagnosis not present

## 2024-01-25 DIAGNOSIS — R293 Abnormal posture: Secondary | ICD-10-CM | POA: Diagnosis not present

## 2024-01-25 DIAGNOSIS — G311 Senile degeneration of brain, not elsewhere classified: Secondary | ICD-10-CM | POA: Diagnosis not present

## 2024-01-25 DIAGNOSIS — I4891 Unspecified atrial fibrillation: Secondary | ICD-10-CM | POA: Diagnosis not present

## 2024-01-25 DIAGNOSIS — Z8719 Personal history of other diseases of the digestive system: Secondary | ICD-10-CM | POA: Diagnosis not present

## 2024-01-25 DIAGNOSIS — F0390 Unspecified dementia without behavioral disturbance: Secondary | ICD-10-CM | POA: Diagnosis not present

## 2024-01-25 DIAGNOSIS — R296 Repeated falls: Secondary | ICD-10-CM | POA: Diagnosis not present

## 2024-01-25 DIAGNOSIS — F331 Major depressive disorder, recurrent, moderate: Secondary | ICD-10-CM | POA: Diagnosis not present

## 2024-01-27 DIAGNOSIS — Z6820 Body mass index (BMI) 20.0-20.9, adult: Secondary | ICD-10-CM | POA: Diagnosis not present

## 2024-01-27 DIAGNOSIS — Z8719 Personal history of other diseases of the digestive system: Secondary | ICD-10-CM | POA: Diagnosis not present

## 2024-01-27 DIAGNOSIS — G311 Senile degeneration of brain, not elsewhere classified: Secondary | ICD-10-CM | POA: Diagnosis not present

## 2024-01-27 DIAGNOSIS — K219 Gastro-esophageal reflux disease without esophagitis: Secondary | ICD-10-CM | POA: Diagnosis not present

## 2024-01-27 DIAGNOSIS — R296 Repeated falls: Secondary | ICD-10-CM | POA: Diagnosis not present

## 2024-01-27 DIAGNOSIS — E43 Unspecified severe protein-calorie malnutrition: Secondary | ICD-10-CM | POA: Diagnosis not present

## 2024-01-28 DIAGNOSIS — K219 Gastro-esophageal reflux disease without esophagitis: Secondary | ICD-10-CM | POA: Diagnosis not present

## 2024-01-28 DIAGNOSIS — R296 Repeated falls: Secondary | ICD-10-CM | POA: Diagnosis not present

## 2024-01-28 DIAGNOSIS — E43 Unspecified severe protein-calorie malnutrition: Secondary | ICD-10-CM | POA: Diagnosis not present

## 2024-01-28 DIAGNOSIS — Z6820 Body mass index (BMI) 20.0-20.9, adult: Secondary | ICD-10-CM | POA: Diagnosis not present

## 2024-01-28 DIAGNOSIS — G311 Senile degeneration of brain, not elsewhere classified: Secondary | ICD-10-CM | POA: Diagnosis not present

## 2024-01-28 DIAGNOSIS — Z8719 Personal history of other diseases of the digestive system: Secondary | ICD-10-CM | POA: Diagnosis not present

## 2024-01-29 DIAGNOSIS — K219 Gastro-esophageal reflux disease without esophagitis: Secondary | ICD-10-CM | POA: Diagnosis not present

## 2024-01-29 DIAGNOSIS — Z6820 Body mass index (BMI) 20.0-20.9, adult: Secondary | ICD-10-CM | POA: Diagnosis not present

## 2024-01-29 DIAGNOSIS — E43 Unspecified severe protein-calorie malnutrition: Secondary | ICD-10-CM | POA: Diagnosis not present

## 2024-01-29 DIAGNOSIS — G311 Senile degeneration of brain, not elsewhere classified: Secondary | ICD-10-CM | POA: Diagnosis not present

## 2024-01-29 DIAGNOSIS — R296 Repeated falls: Secondary | ICD-10-CM | POA: Diagnosis not present

## 2024-01-29 DIAGNOSIS — Z8719 Personal history of other diseases of the digestive system: Secondary | ICD-10-CM | POA: Diagnosis not present

## 2024-01-31 DIAGNOSIS — D649 Anemia, unspecified: Secondary | ICD-10-CM | POA: Diagnosis not present

## 2024-01-31 DIAGNOSIS — I10 Essential (primary) hypertension: Secondary | ICD-10-CM | POA: Diagnosis not present

## 2024-01-31 DIAGNOSIS — R293 Abnormal posture: Secondary | ICD-10-CM | POA: Diagnosis not present

## 2024-01-31 DIAGNOSIS — F331 Major depressive disorder, recurrent, moderate: Secondary | ICD-10-CM | POA: Diagnosis not present

## 2024-01-31 DIAGNOSIS — H409 Unspecified glaucoma: Secondary | ICD-10-CM | POA: Diagnosis not present

## 2024-01-31 DIAGNOSIS — R569 Unspecified convulsions: Secondary | ICD-10-CM | POA: Diagnosis not present

## 2024-01-31 DIAGNOSIS — M810 Age-related osteoporosis without current pathological fracture: Secondary | ICD-10-CM | POA: Diagnosis not present

## 2024-01-31 DIAGNOSIS — I4891 Unspecified atrial fibrillation: Secondary | ICD-10-CM | POA: Diagnosis not present

## 2024-01-31 DIAGNOSIS — E785 Hyperlipidemia, unspecified: Secondary | ICD-10-CM | POA: Diagnosis not present

## 2024-01-31 DIAGNOSIS — F0390 Unspecified dementia without behavioral disturbance: Secondary | ICD-10-CM | POA: Diagnosis not present

## 2024-02-01 DIAGNOSIS — G311 Senile degeneration of brain, not elsewhere classified: Secondary | ICD-10-CM | POA: Diagnosis not present

## 2024-02-01 DIAGNOSIS — E43 Unspecified severe protein-calorie malnutrition: Secondary | ICD-10-CM | POA: Diagnosis not present

## 2024-02-01 DIAGNOSIS — Z6820 Body mass index (BMI) 20.0-20.9, adult: Secondary | ICD-10-CM | POA: Diagnosis not present

## 2024-02-01 DIAGNOSIS — R296 Repeated falls: Secondary | ICD-10-CM | POA: Diagnosis not present

## 2024-02-01 DIAGNOSIS — K219 Gastro-esophageal reflux disease without esophagitis: Secondary | ICD-10-CM | POA: Diagnosis not present

## 2024-02-01 DIAGNOSIS — Z8719 Personal history of other diseases of the digestive system: Secondary | ICD-10-CM | POA: Diagnosis not present

## 2024-02-03 DIAGNOSIS — G311 Senile degeneration of brain, not elsewhere classified: Secondary | ICD-10-CM | POA: Diagnosis not present

## 2024-02-03 DIAGNOSIS — E43 Unspecified severe protein-calorie malnutrition: Secondary | ICD-10-CM | POA: Diagnosis not present

## 2024-02-03 DIAGNOSIS — R296 Repeated falls: Secondary | ICD-10-CM | POA: Diagnosis not present

## 2024-02-03 DIAGNOSIS — Z8719 Personal history of other diseases of the digestive system: Secondary | ICD-10-CM | POA: Diagnosis not present

## 2024-02-03 DIAGNOSIS — Z6820 Body mass index (BMI) 20.0-20.9, adult: Secondary | ICD-10-CM | POA: Diagnosis not present

## 2024-02-03 DIAGNOSIS — K219 Gastro-esophageal reflux disease without esophagitis: Secondary | ICD-10-CM | POA: Diagnosis not present

## 2024-02-05 DIAGNOSIS — R296 Repeated falls: Secondary | ICD-10-CM | POA: Diagnosis not present

## 2024-02-05 DIAGNOSIS — Z8719 Personal history of other diseases of the digestive system: Secondary | ICD-10-CM | POA: Diagnosis not present

## 2024-02-05 DIAGNOSIS — K219 Gastro-esophageal reflux disease without esophagitis: Secondary | ICD-10-CM | POA: Diagnosis not present

## 2024-02-05 DIAGNOSIS — Z6820 Body mass index (BMI) 20.0-20.9, adult: Secondary | ICD-10-CM | POA: Diagnosis not present

## 2024-02-05 DIAGNOSIS — G311 Senile degeneration of brain, not elsewhere classified: Secondary | ICD-10-CM | POA: Diagnosis not present

## 2024-02-05 DIAGNOSIS — E43 Unspecified severe protein-calorie malnutrition: Secondary | ICD-10-CM | POA: Diagnosis not present

## 2024-02-06 DIAGNOSIS — I10 Essential (primary) hypertension: Secondary | ICD-10-CM | POA: Diagnosis not present

## 2024-02-06 DIAGNOSIS — R569 Unspecified convulsions: Secondary | ICD-10-CM | POA: Diagnosis not present

## 2024-02-06 DIAGNOSIS — R293 Abnormal posture: Secondary | ICD-10-CM | POA: Diagnosis not present

## 2024-02-06 DIAGNOSIS — F0390 Unspecified dementia without behavioral disturbance: Secondary | ICD-10-CM | POA: Diagnosis not present

## 2024-02-06 DIAGNOSIS — R41841 Cognitive communication deficit: Secondary | ICD-10-CM | POA: Diagnosis not present

## 2024-02-06 DIAGNOSIS — D649 Anemia, unspecified: Secondary | ICD-10-CM | POA: Diagnosis not present

## 2024-02-06 DIAGNOSIS — R1311 Dysphagia, oral phase: Secondary | ICD-10-CM | POA: Diagnosis not present

## 2024-02-06 DIAGNOSIS — F331 Major depressive disorder, recurrent, moderate: Secondary | ICD-10-CM | POA: Diagnosis not present

## 2024-02-06 DIAGNOSIS — N390 Urinary tract infection, site not specified: Secondary | ICD-10-CM | POA: Diagnosis not present

## 2024-02-06 DIAGNOSIS — T7840XA Allergy, unspecified, initial encounter: Secondary | ICD-10-CM | POA: Diagnosis not present

## 2024-02-06 DIAGNOSIS — M810 Age-related osteoporosis without current pathological fracture: Secondary | ICD-10-CM | POA: Diagnosis not present

## 2024-02-06 DIAGNOSIS — E785 Hyperlipidemia, unspecified: Secondary | ICD-10-CM | POA: Diagnosis not present

## 2024-02-08 DIAGNOSIS — Z6820 Body mass index (BMI) 20.0-20.9, adult: Secondary | ICD-10-CM | POA: Diagnosis not present

## 2024-02-08 DIAGNOSIS — Z8719 Personal history of other diseases of the digestive system: Secondary | ICD-10-CM | POA: Diagnosis not present

## 2024-02-08 DIAGNOSIS — K219 Gastro-esophageal reflux disease without esophagitis: Secondary | ICD-10-CM | POA: Diagnosis not present

## 2024-02-08 DIAGNOSIS — R296 Repeated falls: Secondary | ICD-10-CM | POA: Diagnosis not present

## 2024-02-08 DIAGNOSIS — G311 Senile degeneration of brain, not elsewhere classified: Secondary | ICD-10-CM | POA: Diagnosis not present

## 2024-02-08 DIAGNOSIS — E43 Unspecified severe protein-calorie malnutrition: Secondary | ICD-10-CM | POA: Diagnosis not present

## 2024-02-09 DIAGNOSIS — R296 Repeated falls: Secondary | ICD-10-CM | POA: Diagnosis not present

## 2024-02-09 DIAGNOSIS — Z87440 Personal history of urinary (tract) infections: Secondary | ICD-10-CM | POA: Diagnosis not present

## 2024-02-09 DIAGNOSIS — K219 Gastro-esophageal reflux disease without esophagitis: Secondary | ICD-10-CM | POA: Diagnosis not present

## 2024-02-09 DIAGNOSIS — Z6820 Body mass index (BMI) 20.0-20.9, adult: Secondary | ICD-10-CM | POA: Diagnosis not present

## 2024-02-09 DIAGNOSIS — N1831 Chronic kidney disease, stage 3a: Secondary | ICD-10-CM | POA: Diagnosis not present

## 2024-02-09 DIAGNOSIS — E43 Unspecified severe protein-calorie malnutrition: Secondary | ICD-10-CM | POA: Diagnosis not present

## 2024-02-09 DIAGNOSIS — I129 Hypertensive chronic kidney disease with stage 1 through stage 4 chronic kidney disease, or unspecified chronic kidney disease: Secondary | ICD-10-CM | POA: Diagnosis not present

## 2024-02-09 DIAGNOSIS — E785 Hyperlipidemia, unspecified: Secondary | ICD-10-CM | POA: Diagnosis not present

## 2024-02-09 DIAGNOSIS — I4891 Unspecified atrial fibrillation: Secondary | ICD-10-CM | POA: Diagnosis not present

## 2024-02-09 DIAGNOSIS — Z8719 Personal history of other diseases of the digestive system: Secondary | ICD-10-CM | POA: Diagnosis not present

## 2024-02-09 DIAGNOSIS — G311 Senile degeneration of brain, not elsewhere classified: Secondary | ICD-10-CM | POA: Diagnosis not present

## 2024-02-09 DIAGNOSIS — D631 Anemia in chronic kidney disease: Secondary | ICD-10-CM | POA: Diagnosis not present

## 2024-02-10 DIAGNOSIS — Z6820 Body mass index (BMI) 20.0-20.9, adult: Secondary | ICD-10-CM | POA: Diagnosis not present

## 2024-02-10 DIAGNOSIS — K219 Gastro-esophageal reflux disease without esophagitis: Secondary | ICD-10-CM | POA: Diagnosis not present

## 2024-02-10 DIAGNOSIS — Z8719 Personal history of other diseases of the digestive system: Secondary | ICD-10-CM | POA: Diagnosis not present

## 2024-02-10 DIAGNOSIS — R296 Repeated falls: Secondary | ICD-10-CM | POA: Diagnosis not present

## 2024-02-10 DIAGNOSIS — G311 Senile degeneration of brain, not elsewhere classified: Secondary | ICD-10-CM | POA: Diagnosis not present

## 2024-02-10 DIAGNOSIS — E43 Unspecified severe protein-calorie malnutrition: Secondary | ICD-10-CM | POA: Diagnosis not present

## 2024-02-12 DIAGNOSIS — K219 Gastro-esophageal reflux disease without esophagitis: Secondary | ICD-10-CM | POA: Diagnosis not present

## 2024-02-12 DIAGNOSIS — E43 Unspecified severe protein-calorie malnutrition: Secondary | ICD-10-CM | POA: Diagnosis not present

## 2024-02-12 DIAGNOSIS — Z8719 Personal history of other diseases of the digestive system: Secondary | ICD-10-CM | POA: Diagnosis not present

## 2024-02-12 DIAGNOSIS — R296 Repeated falls: Secondary | ICD-10-CM | POA: Diagnosis not present

## 2024-02-12 DIAGNOSIS — Z6820 Body mass index (BMI) 20.0-20.9, adult: Secondary | ICD-10-CM | POA: Diagnosis not present

## 2024-02-12 DIAGNOSIS — G311 Senile degeneration of brain, not elsewhere classified: Secondary | ICD-10-CM | POA: Diagnosis not present

## 2024-02-15 DIAGNOSIS — Z6820 Body mass index (BMI) 20.0-20.9, adult: Secondary | ICD-10-CM | POA: Diagnosis not present

## 2024-02-15 DIAGNOSIS — Z8719 Personal history of other diseases of the digestive system: Secondary | ICD-10-CM | POA: Diagnosis not present

## 2024-02-15 DIAGNOSIS — R296 Repeated falls: Secondary | ICD-10-CM | POA: Diagnosis not present

## 2024-02-15 DIAGNOSIS — E43 Unspecified severe protein-calorie malnutrition: Secondary | ICD-10-CM | POA: Diagnosis not present

## 2024-02-15 DIAGNOSIS — G311 Senile degeneration of brain, not elsewhere classified: Secondary | ICD-10-CM | POA: Diagnosis not present

## 2024-02-15 DIAGNOSIS — K219 Gastro-esophageal reflux disease without esophagitis: Secondary | ICD-10-CM | POA: Diagnosis not present

## 2024-02-16 DIAGNOSIS — R296 Repeated falls: Secondary | ICD-10-CM | POA: Diagnosis not present

## 2024-02-16 DIAGNOSIS — Z8719 Personal history of other diseases of the digestive system: Secondary | ICD-10-CM | POA: Diagnosis not present

## 2024-02-16 DIAGNOSIS — E43 Unspecified severe protein-calorie malnutrition: Secondary | ICD-10-CM | POA: Diagnosis not present

## 2024-02-16 DIAGNOSIS — G311 Senile degeneration of brain, not elsewhere classified: Secondary | ICD-10-CM | POA: Diagnosis not present

## 2024-02-16 DIAGNOSIS — K219 Gastro-esophageal reflux disease without esophagitis: Secondary | ICD-10-CM | POA: Diagnosis not present

## 2024-02-16 DIAGNOSIS — Z6820 Body mass index (BMI) 20.0-20.9, adult: Secondary | ICD-10-CM | POA: Diagnosis not present

## 2024-02-17 DIAGNOSIS — G311 Senile degeneration of brain, not elsewhere classified: Secondary | ICD-10-CM | POA: Diagnosis not present

## 2024-02-17 DIAGNOSIS — Z6820 Body mass index (BMI) 20.0-20.9, adult: Secondary | ICD-10-CM | POA: Diagnosis not present

## 2024-02-17 DIAGNOSIS — E43 Unspecified severe protein-calorie malnutrition: Secondary | ICD-10-CM | POA: Diagnosis not present

## 2024-02-17 DIAGNOSIS — Z8719 Personal history of other diseases of the digestive system: Secondary | ICD-10-CM | POA: Diagnosis not present

## 2024-02-17 DIAGNOSIS — R296 Repeated falls: Secondary | ICD-10-CM | POA: Diagnosis not present

## 2024-02-17 DIAGNOSIS — K219 Gastro-esophageal reflux disease without esophagitis: Secondary | ICD-10-CM | POA: Diagnosis not present

## 2024-02-18 DIAGNOSIS — R296 Repeated falls: Secondary | ICD-10-CM | POA: Diagnosis not present

## 2024-02-18 DIAGNOSIS — Z6820 Body mass index (BMI) 20.0-20.9, adult: Secondary | ICD-10-CM | POA: Diagnosis not present

## 2024-02-18 DIAGNOSIS — K219 Gastro-esophageal reflux disease without esophagitis: Secondary | ICD-10-CM | POA: Diagnosis not present

## 2024-02-18 DIAGNOSIS — Z8719 Personal history of other diseases of the digestive system: Secondary | ICD-10-CM | POA: Diagnosis not present

## 2024-02-18 DIAGNOSIS — E43 Unspecified severe protein-calorie malnutrition: Secondary | ICD-10-CM | POA: Diagnosis not present

## 2024-02-18 DIAGNOSIS — G311 Senile degeneration of brain, not elsewhere classified: Secondary | ICD-10-CM | POA: Diagnosis not present

## 2024-02-19 DIAGNOSIS — Z6820 Body mass index (BMI) 20.0-20.9, adult: Secondary | ICD-10-CM | POA: Diagnosis not present

## 2024-02-19 DIAGNOSIS — E43 Unspecified severe protein-calorie malnutrition: Secondary | ICD-10-CM | POA: Diagnosis not present

## 2024-02-19 DIAGNOSIS — Z8719 Personal history of other diseases of the digestive system: Secondary | ICD-10-CM | POA: Diagnosis not present

## 2024-02-19 DIAGNOSIS — K219 Gastro-esophageal reflux disease without esophagitis: Secondary | ICD-10-CM | POA: Diagnosis not present

## 2024-02-19 DIAGNOSIS — G311 Senile degeneration of brain, not elsewhere classified: Secondary | ICD-10-CM | POA: Diagnosis not present

## 2024-02-19 DIAGNOSIS — R296 Repeated falls: Secondary | ICD-10-CM | POA: Diagnosis not present

## 2024-02-21 DIAGNOSIS — G311 Senile degeneration of brain, not elsewhere classified: Secondary | ICD-10-CM | POA: Diagnosis not present

## 2024-02-21 DIAGNOSIS — R296 Repeated falls: Secondary | ICD-10-CM | POA: Diagnosis not present

## 2024-02-21 DIAGNOSIS — E43 Unspecified severe protein-calorie malnutrition: Secondary | ICD-10-CM | POA: Diagnosis not present

## 2024-02-21 DIAGNOSIS — K219 Gastro-esophageal reflux disease without esophagitis: Secondary | ICD-10-CM | POA: Diagnosis not present

## 2024-02-21 DIAGNOSIS — Z8719 Personal history of other diseases of the digestive system: Secondary | ICD-10-CM | POA: Diagnosis not present

## 2024-02-21 DIAGNOSIS — Z6820 Body mass index (BMI) 20.0-20.9, adult: Secondary | ICD-10-CM | POA: Diagnosis not present

## 2024-02-22 DIAGNOSIS — Z8719 Personal history of other diseases of the digestive system: Secondary | ICD-10-CM | POA: Diagnosis not present

## 2024-02-22 DIAGNOSIS — G311 Senile degeneration of brain, not elsewhere classified: Secondary | ICD-10-CM | POA: Diagnosis not present

## 2024-02-22 DIAGNOSIS — Z6820 Body mass index (BMI) 20.0-20.9, adult: Secondary | ICD-10-CM | POA: Diagnosis not present

## 2024-02-22 DIAGNOSIS — K219 Gastro-esophageal reflux disease without esophagitis: Secondary | ICD-10-CM | POA: Diagnosis not present

## 2024-02-22 DIAGNOSIS — E43 Unspecified severe protein-calorie malnutrition: Secondary | ICD-10-CM | POA: Diagnosis not present

## 2024-02-22 DIAGNOSIS — R296 Repeated falls: Secondary | ICD-10-CM | POA: Diagnosis not present

## 2024-02-24 DIAGNOSIS — G311 Senile degeneration of brain, not elsewhere classified: Secondary | ICD-10-CM | POA: Diagnosis not present

## 2024-02-24 DIAGNOSIS — K219 Gastro-esophageal reflux disease without esophagitis: Secondary | ICD-10-CM | POA: Diagnosis not present

## 2024-02-24 DIAGNOSIS — Z8719 Personal history of other diseases of the digestive system: Secondary | ICD-10-CM | POA: Diagnosis not present

## 2024-02-24 DIAGNOSIS — R296 Repeated falls: Secondary | ICD-10-CM | POA: Diagnosis not present

## 2024-02-24 DIAGNOSIS — Z6820 Body mass index (BMI) 20.0-20.9, adult: Secondary | ICD-10-CM | POA: Diagnosis not present

## 2024-02-24 DIAGNOSIS — E43 Unspecified severe protein-calorie malnutrition: Secondary | ICD-10-CM | POA: Diagnosis not present

## 2024-02-25 DIAGNOSIS — R1311 Dysphagia, oral phase: Secondary | ICD-10-CM | POA: Diagnosis not present

## 2024-02-25 DIAGNOSIS — F331 Major depressive disorder, recurrent, moderate: Secondary | ICD-10-CM | POA: Diagnosis not present

## 2024-02-25 DIAGNOSIS — M810 Age-related osteoporosis without current pathological fracture: Secondary | ICD-10-CM | POA: Diagnosis not present

## 2024-02-25 DIAGNOSIS — R569 Unspecified convulsions: Secondary | ICD-10-CM | POA: Diagnosis not present

## 2024-02-25 DIAGNOSIS — T7840XA Allergy, unspecified, initial encounter: Secondary | ICD-10-CM | POA: Diagnosis not present

## 2024-02-25 DIAGNOSIS — R293 Abnormal posture: Secondary | ICD-10-CM | POA: Diagnosis not present

## 2024-02-25 DIAGNOSIS — R41841 Cognitive communication deficit: Secondary | ICD-10-CM | POA: Diagnosis not present

## 2024-02-25 DIAGNOSIS — F0390 Unspecified dementia without behavioral disturbance: Secondary | ICD-10-CM | POA: Diagnosis not present

## 2024-02-25 DIAGNOSIS — E785 Hyperlipidemia, unspecified: Secondary | ICD-10-CM | POA: Diagnosis not present

## 2024-02-25 DIAGNOSIS — I10 Essential (primary) hypertension: Secondary | ICD-10-CM | POA: Diagnosis not present

## 2024-02-25 DIAGNOSIS — N390 Urinary tract infection, site not specified: Secondary | ICD-10-CM | POA: Diagnosis not present

## 2024-02-25 DIAGNOSIS — D649 Anemia, unspecified: Secondary | ICD-10-CM | POA: Diagnosis not present

## 2024-02-26 DIAGNOSIS — R293 Abnormal posture: Secondary | ICD-10-CM | POA: Diagnosis not present

## 2024-02-26 DIAGNOSIS — E785 Hyperlipidemia, unspecified: Secondary | ICD-10-CM | POA: Diagnosis not present

## 2024-02-26 DIAGNOSIS — D649 Anemia, unspecified: Secondary | ICD-10-CM | POA: Diagnosis not present

## 2024-02-26 DIAGNOSIS — F0390 Unspecified dementia without behavioral disturbance: Secondary | ICD-10-CM | POA: Diagnosis not present

## 2024-02-26 DIAGNOSIS — R1311 Dysphagia, oral phase: Secondary | ICD-10-CM | POA: Diagnosis not present

## 2024-02-26 DIAGNOSIS — N390 Urinary tract infection, site not specified: Secondary | ICD-10-CM | POA: Diagnosis not present

## 2024-02-26 DIAGNOSIS — M810 Age-related osteoporosis without current pathological fracture: Secondary | ICD-10-CM | POA: Diagnosis not present

## 2024-02-26 DIAGNOSIS — R569 Unspecified convulsions: Secondary | ICD-10-CM | POA: Diagnosis not present

## 2024-02-26 DIAGNOSIS — F331 Major depressive disorder, recurrent, moderate: Secondary | ICD-10-CM | POA: Diagnosis not present

## 2024-02-26 DIAGNOSIS — T7840XA Allergy, unspecified, initial encounter: Secondary | ICD-10-CM | POA: Diagnosis not present

## 2024-02-26 DIAGNOSIS — I10 Essential (primary) hypertension: Secondary | ICD-10-CM | POA: Diagnosis not present

## 2024-02-26 DIAGNOSIS — R41841 Cognitive communication deficit: Secondary | ICD-10-CM | POA: Diagnosis not present

## 2024-02-27 DIAGNOSIS — N390 Urinary tract infection, site not specified: Secondary | ICD-10-CM | POA: Diagnosis not present

## 2024-02-27 DIAGNOSIS — T7840XA Allergy, unspecified, initial encounter: Secondary | ICD-10-CM | POA: Diagnosis not present

## 2024-02-27 DIAGNOSIS — R293 Abnormal posture: Secondary | ICD-10-CM | POA: Diagnosis not present

## 2024-02-27 DIAGNOSIS — D649 Anemia, unspecified: Secondary | ICD-10-CM | POA: Diagnosis not present

## 2024-02-27 DIAGNOSIS — R569 Unspecified convulsions: Secondary | ICD-10-CM | POA: Diagnosis not present

## 2024-02-27 DIAGNOSIS — R1311 Dysphagia, oral phase: Secondary | ICD-10-CM | POA: Diagnosis not present

## 2024-02-27 DIAGNOSIS — R41841 Cognitive communication deficit: Secondary | ICD-10-CM | POA: Diagnosis not present

## 2024-02-27 DIAGNOSIS — F331 Major depressive disorder, recurrent, moderate: Secondary | ICD-10-CM | POA: Diagnosis not present

## 2024-02-27 DIAGNOSIS — E785 Hyperlipidemia, unspecified: Secondary | ICD-10-CM | POA: Diagnosis not present

## 2024-02-27 DIAGNOSIS — F0390 Unspecified dementia without behavioral disturbance: Secondary | ICD-10-CM | POA: Diagnosis not present

## 2024-02-27 DIAGNOSIS — I10 Essential (primary) hypertension: Secondary | ICD-10-CM | POA: Diagnosis not present

## 2024-02-27 DIAGNOSIS — M810 Age-related osteoporosis without current pathological fracture: Secondary | ICD-10-CM | POA: Diagnosis not present

## 2024-03-02 DIAGNOSIS — K219 Gastro-esophageal reflux disease without esophagitis: Secondary | ICD-10-CM | POA: Diagnosis not present

## 2024-03-02 DIAGNOSIS — G311 Senile degeneration of brain, not elsewhere classified: Secondary | ICD-10-CM | POA: Diagnosis not present

## 2024-03-02 DIAGNOSIS — R296 Repeated falls: Secondary | ICD-10-CM | POA: Diagnosis not present

## 2024-03-02 DIAGNOSIS — Z8719 Personal history of other diseases of the digestive system: Secondary | ICD-10-CM | POA: Diagnosis not present

## 2024-03-02 DIAGNOSIS — E43 Unspecified severe protein-calorie malnutrition: Secondary | ICD-10-CM | POA: Diagnosis not present

## 2024-03-02 DIAGNOSIS — Z6820 Body mass index (BMI) 20.0-20.9, adult: Secondary | ICD-10-CM | POA: Diagnosis not present

## 2024-03-03 DIAGNOSIS — G311 Senile degeneration of brain, not elsewhere classified: Secondary | ICD-10-CM | POA: Diagnosis not present

## 2024-03-03 DIAGNOSIS — R296 Repeated falls: Secondary | ICD-10-CM | POA: Diagnosis not present

## 2024-03-03 DIAGNOSIS — Z8719 Personal history of other diseases of the digestive system: Secondary | ICD-10-CM | POA: Diagnosis not present

## 2024-03-03 DIAGNOSIS — Z6820 Body mass index (BMI) 20.0-20.9, adult: Secondary | ICD-10-CM | POA: Diagnosis not present

## 2024-03-03 DIAGNOSIS — K219 Gastro-esophageal reflux disease without esophagitis: Secondary | ICD-10-CM | POA: Diagnosis not present

## 2024-03-03 DIAGNOSIS — E43 Unspecified severe protein-calorie malnutrition: Secondary | ICD-10-CM | POA: Diagnosis not present

## 2024-03-04 DIAGNOSIS — Z6820 Body mass index (BMI) 20.0-20.9, adult: Secondary | ICD-10-CM | POA: Diagnosis not present

## 2024-03-04 DIAGNOSIS — R296 Repeated falls: Secondary | ICD-10-CM | POA: Diagnosis not present

## 2024-03-04 DIAGNOSIS — Z8719 Personal history of other diseases of the digestive system: Secondary | ICD-10-CM | POA: Diagnosis not present

## 2024-03-04 DIAGNOSIS — G311 Senile degeneration of brain, not elsewhere classified: Secondary | ICD-10-CM | POA: Diagnosis not present

## 2024-03-04 DIAGNOSIS — K219 Gastro-esophageal reflux disease without esophagitis: Secondary | ICD-10-CM | POA: Diagnosis not present

## 2024-03-04 DIAGNOSIS — E43 Unspecified severe protein-calorie malnutrition: Secondary | ICD-10-CM | POA: Diagnosis not present

## 2024-03-06 DIAGNOSIS — F331 Major depressive disorder, recurrent, moderate: Secondary | ICD-10-CM | POA: Diagnosis not present

## 2024-03-06 DIAGNOSIS — D649 Anemia, unspecified: Secondary | ICD-10-CM | POA: Diagnosis not present

## 2024-03-06 DIAGNOSIS — R293 Abnormal posture: Secondary | ICD-10-CM | POA: Diagnosis not present

## 2024-03-06 DIAGNOSIS — R1311 Dysphagia, oral phase: Secondary | ICD-10-CM | POA: Diagnosis not present

## 2024-03-06 DIAGNOSIS — E785 Hyperlipidemia, unspecified: Secondary | ICD-10-CM | POA: Diagnosis not present

## 2024-03-06 DIAGNOSIS — M810 Age-related osteoporosis without current pathological fracture: Secondary | ICD-10-CM | POA: Diagnosis not present

## 2024-03-06 DIAGNOSIS — R41841 Cognitive communication deficit: Secondary | ICD-10-CM | POA: Diagnosis not present

## 2024-03-06 DIAGNOSIS — R569 Unspecified convulsions: Secondary | ICD-10-CM | POA: Diagnosis not present

## 2024-03-06 DIAGNOSIS — I10 Essential (primary) hypertension: Secondary | ICD-10-CM | POA: Diagnosis not present

## 2024-03-06 DIAGNOSIS — F0390 Unspecified dementia without behavioral disturbance: Secondary | ICD-10-CM | POA: Diagnosis not present

## 2024-03-06 DIAGNOSIS — T7840XA Allergy, unspecified, initial encounter: Secondary | ICD-10-CM | POA: Diagnosis not present

## 2024-03-06 DIAGNOSIS — N390 Urinary tract infection, site not specified: Secondary | ICD-10-CM | POA: Diagnosis not present

## 2024-03-07 DIAGNOSIS — K219 Gastro-esophageal reflux disease without esophagitis: Secondary | ICD-10-CM | POA: Diagnosis not present

## 2024-03-07 DIAGNOSIS — E43 Unspecified severe protein-calorie malnutrition: Secondary | ICD-10-CM | POA: Diagnosis not present

## 2024-03-07 DIAGNOSIS — G311 Senile degeneration of brain, not elsewhere classified: Secondary | ICD-10-CM | POA: Diagnosis not present

## 2024-03-07 DIAGNOSIS — R296 Repeated falls: Secondary | ICD-10-CM | POA: Diagnosis not present

## 2024-03-07 DIAGNOSIS — Z6820 Body mass index (BMI) 20.0-20.9, adult: Secondary | ICD-10-CM | POA: Diagnosis not present

## 2024-03-07 DIAGNOSIS — Z8719 Personal history of other diseases of the digestive system: Secondary | ICD-10-CM | POA: Diagnosis not present

## 2024-03-09 DIAGNOSIS — Z6820 Body mass index (BMI) 20.0-20.9, adult: Secondary | ICD-10-CM | POA: Diagnosis not present

## 2024-03-09 DIAGNOSIS — Z8719 Personal history of other diseases of the digestive system: Secondary | ICD-10-CM | POA: Diagnosis not present

## 2024-03-09 DIAGNOSIS — E43 Unspecified severe protein-calorie malnutrition: Secondary | ICD-10-CM | POA: Diagnosis not present

## 2024-03-09 DIAGNOSIS — G311 Senile degeneration of brain, not elsewhere classified: Secondary | ICD-10-CM | POA: Diagnosis not present

## 2024-03-09 DIAGNOSIS — R296 Repeated falls: Secondary | ICD-10-CM | POA: Diagnosis not present

## 2024-03-09 DIAGNOSIS — K219 Gastro-esophageal reflux disease without esophagitis: Secondary | ICD-10-CM | POA: Diagnosis not present

## 2024-03-10 DIAGNOSIS — I4891 Unspecified atrial fibrillation: Secondary | ICD-10-CM | POA: Diagnosis not present

## 2024-03-10 DIAGNOSIS — K219 Gastro-esophageal reflux disease without esophagitis: Secondary | ICD-10-CM | POA: Diagnosis not present

## 2024-03-10 DIAGNOSIS — D631 Anemia in chronic kidney disease: Secondary | ICD-10-CM | POA: Diagnosis not present

## 2024-03-10 DIAGNOSIS — Z87440 Personal history of urinary (tract) infections: Secondary | ICD-10-CM | POA: Diagnosis not present

## 2024-03-10 DIAGNOSIS — Z8719 Personal history of other diseases of the digestive system: Secondary | ICD-10-CM | POA: Diagnosis not present

## 2024-03-10 DIAGNOSIS — Z6820 Body mass index (BMI) 20.0-20.9, adult: Secondary | ICD-10-CM | POA: Diagnosis not present

## 2024-03-10 DIAGNOSIS — E43 Unspecified severe protein-calorie malnutrition: Secondary | ICD-10-CM | POA: Diagnosis not present

## 2024-03-10 DIAGNOSIS — N1831 Chronic kidney disease, stage 3a: Secondary | ICD-10-CM | POA: Diagnosis not present

## 2024-03-10 DIAGNOSIS — G311 Senile degeneration of brain, not elsewhere classified: Secondary | ICD-10-CM | POA: Diagnosis not present

## 2024-03-10 DIAGNOSIS — I129 Hypertensive chronic kidney disease with stage 1 through stage 4 chronic kidney disease, or unspecified chronic kidney disease: Secondary | ICD-10-CM | POA: Diagnosis not present

## 2024-03-10 DIAGNOSIS — E785 Hyperlipidemia, unspecified: Secondary | ICD-10-CM | POA: Diagnosis not present

## 2024-03-10 DIAGNOSIS — R296 Repeated falls: Secondary | ICD-10-CM | POA: Diagnosis not present

## 2024-03-11 DIAGNOSIS — R296 Repeated falls: Secondary | ICD-10-CM | POA: Diagnosis not present

## 2024-03-11 DIAGNOSIS — Z6820 Body mass index (BMI) 20.0-20.9, adult: Secondary | ICD-10-CM | POA: Diagnosis not present

## 2024-03-11 DIAGNOSIS — E43 Unspecified severe protein-calorie malnutrition: Secondary | ICD-10-CM | POA: Diagnosis not present

## 2024-03-11 DIAGNOSIS — Z8719 Personal history of other diseases of the digestive system: Secondary | ICD-10-CM | POA: Diagnosis not present

## 2024-03-11 DIAGNOSIS — G311 Senile degeneration of brain, not elsewhere classified: Secondary | ICD-10-CM | POA: Diagnosis not present

## 2024-03-11 DIAGNOSIS — K219 Gastro-esophageal reflux disease without esophagitis: Secondary | ICD-10-CM | POA: Diagnosis not present

## 2024-03-14 DIAGNOSIS — Z6820 Body mass index (BMI) 20.0-20.9, adult: Secondary | ICD-10-CM | POA: Diagnosis not present

## 2024-03-14 DIAGNOSIS — R296 Repeated falls: Secondary | ICD-10-CM | POA: Diagnosis not present

## 2024-03-14 DIAGNOSIS — K219 Gastro-esophageal reflux disease without esophagitis: Secondary | ICD-10-CM | POA: Diagnosis not present

## 2024-03-14 DIAGNOSIS — G311 Senile degeneration of brain, not elsewhere classified: Secondary | ICD-10-CM | POA: Diagnosis not present

## 2024-03-14 DIAGNOSIS — Z8719 Personal history of other diseases of the digestive system: Secondary | ICD-10-CM | POA: Diagnosis not present

## 2024-03-14 DIAGNOSIS — E43 Unspecified severe protein-calorie malnutrition: Secondary | ICD-10-CM | POA: Diagnosis not present

## 2024-03-16 DIAGNOSIS — K219 Gastro-esophageal reflux disease without esophagitis: Secondary | ICD-10-CM | POA: Diagnosis not present

## 2024-03-16 DIAGNOSIS — Z6820 Body mass index (BMI) 20.0-20.9, adult: Secondary | ICD-10-CM | POA: Diagnosis not present

## 2024-03-16 DIAGNOSIS — G311 Senile degeneration of brain, not elsewhere classified: Secondary | ICD-10-CM | POA: Diagnosis not present

## 2024-03-16 DIAGNOSIS — R296 Repeated falls: Secondary | ICD-10-CM | POA: Diagnosis not present

## 2024-03-16 DIAGNOSIS — Z8719 Personal history of other diseases of the digestive system: Secondary | ICD-10-CM | POA: Diagnosis not present

## 2024-03-16 DIAGNOSIS — E43 Unspecified severe protein-calorie malnutrition: Secondary | ICD-10-CM | POA: Diagnosis not present

## 2024-03-17 DIAGNOSIS — Z6820 Body mass index (BMI) 20.0-20.9, adult: Secondary | ICD-10-CM | POA: Diagnosis not present

## 2024-03-17 DIAGNOSIS — G311 Senile degeneration of brain, not elsewhere classified: Secondary | ICD-10-CM | POA: Diagnosis not present

## 2024-03-17 DIAGNOSIS — Z8719 Personal history of other diseases of the digestive system: Secondary | ICD-10-CM | POA: Diagnosis not present

## 2024-03-17 DIAGNOSIS — R296 Repeated falls: Secondary | ICD-10-CM | POA: Diagnosis not present

## 2024-03-17 DIAGNOSIS — K219 Gastro-esophageal reflux disease without esophagitis: Secondary | ICD-10-CM | POA: Diagnosis not present

## 2024-03-17 DIAGNOSIS — E43 Unspecified severe protein-calorie malnutrition: Secondary | ICD-10-CM | POA: Diagnosis not present

## 2024-03-19 DIAGNOSIS — Z8719 Personal history of other diseases of the digestive system: Secondary | ICD-10-CM | POA: Diagnosis not present

## 2024-03-19 DIAGNOSIS — K219 Gastro-esophageal reflux disease without esophagitis: Secondary | ICD-10-CM | POA: Diagnosis not present

## 2024-03-19 DIAGNOSIS — Z6820 Body mass index (BMI) 20.0-20.9, adult: Secondary | ICD-10-CM | POA: Diagnosis not present

## 2024-03-19 DIAGNOSIS — E43 Unspecified severe protein-calorie malnutrition: Secondary | ICD-10-CM | POA: Diagnosis not present

## 2024-03-19 DIAGNOSIS — G311 Senile degeneration of brain, not elsewhere classified: Secondary | ICD-10-CM | POA: Diagnosis not present

## 2024-03-19 DIAGNOSIS — R296 Repeated falls: Secondary | ICD-10-CM | POA: Diagnosis not present

## 2024-03-21 DIAGNOSIS — R293 Abnormal posture: Secondary | ICD-10-CM | POA: Diagnosis not present

## 2024-03-21 DIAGNOSIS — K219 Gastro-esophageal reflux disease without esophagitis: Secondary | ICD-10-CM | POA: Diagnosis not present

## 2024-03-21 DIAGNOSIS — F0390 Unspecified dementia without behavioral disturbance: Secondary | ICD-10-CM | POA: Diagnosis not present

## 2024-03-21 DIAGNOSIS — E43 Unspecified severe protein-calorie malnutrition: Secondary | ICD-10-CM | POA: Diagnosis not present

## 2024-03-21 DIAGNOSIS — T7840XA Allergy, unspecified, initial encounter: Secondary | ICD-10-CM | POA: Diagnosis not present

## 2024-03-21 DIAGNOSIS — Z8719 Personal history of other diseases of the digestive system: Secondary | ICD-10-CM | POA: Diagnosis not present

## 2024-03-21 DIAGNOSIS — E785 Hyperlipidemia, unspecified: Secondary | ICD-10-CM | POA: Diagnosis not present

## 2024-03-21 DIAGNOSIS — M810 Age-related osteoporosis without current pathological fracture: Secondary | ICD-10-CM | POA: Diagnosis not present

## 2024-03-21 DIAGNOSIS — G311 Senile degeneration of brain, not elsewhere classified: Secondary | ICD-10-CM | POA: Diagnosis not present

## 2024-03-21 DIAGNOSIS — N390 Urinary tract infection, site not specified: Secondary | ICD-10-CM | POA: Diagnosis not present

## 2024-03-21 DIAGNOSIS — I10 Essential (primary) hypertension: Secondary | ICD-10-CM | POA: Diagnosis not present

## 2024-03-21 DIAGNOSIS — F331 Major depressive disorder, recurrent, moderate: Secondary | ICD-10-CM | POA: Diagnosis not present

## 2024-03-21 DIAGNOSIS — R296 Repeated falls: Secondary | ICD-10-CM | POA: Diagnosis not present

## 2024-03-21 DIAGNOSIS — R41841 Cognitive communication deficit: Secondary | ICD-10-CM | POA: Diagnosis not present

## 2024-03-21 DIAGNOSIS — D649 Anemia, unspecified: Secondary | ICD-10-CM | POA: Diagnosis not present

## 2024-03-21 DIAGNOSIS — Z6820 Body mass index (BMI) 20.0-20.9, adult: Secondary | ICD-10-CM | POA: Diagnosis not present

## 2024-03-21 DIAGNOSIS — R569 Unspecified convulsions: Secondary | ICD-10-CM | POA: Diagnosis not present

## 2024-03-21 DIAGNOSIS — R1311 Dysphagia, oral phase: Secondary | ICD-10-CM | POA: Diagnosis not present

## 2024-03-22 DIAGNOSIS — Z6820 Body mass index (BMI) 20.0-20.9, adult: Secondary | ICD-10-CM | POA: Diagnosis not present

## 2024-03-22 DIAGNOSIS — E43 Unspecified severe protein-calorie malnutrition: Secondary | ICD-10-CM | POA: Diagnosis not present

## 2024-03-22 DIAGNOSIS — R296 Repeated falls: Secondary | ICD-10-CM | POA: Diagnosis not present

## 2024-03-22 DIAGNOSIS — Z8719 Personal history of other diseases of the digestive system: Secondary | ICD-10-CM | POA: Diagnosis not present

## 2024-03-22 DIAGNOSIS — K219 Gastro-esophageal reflux disease without esophagitis: Secondary | ICD-10-CM | POA: Diagnosis not present

## 2024-03-22 DIAGNOSIS — G311 Senile degeneration of brain, not elsewhere classified: Secondary | ICD-10-CM | POA: Diagnosis not present

## 2024-03-23 DIAGNOSIS — G311 Senile degeneration of brain, not elsewhere classified: Secondary | ICD-10-CM | POA: Diagnosis not present

## 2024-03-23 DIAGNOSIS — K219 Gastro-esophageal reflux disease without esophagitis: Secondary | ICD-10-CM | POA: Diagnosis not present

## 2024-03-23 DIAGNOSIS — R296 Repeated falls: Secondary | ICD-10-CM | POA: Diagnosis not present

## 2024-03-23 DIAGNOSIS — D649 Anemia, unspecified: Secondary | ICD-10-CM | POA: Diagnosis not present

## 2024-03-23 DIAGNOSIS — F331 Major depressive disorder, recurrent, moderate: Secondary | ICD-10-CM | POA: Diagnosis not present

## 2024-03-23 DIAGNOSIS — Z8719 Personal history of other diseases of the digestive system: Secondary | ICD-10-CM | POA: Diagnosis not present

## 2024-03-23 DIAGNOSIS — E785 Hyperlipidemia, unspecified: Secondary | ICD-10-CM | POA: Diagnosis not present

## 2024-03-23 DIAGNOSIS — Z6820 Body mass index (BMI) 20.0-20.9, adult: Secondary | ICD-10-CM | POA: Diagnosis not present

## 2024-03-23 DIAGNOSIS — F0390 Unspecified dementia without behavioral disturbance: Secondary | ICD-10-CM | POA: Diagnosis not present

## 2024-03-23 DIAGNOSIS — E43 Unspecified severe protein-calorie malnutrition: Secondary | ICD-10-CM | POA: Diagnosis not present

## 2024-03-25 DIAGNOSIS — Z8719 Personal history of other diseases of the digestive system: Secondary | ICD-10-CM | POA: Diagnosis not present

## 2024-03-25 DIAGNOSIS — G311 Senile degeneration of brain, not elsewhere classified: Secondary | ICD-10-CM | POA: Diagnosis not present

## 2024-03-25 DIAGNOSIS — Z6820 Body mass index (BMI) 20.0-20.9, adult: Secondary | ICD-10-CM | POA: Diagnosis not present

## 2024-03-25 DIAGNOSIS — E43 Unspecified severe protein-calorie malnutrition: Secondary | ICD-10-CM | POA: Diagnosis not present

## 2024-03-25 DIAGNOSIS — K219 Gastro-esophageal reflux disease without esophagitis: Secondary | ICD-10-CM | POA: Diagnosis not present

## 2024-03-25 DIAGNOSIS — R296 Repeated falls: Secondary | ICD-10-CM | POA: Diagnosis not present

## 2024-03-27 DIAGNOSIS — R569 Unspecified convulsions: Secondary | ICD-10-CM | POA: Diagnosis not present

## 2024-03-27 DIAGNOSIS — F331 Major depressive disorder, recurrent, moderate: Secondary | ICD-10-CM | POA: Diagnosis not present

## 2024-03-27 DIAGNOSIS — N390 Urinary tract infection, site not specified: Secondary | ICD-10-CM | POA: Diagnosis not present

## 2024-03-27 DIAGNOSIS — D649 Anemia, unspecified: Secondary | ICD-10-CM | POA: Diagnosis not present

## 2024-03-27 DIAGNOSIS — R41841 Cognitive communication deficit: Secondary | ICD-10-CM | POA: Diagnosis not present

## 2024-03-27 DIAGNOSIS — R1311 Dysphagia, oral phase: Secondary | ICD-10-CM | POA: Diagnosis not present

## 2024-03-27 DIAGNOSIS — E785 Hyperlipidemia, unspecified: Secondary | ICD-10-CM | POA: Diagnosis not present

## 2024-03-27 DIAGNOSIS — M810 Age-related osteoporosis without current pathological fracture: Secondary | ICD-10-CM | POA: Diagnosis not present

## 2024-03-27 DIAGNOSIS — R293 Abnormal posture: Secondary | ICD-10-CM | POA: Diagnosis not present

## 2024-03-27 DIAGNOSIS — I10 Essential (primary) hypertension: Secondary | ICD-10-CM | POA: Diagnosis not present

## 2024-03-27 DIAGNOSIS — T7840XA Allergy, unspecified, initial encounter: Secondary | ICD-10-CM | POA: Diagnosis not present

## 2024-03-27 DIAGNOSIS — F0390 Unspecified dementia without behavioral disturbance: Secondary | ICD-10-CM | POA: Diagnosis not present

## 2024-03-28 DIAGNOSIS — Z6820 Body mass index (BMI) 20.0-20.9, adult: Secondary | ICD-10-CM | POA: Diagnosis not present

## 2024-03-28 DIAGNOSIS — Z8719 Personal history of other diseases of the digestive system: Secondary | ICD-10-CM | POA: Diagnosis not present

## 2024-03-28 DIAGNOSIS — K219 Gastro-esophageal reflux disease without esophagitis: Secondary | ICD-10-CM | POA: Diagnosis not present

## 2024-03-28 DIAGNOSIS — G311 Senile degeneration of brain, not elsewhere classified: Secondary | ICD-10-CM | POA: Diagnosis not present

## 2024-03-28 DIAGNOSIS — R296 Repeated falls: Secondary | ICD-10-CM | POA: Diagnosis not present

## 2024-03-28 DIAGNOSIS — E43 Unspecified severe protein-calorie malnutrition: Secondary | ICD-10-CM | POA: Diagnosis not present

## 2024-03-29 DIAGNOSIS — G63 Polyneuropathy in diseases classified elsewhere: Secondary | ICD-10-CM | POA: Diagnosis not present

## 2024-03-29 DIAGNOSIS — E785 Hyperlipidemia, unspecified: Secondary | ICD-10-CM | POA: Diagnosis not present

## 2024-03-29 DIAGNOSIS — R293 Abnormal posture: Secondary | ICD-10-CM | POA: Diagnosis not present

## 2024-03-29 DIAGNOSIS — R41841 Cognitive communication deficit: Secondary | ICD-10-CM | POA: Diagnosis not present

## 2024-03-29 DIAGNOSIS — G311 Senile degeneration of brain, not elsewhere classified: Secondary | ICD-10-CM | POA: Diagnosis not present

## 2024-03-29 DIAGNOSIS — F331 Major depressive disorder, recurrent, moderate: Secondary | ICD-10-CM | POA: Diagnosis not present

## 2024-03-29 DIAGNOSIS — N390 Urinary tract infection, site not specified: Secondary | ICD-10-CM | POA: Diagnosis not present

## 2024-03-29 DIAGNOSIS — E43 Unspecified severe protein-calorie malnutrition: Secondary | ICD-10-CM | POA: Diagnosis not present

## 2024-03-29 DIAGNOSIS — R296 Repeated falls: Secondary | ICD-10-CM | POA: Diagnosis not present

## 2024-03-29 DIAGNOSIS — Z6820 Body mass index (BMI) 20.0-20.9, adult: Secondary | ICD-10-CM | POA: Diagnosis not present

## 2024-03-29 DIAGNOSIS — F0390 Unspecified dementia without behavioral disturbance: Secondary | ICD-10-CM | POA: Diagnosis not present

## 2024-03-29 DIAGNOSIS — K219 Gastro-esophageal reflux disease without esophagitis: Secondary | ICD-10-CM | POA: Diagnosis not present

## 2024-03-29 DIAGNOSIS — Z8719 Personal history of other diseases of the digestive system: Secondary | ICD-10-CM | POA: Diagnosis not present

## 2024-03-29 DIAGNOSIS — D649 Anemia, unspecified: Secondary | ICD-10-CM | POA: Diagnosis not present

## 2024-03-29 DIAGNOSIS — R569 Unspecified convulsions: Secondary | ICD-10-CM | POA: Diagnosis not present

## 2024-03-29 DIAGNOSIS — M810 Age-related osteoporosis without current pathological fracture: Secondary | ICD-10-CM | POA: Diagnosis not present

## 2024-03-29 DIAGNOSIS — T7840XA Allergy, unspecified, initial encounter: Secondary | ICD-10-CM | POA: Diagnosis not present

## 2024-03-29 DIAGNOSIS — R1311 Dysphagia, oral phase: Secondary | ICD-10-CM | POA: Diagnosis not present

## 2024-03-30 DIAGNOSIS — Z6820 Body mass index (BMI) 20.0-20.9, adult: Secondary | ICD-10-CM | POA: Diagnosis not present

## 2024-03-30 DIAGNOSIS — Z8719 Personal history of other diseases of the digestive system: Secondary | ICD-10-CM | POA: Diagnosis not present

## 2024-03-30 DIAGNOSIS — E43 Unspecified severe protein-calorie malnutrition: Secondary | ICD-10-CM | POA: Diagnosis not present

## 2024-03-30 DIAGNOSIS — R296 Repeated falls: Secondary | ICD-10-CM | POA: Diagnosis not present

## 2024-03-30 DIAGNOSIS — K219 Gastro-esophageal reflux disease without esophagitis: Secondary | ICD-10-CM | POA: Diagnosis not present

## 2024-03-30 DIAGNOSIS — G311 Senile degeneration of brain, not elsewhere classified: Secondary | ICD-10-CM | POA: Diagnosis not present

## 2024-03-31 DIAGNOSIS — E43 Unspecified severe protein-calorie malnutrition: Secondary | ICD-10-CM | POA: Diagnosis not present

## 2024-03-31 DIAGNOSIS — R296 Repeated falls: Secondary | ICD-10-CM | POA: Diagnosis not present

## 2024-03-31 DIAGNOSIS — Z8719 Personal history of other diseases of the digestive system: Secondary | ICD-10-CM | POA: Diagnosis not present

## 2024-03-31 DIAGNOSIS — G311 Senile degeneration of brain, not elsewhere classified: Secondary | ICD-10-CM | POA: Diagnosis not present

## 2024-03-31 DIAGNOSIS — Z6820 Body mass index (BMI) 20.0-20.9, adult: Secondary | ICD-10-CM | POA: Diagnosis not present

## 2024-03-31 DIAGNOSIS — K219 Gastro-esophageal reflux disease without esophagitis: Secondary | ICD-10-CM | POA: Diagnosis not present

## 2024-04-02 DIAGNOSIS — Z6820 Body mass index (BMI) 20.0-20.9, adult: Secondary | ICD-10-CM | POA: Diagnosis not present

## 2024-04-02 DIAGNOSIS — E43 Unspecified severe protein-calorie malnutrition: Secondary | ICD-10-CM | POA: Diagnosis not present

## 2024-04-02 DIAGNOSIS — G311 Senile degeneration of brain, not elsewhere classified: Secondary | ICD-10-CM | POA: Diagnosis not present

## 2024-04-02 DIAGNOSIS — Z8719 Personal history of other diseases of the digestive system: Secondary | ICD-10-CM | POA: Diagnosis not present

## 2024-04-02 DIAGNOSIS — K219 Gastro-esophageal reflux disease without esophagitis: Secondary | ICD-10-CM | POA: Diagnosis not present

## 2024-04-02 DIAGNOSIS — R296 Repeated falls: Secondary | ICD-10-CM | POA: Diagnosis not present

## 2024-04-04 DIAGNOSIS — K219 Gastro-esophageal reflux disease without esophagitis: Secondary | ICD-10-CM | POA: Diagnosis not present

## 2024-04-04 DIAGNOSIS — E43 Unspecified severe protein-calorie malnutrition: Secondary | ICD-10-CM | POA: Diagnosis not present

## 2024-04-04 DIAGNOSIS — R296 Repeated falls: Secondary | ICD-10-CM | POA: Diagnosis not present

## 2024-04-04 DIAGNOSIS — Z6820 Body mass index (BMI) 20.0-20.9, adult: Secondary | ICD-10-CM | POA: Diagnosis not present

## 2024-04-04 DIAGNOSIS — Z8719 Personal history of other diseases of the digestive system: Secondary | ICD-10-CM | POA: Diagnosis not present

## 2024-04-04 DIAGNOSIS — G311 Senile degeneration of brain, not elsewhere classified: Secondary | ICD-10-CM | POA: Diagnosis not present

## 2024-04-06 DIAGNOSIS — G311 Senile degeneration of brain, not elsewhere classified: Secondary | ICD-10-CM | POA: Diagnosis not present

## 2024-04-06 DIAGNOSIS — K219 Gastro-esophageal reflux disease without esophagitis: Secondary | ICD-10-CM | POA: Diagnosis not present

## 2024-04-06 DIAGNOSIS — R296 Repeated falls: Secondary | ICD-10-CM | POA: Diagnosis not present

## 2024-04-06 DIAGNOSIS — E43 Unspecified severe protein-calorie malnutrition: Secondary | ICD-10-CM | POA: Diagnosis not present

## 2024-04-06 DIAGNOSIS — Z8719 Personal history of other diseases of the digestive system: Secondary | ICD-10-CM | POA: Diagnosis not present

## 2024-04-06 DIAGNOSIS — Z6820 Body mass index (BMI) 20.0-20.9, adult: Secondary | ICD-10-CM | POA: Diagnosis not present

## 2024-04-08 DIAGNOSIS — G311 Senile degeneration of brain, not elsewhere classified: Secondary | ICD-10-CM | POA: Diagnosis not present

## 2024-04-08 DIAGNOSIS — Z6820 Body mass index (BMI) 20.0-20.9, adult: Secondary | ICD-10-CM | POA: Diagnosis not present

## 2024-04-08 DIAGNOSIS — Z8719 Personal history of other diseases of the digestive system: Secondary | ICD-10-CM | POA: Diagnosis not present

## 2024-04-08 DIAGNOSIS — R296 Repeated falls: Secondary | ICD-10-CM | POA: Diagnosis not present

## 2024-04-08 DIAGNOSIS — K219 Gastro-esophageal reflux disease without esophagitis: Secondary | ICD-10-CM | POA: Diagnosis not present

## 2024-04-08 DIAGNOSIS — E43 Unspecified severe protein-calorie malnutrition: Secondary | ICD-10-CM | POA: Diagnosis not present

## 2024-04-10 DIAGNOSIS — I4891 Unspecified atrial fibrillation: Secondary | ICD-10-CM | POA: Diagnosis not present

## 2024-04-10 DIAGNOSIS — Z87440 Personal history of urinary (tract) infections: Secondary | ICD-10-CM | POA: Diagnosis not present

## 2024-04-10 DIAGNOSIS — G311 Senile degeneration of brain, not elsewhere classified: Secondary | ICD-10-CM | POA: Diagnosis not present

## 2024-04-10 DIAGNOSIS — D631 Anemia in chronic kidney disease: Secondary | ICD-10-CM | POA: Diagnosis not present

## 2024-04-10 DIAGNOSIS — I129 Hypertensive chronic kidney disease with stage 1 through stage 4 chronic kidney disease, or unspecified chronic kidney disease: Secondary | ICD-10-CM | POA: Diagnosis not present

## 2024-04-10 DIAGNOSIS — N1831 Chronic kidney disease, stage 3a: Secondary | ICD-10-CM | POA: Diagnosis not present

## 2024-04-10 DIAGNOSIS — E785 Hyperlipidemia, unspecified: Secondary | ICD-10-CM | POA: Diagnosis not present

## 2024-04-10 DIAGNOSIS — K219 Gastro-esophageal reflux disease without esophagitis: Secondary | ICD-10-CM | POA: Diagnosis not present

## 2024-04-10 DIAGNOSIS — E43 Unspecified severe protein-calorie malnutrition: Secondary | ICD-10-CM | POA: Diagnosis not present

## 2024-04-10 DIAGNOSIS — Z8719 Personal history of other diseases of the digestive system: Secondary | ICD-10-CM | POA: Diagnosis not present

## 2024-04-10 DIAGNOSIS — R296 Repeated falls: Secondary | ICD-10-CM | POA: Diagnosis not present

## 2024-04-10 DIAGNOSIS — Z6820 Body mass index (BMI) 20.0-20.9, adult: Secondary | ICD-10-CM | POA: Diagnosis not present

## 2024-04-11 DIAGNOSIS — Z6820 Body mass index (BMI) 20.0-20.9, adult: Secondary | ICD-10-CM | POA: Diagnosis not present

## 2024-04-11 DIAGNOSIS — R296 Repeated falls: Secondary | ICD-10-CM | POA: Diagnosis not present

## 2024-04-11 DIAGNOSIS — K219 Gastro-esophageal reflux disease without esophagitis: Secondary | ICD-10-CM | POA: Diagnosis not present

## 2024-04-11 DIAGNOSIS — Z8719 Personal history of other diseases of the digestive system: Secondary | ICD-10-CM | POA: Diagnosis not present

## 2024-04-11 DIAGNOSIS — G311 Senile degeneration of brain, not elsewhere classified: Secondary | ICD-10-CM | POA: Diagnosis not present

## 2024-04-11 DIAGNOSIS — E43 Unspecified severe protein-calorie malnutrition: Secondary | ICD-10-CM | POA: Diagnosis not present

## 2024-04-12 DIAGNOSIS — Z6820 Body mass index (BMI) 20.0-20.9, adult: Secondary | ICD-10-CM | POA: Diagnosis not present

## 2024-04-12 DIAGNOSIS — Z8719 Personal history of other diseases of the digestive system: Secondary | ICD-10-CM | POA: Diagnosis not present

## 2024-04-12 DIAGNOSIS — E43 Unspecified severe protein-calorie malnutrition: Secondary | ICD-10-CM | POA: Diagnosis not present

## 2024-04-12 DIAGNOSIS — R296 Repeated falls: Secondary | ICD-10-CM | POA: Diagnosis not present

## 2024-04-12 DIAGNOSIS — G311 Senile degeneration of brain, not elsewhere classified: Secondary | ICD-10-CM | POA: Diagnosis not present

## 2024-04-12 DIAGNOSIS — K219 Gastro-esophageal reflux disease without esophagitis: Secondary | ICD-10-CM | POA: Diagnosis not present

## 2024-04-13 DIAGNOSIS — R296 Repeated falls: Secondary | ICD-10-CM | POA: Diagnosis not present

## 2024-04-13 DIAGNOSIS — G311 Senile degeneration of brain, not elsewhere classified: Secondary | ICD-10-CM | POA: Diagnosis not present

## 2024-04-13 DIAGNOSIS — Z8719 Personal history of other diseases of the digestive system: Secondary | ICD-10-CM | POA: Diagnosis not present

## 2024-04-13 DIAGNOSIS — K219 Gastro-esophageal reflux disease without esophagitis: Secondary | ICD-10-CM | POA: Diagnosis not present

## 2024-04-13 DIAGNOSIS — E43 Unspecified severe protein-calorie malnutrition: Secondary | ICD-10-CM | POA: Diagnosis not present

## 2024-04-13 DIAGNOSIS — Z6820 Body mass index (BMI) 20.0-20.9, adult: Secondary | ICD-10-CM | POA: Diagnosis not present

## 2024-04-14 DIAGNOSIS — E43 Unspecified severe protein-calorie malnutrition: Secondary | ICD-10-CM | POA: Diagnosis not present

## 2024-04-14 DIAGNOSIS — Z8719 Personal history of other diseases of the digestive system: Secondary | ICD-10-CM | POA: Diagnosis not present

## 2024-04-14 DIAGNOSIS — R296 Repeated falls: Secondary | ICD-10-CM | POA: Diagnosis not present

## 2024-04-14 DIAGNOSIS — G311 Senile degeneration of brain, not elsewhere classified: Secondary | ICD-10-CM | POA: Diagnosis not present

## 2024-04-14 DIAGNOSIS — Z6820 Body mass index (BMI) 20.0-20.9, adult: Secondary | ICD-10-CM | POA: Diagnosis not present

## 2024-04-14 DIAGNOSIS — K219 Gastro-esophageal reflux disease without esophagitis: Secondary | ICD-10-CM | POA: Diagnosis not present

## 2024-04-18 DIAGNOSIS — K219 Gastro-esophageal reflux disease without esophagitis: Secondary | ICD-10-CM | POA: Diagnosis not present

## 2024-04-18 DIAGNOSIS — R296 Repeated falls: Secondary | ICD-10-CM | POA: Diagnosis not present

## 2024-04-18 DIAGNOSIS — G311 Senile degeneration of brain, not elsewhere classified: Secondary | ICD-10-CM | POA: Diagnosis not present

## 2024-04-18 DIAGNOSIS — Z6820 Body mass index (BMI) 20.0-20.9, adult: Secondary | ICD-10-CM | POA: Diagnosis not present

## 2024-04-18 DIAGNOSIS — E43 Unspecified severe protein-calorie malnutrition: Secondary | ICD-10-CM | POA: Diagnosis not present

## 2024-04-18 DIAGNOSIS — Z8719 Personal history of other diseases of the digestive system: Secondary | ICD-10-CM | POA: Diagnosis not present

## 2024-04-19 DIAGNOSIS — R41841 Cognitive communication deficit: Secondary | ICD-10-CM | POA: Diagnosis not present

## 2024-04-19 DIAGNOSIS — I10 Essential (primary) hypertension: Secondary | ICD-10-CM | POA: Diagnosis not present

## 2024-04-19 DIAGNOSIS — D649 Anemia, unspecified: Secondary | ICD-10-CM | POA: Diagnosis not present

## 2024-04-19 DIAGNOSIS — N390 Urinary tract infection, site not specified: Secondary | ICD-10-CM | POA: Diagnosis not present

## 2024-04-19 DIAGNOSIS — T7840XA Allergy, unspecified, initial encounter: Secondary | ICD-10-CM | POA: Diagnosis not present

## 2024-04-19 DIAGNOSIS — R293 Abnormal posture: Secondary | ICD-10-CM | POA: Diagnosis not present

## 2024-04-19 DIAGNOSIS — R1311 Dysphagia, oral phase: Secondary | ICD-10-CM | POA: Diagnosis not present

## 2024-04-19 DIAGNOSIS — E785 Hyperlipidemia, unspecified: Secondary | ICD-10-CM | POA: Diagnosis not present

## 2024-04-19 DIAGNOSIS — M810 Age-related osteoporosis without current pathological fracture: Secondary | ICD-10-CM | POA: Diagnosis not present

## 2024-04-19 DIAGNOSIS — R569 Unspecified convulsions: Secondary | ICD-10-CM | POA: Diagnosis not present

## 2024-04-19 DIAGNOSIS — F0390 Unspecified dementia without behavioral disturbance: Secondary | ICD-10-CM | POA: Diagnosis not present

## 2024-04-19 DIAGNOSIS — F331 Major depressive disorder, recurrent, moderate: Secondary | ICD-10-CM | POA: Diagnosis not present

## 2024-04-20 DIAGNOSIS — R296 Repeated falls: Secondary | ICD-10-CM | POA: Diagnosis not present

## 2024-04-20 DIAGNOSIS — K219 Gastro-esophageal reflux disease without esophagitis: Secondary | ICD-10-CM | POA: Diagnosis not present

## 2024-04-20 DIAGNOSIS — E43 Unspecified severe protein-calorie malnutrition: Secondary | ICD-10-CM | POA: Diagnosis not present

## 2024-04-20 DIAGNOSIS — Z8719 Personal history of other diseases of the digestive system: Secondary | ICD-10-CM | POA: Diagnosis not present

## 2024-04-20 DIAGNOSIS — G311 Senile degeneration of brain, not elsewhere classified: Secondary | ICD-10-CM | POA: Diagnosis not present

## 2024-04-20 DIAGNOSIS — Z6820 Body mass index (BMI) 20.0-20.9, adult: Secondary | ICD-10-CM | POA: Diagnosis not present

## 2024-04-21 DIAGNOSIS — Z8719 Personal history of other diseases of the digestive system: Secondary | ICD-10-CM | POA: Diagnosis not present

## 2024-04-21 DIAGNOSIS — R296 Repeated falls: Secondary | ICD-10-CM | POA: Diagnosis not present

## 2024-04-21 DIAGNOSIS — G311 Senile degeneration of brain, not elsewhere classified: Secondary | ICD-10-CM | POA: Diagnosis not present

## 2024-04-21 DIAGNOSIS — Z6820 Body mass index (BMI) 20.0-20.9, adult: Secondary | ICD-10-CM | POA: Diagnosis not present

## 2024-04-21 DIAGNOSIS — K219 Gastro-esophageal reflux disease without esophagitis: Secondary | ICD-10-CM | POA: Diagnosis not present

## 2024-04-21 DIAGNOSIS — E43 Unspecified severe protein-calorie malnutrition: Secondary | ICD-10-CM | POA: Diagnosis not present

## 2024-04-22 DIAGNOSIS — E43 Unspecified severe protein-calorie malnutrition: Secondary | ICD-10-CM | POA: Diagnosis not present

## 2024-04-22 DIAGNOSIS — Z8719 Personal history of other diseases of the digestive system: Secondary | ICD-10-CM | POA: Diagnosis not present

## 2024-04-22 DIAGNOSIS — R296 Repeated falls: Secondary | ICD-10-CM | POA: Diagnosis not present

## 2024-04-22 DIAGNOSIS — K219 Gastro-esophageal reflux disease without esophagitis: Secondary | ICD-10-CM | POA: Diagnosis not present

## 2024-04-22 DIAGNOSIS — Z6820 Body mass index (BMI) 20.0-20.9, adult: Secondary | ICD-10-CM | POA: Diagnosis not present

## 2024-04-22 DIAGNOSIS — G311 Senile degeneration of brain, not elsewhere classified: Secondary | ICD-10-CM | POA: Diagnosis not present

## 2024-04-25 DIAGNOSIS — R296 Repeated falls: Secondary | ICD-10-CM | POA: Diagnosis not present

## 2024-04-25 DIAGNOSIS — K219 Gastro-esophageal reflux disease without esophagitis: Secondary | ICD-10-CM | POA: Diagnosis not present

## 2024-04-25 DIAGNOSIS — E43 Unspecified severe protein-calorie malnutrition: Secondary | ICD-10-CM | POA: Diagnosis not present

## 2024-04-25 DIAGNOSIS — Z6820 Body mass index (BMI) 20.0-20.9, adult: Secondary | ICD-10-CM | POA: Diagnosis not present

## 2024-04-25 DIAGNOSIS — G311 Senile degeneration of brain, not elsewhere classified: Secondary | ICD-10-CM | POA: Diagnosis not present

## 2024-04-25 DIAGNOSIS — Z8719 Personal history of other diseases of the digestive system: Secondary | ICD-10-CM | POA: Diagnosis not present

## 2024-04-27 DIAGNOSIS — M810 Age-related osteoporosis without current pathological fracture: Secondary | ICD-10-CM | POA: Diagnosis not present

## 2024-04-27 DIAGNOSIS — E785 Hyperlipidemia, unspecified: Secondary | ICD-10-CM | POA: Diagnosis not present

## 2024-04-27 DIAGNOSIS — G311 Senile degeneration of brain, not elsewhere classified: Secondary | ICD-10-CM | POA: Diagnosis not present

## 2024-04-27 DIAGNOSIS — N390 Urinary tract infection, site not specified: Secondary | ICD-10-CM | POA: Diagnosis not present

## 2024-04-27 DIAGNOSIS — L602 Onychogryphosis: Secondary | ICD-10-CM | POA: Diagnosis not present

## 2024-04-27 DIAGNOSIS — I10 Essential (primary) hypertension: Secondary | ICD-10-CM | POA: Diagnosis not present

## 2024-04-27 DIAGNOSIS — F331 Major depressive disorder, recurrent, moderate: Secondary | ICD-10-CM | POA: Diagnosis not present

## 2024-04-27 DIAGNOSIS — D649 Anemia, unspecified: Secondary | ICD-10-CM | POA: Diagnosis not present

## 2024-04-27 DIAGNOSIS — R296 Repeated falls: Secondary | ICD-10-CM | POA: Diagnosis not present

## 2024-04-27 DIAGNOSIS — R1311 Dysphagia, oral phase: Secondary | ICD-10-CM | POA: Diagnosis not present

## 2024-04-27 DIAGNOSIS — Z8719 Personal history of other diseases of the digestive system: Secondary | ICD-10-CM | POA: Diagnosis not present

## 2024-04-27 DIAGNOSIS — T7840XA Allergy, unspecified, initial encounter: Secondary | ICD-10-CM | POA: Diagnosis not present

## 2024-04-27 DIAGNOSIS — Z6820 Body mass index (BMI) 20.0-20.9, adult: Secondary | ICD-10-CM | POA: Diagnosis not present

## 2024-04-27 DIAGNOSIS — I739 Peripheral vascular disease, unspecified: Secondary | ICD-10-CM | POA: Diagnosis not present

## 2024-04-27 DIAGNOSIS — K219 Gastro-esophageal reflux disease without esophagitis: Secondary | ICD-10-CM | POA: Diagnosis not present

## 2024-04-27 DIAGNOSIS — L603 Nail dystrophy: Secondary | ICD-10-CM | POA: Diagnosis not present

## 2024-04-27 DIAGNOSIS — R569 Unspecified convulsions: Secondary | ICD-10-CM | POA: Diagnosis not present

## 2024-04-27 DIAGNOSIS — R41841 Cognitive communication deficit: Secondary | ICD-10-CM | POA: Diagnosis not present

## 2024-04-27 DIAGNOSIS — R293 Abnormal posture: Secondary | ICD-10-CM | POA: Diagnosis not present

## 2024-04-27 DIAGNOSIS — F0390 Unspecified dementia without behavioral disturbance: Secondary | ICD-10-CM | POA: Diagnosis not present

## 2024-04-27 DIAGNOSIS — E43 Unspecified severe protein-calorie malnutrition: Secondary | ICD-10-CM | POA: Diagnosis not present

## 2024-04-28 DIAGNOSIS — Z8719 Personal history of other diseases of the digestive system: Secondary | ICD-10-CM | POA: Diagnosis not present

## 2024-04-28 DIAGNOSIS — E43 Unspecified severe protein-calorie malnutrition: Secondary | ICD-10-CM | POA: Diagnosis not present

## 2024-04-28 DIAGNOSIS — K219 Gastro-esophageal reflux disease without esophagitis: Secondary | ICD-10-CM | POA: Diagnosis not present

## 2024-04-28 DIAGNOSIS — G311 Senile degeneration of brain, not elsewhere classified: Secondary | ICD-10-CM | POA: Diagnosis not present

## 2024-04-28 DIAGNOSIS — Z6820 Body mass index (BMI) 20.0-20.9, adult: Secondary | ICD-10-CM | POA: Diagnosis not present

## 2024-04-28 DIAGNOSIS — R296 Repeated falls: Secondary | ICD-10-CM | POA: Diagnosis not present

## 2024-04-29 DIAGNOSIS — Z8719 Personal history of other diseases of the digestive system: Secondary | ICD-10-CM | POA: Diagnosis not present

## 2024-04-29 DIAGNOSIS — E43 Unspecified severe protein-calorie malnutrition: Secondary | ICD-10-CM | POA: Diagnosis not present

## 2024-04-29 DIAGNOSIS — R296 Repeated falls: Secondary | ICD-10-CM | POA: Diagnosis not present

## 2024-04-29 DIAGNOSIS — Z6820 Body mass index (BMI) 20.0-20.9, adult: Secondary | ICD-10-CM | POA: Diagnosis not present

## 2024-04-29 DIAGNOSIS — K219 Gastro-esophageal reflux disease without esophagitis: Secondary | ICD-10-CM | POA: Diagnosis not present

## 2024-04-29 DIAGNOSIS — G311 Senile degeneration of brain, not elsewhere classified: Secondary | ICD-10-CM | POA: Diagnosis not present

## 2024-05-01 DIAGNOSIS — T7840XA Allergy, unspecified, initial encounter: Secondary | ICD-10-CM | POA: Diagnosis not present

## 2024-05-01 DIAGNOSIS — R569 Unspecified convulsions: Secondary | ICD-10-CM | POA: Diagnosis not present

## 2024-05-01 DIAGNOSIS — E785 Hyperlipidemia, unspecified: Secondary | ICD-10-CM | POA: Diagnosis not present

## 2024-05-01 DIAGNOSIS — G63 Polyneuropathy in diseases classified elsewhere: Secondary | ICD-10-CM | POA: Diagnosis not present

## 2024-05-01 DIAGNOSIS — R293 Abnormal posture: Secondary | ICD-10-CM | POA: Diagnosis not present

## 2024-05-01 DIAGNOSIS — D649 Anemia, unspecified: Secondary | ICD-10-CM | POA: Diagnosis not present

## 2024-05-01 DIAGNOSIS — F331 Major depressive disorder, recurrent, moderate: Secondary | ICD-10-CM | POA: Diagnosis not present

## 2024-05-01 DIAGNOSIS — R1311 Dysphagia, oral phase: Secondary | ICD-10-CM | POA: Diagnosis not present

## 2024-05-01 DIAGNOSIS — F0390 Unspecified dementia without behavioral disturbance: Secondary | ICD-10-CM | POA: Diagnosis not present

## 2024-05-01 DIAGNOSIS — N390 Urinary tract infection, site not specified: Secondary | ICD-10-CM | POA: Diagnosis not present

## 2024-05-01 DIAGNOSIS — R41841 Cognitive communication deficit: Secondary | ICD-10-CM | POA: Diagnosis not present

## 2024-05-01 DIAGNOSIS — M810 Age-related osteoporosis without current pathological fracture: Secondary | ICD-10-CM | POA: Diagnosis not present

## 2024-05-03 DIAGNOSIS — E43 Unspecified severe protein-calorie malnutrition: Secondary | ICD-10-CM | POA: Diagnosis not present

## 2024-05-03 DIAGNOSIS — Z6820 Body mass index (BMI) 20.0-20.9, adult: Secondary | ICD-10-CM | POA: Diagnosis not present

## 2024-05-03 DIAGNOSIS — K219 Gastro-esophageal reflux disease without esophagitis: Secondary | ICD-10-CM | POA: Diagnosis not present

## 2024-05-03 DIAGNOSIS — G311 Senile degeneration of brain, not elsewhere classified: Secondary | ICD-10-CM | POA: Diagnosis not present

## 2024-05-03 DIAGNOSIS — R296 Repeated falls: Secondary | ICD-10-CM | POA: Diagnosis not present

## 2024-05-03 DIAGNOSIS — Z8719 Personal history of other diseases of the digestive system: Secondary | ICD-10-CM | POA: Diagnosis not present

## 2024-05-04 DIAGNOSIS — Z6820 Body mass index (BMI) 20.0-20.9, adult: Secondary | ICD-10-CM | POA: Diagnosis not present

## 2024-05-04 DIAGNOSIS — Z8719 Personal history of other diseases of the digestive system: Secondary | ICD-10-CM | POA: Diagnosis not present

## 2024-05-04 DIAGNOSIS — E43 Unspecified severe protein-calorie malnutrition: Secondary | ICD-10-CM | POA: Diagnosis not present

## 2024-05-04 DIAGNOSIS — G311 Senile degeneration of brain, not elsewhere classified: Secondary | ICD-10-CM | POA: Diagnosis not present

## 2024-05-04 DIAGNOSIS — R296 Repeated falls: Secondary | ICD-10-CM | POA: Diagnosis not present

## 2024-05-04 DIAGNOSIS — K219 Gastro-esophageal reflux disease without esophagitis: Secondary | ICD-10-CM | POA: Diagnosis not present

## 2024-05-05 DIAGNOSIS — R296 Repeated falls: Secondary | ICD-10-CM | POA: Diagnosis not present

## 2024-05-05 DIAGNOSIS — G311 Senile degeneration of brain, not elsewhere classified: Secondary | ICD-10-CM | POA: Diagnosis not present

## 2024-05-05 DIAGNOSIS — Z6820 Body mass index (BMI) 20.0-20.9, adult: Secondary | ICD-10-CM | POA: Diagnosis not present

## 2024-05-05 DIAGNOSIS — E43 Unspecified severe protein-calorie malnutrition: Secondary | ICD-10-CM | POA: Diagnosis not present

## 2024-05-05 DIAGNOSIS — Z8719 Personal history of other diseases of the digestive system: Secondary | ICD-10-CM | POA: Diagnosis not present

## 2024-05-05 DIAGNOSIS — K219 Gastro-esophageal reflux disease without esophagitis: Secondary | ICD-10-CM | POA: Diagnosis not present

## 2024-05-06 DIAGNOSIS — G311 Senile degeneration of brain, not elsewhere classified: Secondary | ICD-10-CM | POA: Diagnosis not present

## 2024-05-06 DIAGNOSIS — K219 Gastro-esophageal reflux disease without esophagitis: Secondary | ICD-10-CM | POA: Diagnosis not present

## 2024-05-06 DIAGNOSIS — Z8719 Personal history of other diseases of the digestive system: Secondary | ICD-10-CM | POA: Diagnosis not present

## 2024-05-06 DIAGNOSIS — E43 Unspecified severe protein-calorie malnutrition: Secondary | ICD-10-CM | POA: Diagnosis not present

## 2024-05-06 DIAGNOSIS — R296 Repeated falls: Secondary | ICD-10-CM | POA: Diagnosis not present

## 2024-05-06 DIAGNOSIS — Z6820 Body mass index (BMI) 20.0-20.9, adult: Secondary | ICD-10-CM | POA: Diagnosis not present

## 2024-05-09 DIAGNOSIS — Z8719 Personal history of other diseases of the digestive system: Secondary | ICD-10-CM | POA: Diagnosis not present

## 2024-05-09 DIAGNOSIS — G311 Senile degeneration of brain, not elsewhere classified: Secondary | ICD-10-CM | POA: Diagnosis not present

## 2024-05-09 DIAGNOSIS — E43 Unspecified severe protein-calorie malnutrition: Secondary | ICD-10-CM | POA: Diagnosis not present

## 2024-05-09 DIAGNOSIS — R296 Repeated falls: Secondary | ICD-10-CM | POA: Diagnosis not present

## 2024-05-09 DIAGNOSIS — Z6820 Body mass index (BMI) 20.0-20.9, adult: Secondary | ICD-10-CM | POA: Diagnosis not present

## 2024-05-09 DIAGNOSIS — K219 Gastro-esophageal reflux disease without esophagitis: Secondary | ICD-10-CM | POA: Diagnosis not present

## 2024-05-10 DIAGNOSIS — I129 Hypertensive chronic kidney disease with stage 1 through stage 4 chronic kidney disease, or unspecified chronic kidney disease: Secondary | ICD-10-CM | POA: Diagnosis not present

## 2024-05-10 DIAGNOSIS — R296 Repeated falls: Secondary | ICD-10-CM | POA: Diagnosis not present

## 2024-05-10 DIAGNOSIS — E43 Unspecified severe protein-calorie malnutrition: Secondary | ICD-10-CM | POA: Diagnosis not present

## 2024-05-10 DIAGNOSIS — K219 Gastro-esophageal reflux disease without esophagitis: Secondary | ICD-10-CM | POA: Diagnosis not present

## 2024-05-10 DIAGNOSIS — E785 Hyperlipidemia, unspecified: Secondary | ICD-10-CM | POA: Diagnosis not present

## 2024-05-10 DIAGNOSIS — D631 Anemia in chronic kidney disease: Secondary | ICD-10-CM | POA: Diagnosis not present

## 2024-05-10 DIAGNOSIS — Z8719 Personal history of other diseases of the digestive system: Secondary | ICD-10-CM | POA: Diagnosis not present

## 2024-05-10 DIAGNOSIS — Z87440 Personal history of urinary (tract) infections: Secondary | ICD-10-CM | POA: Diagnosis not present

## 2024-05-10 DIAGNOSIS — I4891 Unspecified atrial fibrillation: Secondary | ICD-10-CM | POA: Diagnosis not present

## 2024-05-10 DIAGNOSIS — G311 Senile degeneration of brain, not elsewhere classified: Secondary | ICD-10-CM | POA: Diagnosis not present

## 2024-05-10 DIAGNOSIS — Z6820 Body mass index (BMI) 20.0-20.9, adult: Secondary | ICD-10-CM | POA: Diagnosis not present

## 2024-05-10 DIAGNOSIS — N1831 Chronic kidney disease, stage 3a: Secondary | ICD-10-CM | POA: Diagnosis not present

## 2024-05-11 DIAGNOSIS — R296 Repeated falls: Secondary | ICD-10-CM | POA: Diagnosis not present

## 2024-05-11 DIAGNOSIS — Z6820 Body mass index (BMI) 20.0-20.9, adult: Secondary | ICD-10-CM | POA: Diagnosis not present

## 2024-05-11 DIAGNOSIS — G311 Senile degeneration of brain, not elsewhere classified: Secondary | ICD-10-CM | POA: Diagnosis not present

## 2024-05-11 DIAGNOSIS — K219 Gastro-esophageal reflux disease without esophagitis: Secondary | ICD-10-CM | POA: Diagnosis not present

## 2024-05-11 DIAGNOSIS — Z8719 Personal history of other diseases of the digestive system: Secondary | ICD-10-CM | POA: Diagnosis not present

## 2024-05-11 DIAGNOSIS — E43 Unspecified severe protein-calorie malnutrition: Secondary | ICD-10-CM | POA: Diagnosis not present

## 2024-05-13 DIAGNOSIS — R296 Repeated falls: Secondary | ICD-10-CM | POA: Diagnosis not present

## 2024-05-13 DIAGNOSIS — Z6820 Body mass index (BMI) 20.0-20.9, adult: Secondary | ICD-10-CM | POA: Diagnosis not present

## 2024-05-13 DIAGNOSIS — Z8719 Personal history of other diseases of the digestive system: Secondary | ICD-10-CM | POA: Diagnosis not present

## 2024-05-13 DIAGNOSIS — G311 Senile degeneration of brain, not elsewhere classified: Secondary | ICD-10-CM | POA: Diagnosis not present

## 2024-05-13 DIAGNOSIS — K219 Gastro-esophageal reflux disease without esophagitis: Secondary | ICD-10-CM | POA: Diagnosis not present

## 2024-05-13 DIAGNOSIS — E43 Unspecified severe protein-calorie malnutrition: Secondary | ICD-10-CM | POA: Diagnosis not present

## 2024-05-16 DIAGNOSIS — R296 Repeated falls: Secondary | ICD-10-CM | POA: Diagnosis not present

## 2024-05-16 DIAGNOSIS — Z6820 Body mass index (BMI) 20.0-20.9, adult: Secondary | ICD-10-CM | POA: Diagnosis not present

## 2024-05-16 DIAGNOSIS — G311 Senile degeneration of brain, not elsewhere classified: Secondary | ICD-10-CM | POA: Diagnosis not present

## 2024-05-16 DIAGNOSIS — H10503 Unspecified blepharoconjunctivitis, bilateral: Secondary | ICD-10-CM | POA: Diagnosis not present

## 2024-05-16 DIAGNOSIS — K219 Gastro-esophageal reflux disease without esophagitis: Secondary | ICD-10-CM | POA: Diagnosis not present

## 2024-05-16 DIAGNOSIS — H401134 Primary open-angle glaucoma, bilateral, indeterminate stage: Secondary | ICD-10-CM | POA: Diagnosis not present

## 2024-05-16 DIAGNOSIS — E43 Unspecified severe protein-calorie malnutrition: Secondary | ICD-10-CM | POA: Diagnosis not present

## 2024-05-16 DIAGNOSIS — Z8719 Personal history of other diseases of the digestive system: Secondary | ICD-10-CM | POA: Diagnosis not present

## 2024-05-18 DIAGNOSIS — E43 Unspecified severe protein-calorie malnutrition: Secondary | ICD-10-CM | POA: Diagnosis not present

## 2024-05-18 DIAGNOSIS — G311 Senile degeneration of brain, not elsewhere classified: Secondary | ICD-10-CM | POA: Diagnosis not present

## 2024-05-18 DIAGNOSIS — Z6820 Body mass index (BMI) 20.0-20.9, adult: Secondary | ICD-10-CM | POA: Diagnosis not present

## 2024-05-18 DIAGNOSIS — K219 Gastro-esophageal reflux disease without esophagitis: Secondary | ICD-10-CM | POA: Diagnosis not present

## 2024-05-18 DIAGNOSIS — Z8719 Personal history of other diseases of the digestive system: Secondary | ICD-10-CM | POA: Diagnosis not present

## 2024-05-18 DIAGNOSIS — R296 Repeated falls: Secondary | ICD-10-CM | POA: Diagnosis not present

## 2024-05-19 DIAGNOSIS — G311 Senile degeneration of brain, not elsewhere classified: Secondary | ICD-10-CM | POA: Diagnosis not present

## 2024-05-19 DIAGNOSIS — Z6820 Body mass index (BMI) 20.0-20.9, adult: Secondary | ICD-10-CM | POA: Diagnosis not present

## 2024-05-19 DIAGNOSIS — E43 Unspecified severe protein-calorie malnutrition: Secondary | ICD-10-CM | POA: Diagnosis not present

## 2024-05-19 DIAGNOSIS — Z8719 Personal history of other diseases of the digestive system: Secondary | ICD-10-CM | POA: Diagnosis not present

## 2024-05-19 DIAGNOSIS — K219 Gastro-esophageal reflux disease without esophagitis: Secondary | ICD-10-CM | POA: Diagnosis not present

## 2024-05-19 DIAGNOSIS — R296 Repeated falls: Secondary | ICD-10-CM | POA: Diagnosis not present

## 2024-05-20 DIAGNOSIS — Z6820 Body mass index (BMI) 20.0-20.9, adult: Secondary | ICD-10-CM | POA: Diagnosis not present

## 2024-05-20 DIAGNOSIS — R296 Repeated falls: Secondary | ICD-10-CM | POA: Diagnosis not present

## 2024-05-20 DIAGNOSIS — G311 Senile degeneration of brain, not elsewhere classified: Secondary | ICD-10-CM | POA: Diagnosis not present

## 2024-05-20 DIAGNOSIS — E43 Unspecified severe protein-calorie malnutrition: Secondary | ICD-10-CM | POA: Diagnosis not present

## 2024-05-20 DIAGNOSIS — K219 Gastro-esophageal reflux disease without esophagitis: Secondary | ICD-10-CM | POA: Diagnosis not present

## 2024-05-20 DIAGNOSIS — Z8719 Personal history of other diseases of the digestive system: Secondary | ICD-10-CM | POA: Diagnosis not present

## 2024-05-23 DIAGNOSIS — K219 Gastro-esophageal reflux disease without esophagitis: Secondary | ICD-10-CM | POA: Diagnosis not present

## 2024-05-23 DIAGNOSIS — Z6820 Body mass index (BMI) 20.0-20.9, adult: Secondary | ICD-10-CM | POA: Diagnosis not present

## 2024-05-23 DIAGNOSIS — Z8719 Personal history of other diseases of the digestive system: Secondary | ICD-10-CM | POA: Diagnosis not present

## 2024-05-23 DIAGNOSIS — G311 Senile degeneration of brain, not elsewhere classified: Secondary | ICD-10-CM | POA: Diagnosis not present

## 2024-05-23 DIAGNOSIS — R296 Repeated falls: Secondary | ICD-10-CM | POA: Diagnosis not present

## 2024-05-23 DIAGNOSIS — E43 Unspecified severe protein-calorie malnutrition: Secondary | ICD-10-CM | POA: Diagnosis not present

## 2024-05-25 DIAGNOSIS — R296 Repeated falls: Secondary | ICD-10-CM | POA: Diagnosis not present

## 2024-05-25 DIAGNOSIS — K219 Gastro-esophageal reflux disease without esophagitis: Secondary | ICD-10-CM | POA: Diagnosis not present

## 2024-05-25 DIAGNOSIS — E43 Unspecified severe protein-calorie malnutrition: Secondary | ICD-10-CM | POA: Diagnosis not present

## 2024-05-25 DIAGNOSIS — Z6820 Body mass index (BMI) 20.0-20.9, adult: Secondary | ICD-10-CM | POA: Diagnosis not present

## 2024-05-25 DIAGNOSIS — G311 Senile degeneration of brain, not elsewhere classified: Secondary | ICD-10-CM | POA: Diagnosis not present

## 2024-05-25 DIAGNOSIS — Z8719 Personal history of other diseases of the digestive system: Secondary | ICD-10-CM | POA: Diagnosis not present

## 2024-05-27 DIAGNOSIS — E43 Unspecified severe protein-calorie malnutrition: Secondary | ICD-10-CM | POA: Diagnosis not present

## 2024-05-27 DIAGNOSIS — F0390 Unspecified dementia without behavioral disturbance: Secondary | ICD-10-CM | POA: Diagnosis not present

## 2024-05-27 DIAGNOSIS — E785 Hyperlipidemia, unspecified: Secondary | ICD-10-CM | POA: Diagnosis not present

## 2024-05-27 DIAGNOSIS — F331 Major depressive disorder, recurrent, moderate: Secondary | ICD-10-CM | POA: Diagnosis not present

## 2024-05-27 DIAGNOSIS — M810 Age-related osteoporosis without current pathological fracture: Secondary | ICD-10-CM | POA: Diagnosis not present

## 2024-05-27 DIAGNOSIS — K219 Gastro-esophageal reflux disease without esophagitis: Secondary | ICD-10-CM | POA: Diagnosis not present

## 2024-05-27 DIAGNOSIS — R41841 Cognitive communication deficit: Secondary | ICD-10-CM | POA: Diagnosis not present

## 2024-05-27 DIAGNOSIS — R293 Abnormal posture: Secondary | ICD-10-CM | POA: Diagnosis not present

## 2024-05-27 DIAGNOSIS — I10 Essential (primary) hypertension: Secondary | ICD-10-CM | POA: Diagnosis not present

## 2024-05-27 DIAGNOSIS — N390 Urinary tract infection, site not specified: Secondary | ICD-10-CM | POA: Diagnosis not present

## 2024-05-27 DIAGNOSIS — T7840XA Allergy, unspecified, initial encounter: Secondary | ICD-10-CM | POA: Diagnosis not present

## 2024-05-27 DIAGNOSIS — Z6820 Body mass index (BMI) 20.0-20.9, adult: Secondary | ICD-10-CM | POA: Diagnosis not present

## 2024-05-27 DIAGNOSIS — R296 Repeated falls: Secondary | ICD-10-CM | POA: Diagnosis not present

## 2024-05-27 DIAGNOSIS — Z8719 Personal history of other diseases of the digestive system: Secondary | ICD-10-CM | POA: Diagnosis not present

## 2024-05-27 DIAGNOSIS — G311 Senile degeneration of brain, not elsewhere classified: Secondary | ICD-10-CM | POA: Diagnosis not present

## 2024-05-27 DIAGNOSIS — R1311 Dysphagia, oral phase: Secondary | ICD-10-CM | POA: Diagnosis not present

## 2024-05-27 DIAGNOSIS — D649 Anemia, unspecified: Secondary | ICD-10-CM | POA: Diagnosis not present

## 2024-05-27 DIAGNOSIS — R569 Unspecified convulsions: Secondary | ICD-10-CM | POA: Diagnosis not present

## 2024-05-29 DIAGNOSIS — F331 Major depressive disorder, recurrent, moderate: Secondary | ICD-10-CM | POA: Diagnosis not present

## 2024-05-29 DIAGNOSIS — M810 Age-related osteoporosis without current pathological fracture: Secondary | ICD-10-CM | POA: Diagnosis not present

## 2024-05-29 DIAGNOSIS — T7840XA Allergy, unspecified, initial encounter: Secondary | ICD-10-CM | POA: Diagnosis not present

## 2024-05-29 DIAGNOSIS — R1311 Dysphagia, oral phase: Secondary | ICD-10-CM | POA: Diagnosis not present

## 2024-05-29 DIAGNOSIS — R41841 Cognitive communication deficit: Secondary | ICD-10-CM | POA: Diagnosis not present

## 2024-05-29 DIAGNOSIS — R293 Abnormal posture: Secondary | ICD-10-CM | POA: Diagnosis not present

## 2024-05-29 DIAGNOSIS — F0390 Unspecified dementia without behavioral disturbance: Secondary | ICD-10-CM | POA: Diagnosis not present

## 2024-05-29 DIAGNOSIS — R569 Unspecified convulsions: Secondary | ICD-10-CM | POA: Diagnosis not present

## 2024-05-29 DIAGNOSIS — D649 Anemia, unspecified: Secondary | ICD-10-CM | POA: Diagnosis not present

## 2024-05-29 DIAGNOSIS — E785 Hyperlipidemia, unspecified: Secondary | ICD-10-CM | POA: Diagnosis not present

## 2024-05-29 DIAGNOSIS — G63 Polyneuropathy in diseases classified elsewhere: Secondary | ICD-10-CM | POA: Diagnosis not present

## 2024-05-29 DIAGNOSIS — N390 Urinary tract infection, site not specified: Secondary | ICD-10-CM | POA: Diagnosis not present

## 2024-05-30 DIAGNOSIS — R296 Repeated falls: Secondary | ICD-10-CM | POA: Diagnosis not present

## 2024-05-30 DIAGNOSIS — G311 Senile degeneration of brain, not elsewhere classified: Secondary | ICD-10-CM | POA: Diagnosis not present

## 2024-05-30 DIAGNOSIS — K219 Gastro-esophageal reflux disease without esophagitis: Secondary | ICD-10-CM | POA: Diagnosis not present

## 2024-05-30 DIAGNOSIS — Z8719 Personal history of other diseases of the digestive system: Secondary | ICD-10-CM | POA: Diagnosis not present

## 2024-05-30 DIAGNOSIS — E43 Unspecified severe protein-calorie malnutrition: Secondary | ICD-10-CM | POA: Diagnosis not present

## 2024-05-30 DIAGNOSIS — Z6820 Body mass index (BMI) 20.0-20.9, adult: Secondary | ICD-10-CM | POA: Diagnosis not present

## 2024-05-31 DIAGNOSIS — E43 Unspecified severe protein-calorie malnutrition: Secondary | ICD-10-CM | POA: Diagnosis not present

## 2024-05-31 DIAGNOSIS — Z8719 Personal history of other diseases of the digestive system: Secondary | ICD-10-CM | POA: Diagnosis not present

## 2024-05-31 DIAGNOSIS — R296 Repeated falls: Secondary | ICD-10-CM | POA: Diagnosis not present

## 2024-05-31 DIAGNOSIS — Z6820 Body mass index (BMI) 20.0-20.9, adult: Secondary | ICD-10-CM | POA: Diagnosis not present

## 2024-05-31 DIAGNOSIS — G311 Senile degeneration of brain, not elsewhere classified: Secondary | ICD-10-CM | POA: Diagnosis not present

## 2024-05-31 DIAGNOSIS — K219 Gastro-esophageal reflux disease without esophagitis: Secondary | ICD-10-CM | POA: Diagnosis not present

## 2024-06-01 DIAGNOSIS — Z8719 Personal history of other diseases of the digestive system: Secondary | ICD-10-CM | POA: Diagnosis not present

## 2024-06-01 DIAGNOSIS — R296 Repeated falls: Secondary | ICD-10-CM | POA: Diagnosis not present

## 2024-06-01 DIAGNOSIS — E43 Unspecified severe protein-calorie malnutrition: Secondary | ICD-10-CM | POA: Diagnosis not present

## 2024-06-01 DIAGNOSIS — G311 Senile degeneration of brain, not elsewhere classified: Secondary | ICD-10-CM | POA: Diagnosis not present

## 2024-06-01 DIAGNOSIS — K219 Gastro-esophageal reflux disease without esophagitis: Secondary | ICD-10-CM | POA: Diagnosis not present

## 2024-06-01 DIAGNOSIS — Z6820 Body mass index (BMI) 20.0-20.9, adult: Secondary | ICD-10-CM | POA: Diagnosis not present

## 2024-06-02 DIAGNOSIS — R569 Unspecified convulsions: Secondary | ICD-10-CM | POA: Diagnosis not present

## 2024-06-02 DIAGNOSIS — I10 Essential (primary) hypertension: Secondary | ICD-10-CM | POA: Diagnosis not present

## 2024-06-02 DIAGNOSIS — F0390 Unspecified dementia without behavioral disturbance: Secondary | ICD-10-CM | POA: Diagnosis not present

## 2024-06-02 DIAGNOSIS — N390 Urinary tract infection, site not specified: Secondary | ICD-10-CM | POA: Diagnosis not present

## 2024-06-02 DIAGNOSIS — R1311 Dysphagia, oral phase: Secondary | ICD-10-CM | POA: Diagnosis not present

## 2024-06-02 DIAGNOSIS — E785 Hyperlipidemia, unspecified: Secondary | ICD-10-CM | POA: Diagnosis not present

## 2024-06-02 DIAGNOSIS — R293 Abnormal posture: Secondary | ICD-10-CM | POA: Diagnosis not present

## 2024-06-02 DIAGNOSIS — F331 Major depressive disorder, recurrent, moderate: Secondary | ICD-10-CM | POA: Diagnosis not present

## 2024-06-02 DIAGNOSIS — T7840XA Allergy, unspecified, initial encounter: Secondary | ICD-10-CM | POA: Diagnosis not present

## 2024-06-02 DIAGNOSIS — R41841 Cognitive communication deficit: Secondary | ICD-10-CM | POA: Diagnosis not present

## 2024-06-02 DIAGNOSIS — M810 Age-related osteoporosis without current pathological fracture: Secondary | ICD-10-CM | POA: Diagnosis not present

## 2024-06-02 DIAGNOSIS — D649 Anemia, unspecified: Secondary | ICD-10-CM | POA: Diagnosis not present

## 2024-06-03 DIAGNOSIS — R296 Repeated falls: Secondary | ICD-10-CM | POA: Diagnosis not present

## 2024-06-03 DIAGNOSIS — Z6820 Body mass index (BMI) 20.0-20.9, adult: Secondary | ICD-10-CM | POA: Diagnosis not present

## 2024-06-03 DIAGNOSIS — E43 Unspecified severe protein-calorie malnutrition: Secondary | ICD-10-CM | POA: Diagnosis not present

## 2024-06-03 DIAGNOSIS — Z8719 Personal history of other diseases of the digestive system: Secondary | ICD-10-CM | POA: Diagnosis not present

## 2024-06-03 DIAGNOSIS — G311 Senile degeneration of brain, not elsewhere classified: Secondary | ICD-10-CM | POA: Diagnosis not present

## 2024-06-03 DIAGNOSIS — K219 Gastro-esophageal reflux disease without esophagitis: Secondary | ICD-10-CM | POA: Diagnosis not present

## 2024-06-06 DIAGNOSIS — R296 Repeated falls: Secondary | ICD-10-CM | POA: Diagnosis not present

## 2024-06-06 DIAGNOSIS — Z6820 Body mass index (BMI) 20.0-20.9, adult: Secondary | ICD-10-CM | POA: Diagnosis not present

## 2024-06-06 DIAGNOSIS — K219 Gastro-esophageal reflux disease without esophagitis: Secondary | ICD-10-CM | POA: Diagnosis not present

## 2024-06-06 DIAGNOSIS — Z8719 Personal history of other diseases of the digestive system: Secondary | ICD-10-CM | POA: Diagnosis not present

## 2024-06-06 DIAGNOSIS — E43 Unspecified severe protein-calorie malnutrition: Secondary | ICD-10-CM | POA: Diagnosis not present

## 2024-06-06 DIAGNOSIS — G311 Senile degeneration of brain, not elsewhere classified: Secondary | ICD-10-CM | POA: Diagnosis not present

## 2024-06-07 DIAGNOSIS — E43 Unspecified severe protein-calorie malnutrition: Secondary | ICD-10-CM | POA: Diagnosis not present

## 2024-06-07 DIAGNOSIS — Z6820 Body mass index (BMI) 20.0-20.9, adult: Secondary | ICD-10-CM | POA: Diagnosis not present

## 2024-06-07 DIAGNOSIS — R296 Repeated falls: Secondary | ICD-10-CM | POA: Diagnosis not present

## 2024-06-07 DIAGNOSIS — G311 Senile degeneration of brain, not elsewhere classified: Secondary | ICD-10-CM | POA: Diagnosis not present

## 2024-06-07 DIAGNOSIS — Z8719 Personal history of other diseases of the digestive system: Secondary | ICD-10-CM | POA: Diagnosis not present

## 2024-06-07 DIAGNOSIS — K219 Gastro-esophageal reflux disease without esophagitis: Secondary | ICD-10-CM | POA: Diagnosis not present

## 2024-06-08 DIAGNOSIS — Z8719 Personal history of other diseases of the digestive system: Secondary | ICD-10-CM | POA: Diagnosis not present

## 2024-06-08 DIAGNOSIS — K219 Gastro-esophageal reflux disease without esophagitis: Secondary | ICD-10-CM | POA: Diagnosis not present

## 2024-06-08 DIAGNOSIS — R296 Repeated falls: Secondary | ICD-10-CM | POA: Diagnosis not present

## 2024-06-08 DIAGNOSIS — Z6820 Body mass index (BMI) 20.0-20.9, adult: Secondary | ICD-10-CM | POA: Diagnosis not present

## 2024-06-08 DIAGNOSIS — E43 Unspecified severe protein-calorie malnutrition: Secondary | ICD-10-CM | POA: Diagnosis not present

## 2024-06-08 DIAGNOSIS — G311 Senile degeneration of brain, not elsewhere classified: Secondary | ICD-10-CM | POA: Diagnosis not present

## 2024-06-22 DIAGNOSIS — R569 Unspecified convulsions: Secondary | ICD-10-CM | POA: Diagnosis not present

## 2024-06-22 DIAGNOSIS — D649 Anemia, unspecified: Secondary | ICD-10-CM | POA: Diagnosis not present

## 2024-06-22 DIAGNOSIS — N390 Urinary tract infection, site not specified: Secondary | ICD-10-CM | POA: Diagnosis not present

## 2024-06-22 DIAGNOSIS — R41841 Cognitive communication deficit: Secondary | ICD-10-CM | POA: Diagnosis not present

## 2024-06-22 DIAGNOSIS — T7840XA Allergy, unspecified, initial encounter: Secondary | ICD-10-CM | POA: Diagnosis not present

## 2024-06-22 DIAGNOSIS — R1311 Dysphagia, oral phase: Secondary | ICD-10-CM | POA: Diagnosis not present

## 2024-06-22 DIAGNOSIS — E785 Hyperlipidemia, unspecified: Secondary | ICD-10-CM | POA: Diagnosis not present

## 2024-06-22 DIAGNOSIS — F0390 Unspecified dementia without behavioral disturbance: Secondary | ICD-10-CM | POA: Diagnosis not present

## 2024-06-22 DIAGNOSIS — M810 Age-related osteoporosis without current pathological fracture: Secondary | ICD-10-CM | POA: Diagnosis not present

## 2024-06-22 DIAGNOSIS — F331 Major depressive disorder, recurrent, moderate: Secondary | ICD-10-CM | POA: Diagnosis not present

## 2024-06-22 DIAGNOSIS — R293 Abnormal posture: Secondary | ICD-10-CM | POA: Diagnosis not present

## 2024-06-22 DIAGNOSIS — G63 Polyneuropathy in diseases classified elsewhere: Secondary | ICD-10-CM | POA: Diagnosis not present

## 2024-11-10 DEATH — deceased
